# Patient Record
Sex: Male | Born: 1955 | Race: Black or African American | Hispanic: No | Marital: Single | Smoking: Former smoker
Health system: Southern US, Community
[De-identification: ages and names within clinical notes are randomized; demographics above are authoritative.]

## PROBLEM LIST (undated history)

## (undated) DIAGNOSIS — L89309 Pressure ulcer of unspecified buttock, unspecified stage: Secondary | ICD-10-CM

## (undated) DIAGNOSIS — I619 Nontraumatic intracerebral hemorrhage, unspecified: Secondary | ICD-10-CM

## (undated) DIAGNOSIS — U071 COVID-19: Secondary | ICD-10-CM

## (undated) DIAGNOSIS — E611 Iron deficiency: Secondary | ICD-10-CM

## (undated) DIAGNOSIS — Z931 Gastrostomy status: Secondary | ICD-10-CM

## (undated) DIAGNOSIS — K2100 Gastro-esophageal reflux disease with esophagitis, without bleeding: Secondary | ICD-10-CM

## (undated) DIAGNOSIS — IMO0002 Reserved for concepts with insufficient information to code with codable children: Secondary | ICD-10-CM

## (undated) DIAGNOSIS — I69122 Dysarthria following nontraumatic intracerebral hemorrhage: Secondary | ICD-10-CM

## (undated) DIAGNOSIS — F1090 Alcohol use, unspecified, uncomplicated: Secondary | ICD-10-CM

## (undated) DIAGNOSIS — I10 Essential (primary) hypertension: Secondary | ICD-10-CM

## (undated) DIAGNOSIS — R1312 Dysphagia, oropharyngeal phase: Secondary | ICD-10-CM

## (undated) DIAGNOSIS — Z720 Tobacco use: Secondary | ICD-10-CM

## (undated) DIAGNOSIS — Z789 Other specified health status: Secondary | ICD-10-CM

## (undated) DIAGNOSIS — B182 Chronic viral hepatitis C: Secondary | ICD-10-CM

## (undated) DIAGNOSIS — Z7289 Other problems related to lifestyle: Secondary | ICD-10-CM

## (undated) DIAGNOSIS — E1165 Type 2 diabetes mellitus with hyperglycemia: Secondary | ICD-10-CM

## (undated) DIAGNOSIS — G40823 Epileptic spasms, intractable, with status epilepticus: Secondary | ICD-10-CM

## (undated) DIAGNOSIS — M62838 Other muscle spasm: Secondary | ICD-10-CM

## (undated) DIAGNOSIS — R4701 Aphasia: Secondary | ICD-10-CM

## (undated) HISTORY — PX: FOOT FRACTURE SURGERY: SHX645

## (undated) NOTE — *Deleted (*Deleted)
Nutrition Follow-up  DOCUMENTATION CODES:   Severe malnutrition in context of social or environmental circumstances  INTERVENTION:    NUTRITION DIAGNOSIS:   Severe Malnutrition related to social / environmental circumstances as evidenced by severe fat depletion, severe muscle depletion.  ongoing  GOAL:   Patient will meet greater than or equal to 90% of their needs  Met with TF  MONITOR:   TF tolerance, Diet advancement, Weight trends, Skin  REASON FOR ASSESSMENT:   Consult, New TF Enteral/tube feeding initiation and management  ASSESSMENT:   34 yo male presents with acute onset of right sided weakness and mixed recept and expressive aphasia and admitted with acute left thalamocapsular ICH, severe HTN. PMH includes EtOH and tobacco abuse  10/06 Admitted 10/08 Cortrak placed(gastric) 10/13 repeat CT head showed stable L thalamic hemorrhageand intraventricular extension since 05/08/2020. Regional edema, mild regional mass-effect and mild lateral ventriculomegaly and trace SAH. 10/19 failed MBS 10/22 s/p PEG    Pt's hospital course has been complicated by recurrent blood loss anemia. GI following.   Pt continued to receive TF via PEG. Current orders are for Osmolite 1.2 @65ml /hr with free water Q4H. This provides 87 g of protein, 1872 kcals and 1264 mL of free water(2470ml total free water with flushes). Meets 100% estimated calorie and protein needs. Will continue with current nutrition plan of care and make adjustments as appropriate   UOP: x24 hours  Admit wt: 65.7 kg Current wt: 65.2 kg  Labs: CBGs 109-139 Medications: ss novolog, lantus 15 units daily, protonix, miralax, senokot-s  Diet Order:   Diet Order            DIET - DYS 1 Room service appropriate? No; Fluid consistency: Nectar Thick  Diet effective now                 EDUCATION NEEDS:   Not appropriate for education at this time  Skin:  Skin Assessment: Skin Integrity Issues:  Skin Integrity Issues:: Other (Comment) Other: puncture abdomen  Last BM:  11/1 type 7  Height:   Ht Readings from Last 1 Encounters:  04/09/20 5\' 6"  (1.676 m)    Weight:   Wt Readings from Last 1 Encounters:  06/02/20 65.2 kg    BMI:  Body mass index is 23.2 kg/m.  Estimated Nutritional Needs:   Kcal:  1780-1980 kcals  Protein:  85-95 g  Fluid:  >/= 1.8 L    ***

## (undated) NOTE — *Deleted (*Deleted)
STROKE TEAM PROGRESS NOTE   INTERVAL HISTORY RN at bedside.  Patient still has aphasia, left gaze and right hemiplegia.  CT repeat stable hematoma, however slightly increased midline shift and ventricular size.  We will keep patient in ICU today and repeat CT in a.m.  Vitals:   05/08/20 0300 05/08/20 0400 05/08/20 0500 05/08/20 0600  BP: (!) 157/60 (!) 163/60 (!) 160/60 (!) 148/64  Pulse: 69 71 93 70  Resp: 20 16 (!) 21 18  Temp:  (!) 100.6 F (38.1 C)    TempSrc:  Oral    SpO2: 98% 98% 97% 96%  Weight:   65.5 kg    CBC:  Recent Labs  Lab 05/05/20 2003 05/05/20 2003 05/06/20 0738 05/07/20 0337  WBC 8.8   < > 15.9* 16.2*  NEUTROABS 6.6  --  13.7*  --   HGB 12.3*   < > 11.6* 12.0*  HCT 40.8   < > 36.2* 38.3*  MCV 92.7   < > 91.6 92.1  PLT 149*   < > 154 136*   < > = values in this interval not displayed.   Basic Metabolic Panel:  Recent Labs  Lab 05/06/20 0738 05/07/20 0337 05/07/20 1012 05/07/20 1931  NA 137 136  --   --   K 3.9 3.4*  --   --   CL 106 104  --   --   CO2 20* 21*  --   --   GLUCOSE 151* 166*  --   --   BUN 10 9  --   --   CREATININE 0.97 0.81  --   --   CALCIUM 9.2 9.5  --   --   MG  --   --  1.6* 1.6*  PHOS  --   --  2.6 2.6   Lipid Panel:  Recent Labs  Lab 05/05/20 2003  CHOL 146  TRIG 87  HDL 49  CHOLHDL 3.0  VLDL 17  LDLCALC 80   HgbA1c:  Recent Labs  Lab 05/05/20 2004  HGBA1C 4.6*   Urine Drug Screen:  Recent Labs  Lab 05/06/20 0752  LABOPIA NONE DETECTED  COCAINSCRNUR POSITIVE*  LABBENZ NONE DETECTED  AMPHETMU NONE DETECTED  THCU NONE DETECTED  LABBARB NONE DETECTED    Alcohol Level  Recent Labs  Lab 05/05/20 2003  ETH <10    IMAGING past 24 hours CT HEAD WO CONTRAST  Result Date: 05/08/2020 CLINICAL DATA:  Follow-up examination for intracranial hemorrhage. EXAM: CT HEAD WITHOUT CONTRAST TECHNIQUE: Contiguous axial images were obtained from the base of the skull through the vertex without intravenous contrast.  COMPARISON:  Prior CT from 05/05/2020. FINDINGS: Brain: Examination moderately to severely degraded by motion artifact. Intraparenchymal hemorrhage centered at the left thalamus again seen, little interval changed in size and morphology as compared to previous exam. Surrounding low-density vasogenic edema with localized mass effect, similar. Associated left-to-right shift measures up to approximately 8 mm, little interval changed. Intraventricular extension with blood within the left greater than right lateral ventricles is overall similar. Associated ventricular size morphology is not significantly changed on this motion degraded exam. Scattered small volume subarachnoid hemorrhage now seen within the posterior right frontotemporal region, likely related to redistribution. No new acute intracranial hemorrhage. No acute large vessel territory infarct. No visible extra-axial fluid collection. No other new acute intracranial abnormality. Vascular: No appreciable hyperdense vessel or other abnormality on this motion degraded exam. Skull: Grossly stable allowing for motion. Sinuses/Orbits: Globes and orbital soft tissues  demonstrate no acute finding. Paranasal sinuses are clear. Nasogastric tube in place. Mastoid air cells are clear. Other: None. IMPRESSION: 1. Moderately to severely degraded exam due to motion artifact. 2. No significant interval changed in size and morphology of left thalamic hemorrhage with similar localized mass effect and 8 mm of left-to-right midline shift. 3. Similar intraventricular extension with blood within the left greater than right lateral ventricles. Associated ventricular size morphology not significantly changed on this motion degraded exam. 4. Scattered small volume subarachnoid hemorrhage within the posterior right frontotemporal region, likely related to redistribution. 5. No other new acute intracranial abnormality. Electronically Signed   By: Rise Mu M.D.   On:  05/08/2020 01:22   DG CHEST PORT 1 VIEW  Result Date: 05/07/2020 CLINICAL DATA:  Leukocytosis EXAM: PORTABLE CHEST 1 VIEW COMPARISON:  Portable exam 1322 hours compared to 05/05/2020 FINDINGS: Feeding tube extends into stomach. Normal heart size, mediastinal contours, and pulmonary vascularity. Atelectasis versus infiltrate at RIGHT base. Remaining lungs clear. No pleural effusion or pneumothorax. Bones demineralized. IMPRESSION: Atelectasis versus infiltrate at RIGHT lung base. Electronically Signed   By: Ulyses Southward M.D.   On: 05/07/2020 14:57    PHYSICAL EXAM   Temp:  [98.7 F (37.1 C)-101.6 F (38.7 C)] 100.6 F (38.1 C) (10/09 0400) Pulse Rate:  [25-101] 70 (10/09 0600) Resp:  [16-26] 18 (10/09 0600) BP: (121-173)/(50-111) 148/64 (10/09 0600) SpO2:  [83 %-99 %] 96 % (10/09 0600) Weight:  [65.5 kg] 65.5 kg (10/09 0500)  General - Well nourished, well developed, in no apparent distress.  Ophthalmologic - fundi not visualized due to noncooperation.  Cardiovascular - Regular rhythm and rate.  Neuro - drowsy sleepy but open eyes on voice. Global aphasia, able to make a few sounds but not words. Only followed commands of "eye opening" and "eye closing", no other commands. Not naming or repeating. Left gaze preference, abut able to cross midline. Right HH, not blinking to visual threat. PERRL, right upper and lower facial weakness. Tongue protrusion not cooperative. LUE at least 4/5 and LLE at least 3/5. However, RUE 0/5 mild withdraw to pain. RLE no spontaneous movement but withdraw to pain 3-/5. DTR 1+ and right positive babinski. Sensation, coordination not cooperative and gait not tested.   ASSESSMENT/PLAN [name] is a 25 y.o. male with history of alcohol and tobacco abuse presenting with RUE and RLE weakness, R sensory loss and mixed aphasia. SBP > 200. CT w/ L thalamocapsular IPH w/ IVH. Neuro worsening in ED w/ IVH and hydrocephalus. NS consulted for EVD consideration,  which was deferred.   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 Small vessel disease. Atrophy.   CT head 10/6 2042 enlarging L thalamocapsular IPH w/ increasing IVH from L ventricle into 3rd and 4th ventricles.  CT head 10/7 0510 stable L thalamic IPH w/ IVH w/ clearing of blood in 4th ventricle.   CTA head and neck 10/8 unremarkable angio, slight increase in midline shift and ventriculomegaly  MRI not able to perform due to no family contact to clear for metal  Repeat CT head 10/9 pending   2D Echo EF 60-65%  LDL 80  HgbA1c 4.6  UDS positive for cocaine  VTE prophylaxis - SCDs   No antithrombotic prior to admission, now on No antithrombotic given IPH  Therapy recommendations:  pending -  ok OOB  Disposition:  pending   Keep in ICU today, consider xfer tomorrow  Cerebral Edema  CT head 10/6 2042 enlarging L thalamocapsular IPH w/ increasing IVH from L ventricle into 3rd and 4th ventricles.  Neurosurgery consulted Yetta Barre) no indication for EVD. Following.  CT head 10/7 0510 stable L thalamic IPH w/ IVH w/ clearing of blood in 4th ventricle.    CTA head and neck 10/8 unremarkable angio, slight increase in midline shift and ventriculomegaly  CT head repeat 10/9 pending   Hypertensive Emergency  Per EMS, SBP > 200   Home meds:  none  Put on Cleviprex for BP control -> now off  Hydralazine IV PRN  On norvasc 10 and lisinopril 20 bid  SBP goal < 160 . Long-term BP goal normotensive  Leukocytosis and fever  WBC 8.8->15.9->16.2  Tmax afebrile -> 101.3  UA neg  CXR mild cardiomegaly w/ mild central congestion, suspicion mild interstitial edema   Blood culture pending  Hyperlipidemia  Home meds:  None  LDL 80, goal < 70  No statin in setting of IPH  Mild transaminitis, likely related to alcohol, AST/ALP 68/64->52/47   Consider statin low dose on discharge with normalized liver enzymes  Dysphagia . Secondary to stroke . NPO . On IVF @ 30 . cortrak placed . On TF @ 65 . Speech on board   Tobacco abuse  Current smoker  Smoking cessation counseling will be provided  Cocaine abuse  UDS positive for cocaine  Cocaine cessation will be provided  Avoid beta-blocker  Other Stroke Risk Factors  ETOH use, alcohol level <10, advised to drink no more than 2 drink(s) a day  Other Active Problems    Hospital day # 3  This patient is critically ill due to fever and leukocytosis, dysphagia, hypertensive emergency, left BG ICH and IVH and at significant risk of neurological worsening, death form cerebral edema, brain herniation, obstructive hydrocephalus, sepsis, seizure, hypertensive encephalopathy. This patient's care requires constant monitoring of vital signs, hemodynamics, respiratory and cardiac monitoring, review of multiple databases, neurological assessment, discussion with family, other specialists and medical decision making of high complexity. I spent 35 minutes of neurocritical care time in the care of this patient.      To contact Stroke Continuity provider, please refer to [email or web address]. After hours, contact General Neurology

---

## 2005-12-31 ENCOUNTER — Emergency Department: Payer: Self-pay | Admitting: Emergency Medicine

## 2006-09-12 ENCOUNTER — Emergency Department: Payer: Self-pay | Admitting: Emergency Medicine

## 2015-05-08 ENCOUNTER — Encounter: Payer: Self-pay | Admitting: *Deleted

## 2015-05-08 ENCOUNTER — Emergency Department
Admission: EM | Admit: 2015-05-08 | Discharge: 2015-05-08 | Disposition: A | Discharge status: Home / Self Care | Payer: Self-pay | Attending: Emergency Medicine | Admitting: Emergency Medicine

## 2015-05-08 DIAGNOSIS — H6123 Impacted cerumen, bilateral: Secondary | ICD-10-CM | POA: Insufficient documentation

## 2015-05-08 DIAGNOSIS — H6093 Unspecified otitis externa, bilateral: Secondary | ICD-10-CM | POA: Insufficient documentation

## 2015-05-08 DIAGNOSIS — Z720 Tobacco use: Secondary | ICD-10-CM | POA: Insufficient documentation

## 2015-05-08 MED ORDER — OFLOXACIN 0.3 % OT SOLN: 10.0000 [drp] | Freq: Every day | OTIC | Status: AC

## 2015-05-08 NOTE — ED Provider Notes (Addendum)
[facility] Emergency Department Provider Note  ____________________________________________  Time seen: Approximately 4:20 AM  I have reviewed the triage vital signs and the nursing notes.   HISTORY  Chief Complaint Hearing Problem    HPI Alan Everett is a 59 y.o. male who is presenting today with 1 week of worsening hearing loss bilaterally. He denies any other symptoms such as ringing in his ears. Denies any cough or rhinorrhea. Has never had a cerumen impaction in the past.   Patient denies any past medical history.  There are no active problems to display for this patient.   No past surgical history on file.  No current outpatient prescriptions on file.  Allergies Review of patient's allergies indicates no known allergies.  No family history on file.  Social History Social History  Substance Use Topics  . Smoking status: Current Every Day Smoker  . Smokeless tobacco: Not on file  . Alcohol Use: Yes    Review of Systems Constitutional: No fever/chills Eyes: No visual changes. ENT: No sore throat. Cardiovascular: Denies chest pain. Respiratory: Denies shortness of breath. Gastrointestinal: No abdominal pain.  No nausea, no vomiting.  No diarrhea.  No constipation. Genitourinary: Negative for dysuria. Musculoskeletal: Negative for back pain. Skin: Negative for rash. Neurological: Negative for headaches, focal weakness or numbness.  10-point ROS otherwise negative.  ____________________________________________   PHYSICAL EXAM:  VITAL SIGNS: ED Triage Vitals  Enc Vitals Group     BP 05/08/15 0116 183/77 mmHg     Pulse Rate 05/08/15 0116 82     Resp 05/08/15 0116 20     Temp 05/08/15 0116 98.1 F (36.7 C)     Temp Source 05/08/15 0116 Oral     SpO2 05/08/15 0116 98 %     Weight 05/08/15 0116 146 lb (66.225 kg)     Height 05/08/15 0116  (1.676 m)     Head Cir --      Peak Flow --      Pain Score --      Pain Loc --       Pain Edu? --      Excl. in GC? --     Constitutional: Alert and oriented. Well appearing and in no acute distress. Eyes: Conjunctivae are normal. PERRL. EOMI. Head: Atraumatic. Completely occluded external canals bilaterally with cerumen. Nose: No congestion/rhinnorhea. Mouth/Throat: Mucous membranes are moist.  Oropharynx non-erythematous. Neck: No stridor.   Cardiovascular: Normal rate, regular rhythm. Grossly normal heart sounds.  Good peripheral circulation. Respiratory: Normal respiratory effort.  No retractions. Lungs CTAB. Gastrointestinal: Soft and nontender. No distention. No abdominal bruits. No CVA tenderness. Musculoskeletal: No lower extremity tenderness nor edema.  No joint effusions. Neurologic:  Normal speech and language. No gross focal neurologic deficits are appreciated. No gait instability. Skin:  Skin is warm, dry and intact. No rash noted. Psychiatric: Mood and affect are normal. Speech and behavior are normal.  ____________________________________________   LABS (all labs ordered are listed, but only abnormal results are displayed)  Labs Reviewed - No data to display ____________________________________________  EKG   ____________________________________________  RADIOLOGY   ____________________________________________   PROCEDURES  Ceruminosis is noted.  Wax is removed by syringing and manual debridement.  Mild bleeding to the left canal after sermon removed. Both TMs are intact and normal-appearing. Mild irritation to the skin bilaterally in the canals. Instructions for home care to prevent wax buildup are given.   ____________________________________________   INITIAL IMPRESSION / ASSESSMENT AND PLAN / ED  COURSE  Pertinent labs & imaging results that were available during my care of the patient were reviewed by me and considered in my medical decision making (see chart for details).  Patient says hearing is completely restored after  bilateral cerumen disimpaction. We'll give eardrops secondary to mild otitis externa likely secondary to cerumen buildup. We'll discharge to home. Discussed use of Debrox at home to prevent further buildup. ____________________________________________   FINAL CLINICAL IMPRESSION(S) / ED DIAGNOSES  A lateral cerumen impaction with otitis externa.    Myrna Blazer, MD 05/08/15 1191  Myrna Blazer, MD 05/08/15 [phone]

## 2015-05-08 NOTE — ED Notes (Signed)
Pt reports difficulty hearing for 1 week.  No ringing of ears  No headache.  No earache.  Pt alert.  Speech clear.

## 2015-05-08 NOTE — Discharge Instructions (Signed)
You may purchase Debrox over the counter wax removal kit at your local pharmacy.   Cerumen Impaction The structures of the external ear canal secrete a waxy substance known as cerumen. Excess cerumen can build up in the ear canal, causing a condition known as cerumen impaction. Cerumen impaction can cause ear pain and disrupt the function of the ear. The rate of cerumen production differs for each individual. In certain individuals, the configuration of the ear canal may decrease his or her ability to naturally remove cerumen. CAUSES Cerumen impaction is caused by excessive cerumen production or buildup. RISK FACTORS  Frequent use of swabs to clean ears.  Having narrow ear canals.  Having eczema.  Being dehydrated. SIGNS AND SYMPTOMS  Diminished hearing.  Ear drainage.  Ear pain.  Ear itch. TREATMENT Treatment may involve:  Over-the-counter or prescription ear drops to soften the cerumen.  Removal of cerumen by a health care provider. This may be done with:  Irrigation with warm water. This is the most common method of removal.  Ear curettes and other instruments.  Surgery. This may be done in severe cases. HOME CARE INSTRUCTIONS  Take medicines only as directed by your health care provider.  Do not insert objects into the ear with the intent of cleaning the ear. PREVENTION  Do not insert objects into the ear, even with the intent of cleaning the ear. Removing cerumen as a part of normal hygiene is not necessary, and the use of swabs in the ear canal is not recommended.  Drink enough water to keep your urine clear or pale yellow.  Control your eczema if you have it. SEEK MEDICAL CARE IF:  You develop ear pain.  You develop bleeding from the ear.  The cerumen does not clear after you use ear drops as directed.   This information is not intended to replace advice given to you by your health care provider. Make sure you discuss any questions you have with your  health care provider.   Document Released: 08/24/2004 Document Revised: 08/07/2014 Document Reviewed: 03/03/2015 Elsevier Interactive Patient Education 2016 [employer]  Otitis Externa Otitis externa is a bacterial or fungal infection of the outer ear canal. This is the area from the eardrum to the outside of the ear. Otitis externa is sometimes called "swimmer's ear." CAUSES  Possible causes of infection include:  Swimming in dirty water.  Moisture remaining in the ear after swimming or bathing.  Mild injury (trauma) to the ear.  Objects stuck in the ear (foreign body).  Cuts or scrapes (abrasions) on the outside of the ear. SIGNS AND SYMPTOMS  The first symptom of infection is often itching in the ear canal. Later signs and symptoms may include swelling and redness of the ear canal, ear pain, and yellowish-white fluid (pus) coming from the ear. The ear pain may be worse when pulling on the earlobe. DIAGNOSIS  Your health care provider will perform a physical exam. A sample of fluid may be taken from the ear and examined for bacteria or fungi. TREATMENT  Antibiotic ear drops are often given for 10 to 14 days. Treatment may also include pain medicine or corticosteroids to reduce itching and swelling. HOME CARE INSTRUCTIONS   Apply antibiotic ear drops to the ear canal as prescribed by your health care provider.  Take medicines only as directed by your health care provider.  If you have diabetes, follow any additional treatment instructions from your health care provider.  Keep all follow-up visits as directed  by your health care provider. PREVENTION   Keep your ear dry. Use the corner of a towel to absorb water out of the ear canal after swimming or bathing.  Avoid scratching or putting objects inside your ear. This can damage the ear canal or remove the protective wax that lines the canal. This makes it easier for bacteria and fungi to grow.  Avoid swimming in lakes,  polluted water, or poorly chlorinated pools.  You may use ear drops made of rubbing alcohol and vinegar after swimming. Combine equal parts of white vinegar and alcohol in a bottle. Put 3 or 4 drops into each ear after swimming. SEEK MEDICAL CARE IF:   You have a fever.  Your ear is still red, swollen, painful, or draining pus after 3 days.  Your redness, swelling, or pain gets worse.  You have a severe headache.  You have redness, swelling, pain, or tenderness in the area behind your ear. MAKE SURE YOU:   Understand these instructions.  Will watch your condition.  Will get help right away if you are not doing well or get worse.   This information is not intended to replace advice given to you by your health care provider. Make sure you discuss any questions you have with your health care provider.   Document Released: 07/17/2005 Document Revised: 08/07/2014 Document Reviewed: 08/03/2011 Elsevier Interactive Patient Education [employer].

## 2015-05-08 NOTE — ED Notes (Signed)
Pt reports decreased hearing in his left ear. Pt reports it has been decreasing x 1 week.

## 2019-07-10 ENCOUNTER — Other Ambulatory Visit: Payer: Self-pay

## 2019-07-10 DIAGNOSIS — Z20822 Contact with and (suspected) exposure to covid-19: Secondary | ICD-10-CM

## 2019-07-12 LAB — NOVEL CORONAVIRUS, NAA: SARS-CoV-2, NAA: NOT DETECTED

## 2020-02-28 ENCOUNTER — Other Ambulatory Visit: Payer: Self-pay

## 2020-02-28 ENCOUNTER — Emergency Department: Payer: Self-pay

## 2020-02-28 ENCOUNTER — Encounter: Payer: Self-pay | Admitting: Emergency Medicine

## 2020-02-28 ENCOUNTER — Inpatient Hospital Stay
Admission: EM | Admit: 2020-02-28 | Discharge: 2020-03-01 | DRG: 811 | Disposition: A | Discharge status: Home / Self Care | Payer: Self-pay | Attending: Internal Medicine | Admitting: Internal Medicine

## 2020-02-28 DIAGNOSIS — Z20822 Contact with and (suspected) exposure to covid-19: Secondary | ICD-10-CM | POA: Diagnosis present

## 2020-02-28 DIAGNOSIS — Z809 Family history of malignant neoplasm, unspecified: Secondary | ICD-10-CM

## 2020-02-28 DIAGNOSIS — Z7289 Other problems related to lifestyle: Secondary | ICD-10-CM

## 2020-02-28 DIAGNOSIS — Z833 Family history of diabetes mellitus: Secondary | ICD-10-CM

## 2020-02-28 DIAGNOSIS — Z791 Long term (current) use of non-steroidal anti-inflammatories (NSAID): Secondary | ICD-10-CM

## 2020-02-28 DIAGNOSIS — D509 Iron deficiency anemia, unspecified: Secondary | ICD-10-CM | POA: Diagnosis present

## 2020-02-28 DIAGNOSIS — Z789 Other specified health status: Secondary | ICD-10-CM

## 2020-02-28 DIAGNOSIS — R519 Headache, unspecified: Secondary | ICD-10-CM | POA: Diagnosis present

## 2020-02-28 DIAGNOSIS — K2971 Gastritis, unspecified, with bleeding: Secondary | ICD-10-CM | POA: Diagnosis present

## 2020-02-28 DIAGNOSIS — E611 Iron deficiency: Secondary | ICD-10-CM

## 2020-02-28 DIAGNOSIS — D500 Iron deficiency anemia secondary to blood loss (chronic): Principal | ICD-10-CM | POA: Diagnosis present

## 2020-02-28 DIAGNOSIS — Z79899 Other long term (current) drug therapy: Secondary | ICD-10-CM

## 2020-02-28 DIAGNOSIS — F1010 Alcohol abuse, uncomplicated: Secondary | ICD-10-CM | POA: Diagnosis present

## 2020-02-28 DIAGNOSIS — D649 Anemia, unspecified: Secondary | ICD-10-CM | POA: Diagnosis present

## 2020-02-28 DIAGNOSIS — K5731 Diverticulosis of large intestine without perforation or abscess with bleeding: Secondary | ICD-10-CM | POA: Diagnosis present

## 2020-02-28 DIAGNOSIS — R195 Other fecal abnormalities: Secondary | ICD-10-CM

## 2020-02-28 DIAGNOSIS — R718 Other abnormality of red blood cells: Secondary | ICD-10-CM | POA: Diagnosis present

## 2020-02-28 DIAGNOSIS — Z8371 Family history of colonic polyps: Secondary | ICD-10-CM

## 2020-02-28 DIAGNOSIS — F1090 Alcohol use, unspecified, uncomplicated: Secondary | ICD-10-CM

## 2020-02-28 DIAGNOSIS — Z720 Tobacco use: Secondary | ICD-10-CM | POA: Diagnosis present

## 2020-02-28 DIAGNOSIS — F17210 Nicotine dependence, cigarettes, uncomplicated: Secondary | ICD-10-CM | POA: Diagnosis present

## 2020-02-28 HISTORY — DX: Other specified health status: Z78.9

## 2020-02-28 HISTORY — DX: Alcohol use, unspecified, uncomplicated: F10.90

## 2020-02-28 HISTORY — DX: Other problems related to lifestyle: Z72.89

## 2020-02-28 HISTORY — DX: Tobacco use: Z72.0

## 2020-02-28 LAB — CBC
HCT: 13.4 % — CL (ref 39.0–52.0)
HCT: 13.1 % — CL (ref 39.0–52.0)
HCT: 13 % — CL (ref 39.0–52.0)
HCT: 17.4 % — ABNORMAL LOW (ref 39.0–52.0)
Hemoglobin: 3.5 g/dL — CL (ref 13.0–17.0)
Hemoglobin: 3.4 g/dL — CL (ref 13.0–17.0)
Hemoglobin: 3.4 g/dL — CL (ref 13.0–17.0)
Hemoglobin: 4.9 g/dL — CL (ref 13.0–17.0)
MCH: 17.3 pg — ABNORMAL LOW (ref 26.0–34.0)
MCH: 17.1 pg — ABNORMAL LOW (ref 26.0–34.0)
MCH: 16.9 pg — ABNORMAL LOW (ref 26.0–34.0)
MCH: 19.8 pg — ABNORMAL LOW (ref 26.0–34.0)
MCHC: 26.1 g/dL — ABNORMAL LOW (ref 30.0–36.0)
MCHC: 26 g/dL — ABNORMAL LOW (ref 30.0–36.0)
MCHC: 26.2 g/dL — ABNORMAL LOW (ref 30.0–36.0)
MCHC: 28.2 g/dL — ABNORMAL LOW (ref 30.0–36.0)
MCV: 66.3 fL — ABNORMAL LOW (ref 80.0–100.0)
MCV: 65.8 fL — ABNORMAL LOW (ref 80.0–100.0)
MCV: 64.7 fL — ABNORMAL LOW (ref 80.0–100.0)
MCV: 70.2 fL — ABNORMAL LOW (ref 80.0–100.0)
Platelets: 279 10*3/uL (ref 150–400)
Platelets: 254 10*3/uL (ref 150–400)
Platelets: 219 10*3/uL (ref 150–400)
Platelets: 256 10*3/uL (ref 150–400)
RBC: 2.02 MIL/uL — ABNORMAL LOW (ref 4.22–5.81)
RBC: 1.99 MIL/uL — ABNORMAL LOW (ref 4.22–5.81)
RBC: 2.01 MIL/uL — ABNORMAL LOW (ref 4.22–5.81)
RBC: 2.48 MIL/uL — ABNORMAL LOW (ref 4.22–5.81)
RDW: 20.6 % — ABNORMAL HIGH (ref 11.5–15.5)
RDW: 21.2 % — ABNORMAL HIGH (ref 11.5–15.5)
RDW: 20.8 % — ABNORMAL HIGH (ref 11.5–15.5)
RDW: 22.8 % — ABNORMAL HIGH (ref 11.5–15.5)
WBC: 5.5 10*3/uL (ref 4.0–10.5)
WBC: 5 10*3/uL (ref 4.0–10.5)
WBC: 4.1 10*3/uL (ref 4.0–10.5)
WBC: 5.2 10*3/uL (ref 4.0–10.5)
nRBC: 0 % (ref 0.0–0.2)
nRBC: 0 % (ref 0.0–0.2)
nRBC: 0 % (ref 0.0–0.2)
nRBC: 0 % (ref 0.0–0.2)

## 2020-02-28 LAB — BASIC METABOLIC PANEL
Anion gap: 11 (ref 5–15)
BUN: 12 mg/dL (ref 8–23)
CO2: 21 mmol/L — ABNORMAL LOW (ref 22–32)
Calcium: 9.1 mg/dL (ref 8.9–10.3)
Chloride: 107 mmol/L (ref 98–111)
Creatinine, Ser: 1.03 mg/dL (ref 0.61–1.24)
GFR calc Af Amer: >60 mL/min (ref >60)
GFR calc non Af Amer: >60 mL/min (ref >60)
Glucose, Bld: 103 mg/dL — ABNORMAL HIGH (ref 70–99)
Potassium: 3.8 mmol/L (ref 3.5–5.1)
Sodium: 139 mmol/L (ref 135–145)

## 2020-02-28 LAB — LACTATE DEHYDROGENASE: LDH: 113 U/L (ref 98–192)

## 2020-02-28 LAB — SARS CORONAVIRUS 2 BY RT PCR (HOSPITAL ORDER, PERFORMED IN ~~LOC~~ HOSPITAL LAB): SARS Coronavirus 2: NEGATIVE

## 2020-02-28 LAB — ABO/RH: ABO/RH(D): O POS

## 2020-02-28 LAB — APTT: aPTT: 31 seconds (ref 24–36)

## 2020-02-28 LAB — MAGNESIUM: Magnesium: 1.9 mg/dL (ref 1.7–2.4)

## 2020-02-28 LAB — PROTIME-INR
INR: 1.1 (ref 0.8–1.2)
Prothrombin Time: 14.2 seconds (ref 11.4–15.2)

## 2020-02-28 LAB — PREPARE RBC (CROSSMATCH)

## 2020-02-28 LAB — SAVE SMEAR(SSMR), FOR PROVIDER SLIDE REVIEW

## 2020-02-28 LAB — TROPONIN I (HIGH SENSITIVITY)
Troponin I (High Sensitivity): 7 ng/L (ref <18)
Troponin I (High Sensitivity): 6 ng/L (ref <18)

## 2020-02-28 MED ORDER — ACETAMINOPHEN 650 MG RE SUPP: 650.0000 mg | Freq: Four times a day (QID) | RECTAL | Status: DC | PRN

## 2020-02-28 MED ORDER — THIAMINE HCL 100 MG PO TABS
100.0000 mg | ORAL_TABLET | Freq: Every day | ORAL | Status: DC
  Administered 2020-02-29: 100 mg via ORAL
  Filled 2020-02-28 (×2): qty 1

## 2020-02-28 MED ORDER — LORAZEPAM 2 MG/ML IJ SOLN: 0.0000 mg | Freq: Four times a day (QID) | INTRAMUSCULAR | Status: AC

## 2020-02-28 MED ORDER — PANTOPRAZOLE SODIUM 40 MG IV SOLR
40.0000 mg | Freq: Two times a day (BID) | INTRAVENOUS | Status: DC
  Administered 2020-02-28 – 2020-03-01 (×4): 40 mg via INTRAVENOUS
  Filled 2020-02-28 (×4): qty 40

## 2020-02-28 MED ORDER — SODIUM CHLORIDE 0.9 % IV BOLUS
1000.0000 mL | Freq: Once | INTRAVENOUS | Status: AC
  Administered 2020-02-28: 1000 mL via INTRAVENOUS

## 2020-02-28 MED ORDER — SODIUM CHLORIDE 0.9% FLUSH: 3.0000 mL | Freq: Once | INTRAVENOUS | Status: DC

## 2020-02-28 MED ORDER — PANTOPRAZOLE SODIUM 40 MG IV SOLR
40.0000 mg | Freq: Once | INTRAVENOUS | Status: AC
  Administered 2020-02-28: 40 mg via INTRAVENOUS
  Filled 2020-02-28: qty 40

## 2020-02-28 MED ORDER — LORAZEPAM 2 MG/ML IJ SOLN: 1.0000 mg | INTRAMUSCULAR | Status: DC | PRN

## 2020-02-28 MED ORDER — LORAZEPAM 1 MG PO TABS: 1.0000 mg | ORAL_TABLET | ORAL | Status: DC | PRN

## 2020-02-28 MED ORDER — ONDANSETRON HCL 4 MG/2ML IJ SOLN: 4.0000 mg | Freq: Four times a day (QID) | INTRAMUSCULAR | Status: DC | PRN

## 2020-02-28 MED ORDER — ONDANSETRON HCL 4 MG PO TABS: 4.0000 mg | ORAL_TABLET | Freq: Four times a day (QID) | ORAL | Status: DC | PRN

## 2020-02-28 MED ORDER — FOLIC ACID 1 MG PO TABS
1.0000 mg | ORAL_TABLET | Freq: Every day | ORAL | Status: DC
  Administered 2020-02-28 – 2020-02-29 (×2): 1 mg via ORAL
  Filled 2020-02-28 (×2): qty 1

## 2020-02-28 MED ORDER — THIAMINE HCL 100 MG/ML IJ SOLN
100.0000 mg | Freq: Every day | INTRAMUSCULAR | Status: DC
  Administered 2020-02-28 – 2020-03-01 (×2): 100 mg via INTRAVENOUS
  Filled 2020-02-28 (×2): qty 2

## 2020-02-28 MED ORDER — SODIUM CHLORIDE 0.9 % IV SOLN: 10.0000 mL/h | Freq: Once | INTRAVENOUS | Status: DC

## 2020-02-28 MED ORDER — ADULT MULTIVITAMIN W/MINERALS CH
1.0000 | ORAL_TABLET | Freq: Every day | ORAL | Status: DC
  Administered 2020-02-28 – 2020-02-29 (×2): 1 via ORAL
  Filled 2020-02-28 (×2): qty 1

## 2020-02-28 MED ORDER — NICOTINE 21 MG/24HR TD PT24
21.0000 mg | MEDICATED_PATCH | Freq: Every day | TRANSDERMAL | Status: DC
  Filled 2020-02-28: qty 1

## 2020-02-28 MED ORDER — LORAZEPAM 2 MG/ML IJ SOLN: 0.0000 mg | Freq: Two times a day (BID) | INTRAMUSCULAR | Status: DC

## 2020-02-28 MED ORDER — ACETAMINOPHEN 325 MG PO TABS
650.0000 mg | ORAL_TABLET | Freq: Four times a day (QID) | ORAL | Status: DC | PRN
  Administered 2020-02-29: 650 mg via ORAL
  Filled 2020-02-28: qty 2

## 2020-02-28 NOTE — ED Notes (Signed)
Attempted to call report, per receiving RN she is finishing up with a patient currently and will call back in 5 min

## 2020-02-28 NOTE — ED Triage Notes (Signed)
Pt presents to ED via POV with c/o dizziness, nausea, and SOB x 1 week. Pt able to speak in full and complete sentences at this time. NAD noted at this time. Pt denies pain upon arrival to ED.

## 2020-02-28 NOTE — ED Notes (Signed)
Pt reports no infusion reaction symptoms, reports feeling "about the same" as pre-transfusion

## 2020-02-28 NOTE — ED Provider Notes (Signed)
[facility] Emergency Department Provider Note   ____________________________________________   First MD Initiated Contact with Patient 02/28/20 1049     (approximate)  I have reviewed the triage vital signs and the nursing notes.   HISTORY  Chief Complaint Dizziness and Shortness of Breath    HPI Alan Everett is a 64 y.o. male reports for a little over a week now has been experiencing a feeling of lightheadedness when he is working walking and doing things.  He is also started noticed that he started to feel little short of breath.  Throughout the day yesterday at work he just felt lightheaded this entire day.  He came today for evaluation as he just continues to feel very lightheaded with exerting himself  Takes no medications and has no medical history never been hospitalized, except he will occasionally take Tylenol when he is having aches and pains  Denies any fevers or chills.  No recent illness except he had coronavirus at the beginning he reports of roughly April  No chest pain.  Feeling some short of breath or feeling of shortness of breath over the last week specially exert himself     Past Medical History:  Diagnosis Date  . Tobacco abuse     Patient Active Problem List   Diagnosis Date Noted  . Symptomatic anemia 02/28/2020  . Microcytic anemia 02/28/2020  . Tobacco abuse     History reviewed. No pertinent surgical history.  Prior to Admission medications   Not on File    Allergies Patient has no known allergies.  History reviewed. No pertinent family history.  Social History Social History   Tobacco Use  . Smoking status: Current Every Day Smoker  . Smokeless tobacco: Never Used  Substance Use Topics  . Alcohol use: Yes  . Drug use: Not on file    Review of Systems Constitutional: No fever/chills Eyes: No visual changes. ENT: No sore throat. Cardiovascular: Denies chest pain. Respiratory: see  HPI Gastrointestinal: No abdominal pain.  Denies any vomiting or black or bloody stool, no diarrhea Genitourinary: Negative for dysuria. Musculoskeletal: Negative for back pain. Skin: Negative for rash. Neurological: Negative for headaches or focal weakness.  Denies history of taking any blood thinners.  No history of easy bleeding.  No history of sickle cell  ____________________________________________   PHYSICAL EXAM:  VITAL SIGNS: ED Triage Vitals  Enc Vitals Group     BP 02/28/20 1011 (!) 134/56     Pulse Rate 02/28/20 1011 93     Resp 02/28/20 1011 17     Temp 02/28/20 1011 98.1 F (36.7 C)     Temp Source 02/28/20 1011 Oral     SpO2 02/28/20 1011 100 %     Weight --      Height --      Head Circumference --      Peak Flow --      Pain Score 02/28/20 0952 0     Pain Loc --      Pain Edu? --      Excl. in GC? --     Constitutional: Alert and oriented. Well appearing and in no acute distress. Eyes: Conjunctivae are normal. Head: Atraumatic. Nose: No congestion/rhinnorhea. Mouth/Throat: Mucous membranes are moist. Neck: No stridor.  Cardiovascular: Normal rate, regular rhythm. Grossly normal heart sounds.  Good peripheral circulation. Respiratory: Normal respiratory effort.  No retractions. Lungs CTAB. Gastrointestinal: Soft and nontender. No distention. Rectal exam, escorted by nurse noah (who affirms test  result).  Patient has brown stool but it is trace heme positive with positive control Musculoskeletal: No lower extremity tenderness nor edema. Neurologic:  Normal speech and language. No gross focal neurologic deficits are appreciated.  Skin:  Skin is warm, dry and intact. No rash noted. Psychiatric: Mood and affect are normal. Speech and behavior are normal.  ____________________________________________   LABS (all labs ordered are listed, but only abnormal results are displayed)  Labs Reviewed  BASIC METABOLIC PANEL - Abnormal; Notable for the following  components:      Result Value   CO2 21 (*)    Glucose, Bld 103 (*)    All other components within normal limits  CBC - Abnormal; Notable for the following components:   RBC 2.02 (*)    Hemoglobin 3.5 (*)    HCT 13.4 (*)    MCV 66.3 (*)    MCH 17.3 (*)    MCHC 26.1 (*)    RDW 20.6 (*)    All other components within normal limits  CBC - Abnormal; Notable for the following components:   RBC 1.99 (*)    Hemoglobin 3.4 (*)    HCT 13.1 (*)    MCV 65.8 (*)    MCH 17.1 (*)    MCHC 26.0 (*)    RDW 21.2 (*)    All other components within normal limits  SARS CORONAVIRUS 2 BY RT PCR (HOSPITAL ORDER, PERFORMED IN  HOSPITAL LAB)  SAVE SMEAR (SSMR)  PATHOLOGIST SMEAR REVIEW  LACTATE DEHYDROGENASE  CBC  CBC  CBC  PROTIME-INR  APTT  VITAMIN B12  FOLATE  IRON AND TIBC  FERRITIN  RETICULOCYTES  PREPARE RBC (CROSSMATCH)  TYPE AND SCREEN  ABO/RH  TROPONIN I (HIGH SENSITIVITY)  TROPONIN I (HIGH SENSITIVITY)   ____________________________________________  EKG  Reviewed notes by me at 1015 Heart rate 95 QRS 80 QTc 450 Normal sinus rhythm, no evidence of acute ischemia or ectopy. ____________________________________________  RADIOLOGY   ____________________________________________   PROCEDURES  Procedure(s) performed: None  Procedures  Critical Care performed: Yes, see critical care note(s)  CRITICAL CARE Performed by: Sharyn Creamer   Total critical care time: 30 minutes  Critical care time was exclusive of separately billable procedures and treating other patients.  Critical care was necessary to treat or prevent imminent or life-threatening deterioration.  Critical care was time spent personally by me on the following activities: development of treatment plan with patient and/or surrogate as well as nursing, discussions with consultants, evaluation of patient's response to treatment, examination of patient, obtaining history from patient or surrogate,  ordering and performing treatments and interventions, ordering and review of laboratory studies, ordering and review of radiographic studies, pulse oximetry and re-evaluation of patient's condition.  ____________________________________________   INITIAL IMPRESSION / ASSESSMENT AND PLAN / ED COURSE  Pertinent labs & imaging results that were available during my care of the patient were reviewed by me and considered in my medical decision making (see chart for details).   Patient presents with lightheadedness and exertional dyspnea.  Found to have severe anemia on laboratory testing.  His presentation clinically he is alert and hemodynamics are very stable, but he does have faint heme positive stool.  Denies heavy use of NSAIDs but does occasionally take medications like Tylenol or over-the-counter pain medicines for aches and pains.  He has no history of bleeding disorder and denies any black or bloody stool.  I do suspect however he is likely had a somewhat occult GI bleeding.  No associated abdominal pain  Patient verbally consented to blood transfusion, 2 units of blood ordered stat.  Patient will require admission to the hospital for further work-up and management with anticipation of likely need for GI consultation moving forward.  Patient understanding agreeable with plan  He does report that he had Covid roughly 3 months ago not quite sure on the timeline, we will test here but he denies and exhibits no Covid-like symptoms at this time  Kwaku Mostafa was evaluated in Emergency Department on 02/28/2020 for the symptoms described in the history of present illness. He was evaluated in the context of the global COVID-19 pandemic, which necessitated consideration that the patient might be at risk for infection with the SARS-CoV-2 virus that causes COVID-19. Institutional protocols and algorithms that pertain to the evaluation of patients at risk for COVID-19 are in a state of rapid change based on  information released by regulatory bodies including the CDC and federal and state organizations. These policies and algorithms were followed during the patient's care in the ED.     ----------------------------------------- 12:36 PM on 02/28/2020 -----------------------------------------  Patient reevaluated, resting comfortably.  He is understanding of plan for blood transfusion when she has been verbally consented to a nursing obtaining written consent as well.  Blood transfusion has been ordered, case discussed with hospitalist who will be admitting doctor Clyde Lundborg  ____________________________________________   FINAL CLINICAL IMPRESSION(S) / ED DIAGNOSES  Final diagnoses:  Symptomatic anemia  Heme + stool        Note:  This document was prepared using Dragon voice recognition software and may include unintentional dictation errors       Sharyn Creamer, MD 02/28/20 1237

## 2020-02-28 NOTE — ED Provider Notes (Addendum)
Lab result just came across with "Schistocytes present". Dr. Clyde Lundborg advises he is aware and will be consulting heme-onc.   I do not see clinical or strong lab evidence of HUS, TTP. Patient will clearly need additional work-up. Per Dr. Racheal Patches (heme-onc) 'labs- not suggestive of hemolysis; not suggestive of MAHA- TTP. Appears chronic iron deficiency.'    Sharyn Creamer, MD 02/28/20 1415    Sharyn Creamer, MD 02/28/20 1557

## 2020-02-28 NOTE — H&P (Signed)
History and Physical    Alan Everett PPJ:[id number] DOB: 1956/04/17 DOA: 02/28/2020  Referring MD/NP/PA:   PCP: Patient, No Pcp Per   Patient coming from:  The patient is coming from home.  At baseline, pt is independent for most of ADL.        Chief Complaint: Shortness of breath, lightheadedness  HPI: Alan Everett is a 64 y.o. male without significant past medical history except for tobacco abuse and alcohol use, who presents with shortness of breath and lightheadedness.  Patient states that he has been having shortness breath, dizziness, lightheadedness for about 1 week, which has been progressively worsening.  Patient does not have cough, chest pain, fever or chills.  He has nausea, but no vomiting, diarrhea or abdominal pain.  No symptoms of UTI or unilateral weakness.  Denies dark stool or bloody stool. Patient states that he is generally healthy, not taking prescribed medications.  He occasionally take Tylenol and over-the-counter pain medications. He never had an EGD or colonoscopy before. His partner reported that the patient drinks beers almost every day, some times he drinks 6 beers at one time.   ED Course: pt was found to have hemoglobin 3.5 --> 3.4, troponin 7 positive FOBT, pending COVID-19 PCR, electrolytes renal function okay, temperature normal, blood pressure 140/60, heart rate 86, RR 18, oxygen saturation 100% on room air.  Chest x-ray negative.  Patient is placed on progressive bed for observation.  Gastroenterology, Dr. Norma Fredrickson is consulted.  Review of Systems:   General: no fevers, chills, no body weight gain, has fatigue HEENT: no blurry vision, hearing changes or sore throat Respiratory: has dyspnea, no coughing, wheezing CV: no chest pain, no palpitations GI: has nausea, no vomiting, abdominal pain, diarrhea, constipation GU: no dysuria, burning on urination, increased urinary frequency, hematuria  Ext: no leg edema Neuro: no unilateral weakness, numbness, or  tingling, no vision change or hearing loss. Has dizziness Skin: no rash, no skin tear. MSK: No muscle spasm, no deformity, no limitation of range of movement in spin Heme: No easy bruising.  Travel history: No recent long distant travel.  Allergy: No Known Allergies  Past Medical History:  Diagnosis Date  . Alcohol use   . Tobacco abuse     History reviewed. No pertinent surgical history.  Social History:  reports that he has been smoking. He has never used smokeless tobacco. He reports current alcohol use. He reports that he does not use drugs.  Family History:  Family History  Problem Relation Age of Onset  . Cancer Mother        his mother died of cancer, but patient is not sure which type of cancner.   . Diabetes Mellitus II Niece      Prior to Admission medications   Not on File    Physical Exam: Vitals:   02/28/20 1634 02/28/20 1635 02/28/20 1715 02/28/20 1750  BP:   (!) 130/66 (!) 130/66  Pulse:   82 82  Resp:   14   Temp: 98.6 F (37 C)     TempSrc: Oral     SpO2:   100%   Weight:  67.1 kg    Height:  5\' 6"  (1.676 m)     General: Not in acute distress. Pale looking. HEENT:       Eyes: PERRL, EOMI, no scleral icterus.       ENT: No discharge from the ears and nose, no pharynx injection, no tonsillar enlargement.  Neck: No JVD, no bruit, no mass felt. Heme: No neck lymph node enlargement. Cardiac: S1/S2, RRR, No murmurs, No gallops or rubs. Respiratory:  No rales, wheezing, rhonchi or rubs. GI: Soft, nondistended, nontender, no rebound pain, no organomegaly, BS present. GU: No hematuria Ext: No pitting leg edema bilaterally. 2+DP/PT pulse bilaterally. Musculoskeletal: No joint deformities, No joint redness or warmth, no limitation of ROM in spin. Skin: No rashes.  Neuro: Alert, oriented X3, cranial nerves II-XII grossly intact, moves all extremities normally.  Psych: Patient is not psychotic, no suicidal or hemocidal ideation.  Labs on Admission:  I have personally reviewed following labs and imaging studies  CBC: Recent Labs  Lab 02/28/20 1008 02/28/20 1114 02/28/20 1155  WBC 5.5 5.0 4.1  HGB 3.5* 3.4* 3.4*  HCT 13.4* 13.1* 13.0*  MCV 66.3* 65.8* 64.7*  PLT 279 254 219   Basic Metabolic Panel: Recent Labs  Lab 02/28/20 1008 02/28/20 1155  NA 139  --   K 3.8  --   CL 107  --   CO2 21*  --   GLUCOSE 103*  --   BUN 12  --   CREATININE 1.03  --   CALCIUM 9.1  --   MG  --  1.9   GFR: Estimated Creatinine Clearance: 66.2 mL/min (by C-G formula based on SCr of 1.03 mg/dL). Liver Function Tests: No results for input(s): AST, ALT, ALKPHOS, BILITOT, PROT, ALBUMIN in the last 168 hours. No results for input(s): LIPASE, AMYLASE in the last 168 hours. No results for input(s): AMMONIA in the last 168 hours. Coagulation Profile: Recent Labs  Lab 02/28/20 1155  INR 1.1   Cardiac Enzymes: No results for input(s): CKTOTAL, CKMB, CKMBINDEX, TROPONINI in the last 168 hours. BNP (last 3 results) No results for input(s): PROBNP in the last 8760 hours. HbA1C: No results for input(s): HGBA1C in the last 72 hours. CBG: No results for input(s): GLUCAP in the last 168 hours. Lipid Profile: No results for input(s): CHOL, HDL, LDLCALC, TRIG, CHOLHDL, LDLDIRECT in the last 72 hours. Thyroid Function Tests: No results for input(s): TSH, T4TOTAL, FREET4, T3FREE, THYROIDAB in the last 72 hours. Anemia Panel: No results for input(s): VITAMINB12, FOLATE, FERRITIN, TIBC, IRON, RETICCTPCT in the last 72 hours. Urine analysis: No results found for: COLORURINE, APPEARANCEUR, LABSPEC, PHURINE, GLUCOSEU, HGBUR, BILIRUBINUR, KETONESUR, PROTEINUR, UROBILINOGEN, NITRITE, LEUKOCYTESUR Sepsis Labs: @LABRCNTIP (procalcitonin:4,lacticidven:4) ) Recent Results (from the past 240 hour(s))  SARS Coronavirus 2 by RT PCR (hospital order, performed in [facility] hospital lab) Nasopharyngeal Nasopharyngeal Swab     Status: None   Collection Time:  02/28/20 11:41 AM   Specimen: Nasopharyngeal Swab  Result Value Ref Range Status   SARS Coronavirus 2 NEGATIVE NEGATIVE Final    Comment: (NOTE) SARS-CoV-2 target nucleic acids are NOT DETECTED.  The SARS-CoV-2 RNA is generally detectable in upper and lower respiratory specimens during the acute phase of infection. The lowest concentration of SARS-CoV-2 viral copies this assay can detect is 250 copies / mL. A negative result does not preclude SARS-CoV-2 infection and should not be used as the sole basis for treatment or other patient management decisions.  A negative result may occur with improper specimen collection / handling, submission of specimen other than nasopharyngeal swab, presence of viral mutation(s) within the areas targeted by this assay, and inadequate number of viral copies (<250 copies / mL). A negative result must be combined with clinical observations, patient history, and epidemiological information.  Fact Sheet for Patients:   03/01/20  Fact Sheet for Healthcare Providers: https://pope.com/  This test is not yet approved or  cleared by the [place] FDA and has been authorized for detection and/or diagnosis of SARS-CoV-2 by FDA under an Emergency Use Authorization (EUA).  This EUA will remain in effect (meaning this test can be used) for the duration of the COVID-19 declaration under Section 564(b)(1) of the Act, 21 U.S.C. section 360bbb-3(b)(1), unless the authorization is terminated or revoked sooner.  Performed at [facility], [address] [place], [place] [address]      Radiological Exams on Admission: DG Chest 2 View  Result Date: 02/28/2020 CLINICAL DATA:  Dizziness, nausea, and SOB x a few weeks. Current smoker- 3-4 cigarettes per day. EXAM: CHEST - 2 VIEW COMPARISON:  None. FINDINGS: The heart size and mediastinal contours are within normal limits. Both lungs are clear. No  pleural effusion or pneumothorax. The visualized skeletal structures are unremarkable. IMPRESSION: No active cardiopulmonary disease. Electronically Signed   By: Amie Portland M.D.   On: 02/28/2020 10:41     EKG: Independently reviewed.  Sinus rhythm, QTC 450, low voltage, LAE, nonspecific T wave change   Assessment/Plan Principal Problem:   Symptomatic anemia Active Problems:   Tobacco abuse   Microcytic anemia   Alcohol use   Symptomatic anemia and normocytic anemia: Hemoglobin 3.4, no baseline hemoglobin available.  FOBT negative.  Possible GI bleeding.  Patient never had EGD or colonoscopy. Dr. Norma Fredrickson of GI is consulted  - Placed on MedSurg bed for observation - LDH, peripheral smear - Anemia panel -Transfuse 4 unit of blood - GI consulted by Ed, will follow up recommendations - IVF: 1L NS bolus - Start IV pantoprazole 40 mg bid - Zofran IV for nausea - Avoid NSAIDs and SQ heparin - Maintain IV access (2 large bore IVs if possible). - Monitor closely and follow q6h cbc, transfuse as necessary, if Hgb<7.0 - LaB: INR, PTT and type screen   Addendum: Peripheral smear showed schistocytes.  Oncologist, Dr. Donneta Romberg is consulted.  He reviewed the lab -->does not have hemolysis, not suggestive of MAHA-TTP.  Appears to be chronic iron deficiency.   Tobacco abuse and Alcohol use: -Did counseling about importance of quitting smoking -Nicotine patch -Did counseling about the importance of quitting drinking -CIWA protocol    DVT ppx: SCD Code Status: Full code Family Communication:  Yes, patient's partner at bed side Disposition Plan:  Anticipate discharge back to previous environment Consults called:  Dr. Norma Fredrickson of GI and Dr. Donneta Romberg of oncology Admission status:  progressive unit for obs    Status is: Observation  The patient remains OBS appropriate and will d/c before 2 midnights.  Dispo: The patient is from: Home              Anticipated d/c is to: Home               Anticipated d/c date is: 1 day              Patient currently is not medically stable to d/c.          Date of Service 02/28/2020    Lorretta Harp [facility]   If 7PM-7AM, please contact night-coverage [email or web address] 02/28/2020, 5:51 PM

## 2020-02-29 ENCOUNTER — Encounter: Payer: Self-pay | Admitting: Internal Medicine

## 2020-02-29 DIAGNOSIS — D649 Anemia, unspecified: Secondary | ICD-10-CM

## 2020-02-29 DIAGNOSIS — E611 Iron deficiency: Secondary | ICD-10-CM

## 2020-02-29 LAB — CBC
HCT: 19.8 % — ABNORMAL LOW (ref 39.0–52.0)
HCT: 23.9 % — ABNORMAL LOW (ref 39.0–52.0)
Hemoglobin: 5.9 g/dL — ABNORMAL LOW (ref 13.0–17.0)
Hemoglobin: 7.7 g/dL — ABNORMAL LOW (ref 13.0–17.0)
MCH: 21.7 pg — ABNORMAL LOW (ref 26.0–34.0)
MCH: 23.1 pg — ABNORMAL LOW (ref 26.0–34.0)
MCHC: 29.8 g/dL — ABNORMAL LOW (ref 30.0–36.0)
MCHC: 32.2 g/dL (ref 30.0–36.0)
MCV: 72.8 fL — ABNORMAL LOW (ref 80.0–100.0)
MCV: 71.8 fL — ABNORMAL LOW (ref 80.0–100.0)
Platelets: 211 10*3/uL (ref 150–400)
Platelets: 197 10*3/uL (ref 150–400)
RBC: 2.72 MIL/uL — ABNORMAL LOW (ref 4.22–5.81)
RBC: 3.33 MIL/uL — ABNORMAL LOW (ref 4.22–5.81)
RDW: 22.9 % — ABNORMAL HIGH (ref 11.5–15.5)
RDW: 22.5 % — ABNORMAL HIGH (ref 11.5–15.5)
WBC: 5.3 10*3/uL (ref 4.0–10.5)
WBC: 6.7 10*3/uL (ref 4.0–10.5)
nRBC: 0 % (ref 0.0–0.2)
nRBC: 0 % (ref 0.0–0.2)

## 2020-02-29 LAB — FOLATE: Folate: 12 ng/mL (ref >5.9)

## 2020-02-29 LAB — RETICULOCYTES
Immature Retic Fract: 21.3 % — ABNORMAL HIGH (ref 2.3–15.9)
RBC.: 2.78 MIL/uL — ABNORMAL LOW (ref 4.22–5.81)
Retic Count, Absolute: 53.1 10*3/uL (ref 19.0–186.0)
Retic Ct Pct: 1.9 % (ref 0.4–3.1)

## 2020-02-29 LAB — BASIC METABOLIC PANEL
Anion gap: 8 (ref 5–15)
BUN: 8 mg/dL (ref 8–23)
CO2: 21 mmol/L — ABNORMAL LOW (ref 22–32)
Calcium: 8.8 mg/dL — ABNORMAL LOW (ref 8.9–10.3)
Chloride: 110 mmol/L (ref 98–111)
Creatinine, Ser: 0.86 mg/dL (ref 0.61–1.24)
GFR calc Af Amer: >60 mL/min (ref >60)
GFR calc non Af Amer: >60 mL/min (ref >60)
Glucose, Bld: 98 mg/dL (ref 70–99)
Potassium: 3.9 mmol/L (ref 3.5–5.1)
Sodium: 139 mmol/L (ref 135–145)

## 2020-02-29 LAB — HIV ANTIBODY (ROUTINE TESTING W REFLEX): HIV Screen 4th Generation wRfx: NONREACTIVE

## 2020-02-29 LAB — FERRITIN: Ferritin: 3 ng/mL — ABNORMAL LOW (ref 24–336)

## 2020-02-29 LAB — HEMOGLOBIN
Hemoglobin: 9.7 g/dL — ABNORMAL LOW (ref 13.0–17.0)
Hemoglobin: 9.5 g/dL — ABNORMAL LOW (ref 13.0–17.0)
Hemoglobin: 10.1 g/dL — ABNORMAL LOW (ref 13.0–17.0)

## 2020-02-29 LAB — IRON AND TIBC
Iron: 43 ug/dL — ABNORMAL LOW (ref 45–182)
Saturation Ratios: 9 % — ABNORMAL LOW (ref 17.9–39.5)
TIBC: 476 ug/dL — ABNORMAL HIGH (ref 250–450)
UIBC: 433 ug/dL

## 2020-02-29 LAB — VITAMIN B12: Vitamin B-12: 435 pg/mL (ref 180–914)

## 2020-02-29 LAB — GLUCOSE, CAPILLARY: Glucose-Capillary: 99 mg/dL (ref 70–99)

## 2020-02-29 MED ORDER — BISACODYL 5 MG PO TBEC
10.0000 mg | DELAYED_RELEASE_TABLET | Freq: Once | ORAL | Status: AC
  Administered 2020-02-29: 10 mg via ORAL
  Filled 2020-02-29: qty 2

## 2020-02-29 MED ORDER — POLYETHYLENE GLYCOL 3350 17 GM/SCOOP PO POWD
1.0000 | Freq: Once | ORAL | Status: AC
  Administered 2020-02-29: 255 g via ORAL
  Filled 2020-02-29: qty 255

## 2020-02-29 NOTE — Plan of Care (Signed)
Hgb improving with administration of blood products.

## 2020-02-29 NOTE — Consult Note (Signed)
GI Inpatient Consult Note  Reason for Consult: Symptomatic anemia    Attending Requesting Consult: Dr. Lorretta Harp, MD  History of Present Illness: Alan Everett is a 64 y.o. male seen for evaluation of symptomatic anemia at the request of admitting physician Dr. Lorretta Harp. Pt has a PMH of tobacco and alcohol abuse. He presented from home for chief complaint of progressive shortness of breath, dizziness, and lightheadedness for one week. Upon presentation to the ED, he was found to have profound anemia with hemoglobin 3.5, hematocrit 13.0, MCV 64.7, heme positive stool, and otherwise unremarkable labs and vital signs. No previous hemoglobin to establish a baseline. Per ED physician Dr. Fanny Bien, he has brown stool but is trace heme positive with positive control. He has never had EGD or colonoscopy. Patient denies any over gastrointestinal blood loss in the form of hematochezia, melena, or hematemesis. He reports no recent changes in his bowel habits. He normally has a formed BM every 1-2 days without any significant straining or sensations of incomplete evacuation. He does not take any medications daily. He denies any UGI symptoms such as nausea, vomiting, dysphagia, odynophagia, early satiety, or epigastric abdominal pain. No history of GERD symptoms. He does drink appx 6 beers daily. He reports appetite and diet have been stable with no significant unintentional weight loss. He has not had a BM overnight or this morning. He does take a BC Powder appx 1 every other day for headaches and minor aches and pains. No history of PUD or UGI bleeding. He received a total of 4 units of pRBCs and hemoglobin 7.7 this morning. He is feeling much better since transfusion. No specific complaints at this time. He reports a family history of colon polyps in his mother, but no known family history of colorectal or other GI malignancies. Patient works part-time at [employer] in [place], [place].     Past Medical History:   Past Medical History:  Diagnosis Date  . Alcohol use   . Tobacco abuse     Problem List: Patient Active Problem List   Diagnosis Date Noted  . Symptomatic anemia 02/28/2020  . Microcytic anemia 02/28/2020  . Alcohol use 02/28/2020  . Tobacco abuse     Past Surgical History: History reviewed. No pertinent surgical history.  Allergies: No Known Allergies  Home Medications: Medications Prior to Admission  Medication Sig Dispense Refill Last Dose  . acetaminophen (TYLENOL) 500 MG tablet Take 500-1,000 mg by mouth every 6 (six) hours as needed for mild pain, moderate pain or fever.   Unknown at PRN  . ibuprofen (ADVIL) 200 MG tablet Take 200-400 mg by mouth every 6 (six) hours as needed for fever or mild pain.   Unknown at PRN   Home medication reconciliation was completed with the patient.   Scheduled Inpatient Medications:   . folic acid  1 mg Oral Daily  . LORazepam  0-4 mg Intravenous Q6H   Followed by  . [START ON 03/01/2020] LORazepam  0-4 mg Intravenous Q12H  . multivitamin with minerals  1 tablet Oral Daily  . nicotine  21 mg Transdermal Daily  . pantoprazole (PROTONIX) IV  40 mg Intravenous Q12H  . sodium chloride flush  3 mL Intravenous Once  . thiamine  100 mg Oral Daily   Or  . thiamine  100 mg Intravenous Daily    Continuous Inpatient Infusions:   . sodium chloride Stopped (02/28/20 1526)    PRN Inpatient Medications:  acetaminophen **OR** acetaminophen, LORazepam **OR** LORazepam,  ondansetron **OR** ondansetron (ZOFRAN) IV  Family History: family history includes Cancer in his mother; Diabetes Mellitus II in his niece.  The patient's family history is negative for inflammatory bowel disorders, GI malignancy, or solid organ transplantation.  Social History:   reports that he has been smoking. He has never used smokeless tobacco. He reports current alcohol use. He reports that he does not use drugs. The patient denies ETOH, tobacco, or drug use.   Review  of Systems: Constitutional: Weight is stable.  Eyes: No changes in vision. ENT: No oral lesions, sore throat.  GI: see HPI.  Heme/Lymph: No easy bruising.  CV: No chest pain.  GU: No hematuria.  Integumentary: No rashes.  Neuro: No headaches.  Psych: No depression/anxiety.  Endocrine: No heat/cold intolerance.  Allergic/Immunologic: No urticaria.  Resp: No cough, SOB.  Musculoskeletal: No joint swelling.    Physical Examination: BP (!) 146/59 (BP Location: Left Arm)   Pulse 77   Temp (!) 97.5 F (36.4 C) (Oral)   Resp 16   Ht 5\' 6"  (1.676 m)   Wt 67.1 kg   SpO2 100%   BMI 23.89 kg/m  Gen: NAD, alert and oriented x 4 HEENT: PEERLA, EOMI, Neck: supple, no JVD or thyromegaly Chest: CTA bilaterally, no wheezes, crackles, or other adventitious sounds CV: RRR, no m/g/c/r Abd: soft, NT, ND, +BS in all four quadrants; no HSM, guarding, ridigity, or rebound tenderness Ext: no edema, well perfused with 2+ pulses, Skin: no rash or lesions noted Lymph: no LAD  Data: Lab Results  Component Value Date   WBC 6.7 02/29/2020   HGB 7.7 (L) 02/29/2020   HCT 23.9 (L) 02/29/2020   MCV 71.8 (L) 02/29/2020   PLT 197 02/29/2020   Recent Labs  Lab 02/28/20 1831 02/29/20 0105 02/29/20 0455  HGB 4.9* 5.9* 7.7*   Lab Results  Component Value Date   NA 139 02/29/2020   K 3.9 02/29/2020   CL 110 02/29/2020   CO2 21 (L) 02/29/2020   BUN 8 02/29/2020   CREATININE 0.86 02/29/2020   No results found for: ALT, AST, GGT, ALKPHOS, BILITOT Recent Labs  Lab 02/28/20 1155  APTT 31  INR 1.1   Assessment/Plan:  64 y/o AA male with a PMH of tobacco and alcohol abuse admitted for symptomatic anemia  1. Symptomatic anemia 2. Iron-deficiency anemia - ferritin 3, iron sat 9%, TIBC 476, iron 43  - Profound anemia with hemoglobin 3.5 on admission with microcytosis 64.7 consistent with iron-deficiency anemia. No baseline hemoglobin. - DDX includes occult GI bleeding versus occult  malignancy, including PUD, gastritis, esophagitis, duodenitis, AVM, malignancy, polyp, etc - Agree with blood transfusions as necessary with goal Hgb >7.0 - Continue to monitor H&H q6h and transfuse as necessary - Continue IV acid suppression  - Avoid NSAIDs - He is naive to endoscopic evaluation. Given profound anemia and evidence of severe iron-deficiency with heme positive stool, I advise EGD and colonoscopy for further evaluation. We discussed procedure details and indications in hospital room today. He consents to proceed. - Plan for EGD and colonoscopy tomorrow with Dr. 64 - Further recommendations after procedures - Clear liquid diet today. NPO after midnight. - Patient will complete Miralax/Gatorade bowel prep starting 1600 today  I reviewed the risks (including bleeding, perforation, infection, anesthesia complications, cardiac/respiratory complications), benefits and alternatives of EGD and colonoscopy. Patient consents to proceed.    Thank you for the consult. Please call with questions or concerns.  Norma Fredrickson, PA-C [facility]  Gastroenterology [phone] (Cell)

## 2020-02-29 NOTE — H&P (View-Only) (Signed)
GI Inpatient Consult Note  Reason for Consult: Symptomatic anemia    Attending Requesting Consult: Dr. Lorretta Harp, MD  History of Present Illness: Alan Everett is a 64 y.o. male seen for evaluation of symptomatic anemia at the request of admitting physician Dr. Lorretta Harp. Pt has a PMH of tobacco and alcohol abuse. He presented from home for chief complaint of progressive shortness of breath, dizziness, and lightheadedness for one week. Upon presentation to the ED, he was found to have profound anemia with hemoglobin 3.5, hematocrit 13.0, MCV 64.7, heme positive stool, and otherwise unremarkable labs and vital signs. No previous hemoglobin to establish a baseline. Per ED physician Dr. Fanny Bien, he has brown stool but is trace heme positive with positive control. He has never had EGD or colonoscopy. Patient denies any over gastrointestinal blood loss in the form of hematochezia, melena, or hematemesis. He reports no recent changes in his bowel habits. He normally has a formed BM every 1-2 days without any significant straining or sensations of incomplete evacuation. He does not take any medications daily. He denies any UGI symptoms such as nausea, vomiting, dysphagia, odynophagia, early satiety, or epigastric abdominal pain. No history of GERD symptoms. He does drink appx 6 beers daily. He reports appetite and diet have been stable with no significant unintentional weight loss. He has not had a BM overnight or this morning. He does take a BC Powder appx 1 every other day for headaches and minor aches and pains. No history of PUD or UGI bleeding. He received a total of 4 units of pRBCs and hemoglobin 7.7 this morning. He is feeling much better since transfusion. No specific complaints at this time. He reports a family history of colon polyps in his mother, but no known family history of colorectal or other GI malignancies. Patient works part-time at [employer] in [place], [place].     Past Medical History:   Past Medical History:  Diagnosis Date  . Alcohol use   . Tobacco abuse     Problem List: Patient Active Problem List   Diagnosis Date Noted  . Symptomatic anemia 02/28/2020  . Microcytic anemia 02/28/2020  . Alcohol use 02/28/2020  . Tobacco abuse     Past Surgical History: History reviewed. No pertinent surgical history.  Allergies: No Known Allergies  Home Medications: Medications Prior to Admission  Medication Sig Dispense Refill Last Dose  . acetaminophen (TYLENOL) 500 MG tablet Take 500-1,000 mg by mouth every 6 (six) hours as needed for mild pain, moderate pain or fever.   Unknown at PRN  . ibuprofen (ADVIL) 200 MG tablet Take 200-400 mg by mouth every 6 (six) hours as needed for fever or mild pain.   Unknown at PRN   Home medication reconciliation was completed with the patient.   Scheduled Inpatient Medications:   . folic acid  1 mg Oral Daily  . LORazepam  0-4 mg Intravenous Q6H   Followed by  . [START ON 03/01/2020] LORazepam  0-4 mg Intravenous Q12H  . multivitamin with minerals  1 tablet Oral Daily  . nicotine  21 mg Transdermal Daily  . pantoprazole (PROTONIX) IV  40 mg Intravenous Q12H  . sodium chloride flush  3 mL Intravenous Once  . thiamine  100 mg Oral Daily   Or  . thiamine  100 mg Intravenous Daily    Continuous Inpatient Infusions:   . sodium chloride Stopped (02/28/20 1526)    PRN Inpatient Medications:  acetaminophen **OR** acetaminophen, LORazepam **OR** LORazepam,  ondansetron **OR** ondansetron (ZOFRAN) IV  Family History: family history includes Cancer in his mother; Diabetes Mellitus II in his niece.  The patient's family history is negative for inflammatory bowel disorders, GI malignancy, or solid organ transplantation.  Social History:   reports that he has been smoking. He has never used smokeless tobacco. He reports current alcohol use. He reports that he does not use drugs. The patient denies ETOH, tobacco, or drug use.   Review  of Systems: Constitutional: Weight is stable.  Eyes: No changes in vision. ENT: No oral lesions, sore throat.  GI: see HPI.  Heme/Lymph: No easy bruising.  CV: No chest pain.  GU: No hematuria.  Integumentary: No rashes.  Neuro: No headaches.  Psych: No depression/anxiety.  Endocrine: No heat/cold intolerance.  Allergic/Immunologic: No urticaria.  Resp: No cough, SOB.  Musculoskeletal: No joint swelling.    Physical Examination: BP (!) 146/59 (BP Location: Left Arm)   Pulse 77   Temp (!) 97.5 F (36.4 C) (Oral)   Resp 16   Ht 5' 6" (1.676 m)   Wt 67.1 kg   SpO2 100%   BMI 23.89 kg/m  Gen: NAD, alert and oriented x 4 HEENT: PEERLA, EOMI, Neck: supple, no JVD or thyromegaly Chest: CTA bilaterally, no wheezes, crackles, or other adventitious sounds CV: RRR, no m/g/c/r Abd: soft, NT, ND, +BS in all four quadrants; no HSM, guarding, ridigity, or rebound tenderness Ext: no edema, well perfused with 2+ pulses, Skin: no rash or lesions noted Lymph: no LAD  Data: Lab Results  Component Value Date   WBC 6.7 02/29/2020   HGB 7.7 (L) 02/29/2020   HCT 23.9 (L) 02/29/2020   MCV 71.8 (L) 02/29/2020   PLT 197 02/29/2020   Recent Labs  Lab 02/28/20 1831 02/29/20 0105 02/29/20 0455  HGB 4.9* 5.9* 7.7*   Lab Results  Component Value Date   NA 139 02/29/2020   K 3.9 02/29/2020   CL 110 02/29/2020   CO2 21 (L) 02/29/2020   BUN 8 02/29/2020   CREATININE 0.86 02/29/2020   No results found for: ALT, AST, GGT, ALKPHOS, BILITOT Recent Labs  Lab 02/28/20 1155  APTT 31  INR 1.1   Assessment/Plan:  63 y/o AA male with a PMH of tobacco and alcohol abuse admitted for symptomatic anemia  1. Symptomatic anemia 2. Iron-deficiency anemia - ferritin 3, iron sat 9%, TIBC 476, iron 43  - Profound anemia with hemoglobin 3.5 on admission with microcytosis 64.7 consistent with iron-deficiency anemia. No baseline hemoglobin. - DDX includes occult GI bleeding versus occult  malignancy, including PUD, gastritis, esophagitis, duodenitis, AVM, malignancy, polyp, etc - Agree with blood transfusions as necessary with goal Hgb >7.0 - Continue to monitor H&H q6h and transfuse as necessary - Continue IV acid suppression  - Avoid NSAIDs - He is naive to endoscopic evaluation. Given profound anemia and evidence of severe iron-deficiency with heme positive stool, I advise EGD and colonoscopy for further evaluation. We discussed procedure details and indications in hospital room today. He consents to proceed. - Plan for EGD and colonoscopy tomorrow with Dr. [place] - Further recommendations after procedures - Clear liquid diet today. NPO after midnight. - Patient will complete Miralax/Gatorade bowel prep starting 1600 today  I reviewed the risks (including bleeding, perforation, infection, anesthesia complications, cardiac/respiratory complications), benefits and alternatives of EGD and colonoscopy. Patient consents to proceed.    Thank you for the consult. Please call with questions or concerns.  Jes Costales M Holston Oyama, PA-C [facility]   Gastroenterology [phone] (Cell)

## 2020-02-29 NOTE — Progress Notes (Signed)
PROGRESS NOTE    Alan Everett  YYT:[id number] DOB: 02/23/1956 DOA: 02/28/2020 PCP: Patient, No Pcp Per   Brief Narrative:  HPI: Alan Everett is a 64 y.o. male without significant past medical history except for tobacco abuse and alcohol use, who presents with shortness of breath and lightheadedness.  Patient states that he has been having shortness breath, dizziness, lightheadedness for about 1 week, which has been progressively worsening.  Patient does not have cough, chest pain, fever or chills.  He has nausea, but no vomiting, diarrhea or abdominal pain.  No symptoms of UTI or unilateral weakness.  Denies dark stool or bloody stool. Patient states that he is generally healthy, not taking prescribed medications.  He occasionally take Tylenol and over-the-counter pain medications. He never had an EGD or colonoscopy before. His partner reported that the patient drinks beers almost every day, some times he drinks 6 beers at one time.   8/1: Patient seen and examined.  Status post 4 units packed red blood cells.  Hemoglobin responded from initial value of 3.4-7.7 today.  Seen by gastroenterology.  Plan for upper and lower endoscopy tomorrow.   Assessment & Plan:   Principal Problem:   Symptomatic anemia Active Problems:   Tobacco abuse   Microcytic anemia   Alcohol use  Symptomatic anemia Normocytic anemia with schistocytes Possible GI bleed Patient presented with weakness and fatigue worsening over the past several weeks.  Found to have hemoglobin of 3.4 on admission.  Given 4 units packed red blood cells with appropriate response of hemoglobin.  Gastroenterology consulted from admission.  Hematology also consulted from admission.  Peripheral smear demonstrating schistocytes.  Given no hemolysis not suggestive of microangiopathic hemolytic anemia or TTP.  Appears to be chronic iron deficiency. Plan: Admit inpatient Trend hemoglobin every 6 hours Transfuse PRBC as needed hemoglobin less  than 7 Clear liquid diet today N.p.o. after midnight Upper and lower scope planned for tomorrow with Dr. Norma Fredrickson All bowel prep orders placed by GI consult  Tobacco abuse Counseled patient Offer nicotine patch patient refused  Alcohol abuse No clinical indicators withdrawal at this time Maintain CIWA protocol   DVT prophylaxis: SCD Code Status: Full Family Communication: None today Disposition Plan: Status is: Inpatient  Remains inpatient appropriate because:Inpatient level of care appropriate due to severity of illness   Dispo: The patient is from: Home              Anticipated d/c is to: Home              Anticipated d/c date is: 2 days              Patient currently is not medically stable to d/c.  Stable at symptomatic anemia of unclear etiology.  Plan for upper and lower endoscopy tomorrow 221       Consultants:   GI  Procedures:  None  Antimicrobials:   None   Subjective: Seen and examined.  Complains of fatigue.  No pain complaints  Objective: Vitals:   02/29/20 0522 02/29/20 0805 02/29/20 0818 02/29/20 1155  BP: (!) 153/72 (!) 154/75 (!) 146/59 (!) 137/64  Pulse: 79 77 77 73  Resp: 18 16 16 16   Temp: 98.7 F (37.1 C) 99.1 F (37.3 C) (!) 97.5 F (36.4 C) 98 F (36.7 C)  TempSrc: Oral Oral Oral Oral  SpO2: 100% 100% 100% 100%  Weight:      Height:        Intake/Output Summary (Last 24 hours) at 02/29/2020  1346 Last data filed at 02/29/2020 0824 Gross per 24 hour  Intake 2245 ml  Output 1275 ml  Net 970 ml   Filed Weights   02/28/20 1635  Weight: 67.1 kg    Examination:  General: No apparent distress, patient appears well HEENT: Normocephalic, atraumatic, dry mucous membranes, pale conjunctiva Neck, supple, trachea midline, no tenderness Heart: Regular rate and rhythm, S1/S2 normal, no murmurs Lungs: Clear to auscultation bilaterally, no adventitious sounds, normal work of breathing Abdomen: Soft, nontender, nondistended,  positive bowel sounds Extremities: Normal, atraumatic, no clubbing or cyanosis, normal muscle tone Skin: No rashes or lesions, normal color Neurologic: Cranial nerves grossly intact, sensation intact, alert and oriented x3 Psychiatric: Normal affect      Data Reviewed: I have personally reviewed following labs and imaging studies  CBC: Recent Labs  Lab 02/28/20 1114 02/28/20 1114 02/28/20 1155 02/28/20 1831 02/29/20 0105 02/29/20 0455 02/29/20 0940  WBC 5.0  --  4.1 5.2 5.3 6.7  --   HGB 3.4*   < > 3.4* 4.9* 5.9* 7.7* 9.7*  HCT 13.1*  --  13.0* 17.4* 19.8* 23.9*  --   MCV 65.8*  --  64.7* 70.2* 72.8* 71.8*  --   PLT 254  --  219 256 211 197  --    < > = values in this interval not displayed.   Basic Metabolic Panel: Recent Labs  Lab 02/28/20 1008 02/28/20 1155 02/29/20 0455  NA 139  --  139  K 3.8  --  3.9  CL 107  --  110  CO2 21*  --  21*  GLUCOSE 103*  --  98  BUN 12  --  8  CREATININE 1.03  --  0.86  CALCIUM 9.1  --  8.8*  MG  --  1.9  --    GFR: Estimated Creatinine Clearance: 79.3 mL/min (by C-G formula based on SCr of 0.86 mg/dL). Liver Function Tests: No results for input(s): AST, ALT, ALKPHOS, BILITOT, PROT, ALBUMIN in the last 168 hours. No results for input(s): LIPASE, AMYLASE in the last 168 hours. No results for input(s): AMMONIA in the last 168 hours. Coagulation Profile: Recent Labs  Lab 02/28/20 1155  INR 1.1   Cardiac Enzymes: No results for input(s): CKTOTAL, CKMB, CKMBINDEX, TROPONINI in the last 168 hours. BNP (last 3 results) No results for input(s): PROBNP in the last 8760 hours. HbA1C: No results for input(s): HGBA1C in the last 72 hours. CBG: Recent Labs  Lab 02/29/20 0820  GLUCAP 99   Lipid Profile: No results for input(s): CHOL, HDL, LDLCALC, TRIG, CHOLHDL, LDLDIRECT in the last 72 hours. Thyroid Function Tests: No results for input(s): TSH, T4TOTAL, FREET4, T3FREE, THYROIDAB in the last 72 hours. Anemia Panel: Recent  Labs    02/29/20 0105 02/29/20 0111  VITAMINB12  --  435  FOLATE 12.0  --   FERRITIN 3*  --   TIBC 476*  --   IRON 43*  --   RETICCTPCT 1.9  --    Sepsis Labs: No results for input(s): PROCALCITON, LATICACIDVEN in the last 168 hours.  Recent Results (from the past 240 hour(s))  SARS Coronavirus 2 by RT PCR (hospital order, performed in [facility] hospital lab) Nasopharyngeal Nasopharyngeal Swab     Status: None   Collection Time: 02/28/20 11:41 AM   Specimen: Nasopharyngeal Swab  Result Value Ref Range Status   SARS Coronavirus 2 NEGATIVE NEGATIVE Final    Comment: (NOTE) SARS-CoV-2 target nucleic acids are NOT DETECTED.  The SARS-CoV-2 RNA is generally detectable in upper and lower respiratory specimens during the acute phase of infection. The lowest concentration of SARS-CoV-2 viral copies this assay can detect is 250 copies / mL. A negative result does not preclude SARS-CoV-2 infection and should not be used as the sole basis for treatment or other patient management decisions.  A negative result may occur with improper specimen collection / handling, submission of specimen other than nasopharyngeal swab, presence of viral mutation(s) within the areas targeted by this assay, and inadequate number of viral copies (<250 copies / mL). A negative result must be combined with clinical observations, patient history, and epidemiological information.  Fact Sheet for Patients:   BoilerBrush.com.cy  Fact Sheet for Healthcare Providers: https://pope.com/  This test is not yet approved or  cleared by the [place] FDA and has been authorized for detection and/or diagnosis of SARS-CoV-2 by FDA under an Emergency Use Authorization (EUA).  This EUA will remain in effect (meaning this test can be used) for the duration of the COVID-19 declaration under Section 564(b)(1) of the Act, 21 U.S.C. section 360bbb-3(b)(1), unless the  authorization is terminated or revoked sooner.  Performed at [facility], [address], [place], [place] [address]          Radiology Studies: DG Chest 2 View  Result Date: 02/28/2020 CLINICAL DATA:  Dizziness, nausea, and SOB x a few weeks. Current smoker- 3-4 cigarettes per day. EXAM: CHEST - 2 VIEW COMPARISON:  None. FINDINGS: The heart size and mediastinal contours are within normal limits. Both lungs are clear. No pleural effusion or pneumothorax. The visualized skeletal structures are unremarkable. IMPRESSION: No active cardiopulmonary disease. Electronically Signed   By: Amie Portland M.D.   On: 02/28/2020 10:41        Scheduled Meds: . folic acid  1 mg Oral Daily  . LORazepam  0-4 mg Intravenous Q6H   Followed by  . [START ON 03/01/2020] LORazepam  0-4 mg Intravenous Q12H  . multivitamin with minerals  1 tablet Oral Daily  . pantoprazole (PROTONIX) IV  40 mg Intravenous Q12H  . polyethylene glycol powder  1 Container Oral Once  . sodium chloride flush  3 mL Intravenous Once  . thiamine  100 mg Oral Daily   Or  . thiamine  100 mg Intravenous Daily   Continuous Infusions:   LOS: 0 days    Time spent: 25 minutes    Tresa Moore, MD [facility] Pager [phone]  If 7PM-7AM, please contact night-coverage 02/29/2020, 1:46 PM

## 2020-02-29 NOTE — Assessment & Plan Note (Addendum)
#  64 year old male patient admitted to the hospital for symptomatic shortness of breath on exertion fatigue noted to have severe anemia hemoglobin 3.8  #Severe anemia hemoglobin 3.8 on presentation-severe microcytosis; post PRBC transfusion hemoglobin is around 9.  Iron sat suggestive of severe iron deficiency-etiology unclear.  EGD colonoscopy-this afternoon.  #Addendum-patient's EGD colonoscopy negative for any acute process to explain patient's severe anemia.  Would recommend follow-up in the cancer center in the next 1 week for IV iron infusions.  Patient also need CT scan of the abdomen pelvis to further work-up iron deficient anemia/we will plan outpatient.

## 2020-02-29 NOTE — Consult Note (Signed)
[facility] CONSULT NOTE  Patient Care Team: Patient, No Pcp Per as PCP - General (General Practice)  CHIEF COMPLAINTS/PURPOSE OF CONSULTATION: Severe anemia  HISTORY OF PRESENTING ILLNESS:  Alan Everett 64 y.o.  male without any prior history of medical problems-is currently admitted hospital for severe shortness of breath on exertion/fatigue.  Patient noted to have a hemoglobin of 3.8 in emergency room.  Patient denies any blood in stools and black or stools.  No blood in urine.  No nausea or vomiting.  No difficulty swallowing.  Intermittent heartburn.  No diarrhea constipation.  Review of Systems  Constitutional: Positive for malaise/fatigue. Negative for chills, diaphoresis, fever and weight loss.  HENT: Negative for nosebleeds and sore throat.   Eyes: Negative for double vision.  Respiratory: Positive for shortness of breath. Negative for cough, hemoptysis, sputum production and wheezing.   Cardiovascular: Negative for chest pain, palpitations, orthopnea and leg swelling.  Gastrointestinal: Positive for heartburn. Negative for abdominal pain, blood in stool, constipation, diarrhea, melena, nausea and vomiting.  Genitourinary: Negative for dysuria, frequency and urgency.  Musculoskeletal: Negative for back pain and joint pain.  Skin: Negative.  Negative for itching and rash.  Neurological: Positive for dizziness. Negative for tingling, focal weakness, weakness and headaches.  Endo/Heme/Allergies: Does not bruise/bleed easily.  Psychiatric/Behavioral: Negative for depression. The patient is not nervous/anxious and does not have insomnia.      MEDICAL HISTORY:  Past Medical History:  Diagnosis Date  . Alcohol use   . Tobacco abuse     SURGICAL HISTORY: History reviewed. No pertinent surgical history.  SOCIAL HISTORY: Social History   Socioeconomic History  . Marital status: Single    Spouse name: Not on file  . Number of children: Not on file  . Years of  education: Not on file  . Highest education level: Not on file  Occupational History  . Not on file  Tobacco Use  . Smoking status: Current Every Day Smoker  . Smokeless tobacco: Never Used  Substance and Sexual Activity  . Alcohol use: Yes  . Drug use: Never  . Sexual activity: Not on file  Other Topics Concern  . Not on file  Social History Narrative   Works part time- Midwife; live sin [place] with girl friend; 3-4 cig/day; beer.    Social Determinants of Health   Financial Resource Strain:   . Difficulty of Paying Living Expenses:   Food Insecurity:   . Worried About Programme researcher, broadcasting/film/video in the Last Year:   . Barista in the Last Year:   Transportation Needs:   . Freight forwarder (Medical):   Marland Kitchen Lack of Transportation (Non-Medical):   Physical Activity:   . Days of Exercise per Week:   . Minutes of Exercise per Session:   Stress:   . Feeling of Stress :   Social Connections:   . Frequency of Communication with Friends and Family:   . Frequency of Social Gatherings with Friends and Family:   . Attends Religious Services:   . Active Member of Clubs or Organizations:   . Attends Banker Meetings:   Marland Kitchen Marital Status:   Intimate Partner Violence:   . Fear of Current or Ex-Partner:   . Emotionally Abused:   Marland Kitchen Physically Abused:   . Sexually Abused:     FAMILY HISTORY: Family History  Problem Relation Age of Onset  . Cancer Mother        his mother  died of cancer, but patient is not sure which type of cancner.   . Diabetes Mellitus II Niece     ALLERGIES:  has No Known Allergies.  MEDICATIONS:  Current Facility-Administered Medications  Medication Dose Route Frequency Provider Last Rate Last Admin  . acetaminophen (TYLENOL) tablet 650 mg  650 mg Oral Q6H PRN Lorretta Harp, MD   650 mg at 02/29/20 1210   Or  . acetaminophen (TYLENOL) suppository 650 mg  650 mg Rectal Q6H PRN Lorretta Harp, MD      . folic acid (FOLVITE) tablet 1 mg  1  mg Oral Daily Lorretta Harp, MD   1 mg at 02/29/20 1018  . LORazepam (ATIVAN) injection 0-4 mg  0-4 mg Intravenous Q6H Lorretta Harp, MD       Followed by  . [START ON 03/01/2020] LORazepam (ATIVAN) injection 0-4 mg  0-4 mg Intravenous Q12H Lorretta Harp, MD      . LORazepam (ATIVAN) tablet 1-4 mg  1-4 mg Oral Q1H PRN Lorretta Harp, MD       Or  . LORazepam (ATIVAN) injection 1-4 mg  1-4 mg Intravenous Q1H PRN Lorretta Harp, MD      . multivitamin with minerals tablet 1 tablet  1 tablet Oral Daily Lorretta Harp, MD   1 tablet at 02/29/20 1018  . ondansetron (ZOFRAN) tablet 4 mg  4 mg Oral Q6H PRN Lorretta Harp, MD       Or  . ondansetron [facility]) injection 4 mg  4 mg Intravenous Q6H PRN Lorretta Harp, MD      . pantoprazole (PROTONIX) injection 40 mg  40 mg Intravenous Q12H Lorretta Harp, MD   40 mg at 02/29/20 1019  . sodium chloride flush (NS) 0.9 % injection 3 mL  3 mL Intravenous Once Lorretta Harp, MD      . thiamine tablet 100 mg  100 mg Oral Daily Lorretta Harp, MD   100 mg at 02/29/20 1018   Or  . thiamine (B-1) injection 100 mg  100 mg Intravenous Daily Lorretta Harp, MD   100 mg at 02/28/20 1439      .  PHYSICAL EXAMINATION:  Vitals:   02/29/20 1155 02/29/20 2031  BP: (!) 137/64 (!) 180/62  Pulse: 73 74  Resp: 16 20  Temp: 98 F (36.7 C) (!) 97.5 F (36.4 C)  SpO2: 100% 100%   Filed Weights   02/28/20 1635  Weight: 148 lb (67.1 kg)    Physical Exam HENT:     Head: Normocephalic and atraumatic.     Mouth/Throat:     Pharynx: No oropharyngeal exudate.  Eyes:     Pupils: Pupils are equal, round, and reactive to light.  Cardiovascular:     Rate and Rhythm: Normal rate and regular rhythm.  Pulmonary:     Effort: Pulmonary effort is normal. No respiratory distress.     Breath sounds: Normal breath sounds. No wheezing.  Abdominal:     General: Bowel sounds are normal. There is no distension.     Palpations: Abdomen is soft. There is no mass.     Tenderness: There is no abdominal tenderness. There  is no guarding or rebound.  Musculoskeletal:        General: No tenderness. Normal range of motion.     Cervical back: Normal range of motion and neck supple.  Skin:    General: Skin is warm.     Coloration: Skin is pale.  Neurological:     Mental Status: He  is alert and oriented to person, place, and time.  Psychiatric:        Mood and Affect: Affect normal.      LABORATORY DATA:  I have reviewed the data as listed Lab Results  Component Value Date   WBC 6.7 02/29/2020   HGB 9.5 (L) 02/29/2020   HCT 23.9 (L) 02/29/2020   MCV 71.8 (L) 02/29/2020   PLT 197 02/29/2020   Recent Labs    02/28/20 1008 02/29/20 0455  NA 139 139  K 3.8 3.9  CL 107 110  CO2 21* 21*  GLUCOSE 103* 98  BUN 12 8  CREATININE 1.03 0.86  CALCIUM 9.1 8.8*  GFRNONAA >60 >60  GFRAA >60 >60    RADIOGRAPHIC STUDIES: I have personally reviewed the radiological images as listed and agreed with the findings in the report. DG Chest 2 View  Result Date: 02/28/2020 CLINICAL DATA:  Dizziness, nausea, and SOB x a few weeks. Current smoker- 3-4 cigarettes per day. EXAM: CHEST - 2 VIEW COMPARISON:  None. FINDINGS: The heart size and mediastinal contours are within normal limits. Both lungs are clear. No pleural effusion or pneumothorax. The visualized skeletal structures are unremarkable. IMPRESSION: No active cardiopulmonary disease. Electronically Signed   By: Amie Portland M.D.   On: 02/28/2020 10:41    Iron deficiency #64 year old male patient admitted to the hospital for symptomatic shortness of breath on exertion fatigue noted to have severe anemia hemoglobin 3.8  #Severe anemia hemoglobin 3.8 on presentation-severe microcytosis; iron sat suggestive of severe iron deficiency. "Schistocytes" noted on peripheral smear-however do not suspect TTP or microangiopathic hemolytic anemia-as LDH is normal renal function is normal.  Etiology is unclear.   Recommendations:   #Agree with GI evaluation; patient  awaiting EGD colonoscopy in the morning.  Proceed with IV Venofer daily x2 while in the hospital.   #Patient will need close follow-up in the cancer center; for IV iron infusion; and also based on GI work-up.  Thank you DrSreenath for allowing me to participate in the care of your pleasant patient. Please do not hesitate to contact me with questions or concerns in the interim.     All questions were answered. The patient knows to call the clinic with any problems, questions or concerns.       Earna Coder, MD 02/29/2020 8:46 PM

## 2020-03-01 ENCOUNTER — Telehealth: Payer: Self-pay | Admitting: Internal Medicine

## 2020-03-01 ENCOUNTER — Encounter
Admission: EM | Disposition: A | Discharge status: Home / Self Care | Payer: Self-pay | Source: Home / Self Care | Attending: Internal Medicine

## 2020-03-01 ENCOUNTER — Other Ambulatory Visit: Payer: Self-pay | Admitting: Internal Medicine

## 2020-03-01 ENCOUNTER — Inpatient Hospital Stay: Payer: Self-pay | Admitting: Anesthesiology

## 2020-03-01 ENCOUNTER — Encounter: Payer: Self-pay | Admitting: Internal Medicine

## 2020-03-01 HISTORY — PX: COLONOSCOPY: SHX5424

## 2020-03-01 HISTORY — PX: ESOPHAGOGASTRODUODENOSCOPY: SHX5428

## 2020-03-01 LAB — TYPE AND SCREEN
ABO/RH(D): O POS
Antibody Screen: NEGATIVE
Unit division: 0
Unit division: 0
Unit division: 0
Unit division: 0

## 2020-03-01 LAB — BPAM RBC
Blood Product Expiration Date: 202108272359
Blood Product Expiration Date: 202108292359
Blood Product Expiration Date: 202108292359
Blood Product Expiration Date: 202108292359
ISSUE DATE / TIME: 202107311432
ISSUE DATE / TIME: 202107312024
ISSUE DATE / TIME: 202108010119
ISSUE DATE / TIME: 202108010456
Unit Type and Rh: 5100
Unit Type and Rh: 5100
Unit Type and Rh: 5100
Unit Type and Rh: 5100

## 2020-03-01 LAB — PATHOLOGIST SMEAR REVIEW

## 2020-03-01 LAB — HEMOGLOBIN
Hemoglobin: 9.5 g/dL — ABNORMAL LOW (ref 13.0–17.0)
Hemoglobin: 10.1 g/dL — ABNORMAL LOW (ref 13.0–17.0)

## 2020-03-01 LAB — GLUCOSE, CAPILLARY: Glucose-Capillary: 93 mg/dL (ref 70–99)

## 2020-03-01 SURGERY — EGD (ESOPHAGOGASTRODUODENOSCOPY)
Anesthesia: General

## 2020-03-01 MED ORDER — SODIUM CHLORIDE 0.9 % IV SOLN
INTRAVENOUS | Status: DC
  Administered 2020-03-01: 1000 mL via INTRAVENOUS

## 2020-03-01 MED ORDER — PROPOFOL 500 MG/50ML IV EMUL
INTRAVENOUS | Status: DC | PRN
  Administered 2020-03-01: 120 ug/kg/min via INTRAVENOUS

## 2020-03-01 MED ORDER — GLYCOPYRROLATE 0.2 MG/ML IJ SOLN
INTRAMUSCULAR | Status: AC
  Filled 2020-03-01: qty 1

## 2020-03-01 MED ORDER — GLYCOPYRROLATE 0.2 MG/ML IJ SOLN
INTRAMUSCULAR | Status: DC | PRN
  Administered 2020-03-01: .2 mg via INTRAVENOUS

## 2020-03-01 MED ORDER — MIDAZOLAM HCL 2 MG/2ML IJ SOLN
INTRAMUSCULAR | Status: AC
  Filled 2020-03-01: qty 2

## 2020-03-01 MED ORDER — MIDAZOLAM HCL 5 MG/5ML IJ SOLN
INTRAMUSCULAR | Status: DC | PRN
  Administered 2020-03-01: 2 mg via INTRAVENOUS

## 2020-03-01 MED ORDER — PROPOFOL 10 MG/ML IV BOLUS
INTRAVENOUS | Status: DC | PRN
  Administered 2020-03-01: 70 mg via INTRAVENOUS
  Administered 2020-03-01: 30 mg via INTRAVENOUS

## 2020-03-01 MED ORDER — IRON (FERROUS SULFATE) 325 (65 FE) MG PO TABS: 325.0000 mg | ORAL_TABLET | Freq: Every day | ORAL | 2 refills | Status: DC

## 2020-03-01 MED ORDER — LIDOCAINE HCL (PF) 2 % IJ SOLN
INTRAMUSCULAR | Status: AC
  Filled 2020-03-01: qty 5

## 2020-03-01 NOTE — Discharge Instructions (Signed)
Anemia  Anemia is a condition in which you do not have enough red blood cells or hemoglobin. Hemoglobin is a substance in red blood cells that carries oxygen. When you do not have enough red blood cells or hemoglobin (are anemic), your body cannot get enough oxygen and your organs may not work properly. As a result, you may feel very tired or have other problems. What are the causes? Common causes of anemia include:  Excessive bleeding. Anemia can be caused by excessive bleeding inside or outside the body, including bleeding from the intestine or from periods in women.  Poor nutrition.  Long-lasting (chronic) kidney, thyroid, and liver disease.  Bone marrow disorders.  Cancer and treatments for cancer.  HIV (human immunodeficiency virus) and AIDS (acquired immunodeficiency syndrome).  Treatments for HIV and AIDS.  Spleen problems.  Blood disorders.  Infections, medicines, and autoimmune disorders that destroy red blood cells. What are the signs or symptoms? Symptoms of this condition include:  Minor weakness.  Dizziness.  Headache.  Feeling heartbeats that are irregular or faster than normal (palpitations).  Shortness of breath, especially with exercise.  Paleness.  Cold sensitivity.  Indigestion.  Nausea.  Difficulty sleeping.  Difficulty concentrating. Symptoms may occur suddenly or develop slowly. If your anemia is mild, you may not have symptoms. How is this diagnosed? This condition is diagnosed based on:  Blood tests.  Your medical history.  A physical exam.  Bone marrow biopsy. Your health care provider may also check your stool (feces) for blood and may do additional testing to look for the cause of your bleeding. You may also have other tests, including:  Imaging tests, such as a CT scan or MRI.  Endoscopy.  Colonoscopy. How is this treated? Treatment for this condition depends on the cause. If you continue to lose a lot of blood, you may  need to be treated at a hospital. Treatment may include:  Taking supplements of iron, vitamin S31, or folic acid.  Taking a hormone medicine (erythropoietin) that can help to stimulate red blood cell growth.  Having a blood transfusion. This may be needed if you lose a lot of blood.  Making changes to your diet.  Having surgery to remove your spleen. Follow these instructions at home:  Take over-the-counter and prescription medicines only as told by your health care provider.  Take supplements only as told by your health care provider.  Follow any diet instructions that you were given.  Keep all follow-up visits as told by your health care provider. This is important. Contact a health care provider if:  You develop new bleeding anywhere in the body. Get help right away if:  You are very weak.  You are short of breath.  You have pain in your abdomen or chest.  You are dizzy or feel faint.  You have trouble concentrating.  You have bloody or black, tarry stools.  You vomit repeatedly or you vomit up blood. Summary  Anemia is a condition in which you do not have enough red blood cells or enough of a substance in your red blood cells that carries oxygen (hemoglobin).  Symptoms may occur suddenly or develop slowly.  If your anemia is mild, you may not have symptoms.  This condition is diagnosed with blood tests as well as a medical history and physical exam. Other tests may be needed.  Treatment for this condition depends on the cause of the anemia. This information is not intended to replace advice given to you by  your health care provider. Make sure you discuss any questions you have with your health care provider. Document Revised: 06/29/2017 Document Reviewed: 08/18/2016 Elsevier Patient Education  [place].

## 2020-03-01 NOTE — Discharge Summary (Signed)
Physician Discharge Summary  Alan Everett SJG:[id number] DOB: 05/13/1956 DOA: 02/28/2020  PCP: Patient, No Pcp Per  Admit date: 02/28/2020 Discharge date: 03/01/2020  Admitted From: Home Disposition:  Home  Recommendations for Outpatient Follow-up:  1. Follow up with PCP in 1-2 weeks 2. Follow up with GI on 04/05/20.  Call Dr. Horace Everett office to confirm appt date and time  Home Health:No Equipment/Devices:None Discharge Condition:Stable CODE STATUS:Full Diet recommendation: Heart Healthy  Brief/Interim Summary: MLY:YTKPT Everett a 63 y.o.malewithout significant past medical history except for tobacco abuseand alcohol use, who presents with shortness of breath and lightheadedness.  Patient states that he has been having shortness breath, dizziness, lightheadedness for about 1 week, which has been progressively worsening.Patient does not have cough, chest pain, fever or chills. He has nausea, but no vomiting, diarrhea or abdominal pain. No symptoms of UTI or unilateral weakness. Denies dark stool or bloody stool. Patient states that he is generally healthy, not taking prescribed medications. He occasionally take Tylenol and over-the-counter pain medications. Henever had an EGD or colonoscopy before. His partner reported that the patient drinks beers almost every day,some times hedrinks 6 beers at onetime.   8/1: Patient seen and examined.  Status post 4 units packed red blood cells.  Hemoglobin responded from initial value of 3.4-7.7 today.  Seen by gastroenterology.  Plan for upper and lower endoscopy tomorrow.  8/2: Patient was seen and examined on the day of discharge.  He underwent upper endoscopy and colonoscopy with Dr. Norma Everett.  No bleeding or masses noted.  Cleared for discharge from GI standpoint.  As patient's hemoglobin has recovered and patient has not shown no signs of acute bleed he is stable for discharge at this time.  Dr. Horace Everett office will arrange follow-up.   Follow-up instructions have been included in discharge packet.  Given iron deficiency noted on laboratory investigation will initiate iron sulfate 325 mg daily.  Prescription written and sent to outpatient pharmacy.  After confirmation of tolerance of p.o. intake patient is discharged home in stable condition.   Discharge Diagnoses:  Principal Problem:   Symptomatic anemia Active Problems:   Tobacco abuse   Microcytic anemia   Alcohol use   Iron deficiency  Symptomatic anemia Normocytic anemia with schistocytes Possible GI bleed Iron deficiency anemia Patient presented with weakness and fatigue worsening over the past several weeks.  Found to have hemoglobin of 3.4 on admission.  Given 4 units packed red blood cells with appropriate response of hemoglobin.  Gastroenterology consulted from admission.  Hematology also consulted from admission.  Peripheral smear demonstrating schistocytes.  Given no hemolysis not suggestive of microangiopathic hemolytic anemia or TTP.  Appears to be chronic iron deficiency. -Patient underwent successful colonoscopy and EGD with Dr. Norma Everett -No bleeding or masses noted -Cleared for discharge from GI standpoint -As patient is not actively bleeding and hemoglobin is stabilized will discharge home- -we will initiate ferrous sulfate 3 and 25 mg daily for iron deficiency -Follow-up with GI arranged through Dr. Horace Everett office   Tobacco abuse Counseled patient Offer nicotine patch patient refused  Alcohol abuse No clinical indicators withdrawal at this time Maintain CIWA protocol No signs of withdrawal during hospitalization   Discharge Instructions  Discharge Instructions    Diet - low sodium heart healthy   Complete by: As directed    Increase activity slowly   Complete by: As directed      Allergies as of 03/01/2020   No Known Allergies     Medication List  STOP taking these medications   ibuprofen 200 MG tablet Commonly known as:  ADVIL     TAKE these medications   acetaminophen 500 MG tablet Commonly known as: TYLENOL Take 500-1,000 mg by mouth every 6 (six) hours as needed for mild pain, moderate pain or fever.   Iron (Ferrous Sulfate) 325 (65 Fe) MG Tabs Take 325 mg by mouth daily.       Follow-up Information    Alan Kidney, MD. Go on 04/05/2020.   Specialty: Gastroenterology Why: Call Dr. Horace Everett office to confirm followup appt date and time Contact information: [address] [facility] [address] [facility] [address] [place] [address] [phone]              No Known Allergies  Consultations:  GI   Procedures/Studies: DG Chest 2 View  Result Date: 02/28/2020 CLINICAL DATA:  Dizziness, nausea, and SOB x a few weeks. Current smoker- 3-4 cigarettes per day. EXAM: CHEST - 2 VIEW COMPARISON:  None. FINDINGS: The heart size and mediastinal contours are within normal limits. Both lungs are clear. No pleural effusion or pneumothorax. The visualized skeletal structures are unremarkable. IMPRESSION: No active cardiopulmonary disease. Electronically Signed   By: Alan Everett M.D.   On: 02/28/2020 10:41    (Echo, Carotid, EGD, Colonoscopy, ERCP)    Subjective: Seen and examined on day of discharge.  No complaints.  After endoscopy patient is cleared for discharge home.  Follow-up with GI as directed.  Discharge Exam: Vitals:   03/01/20 1427 03/01/20 1501  BP: 126/73 132/66  Pulse: 85 76  Resp: 17 16  Temp:  97.8 F (36.6 C)  SpO2: 100% 100%   Vitals:   03/01/20 1407 03/01/20 1417 03/01/20 1427 03/01/20 1501  BP: (!) 113/58 125/71 126/73 132/66  Pulse: 77 94 85 76  Resp: 16 17 17 16   Temp:    97.8 F (36.6 C)  TempSrc:    Oral  SpO2: 99% 100% 100% 100%  Weight:      Height:        General: Pt is alert, awake, not in acute distress Cardiovascular: RRR, S1/S2 +, no rubs, no gallops Respiratory: CTA bilaterally, no wheezing, no rhonchi Abdominal: Soft, NT, ND, bowel sounds + Extremities: no edema, no  cyanosis    The results of significant diagnostics from this hospitalization (including imaging, microbiology, ancillary and laboratory) are listed below for reference.     Microbiology: Recent Results (from the past 240 hour(s))  SARS Coronavirus 2 by RT PCR (hospital order, performed in [facility] hospital lab) Nasopharyngeal Nasopharyngeal Swab     Status: None   Collection Time: 02/28/20 11:41 AM   Specimen: Nasopharyngeal Swab  Result Value Ref Range Status   SARS Coronavirus 2 NEGATIVE NEGATIVE Final    Comment: (NOTE) SARS-CoV-2 target nucleic acids are NOT DETECTED.  The SARS-CoV-2 RNA is generally detectable in upper and lower respiratory specimens during the acute phase of infection. The lowest concentration of SARS-CoV-2 viral copies this assay can detect is 250 copies / mL. A negative result does not preclude SARS-CoV-2 infection and should not be used as the sole basis for treatment or other patient management decisions.  A negative result may occur with improper specimen collection / handling, submission of specimen other than nasopharyngeal swab, presence of viral mutation(s) within the areas targeted by this assay, and inadequate number of viral copies (<250 copies / mL). A negative result must be combined with clinical observations, patient history, and epidemiological information.  Fact Sheet for Patients:  BoilerBrush.com.cy  Fact Sheet for Healthcare Providers: https://pope.com/  This test is not yet approved or  cleared by the [place] FDA and has been authorized for detection and/or diagnosis of SARS-CoV-2 by FDA under an Emergency Use Authorization (EUA).  This EUA will remain in effect (meaning this test can be used) for the duration of the COVID-19 declaration under Section 564(b)(1) of the Act, 21 U.S.C. section 360bbb-3(b)(1), unless the authorization is terminated or revoked sooner.  Performed  at [facility], [address] [place], [place] [address]      Labs: BNP (last 3 results) No results for input(s): BNP in the last 8760 hours. Basic Metabolic Panel: Recent Labs  Lab 02/28/20 1008 02/28/20 1155 02/29/20 0455  NA 139  --  139  K 3.8  --  3.9  CL 107  --  110  CO2 21*  --  21*  GLUCOSE 103*  --  98  BUN 12  --  8  CREATININE 1.03  --  0.86  CALCIUM 9.1  --  8.8*  MG  --  1.9  --    Liver Function Tests: No results for input(s): AST, ALT, ALKPHOS, BILITOT, PROT, ALBUMIN in the last 168 hours. No results for input(s): LIPASE, AMYLASE in the last 168 hours. No results for input(s): AMMONIA in the last 168 hours. CBC: Recent Labs  Lab 02/28/20 1114 02/28/20 1114 02/28/20 1155 02/28/20 1155 02/28/20 1831 02/28/20 1831 02/29/20 0105 02/29/20 0105 02/29/20 0455 02/29/20 0455 02/29/20 0940 02/29/20 1438 02/29/20 2053 03/01/20 0403 03/01/20 0846  WBC 5.0  --  4.1  --  5.2  --  5.3  --  6.7  --   --   --   --   --   --   HGB 3.4*   < > 3.4*   < > 4.9*   < > 5.9*   < > 7.7*   < > 9.7* 9.5* 10.1* 9.5* 10.1*  HCT 13.1*  --  13.0*  --  17.4*  --  19.8*  --  23.9*  --   --   --   --   --   --   MCV 65.8*  --  64.7*  --  70.2*  --  72.8*  --  71.8*  --   --   --   --   --   --   PLT 254  --  219  --  256  --  211  --  197  --   --   --   --   --   --    < > = values in this interval not displayed.   Cardiac Enzymes: No results for input(s): CKTOTAL, CKMB, CKMBINDEX, TROPONINI in the last 168 hours. BNP: Invalid input(s): POCBNP CBG: Recent Labs  Lab 02/29/20 0820 03/01/20 0801  GLUCAP 99 93   D-Dimer No results for input(s): DDIMER in the last 72 hours. Hgb A1c No results for input(s): HGBA1C in the last 72 hours. Lipid Profile No results for input(s): CHOL, HDL, LDLCALC, TRIG, CHOLHDL, LDLDIRECT in the last 72 hours. Thyroid function studies No results for input(s): TSH, T4TOTAL, T3FREE, THYROIDAB in the last 72 hours.  Invalid  input(s): FREET3 Anemia work up Recent Labs    02/29/20 0105 02/29/20 0111  VITAMINB12  --  435  FOLATE 12.0  --   FERRITIN 3*  --   TIBC 476*  --   IRON 43*  --   RETICCTPCT 1.9  --  Urinalysis No results found for: COLORURINE, APPEARANCEUR, LABSPEC, PHURINE, GLUCOSEU, HGBUR, BILIRUBINUR, KETONESUR, PROTEINUR, UROBILINOGEN, NITRITE, LEUKOCYTESUR Sepsis Labs Invalid input(s): PROCALCITONIN,  WBC,  LACTICIDVEN Microbiology Recent Results (from the past 240 hour(s))  SARS Coronavirus 2 by RT PCR (hospital order, [facility]) Nasopharyngeal Nasopharyngeal Swab     Status: None   Collection Time: 02/28/20 11:41 AM   Specimen: Nasopharyngeal Swab  Result Value Ref Range Status   SARS Coronavirus 2 NEGATIVE NEGATIVE Final    Comment: (NOTE) SARS-CoV-2 target nucleic acids are NOT DETECTED.  The SARS-CoV-2 RNA is generally detectable in upper and lower respiratory specimens during the acute phase of infection. The lowest concentration of SARS-CoV-2 viral copies this assay can detect is 250 copies / mL. A negative result does not preclude SARS-CoV-2 infection and should not be used as the sole basis for treatment or other patient management decisions.  A negative result may occur with improper specimen collection / handling, submission of specimen other than nasopharyngeal swab, presence of viral mutation(s) within the areas targeted by this assay, and inadequate number of viral copies (<250 copies / mL). A negative result must be combined with clinical observations, patient history, and epidemiological information.  Fact Sheet for Patients:   BoilerBrush.com.cyhttps://www.fda.gov/media/136312/download  Fact Sheet for Healthcare Providers: https://pope.com/https://www.fda.gov/media/136313/download  This test is not yet approved or  cleared by the [place] FDA and has been authorized for detection and/or diagnosis of SARS-CoV-2 by FDA under an Emergency Use Authorization (EUA).   This EUA will remain in effect (meaning this test can be used) for the duration of the COVID-19 declaration under Section 564(b)(1) of the Act, 21 U.S.C. section 360bbb-3(b)(1), unless the authorization is terminated or revoked sooner.  Performed at [facility], [address] [place], [place] [address]      Time coordinating discharge: Over 30 minutes  SIGNED:   Tresa MooreSudheer B Allisyn Kunz, MD  [facility] 03/01/2020, 5:12 PM Pager   If 7PM-7AM, please contact night-coverage

## 2020-03-01 NOTE — Op Note (Signed)
[facility] Patient Name: Alan Everett Procedure Date: 03/01/2020 1:22 PM MRN: [id number] Account #: [id number] Date of Birth: 01-02-1956 Admit Type: Outpatient Age: 64 Room: [facility] ENDO ROOM 3 Gender: Male Note Status: Finalized Procedure:             Upper GI endoscopy Indications:           Iron deficiency anemia secondary to chronic blood loss Providers:             Boykin Nearing. Delayla Hoffmaster MD, MD Medicines:             Propofol per Anesthesia Complications:         No immediate complications. Procedure:             Pre-Anesthesia Assessment:                        - The risks and benefits of the procedure and the                         sedation options and risks were discussed with the                         patient. All questions were answered and informed                         consent was obtained.                        - Patient identification and proposed procedure were                         verified prior to the procedure by the nurse. The                         procedure was verified in the procedure room.                        - ASA Grade Assessment: III - A patient with severe                         systemic disease.                        - After reviewing the risks and benefits, the patient                         was deemed in satisfactory condition to undergo the                         procedure.                        After obtaining informed consent, the endoscope was                         passed under direct vision. Throughout the procedure,                         the patient's blood pressure, pulse, and oxygen  saturations were monitored continuously. The Endoscope                         was introduced through the mouth, and advanced to the                         third part of duodenum. The upper GI endoscopy was                         accomplished without difficulty. The patient tolerated                          the procedure well. Findings:      The examined esophagus was normal.      Patchy moderate inflammation characterized by congestion (edema) and       erythema was found in the gastric antrum.      The cardia and gastric fundus were normal on retroflexion.      The examined duodenum was normal.      The exam was otherwise without abnormality. Impression:            - Normal esophagus.                        - Gastritis.                        - Normal examined duodenum.                        - The examination was otherwise normal.                        - No specimens collected. Recommendation:        - Proceed with colonoscopy Procedure Code(s):     --- Professional ---                        [phone], Esophagogastroduodenoscopy, flexible,                         transoral; diagnostic, including collection of                         specimen(s) by brushing or washing, when performed                         (separate procedure) Diagnosis Code(s):     --- Professional ---                        D50.0, Iron deficiency anemia secondary to blood loss                         (chronic)                        K29.70, Gastritis, unspecified, without bleeding CPT copyright 2019 American Medical Association. All rights reserved. The codes documented in this report are preliminary and upon coder review may  be revised to meet current compliance requirements. Stanton Kidney MD, MD 03/01/2020 1:41:28 PM This report has been signed electronically. Number of Addenda: 0 Note  Initiated On: 03/01/2020 1:22 PM Estimated Blood Loss:  Estimated blood loss: none.      [facility]

## 2020-03-01 NOTE — Anesthesia Postprocedure Evaluation (Signed)
Anesthesia Post Note  Patient: Alan Everett  Procedure(s) Performed: ESOPHAGOGASTRODUODENOSCOPY (EGD) (N/A ) COLONOSCOPY (N/A )  Patient location during evaluation: Endoscopy Anesthesia Type: General Level of consciousness: awake and alert and oriented Pain management: pain level controlled Vital Signs Assessment: post-procedure vital signs reviewed and stable Respiratory status: spontaneous breathing, nonlabored ventilation and respiratory function stable Cardiovascular status: blood pressure returned to baseline and stable Postop Assessment: no signs of nausea or vomiting Anesthetic complications: no   No complications documented.   Last Vitals:  Vitals:   03/01/20 1417 03/01/20 1427  BP: 125/71 126/73  Pulse: 94 85  Resp: 17 17  Temp:    SpO2: 100% 100%    Last Pain:  Vitals:   03/01/20 1427  TempSrc:   PainSc: 0-No pain                 Shiv Shuey

## 2020-03-01 NOTE — Anesthesia Preprocedure Evaluation (Addendum)
Anesthesia Evaluation  Patient identified by MRN, date of birth, ID band Patient awake    Reviewed: Allergy & Precautions, NPO status , Patient's Chart, lab work & pertinent test results  History of Anesthesia Complications Negative for: history of anesthetic complications  Airway Mallampati: II  TM Distance: >3 FB Neck ROM: Full    Dental  (+) Poor Dentition, Missing   Pulmonary neg sleep apnea, neg COPD, Current Smoker and Patient abstained from smoking.,    breath sounds clear to auscultation- rhonchi (-) wheezing      Cardiovascular (-) hypertension(-) CAD, (-) Past MI, (-) Cardiac Stents and (-) CABG negative cardio ROS   Rhythm:Regular Rate:Normal - Systolic murmurs and - Diastolic murmurs    Neuro/Psych neg Seizures negative neurological ROS  negative psych ROS   GI/Hepatic negative GI ROS, Neg liver ROS,   Endo/Other  negative endocrine ROSneg diabetes  Renal/GU negative Renal ROS     Musculoskeletal negative musculoskeletal ROS (+)   Abdominal (+) - obese,   Peds  Hematology  (+) anemia ,   Anesthesia Other Findings Past Medical History: No date: Alcohol use No date: Tobacco abuse   Reproductive/Obstetrics                            Anesthesia Physical Anesthesia Plan  ASA: II  Anesthesia Plan: General   Post-op Pain Management:    Induction: Intravenous  PONV Risk Score and Plan: 0 and Propofol infusion  Airway Management Planned: Natural Airway  Additional Equipment:   Intra-op Plan:   Post-operative Plan:   Informed Consent: I have reviewed the patients History and Physical, chart, labs and discussed the procedure including the risks, benefits and alternatives for the proposed anesthesia with the patient or authorized representative who has indicated his/her understanding and acceptance.     Dental advisory given  Plan Discussed with: CRNA and  Anesthesiologist  Anesthesia Plan Comments:         Anesthesia Quick Evaluation

## 2020-03-01 NOTE — Progress Notes (Signed)
Pt completed bowel prep at 2200, 02/29/2020. Good results, stool yellow in color clear, tol prep

## 2020-03-01 NOTE — Telephone Encounter (Signed)
M- Please schedule follow-up-next 1 week or so-; MD; labs CBC; IV Venofer.   Thanks, GB

## 2020-03-01 NOTE — Progress Notes (Signed)
Alan Everett  A and O x 4. VSS. Pt tolerating diet well. No complaints of pain or nausea. IV removed intact, prescriptions given. Pt voiced understanding of discharge instructions with no further questions. Pt discharged home.    Allergies as of 03/01/2020   No Known Allergies     Medication List    STOP taking these medications   ibuprofen 200 MG tablet Commonly known as: ADVIL     TAKE these medications   acetaminophen 500 MG tablet Commonly known as: TYLENOL Take 500-1,000 mg by mouth every 6 (six) hours as needed for mild pain, moderate pain or fever.   Iron (Ferrous Sulfate) 325 (65 Fe) MG Tabs Take 325 mg by mouth daily.       Vitals:   03/01/20 1427 03/01/20 1501  BP: 126/73 132/66  Pulse: 85 76  Resp: 17 16  Temp:  97.8 F (36.6 C)  SpO2: 100% 100%    Suzzanne Cloud

## 2020-03-01 NOTE — Progress Notes (Signed)
Alan Everett   DOB:10/17/1955   NU#:[id number]    Subjective: Patient resting comfortably in the bed.  Multiple loose stools secondary to colonoscopy preparations.  awaiting colonoscopy/EGD later in the afternoon.  Objective:  Vitals:   03/01/20 1427 03/01/20 1501  BP: 126/73 132/66  Pulse: 85 76  Resp: 17 16  Temp:  97.8 F (36.6 C)  SpO2: 100% 100%     Intake/Output Summary (Last 24 hours) at 03/01/2020 2011 Last data filed at 03/01/2020 1540 Gross per 24 hour  Intake 400 ml  Output 400 ml  Net 0 ml    Physical Exam HENT:     Head: Normocephalic and atraumatic.     Mouth/Throat:     Pharynx: No oropharyngeal exudate.  Eyes:     Pupils: Pupils are equal, round, and reactive to light.  Cardiovascular:     Rate and Rhythm: Normal rate and regular rhythm.  Pulmonary:     Effort: No respiratory distress.     Breath sounds: No wheezing.  Abdominal:     General: Bowel sounds are normal. There is no distension.     Palpations: Abdomen is soft. There is no mass.     Tenderness: There is no abdominal tenderness. There is no guarding or rebound.  Musculoskeletal:        General: No tenderness. Normal range of motion.     Cervical back: Normal range of motion and neck supple.  Skin:    General: Skin is warm.  Neurological:     Mental Status: He is alert and oriented to person, place, and time.  Psychiatric:        Mood and Affect: Affect normal.      Labs:  Lab Results  Component Value Date   WBC 6.7 02/29/2020   HGB 10.1 (L) 03/01/2020   HCT 23.9 (L) 02/29/2020   MCV 71.8 (L) 02/29/2020   PLT 197 02/29/2020    Lab Results  Component Value Date   NA 139 02/29/2020   K 3.9 02/29/2020   CL 110 02/29/2020   CO2 21 (L) 02/29/2020    Studies:  No results found.  Iron deficiency #64 year old male patient admitted to the hospital for symptomatic shortness of breath on exertion fatigue noted to have severe anemia hemoglobin 3.8  #Severe anemia hemoglobin 3.8 on  presentation-severe microcytosis; post PRBC transfusion hemoglobin is around 9.  Iron sat suggestive of severe iron deficiency-etiology unclear.  EGD colonoscopy-this afternoon.  #Addendum-patient's EGD colonoscopy negative for any acute process to explain patient's severe anemia.  Would recommend follow-up in the cancer center in the next 1 week for IV iron infusions.  Patient also need CT scan of the abdomen pelvis to further work-up iron deficient anemia/we will plan outpatient.    Earna Coder, MD 03/01/2020  8:11 PM

## 2020-03-01 NOTE — Op Note (Signed)
[facility] Gastroenterology Patient Name: Alan Everett Procedure Date: 03/01/2020 1:21 PM MRN: [id number] Account #: [id number] Date of Birth: 1956/02/01 Admit Type: Outpatient Age: 64 Room: [facility] ENDO ROOM 3 Gender: Male Note Status: Finalized Procedure:             Colonoscopy Indications:           Unexplained iron deficiency anemia Providers:             Boykin Nearing. Brynnlee Cumpian MD, MD Medicines:             Propofol per Anesthesia Complications:         No immediate complications. Procedure:             Pre-Anesthesia Assessment:                        - The risks and benefits of the procedure and the                         sedation options and risks were discussed with the                         patient. All questions were answered and informed                         consent was obtained.                        - Patient identification and proposed procedure were                         verified prior to the procedure by the nurse. The                         procedure was verified in the procedure room.                        - ASA Grade Assessment: III - A patient with severe                         systemic disease.                        - After reviewing the risks and benefits, the patient                         was deemed in satisfactory condition to undergo the                         procedure.                        After obtaining informed consent, the colonoscope was                         passed under direct vision. Throughout the procedure,                         the patient's blood pressure, pulse, and oxygen  saturations were monitored continuously. The                         Colonoscope was introduced through the anus and                         advanced to the the cecum, identified by appendiceal                         orifice and ileocecal valve. The colonoscopy was                         performed without  difficulty. The patient tolerated                         the procedure well. The quality of the bowel                         preparation was excellent. The ileocecal valve,                         appendiceal orifice, and rectum were photographed. Findings:      The perianal and digital rectal examinations were normal. Pertinent       negatives include normal sphincter tone and no palpable rectal lesions.      A few small-mouthed diverticula were found in the sigmoid colon.      Normal mucosa was found in the entire colon.      There is no endoscopic evidence of bleeding, erythema, mass, polyps or       ulcerations in the entire colon. Impression:            - Diverticulosis in the sigmoid colon.                        - Normal mucosa in the entire examined colon.                        - No specimens collected. Recommendation:        - Patient has a contact number available for                         emergencies. The signs and symptoms of potential                         delayed complications were discussed with the patient.                         Return to normal activities tomorrow. Written                         discharge instructions were provided to the patient.                        - Resume previous diet.                        - Continue present medications.                        -  To visualize the small bowel, perform video capsule                         endoscopy at appointment to be scheduled.                        - Advance diet.                        Okay to discharge home from a GI standpoint after                         transfusions that are necessary.                        - Return to my office in 5 weeks.                        - Follow up with Jacob Moores, PA-C in [ ]  months. Procedure Code(s):     --- Professional ---                        [phone], Colonoscopy, flexible; diagnostic, including                         collection of specimen(s) by brushing  or washing, when                         performed (separate procedure) Diagnosis Code(s):     --- Professional ---                        D50.9, Iron deficiency anemia, unspecified                        K57.30, Diverticulosis of large intestine without                         perforation or abscess without bleeding CPT copyright 2019 American Medical Association. All rights reserved. The codes documented in this report are preliminary and upon coder review may  be revised to meet current compliance requirements. 2020 MD, MD 03/01/2020 1:58:10 PM This report has been signed electronically. Number of Addenda: 0 Note Initiated On: 03/01/2020 1:21 PM Scope Withdrawal Time: 0 hours 6 minutes 41 seconds  Total Procedure Duration: 0 hours 9 minutes 46 seconds  Estimated Blood Loss:  Estimated blood loss: none.      [facility]

## 2020-03-01 NOTE — Interval H&P Note (Signed)
History and Physical Interval Note:  03/01/2020 1:27 PM  Alan Everett  has presented today for surgery, with the diagnosis of Symptomatic anemia, heme positive stool, chronic NSAID use.  The various methods of treatment have been discussed with the patient and family. After consideration of risks, benefits and other options for treatment, the patient has consented to  Procedure(s): ESOPHAGOGASTRODUODENOSCOPY (EGD) (N/A) COLONOSCOPY (N/A) as a surgical intervention.  The patient's history has been reviewed, patient examined, no change in status, stable for surgery.  I have reviewed the patient's chart and labs.  Questions were answered to the patient's satisfaction.     [place], [place]

## 2020-03-01 NOTE — Transfer of Care (Signed)
Immediate Anesthesia Transfer of Care Note  Patient: Alan Everett  Procedure(s) Performed: ESOPHAGOGASTRODUODENOSCOPY (EGD) (N/A ) COLONOSCOPY (N/A )  Patient Location: Endoscopy Unit  Anesthesia Type:General  Level of Consciousness: sedated  Airway & Oxygen Therapy: Patient Spontanous Breathing  Post-op Assessment: Report given to RN and Post -op Vital signs reviewed and stable  Post vital signs: Reviewed  Last Vitals:  Vitals Value Taken Time  BP 102/60 03/01/20 1357  Temp 36 C 03/01/20 1357  Pulse 81 03/01/20 1359  Resp 17 03/01/20 1359  SpO2 98 % 03/01/20 1359  Vitals shown include unvalidated device data.  Last Pain:  Vitals:   03/01/20 1357  TempSrc:   PainSc: Asleep      Patients Stated Pain Goal: 0 (03/01/20 1316)  Complications: No complications documented.

## 2020-03-02 NOTE — Telephone Encounter (Signed)
Colette, Please arrange for the IV venofer

## 2020-03-10 ENCOUNTER — Inpatient Hospital Stay: Payer: Self-pay

## 2020-03-10 ENCOUNTER — Inpatient Hospital Stay: Payer: Self-pay | Attending: Internal Medicine

## 2020-03-10 ENCOUNTER — Encounter: Payer: Self-pay | Admitting: Internal Medicine

## 2020-03-10 ENCOUNTER — Other Ambulatory Visit: Payer: Self-pay

## 2020-03-10 ENCOUNTER — Inpatient Hospital Stay (HOSPITAL_BASED_OUTPATIENT_CLINIC_OR_DEPARTMENT_OTHER): Payer: Self-pay | Admitting: Internal Medicine

## 2020-03-10 VITALS — BP 177/65 | HR 83 | Temp 98.8°F | Resp 16 | Ht 66.0 in | Wt 142.0 lb

## 2020-03-10 DIAGNOSIS — E611 Iron deficiency: Secondary | ICD-10-CM

## 2020-03-10 DIAGNOSIS — R5383 Other fatigue: Secondary | ICD-10-CM | POA: Insufficient documentation

## 2020-03-10 DIAGNOSIS — D509 Iron deficiency anemia, unspecified: Secondary | ICD-10-CM

## 2020-03-10 DIAGNOSIS — R0602 Shortness of breath: Secondary | ICD-10-CM | POA: Insufficient documentation

## 2020-03-10 DIAGNOSIS — Z809 Family history of malignant neoplasm, unspecified: Secondary | ICD-10-CM | POA: Insufficient documentation

## 2020-03-10 DIAGNOSIS — K921 Melena: Secondary | ICD-10-CM | POA: Insufficient documentation

## 2020-03-10 DIAGNOSIS — R1013 Epigastric pain: Secondary | ICD-10-CM | POA: Insufficient documentation

## 2020-03-10 DIAGNOSIS — Z833 Family history of diabetes mellitus: Secondary | ICD-10-CM | POA: Insufficient documentation

## 2020-03-10 DIAGNOSIS — K5900 Constipation, unspecified: Secondary | ICD-10-CM | POA: Insufficient documentation

## 2020-03-10 DIAGNOSIS — F17210 Nicotine dependence, cigarettes, uncomplicated: Secondary | ICD-10-CM | POA: Insufficient documentation

## 2020-03-10 DIAGNOSIS — Z79899 Other long term (current) drug therapy: Secondary | ICD-10-CM | POA: Insufficient documentation

## 2020-03-10 DIAGNOSIS — Z7289 Other problems related to lifestyle: Secondary | ICD-10-CM | POA: Insufficient documentation

## 2020-03-10 LAB — CBC WITH DIFFERENTIAL/PLATELET
Abs Immature Granulocytes: 0.02 10*3/uL (ref 0.00–0.07)
Basophils Absolute: 0.1 10*3/uL (ref 0.0–0.1)
Basophils Relative: 2 %
Eosinophils Absolute: 0.3 10*3/uL (ref 0.0–0.5)
Eosinophils Relative: 4 %
HCT: 32 % — ABNORMAL LOW (ref 39.0–52.0)
Hemoglobin: 9.7 g/dL — ABNORMAL LOW (ref 13.0–17.0)
Immature Granulocytes: 0 %
Lymphocytes Relative: 29 %
Lymphs Abs: 1.7 10*3/uL (ref 0.7–4.0)
MCH: 24.3 pg — ABNORMAL LOW (ref 26.0–34.0)
MCHC: 30.3 g/dL (ref 30.0–36.0)
MCV: 80.2 fL (ref 80.0–100.0)
Monocytes Absolute: 1.2 10*3/uL — ABNORMAL HIGH (ref 0.1–1.0)
Monocytes Relative: 21 %
Neutro Abs: 2.6 10*3/uL (ref 1.7–7.7)
Neutrophils Relative %: 44 %
Platelets: 220 10*3/uL (ref 150–400)
RBC: 3.99 MIL/uL — ABNORMAL LOW (ref 4.22–5.81)
RDW: 26.7 % — ABNORMAL HIGH (ref 11.5–15.5)
WBC: 5.9 10*3/uL (ref 4.0–10.5)
nRBC: 0 % (ref 0.0–0.2)

## 2020-03-10 NOTE — Patient Instructions (Signed)
#   TAKE VITRON C [65 mg tablet] once a day; if having problems with constipation- recommend taking one every other day.

## 2020-03-10 NOTE — Assessment & Plan Note (Addendum)
#   Severe anemia hemoglobin 3.8 [july2021]-iron deficiency-s/p PRBC transfusion; recommend IV iron infusions.  Today hemoglobin is 9.7.  Discussed the potential acute infusion reactions with IV iron; which are quite rare.  Patient understands the risk; DECLINES infusions given the concerns.   # Constipation/black stool/dyspepsia- sec to PO iron; HOLD off PO iron.   #Etiology of iron deficiency-unclear EGD colonoscopy negative; awaiting capsule study.  Discussed with GI.  Given the absence of any obvious explanation for his anemia and endoscopies I would recommend a CT scan abdomen pelvis with contrast. Pt reluctant [bills/lack of insurance].  Currently awaiting appointment with [facility]  # DISPOSITION: # HOLD VENOFER # follow up in 4 weeks MD- labs- cbc;possible/venofer-dr.B

## 2020-03-10 NOTE — Progress Notes (Signed)
[facility] CONSULT NOTE  Patient Care Team: Patient, No Pcp Per as PCP - General (General Practice) Earna Coder, MD as Consulting Physician (Hematology and Oncology)  CHIEF COMPLAINTS/PURPOSE OF CONSULTATION: Iron deficient anemia  #Iron deficient anemia- [July 2021-hemoglobin 3.8]; 2 units of PRBC transfusion; EGD colonoscopy [Dr. Toledo]-negative; capsule pending  Oncology History   No history exists.     HISTORY OF PRESENTING ILLNESS:  Alan Everett 64 y.o.  male without any prior history of medical problems-was recently admitted hospital for severe shortness of breath on exertion/fatigue.  Patient noted to have a hemoglobin of 3.8 in emergency room.  Patient was noted to be severely iron deficient.. Patient also underwent to receive PRBC transfusion.   Patient probably underwent EGD/colonoscopy-negative for an acute process/explanation for his anemia.  Patient continues to deny any blood in stools.  No blood in urine.  No nausea no vomiting.  No difficulty swallowing.  On p.o. iron.  Review of Systems  Constitutional: Positive for malaise/fatigue. Negative for chills, diaphoresis, fever and weight loss.  HENT: Negative for nosebleeds and sore throat.   Eyes: Negative for double vision.  Respiratory: Positive for shortness of breath. Negative for cough, hemoptysis, sputum production and wheezing.   Cardiovascular: Negative for chest pain, palpitations, orthopnea and leg swelling.  Gastrointestinal: Negative for abdominal pain, blood in stool, constipation, diarrhea, heartburn, melena, nausea and vomiting.  Genitourinary: Negative for dysuria, frequency and urgency.  Musculoskeletal: Negative for back pain and joint pain.  Skin: Negative.  Negative for itching and rash.  Neurological: Negative for dizziness, tingling, focal weakness, weakness and headaches.  Endo/Heme/Allergies: Does not bruise/bleed easily.  Psychiatric/Behavioral: Negative for  depression. The patient is not nervous/anxious and does not have insomnia.      MEDICAL HISTORY:  Past Medical History:  Diagnosis Date  . Alcohol use   . Tobacco abuse     SURGICAL HISTORY: Past Surgical History:  Procedure Laterality Date  . COLONOSCOPY N/A 03/01/2020   Procedure: COLONOSCOPY;  Surgeon: Toledo, Boykin Nearing, MD;  Location: [facility] ENDOSCOPY;  Service: Gastroenterology;  Laterality: N/A;  . ESOPHAGOGASTRODUODENOSCOPY N/A 03/01/2020   Procedure: ESOPHAGOGASTRODUODENOSCOPY (EGD);  Surgeon: Toledo, Boykin Nearing, MD;  Location: [facility] ENDOSCOPY;  Service: Gastroenterology;  Laterality: N/A;  . FOOT FRACTURE SURGERY Left     SOCIAL HISTORY: Social History   Socioeconomic History  . Marital status: Single    Spouse name: Not on file  . Number of children: Not on file  . Years of education: Not on file  . Highest education level: Not on file  Occupational History  . Not on file  Tobacco Use  . Smoking status: Current Every Day Smoker  . Smokeless tobacco: Never Used  Substance and Sexual Activity  . Alcohol use: Yes    Alcohol/week: 6.0 standard drinks    Types: 6 Cans of beer per week  . Drug use: Never  . Sexual activity: Not on file  Other Topics Concern  . 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    Social Determinants of Health   Financial Resource Strain:   . Difficulty of Paying Living Expenses:   Food Insecurity:   . Worried About Programme researcher, broadcasting/film/video in the Last Year:   . Barista in the Last Year:   Transportation Needs:   . Freight forwarder (Medical):   Marland Kitchen Lack of Transportation (Non-Medical):  Physical Activity:   . Days of Exercise per Week:   . Minutes of Exercise per Session:   Stress:   . Feeling of Stress :   Social Connections:   . Frequency of Communication with Friends and Family:   . Frequency of Social Gatherings with Friends and Family:    . Attends Religious Services:   . Active Member of Clubs or Organizations:   . Attends Banker Meetings:   Marland Kitchen Marital Status:   Intimate Partner Violence:   . Fear of Current or Ex-Partner:   . Emotionally Abused:   Marland Kitchen Physically Abused:   . Sexually Abused:     FAMILY HISTORY: Family History  Problem Relation Age of Onset  . Cancer Mother        his mother died of cancer, but patient is not sure which type of cancner.   . Diabetes Mellitus II Niece     ALLERGIES:  has No Known Allergies.  MEDICATIONS:  Current Outpatient Medications  Medication Sig Dispense Refill  . docusate sodium (STOOL SOFTENER) 100 MG capsule Take 100 mg by mouth 2 (two) times daily.    . Iron, Ferrous Sulfate, 325 (65 Fe) MG TABS Take 325 mg by mouth daily. 30 tablet 2  . acetaminophen (TYLENOL) 500 MG tablet Take 500-1,000 mg by mouth every 6 (six) hours as needed for mild pain, moderate pain or fever. (Patient not taking: Reported on 03/10/2020)     No current facility-administered medications for this visit.      Marland Kitchen  PHYSICAL EXAMINATION: ECOG PERFORMANCE STATUS: 1 - Symptomatic but completely ambulatory  Vitals:   03/10/20 1440  BP: (!) 177/65  Pulse: 83  Resp: 16  Temp: 98.8 F (37.1 C)  SpO2: 100%   Filed Weights   03/10/20 1440  Weight: 142 lb (64.4 kg)    Physical Exam Constitutional:      Comments: Alone; ambulating self.   HENT:     Head: Normocephalic and atraumatic.     Mouth/Throat:     Pharynx: No oropharyngeal exudate.  Eyes:     Pupils: Pupils are equal, round, and reactive to light.  Cardiovascular:     Rate and Rhythm: Normal rate and regular rhythm.  Pulmonary:     Effort: Pulmonary effort is normal. No respiratory distress.     Breath sounds: Normal breath sounds. No wheezing.  Abdominal:     General: Bowel sounds are normal. There is no distension.     Palpations: Abdomen is soft. There is no mass.     Tenderness: There is no abdominal  tenderness. There is no guarding or rebound.  Musculoskeletal:        General: No tenderness. Normal range of motion.     Cervical back: Normal range of motion and neck supple.  Skin:    General: Skin is warm.  Neurological:     Mental Status: He is alert and oriented to person, place, and time.  Psychiatric:        Mood and Affect: Affect normal.      LABORATORY DATA:  I have reviewed the data as listed Lab Results  Component Value Date   WBC 5.9 03/10/2020   HGB 9.7 (L) 03/10/2020   HCT 32.0 (L) 03/10/2020   MCV 80.2 03/10/2020   PLT 220 03/10/2020   Recent Labs    02/28/20 1008 02/29/20 0455  NA 139 139  K 3.8 3.9  CL 107 110  CO2 21* 21*  GLUCOSE 103*  98  BUN 12 8  CREATININE 1.03 0.86  CALCIUM 9.1 8.8*  GFRNONAA >60 >60  GFRAA >60 >60    RADIOGRAPHIC STUDIES: I have personally reviewed the radiological images as listed and agreed with the findings in the report. DG Chest 2 View  Result Date: 02/28/2020 CLINICAL DATA:  Dizziness, nausea, and SOB x a few weeks. Current smoker- 3-4 cigarettes per day. EXAM: CHEST - 2 VIEW COMPARISON:  None. FINDINGS: The heart size and mediastinal contours are within normal limits. Both lungs are clear. No pleural effusion or pneumothorax. The visualized skeletal structures are unremarkable. IMPRESSION: No active cardiopulmonary disease. Electronically Signed   By: Amie Portland M.D.   On: 02/28/2020 10:41    ASSESSMENT & PLAN:   Iron deficiency # Severe anemia hemoglobin 3.8 [july2021]-iron deficiency-s/p PRBC transfusion; recommend IV iron infusions.  Today hemoglobin is 9.7.  Discussed the potential acute infusion reactions with IV iron; which are quite rare.  Patient understands the risk; DECLINES infusions given the concerns.   # Constipation/black stool/dyspepsia- sec to PO iron; HOLD off PO iron.   #Etiology of iron deficiency-unclear EGD colonoscopy negative; awaiting capsule study.  Discussed with GI.  Given the  absence of any obvious explanation for his anemia and endoscopies I would recommend a CT scan abdomen pelvis with contrast. Pt reluctant [bills/lack of insurance].  Currently awaiting appointment with [facility]  # DISPOSITION: # HOLD VENOFER # follow up in 4 weeks MD- labs- cbc;possible/venofer-dr.B    All questions were answered. The patient knows to call the clinic with any problems, questions or concerns.       Earna Coder, MD 03/14/2020 4:41 PM

## 2020-03-17 ENCOUNTER — Ambulatory Visit: Payer: Self-pay

## 2020-03-22 ENCOUNTER — Encounter: Payer: Self-pay | Admitting: Pharmacy Technician

## 2020-03-22 NOTE — Progress Notes (Addendum)
Patient has been approved for drug assistance by [employer] for Venofer. The enrollment period is from 03/17/20-06/14/20 based on self pay. First DOS covered is TBA.

## 2020-03-24 ENCOUNTER — Ambulatory Visit: Payer: Self-pay

## 2020-03-31 ENCOUNTER — Ambulatory Visit: Payer: Self-pay

## 2020-04-07 ENCOUNTER — Ambulatory Visit: Payer: Self-pay

## 2020-04-09 ENCOUNTER — Other Ambulatory Visit: Payer: Self-pay

## 2020-04-09 ENCOUNTER — Inpatient Hospital Stay: Payer: Medicaid Other | Attending: Internal Medicine

## 2020-04-09 ENCOUNTER — Encounter: Payer: Self-pay | Admitting: Internal Medicine

## 2020-04-09 ENCOUNTER — Inpatient Hospital Stay: Payer: Medicaid Other

## 2020-04-09 ENCOUNTER — Inpatient Hospital Stay (HOSPITAL_BASED_OUTPATIENT_CLINIC_OR_DEPARTMENT_OTHER): Payer: Self-pay | Admitting: Internal Medicine

## 2020-04-09 DIAGNOSIS — R0602 Shortness of breath: Secondary | ICD-10-CM | POA: Insufficient documentation

## 2020-04-09 DIAGNOSIS — Z833 Family history of diabetes mellitus: Secondary | ICD-10-CM | POA: Insufficient documentation

## 2020-04-09 DIAGNOSIS — Z7289 Other problems related to lifestyle: Secondary | ICD-10-CM | POA: Insufficient documentation

## 2020-04-09 DIAGNOSIS — Z809 Family history of malignant neoplasm, unspecified: Secondary | ICD-10-CM | POA: Insufficient documentation

## 2020-04-09 DIAGNOSIS — Z79899 Other long term (current) drug therapy: Secondary | ICD-10-CM | POA: Insufficient documentation

## 2020-04-09 DIAGNOSIS — F17200 Nicotine dependence, unspecified, uncomplicated: Secondary | ICD-10-CM | POA: Insufficient documentation

## 2020-04-09 DIAGNOSIS — E611 Iron deficiency: Secondary | ICD-10-CM

## 2020-04-09 DIAGNOSIS — D509 Iron deficiency anemia, unspecified: Secondary | ICD-10-CM | POA: Insufficient documentation

## 2020-04-09 LAB — CBC WITH DIFFERENTIAL/PLATELET
Abs Immature Granulocytes: 0.03 10*3/uL (ref 0.00–0.07)
Basophils Absolute: 0.1 10*3/uL (ref 0.0–0.1)
Basophils Relative: 1 %
Eosinophils Absolute: 0.2 10*3/uL (ref 0.0–0.5)
Eosinophils Relative: 3 %
HCT: 33.1 % — ABNORMAL LOW (ref 39.0–52.0)
Hemoglobin: 10.4 g/dL — ABNORMAL LOW (ref 13.0–17.0)
Immature Granulocytes: 1 %
Lymphocytes Relative: 26 %
Lymphs Abs: 1.6 10*3/uL (ref 0.7–4.0)
MCH: 26.7 pg (ref 26.0–34.0)
MCHC: 31.4 g/dL (ref 30.0–36.0)
MCV: 85.1 fL (ref 80.0–100.0)
Monocytes Absolute: 1.1 10*3/uL — ABNORMAL HIGH (ref 0.1–1.0)
Monocytes Relative: 17 %
Neutro Abs: 3.3 10*3/uL (ref 1.7–7.7)
Neutrophils Relative %: 52 %
Platelets: 199 10*3/uL (ref 150–400)
RBC: 3.89 MIL/uL — ABNORMAL LOW (ref 4.22–5.81)
RDW: 25.6 % — ABNORMAL HIGH (ref 11.5–15.5)
Smear Review: ADEQUATE
WBC: 6.3 10*3/uL (ref 4.0–10.5)
nRBC: 0 % (ref 0.0–0.2)

## 2020-04-09 NOTE — Progress Notes (Signed)
[facility] CONSULT NOTE  Patient Care Team: Patient, No Pcp Per as PCP - General (General Practice) Earna Coder, MD as Consulting Physician (Hematology and Oncology)  CHIEF COMPLAINTS/PURPOSE OF CONSULTATION: Iron deficient anemia  #Iron deficient anemia- [July 2021-hemoglobin 3.8]; 2 units of PRBC transfusion; EGD colonoscopy [Dr. Toledo]-negative; capsule-lack of insurance/declines imaging  Oncology History   No history exists.     HISTORY OF PRESENTING ILLNESS:  Alan Everett 64 y.o.  male history of severe iron deficient anemia of unclear etiology is here for follow-up.  Patient declines IV iron given the concerns of cost.  Denies any blood in stools or black or stools.  No nausea no vomiting.  No difficulty swallowing.  Energy level/shortness of breath is improving not back to baseline.  Review of Systems  Constitutional: Positive for malaise/fatigue. Negative for chills, diaphoresis, fever and weight loss.  HENT: Negative for nosebleeds and sore throat.   Eyes: Negative for double vision.  Respiratory: Positive for shortness of breath. Negative for cough, hemoptysis, sputum production and wheezing.   Cardiovascular: Negative for chest pain, palpitations, orthopnea and leg swelling.  Gastrointestinal: Negative for abdominal pain, blood in stool, constipation, diarrhea, heartburn, melena, nausea and vomiting.  Genitourinary: Negative for dysuria, frequency and urgency.  Musculoskeletal: Negative for back pain and joint pain.  Skin: Negative.  Negative for itching and rash.  Neurological: Negative for dizziness, tingling, focal weakness, weakness and headaches.  Endo/Heme/Allergies: Does not bruise/bleed easily.  Psychiatric/Behavioral: Negative for depression. The patient is not nervous/anxious and does not have insomnia.      MEDICAL HISTORY:  Past Medical History:  Diagnosis Date  . Alcohol use   . Tobacco abuse     SURGICAL HISTORY: Past  Surgical History:  Procedure Laterality Date  . COLONOSCOPY N/A 03/01/2020   Procedure: COLONOSCOPY;  Surgeon: Toledo, Boykin Nearing, MD;  Location: [facility] ENDOSCOPY;  Service: Gastroenterology;  Laterality: N/A;  . ESOPHAGOGASTRODUODENOSCOPY N/A 03/01/2020   Procedure: ESOPHAGOGASTRODUODENOSCOPY (EGD);  Surgeon: Toledo, Boykin Nearing, MD;  Location: [facility] ENDOSCOPY;  Service: Gastroenterology;  Laterality: N/A;  . FOOT FRACTURE SURGERY Left     SOCIAL HISTORY: Social History   Socioeconomic History  . Marital status: Single    Spouse name: Not on file  . Number of children: Not on file  . Years of education: Not on file  . Highest education level: Not on file  Occupational History  . Not on file  Tobacco Use  . Smoking status: Current Every Day Smoker  . Smokeless tobacco: Never Used  Substance and Sexual Activity  . Alcohol use: Yes    Alcohol/week: 6.0 standard drinks    Types: 6 Cans of beer per week  . Drug use: Never  . Sexual activity: Not on file  Other Topics Concern  . 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    Social Determinants of Health   Financial Resource Strain:   . Difficulty of Paying Living Expenses: Not on file  Food Insecurity:   . Worried About Programme researcher, broadcasting/film/video in the Last Year: Not on file  . Ran Out of Food in the Last Year: Not on file  Transportation Needs:   . Lack of Transportation (Medical): Not on file  . Lack of Transportation (Non-Medical): Not on file  Physical Activity:   . Days of Exercise per Week: Not on file  . Minutes of Exercise  per Session: Not on file  Stress:   . Feeling of Stress : Not on file  Social Connections:   . Frequency of Communication with Friends and Family: Not on file  . Frequency of Social Gatherings with Friends and Family: Not on file  . Attends Religious Services: Not on file  . Active Member of Clubs or Organizations: Not on  file  . Attends Banker Meetings: Not on file  . Marital Status: Not on file  Intimate Partner Violence:   . Fear of Current or Ex-Partner: Not on file  . Emotionally Abused: Not on file  . Physically Abused: Not on file  . Sexually Abused: Not on file    FAMILY HISTORY: Family History  Problem Relation Age of Onset  . Cancer Mother        his mother died of cancer, but patient is not sure which type of cancner.   . Diabetes Mellitus II Niece     ALLERGIES:  has No Known Allergies.  MEDICATIONS:  Current Outpatient Medications  Medication Sig Dispense Refill  . docusate sodium (STOOL SOFTENER) 100 MG capsule Take 100 mg by mouth 2 (two) times daily.    . Iron, Ferrous Sulfate, 325 (65 Fe) MG TABS Take 325 mg by mouth daily. 30 tablet 2  . acetaminophen (TYLENOL) 500 MG tablet Take 500-1,000 mg by mouth every 6 (six) hours as needed for mild pain, moderate pain or fever. (Patient not taking: Reported on 03/10/2020)     No current facility-administered medications for this visit.      Marland Kitchen  PHYSICAL EXAMINATION: ECOG PERFORMANCE STATUS: 1 - Symptomatic but completely ambulatory  Vitals:   04/09/20 1403  BP: (!) 148/66  Pulse: 72  Resp: 16  Temp: 98.2 F (36.8 C)  SpO2: 100%   Filed Weights   04/09/20 1403  Weight: 142 lb 12.8 oz (64.8 kg)    Physical Exam Constitutional:      Comments: Alone; ambulating self.   HENT:     Head: Normocephalic and atraumatic.     Mouth/Throat:     Pharynx: No oropharyngeal exudate.  Eyes:     Pupils: Pupils are equal, round, and reactive to light.  Cardiovascular:     Rate and Rhythm: Normal rate and regular rhythm.  Pulmonary:     Effort: Pulmonary effort is normal. No respiratory distress.     Breath sounds: Normal breath sounds. No wheezing.  Abdominal:     General: Bowel sounds are normal. There is no distension.     Palpations: Abdomen is soft. There is no mass.     Tenderness: There is no abdominal  tenderness. There is no guarding or rebound.  Musculoskeletal:        General: No tenderness. Normal range of motion.     Cervical back: Normal range of motion and neck supple.  Skin:    General: Skin is warm.  Neurological:     Mental Status: He is alert and oriented to person, place, and time.  Psychiatric:        Mood and Affect: Affect normal.      LABORATORY DATA:  I have reviewed the data as listed Lab Results  Component Value Date   WBC 6.3 04/09/2020   HGB 10.4 (L) 04/09/2020   HCT 33.1 (L) 04/09/2020   MCV 85.1 04/09/2020   PLT 199 04/09/2020   Recent Labs    02/28/20 1008 02/29/20 0455  NA 139 139  K 3.8 3.9  CL 107 110  CO2 21* 21*  GLUCOSE 103* 98  BUN 12 8  CREATININE 1.03 0.86  CALCIUM 9.1 8.8*  GFRNONAA >60 >60  GFRAA >60 >60    RADIOGRAPHIC STUDIES: I have personally reviewed the radiological images as listed and agreed with the findings in the report. No results found.  ASSESSMENT & PLAN:   Iron deficiency # Severe anemia hemoglobin 3.8 [july2021]-iron deficiency-s/p PRBC transfusion; declines IV iron infusions [Venofer is approved]. On PO iron- Today hemoglobin is 10.4.  Continue p.o. iron.  #Etiology of iron deficiency-unclear EGD colonoscopy negative; on capsule study on HOLD sec to insuarnce.  Declined CT scan abdomen pelvis.  # DISPOSITION: # HOLD VENOFER # follow up in 2 month MD- labs- cbc;iron studies/ferritin-possible/venofer-dr.B    All questions were answered. The patient knows to call the clinic with any problems, questions or concerns.       Earna Coder, MD 04/13/2020 12:41 PM

## 2020-04-09 NOTE — Assessment & Plan Note (Addendum)
#   Severe anemia hemoglobin 3.8 [july2021]-iron deficiency-s/p PRBC transfusion; declines IV iron infusions [Venofer is approved]. On PO iron- Today hemoglobin is 10.4.  Continue p.o. iron.  #Etiology of iron deficiency-unclear EGD colonoscopy negative; on capsule study on HOLD sec to insuarnce.  Declined CT scan abdomen pelvis.  # DISPOSITION: # HOLD VENOFER # follow up in 2 month MD- labs- cbc;iron studies/ferritin-possible/venofer-dr.B

## 2020-04-21 ENCOUNTER — Ambulatory Visit: Payer: Self-pay

## 2020-04-21 ENCOUNTER — Other Ambulatory Visit: Payer: Self-pay

## 2020-04-21 ENCOUNTER — Ambulatory Visit: Payer: Self-pay | Admitting: Internal Medicine

## 2020-05-05 ENCOUNTER — Inpatient Hospital Stay (HOSPITAL_COMMUNITY): Payer: Medicaid Other

## 2020-05-05 ENCOUNTER — Inpatient Hospital Stay (HOSPITAL_COMMUNITY)
Admission: EM | Admit: 2020-05-05 | Discharge: 2020-07-16 | DRG: 064 | Disposition: A | Discharge status: Skilled Nursing Facility | Payer: Medicaid Other | Attending: Internal Medicine | Admitting: Internal Medicine

## 2020-05-05 ENCOUNTER — Emergency Department (HOSPITAL_COMMUNITY): Payer: Medicaid Other

## 2020-05-05 DIAGNOSIS — R195 Other fecal abnormalities: Secondary | ICD-10-CM

## 2020-05-05 DIAGNOSIS — Z681 Body mass index (BMI) 19 or less, adult: Secondary | ICD-10-CM

## 2020-05-05 DIAGNOSIS — F1910 Other psychoactive substance abuse, uncomplicated: Secondary | ICD-10-CM

## 2020-05-05 DIAGNOSIS — Z931 Gastrostomy status: Secondary | ICD-10-CM

## 2020-05-05 DIAGNOSIS — A419 Sepsis, unspecified organism: Secondary | ICD-10-CM | POA: Diagnosis not present

## 2020-05-05 DIAGNOSIS — Z23 Encounter for immunization: Secondary | ICD-10-CM | POA: Diagnosis not present

## 2020-05-05 DIAGNOSIS — R4702 Dysphasia: Secondary | ICD-10-CM | POA: Diagnosis present

## 2020-05-05 DIAGNOSIS — R270 Ataxia, unspecified: Secondary | ICD-10-CM | POA: Diagnosis present

## 2020-05-05 DIAGNOSIS — Z7289 Other problems related to lifestyle: Secondary | ICD-10-CM

## 2020-05-05 DIAGNOSIS — D539 Nutritional anemia, unspecified: Secondary | ICD-10-CM | POA: Diagnosis present

## 2020-05-05 DIAGNOSIS — D638 Anemia in other chronic diseases classified elsewhere: Secondary | ICD-10-CM | POA: Diagnosis present

## 2020-05-05 DIAGNOSIS — G936 Cerebral edema: Secondary | ICD-10-CM | POA: Diagnosis present

## 2020-05-05 DIAGNOSIS — E878 Other disorders of electrolyte and fluid balance, not elsewhere classified: Secondary | ICD-10-CM | POA: Diagnosis present

## 2020-05-05 DIAGNOSIS — R29719 NIHSS score 19: Secondary | ICD-10-CM | POA: Diagnosis not present

## 2020-05-05 DIAGNOSIS — E43 Unspecified severe protein-calorie malnutrition: Secondary | ICD-10-CM | POA: Diagnosis present

## 2020-05-05 DIAGNOSIS — R29705 NIHSS score 5: Secondary | ICD-10-CM | POA: Diagnosis present

## 2020-05-05 DIAGNOSIS — I509 Heart failure, unspecified: Secondary | ICD-10-CM | POA: Diagnosis present

## 2020-05-05 DIAGNOSIS — I618 Other nontraumatic intracerebral hemorrhage: Principal | ICD-10-CM | POA: Diagnosis present

## 2020-05-05 DIAGNOSIS — E1165 Type 2 diabetes mellitus with hyperglycemia: Secondary | ICD-10-CM | POA: Diagnosis not present

## 2020-05-05 DIAGNOSIS — G8191 Hemiplegia, unspecified affecting right dominant side: Secondary | ICD-10-CM | POA: Diagnosis present

## 2020-05-05 DIAGNOSIS — R414 Neurologic neglect syndrome: Secondary | ICD-10-CM | POA: Diagnosis present

## 2020-05-05 DIAGNOSIS — R54 Age-related physical debility: Secondary | ICD-10-CM | POA: Diagnosis present

## 2020-05-05 DIAGNOSIS — Z20822 Contact with and (suspected) exposure to covid-19: Secondary | ICD-10-CM | POA: Diagnosis present

## 2020-05-05 DIAGNOSIS — R6510 Systemic inflammatory response syndrome (SIRS) of non-infectious origin without acute organ dysfunction: Secondary | ICD-10-CM

## 2020-05-05 DIAGNOSIS — D696 Thrombocytopenia, unspecified: Secondary | ICD-10-CM | POA: Diagnosis present

## 2020-05-05 DIAGNOSIS — G919 Hydrocephalus, unspecified: Secondary | ICD-10-CM | POA: Diagnosis present

## 2020-05-05 DIAGNOSIS — R739 Hyperglycemia, unspecified: Secondary | ICD-10-CM

## 2020-05-05 DIAGNOSIS — K769 Liver disease, unspecified: Secondary | ICD-10-CM | POA: Diagnosis present

## 2020-05-05 DIAGNOSIS — D509 Iron deficiency anemia, unspecified: Secondary | ICD-10-CM | POA: Diagnosis present

## 2020-05-05 DIAGNOSIS — R4701 Aphasia: Secondary | ICD-10-CM | POA: Diagnosis present

## 2020-05-05 DIAGNOSIS — F1410 Cocaine abuse, uncomplicated: Secondary | ICD-10-CM | POA: Diagnosis present

## 2020-05-05 DIAGNOSIS — G9341 Metabolic encephalopathy: Secondary | ICD-10-CM | POA: Diagnosis present

## 2020-05-05 DIAGNOSIS — R0602 Shortness of breath: Secondary | ICD-10-CM

## 2020-05-05 DIAGNOSIS — I110 Hypertensive heart disease with heart failure: Secondary | ICD-10-CM | POA: Diagnosis present

## 2020-05-05 DIAGNOSIS — F17210 Nicotine dependence, cigarettes, uncomplicated: Secondary | ICD-10-CM | POA: Diagnosis present

## 2020-05-05 DIAGNOSIS — Z79899 Other long term (current) drug therapy: Secondary | ICD-10-CM

## 2020-05-05 DIAGNOSIS — G8250 Quadriplegia, unspecified: Secondary | ICD-10-CM | POA: Diagnosis not present

## 2020-05-05 DIAGNOSIS — D72829 Elevated white blood cell count, unspecified: Secondary | ICD-10-CM

## 2020-05-05 DIAGNOSIS — L8990 Pressure ulcer of unspecified site, unspecified stage: Secondary | ICD-10-CM | POA: Insufficient documentation

## 2020-05-05 DIAGNOSIS — N390 Urinary tract infection, site not specified: Secondary | ICD-10-CM

## 2020-05-05 DIAGNOSIS — K921 Melena: Secondary | ICD-10-CM | POA: Diagnosis not present

## 2020-05-05 DIAGNOSIS — I161 Hypertensive emergency: Secondary | ICD-10-CM | POA: Diagnosis present

## 2020-05-05 DIAGNOSIS — E870 Hyperosmolality and hypernatremia: Secondary | ICD-10-CM | POA: Diagnosis present

## 2020-05-05 DIAGNOSIS — R1319 Other dysphagia: Secondary | ICD-10-CM | POA: Diagnosis present

## 2020-05-05 DIAGNOSIS — R1310 Dysphagia, unspecified: Secondary | ICD-10-CM

## 2020-05-05 DIAGNOSIS — K760 Fatty (change of) liver, not elsewhere classified: Secondary | ICD-10-CM | POA: Diagnosis present

## 2020-05-05 DIAGNOSIS — B1920 Unspecified viral hepatitis C without hepatic coma: Secondary | ICD-10-CM | POA: Diagnosis present

## 2020-05-05 DIAGNOSIS — R7401 Elevation of levels of liver transaminase levels: Secondary | ICD-10-CM

## 2020-05-05 DIAGNOSIS — D72823 Leukemoid reaction: Secondary | ICD-10-CM | POA: Diagnosis not present

## 2020-05-05 DIAGNOSIS — K219 Gastro-esophageal reflux disease without esophagitis: Secondary | ICD-10-CM | POA: Diagnosis present

## 2020-05-05 DIAGNOSIS — J690 Pneumonitis due to inhalation of food and vomit: Secondary | ICD-10-CM

## 2020-05-05 DIAGNOSIS — R140 Abdominal distension (gaseous): Secondary | ICD-10-CM

## 2020-05-05 DIAGNOSIS — D62 Acute posthemorrhagic anemia: Secondary | ICD-10-CM | POA: Diagnosis not present

## 2020-05-05 DIAGNOSIS — R0600 Dyspnea, unspecified: Secondary | ICD-10-CM

## 2020-05-05 DIAGNOSIS — E785 Hyperlipidemia, unspecified: Secondary | ICD-10-CM | POA: Diagnosis present

## 2020-05-05 DIAGNOSIS — B961 Klebsiella pneumoniae [K. pneumoniae] as the cause of diseases classified elsewhere: Secondary | ICD-10-CM | POA: Diagnosis present

## 2020-05-05 DIAGNOSIS — I69320 Aphasia following cerebral infarction: Secondary | ICD-10-CM

## 2020-05-05 DIAGNOSIS — Z515 Encounter for palliative care: Secondary | ICD-10-CM

## 2020-05-05 DIAGNOSIS — I615 Nontraumatic intracerebral hemorrhage, intraventricular: Secondary | ICD-10-CM | POA: Diagnosis present

## 2020-05-05 DIAGNOSIS — E871 Hypo-osmolality and hyponatremia: Secondary | ICD-10-CM | POA: Diagnosis not present

## 2020-05-05 DIAGNOSIS — I951 Orthostatic hypotension: Secondary | ICD-10-CM | POA: Diagnosis not present

## 2020-05-05 DIAGNOSIS — L89312 Pressure ulcer of right buttock, stage 2: Secondary | ICD-10-CM | POA: Diagnosis not present

## 2020-05-05 DIAGNOSIS — Z7151 Drug abuse counseling and surveillance of drug abuser: Secondary | ICD-10-CM

## 2020-05-05 DIAGNOSIS — R509 Fever, unspecified: Secondary | ICD-10-CM

## 2020-05-05 DIAGNOSIS — Z7189 Other specified counseling: Secondary | ICD-10-CM

## 2020-05-05 DIAGNOSIS — B182 Chronic viral hepatitis C: Secondary | ICD-10-CM

## 2020-05-05 DIAGNOSIS — R471 Dysarthria and anarthria: Secondary | ICD-10-CM | POA: Diagnosis present

## 2020-05-05 DIAGNOSIS — E876 Hypokalemia: Secondary | ICD-10-CM

## 2020-05-05 DIAGNOSIS — W07XXXA Fall from chair, initial encounter: Secondary | ICD-10-CM | POA: Diagnosis present

## 2020-05-05 DIAGNOSIS — I69391 Dysphagia following cerebral infarction: Secondary | ICD-10-CM

## 2020-05-05 DIAGNOSIS — I619 Nontraumatic intracerebral hemorrhage, unspecified: Secondary | ICD-10-CM | POA: Diagnosis present

## 2020-05-05 DIAGNOSIS — M6289 Other specified disorders of muscle: Secondary | ICD-10-CM

## 2020-05-05 DIAGNOSIS — R0902 Hypoxemia: Secondary | ICD-10-CM

## 2020-05-05 DIAGNOSIS — R29810 Facial weakness: Secondary | ICD-10-CM | POA: Diagnosis present

## 2020-05-05 DIAGNOSIS — Z716 Tobacco abuse counseling: Secondary | ICD-10-CM

## 2020-05-05 LAB — LIPID PANEL
Cholesterol: 146 mg/dL (ref 0–200)
HDL: 49 mg/dL (ref >40)
LDL Cholesterol: 80 mg/dL (ref 0–99)
Total CHOL/HDL Ratio: 3 RATIO
Triglycerides: 87 mg/dL (ref <150)
VLDL: 17 mg/dL (ref 0–40)

## 2020-05-05 LAB — I-STAT CHEM 8, ED
BUN: 11 mg/dL (ref 8–23)
Calcium, Ion: 1.06 mmol/L — ABNORMAL LOW (ref 1.15–1.40)
Chloride: 108 mmol/L (ref 98–111)
Creatinine, Ser: 0.8 mg/dL (ref 0.61–1.24)
Glucose, Bld: 99 mg/dL (ref 70–99)
HCT: 43 % (ref 39.0–52.0)
Hemoglobin: 14.6 g/dL (ref 13.0–17.0)
Potassium: 5.2 mmol/L — ABNORMAL HIGH (ref 3.5–5.1)
Sodium: 139 mmol/L (ref 135–145)
TCO2: 24 mmol/L (ref 22–32)

## 2020-05-05 LAB — CBC WITH DIFFERENTIAL/PLATELET
Abs Immature Granulocytes: 0.07 10*3/uL (ref 0.00–0.07)
Basophils Absolute: 0.1 10*3/uL (ref 0.0–0.1)
Basophils Relative: 1 %
Eosinophils Absolute: 0.1 10*3/uL (ref 0.0–0.5)
Eosinophils Relative: 1 %
HCT: 40.8 % (ref 39.0–52.0)
Hemoglobin: 12.3 g/dL — ABNORMAL LOW (ref 13.0–17.0)
Immature Granulocytes: 1 %
Lymphocytes Relative: 11 %
Lymphs Abs: 0.9 10*3/uL (ref 0.7–4.0)
MCH: 28 pg (ref 26.0–34.0)
MCHC: 30.1 g/dL (ref 30.0–36.0)
MCV: 92.7 fL (ref 80.0–100.0)
Monocytes Absolute: 1 10*3/uL (ref 0.1–1.0)
Monocytes Relative: 11 %
Neutro Abs: 6.6 10*3/uL (ref 1.7–7.7)
Neutrophils Relative %: 75 %
Platelets: 149 10*3/uL — ABNORMAL LOW (ref 150–400)
RBC: 4.4 MIL/uL (ref 4.22–5.81)
RDW: 20.8 % — ABNORMAL HIGH (ref 11.5–15.5)
WBC: 8.8 10*3/uL (ref 4.0–10.5)
nRBC: 0 % (ref 0.0–0.2)

## 2020-05-05 LAB — BPAM PLATELET PHERESIS
Blood Product Expiration Date: 202110082359
ISSUE DATE / TIME: 202110062032
Unit Type and Rh: 6200

## 2020-05-05 LAB — RESPIRATORY PANEL BY RT PCR (FLU A&B, COVID)
Influenza A by PCR: NEGATIVE
Influenza B by PCR: NEGATIVE
SARS Coronavirus 2 by RT PCR: NEGATIVE

## 2020-05-05 LAB — COMPREHENSIVE METABOLIC PANEL
ALT: 64 U/L — ABNORMAL HIGH (ref 0–44)
AST: 68 U/L — ABNORMAL HIGH (ref 15–41)
Albumin: 3.9 g/dL (ref 3.5–5.0)
Alkaline Phosphatase: 90 U/L (ref 38–126)
Anion gap: 11 (ref 5–15)
BUN: 9 mg/dL (ref 8–23)
CO2: 20 mmol/L — ABNORMAL LOW (ref 22–32)
Calcium: 9.5 mg/dL (ref 8.9–10.3)
Chloride: 106 mmol/L (ref 98–111)
Creatinine, Ser: 0.97 mg/dL (ref 0.61–1.24)
GFR calc non Af Amer: >60 mL/min (ref >60)
Glucose, Bld: 101 mg/dL — ABNORMAL HIGH (ref 70–99)
Potassium: 3.9 mmol/L (ref 3.5–5.1)
Sodium: 137 mmol/L (ref 135–145)
Total Bilirubin: 0.8 mg/dL (ref 0.3–1.2)
Total Protein: 8.6 g/dL — ABNORMAL HIGH (ref 6.5–8.1)

## 2020-05-05 LAB — DIFFERENTIAL
Abs Immature Granulocytes: 0.03 10*3/uL (ref 0.00–0.07)
Basophils Absolute: 0 10*3/uL (ref 0.0–0.1)
Basophils Relative: 1 %
Eosinophils Absolute: 0 10*3/uL (ref 0.0–0.5)
Eosinophils Relative: 1 %
Immature Granulocytes: 1 %
Lymphocytes Relative: 28 %
Lymphs Abs: 0.8 10*3/uL (ref 0.7–4.0)
Monocytes Absolute: 0.4 10*3/uL (ref 0.1–1.0)
Monocytes Relative: 14 %
Neutro Abs: 1.6 10*3/uL — ABNORMAL LOW (ref 1.7–7.7)
Neutrophils Relative %: 55 %

## 2020-05-05 LAB — PREPARE PLATELET PHERESIS: Unit division: 0

## 2020-05-05 LAB — PROTIME-INR
INR: 1.2 (ref 0.8–1.2)
Prothrombin Time: 14.4 seconds (ref 11.4–15.2)

## 2020-05-05 LAB — HEMOGLOBIN A1C
Hgb A1c MFr Bld: 4.6 % — ABNORMAL LOW (ref 4.8–5.6)
Mean Plasma Glucose: 85.32 mg/dL

## 2020-05-05 LAB — TYPE AND SCREEN
ABO/RH(D): O POS
Antibody Screen: NEGATIVE

## 2020-05-05 LAB — APTT: aPTT: 32 seconds (ref 24–36)

## 2020-05-05 LAB — ETHANOL: Alcohol, Ethyl (B): <10 mg/dL (ref <10)

## 2020-05-05 LAB — CBG MONITORING, ED: Glucose-Capillary: 102 mg/dL — ABNORMAL HIGH (ref 70–99)

## 2020-05-05 MED ORDER — CHLORHEXIDINE GLUCONATE CLOTH 2 % EX PADS
6.0000 | MEDICATED_PAD | Freq: Every day | CUTANEOUS | Status: DC
  Administered 2020-05-06 – 2020-05-14 (×8): 6 via TOPICAL

## 2020-05-05 MED ORDER — CLEVIDIPINE BUTYRATE 0.5 MG/ML IV EMUL: 0.0000 mg/h | INTRAVENOUS | Status: DC

## 2020-05-05 MED ORDER — SODIUM CHLORIDE 0.9 % IV SOLN: 10.0000 mL/h | Freq: Once | INTRAVENOUS | Status: DC

## 2020-05-05 MED ORDER — ACETAMINOPHEN 160 MG/5ML PO SOLN
650.0000 mg | ORAL | Status: DC | PRN
  Administered 2020-05-07 – 2020-05-28 (×19): 650 mg
  Filled 2020-05-05 (×19): qty 20.3

## 2020-05-05 MED ORDER — SENNOSIDES-DOCUSATE SODIUM 8.6-50 MG PO TABS: 1.0000 | ORAL_TABLET | Freq: Two times a day (BID) | ORAL | Status: DC

## 2020-05-05 MED ORDER — FERROUS SULFATE 325 (65 FE) MG PO TABS
325.0000 mg | ORAL_TABLET | Freq: Every day | ORAL | Status: DC
  Filled 2020-05-05 (×2): qty 1

## 2020-05-05 MED ORDER — CLEVIDIPINE BUTYRATE 0.5 MG/ML IV EMUL
INTRAVENOUS | Status: AC
  Administered 2020-05-05: 1 mg/h via INTRAVENOUS
  Filled 2020-05-05: qty 50

## 2020-05-05 MED ORDER — ACETAMINOPHEN 325 MG PO TABS
650.0000 mg | ORAL_TABLET | ORAL | Status: DC | PRN
  Administered 2020-05-12: 650 mg via ORAL
  Filled 2020-05-05: qty 2

## 2020-05-05 MED ORDER — PANTOPRAZOLE SODIUM 40 MG IV SOLR
40.0000 mg | Freq: Every day | INTRAVENOUS | Status: DC
  Administered 2020-05-06 (×2): 40 mg via INTRAVENOUS
  Filled 2020-05-05 (×3): qty 40

## 2020-05-05 MED ORDER — STROKE: EARLY STAGES OF RECOVERY BOOK
Freq: Once | Status: AC
  Filled 2020-05-05 (×2): qty 1

## 2020-05-05 MED ORDER — ONDANSETRON HCL 4 MG/2ML IJ SOLN
4.0000 mg | Freq: Once | INTRAMUSCULAR | Status: AC
  Administered 2020-05-05: 4 mg via INTRAVENOUS
  Filled 2020-05-05: qty 2

## 2020-05-05 MED ORDER — ACETAMINOPHEN 650 MG RE SUPP: 650.0000 mg | RECTAL | Status: DC | PRN

## 2020-05-05 MED ORDER — CLEVIDIPINE BUTYRATE 0.5 MG/ML IV EMUL
0.0000 mg/h | INTRAVENOUS | Status: DC
  Administered 2020-05-05 – 2020-05-06 (×4): 21 mg/h via INTRAVENOUS
  Administered 2020-05-06 (×2): 19 mg/h via INTRAVENOUS
  Filled 2020-05-05 (×6): qty 50

## 2020-05-05 NOTE — ED Provider Notes (Signed)
[facility] EMERGENCY DEPARTMENT Provider Note   CSN: [id number] Arrival date & time: 05/05/20  1817     History Chief Complaint  Patient presents with  . Code Stroke    Alan Everett is a 64 y.o. male.  Earlier today.  Sitting in a chair and tried to get up he noticed he was having problems using his right arm and right leg.  Denies any current headache.  No double vision or blurry vision.  No difficulty with speech he was an emergent stroke activation by EMS and went to a CT scan which showed an intraparenchymal bleed.  Currently denies any headache.  No prior history of stroke.  Denies history of hypertension but is hypertensive here.LKW is 1600.  The history is provided by the patient and the EMS personnel.  Cerebrovascular Accident This is a new problem. Episode onset: 2.5 hours. The problem occurs constantly. The problem has not changed since onset.Associated symptoms include headaches. Pertinent negatives include no chest pain, no abdominal pain and no shortness of breath. Nothing aggravates the symptoms. Nothing relieves the symptoms. He has tried nothing for the symptoms. The treatment provided no relief.       Past Medical History:  Diagnosis Date  . Alcohol use   . Tobacco abuse     Patient Active Problem List   Diagnosis Date Noted  . Iron deficiency 02/29/2020  . Symptomatic anemia 02/28/2020  . Microcytic anemia 02/28/2020  . Alcohol use 02/28/2020  . Tobacco abuse     Past Surgical History:  Procedure Laterality Date  . COLONOSCOPY N/A 03/01/2020   Procedure: COLONOSCOPY;  Surgeon: Toledo, Boykin Nearing, MD;  Location: [facility] ENDOSCOPY;  Service: Gastroenterology;  Laterality: N/A;  . ESOPHAGOGASTRODUODENOSCOPY N/A 03/01/2020   Procedure: ESOPHAGOGASTRODUODENOSCOPY (EGD);  Surgeon: Toledo, Boykin Nearing, MD;  Location: [facility] ENDOSCOPY;  Service: Gastroenterology;  Laterality: N/A;  . FOOT FRACTURE SURGERY Left        Family History  Problem Relation  Age of Onset  . Cancer Mother        his mother died of cancer, but patient is not sure which type of cancner.   . Diabetes Mellitus II Niece     Social History   Tobacco Use  . Smoking status: Current Every Day Smoker  . Smokeless tobacco: Never Used  Substance Use Topics  . Alcohol use: Yes    Alcohol/week: 6.0 standard drinks    Types: 6 Cans of beer per week  . Drug use: Never    Home Medications Prior to Admission medications   Medication Sig Start Date End Date Taking? Authorizing Provider  acetaminophen (TYLENOL) 500 MG tablet Take 500-1,000 mg by mouth every 6 (six) hours as needed for mild pain, moderate pain or fever. Patient not taking: Reported on 03/10/2020    [provider]  docusate sodium (STOOL SOFTENER) 100 MG capsule Take 100 mg by mouth 2 (two) times daily.    [provider]  Iron, Ferrous Sulfate, 325 (65 Fe) MG TABS Take 325 mg by mouth daily. 03/01/20   Tresa Moore, MD    Allergies    Patient has no known allergies.  Review of Systems   Review of Systems  Constitutional: Negative for fever.  HENT: Negative for sore throat.   Eyes: Negative for visual disturbance.  Respiratory: Negative for shortness of breath.   Cardiovascular: Negative for chest pain.  Gastrointestinal: Negative for abdominal pain.  Genitourinary: Negative for dysuria.  Musculoskeletal: Negative for neck pain.  Skin: Negative for rash.  Neurological: Positive for weakness, numbness and headaches. Negative for speech difficulty.    Physical Exam Updated Vital Signs BP 128/70   Pulse 76   Temp 99 F (37.2 C) (Axillary)   Resp 16   Wt 65.5 kg   SpO2 100%   BMI 23.31 kg/m   Physical Exam Vitals and nursing note reviewed.  Constitutional:      Appearance: Normal appearance. He is well-developed.  HENT:     Head: Normocephalic and atraumatic.  Eyes:     Conjunctiva/sclera: Conjunctivae normal.  Cardiovascular:     Rate and Rhythm: Normal rate  and regular rhythm.     Heart sounds: No murmur heard.   Pulmonary:     Effort: Pulmonary effort is normal. No respiratory distress.     Breath sounds: Normal breath sounds.  Abdominal:     Palpations: Abdomen is soft.     Tenderness: There is no abdominal tenderness.  Musculoskeletal:        General: No deformity or signs of injury. Normal range of motion.     Cervical back: Neck supple.  Skin:    General: Skin is warm and dry.     Capillary Refill: Capillary refill takes less than 2 seconds.  Neurological:     Mental Status: He is alert.     Cranial Nerves: No cranial nerve deficit.     Sensory: Sensory deficit present.     Motor: Weakness present.     Comments: Patient is awake and alert and able to answer questions.  He has some subtle right facial droop.  Extraocular movements intact.  He has right arm and right leg weakness 4 out of 5.  Decreased sensation right arm right leg.     ED Results / Procedures / Treatments   Labs (all labs ordered are listed, but only abnormal results are displayed) Labs Reviewed  CBC - Abnormal; Notable for the following components:      Result Value   WBC 3.0 (*)    Hemoglobin 12.4 (*)    RDW 20.4 (*)    Platelets 6 (*)    All other components within normal limits  DIFFERENTIAL - Abnormal; Notable for the following components:   Neutro Abs 1.6 (*)    All other components within normal limits  COMPREHENSIVE METABOLIC PANEL - Abnormal; Notable for the following components:   CO2 20 (*)    Glucose, Bld 101 (*)    Total Protein 8.6 (*)    AST 68 (*)    ALT 64 (*)    All other components within normal limits  HEMOGLOBIN A1C - Abnormal; Notable for the following components:   Hgb A1c MFr Bld 4.6 (*)    All other components within normal limits  CBC WITH DIFFERENTIAL/PLATELET - Abnormal; Notable for the following components:   Hemoglobin 12.3 (*)    RDW 20.8 (*)    Platelets 149 (*)    All other components within normal limits   URINALYSIS, ROUTINE W REFLEX MICROSCOPIC - Abnormal; Notable for the following components:   APPearance HAZY (*)    Hgb urine dipstick SMALL (*)    Ketones, ur 5 (*)    Bacteria, UA RARE (*)    All other components within normal limits  RAPID URINE DRUG SCREEN, HOSP PERFORMED - Abnormal; Notable for the following components:   Cocaine POSITIVE (*)    All other components within normal limits  CBC WITH DIFFERENTIAL/PLATELET - Abnormal; Notable for the  following components:   WBC 15.9 (*)    RBC 3.95 (*)    Hemoglobin 11.6 (*)    HCT 36.2 (*)    RDW 21.2 (*)    Neutro Abs 13.7 (*)    Abs Immature Granulocytes 0.20 (*)    All other components within normal limits  COMPREHENSIVE METABOLIC PANEL - Abnormal; Notable for the following components:   CO2 20 (*)    Glucose, Bld 151 (*)    AST 52 (*)    ALT 47 (*)    All other components within normal limits  CBG MONITORING, ED - Abnormal; Notable for the following components:   Glucose-Capillary 102 (*)    All other components within normal limits  I-STAT CHEM 8, ED - Abnormal; Notable for the following components:   Potassium 5.2 (*)    Calcium, Ion 1.06 (*)    All other components within normal limits  RESPIRATORY PANEL BY RT PCR (FLU A&B, COVID)  MRSA PCR SCREENING  ETHANOL  PROTIME-INR  APTT  LIPID PANEL  TYPE AND SCREEN  PREPARE PLATELET PHERESIS    EKG EKG Interpretation  Date/Time:  Wednesday May 05 2020 18:55:41 EDT Ventricular Rate:  82 PR Interval:    QRS Duration: 106 QT Interval:  378 QTC Calculation: 442 R Axis:   62 Text Interpretation: Sinus rhythm No significant change since prior 7/21 Confirmed by Meridee Score [phone]) on 05/05/2020 6:57:28 PM   Radiology CT HEAD WO CONTRAST  Result Date: 05/06/2020 CLINICAL DATA:  Follow-up parenchymal hemorrhage EXAM: CT HEAD WITHOUT CONTRAST TECHNIQUE: Contiguous axial images were obtained from the base of the skull through the vertex without intravenous  contrast. COMPARISON:  Yesterday FINDINGS: Brain: Left thalamic hematoma with extensive intraventricular extension into the left more than right lateral ventricles and third ventricle. There has been some clearing of blood from the fourth ventricle. No significant change in ventricular volume. The thalamic component (which also extends into adjacent white matter tracks) measures up to 3 x 3.4 x 3.7 cm. Edema has developed around the hematoma without change in local mass effect. No visible cortex infarct. Vascular: No hyperdense vessel or unexpected calcification. Skull: Normal. Negative for fracture or focal lesion. Sinuses/Orbits: No acute finding. IMPRESSION: Size stable left thalamic hematoma with intraventricular extension. There has been some clearing of blood from the fourth ventricle; the ventricular volume is essentially stable. Electronically Signed   By: Marnee Spring M.D.   On: 05/06/2020 05:10   CT Head Wo Contrast  Result Date: 05/05/2020 CLINICAL DATA:  Follow-up intracranial hemorrhage EXAM: CT HEAD WITHOUT CONTRAST TECHNIQUE: Contiguous axial images were obtained from the base of the skull through the vertex without intravenous contrast. COMPARISON:  Earlier same day FINDINGS: Brain: Intraparenchymal hemorrhage in the region of the left thalamus previously measuring about 2.5 cm in diameter now measures almost 3.5 cm in diameter. There has been worsening of intraventricular penetration with blood now filling the left lateral ventricle and extending into the third and fourth ventricles. There is now mass effect with left-to-right midline shift of 7-8 mm. Vascular: No new vascular finding. Skull: Normal Sinuses/Orbits: Clear/normal Other: None IMPRESSION: 1. Intraparenchymal hemorrhage in the region of the left thalamus previously measuring about 2.5 cm in diameter now measures almost 4 cm in diameter. There has been worsening of intraventricular penetration with blood now filling the left  lateral ventricle and extending into the third and fourth ventricles. 2. These results were called by telephone at the time of interpretation on 05/05/2020 at  8:41 pm to provider Meridee ScoreMICHAEL Clarence Cogswell , who verbally acknowledged these results. Electronically Signed   By: Paulina FusiMark  Shogry M.D.   On: 05/05/2020 20:42   DG Chest Port 1 View  Result Date: 05/05/2020 CLINICAL DATA:  Intracerebral hemorrhage EXAM: PORTABLE CHEST 1 VIEW COMPARISON:  02/28/2020 FINDINGS: No focal opacity or pleural effusion. Borderline cardiomegaly with mild central congestion and suspicion of mild interstitial edema. No pneumothorax. IMPRESSION: Borderline cardiomegaly with mild central congestion and suspicion of mild interstitial edema. Electronically Signed   By: Jasmine PangKim  Fujinaga M.D.   On: 05/05/2020 20:09   CT HEAD CODE STROKE WO CONTRAST  Result Date: 05/05/2020 CLINICAL DATA:  Code stroke. Initial evaluation for acute neuro deficit, stroke suspected. EXAM: CT HEAD WITHOUT CONTRAST TECHNIQUE: Contiguous axial images were obtained from the base of the skull through the vertex without intravenous contrast. COMPARISON:  None. FINDINGS: Brain: 2.3 x 2.1 x 2.9 cm acute intraparenchymal hemorrhage centered at the left thalamic capsular region (series 3, image 16, estimated volume 7 cc). Minimal surrounding vasogenic edema without significant regional mass effect or midline shift. Associated intraventricular extension with trace intraventricular blood seen within the adjacent left lateral ventricle. No hydrocephalus or ventricular trapping. No other acute intracranial hemorrhage. No other acute large vessel territory infarct. Underlying age-related cerebral atrophy with mild chronic small vessel ischemic disease. No extra-axial fluid collection. Vascular: No hyperdense vessel. Skull: Scalp soft tissues and calvarium within normal limits. Sinuses/Orbits: Globes and orbital soft tissues demonstrate no acute finding. Mild scattered mucosal thickening  noted within the ethmoidal air cells. Paranasal sinuses mastoid air cells are otherwise clear. Other: None. ASPECTS [facility]([place] Stroke Program Early CT Score) Does not apply due to ICH. IMPRESSION: 1. 2.3 x 2.1 x 2.9 cm acute intraparenchymal hemorrhage centered at the left thalamic capsular region, estimated volume 7 cc. Minimal surrounding vasogenic edema without significant regional mass effect or midline shift. Associated intraventricular extension with small volume intraventricular blood within the adjacent left lateral ventricle. No hydrocephalus or ventricular trapping. 2. No other acute intracranial abnormality. 3. Underlying age-related cerebral atrophy with mild chronic small vessel ischemic disease. These results were communicated to Dr. Otelia LimesLindzen at 6:43 pmon 10/6/2021by text page via the [facility] messaging system. Electronically Signed   By: Rise MuBenjamin  McClintock M.D.   On: 05/05/2020 18:44    Procedures .Critical Care Performed by: Terrilee FilesButler, Marcie Shearon C, MD Authorized by: Terrilee FilesButler, Doneen Ollinger C, MD   Critical care provider statement:    Critical care time (minutes):  90   Critical care time was exclusive of:  Separately billable procedures and treating other patients   Critical care was necessary to treat or prevent imminent or life-threatening deterioration of the following conditions:  CNS failure or compromise   Critical care was time spent personally by me on the following activities:  Discussions with consultants, evaluation of patient's response to treatment, examination of patient, ordering and performing treatments and interventions, ordering and review of laboratory studies, ordering and review of radiographic studies, pulse oximetry, re-evaluation of patient's condition, obtaining history from patient or surrogate, review of old charts and development of treatment plan with patient or surrogate   I assumed direction of critical care for this patient from another provider in my specialty: no      (including critical care time)  Medications Ordered in ED Medications  clevidipine (CLEVIPREX) infusion 0.5 mg/mL (21 mg/hr Intravenous New Bag/Given 05/05/20 2237)   stroke: mapping our early stages of recovery book (has no administration in time range)  acetaminophen (TYLENOL)  tablet 650 mg (has no administration in time range)    Or  acetaminophen (TYLENOL) 160 MG/5ML solution 650 mg (has no administration in time range)    Or  acetaminophen (TYLENOL) suppository 650 mg (has no administration in time range)  senna-docusate (Senokot-S) tablet 1 tablet (has no administration in time range)  pantoprazole (PROTONIX) injection 40 mg (has no administration in time range)  ferrous sulfate tablet 325 mg (325 mg Oral Not Given 05/05/20 2125)  0.9 %  sodium chloride infusion (has no administration in time range)  ondansetron (ZOFRAN) injection 4 mg (4 mg Intravenous Given 05/05/20 2048)    ED Course  I have reviewed the triage vital signs and the nursing notes.  Pertinent labs & imaging results that were available during my care of the patient were reviewed by me and considered in my medical decision making (see chart for details).  Clinical Course as of May 06 1022  Wed May 05, 2020  1850 CT head with no anemic intraparenchymal bleed.   [MB]  1934 Laboratory work with platelets 6000.  They were in the normal range 3 weeks ago.  We will repeat and send a type and screen.  Neurology updated.  Blood pressure parameters to titrate Cleviprex to blood pressure less than 140.   [MB]  1934 Repeat CBC showing platelets of 149,000.  Canceled platelet transfusion.   [MB]  2010 Reassessed patient he has more right facial droop and is drooling a little bit.  He seems a little less interactive although is answering questions.  Updated neurology.   [MB]  2014 Neurology at bedside.  Going back for repeat head CT.  Platelets are ordered.  Repeat CBC still pending.   [MB]  2022 Repeat head CT shows  significant extension of the ventricles with some element of hydrocephalus.  Trying to confirm code status   [MB]  2028 Patient has vomited all times here. Zofran has been ordered. Currently he is tolerating his secretions. Neurology Dr. Wilford Corner is considering the patient to be a full code.    [MB]  2032 Patient's niece is here now and I updated her on the current status.  Dr. Wilford Corner contacted neurosurgery and they do not feel he needs an EVD currently.   [MB]  2323 Reassessment: Patient still somnolent.  Tolerating his own secretions.  Confirm with Dr. Wilford Corner, patient is pending.  He currently does not feel patient needs to be admitted nor do I.  Will continue to observe.  Currently patient has a bed in the ICU is hopefully he will be going upstairs soon.   [MB]    Clinical Course User Index [MB] Terrilee Files, MD   MDM Rules/Calculators/A&P                         This patient complains of headache, right arm and leg weakness; this involves an extensive number of treatment Options and is a complaint that carries with it a high risk of complications and Morbidity. The differential includes stroke, bleed, tumor, seizure, dissection  I ordered, reviewed and interpreted labs, which included CBC with normal white count, slightly low hemoglobin, critically low platelets.  This was repeated and platelets were in the normal range.  Chemistries normal other than a slightly elevated potassium. I ordered medication IV Cleviprex for management of hypertension in setting of intracranial hemorrhage I ordered imaging studies which included CT head and I independently    visualized and interpreted imaging which  showed intraparenchymal hemorrhage, CT was repeated again with mental status change in his head ventricular extension Additional history obtained from patient's significant other and patient's niece Previous records obtained and reviewed in epic, no recent admissions I consulted neurology Dr.Aroor  And discussed lab and imaging findings  Critical Interventions: Identification of and management of intraparenchymal hemorrhage.  IV medications for tight blood pressure control.  He also needed frequent neuro checks to assess respiratory status.  After the interventions stated above, I reevaluated the patient and found patient to be somnolent but arousable.  Currently handling his secretions.  He is pending a repeat CT in 6 hours to see if there is further progression necessitating EVD placement by neurosurgery.  Awaiting ICU bed.  Final Clinical Impression(s) / ED Diagnoses Final diagnoses:  Nontraumatic intracerebral hemorrhage, unspecified cerebral location, unspecified laterality [facility])    Rx / DC Orders ED Discharge Orders    None       Terrilee Files, MD 05/06/20 1026

## 2020-05-05 NOTE — Code Documentation (Signed)
Stroke Response Nurse Documentation Code Documentation  Alan Everett is a 64 y.o. male arriving to [facility] ED via [facility] on 05/05/20 with past medical hx of alcohol and tobacco use. Code stroke was activated by EMS, ED. Patient from home where he was LKW at 1600. Patient fell getting out of chair and noted right weakness in leg. SBP elevated in low 200s with EMS.  Stroke team at the bedside on patient arrival. Patient cleared for CT by Dr. Charm Barges. Patient to CT with team. NIHSS 5, see documentation for details and code stroke times. Patient with disoriented, right leg weakness, right limb ataxia, right decreased sensation and Global aphasia  on exam. The following imaging was completed: CT.   Patient is not a candidate for tPA due to hemorrhage on scan.Dificulty getting IV access. IV team notified. Cleviprex gtt ready at bedside. Care/Plan- once IV placed, SBP<140 with Q1 neuro and VS. Bedside handoff with ED RN.  Sherlyn Lees  Stroke Response RN

## 2020-05-05 NOTE — Progress Notes (Signed)
Neurology Progress Note   S:// Notified by Dr. Charm Barges regarding patient being more somnolent and worsening right-sided deficits. Examined the patient emergently in the emergency room. Patient earlier seen and admitted by Dr. Tamela Gammon left thalamic capsular bleeding.  Also spoke with the significant other to clarify CODE STATUS.  She gave number for niece, who have left a message for. The patient was not very clear when he was answering yes or no questions to his CODE STATUS.  He has not had any conversation about CODE STATUS with his significant other. For now, I will change his CODE STATUS to full code given that there is no clear answer on the CODE STATUS and will err on the side of keeping him full code-he was a full code during last hospitalization at [facility].  O:// Current vital signs: BP (!) 164/70   Pulse 68   Temp (!) 97.3 F (36.3 C) (Axillary)   Resp 16   Wt 65.7 kg   SpO2 95%   BMI 23.38 kg/m  Vital signs in last 24 hours: Temp:  [97.3 F (36.3 C)] 97.3 F (36.3 C) (10/06 1930) Pulse Rate:  [68] 68 (10/06 1927) Resp:  [15-26] 16 (10/06 1931) BP: (164-208)/(70-74) 164/70 (10/06 1931) SpO2:  [95 %-100 %] 95 % (10/06 1931) Weight:  [65.7 kg] 65.7 kg (10/06 1800) General: Patient is drowsy but opens eyes to voice. HEENT: Normocephalic atraumatic Lungs: Clear Abdomen nondistended nontender Neurological exam He is drowsy but opens eyes to voice. He does not follow commands His speech is extremely garbled and dysarthric Cranial nerves: Pupils are equal round reactive light, he has a leftward gaze preference and cannot go all the way to the right, does not blink to threat from the right, right face has significant weakness at rest. Motor exam: Right arm is flexed and immobile.  Trace movement to noxious stimulation.  Right leg moves with noxious stimulation some-this is all worse from prior exam from Dr. Otelia Limes, who had also come into the ER when notified  of patient's status change. Sensory exam: As above NIH stroke scale 1a Level of Conscious.: 1 1b LOC Questions: 2 1c LOC Commands: 2 2 Best Gaze: 1 3 Visual: 2 4 Facial Palsy: 2 5a Motor Arm - left: 0 5b Motor Arm - Right: 3 6a Motor Leg - Left: 0 6b Motor Leg - Right: 2 7 Limb Ataxia: 0 8 Sensory: 0 9 Best Language: 2 10 Dysarthria: 2 11 Extinct. and Inatten.: 0 TOTAL: 19   Medications  Current Facility-Administered Medications:  .   stroke: mapping our early stages of recovery book, , Does not apply, Once, Caryl Pina, MD .  0.9 %  sodium chloride infusion, 10 mL/hr, Intravenous, Once, Terrilee Files, MD .  acetaminophen (TYLENOL) tablet 650 mg, 650 mg, Oral, Q4H PRN **OR** acetaminophen (TYLENOL) 160 MG/5ML solution 650 mg, 650 mg, Per Tube, Q4H PRN **OR** acetaminophen (TYLENOL) suppository 650 mg, 650 mg, Rectal, Q4H PRN, Caryl Pina, MD .  clevidipine (CLEVIPREX) infusion 0.5 mg/mL, 0-21 mg/hr, Intravenous, Continuous, Caryl Pina, MD, Last Rate: 2 mL/hr at 05/05/20 1922, 1 mg/hr at 05/05/20 1922 .  ferrous sulfate tablet 325 mg, 325 mg, Oral, Daily, Caryl Pina, MD .  pantoprazole (PROTONIX) injection 40 mg, 40 mg, Intravenous, QHS, Caryl Pina, MD .  senna-docusate (Senokot-S) tablet 1 tablet, 1 tablet, Oral, BID, Caryl Pina, MD  Current Outpatient Medications:  .  acetaminophen (TYLENOL) 500 MG tablet, Take 500-1,000 mg by mouth every 6 (six) hours  as needed for mild pain, moderate pain or fever. (Patient not taking: Reported on 03/10/2020), Disp: , Rfl:  .  docusate sodium (STOOL SOFTENER) 100 MG capsule, Take 100 mg by mouth 2 (two) times daily., Disp: , Rfl:  .  Iron, Ferrous Sulfate, 325 (65 Fe) MG TABS, Take 325 mg by mouth daily., Disp: 30 tablet, Rfl: 2 Labs CBC    Component Value Date/Time   WBC 3.0 (L) 05/05/2020 1835   RBC 4.39 05/05/2020 1835   HGB 14.6 05/05/2020 1928   HCT 43.0 05/05/2020 1928   PLT 6 (LL) 05/05/2020 1835   MCV 92.9  05/05/2020 1835   MCH 28.2 05/05/2020 1835   MCHC 30.4 05/05/2020 1835   RDW 20.4 (H) 05/05/2020 1835   LYMPHSABS 0.8 05/05/2020 1835   MONOABS 0.4 05/05/2020 1835   EOSABS 0.0 05/05/2020 1835   BASOSABS 0.0 05/05/2020 1835    CMP     Component Value Date/Time   NA 139 05/05/2020 1928   K 5.2 (H) 05/05/2020 1928   CL 108 05/05/2020 1928   CO2 20 (L) 05/05/2020 1835   GLUCOSE 99 05/05/2020 1928   BUN 11 05/05/2020 1928   CREATININE 0.80 05/05/2020 1928   CALCIUM 9.5 05/05/2020 1835   PROT 8.6 (H) 05/05/2020 1835   ALBUMIN 3.9 05/05/2020 1835   AST 68 (H) 05/05/2020 1835   ALT 64 (H) 05/05/2020 1835   ALKPHOS 90 05/05/2020 1835   BILITOT 0.8 05/05/2020 1835   GFRNONAA >60 05/05/2020 1835   GFRAA >60 02/29/2020 0455    Imaging I have reviewed images in epic and the results pertinent to this consultation are: CT scan  Worsened ICH with IVH now, also visible as evidence of hydrocephalus  Assessment:  ICH-likely hypertensive Worsening of ICH with IVH and hydrocephalus. Worsening of clinical exam correlating with the above bleed.  Recommendations: Platelet count was 6000.  Rechecked and came out to 149,000. No need for platelet transfusion Emergent neurosurgical consultation-spoke with Leo Grosser from the neurosurgical service who will be evaluating the patient for a potential EVD. Patient also has been throwing up-IV Zofran as needed. Might need intubation for airway protection.  Will reassess with ED providers.   -- Milon Dikes, MD [facility] Pager: [phone] If 7pm to 7am, please call on call as listed on AMION.    Addendum Spoke with Dr. Yetta Barre in neurosurgery who recommends waiting prior to decision on EVT given that the hydrocephalus is not expansive and might make procedure difficult unless there is frank hydrocephalus. We will repeat imaging. We will plan to recall neurosurgery then if gross hydrocephalus is seen or before if exam worsens  and imaging needs to be done earlier.   CRITICAL CARE ATTESTATION Performed by: Milon Dikes, MD Total additional critical care time: Critical care time was exclusive of separately billable procedures and treating other patients and/or supervising APPs/Residents/Students Critical care was necessary to treat or prevent imminent or life-threatening deterioration due to ICH, IVH This patient is critically ill and at significant risk for neurological worsening and/or death and care requires constant monitoring. Critical care was time spent personally by me on the following activities: development of treatment plan with patient and/or surrogate as well as nursing, discussions with consultants, evaluation of patient's response to treatment, examination of patient, obtaining history from patient or surrogate, ordering and performing treatments and interventions, ordering and review of laboratory studies, ordering and review of radiographic studies, pulse oximetry, re-evaluation of patient's condition, participation in multidisciplinary rounds and  medical decision making of high complexity in the care of this patient.

## 2020-05-05 NOTE — H&P (Signed)
Admission H&P    Chief Complaint: Acute onset of right sided weakness  HPI: Alan Everett is an 64 y.o. male with a PMHx of alcohol use and tobacco abuse who presents acutely to the ED via EMS as a Code Stroke for acute onset of right sided weakness. He denies headache but acknowledges that he has sensory loss and weakness involving his RUE and RLE. He states that he is weak on the right, His speech pattern is most consistent with a mixed receptive and expressive dysphasia. 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   CT head: 1. 2.3 x 2.1 x 2.9 cm acute intraparenchymal hemorrhage centered at the left thalamic capsular region, estimated volume 7 cc. Minimal surrounding vasogenic edema without significant regional mass effect or midline shift. Associated intraventricular extension with small volume intraventricular blood within the adjacent left lateral ventricle. No hydrocephalus or ventricular trapping. 2. No other acute intracranial abnormality. 3. Underlying age-related cerebral atrophy with mild chronic small vessel ischemic disease.  ICH score: 1  Past Medical History:  Diagnosis Date  . Alcohol use   . Tobacco abuse     Past Surgical History:  Procedure Laterality Date  . COLONOSCOPY N/A 03/01/2020   Procedure: COLONOSCOPY;  Surgeon: Toledo, Boykin Nearing, MD;  Location: [facility] ENDOSCOPY;  Service: Gastroenterology;  Laterality: N/A;  . ESOPHAGOGASTRODUODENOSCOPY N/A 03/01/2020   Procedure: ESOPHAGOGASTRODUODENOSCOPY (EGD);  Surgeon: Toledo, Boykin Nearing, MD;  Location: [facility] ENDOSCOPY;  Service: Gastroenterology;  Laterality: N/A;  . FOOT FRACTURE SURGERY Left     Family History  Problem Relation Age of Onset  . Cancer Mother        his mother died of cancer, but patient is not sure which type of cancner.   . Diabetes Mellitus II Niece    Social History:  reports that he has been smoking. He has never used smokeless tobacco. He reports current alcohol use of about 6.0  standard drinks of alcohol per week. He reports that he does not use drugs.  Allergies: No Known Allergies  (Not in a hospital admission)   ROS: As per HPI. Detailed ROS is not obtainable due to the patient's dysphasia.   Physical Examination: Weight 65.7 kg.  HEENT-  [place]/AT  Lungs - Respirations unlabored Extremities - No edema  Neurologic Examination: Mental Status:  Alert. Poor orientation. Speech is hesitant and fragmentary. Mild to moderate comprehension deficit. Naming with mild deficit when asked to name pinky, thumb and forefinger. No dysarthria. Perseverates frequently.  Cranial Nerves: II:  Visual fields grossly intact - the patient has difficulty following commands for testing. On 2/3 trials, there was no extinction to DSS. PERRL.  III,IV, VI: No ptosis. EOMI. No nystagmus.  V,VII: Subtle right facial droop. Hyperesthesia to temp right face.  VIII: Hearing intact to voice IX,X: No hypophonia XI: Head is midline XII: midline tongue extension  Motor: RUE 4/5 with mildly increased latency of motor responses RLE 4/5 with mildly increased latency of motor responses. Drift also noted.  LUE and LLE 5/5 Sensory: Decreased temp and FT on the right. No extinction to DSS.  Deep Tendon Reflexes:  Mildly diminished on the right relative to the left.  Plantars: Equivocal bilaterally  Cerebellar: No ataxia with FNF on the left. Mild ataxia with FNF on the right. Difficulty with right H-S, but not disproportionate to his weakness.  Gait: Deferred in the context of increased falls risk  Results for orders placed or performed during the hospital encounter of 05/05/20 (from the past  48 hour(s))  CBG monitoring, ED     Status: Abnormal   Collection Time: 05/05/20  6:19 PM  Result Value Ref Range   Glucose-Capillary 102 (H) 70 - 99 mg/dL    Comment: Glucose reference range applies only to samples taken after fasting for at least 8 hours.   No results found.  Assessment: Acute left  thalamocapsular ICH 1.Exam findings are consistent with the location of the hemorrhage 2. ICH score: 1  3. Etiology for the hemorrhage is most likely hypertensive.  4. CT head: Acute intraparenchymal hemorrhage centered at the left thalamic capsular region, estimated volume 7 cc. Minimal surrounding vasogenic edema without significant regional mass effect or midline shift. Associated intraventricular extension with small volume intraventricular blood within the adjacent left lateral ventricle. No hydrocephalus or ventricular trapping.  Plan: 1. Admit to ICU under Neurology service 2. MRI/MRA of head 3. Carotid ultrasound 4. TTE 5. PT consult, OT consult, Speech consult 6. Cardiac telemetry 7. Frequent neuro checks 8. BP management with clevidipine drip. Goal SBP < 140 9. No antiplatelet medications or anticoagulants 10. The patient has stated that he wishes to be DNR. The question was posed to him several times in different ways to ensure that he comprehended what DNR is, as well as the potential implications of electing to not be resuscitated in the event of a cardiac or respiratory arrest. The patient demonstrated sufficient comprehension with this and other portions of the interview and therefore can be classified as being able to provide informed consent.   50 minutes spent in the emergent neurological evaluation and management of this critically ill patient  Electronically signed: [name] 05/05/2020, 6:32 PM

## 2020-05-06 ENCOUNTER — Encounter (HOSPITAL_COMMUNITY): Payer: Self-pay | Admitting: Neurology

## 2020-05-06 ENCOUNTER — Inpatient Hospital Stay (HOSPITAL_COMMUNITY): Payer: Medicaid Other

## 2020-05-06 DIAGNOSIS — F1410 Cocaine abuse, uncomplicated: Secondary | ICD-10-CM

## 2020-05-06 DIAGNOSIS — F17200 Nicotine dependence, unspecified, uncomplicated: Secondary | ICD-10-CM

## 2020-05-06 DIAGNOSIS — I6389 Other cerebral infarction: Secondary | ICD-10-CM

## 2020-05-06 DIAGNOSIS — I161 Hypertensive emergency: Secondary | ICD-10-CM

## 2020-05-06 DIAGNOSIS — R7401 Elevation of levels of liver transaminase levels: Secondary | ICD-10-CM

## 2020-05-06 LAB — URINALYSIS, ROUTINE W REFLEX MICROSCOPIC
Bilirubin Urine: NEGATIVE
Glucose, UA: NEGATIVE mg/dL
Ketones, ur: 5 mg/dL — AB
Leukocytes,Ua: NEGATIVE
Nitrite: NEGATIVE
Protein, ur: NEGATIVE mg/dL
Specific Gravity, Urine: 1.015 (ref 1.005–1.030)
pH: 5 (ref 5.0–8.0)

## 2020-05-06 LAB — ECHOCARDIOGRAM COMPLETE
Area-P 1/2: 2.8 cm2
S' Lateral: 2.7 cm
Weight: 2310.42 oz

## 2020-05-06 LAB — CBC WITH DIFFERENTIAL/PLATELET
Abs Immature Granulocytes: 0.2 10*3/uL — ABNORMAL HIGH (ref 0.00–0.07)
Basophils Absolute: 0 10*3/uL (ref 0.0–0.1)
Basophils Relative: 0 %
Eosinophils Absolute: 0.2 10*3/uL (ref 0.0–0.5)
Eosinophils Relative: 1 %
HCT: 36.2 % — ABNORMAL LOW (ref 39.0–52.0)
Hemoglobin: 11.6 g/dL — ABNORMAL LOW (ref 13.0–17.0)
Lymphocytes Relative: 6 %
Lymphs Abs: 1 10*3/uL (ref 0.7–4.0)
MCH: 29.4 pg (ref 26.0–34.0)
MCHC: 32 g/dL (ref 30.0–36.0)
MCV: 91.6 fL (ref 80.0–100.0)
Monocytes Absolute: 1 10*3/uL (ref 0.1–1.0)
Monocytes Relative: 6 %
Myelocytes: 1 %
Neutro Abs: 13.7 10*3/uL — ABNORMAL HIGH (ref 1.7–7.7)
Neutrophils Relative %: 86 %
Platelets: 154 10*3/uL (ref 150–400)
RBC: 3.95 MIL/uL — ABNORMAL LOW (ref 4.22–5.81)
RDW: 21.2 % — ABNORMAL HIGH (ref 11.5–15.5)
WBC: 15.9 10*3/uL — ABNORMAL HIGH (ref 4.0–10.5)
nRBC: 0 % (ref 0.0–0.2)
nRBC: 0 /100 WBC

## 2020-05-06 LAB — CBC
HCT: 40.8 % (ref 39.0–52.0)
Hemoglobin: 12.4 g/dL — ABNORMAL LOW (ref 13.0–17.0)
MCH: 28.2 pg (ref 26.0–34.0)
MCHC: 30.4 g/dL (ref 30.0–36.0)
MCV: 92.9 fL (ref 80.0–100.0)
Platelets: 6 10*3/uL — CL (ref 150–400)
RBC: 4.39 MIL/uL (ref 4.22–5.81)
RDW: 20.4 % — ABNORMAL HIGH (ref 11.5–15.5)
WBC: 3 10*3/uL — ABNORMAL LOW (ref 4.0–10.5)
nRBC: 0 % (ref 0.0–0.2)

## 2020-05-06 LAB — COMPREHENSIVE METABOLIC PANEL
ALT: 47 U/L — ABNORMAL HIGH (ref 0–44)
AST: 52 U/L — ABNORMAL HIGH (ref 15–41)
Albumin: 3.5 g/dL (ref 3.5–5.0)
Alkaline Phosphatase: 73 U/L (ref 38–126)
Anion gap: 11 (ref 5–15)
BUN: 10 mg/dL (ref 8–23)
CO2: 20 mmol/L — ABNORMAL LOW (ref 22–32)
Calcium: 9.2 mg/dL (ref 8.9–10.3)
Chloride: 106 mmol/L (ref 98–111)
Creatinine, Ser: 0.97 mg/dL (ref 0.61–1.24)
GFR calc non Af Amer: >60 mL/min (ref >60)
Glucose, Bld: 151 mg/dL — ABNORMAL HIGH (ref 70–99)
Potassium: 3.9 mmol/L (ref 3.5–5.1)
Sodium: 137 mmol/L (ref 135–145)
Total Bilirubin: 0.6 mg/dL (ref 0.3–1.2)
Total Protein: 7.6 g/dL (ref 6.5–8.1)

## 2020-05-06 LAB — RAPID URINE DRUG SCREEN, HOSP PERFORMED
Amphetamines: NOT DETECTED
Barbiturates: NOT DETECTED
Benzodiazepines: NOT DETECTED
Cocaine: POSITIVE — AB
Opiates: NOT DETECTED
Tetrahydrocannabinol: NOT DETECTED

## 2020-05-06 LAB — MRSA PCR SCREENING: MRSA by PCR: NEGATIVE

## 2020-05-06 MED ORDER — LABETALOL HCL 5 MG/ML IV SOLN
5.0000 mg | INTRAVENOUS | Status: DC | PRN
  Administered 2020-05-06: 10 mg via INTRAVENOUS
  Filled 2020-05-06 (×2): qty 4

## 2020-05-06 MED ORDER — SODIUM CHLORIDE 0.9 % IV SOLN: INTRAVENOUS | Status: DC

## 2020-05-06 MED ORDER — HYDRALAZINE HCL 20 MG/ML IJ SOLN
5.0000 mg | INTRAMUSCULAR | Status: DC | PRN
  Administered 2020-05-06 – 2020-05-07 (×7): 10 mg via INTRAVENOUS
  Administered 2020-05-08: 5 mg via INTRAVENOUS
  Filled 2020-05-06 (×7): qty 1

## 2020-05-06 NOTE — Evaluation (Signed)
Clinical/Bedside Swallow Evaluation Patient Details  Name: Alan Everett MRN: [id number] Date of Birth: 1955/11/01  Today's Date: 05/06/2020 Time: SLP Start Time (ACUTE ONLY): 0810 SLP Stop Time (ACUTE ONLY): 0826 SLP Time Calculation (min) (ACUTE ONLY): 16 min  Past Medical History:  Past Medical History:  Diagnosis Date  . Alcohol use   . Tobacco abuse    Past Surgical History:  Past Surgical History:  Procedure Laterality Date  . COLONOSCOPY N/A 03/01/2020   Procedure: COLONOSCOPY;  Surgeon: Toledo, Boykin Nearing, MD;  Location: [facility] ENDOSCOPY;  Service: Gastroenterology;  Laterality: N/A;  . ESOPHAGOGASTRODUODENOSCOPY N/A 03/01/2020   Procedure: ESOPHAGOGASTRODUODENOSCOPY (EGD);  Surgeon: Toledo, Boykin Nearing, MD;  Location: [facility] ENDOSCOPY;  Service: Gastroenterology;  Laterality: N/A;  . FOOT FRACTURE SURGERY Left    HPI:  Alan Everett is a 64 y.o. male with a PMHx of alcohol use and tobacco abuse who presents acutely to the ED via EMS as a Code Stroke for acute onset of right sided weakness. Patient with disoriented, right leg weakness, right limb ataxia, right decreased sensation and Global aphasia on exam. 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 Worsening of intraventricular   Assessment / Plan / Recommendation Clinical Impression  Pt seen for BSE and was severely lethargic. He was unable to follow simple commands, therefore oral mechanism exam could not be completed. Max verbal, visual, and tactile cues were given throughout the session to increase responsiveness, but were unsuccessful. Pt demonstrated poor awareness of POs, oral holding, and he was unable to initiate a swallow with ice chips and puree. He demonstrated a delayed cough following 2 POs of ice chips. Right buccal pocketing of puree was also observed and bolus had to be suctioned from oral cavity as he would not trigger a swallow. Given pt's lethargy and decreased responsiveness, recommend pt remain NPO. SLP  will f/u acutely.  SLP Visit Diagnosis: Dysphagia, unspecified (R13.10)    Aspiration Risk  Moderate aspiration risk;Risk for inadequate nutrition/hydration    Diet Recommendation NPO   Medication Administration: Via alternative means    Other  Recommendations Oral Care Recommendations: Oral care QID   Follow up Recommendations        Frequency and Duration min 2x/week  2 weeks       Prognosis Prognosis for Safe Diet Advancement: Fair Barriers to Reach Goals: Cognitive deficits;Behavior      Swallow Study   General HPI: Alan Everett is a 64 y.o. male with a PMHx of alcohol use and tobacco abuse who presents acutely to the ED via EMS as a Code Stroke for acute onset of right sided weakness. Patient with disoriented, right leg weakness, right limb ataxia, right decreased sensation and Global aphasia on exam. 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 Worsening of intraventricular Type of Study: Bedside Swallow Evaluation Diet Prior to this Study: NPO Temperature Spikes Noted: No Respiratory Status: Room air History of Recent Intubation: No Behavior/Cognition: Lethargic/Drowsy;Doesn't follow directions Oral Cavity Assessment: Dried secretions Oral Care Completed by SLP: No Self-Feeding Abilities: Total assist Patient Positioning: Upright in bed Baseline Vocal Quality: Not observed    Oral/Motor/Sensory Function Overall Oral Motor/Sensory Function: Other (comment) (Unable to assess)   Ice Chips Ice chips: Impaired Presentation: Spoon Oral Phase Impairments: Poor awareness of bolus Oral Phase Functional Implications: Right lateral sulci pocketing;Oral holding Pharyngeal Phase Impairments: Unable to trigger swallow;Cough - Delayed   Thin Liquid Thin Liquid: Not tested    Nectar Thick Nectar Thick Liquid: Not tested   Honey  Thick Honey Thick Liquid: Not tested   Puree Puree: Impaired Presentation: Spoon Oral Phase Impairments: Poor awareness of bolus Oral  Phase Functional Implications: Right lateral sulci pocketing   Solid     Solid: Not tested      [name] 05/06/2020,9:14 AM

## 2020-05-06 NOTE — Progress Notes (Signed)
OT Cancellation Note  Patient Details Name: Alan Everett MRN: [id number] DOB: 13-Jun-1956   Cancelled Treatment:    Reason Eval/Treat Not Completed: Active bedrest order  [facility] Jaysa Kise, OT/L   Acute OT Clinical Specialist Acute Rehabilitation Services Pager [phone] Office [phone]  05/06/2020, 6:46 AM

## 2020-05-06 NOTE — ED Notes (Signed)
Neuro change noted in pt, md notified, new orders received.

## 2020-05-06 NOTE — Progress Notes (Signed)
  Echocardiogram 2D Echocardiogram has been performed.  Tye Savoy 05/06/2020, 9:38 AM

## 2020-05-06 NOTE — Progress Notes (Signed)
STROKE TEAM PROGRESS NOTE   INTERVAL HISTORY RN is at the bedside. Pt drowsy but able to open eyes on voice. He still has left gaze preference, right HH, right hemiparesis and right facial droop. No family contact can be reached, MRI cancelled. With do CTA head and neck in am. This am CT repeat stable ICH and IVH. BP under control, off cleviprex now.   Vitals:   05/06/20 0915 05/06/20 0930 05/06/20 0945 05/06/20 1000  BP: (!) 128/57 124/60 (!) 137/56 128/70  Pulse: 80 80 77 76  Resp: 17 18 19 16   Temp:      TempSrc:      SpO2: 100% 99% 100% 100%  Weight:       CBC:  Recent Labs  Lab 05/05/20 2003 05/06/20 0738  WBC 8.8 15.9*  NEUTROABS 6.6 13.7*  HGB 12.3* 11.6*  HCT 40.8 36.2*  MCV 92.7 91.6  PLT 149* 154   Basic Metabolic Panel:  Recent Labs  Lab 05/05/20 1835 05/05/20 1835 05/05/20 1928 05/06/20 0738  NA 137   < > 139 137  K 3.9   < > 5.2* 3.9  CL 106   < > 108 106  CO2 20*  --   --  20*  GLUCOSE 101*   < > 99 151*  BUN 9   < > 11 10  CREATININE 0.97   < > 0.80 0.97  CALCIUM 9.5  --   --  9.2   < > = values in this interval not displayed.   Lipid Panel:  Recent Labs  Lab 05/05/20 2003  CHOL 146  TRIG 87  HDL 49  CHOLHDL 3.0  VLDL 17  LDLCALC 80   HgbA1c:  Recent Labs  Lab 05/05/20 2004  HGBA1C 4.6*   Urine Drug Screen:  Recent Labs  Lab 05/06/20 0752  LABOPIA NONE DETECTED  COCAINSCRNUR POSITIVE*  LABBENZ NONE DETECTED  AMPHETMU NONE DETECTED  THCU NONE DETECTED  LABBARB NONE DETECTED    Alcohol Level  Recent Labs  Lab 05/05/20 2003  ETH <10    IMAGING past 24 hours CT HEAD WO CONTRAST  Result Date: 05/06/2020 CLINICAL DATA:  Follow-up parenchymal hemorrhage EXAM: CT HEAD WITHOUT CONTRAST TECHNIQUE: Contiguous axial images were obtained from the base of the skull through the vertex without intravenous contrast. COMPARISON:  Yesterday FINDINGS: Brain: Left thalamic hematoma with extensive intraventricular extension into the left more  than right lateral ventricles and third ventricle. There has been some clearing of blood from the fourth ventricle. No significant change in ventricular volume. The thalamic component (which also extends into adjacent white matter tracks) measures up to 3 x 3.4 x 3.7 cm. Edema has developed around the hematoma without change in local mass effect. No visible cortex infarct. Vascular: No hyperdense vessel or unexpected calcification. Skull: Normal. Negative for fracture or focal lesion. Sinuses/Orbits: No acute finding. IMPRESSION: Size stable left thalamic hematoma with intraventricular extension. There has been some clearing of blood from the fourth ventricle; the ventricular volume is essentially stable. Electronically Signed   By: 07/06/2020 M.D.   On: 05/06/2020 05:10   CT Head Wo Contrast  Result Date: 05/05/2020 CLINICAL DATA:  Follow-up intracranial hemorrhage EXAM: CT HEAD WITHOUT CONTRAST TECHNIQUE: Contiguous axial images were obtained from the base of the skull through the vertex without intravenous contrast. COMPARISON:  Earlier same day FINDINGS: Brain: Intraparenchymal hemorrhage in the region of the left thalamus previously measuring about 2.5 cm in diameter now measures almost 3.5  cm in diameter. There has been worsening of intraventricular penetration with blood now filling the left lateral ventricle and extending into the third and fourth ventricles. There is now mass effect with left-to-right midline shift of 7-8 mm. Vascular: No new vascular finding. Skull: Normal Sinuses/Orbits: Clear/normal Other: None IMPRESSION: 1. Intraparenchymal hemorrhage in the region of the left thalamus previously measuring about 2.5 cm in diameter now measures almost 4 cm in diameter. There has been worsening of intraventricular penetration with blood now filling the left lateral ventricle and extending into the third and fourth ventricles. 2. These results were called by telephone at the time of  interpretation on 05/05/2020 at 8:41 pm to provider [facility] , who verbally acknowledged these results. Electronically Signed   By: Paulina Fusi M.D.   On: 05/05/2020 20:42   DG Chest Port 1 View  Result Date: 05/05/2020 CLINICAL DATA:  Intracerebral hemorrhage EXAM: PORTABLE CHEST 1 VIEW COMPARISON:  02/28/2020 FINDINGS: No focal opacity or pleural effusion. Borderline cardiomegaly with mild central congestion and suspicion of mild interstitial edema. No pneumothorax. IMPRESSION: Borderline cardiomegaly with mild central congestion and suspicion of mild interstitial edema. Electronically Signed   By: Jasmine Pang M.D.   On: 05/05/2020 20:09   CT HEAD CODE STROKE WO CONTRAST  Result Date: 05/05/2020 CLINICAL DATA:  Code stroke. Initial evaluation for acute neuro deficit, stroke suspected. EXAM: CT HEAD WITHOUT CONTRAST TECHNIQUE: Contiguous axial images were obtained from the base of the skull through the vertex without intravenous contrast. COMPARISON:  None. FINDINGS: Brain: 2.3 x 2.1 x 2.9 cm acute intraparenchymal hemorrhage centered at the left thalamic capsular region (series 3, image 16, estimated volume 7 cc). Minimal surrounding vasogenic edema without significant regional mass effect or midline shift. Associated intraventricular extension with trace intraventricular blood seen within the adjacent left lateral ventricle. No hydrocephalus or ventricular trapping. No other acute intracranial hemorrhage. No other acute large vessel territory infarct. Underlying age-related cerebral atrophy with mild chronic small vessel ischemic disease. No extra-axial fluid collection. Vascular: No hyperdense vessel. Skull: Scalp soft tissues and calvarium within normal limits. Sinuses/Orbits: Globes and orbital soft tissues demonstrate no acute finding. Mild scattered mucosal thickening noted within the ethmoidal air cells. Paranasal sinuses mastoid air cells are otherwise clear. Other: None. ASPECTS [facility]  Stroke Program Early CT Score) Does not apply due to ICH. IMPRESSION: 1. 2.3 x 2.1 x 2.9 cm acute intraparenchymal hemorrhage centered at the left thalamic capsular region, estimated volume 7 cc. Minimal surrounding vasogenic edema without significant regional mass effect or midline shift. Associated intraventricular extension with small volume intraventricular blood within the adjacent left lateral ventricle. No hydrocephalus or ventricular trapping. 2. No other acute intracranial abnormality. 3. Underlying age-related cerebral atrophy with mild chronic small vessel ischemic disease. These results were communicated to Dr. Otelia Limes at 6:43 pmon 10/6/2021by text page via the [facility] messaging system. Electronically Signed   By: Rise Mu M.D.   On: 05/05/2020 18:44    PHYSICAL EXAM  Temp:  [97.3 F (36.3 C)-99 F (37.2 C)] 99 F (37.2 C) (10/07 0801) Pulse Rate:  [68-103] 70 (10/07 1130) Resp:  [13-36] 18 (10/07 1130) BP: (116-208)/(42-98) 138/62 (10/07 1130) SpO2:  [94 %-100 %] 100 % (10/07 1130) Weight:  [65.5 kg-65.7 kg] 65.5 kg (10/07 0535)  General - Well nourished, well developed, in no apparent distress.  Ophthalmologic - fundi not visualized due to noncooperation.  Cardiovascular - Regular rhythm and rate.  Neuro - drowsy sleepy but open eyes on voice.  Global aphasia, able to make a few sounds but not words. Only followed commands of "eye opening" and "eye closing", no other commands. Not naming or repeating. Left gaze preference, abut able to cross midline. Right HH, not blinking to visual threat. PERRL, right upper and lower facial weakness. Tongue protrusion not cooperative. LUE at least 4/5 and LLE at least 3/5. However, RUE 0/5 mild withdraw to pain. RLE no spontaneous movement but withdraw to pain 3-/5. DTR 1+ and right positive babinski. Sensation, coordination not cooperative and gait not tested.   ASSESSMENT/PLAN [name] is a 64 y.o. male with history of  alcohol and tobacco abuse presenting with RUE and RLE weakness, R sensory loss and mixed aphasia. SBP > 200. CT w/ L thalamocapsular IPH w/ IVH. Neuro worsening in ED w/ IVH and hydrocephalus. NS consulted for EVD consideration, which was deferred.   Stroke:   L BG ICH w/ IVH d/t severe hypertension in setting of alcohol, cocaine use  Code Stroke CT head 10/6 1844 L thalamocapsular IPH 7cc w/ minimal edema, mild IVH. Small vessel disease. Atrophy.   CT head 10/6 2042 enlarging L thalamocapsular IPH w/ increasing IVH from L ventricle into 3rd and 4th ventricles.  CT head 10/7 0510 stable L thalamic IPH w/ IVH w/ clearing of blood in 4th ventricle.   CTA head and neck pending in am  MRI not able to perform due to no family contact to clear for mental  2D Echo EF 60-65%  LDL 80  HgbA1c 4.6  UDS positive for cocaine  VTE prophylaxis - SCDs   No antithrombotic prior to admission, now on No antithrombotic given IPH  Therapy recommendations:  pending - keep in bed today  Disposition:  pending   Cerebral Edema  CT head 10/6 2042 enlarging L thalamocapsular IPH w/ increasing IVH from L ventricle into 3rd and 4th ventricles.  Neurosurgery consulted Yetta Barre) no indication for EVD. Following.  CT head 10/7 0510 stable L thalamic IPH w/ IVH w/ clearing of blood in 4th ventricle.    CT head repeat 10/8 pending  Hypertensive Emergency  Per EMS, SBP > 200   Home meds:  none  Put on Cleviprex for BP control -> now off  SBP goal < 160 . Long-term BP goal normotensive  Leukocytosis  WBC 8.8->15.9  UA neg  CXR mild cardiomegaly w/ mild central congestion, suspicion mild interstitial edema   Hyperlipidemia  Home meds:  None  LDL 80, goal < 70  No statin in setting of IPH  Consider statin low dose on discharge  Dysphagia . Secondary to stroke . NPO . On IVF . Speech on board . Consider cortrak in am if not passing swallow   Tobacco abuse  Current  smoker  Smoking cessation counseling will be provided  Pt is willing to quit  Cocaine abuse  UDS positive for cocaine  Cocaine cessation will be provided  Avoid beta-blocker  Other Stroke Risk Factors  ETOH use, alcohol level <10, advised to drink no more than 2 drink(s) a day  Other Active Problems  Mild transaminitis, likely related to alcohol, AST/ALP 68/64->52/47  Hospital day # 1  This patient is critically ill due to left BG large ICH with IVH, hypertensive emergency, cocaine abuse and at significant risk of neurological worsening, death form hematoma expansion, hydrocephalus, cerebral edema, brain herniation, hypertensive encephalopathy, seizure. This patient's care requires constant monitoring of vital signs, hemodynamics, respiratory and cardiac monitoring, review of multiple databases, neurological assessment, discussion  with family, other specialists and medical decision making of high complexity. I spent 35 minutes of neurocritical care time in the care of this patient. I tried to contact the only contract in file, but the person did not pick up the phone.  Marvel PlanJindong Carmelo Reidel, MD PhD Stroke Neurology 05/06/2020 6:23 PM    To contact Stroke Continuity provider, please refer to [email or web address]. After hours, contact General Neurology

## 2020-05-06 NOTE — Progress Notes (Signed)
Repeat CT head done.  Scans reviewed.  Report reviewed.  Stable left thalamic hematoma with IVH.  There has been some clearing of the blood from the fourth ventricle and the ventricular volume is essentially unchanged.  Stroke team will continue to follow.  -- Milon Dikes, MD [facility] Pager: [phone] If 7pm to 7am, please call on call as listed on AMION.

## 2020-05-06 NOTE — TOC CAGE-AID Note (Signed)
Transition of Care [facility]) - CAGE-AID Screening   Patient Details  Name: Alan Everett MRN: [id number] Date of Birth: [birth year]  [facility] CM/SW Contact:    Jimmy Picket, [place] Phone Number: 05/06/2020, 2:17 PM   Clinical Narrative:  Pt was unable to participate in assessment due to being disoriented x4. PT was positive for cocaine on admission. Please re consult when medically stable.  CAGE-AID Screening: Substance Abuse Screening unable to be completed due to: : Patient unable to participate               Isabella Stalling Clinical Social Worker [phone]

## 2020-05-06 NOTE — Progress Notes (Signed)
PT Cancellation Note  Patient Details Name: Alan Everett MRN: [id number] DOB: 03/22/1956   Cancelled Treatment:    Reason Eval/Treat Not Completed: Active bedrest order. Pt remains on bedrest at this time. PT will attempt to follow up when off bedrest and appropriate for PT evaluation.    Arlyss Gandy 05/06/2020, 2:14 PM

## 2020-05-07 ENCOUNTER — Inpatient Hospital Stay (HOSPITAL_COMMUNITY): Payer: Medicaid Other

## 2020-05-07 DIAGNOSIS — F1910 Other psychoactive substance abuse, uncomplicated: Secondary | ICD-10-CM

## 2020-05-07 DIAGNOSIS — R509 Fever, unspecified: Secondary | ICD-10-CM

## 2020-05-07 DIAGNOSIS — E876 Hypokalemia: Secondary | ICD-10-CM

## 2020-05-07 DIAGNOSIS — R739 Hyperglycemia, unspecified: Secondary | ICD-10-CM

## 2020-05-07 DIAGNOSIS — I69391 Dysphagia following cerebral infarction: Secondary | ICD-10-CM

## 2020-05-07 DIAGNOSIS — R6510 Systemic inflammatory response syndrome (SIRS) of non-infectious origin without acute organ dysfunction: Secondary | ICD-10-CM

## 2020-05-07 DIAGNOSIS — I610 Nontraumatic intracerebral hemorrhage in hemisphere, subcortical: Secondary | ICD-10-CM

## 2020-05-07 LAB — PHOSPHORUS
Phosphorus: 2.6 mg/dL (ref 2.5–4.6)
Phosphorus: 2.6 mg/dL (ref 2.5–4.6)

## 2020-05-07 LAB — BASIC METABOLIC PANEL
Anion gap: 11 (ref 5–15)
BUN: 9 mg/dL (ref 8–23)
CO2: 21 mmol/L — ABNORMAL LOW (ref 22–32)
Calcium: 9.5 mg/dL (ref 8.9–10.3)
Chloride: 104 mmol/L (ref 98–111)
Creatinine, Ser: 0.81 mg/dL (ref 0.61–1.24)
GFR calc non Af Amer: >60 mL/min (ref >60)
Glucose, Bld: 166 mg/dL — ABNORMAL HIGH (ref 70–99)
Potassium: 3.4 mmol/L — ABNORMAL LOW (ref 3.5–5.1)
Sodium: 136 mmol/L (ref 135–145)

## 2020-05-07 LAB — CBC
HCT: 38.3 % — ABNORMAL LOW (ref 39.0–52.0)
Hemoglobin: 12 g/dL — ABNORMAL LOW (ref 13.0–17.0)
MCH: 28.8 pg (ref 26.0–34.0)
MCHC: 31.3 g/dL (ref 30.0–36.0)
MCV: 92.1 fL (ref 80.0–100.0)
Platelets: 136 10*3/uL — ABNORMAL LOW (ref 150–400)
RBC: 4.16 MIL/uL — ABNORMAL LOW (ref 4.22–5.81)
RDW: 20.8 % — ABNORMAL HIGH (ref 11.5–15.5)
WBC: 16.2 10*3/uL — ABNORMAL HIGH (ref 4.0–10.5)
nRBC: 0 % (ref 0.0–0.2)

## 2020-05-07 LAB — MAGNESIUM
Magnesium: 1.6 mg/dL — ABNORMAL LOW (ref 1.7–2.4)
Magnesium: 1.6 mg/dL — ABNORMAL LOW (ref 1.7–2.4)

## 2020-05-07 LAB — GLUCOSE, CAPILLARY
Glucose-Capillary: 176 mg/dL — ABNORMAL HIGH (ref 70–99)
Glucose-Capillary: 136 mg/dL — ABNORMAL HIGH (ref 70–99)
Glucose-Capillary: 153 mg/dL — ABNORMAL HIGH (ref 70–99)
Glucose-Capillary: 129 mg/dL — ABNORMAL HIGH (ref 70–99)

## 2020-05-07 MED ORDER — FERROUS SULFATE 300 (60 FE) MG/5ML PO SYRP
300.0000 mg | ORAL_SOLUTION | Freq: Every day | ORAL | Status: DC
  Administered 2020-05-08 – 2020-05-10 (×3): 300 mg
  Filled 2020-05-07 (×3): qty 5

## 2020-05-07 MED ORDER — VITAL HIGH PROTEIN PO LIQD: 1000.0000 mL | ORAL | Status: DC

## 2020-05-07 MED ORDER — SODIUM CHLORIDE 0.9% FLUSH
10.0000 mL | INTRAVENOUS | Status: DC | PRN
  Administered 2020-05-10 – 2020-06-10 (×3): 10 mL

## 2020-05-07 MED ORDER — SENNOSIDES-DOCUSATE SODIUM 8.6-50 MG PO TABS
1.0000 | ORAL_TABLET | Freq: Two times a day (BID) | ORAL | Status: DC
  Administered 2020-05-07 – 2020-06-01 (×35): 1
  Filled 2020-05-07 (×40): qty 1

## 2020-05-07 MED ORDER — IOHEXOL 350 MG/ML SOLN
80.0000 mL | Freq: Once | INTRAVENOUS | Status: AC | PRN
  Administered 2020-05-07: 80 mL via INTRAVENOUS

## 2020-05-07 MED ORDER — ORAL CARE MOUTH RINSE
15.0000 mL | Freq: Two times a day (BID) | OROMUCOSAL | Status: DC
  Administered 2020-05-07 – 2020-07-16 (×126): 15 mL via OROMUCOSAL

## 2020-05-07 MED ORDER — AMLODIPINE BESYLATE 10 MG PO TABS
10.0000 mg | ORAL_TABLET | Freq: Every day | ORAL | Status: DC
  Filled 2020-05-07: qty 1

## 2020-05-07 MED ORDER — SODIUM CHLORIDE 0.9% FLUSH
10.0000 mL | Freq: Two times a day (BID) | INTRAVENOUS | Status: DC
  Administered 2020-05-07 – 2020-05-21 (×26): 10 mL
  Administered 2020-05-22: 3 mL
  Administered 2020-05-22 – 2020-06-01 (×15): 10 mL
  Administered 2020-06-01: 3 mL
  Administered 2020-06-02 – 2020-06-29 (×52): 10 mL
  Administered 2020-06-29: 3 mL
  Administered 2020-06-30 – 2020-07-09 (×13): 10 mL

## 2020-05-07 MED ORDER — CHLORHEXIDINE GLUCONATE 0.12 % MT SOLN
15.0000 mL | Freq: Two times a day (BID) | OROMUCOSAL | Status: DC
  Administered 2020-05-07 – 2020-07-16 (×139): 15 mL via OROMUCOSAL
  Filled 2020-05-07 (×132): qty 15

## 2020-05-07 MED ORDER — PANTOPRAZOLE SODIUM 40 MG PO PACK
40.0000 mg | PACK | Freq: Every day | ORAL | Status: DC
  Administered 2020-05-07 – 2020-05-09 (×3): 40 mg
  Filled 2020-05-07 (×4): qty 20

## 2020-05-07 MED ORDER — LISINOPRIL 20 MG PO TABS
20.0000 mg | ORAL_TABLET | Freq: Every day | ORAL | Status: DC
  Filled 2020-05-07: qty 1

## 2020-05-07 MED ORDER — OSMOLITE 1.2 CAL PO LIQD
1000.0000 mL | ORAL | Status: AC
  Administered 2020-05-07 – 2020-05-30 (×24): 1000 mL
  Filled 2020-05-07 (×19): qty 1000

## 2020-05-07 MED ORDER — LISINOPRIL 20 MG PO TABS
20.0000 mg | ORAL_TABLET | Freq: Two times a day (BID) | ORAL | Status: DC
  Administered 2020-05-07 – 2020-05-23 (×30): 20 mg
  Filled 2020-05-07 (×33): qty 1

## 2020-05-07 MED ORDER — LISINOPRIL 20 MG PO TABS
20.0000 mg | ORAL_TABLET | Freq: Every day | ORAL | Status: DC
  Administered 2020-05-07: 20 mg

## 2020-05-07 MED ORDER — LABETALOL HCL 5 MG/ML IV SOLN
10.0000 mg | Freq: Once | INTRAVENOUS | Status: DC
  Filled 2020-05-07: qty 4

## 2020-05-07 MED ORDER — LABETALOL HCL 5 MG/ML IV SOLN
20.0000 mg | Freq: Once | INTRAVENOUS | Status: AC
  Administered 2020-05-07: 20 mg via INTRAVENOUS
  Filled 2020-05-07: qty 4

## 2020-05-07 MED ORDER — PROSOURCE TF PO LIQD: 45.0000 mL | Freq: Two times a day (BID) | ORAL | Status: DC

## 2020-05-07 MED ORDER — WHITE PETROLATUM EX OINT
TOPICAL_OINTMENT | CUTANEOUS | Status: AC
  Filled 2020-05-07: qty 28.35

## 2020-05-07 MED ORDER — AMLODIPINE BESYLATE 10 MG PO TABS
10.0000 mg | ORAL_TABLET | Freq: Every day | ORAL | Status: DC
  Administered 2020-05-07 – 2020-05-25 (×16): 10 mg
  Filled 2020-05-07 (×17): qty 1

## 2020-05-07 NOTE — Evaluation (Signed)
Occupational Therapy Evaluation Patient Details Name: Alan Everett MRN: [id number] DOB: 12/31/1955 Today's Date: 05/07/2020    History of Present Illness Patient is a 64 y/o male with PMH ETOH use, tobacco abuse, admitted with R side weakness,sensory loss and speech deficits.  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   Clinical Impression   This 64 yo male admitted with above presents to acute OT with unknown PLOF and pt unable to tell us due to expressive difficulties. He currently is max A +2-Mod A for all bed mobility and maxA-total A for all basic ADLs. He will benefit from acute OT with current recommendation CIR (pending progression and A available at home). We will continue to follow.    Follow Up Recommendations  CIR;Supervision/Assistance - 24 hour    Equipment Recommendations  Other (comment) (TBD next venue)       Precautions / Restrictions Precautions Precautions: Fall Precaution Comments: pushes to R, coretrack, BP <180 Restrictions Weight Bearing Restrictions: No      Mobility Bed Mobility Overal bed mobility: Needs Assistance Bed Mobility: Rolling;Sidelying to Sit Rolling: Mod assist Sidelying to sit: HOB elevated;Max assist;+2 for physical assistance       General bed mobility comments: rolling to R pulling with L UE and assist for legs off bed, and to lift trunk upright  Transfers Overall transfer level: Needs assistance   Transfers: Lateral/Scoot Transfers          Lateral/Scoot Transfers: +2 physical assistance;Max assist General transfer comment: scooting up toward [facility] several scoots cues and assist for anterior R weight shift and used pad under hips to scoot toward HOB    Balance Overall balance assessment: Needs assistance Sitting-balance support: Feet supported Sitting balance-Leahy Scale: Zero Sitting balance - Comments: can sit with L hand on bedrail, without support for about  a minute, without holding rail needs mod A and cues unless pushing to R then needs max A                                   ADL either performed or assessed with clinical judgement   ADL Overall ADL's : Needs assistance/impaired Eating/Feeding: NPO   Grooming: Wash/dry face Grooming Details (indicate cue type and reason): max-total A for sitting EOB balance, washed face minimally then stopped--asked him to wash face more and he did a more through job this time Upper Body Bathing: Maximal assistance;Sitting Upper Body Bathing Details (indicate cue type and reason): EOB, max-total A for balance Lower Body Bathing: Total assistance;Bed level   Upper Body Dressing : Total assistance;Sitting   Lower Body Dressing: Total assistance;Bed level                       Vision   Vision Assessment?: Vision impaired- to be further tested in functional context Additional Comments: Able to occassionally look to midline with increased cues,            Pertinent Vitals/Pain Pain Assessment: Faces Faces Pain Scale: No hurt     Hand Dominance Right   Extremity/Trunk Assessment Upper Extremity Assessment Upper Extremity Assessment: RUE deficits/detail RUE Deficits / Details: Extreme tone at elbow for flexion (able to "break" with increased time and pressure on bicep tenden)   Lower Extremity Assessment Lower Extremity Assessment: RLE deficits/detail;LLE deficits/detail RLE Deficits / Details: increased tone with flexion difficult, but initially not keeping  it all the way straight, mod ashworth scale 3/4 at ankle, knee and hip LLE Deficits / Details: moves antigravity, but did not move to command       Communication Communication Communication: Expressive difficulties;Receptive difficulties (global aphasia)   Cognition Arousal/Alertness: Lethargic Behavior During Therapy: Flat affect Overall Cognitive Status: Difficult to assess                                      General Comments  BP after supine from sitting 157 systolic               Prior Functioning/Environment          Comments: no family present, patient unable to state prior history, but was indpendent per chart        OT Problem List: Decreased strength;Decreased range of motion;Impaired balance (sitting and/or standing);Impaired vision/perception;Impaired UE functional use;Decreased knowledge of precautions;Decreased safety awareness;Decreased cognition      OT Treatment/Interventions: Self-care/ADL training;DME and/or AE instruction;Balance training;Therapeutic exercise;Splinting;Visual/perceptual remediation/compensation;Patient/family education    OT Goals(Current goals can be found in the care plan section) Acute Rehab OT Goals Patient Stated Goal: unable to state OT Goal Formulation: Patient unable to participate in goal setting Time For Goal Achievement: 05/21/20 Potential to Achieve Goals: Good  OT Frequency: Min 2X/week           Co-evaluation PT/OT/SLP Co-Evaluation/Treatment: Yes Reason for Co-Treatment: For patient/therapist safety;To address functional/ADL transfers PT goals addressed during session: Mobility/safety with mobility;Balance;Strengthening/ROM OT goals addressed during session: Strengthening/ROM;ADL's and self-care      AM-PAC OT "6 Clicks" Daily Activity     Outcome Measure Help from another person eating meals?: Total Help from another person taking care of personal grooming?: Total Help from another person toileting, which includes using toliet, bedpan, or urinal?: Total Help from another person bathing (including washing, rinsing, drying)?: Total Help from another person to put on and taking off regular upper body clothing?: Total Help from another person to put on and taking off regular lower body clothing?: Total 6 Click Score: 6   End of Session Nurse Communication: Mobility status  Activity Tolerance: Patient  tolerated treatment well Patient left: in bed;with call bell/phone within reach;with bed alarm set (cortrak getting ready to be put in)  OT Visit Diagnosis: Other abnormalities of gait and mobility (R26.89);Muscle weakness (generalized) (M62.81);Low vision, both eyes (H54.2);Other symptoms and signs involving cognitive function;Hemiplegia and hemiparesis Hemiplegia - Right/Left: Right                Time: 1540-0867 OT Time Calculation (min): 22 min Charges:  OT General Charges $OT Visit: 1 Visit OT Evaluation $OT Eval Moderate Complexity: 1 Mod  Ignacia Palma, OTR/L Acute [employer] Pager [phone] Office [phone]     Evette Georges 05/07/2020, 12:29 PM

## 2020-05-07 NOTE — Evaluation (Signed)
Physical Therapy Evaluation Patient Details Name: Alan Everett MRN: [id number] DOB: [birth year] Today's Date: 05/07/2020   History of Present Illness  Patient is a 64 y/o male with PMH ETOH use, tobacco abuse, admitted with R side weakness,sensory loss and speech deficits.  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  Clinical Impression  Patient presents with decreased mobility due to R UE/LE hemiparesis, decreased sensation, increased tone, decreased midline orientation with R lateral lean and pushing with L UE.  Patient previously was independent per chart, but home situation unknown as pt with aphasia.  Feel he would progress well with CIR level rehab if has support at home.  Recommend screening for rehab.  PT to continue to follow acutely    Follow Up Recommendations CIR;Supervision/Assistance - 24 hour    Equipment Recommendations  Wheelchair cushion (measurements PT);Wheelchair (measurements PT);3in1 (PT)    Recommendations for Other Services Rehab consult     Precautions / Restrictions Precautions Precautions: Fall Precaution Comments: pushes to R, coretrack, BP <180 Restrictions Weight Bearing Restrictions: No      Mobility  Bed Mobility Overal bed mobility: Needs Assistance Bed Mobility: Rolling;Sidelying to Sit Rolling: Mod assist Sidelying to sit: HOB elevated;Max assist;+2 for physical assistance       General bed mobility comments: rolling to R pulling with L UE and assist for legs off bed, and to lift trunk upright  Transfers Overall transfer level: Needs assistance   Transfers: Lateral/Scoot Transfers          Lateral/Scoot Transfers: +2 physical assistance;Max assist General transfer comment: scooting up toward [facility] several scoots cues and assist for anterior R weight shift and used pad under hips to scoot toward [facility]  Ambulation/Gait                Stairs            Wheelchair  Mobility    Modified Rankin (Stroke Patients Only) Modified Rankin (Stroke Patients Only) Pre-Morbid Rankin Score: No symptoms Modified Rankin: Severe disability     Balance Overall balance assessment: Needs assistance Sitting-balance support: Feet supported Sitting balance-Leahy Scale: Zero Sitting balance - Comments: can sit with L hand on bedrail, without support for about a minute, without holding rail needs mod A and cues unless pushing to R then needs max A                                     Pertinent Vitals/Pain Pain Assessment: Faces Faces Pain Scale: No hurt    Home Living                        Prior Function           Comments: no family present, patient unable to state prior history, but was indpendent per chart     Hand Dominance        Extremity/Trunk Assessment   Upper Extremity Assessment Upper Extremity Assessment: Defer to OT evaluation    Lower Extremity Assessment Lower Extremity Assessment: RLE deficits/detail;LLE deficits/detail RLE Deficits / Details: increased tone with flexion difficult, but initially not keeping it all the way straight, mod ashworth scale 3/4 at ankle, knee and hip LLE Deficits / Details: moves antigravity, but did not move to command       Communication   Communication: Expressive difficulties;Receptive difficulties (global aphasia)  Cognition Arousal/Alertness:  Lethargic Behavior During Therapy: Flat affect Overall Cognitive Status: Difficult to assess                                        General Comments General comments (skin integrity, edema, etc.): BP after supine from sitting 157 systolic    Exercises     Assessment/Plan    PT Assessment Patient needs continued PT services  PT Problem List Decreased strength;Decreased mobility;Decreased safety awareness;Decreased balance;Impaired sensation;Decreased cognition;Decreased activity tolerance;Decreased range of  motion;Impaired tone;Decreased knowledge of precautions       PT Treatment Interventions DME instruction;Therapeutic activities;Therapeutic exercise;Patient/family education;Balance training;Functional mobility training;Neuromuscular re-education    PT Goals (Current goals can be found in the Care Plan section)  Acute Rehab PT Goals Patient Stated Goal: unable to state PT Goal Formulation: Patient unable to participate in goal setting Time For Goal Achievement: 05/21/20 Potential to Achieve Goals: Good    Frequency Min 4X/week   Barriers to discharge        Co-evaluation PT/OT/SLP Co-Evaluation/Treatment: Yes Reason for Co-Treatment: For patient/therapist safety;To address functional/ADL transfers           AM-PAC PT "6 Clicks" Mobility  Outcome Measure Help needed turning from your back to your side while in a flat bed without using bedrails?: A Lot Help needed moving from lying on your back to sitting on the side of a flat bed without using bedrails?: Total Help needed moving to and from a bed to a chair (including a wheelchair)?: Total Help needed standing up from a chair using your arms (e.g., wheelchair or bedside chair)?: Total Help needed to walk in hospital room?: Total Help needed climbing 3-5 steps with a railing? : Total 6 Click Score: 7    End of Session   Activity Tolerance: Patient limited by fatigue Patient left: in bed;with call bell/phone within reach;with bed alarm set (dietician in the room to place coretrack) Nurse Communication: Mobility status;Need for lift equipment PT Visit Diagnosis: Other abnormalities of gait and mobility (R26.89);Hemiplegia and hemiparesis;Other symptoms and signs involving the nervous system (R29.898) Hemiplegia - Right/Left: Right Hemiplegia - dominant/non-dominant: Dominant Hemiplegia - caused by: Nontraumatic intracerebral hemorrhage    Time: 1030-1052 PT Time Calculation (min) (ACUTE ONLY): 22 min   Charges:   PT  Evaluation $PT Eval Moderate Complexity: 1 Mod          Alan Everett, PT Acute Rehabilitation Services [phone] Office:[phone] 05/07/2020   Alan Everett 05/07/2020, 11:11 AM

## 2020-05-07 NOTE — Plan of Care (Signed)
  Problem: Clinical Measurements: Goal: Will remain free from infection Outcome: Progressing Goal: Respiratory complications will improve Outcome: Progressing Goal: Cardiovascular complication will be avoided Outcome: Progressing   Problem: Nutrition: Goal: Adequate nutrition will be maintained Outcome: Progressing   Problem: Pain Managment: Goal: General experience of comfort will improve Outcome: Progressing   Problem: Safety: Goal: Ability to remain free from injury will improve Outcome: Progressing   Problem: Skin Integrity: Goal: Risk for impaired skin integrity will decrease Outcome: Progressing   

## 2020-05-07 NOTE — Progress Notes (Addendum)
STROKE TEAM PROGRESS NOTE   INTERVAL HISTORY RN at bedside.  Patient still has aphasia, left gaze and right hemiplegia.  CT repeat stable hematoma, however slightly increased midline shift and ventricular size.  We will keep patient in ICU today and repeat CT in a.m.  Vitals:   05/07/20 0730 05/07/20 0800 05/07/20 0814 05/07/20 0830  BP: (!) 152/77 (!) 163/70 (!) 154/55 (!) 160/65  Pulse: 80 87 71 76  Resp: 20 (!) 21 19 20   Temp:      TempSrc:      SpO2: 97% 98% 98% 98%  Weight:       CBC:  Recent Labs  Lab 05/05/20 2003 05/05/20 2003 05/06/20 0738 05/07/20 0337  WBC 8.8   < > 15.9* 16.2*  NEUTROABS 6.6  --  13.7*  --   HGB 12.3*   < > 11.6* 12.0*  HCT 40.8   < > 36.2* 38.3*  MCV 92.7   < > 91.6 92.1  PLT 149*   < > 154 136*   < > = values in this interval not displayed.   Basic Metabolic Panel:  Recent Labs  Lab 05/06/20 0738 05/07/20 0337  NA 137 136  K 3.9 3.4*  CL 106 104  CO2 20* 21*  GLUCOSE 151* 166*  BUN 10 9  CREATININE 0.97 0.81  CALCIUM 9.2 9.5   Lipid Panel:  Recent Labs  Lab 05/05/20 2003  CHOL 146  TRIG 87  HDL 49  CHOLHDL 3.0  VLDL 17  LDLCALC 80   HgbA1c:  Recent Labs  Lab 05/05/20 2004  HGBA1C 4.6*   Urine Drug Screen:  Recent Labs  Lab 05/06/20 0752  LABOPIA NONE DETECTED  COCAINSCRNUR POSITIVE*  LABBENZ NONE DETECTED  AMPHETMU NONE DETECTED  THCU NONE DETECTED  LABBARB NONE DETECTED    Alcohol Level  Recent Labs  Lab 05/05/20 2003  ETH <10    IMAGING past 24 hours CT ANGIO HEAD W OR WO CONTRAST  Result Date: 05/07/2020 CLINICAL DATA:  Parenchymal hemorrhage EXAM: CT ANGIOGRAPHY HEAD AND NECK TECHNIQUE: Multidetector CT imaging of the head and neck was performed using the standard protocol during bolus administration of intravenous contrast. Multiplanar CT image reconstructions and MIPs were obtained to evaluate the vascular anatomy. Carotid stenosis measurements (when applicable) are obtained utilizing NASCET  criteria, using the distal internal carotid diameter as the denominator. CONTRAST:  80mL OMNIPAQUE IOHEXOL 350 MG/ML SOLN COMPARISON:  None. FINDINGS: CTA NECK Aortic arch: Great vessel origins are patent. Right carotid system: Patent. Mild calcified plaque along the proximal ICA causing minor stenosis. Left carotid system: Patent. Mild calcified plaque along the proximal ICA causing minor stenosis. Vertebral arteries: Patent and codominant. There is mild plaque at the origins. Skeleton: Degenerative changes of the cervical spine. Other neck: No mass or adenopathy. Upper chest: No apical lung mass. Review of the MIP images confirms the above findings CTA HEAD There is no abnormal vascularity in the region of parenchymal hemorrhage. Anterior circulation: Intracranial internal carotid arteries are patent with calcified plaque causing mild to moderate stenosis. Anterior cerebral arteries are patent with an anterior communicating artery present. Middle cerebral arteries are patent. Posterior circulation: Intracranial vertebral arteries, basilar artery, and posterior cerebral arteries are patent. Venous sinuses: Patent as allowed by contrast bolus timing. Review of the MIP images confirms the above findings IMPRESSION: No abnormal vascularity in the region of intracranial hemorrhage. No hemodynamically significant stenosis. Electronically Signed   By: Guadlupe SpanishPraneil  Patel M.D.   On:  05/07/2020 08:11   CT ANGIO NECK W OR WO CONTRAST  Result Date: 05/07/2020 CLINICAL DATA:  Parenchymal hemorrhage EXAM: CT ANGIOGRAPHY HEAD AND NECK TECHNIQUE: Multidetector CT imaging of the head and neck was performed using the standard protocol during bolus administration of intravenous contrast. Multiplanar CT image reconstructions and MIPs were obtained to evaluate the vascular anatomy. Carotid stenosis measurements (when applicable) are obtained utilizing NASCET criteria, using the distal internal carotid diameter as the denominator.  CONTRAST:  69mL OMNIPAQUE IOHEXOL 350 MG/ML SOLN COMPARISON:  None. FINDINGS: CTA NECK Aortic arch: Great vessel origins are patent. Right carotid system: Patent. Mild calcified plaque along the proximal ICA causing minor stenosis. Left carotid system: Patent. Mild calcified plaque along the proximal ICA causing minor stenosis. Vertebral arteries: Patent and codominant. There is mild plaque at the origins. Skeleton: Degenerative changes of the cervical spine. Other neck: No mass or adenopathy. Upper chest: No apical lung mass. Review of the MIP images confirms the above findings CTA HEAD There is no abnormal vascularity in the region of parenchymal hemorrhage. Anterior circulation: Intracranial internal carotid arteries are patent with calcified plaque causing mild to moderate stenosis. Anterior cerebral arteries are patent with an anterior communicating artery present. Middle cerebral arteries are patent. Posterior circulation: Intracranial vertebral arteries, basilar artery, and posterior cerebral arteries are patent. Venous sinuses: Patent as allowed by contrast bolus timing. Review of the MIP images confirms the above findings IMPRESSION: No abnormal vascularity in the region of intracranial hemorrhage. No hemodynamically significant stenosis. Electronically Signed   By: Guadlupe Spanish M.D.   On: 05/07/2020 08:11   ECHOCARDIOGRAM COMPLETE  Result Date: 05/06/2020    ECHOCARDIOGRAM REPORT   Patient Name:   PEIRCE DEVENEY Date of Exam: 05/06/2020 Medical Rec #:  [id number]    Height:       66.0 in Accession #:    [id number]   Weight:       144.4 lb Date of Birth:  1955-09-08    BSA:          1.741 m Patient Age:    64 years     BP:           119/60 mmHg Patient Gender: M            HR:           88 bpm. Exam Location:  Inpatient Procedure: 2D Echo Indications:    Stroke I163.9  History:        Patient has no prior history of Echocardiogram examinations.                 Risk Factors:Current Smoker and Alcohol  Abuse.  Sonographer:    Thurman Coyer RDCS (AE) Referring Phys: [phone] ERIC LINDZEN IMPRESSIONS  1. Left ventricular ejection fraction, by estimation, is 60 to 65%. The left ventricle has normal function. The left ventricle has no regional wall motion abnormalities. Left ventricular diastolic parameters are consistent with Grade I diastolic dysfunction (impaired relaxation).  2. Right ventricular systolic function is normal. The right ventricular size is normal. Tricuspid regurgitation signal is inadequate for assessing PA pressure.  3. The mitral valve is normal in structure. No evidence of mitral valve regurgitation. No evidence of mitral stenosis.  4. The aortic valve is tricuspid. Aortic valve regurgitation is not visualized. No aortic stenosis is present.  5. The inferior vena cava is normal in size with greater than 50% respiratory variability, suggesting right atrial pressure of 3 mmHg. FINDINGS  Left Ventricle: Left  ventricular ejection fraction, by estimation, is 60 to 65%. The left ventricle has normal function. The left ventricle has no regional wall motion abnormalities. The left ventricular internal cavity size was normal in size. There is  no left ventricular hypertrophy. Left ventricular diastolic parameters are consistent with Grade I diastolic dysfunction (impaired relaxation). Right Ventricle: The right ventricular size is normal. No increase in right ventricular wall thickness. Right ventricular systolic function is normal. Tricuspid regurgitation signal is inadequate for assessing PA pressure. Left Atrium: Left atrial size was normal in size. Right Atrium: Right atrial size was normal in size. Pericardium: There is no evidence of pericardial effusion. Mitral Valve: The mitral valve is normal in structure. No evidence of mitral valve regurgitation. No evidence of mitral valve stenosis. Tricuspid Valve: The tricuspid valve is normal in structure. Tricuspid valve regurgitation is not demonstrated.  Aortic Valve: The aortic valve is tricuspid. Aortic valve regurgitation is not visualized. No aortic stenosis is present. Pulmonic Valve: The pulmonic valve was normal in structure. Pulmonic valve regurgitation is not visualized. Aorta: The aortic root is normal in size and structure. Venous: The inferior vena cava is normal in size with greater than 50% respiratory variability, suggesting right atrial pressure of 3 mmHg. IAS/Shunts: No atrial level shunt detected by color flow Doppler.  LEFT VENTRICLE PLAX 2D LVIDd:         4.30 cm  Diastology LVIDs:         2.70 cm  LV e' medial:    8.49 cm/s LV PW:         1.00 cm  LV E/e' medial:  9.2 LV IVS:        0.90 cm  LV e' lateral:   7.46 cm/s LVOT diam:     2.20 cm  LV E/e' lateral: 10.4 LV SV:         98 LV SV Index:   57 LVOT Area:     3.80 cm  RIGHT VENTRICLE RV S prime:     13.90 cm/s TAPSE (M-mode): 2.0 cm LEFT ATRIUM             Index       RIGHT ATRIUM           Index LA diam:        3.10 cm 1.78 cm/m  RA Area:     11.60 cm LA Vol (A2C):   40.9 ml 23.49 ml/m RA Volume:   24.60 ml  14.13 ml/m LA Vol (A4C):   37.9 ml 21.77 ml/m LA Biplane Vol: 43.0 ml 24.69 ml/m  AORTIC VALVE LVOT Vmax:   120.00 cm/s LVOT Vmean:  75.400 cm/s LVOT VTI:    0.259 m  AORTA Ao Root diam: 3.20 cm MITRAL VALVE MV Area (PHT): 2.80 cm    SHUNTS MV Decel Time: 271 msec    Systemic VTI:  0.26 m MV E velocity: 77.70 cm/s  Systemic Diam: 2.20 cm MV A velocity: 84.50 cm/s MV E/A ratio:  0.92 Marca Ancona MD Electronically signed by Marca Ancona MD Signature Date/Time: 05/06/2020/3:17:42 PM    Final     PHYSICAL EXAM   Temp:  [98.2 F (36.8 C)-99.1 F (37.3 C)] 98.2 F (36.8 C) (10/08 0400) Pulse Rate:  [58-97] 76 (10/08 0830) Resp:  [13-26] 20 (10/08 0830) BP: (115-175)/(51-108) 160/65 (10/08 0830) SpO2:  [94 %-100 %] 98 % (10/08 0830)  General - Well nourished, well developed, in no apparent distress.  Ophthalmologic - fundi not visualized due to  noncooperation.  Cardiovascular - Regular rhythm and rate.  Neuro - drowsy sleepy but open eyes on voice. Global aphasia, able to make a few sounds but not words. Only followed commands of "eye opening" and "eye closing", no other commands. Not naming or repeating. Left gaze preference, abut able to cross midline. Right HH, not blinking to visual threat. PERRL, right upper and lower facial weakness. Tongue protrusion not cooperative. LUE at least 4/5 and LLE at least 3/5. However, RUE 0/5 mild withdraw to pain. RLE no spontaneous movement but withdraw to pain 3-/5. DTR 1+ and right positive babinski. Sensation, coordination not cooperative and gait not tested.   ASSESSMENT/PLAN Mr. Dominico Rod is a 64 y.o. male with history of alcohol and tobacco abuse presenting with RUE and RLE weakness, R sensory loss and mixed aphasia. SBP > 200. CT w/ L thalamocapsular IPH w/ IVH. Neuro worsening in ED w/ IVH and hydrocephalus. NS consulted for EVD consideration, which was deferred.   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 Small vessel disease. Atrophy.   CT head 10/6 2042 enlarging L thalamocapsular IPH w/ increasing IVH from L ventricle into 3rd and 4th ventricles.  CT head 10/7 0510 stable L thalamic IPH w/ IVH w/ clearing of blood in 4th ventricle.   CTA head and neck 10/8 unremarkable angio, slight increase in midline shift and ventriculomegaly  MRI not able to perform due to no family contact to clear for metal  Repeat CT head 10/9 pending   2D Echo EF 60-65%  LDL 80  HgbA1c 4.6  UDS positive for cocaine  VTE prophylaxis - SCDs   No antithrombotic prior to admission, now on No antithrombotic given IPH  Therapy recommendations:  pending - ok OOB  Disposition:  pending   Keep in ICU today, consider xfer tomorrow  Cerebral Edema  CT head 10/6 2042  enlarging L thalamocapsular IPH w/ increasing IVH from L ventricle into 3rd and 4th ventricles.  Neurosurgery consulted Yetta Barre) no indication for EVD. Following.  CT head 10/7 0510 stable L thalamic IPH w/ IVH w/ clearing of blood in 4th ventricle.    CTA head and neck 10/8 unremarkable angio, slight increase in midline shift and ventriculomegaly  CT head repeat 10/9 pending   Hypertensive Emergency  Per EMS, SBP > 200   Home meds:  none  Put on Cleviprex for BP control -> now off  Hydralazine IV PRN  On norvasc 10 and lisinopril 20 bid  SBP goal < 160 . Long-term BP goal normotensive  Leukocytosis and fever  WBC 8.8->15.9->16.2  Tmax afebrile -> 101.3  UA neg  CXR mild cardiomegaly w/ mild central congestion, suspicion mild interstitial edema   Blood culture pending  Hyperlipidemia  Home meds:  None  LDL 80, goal < 70  No statin in setting of IPH  Mild transaminitis, likely related to alcohol, AST/ALP 68/64->52/47  Consider statin low dose on discharge with normalized liver enzymes  Dysphagia . Secondary to stroke . NPO . On IVF @ 30 . cortrak placed . On TF @ 65 . Speech on board   Tobacco abuse  Current smoker  Smoking cessation counseling will be provided  Cocaine abuse  UDS positive for cocaine  Cocaine cessation will be provided  Avoid beta-blocker  Other Stroke Risk Factors  ETOH use, alcohol level <10, advised  to drink no more than 2 drink(s) a day  Other Active Problems    Hospital day # 2  This patient is critically ill due to fever and leukocytosis, dysphagia, hypertensive emergency, left BG ICH and IVH and at significant risk of neurological worsening, death form cerebral edema, brain herniation, obstructive hydrocephalus, sepsis, seizure, hypertensive encephalopathy. This patient's care requires constant monitoring of vital signs, hemodynamics, respiratory and cardiac monitoring, review of multiple databases, neurological  assessment, discussion with family, other specialists and medical decision making of high complexity. I spent 35 minutes of neurocritical care time in the care of this patient.   Marvel Plan, MD PhD Stroke Neurology 05/07/2020 8:53 AM    To contact Stroke Continuity provider, please refer to [email or web address]. After hours, contact General Neurology

## 2020-05-07 NOTE — Consult Note (Signed)
Physical Medicine and Rehabilitation Consult Reason for Consult: Right side weakness with speech deficits Referring Physician: Dr.Xu  HPI: Alan Everett is a 64 y.o. right-handed male with history of tobacco, alcohol use on no prescription medications. History taken from chart review due to lethargy/cognition. He presented on 05/05/20 with right hemiparesis and aphasia. CT of the head showed a 2.3 x 2.1 x 2.9 cm acute intraparenchymal hemorrhage, centered at the left thalamic capsular region.  Minimal surrounding vasogenic edema without significant regional mass-effect or midline shift. Associated intraventricular extension with small volume intraventricular blood within the adjacent left lateral ventricle.  No hydrocephalus or ventricular trapping.  CT angiogram of head and neck no signifcant stenosis. Echocardiogram ejection fraction of 60-65%, no wall motion abnormality. Grade 1 diastolic dysfunction.  Admission chemistries glucose 101, AST 68, ALT 64, hemoglobin 12.4, platelets of 6, alcohol negative, urine drug screen positive cocaine.  Placed on Cleviprex for blood pressure control.  Patient is currently NPO awaiting swallow study.  Follow-up cranial CT scan pending 05/08/2020.  Therapy evaluations completed with recommendations of physical medicine rehab consult.  Review of Systems  Unable to perform ROS: Acuity of condition   Past Medical History:  Diagnosis Date  . Alcohol use   . Tobacco abuse    Past Surgical History:  Procedure Laterality Date  . COLONOSCOPY N/A 03/01/2020   Procedure: COLONOSCOPY;  Surgeon: Toledo, Boykin Nearing, MD;  Location: [facility] ENDOSCOPY;  Service: Gastroenterology;  Laterality: N/A;  . ESOPHAGOGASTRODUODENOSCOPY N/A 03/01/2020   Procedure: ESOPHAGOGASTRODUODENOSCOPY (EGD);  Surgeon: Toledo, Boykin Nearing, MD;  Location: [facility] ENDOSCOPY;  Service: Gastroenterology;  Laterality: N/A;  . FOOT FRACTURE SURGERY Left    Family History  Problem Relation Age of Onset  .  Cancer Mother        his mother died of cancer, but patient is not sure which type of cancner.   . Diabetes Mellitus II Niece    Social History:  reports that he has been smoking. He has never used smokeless tobacco. He reports current alcohol use of about 6.0 standard drinks of alcohol per week. He reports that he does not use drugs. Allergies: No Known Allergies Medications Prior to Admission  Medication Sig Dispense Refill  . acetaminophen (TYLENOL) 500 MG tablet Take 500-1,000 mg by mouth every 6 (six) hours as needed for mild pain, moderate pain or fever. (Patient not taking: Reported on 03/10/2020)    . docusate sodium (STOOL SOFTENER) 100 MG capsule Take 100 mg by mouth 2 (two) times daily.    . Iron, Ferrous Sulfate, 325 (65 Fe) MG TABS Take 325 mg by mouth daily. 30 tablet 2    Home:    Functional History: Prior Function Comments: no family present, patient unable to state prior history, but was indpendent per chart Functional Status:  Mobility: Bed Mobility Overal bed mobility: Needs Assistance Bed Mobility: Rolling, Sidelying to Sit Rolling: Mod assist Sidelying to sit: HOB elevated, Max assist, +2 for physical assistance General bed mobility comments: rolling to R pulling with L UE and assist for legs off bed, and to lift trunk upright Transfers Overall transfer level: Needs assistance Transfers: Lateral/Scoot Transfers  Lateral/Scoot Transfers: +2 physical assistance, Max assist General transfer comment: scooting up toward [facility] several scoots cues and assist for anterior R weight shift and used pad under hips to scoot toward HOB      ADL:    Cognition: Cognition Overall Cognitive Status: Difficult to assess Arousal/Alertness: Lethargic Orientation Level: Disoriented X4  Attention: Focused Focused Attention: Impaired Focused Attention Impairment: Verbal basic, Functional basic Cognition Arousal/Alertness: Lethargic Behavior During Therapy: Flat affect Overall  Cognitive Status: Difficult to assess Difficult to assess due to: Impaired communication  Blood pressure (!) 151/56, pulse 77, temperature 99.8 F (37.7 C), temperature source Axillary, resp. rate (!) 21, weight 65.5 kg, SpO2 98 %. Physical Exam Vitals reviewed.  Constitutional:      General: He is not in acute distress.    Appearance: He is normal weight.     Comments: Extremely lethargic  HENT:     Head: Normocephalic and atraumatic.     Comments: + NG    Right Ear: External ear normal.     Left Ear: External ear normal.     Nose: Nose normal.  Eyes:     General:        Right eye: No discharge.        Left eye: No discharge.  Cardiovascular:     Rate and Rhythm: Normal rate and regular rhythm.  Pulmonary:     Effort: Pulmonary effort is normal. No respiratory distress.     Breath sounds: No stridor.  Abdominal:     General: Abdomen is flat. There is no distension.  Musculoskeletal:     Cervical back: Normal range of motion and neck supple.     Comments: No edema or tenderness in extremities  Skin:    General: Skin is warm and dry.  Neurological:     Comments: Extremely lethargic  Globally aphasic per report.   Exam limited due to lethargy, but withdraws to pain on left, no movement noted on right ? Increased tone in left upper extremity  Psychiatric:     Comments: Unable to assess due to lethargy     Results for orders placed or performed during the hospital encounter of 05/05/20 (from the past 24 hour(s))  CBC     Status: Abnormal   Collection Time: 05/07/20  3:37 AM  Result Value Ref Range   WBC 16.2 (H) 4.0 - 10.5 K/uL   RBC 4.16 (L) 4.22 - 5.81 MIL/uL   Hemoglobin 12.0 (L) 13.0 - 17.0 g/dL   HCT 16.1 (L) 39 - 52 %   MCV 92.1 80.0 - 100.0 fL   MCH 28.8 26.0 - 34.0 pg   MCHC 31.3 30.0 - 36.0 g/dL   RDW 09.6 (H) 04.5 - 40.9 %   Platelets 136 (L) 150 - 400 K/uL   nRBC 0.0 0.0 - 0.2 %  Basic metabolic panel     Status: Abnormal   Collection Time: 05/07/20   3:37 AM  Result Value Ref Range   Sodium 136 135 - 145 mmol/L   Potassium 3.4 (L) 3.5 - 5.1 mmol/L   Chloride 104 98 - 111 mmol/L   CO2 21 (L) 22 - 32 mmol/L   Glucose, Bld 166 (H) 70 - 99 mg/dL   BUN 9 8 - 23 mg/dL   Creatinine, Ser 8.11 0.61 - 1.24 mg/dL   Calcium 9.5 8.9 - 91.4 mg/dL   GFR calc non Af Amer >60 >60 mL/min   Anion gap 11 5 - 15  Magnesium     Status: Abnormal   Collection Time: 05/07/20 10:12 AM  Result Value Ref Range   Magnesium 1.6 (L) 1.7 - 2.4 mg/dL  Phosphorus     Status: None   Collection Time: 05/07/20 10:12 AM  Result Value Ref Range   Phosphorus 2.6 2.5 - 4.6 mg/dL   CT  ANGIO HEAD W OR WO CONTRAST  Result Date: 05/07/2020 CLINICAL DATA:  Parenchymal hemorrhage EXAM: CT ANGIOGRAPHY HEAD AND NECK TECHNIQUE: Multidetector CT imaging of the head and neck was performed using the standard protocol during bolus administration of intravenous contrast. Multiplanar CT image reconstructions and MIPs were obtained to evaluate the vascular anatomy. Carotid stenosis measurements (when applicable) are obtained utilizing NASCET criteria, using the distal internal carotid diameter as the denominator. CONTRAST:  30mL OMNIPAQUE IOHEXOL 350 MG/ML SOLN COMPARISON:  None. FINDINGS: CTA NECK Aortic arch: Great vessel origins are patent. Right carotid system: Patent. Mild calcified plaque along the proximal ICA causing minor stenosis. Left carotid system: Patent. Mild calcified plaque along the proximal ICA causing minor stenosis. Vertebral arteries: Patent and codominant. There is mild plaque at the origins. Skeleton: Degenerative changes of the cervical spine. Other neck: No mass or adenopathy. Upper chest: No apical lung mass. Review of the MIP images confirms the above findings CTA HEAD There is no abnormal vascularity in the region of parenchymal hemorrhage. Anterior circulation: Intracranial internal carotid arteries are patent with calcified plaque causing mild to moderate stenosis.  Anterior cerebral arteries are patent with an anterior communicating artery present. Middle cerebral arteries are patent. Posterior circulation: Intracranial vertebral arteries, basilar artery, and posterior cerebral arteries are patent. Venous sinuses: Patent as allowed by contrast bolus timing. Review of the MIP images confirms the above findings IMPRESSION: No abnormal vascularity in the region of intracranial hemorrhage. No hemodynamically significant stenosis. Electronically Signed   By: Guadlupe Spanish M.D.   On: 05/07/2020 08:11   CT HEAD WO CONTRAST  Result Date: 05/06/2020 CLINICAL DATA:  Follow-up parenchymal hemorrhage EXAM: CT HEAD WITHOUT CONTRAST TECHNIQUE: Contiguous axial images were obtained from the base of the skull through the vertex without intravenous contrast. COMPARISON:  Yesterday FINDINGS: Brain: Left thalamic hematoma with extensive intraventricular extension into the left more than right lateral ventricles and third ventricle. There has been some clearing of blood from the fourth ventricle. No significant change in ventricular volume. The thalamic component (which also extends into adjacent white matter tracks) measures up to 3 x 3.4 x 3.7 cm. Edema has developed around the hematoma without change in local mass effect. No visible cortex infarct. Vascular: No hyperdense vessel or unexpected calcification. Skull: Normal. Negative for fracture or focal lesion. Sinuses/Orbits: No acute finding. IMPRESSION: Size stable left thalamic hematoma with intraventricular extension. There has been some clearing of blood from the fourth ventricle; the ventricular volume is essentially stable. Electronically Signed   By: Marnee Spring M.D.   On: 05/06/2020 05:10   CT Head Wo Contrast  Result Date: 05/05/2020 CLINICAL DATA:  Follow-up intracranial hemorrhage EXAM: CT HEAD WITHOUT CONTRAST TECHNIQUE: Contiguous axial images were obtained from the base of the skull through the vertex without  intravenous contrast. COMPARISON:  Earlier same day FINDINGS: Brain: Intraparenchymal hemorrhage in the region of the left thalamus previously measuring about 2.5 cm in diameter now measures almost 3.5 cm in diameter. There has been worsening of intraventricular penetration with blood now filling the left lateral ventricle and extending into the third and fourth ventricles. There is now mass effect with left-to-right midline shift of 7-8 mm. Vascular: No new vascular finding. Skull: Normal Sinuses/Orbits: Clear/normal Other: None IMPRESSION: 1. Intraparenchymal hemorrhage in the region of the left thalamus previously measuring about 2.5 cm in diameter now measures almost 4 cm in diameter. There has been worsening of intraventricular penetration with blood now filling the left lateral ventricle and extending  into the third and fourth ventricles. 2. These results were called by telephone at the time of interpretation on 05/05/2020 at 8:41 pm to provider [facility] , who verbally acknowledged these results. Electronically Signed   By: Paulina Fusi M.D.   On: 05/05/2020 20:42   CT ANGIO NECK W OR WO CONTRAST  Result Date: 05/07/2020 CLINICAL DATA:  Parenchymal hemorrhage EXAM: CT ANGIOGRAPHY HEAD AND NECK TECHNIQUE: Multidetector CT imaging of the head and neck was performed using the standard protocol during bolus administration of intravenous contrast. Multiplanar CT image reconstructions and MIPs were obtained to evaluate the vascular anatomy. Carotid stenosis measurements (when applicable) are obtained utilizing NASCET criteria, using the distal internal carotid diameter as the denominator. CONTRAST:  80mL OMNIPAQUE IOHEXOL 350 MG/ML SOLN COMPARISON:  None. FINDINGS: CTA NECK Aortic arch: Great vessel origins are patent. Right carotid system: Patent. Mild calcified plaque along the proximal ICA causing minor stenosis. Left carotid system: Patent. Mild calcified plaque along the proximal ICA causing minor  stenosis. Vertebral arteries: Patent and codominant. There is mild plaque at the origins. Skeleton: Degenerative changes of the cervical spine. Other neck: No mass or adenopathy. Upper chest: No apical lung mass. Review of the MIP images confirms the above findings CTA HEAD There is no abnormal vascularity in the region of parenchymal hemorrhage. Anterior circulation: Intracranial internal carotid arteries are patent with calcified plaque causing mild to moderate stenosis. Anterior cerebral arteries are patent with an anterior communicating artery present. Middle cerebral arteries are patent. Posterior circulation: Intracranial vertebral arteries, basilar artery, and posterior cerebral arteries are patent. Venous sinuses: Patent as allowed by contrast bolus timing. Review of the MIP images confirms the above findings IMPRESSION: No abnormal vascularity in the region of intracranial hemorrhage. No hemodynamically significant stenosis. Electronically Signed   By: Guadlupe Spanish M.D.   On: 05/07/2020 08:11   DG Chest Port 1 View  Result Date: 05/05/2020 CLINICAL DATA:  Intracerebral hemorrhage EXAM: PORTABLE CHEST 1 VIEW COMPARISON:  02/28/2020 FINDINGS: No focal opacity or pleural effusion. Borderline cardiomegaly with mild central congestion and suspicion of mild interstitial edema. No pneumothorax. IMPRESSION: Borderline cardiomegaly with mild central congestion and suspicion of mild interstitial edema. Electronically Signed   By: Jasmine Pang M.D.   On: 05/05/2020 20:09   ECHOCARDIOGRAM COMPLETE  Result Date: 05/06/2020    ECHOCARDIOGRAM REPORT   Patient Name:   FINAS DELONE Date of Exam: 05/06/2020 Medical Rec #:  [id number]    Height:       66.0 in Accession #:    [id number]   Weight:       144.4 lb Date of Birth:  October 30, 1955    BSA:          1.741 m Patient Age:    64 years     BP:           119/60 mmHg Patient Gender: M            HR:           88 bpm. Exam Location:  Inpatient Procedure: 2D Echo  Indications:    Stroke I163.9  History:        Patient has no prior history of Echocardiogram examinations.                 Risk Factors:Current Smoker and Alcohol Abuse.  Sonographer:    Thurman Coyer RDCS (AE) Referring Phys: [phone] ERIC LINDZEN IMPRESSIONS  1. Left ventricular ejection fraction, by estimation, is 60 to 65%.  The left ventricle has normal function. The left ventricle has no regional wall motion abnormalities. Left ventricular diastolic parameters are consistent with Grade I diastolic dysfunction (impaired relaxation).  2. Right ventricular systolic function is normal. The right ventricular size is normal. Tricuspid regurgitation signal is inadequate for assessing PA pressure.  3. The mitral valve is normal in structure. No evidence of mitral valve regurgitation. No evidence of mitral stenosis.  4. The aortic valve is tricuspid. Aortic valve regurgitation is not visualized. No aortic stenosis is present.  5. The inferior vena cava is normal in size with greater than 50% respiratory variability, suggesting right atrial pressure of 3 mmHg. FINDINGS  Left Ventricle: Left ventricular ejection fraction, by estimation, is 60 to 65%. The left ventricle has normal function. The left ventricle has no regional wall motion abnormalities. The left ventricular internal cavity size was normal in size. There is  no left ventricular hypertrophy. Left ventricular diastolic parameters are consistent with Grade I diastolic dysfunction (impaired relaxation). Right Ventricle: The right ventricular size is normal. No increase in right ventricular wall thickness. Right ventricular systolic function is normal. Tricuspid regurgitation signal is inadequate for assessing PA pressure. Left Atrium: Left atrial size was normal in size. Right Atrium: Right atrial size was normal in size. Pericardium: There is no evidence of pericardial effusion. Mitral Valve: The mitral valve is normal in structure. No evidence of mitral valve  regurgitation. No evidence of mitral valve stenosis. Tricuspid Valve: The tricuspid valve is normal in structure. Tricuspid valve regurgitation is not demonstrated. Aortic Valve: The aortic valve is tricuspid. Aortic valve regurgitation is not visualized. No aortic stenosis is present. Pulmonic Valve: The pulmonic valve was normal in structure. Pulmonic valve regurgitation is not visualized. Aorta: The aortic root is normal in size and structure. Venous: The inferior vena cava is normal in size with greater than 50% respiratory variability, suggesting right atrial pressure of 3 mmHg. IAS/Shunts: No atrial level shunt detected by color flow Doppler.  LEFT VENTRICLE PLAX 2D LVIDd:         4.30 cm  Diastology LVIDs:         2.70 cm  LV e' medial:    8.49 cm/s LV PW:         1.00 cm  LV E/e' medial:  9.2 LV IVS:        0.90 cm  LV e' lateral:   7.46 cm/s LVOT diam:     2.20 cm  LV E/e' lateral: 10.4 LV SV:         98 LV SV Index:   57 LVOT Area:     3.80 cm  RIGHT VENTRICLE RV S prime:     13.90 cm/s TAPSE (M-mode): 2.0 cm LEFT ATRIUM             Index       RIGHT ATRIUM           Index LA diam:        3.10 cm 1.78 cm/m  RA Area:     11.60 cm LA Vol (A2C):   40.9 ml 23.49 ml/m RA Volume:   24.60 ml  14.13 ml/m LA Vol (A4C):   37.9 ml 21.77 ml/m LA Biplane Vol: 43.0 ml 24.69 ml/m  AORTIC VALVE LVOT Vmax:   120.00 cm/s LVOT Vmean:  75.400 cm/s LVOT VTI:    0.259 m  AORTA Ao Root diam: 3.20 cm MITRAL VALVE MV Area (PHT): 2.80 cm    SHUNTS MV Decel Time:  271 msec    Systemic VTI:  0.26 m MV E velocity: 77.70 cm/s  Systemic Diam: 2.20 cm MV A velocity: 84.50 cm/s MV E/A ratio:  0.92 Marca Anconaalton Mclean MD Electronically signed by Marca Anconaalton Mclean MD Signature Date/Time: 05/06/2020/3:17:42 PM    Final    CT HEAD CODE STROKE WO CONTRAST  Result Date: 05/05/2020 CLINICAL DATA:  Code stroke. Initial evaluation for acute neuro deficit, stroke suspected. EXAM: CT HEAD WITHOUT CONTRAST TECHNIQUE: Contiguous axial images were  obtained from the base of the skull through the vertex without intravenous contrast. COMPARISON:  None. FINDINGS: Brain: 2.3 x 2.1 x 2.9 cm acute intraparenchymal hemorrhage centered at the left thalamic capsular region (series 3, image 16, estimated volume 7 cc). Minimal surrounding vasogenic edema without significant regional mass effect or midline shift. Associated intraventricular extension with trace intraventricular blood seen within the adjacent left lateral ventricle. No hydrocephalus or ventricular trapping. No other acute intracranial hemorrhage. No other acute large vessel territory infarct. Underlying age-related cerebral atrophy with mild chronic small vessel ischemic disease. No extra-axial fluid collection. Vascular: No hyperdense vessel. Skull: Scalp soft tissues and calvarium within normal limits. Sinuses/Orbits: Globes and orbital soft tissues demonstrate no acute finding. Mild scattered mucosal thickening noted within the ethmoidal air cells. Paranasal sinuses mastoid air cells are otherwise clear. Other: None. ASPECTS [facility]([place] Stroke Program Early CT Score) Does not apply due to ICH. IMPRESSION: 1. 2.3 x 2.1 x 2.9 cm acute intraparenchymal hemorrhage centered at the left thalamic capsular region, estimated volume 7 cc. Minimal surrounding vasogenic edema without significant regional mass effect or midline shift. Associated intraventricular extension with small volume intraventricular blood within the adjacent left lateral ventricle. No hydrocephalus or ventricular trapping. 2. No other acute intracranial abnormality. 3. Underlying age-related cerebral atrophy with mild chronic small vessel ischemic disease. These results were communicated to Dr. Otelia LimesLindzen at 6:43 pmon 10/6/2021by text page via the [facility] system. Electronically Signed   By: Rise MuBenjamin  McClintock M.D.   On: 05/05/2020 18:44    Assessment/Plan: Diagnosis: Acute intraparenchymal hemorrhage, centered at the left thalamic  capsular region.   Stroke: Continue secondary stroke prophylaxis and Risk Factor Modification listed below:   Blood Pressure Management:  Continue current medication with prn's with permisive HTN per primary team Tobacco abuse: Counseled on appropriate Right sided hemiparesis: fit for orthosis to prevent contractures (resting hand splint for day, wrist cock up splint at night, PRAFO, etc) Motor recovery: Fluoxetine Labs independently reviewed.  Records reviewed and summated above.  1. Does the need for close, 24 hr/day medical supervision in concert with the patient's rehab needs make it unreasonable for this patient to be served in a less intensive setting? Yes  2. Co-Morbidities requiring supervision/potential complications: Polysubstance abuse (counsel when appropriate), post stroke dysphagia (advance as tolerated), SIRS (with fever, leukocytosis, repeat labs, cont to monitor for signs and symptoms of infection, further workup if indicated), hyperglycemia (likely stress-induced), hypokalemia (continue to monitor and replete as necessary), hypomagnesemia (repeat labs, supplement) 3. Due to bladder management, bowel management, safety, skin/wound care, disease management, medication administration, pain management and patient education, does the patient require 24 hr/day rehab nursing? Yes 4. Does the patient require coordinated care of a physician, rehab nurse, therapy disciplines of PT/OT/SLP to address physical and functional deficits in the context of the above medical diagnosis(es)? Yes Addressing deficits in the following areas: balance, endurance, locomotion, strength, transferring, bowel/bladder control, bathing, dressing, feeding, grooming, toileting, cognition, speech, language, swallowing and psychosocial support 5. Can the patient  actively participate in an intensive therapy program of at least 3 hrs of therapy per day at least 5 days per week? Potentially 6. The potential for patient to  make measurable gains while on inpatient rehab is excellent 7. Anticipated functional outcomes upon discharge from inpatient rehab are min assist and mod assist  with PT, min assist and mod assist with OT, min assist with SLP. 8. Estimated rehab length of stay to reach the above functional goals is: 24-28 days. 9. Anticipated discharge destination: TBD. 10. Overall Rehab/Functional Prognosis: good  RECOMMENDATIONS: This patient's condition is appropriate for continued rehabilitative care in the following setting: Likely CIR.  Will need to await pt's ability to participate and ensure caregiver support at discharge.  Patient has agreed to participate in recommended program. Potentially Note that insurance prior authorization may be required for reimbursement for recommended care.  Comment: Rehab Admissions Coordinator to follow up.  I have personally performed a face to face diagnostic evaluation, including, but not limited to relevant history and physical exam findings, of this patient and developed relevant assessment and plan.  Additionally, I have reviewed and concur with the physician assistant's documentation above.   Maryla Morrow, MD, ABPMR Mcarthur Rossetti Angiulli, PA-C 05/07/2020

## 2020-05-07 NOTE — Evaluation (Signed)
Speech Language Pathology Evaluation Patient Details Name: Alan Everett MRN: [id number] DOB: 12-23-1955 Today's Date: 05/07/2020 Time: 9924-2683 SLP Time Calculation (min) (ACUTE ONLY): 10 min  Problem List:  Patient Active Problem List   Diagnosis Date Noted  . ICH (intracerebral hemorrhage) (HCC) 05/05/2020  . Iron deficiency 02/29/2020  . Symptomatic anemia 02/28/2020  . Microcytic anemia 02/28/2020  . Alcohol use 02/28/2020  . Tobacco abuse    Past Medical History:  Past Medical History:  Diagnosis Date  . Alcohol use   . Tobacco abuse    Past Surgical History:  Past Surgical History:  Procedure Laterality Date  . COLONOSCOPY N/A 03/01/2020   Procedure: COLONOSCOPY;  Surgeon: Toledo, Boykin Nearing, MD;  Location: [facility] ENDOSCOPY;  Service: Gastroenterology;  Laterality: N/A;  . ESOPHAGOGASTRODUODENOSCOPY N/A 03/01/2020   Procedure: ESOPHAGOGASTRODUODENOSCOPY (EGD);  Surgeon: Toledo, Boykin Nearing, MD;  Location: [facility] ENDOSCOPY;  Service: Gastroenterology;  Laterality: N/A;  . FOOT FRACTURE SURGERY Left    HPI:  Alan Everett is a 64 y.o. male with a PMHx of alcohol use and tobacco abuse who presents acutely to the ED via EMS as a Code Stroke for acute onset of right sided weakness. Patient with disoriented, right leg weakness, right limb ataxia, right decreased sensation and Global aphasia on exam. 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 Worsening of intraventricular   Assessment / Plan / Recommendation Clinical Impression  Pt presents with a significant receptive and expressive aphasia. He wiped his mouth x1 when prompted with contextual and visual cues, otherwise not following commands. Expressively he said "yeah" and "mhmm" x1 each, also vocalizing on less than 25% of trials in response to yes/no questions (but not actually responding to the question). He hold his gaze to his L but turned his head to the R when given a stimulus on that side. He is still drowsy, and this  also limits his focused attention. He will benefit from intensive SLP follow up.     SLP Assessment  SLP Recommendation/Assessment: Patient needs continued Speech Lanaguage Pathology Services SLP Visit Diagnosis: Aphasia (R47.01)    Follow Up Recommendations   (tba)    Frequency and Duration min 2x/week  2 weeks      SLP Evaluation Cognition  Overall Cognitive Status: Difficult to assess (aphasia) Arousal/Alertness: Lethargic Orientation Level: Disoriented X4 Attention: Focused Focused Attention: Impaired Focused Attention Impairment: Verbal basic;Functional basic       Comprehension  Auditory Comprehension Overall Auditory Comprehension: Impaired Yes/No Questions: Impaired Basic Biographical Questions: 0-25% accurate Commands: Impaired One Step Basic Commands: 0-24% accurate    Expression Expression Primary Mode of Expression: Verbal Verbal Expression Overall Verbal Expression: Impaired Initiation: Impaired Automatic Speech:  ("yeah" and "mhmm" x1 each) Level of Generative/Spontaneous Verbalization: Word Repetition: Impaired Level of Impairment: Word level (and phoneme level) Naming: Impairment Confrontation: Impaired Non-Verbal Means of Communication: Not applicable   Oral / Motor  Motor Speech Overall Motor Speech:  (UTA)   GO                    Mahala Menghini., M.A. CCC-SLP Acute Rehabilitation Services Pager [phone] Office [phone]  05/07/2020, 10:02 AM

## 2020-05-07 NOTE — Progress Notes (Signed)
Initial Nutrition Assessment  DOCUMENTATION CODES:   Not applicable  INTERVENTION:   Tube Feeding via Cortrak: Osmolite 1.2 at 65 ml/hr Begin at 20 ml/hr; titrate by 10 mL q 4 hours until goal rate of 65 ml/hr Provides 87 g of protein, 1872 kcals and 1264 mL of free water Meets 100% estimated calorie and protein needs  NUTRITION DIAGNOSIS:   Inadequate oral intake related to dysphagia, acute illness as evidenced by NPO status.  GOAL:   Patient will meet greater than or equal to 90% of their needs  MONITOR:   TF tolerance, Diet advancement, Weight trends, Skin  REASON FOR ASSESSMENT:   Consult, New TF Enteral/tube feeding initiation and management  ASSESSMENT:   64 yo male presents with acute onset of right sided weakness and mixed recept and expressive aphasia and admitted with acute left thalamocapsular ICH, severe HTN. PMH includes EtOH and tobacco abuse  10/06 Admitted 10/08 Cortrak placed  NPO, Cortrak placed today  Current weight 65.5 kg; admit weight 65.7 kg. Per weight encounters, weight has been relatively stable. Unable to obtain diet and weight history from patient at this time  Labs: potassium 3.4 (L), magnesium 1.6 (L) Meds: ferrous sulfate, senokot  Diet Order:   Diet Order            Diet NPO time specified  Diet effective now                 EDUCATION NEEDS:   Not appropriate for education at this time  Skin:  Skin Assessment: Reviewed RN Assessment  Last BM:  10/6  Height:   Ht Readings from Last 1 Encounters:  04/09/20 5\' 6"  (1.676 m)    Weight:   Wt Readings from Last 1 Encounters:  05/06/20 65.5 kg    BMI:  Body mass index is 23.31 kg/m.  Estimated Nutritional Needs:   Kcal:  1780-1980 kcals  Protein:  85-95 g  Fluid:  >/= 1.8 L    07/06/20 MS, RDN, LDN, CNSC Registered Dietitian III Clinical Nutrition RD Pager and On-Call Pager Number Located in [place]

## 2020-05-07 NOTE — Progress Notes (Signed)
Rehab Admissions Coordinator Note:  Patient was screened by Clois Dupes for appropriateness for an Inpatient Acute Rehab Consult.  At this time, we are recommending Inpatient Rehab consult. I will place order per protocol.  Clois Dupes RN MSN 05/07/2020, 11:15 AM  I can be reached at [phone].

## 2020-05-07 NOTE — Progress Notes (Signed)
  Speech Language Pathology Treatment: Dysphagia  Patient Details Name: Alan Everett MRN: [id number] DOB: 1956/06/08 Today's Date: 05/07/2020 Time: 1017-5102 SLP Time Calculation (min) (ACUTE ONLY): 9 min  Assessment / Plan / Recommendation Clinical Impression  Pt is more alert today but still has trouble maintaining alertness and following simple commands. With constant stimulation he will accept POs though, improving further when pt is engaged in self-feeding. A lot of the thin liquids spill out of the R side of his mouth without demonstrating any awareness, but he does initiate a swallow with whatever may be left in his oral cavity. With purees he has good acceptance but no initiation of swallowing, requiring oral suction again. Would consider temporary, alternative means of nutrition for now.   HPI HPI: Alan Everett is a 64 y.o. male with a PMHx of alcohol use and tobacco abuse who presents acutely to the ED via EMS as a Code Stroke for acute onset of right sided weakness. Patient with disoriented, right leg weakness, right limb ataxia, right decreased sensation and Global aphasia on exam. 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 Worsening of intraventricular      SLP Plan  Continue with current plan of care       Recommendations  Diet recommendations: NPO Medication Administration: Via alternative means                Oral Care Recommendations: Oral care QID Follow up Recommendations:  (tba) SLP Visit Diagnosis: Dysphagia, unspecified (R13.10) Plan: Continue with current plan of care       GO                Mahala Menghini., M.A. CCC-SLP Acute Rehabilitation Services Pager [phone] Office [phone]  05/07/2020, 9:49 AM

## 2020-05-07 NOTE — Progress Notes (Signed)
OT Cancellation Note  Patient Details Name: Alan Everett MRN: [id number] DOB: 1956-06-17   Cancelled Treatment:    Reason Eval/Treat Not Completed: Active bedrest order. Will monitor for pt to come off bedrest and evaluate as appropriate and time allows.  Ignacia Palma, OTR/L Acute Rehab Services Pager [phone] Office [phone]     Evette Georges 05/07/2020, 8:03 AM

## 2020-05-07 NOTE — Procedures (Signed)
Cortrak  Tube Location:  Left nare Initial Placement:  Stomach Secured by: Bridle Technique Used to Measure Tube Placement:  Documented cm marking at nare/ corner of mouth Cortrak Secured At:  70 cm    Cortrak Tube Team Note:  Consult received to place a Cortrak feeding tube.   No x-ray is required. RN may begin using tube.   If the tube becomes dislodged please keep the tube and contact the Cortrak team at [email or web address] ([email or web address]) for replacement.  If after hours and replacement cannot be delayed, place a NG tube and confirm placement with an abdominal x-ray.   Betsey Holiday MS, RD, LDN Please refer to [facility] for RD and/or RD on-call/weekend/after hours pager

## 2020-05-08 ENCOUNTER — Inpatient Hospital Stay (HOSPITAL_COMMUNITY): Payer: Medicaid Other

## 2020-05-08 LAB — MAGNESIUM
Magnesium: 1.9 mg/dL (ref 1.7–2.4)
Magnesium: 1.9 mg/dL (ref 1.7–2.4)

## 2020-05-08 LAB — CBC
HCT: 37.9 % — ABNORMAL LOW (ref 39.0–52.0)
Hemoglobin: 11.9 g/dL — ABNORMAL LOW (ref 13.0–17.0)
MCH: 28.9 pg (ref 26.0–34.0)
MCHC: 31.4 g/dL (ref 30.0–36.0)
MCV: 92 fL (ref 80.0–100.0)
Platelets: 150 10*3/uL (ref 150–400)
RBC: 4.12 MIL/uL — ABNORMAL LOW (ref 4.22–5.81)
RDW: 20.8 % — ABNORMAL HIGH (ref 11.5–15.5)
WBC: 24.1 10*3/uL — ABNORMAL HIGH (ref 4.0–10.5)
nRBC: 0 % (ref 0.0–0.2)

## 2020-05-08 LAB — PHOSPHORUS
Phosphorus: 2 mg/dL — ABNORMAL LOW (ref 2.5–4.6)
Phosphorus: 1.5 mg/dL — ABNORMAL LOW (ref 2.5–4.6)

## 2020-05-08 LAB — BASIC METABOLIC PANEL
Anion gap: 11 (ref 5–15)
BUN: 16 mg/dL (ref 8–23)
CO2: 21 mmol/L — ABNORMAL LOW (ref 22–32)
Calcium: 9.3 mg/dL (ref 8.9–10.3)
Chloride: 106 mmol/L (ref 98–111)
Creatinine, Ser: 0.79 mg/dL (ref 0.61–1.24)
GFR, Estimated: >60 mL/min (ref >60)
Glucose, Bld: 139 mg/dL — ABNORMAL HIGH (ref 70–99)
Potassium: 3.1 mmol/L — ABNORMAL LOW (ref 3.5–5.1)
Sodium: 138 mmol/L (ref 135–145)

## 2020-05-08 LAB — GLUCOSE, CAPILLARY
Glucose-Capillary: 136 mg/dL — ABNORMAL HIGH (ref 70–99)
Glucose-Capillary: 143 mg/dL — ABNORMAL HIGH (ref 70–99)
Glucose-Capillary: 151 mg/dL — ABNORMAL HIGH (ref 70–99)
Glucose-Capillary: 157 mg/dL — ABNORMAL HIGH (ref 70–99)
Glucose-Capillary: 165 mg/dL — ABNORMAL HIGH (ref 70–99)

## 2020-05-08 MED ORDER — POTASSIUM CHLORIDE 20 MEQ/15ML (10%) PO SOLN
20.0000 meq | Freq: Two times a day (BID) | ORAL | Status: AC
  Administered 2020-05-08 (×2): 20 meq
  Filled 2020-05-08 (×2): qty 15

## 2020-05-08 NOTE — Progress Notes (Addendum)
Physical Therapy Progress Note  Clinical Impression: Pt was seen for mobility progression. He remained very sleepy and lethargic throughout, requiring multimodal stimuli to maintain eyes open for brief periods. He continues to required heavy physical assistance of two for any mobility and has great difficulty following any commands/directions. In sitting, he pushes heavily towards his R side. His RN requested that he remain in bed at end of session as he has been restless and upset this morning. Pt would continue to benefit from skilled physical therapy services at this time while admitted and after d/c to address the below listed limitations in order to improve overall safety and independence with functional mobility.  BP was stable throughout (151/56 mmHg) with all other VSS throughout as well.    05/08/20 1054  PT Visit Information  Last PT Received On 05/08/20  Assistance Needed +2  PT/OT/SLP Co-Evaluation/Treatment Yes  Reason for Co-Treatment Necessary to address cognition/behavior during functional activity;For patient/therapist safety;To address functional/ADL transfers  PT goals addressed during session Mobility/safety with mobility;Balance;Strengthening/ROM  History of Present Illness Patient is a 64 y/o male with PMH ETOH use, tobacco abuse, admitted with R side weakness,sensory loss and speech deficits.  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  Precautions  Precautions Fall  Precaution Comments pushes to R, coretrack, BP <180  Restrictions  Weight Bearing Restrictions No  Pain Assessment  Pain Assessment Faces  Faces Pain Scale 0  Pain Intervention(s) Monitored during session  Cognition  Arousal/Alertness Lethargic  Behavior During Therapy Flat affect  Overall Cognitive Status Difficult to assess  General Comments pt with almost no command follow. pt did state "okay" when therapist explaining to him that he was  in the hospital and that therapy was there to help him move; occasional eye contact with therapist and intermittently smiling once making eye contact   Difficult to assess due to Impaired communication  Bed Mobility  Overal bed mobility Needs Assistance  Bed Mobility Rolling;Supine to Sit;Sit to Supine  Rolling +2 for physical assistance;Total assist  Supine to sit Max assist;+2 for physical assistance  Sit to supine Max assist;+2 for physical assistance  General bed mobility comments use of bed egress to chair position and two person assist to bring shoulders away from [facility]. pt almost immediately pushing with LUE towards the R and requiring assist to facilitate reducing push with LUE and maintain midline; rolling towards L/R for placement of bed pad   Transfers  General transfer comment not attempted given lethargy  Modified Rankin (Stroke Patients Only)  Pre-Morbid Rankin Score 0  Modified Rankin 5  Balance  Overall balance assessment Needs assistance  Sitting balance-Leahy Scale Zero  Sitting balance - Comments required maximal assistance  PT - End of Session  Activity Tolerance Patient limited by fatigue;Patient limited by lethargy  Patient left in bed;with call bell/phone within reach;with bed alarm set;with restraints reapplied  Nurse Communication Mobility status;Need for lift equipment   PT - Assessment/Plan  PT Plan Current plan remains appropriate  PT Visit Diagnosis Other abnormalities of gait and mobility (R26.89);Hemiplegia and hemiparesis;Other symptoms and signs involving the nervous system (R29.898)  Hemiplegia - Right/Left Right  Hemiplegia - dominant/non-dominant Dominant  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 2  Help needed moving from lying on your back to sitting on the side of a flat bed without using bedrails? 1  Help needed moving to and from a bed to a chair (including a wheelchair)? 1  Help needed standing up from a chair using your arms (e.g., wheelchair or bedside chair)? 1  Help needed to walk in hospital room? 1  Help needed climbing 3-5 steps with a railing?  1  6 Click Score 7  Consider Recommendation of Discharge To: CIR/SNF/LTACH  PT Goal Progression  Progress towards PT goals Progressing toward goals  Acute Rehab PT Goals  PT Goal Formulation Patient unable to participate in goal setting  Time For Goal Achievement 05/21/20  Potential to Achieve Goals Fair  PT Time Calculation  PT Start Time (ACUTE ONLY) 0827  PT Stop Time (ACUTE ONLY) 1610  PT Time Calculation (min) (ACUTE ONLY) 25 min  PT General Charges  $$ ACUTE PT VISIT 1 Visit  PT Treatments  $Therapeutic Activity 8-22 mins  Arletta Bale, DPT  Acute Rehabilitation Services Pager [phone] Office [phone]

## 2020-05-08 NOTE — Progress Notes (Signed)
Inpatient Rehab Admissions Coordinator:   I spoke with pt.'s partner of 40 years, [name], to discuss potential CIR admission. She  Stated interest and confirmed that she and other family members will provide 24/7 support at discharge. Will pursue for potential admit next week, pending bed availability.   Megan Salon, MS, CCC-SLP Rehab Admissions Coordinator  [phone] (celll) [phone] (office)

## 2020-05-08 NOTE — Progress Notes (Signed)
STROKE TEAM PROGRESS NOTE   INTERVAL HISTORY RN at bedside.  Patient still has aphasia, left gaze and right hemiplegia.  CT repeat suboptimal due to motion artefacts but  stable left thalamic hemorrhage with similar localized mass effect and 8 mm of left-to-right midline shift.. We will transfer patient out of ICU today   Vitals:   05/08/20 0900 05/08/20 1000 05/08/20 1100 05/08/20 1200  BP: (!) 139/54 (!) 127/58 (!) 146/57 (!) 156/50  Pulse: 66 65 75 69  Resp: (!) 21 18 (!) 21 16  Temp:      TempSrc:      SpO2: 97% 99% 98% 98%  Weight:       CBC:  Recent Labs  Lab 05/05/20 2003 05/05/20 2003 05/06/20 0738 05/07/20 0337  WBC 8.8   < > 15.9* 16.2*  NEUTROABS 6.6  --  13.7*  --   HGB 12.3*   < > 11.6* 12.0*  HCT 40.8   < > 36.2* 38.3*  MCV 92.7   < > 91.6 92.1  PLT 149*   < > 154 136*   < > = values in this interval not displayed.   Basic Metabolic Panel:  Recent Labs  Lab 05/07/20 0337 05/07/20 1012 05/07/20 1931 05/08/20 1032  NA 136  --   --  138  K 3.4*  --   --  3.1*  CL 104  --   --  106  CO2 21*  --   --  21*  GLUCOSE 166*  --   --  139*  BUN 9  --   --  16  CREATININE 0.81  --   --  0.79  CALCIUM 9.5  --   --  9.3  MG  --    < > 1.6* 1.9  PHOS  --    < > 2.6 2.0*   < > = values in this interval not displayed.   Lipid Panel:  Recent Labs  Lab 05/05/20 2003  CHOL 146  TRIG 87  HDL 49  CHOLHDL 3.0  VLDL 17  LDLCALC 80   HgbA1c:  Recent Labs  Lab 05/05/20 2004  HGBA1C 4.6*   Urine Drug Screen:  Recent Labs  Lab 05/06/20 0752  LABOPIA NONE DETECTED  COCAINSCRNUR POSITIVE*  LABBENZ NONE DETECTED  AMPHETMU NONE DETECTED  THCU NONE DETECTED  LABBARB NONE DETECTED    Alcohol Level  Recent Labs  Lab 05/05/20 2003  ETH <10    IMAGING past 24 hours CT HEAD WO CONTRAST  Result Date: 05/08/2020 CLINICAL DATA:  Follow-up examination for intracranial hemorrhage. EXAM: CT HEAD WITHOUT CONTRAST TECHNIQUE: Contiguous axial images were obtained  from the base of the skull through the vertex without intravenous contrast. COMPARISON:  Prior CT from 05/05/2020. FINDINGS: Brain: Examination moderately to severely degraded by motion artifact. Intraparenchymal hemorrhage centered at the left thalamus again seen, little interval changed in size and morphology as compared to previous exam. Surrounding low-density vasogenic edema with localized mass effect, similar. Associated left-to-right shift measures up to approximately 8 mm, little interval changed. Intraventricular extension with blood within the left greater than right lateral ventricles is overall similar. Associated ventricular size morphology is not significantly changed on this motion degraded exam. Scattered small volume subarachnoid hemorrhage now seen within the posterior right frontotemporal region, likely related to redistribution. No new acute intracranial hemorrhage. No acute large vessel territory infarct. No visible extra-axial fluid collection. No other new acute intracranial abnormality. Vascular: No appreciable hyperdense vessel or other  abnormality on this motion degraded exam. Skull: Grossly stable allowing for motion. Sinuses/Orbits: Globes and orbital soft tissues demonstrate no acute finding. Paranasal sinuses are clear. Nasogastric tube in place. Mastoid air cells are clear. Other: None. IMPRESSION: 1. Moderately to severely degraded exam due to motion artifact. 2. No significant interval changed in size and morphology of left thalamic hemorrhage with similar localized mass effect and 8 mm of left-to-right midline shift. 3. Similar intraventricular extension with blood within the left greater than right lateral ventricles. Associated ventricular size morphology not significantly changed on this motion degraded exam. 4. Scattered small volume subarachnoid hemorrhage within the posterior right frontotemporal region, likely related to redistribution. 5. No other new acute intracranial  abnormality. Electronically Signed   By: Rise Mu M.D.   On: 05/08/2020 01:22   DG CHEST PORT 1 VIEW  Result Date: 05/07/2020 CLINICAL DATA:  Leukocytosis EXAM: PORTABLE CHEST 1 VIEW COMPARISON:  Portable exam 1322 hours compared to 05/05/2020 FINDINGS: Feeding tube extends into stomach. Normal heart size, mediastinal contours, and pulmonary vascularity. Atelectasis versus infiltrate at RIGHT base. Remaining lungs clear. No pleural effusion or pneumothorax. Bones demineralized. IMPRESSION: Atelectasis versus infiltrate at RIGHT lung base. Electronically Signed   By: Ulyses Southward M.D.   On: 05/07/2020 14:57    PHYSICAL EXAM   Temp:  [98.7 F (37.1 C)-101.6 F (38.7 C)] 101 F (38.3 C) (10/09 0800) Pulse Rate:  [65-101] 69 (10/09 1200) Resp:  [16-23] 16 (10/09 1200) BP: (121-173)/(50-69) 156/50 (10/09 1200) SpO2:  [92 %-99 %] 98 % (10/09 1200) Weight:  [65.5 kg] 65.5 kg (10/09 0500)  General - Well nourished, well developed, in no apparent distress.  Ophthalmologic - fundi not visualized due to noncooperation.  Cardiovascular - Regular rhythm and rate.  Neuro - drowsy sleepy but open eyes on voice. Global aphasia, able to make a few sounds but not words. Only followed commands of "eye opening" and "eye closing", no other commands. Not naming or repeating. Left gaze preference, abut able to cross midline. Right HH, not blinking to visual threat. PERRL, right upper and lower facial weakness. Tongue protrusion not cooperative. LUE at least 4/5 and LLE at least 3/5. However, RUE 0/5 mild withdraw to pain. RLE no spontaneous movement but withdraw to pain 3-/5. DTR 1+ and right positive babinski. Sensation, coordination not cooperative and gait not tested.   ASSESSMENT/PLAN [name] is a 64 y.o. male with history of alcohol and tobacco abuse presenting with RUE and RLE weakness, R sensory loss and mixed aphasia. SBP > 200. CT w/ L thalamocapsular IPH w/ IVH. Neuro worsening in  ED w/ IVH and hydrocephalus. NS consulted for EVD consideration, which was deferred.   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 Small vessel disease. Atrophy.   CT head 10/6 2042 enlarging L thalamocapsular IPH w/ increasing IVH from L ventricle into 3rd and 4th ventricles.  CT head 10/7 0510 stable L thalamic IPH w/ IVH w/ clearing of blood in 4th ventricle.   CTA head and neck 10/8 unremarkable angio, slight increase in midline shift and ventriculomegaly  MRI not able to perform due to no family contact to clear for metal  Repeat CT head 10/9 suboptimal but stable  2D Echo EF 60-65%  LDL 80  HgbA1c 4.6  UDS positive for cocaine  VTE prophylaxis - SCDs  No antithrombotic prior to admission, now on No antithrombotic given IPH  Therapy recommendations:  pending - ok OOB  Disposition:  pending   Keep in ICU today, consider xfer tomorrow  Cerebral Edema  CT head 10/6 2042 enlarging L thalamocapsular IPH w/ increasing IVH from L ventricle into 3rd and 4th ventricles.  Neurosurgery consulted Yetta Barre) no indication for EVD. Following.  CT head 10/7 0510 stable L thalamic IPH w/ IVH w/ clearing of blood in 4th ventricle.    CTA head and neck 10/8 unremarkable angio, slight increase in midline shift and ventriculomegaly CT head repeat 10/9 stable Hypertensive Emergency  Per EMS, SBP > 200   Home meds:  none  Put on Cleviprex for BP control -> now off  Hydralazine IV PRN  On norvasc 10 and lisinopril 20 bid  SBP goal < 160 . Long-term BP goal normotensive  Leukocytosis and fever  WBC 8.8->15.9->16.2  Tmax afebrile -> 101.3  UA neg  CXR mild cardiomegaly w/ mild central congestion, suspicion mild interstitial edema   Blood culture pending  Hyperlipidemia  Home meds:  None  LDL 80, goal < 70  No statin in setting of  IPH  Mild transaminitis, likely related to alcohol, AST/ALP 68/64->52/47  Consider statin low dose on discharge with normalized liver enzymes  Dysphagia . Secondary to stroke . NPO . On IVF @ 30 . cortrak placed . On TF @ 65 . Speech on board   Tobacco abuse  Current smoker  Smoking cessation counseling will be provided  Cocaine abuse  UDS positive for cocaine  Cocaine cessation will be provided  Avoid beta-blocker  Other Stroke Risk Factors  ETOH use, alcohol level <10, advised to drink no more than 2 drink(s) a day  Other Active Problems    Hospital day # 3 Transfer out of ICU today.Continue strict BP control.Ongoing therapies. This patient is critically ill due to fever and leukocytosis, dysphagia, hypertensive emergency, left BG ICH and IVH and at significant risk of neurological worsening, death form cerebral edema, brain herniation, obstructive hydrocephalus, sepsis, seizure, hypertensive encephalopathy. This patient's care requires constant monitoring of vital signs, hemodynamics, respiratory and cardiac monitoring, review of multiple databases, neurological assessment, discussion with family, other specialists and medical decision making of high complexity. I spent 30 minutes of neurocritical care time in the care of this patient.  Delia Heady, MD Stroke Neurology 05/08/2020 12:17 PM    To contact Stroke Continuity provider, please refer to [email or web address]. After hours, contact General Neurology

## 2020-05-08 NOTE — Progress Notes (Signed)
Occupational Therapy Treatment Patient Details Name: Alan Everett MRN: [id number] DOB: 1956/05/26 Today's Date: 05/08/2020    History of present illness Patient is a 64 y/o male with PMH ETOH use, tobacco abuse, admitted with R side weakness,sensory loss and speech deficits.  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   OT comments  Pt seen for splint check of RUE soft elbow splint. Splint check completed x2 (after 3hrs of wear and again after 6hrs). Pt tolerating full 6 hours well and with no signs/symptoms of adverse reactions. Pt tolerating gentle passive stretching of RUE into elbow extension after removal of splint and improvements noted in end range and with less tone/resistance. Splint schedule provided and placed above pt's bed. Pt to wear RUE soft elbow splint x6 hrs each night (to adjust PRN by OT). Will continue per POC at this time.    Follow Up Recommendations  CIR;Supervision/Assistance - 24 hour    Equipment Recommendations  Other (comment) (TBD)          Precautions / Restrictions Precautions Precautions: Fall Precaution Comments: pushes to R, coretrack, BP <180 Restrictions Weight Bearing Restrictions: No              ADL either performed or assessed with clinical judgement   ADL Overall ADL's : Needs assistance/impaired                                       General ADL Comments: pt seen for splint check and to establish splint wear schedule      Vision   Additional Comments: noted pt able to track towards midline at time of splint check and even vaguely start to look towards therapist when standing at his R side    Perception     Praxis      Cognition Arousal/Alertness: Lethargic Behavior During Therapy: Flat affect Overall Cognitive Status: Difficult to assess                                          Exercises Other Exercises Other Exercises:  gentle passive stretching to RUE after removal of soft elbow splint Other Exercises: splint check x2 (at q3hrs and 6hrs), pt tolerating wear well with no signs of redness, swelling or other adverse reaction. splint schedule posted    Shoulder Instructions       General Comments      Pertinent Vitals/ Pain       Pain Assessment: Faces Faces Pain Scale: No hurt  Home Living                                          Prior Functioning/Environment              Frequency  Min 2X/week        Progress Toward Goals  OT Goals(current goals can now be found in the care plan section)  Progress towards OT goals: Progressing toward goals  Acute Rehab OT Goals Patient Stated Goal: unable to state OT Goal Formulation: Patient unable to participate in goal setting Time For Goal Achievement: 05/21/20 Potential to Achieve Goals: Good ADL Goals Pt Will Perform Grooming: with min assist;sitting  Pt Will Transfer to Toilet: with min assist;stand pivot transfer;squat pivot transfer;bedside commode Additional ADL Goal #1: Pt will maintain eyes open for 50% of session Additional ADL Goal #2: Pt will be able to track right and left 5 times in succesion with increased time Additional ADL Goal #3: Pt will follow 3 out of 5 one step commands Additional ADL Goal #4: Pt will be able to sit EOB with minguard A in prep for transfers  Plan Discharge plan remains appropriate    Co-evaluation                 AM-PAC OT "6 Clicks" Daily Activity     Outcome Measure   Help from another person eating meals?: Total Help from another person taking care of personal grooming?: Total Help from another person toileting, which includes using toliet, bedpan, or urinal?: Total Help from another person bathing (including washing, rinsing, drying)?: Total Help from another person to put on and taking off regular upper body clothing?: Total Help from another person to put on and taking  off regular lower body clothing?: Total 6 Click Score: 6    End of Session    OT Visit Diagnosis: Other abnormalities of gait and mobility (R26.89);Muscle weakness (generalized) (M62.81);Low vision, both eyes (H54.2);Other symptoms and signs involving cognitive function;Hemiplegia and hemiparesis Hemiplegia - Right/Left: Right   Activity Tolerance Patient tolerated treatment well   Patient Left in bed;with call bell/phone within reach;with bed alarm set;with restraints reapplied   Nurse Communication Mobility status;Other (comment) (splint check/splint schedule provided )        Time: 1541-1600 OT Time Calculation (min): 19 min  Charges: OT General Charges $OT Visit: 1 Visit OT Treatments $Orthotics/Prosthetics Check: 8-22 mins   Marcy Siren, OT Acute Rehabilitation Services Pager [phone] Office [phone]   Orlando Penner 05/08/2020, 5:01 PM

## 2020-05-08 NOTE — Progress Notes (Signed)
Transferred patient to [phone] with belongings. Contacted [facility] to inform of room change.

## 2020-05-08 NOTE — Progress Notes (Addendum)
Occupational Therapy Treatment Patient Details Name: Alan Everett MRN: [id number] DOB: 09-28-1955 Today's Date: 05/08/2020    History of present illness Patient is a 64 y/o male with PMH ETOH use, tobacco abuse, admitted with R side weakness,sensory loss and speech deficits.  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   OT comments  Pt lethargic today and minimally responsive to therapists. He intermittently will make eye contact and smile but only able to maintain focus for <15 seconds. Pt did verbalize "Okay" x1 to therapist when explaining current location and situation. Pt remains with strong push to the R with LUE when sitting up away from [facility]. He also continues to present with flexor tone in RUE. Passive stretching provided during session and applied soft elbow splint to RUE to promote/maintain elbow extension. RN aware of splint donned and OT to check back for splint check in 2-3 hrs. Recommend continue per POC at this time.   Follow Up Recommendations  CIR;Supervision/Assistance - 24 hour    Equipment Recommendations  Other (comment) (TBD in next venue)          Precautions / Restrictions Precautions Precautions: Fall Precaution Comments: pushes to R, coretrack, BP <180 Restrictions Weight Bearing Restrictions: No       Mobility Bed Mobility Overal bed mobility: Needs Assistance Bed Mobility: Rolling;Supine to Sit Rolling: +2 for physical assistance;+2 for safety/equipment;Max assist;Total assist   Supine to sit: Max assist;+2 for physical assistance;+2 for safety/equipment     General bed mobility comments: use of bed egress to chair position and two person assist to bring shoulders away from [facility]. pt almost immediately pushing with LUE towards the R and requiring assist to facilitate reducing push with LUE and maintain midline; rolling towards L/R for placement of bed pad   Transfers                  General transfer comment: not attempted given lethargy    Balance Overall balance assessment: Needs assistance   Sitting balance-Leahy Scale: Zero Sitting balance - Comments: at least maxA for unsupported balance                                    ADL either performed or assessed with clinical judgement   ADL Overall ADL's : Needs assistance/impaired                                       General ADL Comments: pt currently totalA      Vision   Vision Assessment?: Vision impaired- to be further tested in functional context Additional Comments: pt occasionally making eye contact with therapist and with faciliation of turning head able to track towards midline, but only able to maintain focus for very brief period of time    Perception     Praxis      Cognition Arousal/Alertness: Lethargic Behavior During Therapy: Flat affect Overall Cognitive Status: Difficult to assess                                 General Comments: pt with almost no command follow. pt did state "okay" when therapist explaining to him that he was in the hospital and that therapy was there to help him  move; occasional eye contact with therapist and intermittently smiling once making eye contact         Exercises Exercises: Other exercises Other Exercises Other Exercises: PROM to all extremities Other Exercises: passive stretching to RUE to promote elbow extension, soft elbow splint applied    Shoulder Instructions       General Comments      Pertinent Vitals/ Pain       Pain Assessment: Faces Faces Pain Scale: No hurt Pain Intervention(s): Monitored during session  Home Living                                          Prior Functioning/Environment              Frequency  Min 2X/week        Progress Toward Goals  OT Goals(current goals can now be found in the care plan section)  Progress towards OT goals: Progressing  toward goals  Acute Rehab OT Goals Patient Stated Goal: unable to state OT Goal Formulation: Patient unable to participate in goal setting Time For Goal Achievement: 05/21/20 Potential to Achieve Goals: Good ADL Goals Pt Will Perform Grooming: with min assist;sitting Pt Will Transfer to Toilet: with min assist;stand pivot transfer;squat pivot transfer;bedside commode Additional ADL Goal #1: Pt will maintain eyes open for 50% of session Additional ADL Goal #2: Pt will be able to track right and left 5 times in succesion with increased time Additional ADL Goal #3: Pt will follow 3 out of 5 one step commands Additional ADL Goal #4: Pt will be able to sit EOB with minguard A in prep for transfers  Plan Discharge plan remains appropriate    Co-evaluation    PT/OT/SLP Co-Evaluation/Treatment: Yes Reason for Co-Treatment: Necessary to address cognition/behavior during functional activity;For patient/therapist safety;To address functional/ADL transfers   OT goals addressed during session: Strengthening/ROM      AM-PAC OT "6 Clicks" Daily Activity     Outcome Measure   Help from another person eating meals?: Total Help from another person taking care of personal grooming?: Total Help from another person toileting, which includes using toliet, bedpan, or urinal?: Total Help from another person bathing (including washing, rinsing, drying)?: Total Help from another person to put on and taking off regular upper body clothing?: Total Help from another person to put on and taking off regular lower body clothing?: Total 6 Click Score: 6    End of Session    OT Visit Diagnosis: Other abnormalities of gait and mobility (R26.89);Muscle weakness (generalized) (M62.81);Low vision, both eyes (H54.2);Other symptoms and signs involving cognitive function;Hemiplegia and hemiparesis Hemiplegia - Right/Left: Right   Activity Tolerance Patient limited by lethargy   Patient Left in bed;with call  bell/phone within reach;with bed alarm set;with restraints reapplied   Nurse Communication Mobility status;Other (comment) (elbow splint donned )        Time: 6734-1937 OT Time Calculation (min): 24 min  Charges: OT General Charges $OT Visit: 1 Visit OT Treatments $Therapeutic Activity: 8-22 mins  Marcy Siren, OT Acute Rehabilitation Services Pager [phone] Office [phone]    Orlando Penner 05/08/2020, 10:35 AM

## 2020-05-09 ENCOUNTER — Inpatient Hospital Stay (HOSPITAL_COMMUNITY): Payer: Medicaid Other

## 2020-05-09 LAB — CBC
HCT: 34.1 % — ABNORMAL LOW (ref 39.0–52.0)
Hemoglobin: 10.7 g/dL — ABNORMAL LOW (ref 13.0–17.0)
MCH: 28.5 pg (ref 26.0–34.0)
MCHC: 31.4 g/dL (ref 30.0–36.0)
MCV: 90.9 fL (ref 80.0–100.0)
Platelets: 144 10*3/uL — ABNORMAL LOW (ref 150–400)
RBC: 3.75 MIL/uL — ABNORMAL LOW (ref 4.22–5.81)
RDW: 21.1 % — ABNORMAL HIGH (ref 11.5–15.5)
WBC: 26.7 10*3/uL — ABNORMAL HIGH (ref 4.0–10.5)
nRBC: 0.1 % (ref 0.0–0.2)

## 2020-05-09 LAB — BASIC METABOLIC PANEL
Anion gap: 11 (ref 5–15)
BUN: 24 mg/dL — ABNORMAL HIGH (ref 8–23)
CO2: 21 mmol/L — ABNORMAL LOW (ref 22–32)
Calcium: 8.7 mg/dL — ABNORMAL LOW (ref 8.9–10.3)
Chloride: 109 mmol/L (ref 98–111)
Creatinine, Ser: 0.96 mg/dL (ref 0.61–1.24)
GFR, Estimated: >60 mL/min (ref >60)
Glucose, Bld: 150 mg/dL — ABNORMAL HIGH (ref 70–99)
Potassium: 3.3 mmol/L — ABNORMAL LOW (ref 3.5–5.1)
Sodium: 141 mmol/L (ref 135–145)

## 2020-05-09 LAB — URINALYSIS, ROUTINE W REFLEX MICROSCOPIC
Bilirubin Urine: NEGATIVE
Glucose, UA: NEGATIVE mg/dL
Ketones, ur: NEGATIVE mg/dL
Leukocytes,Ua: NEGATIVE
Nitrite: NEGATIVE
Protein, ur: NEGATIVE mg/dL
Specific Gravity, Urine: 1.018 (ref 1.005–1.030)
pH: 6 (ref 5.0–8.0)

## 2020-05-09 LAB — HEPATIC FUNCTION PANEL
ALT: 45 U/L — ABNORMAL HIGH (ref 0–44)
AST: 35 U/L (ref 15–41)
Albumin: 2.8 g/dL — ABNORMAL LOW (ref 3.5–5.0)
Alkaline Phosphatase: 70 U/L (ref 38–126)
Bilirubin, Direct: 0.2 mg/dL (ref 0.0–0.2)
Indirect Bilirubin: 0.3 mg/dL (ref 0.3–0.9)
Total Bilirubin: 0.5 mg/dL (ref 0.3–1.2)
Total Protein: 7.5 g/dL (ref 6.5–8.1)

## 2020-05-09 LAB — GLUCOSE, CAPILLARY
Glucose-Capillary: 156 mg/dL — ABNORMAL HIGH (ref 70–99)
Glucose-Capillary: 135 mg/dL — ABNORMAL HIGH (ref 70–99)
Glucose-Capillary: 130 mg/dL — ABNORMAL HIGH (ref 70–99)
Glucose-Capillary: 121 mg/dL — ABNORMAL HIGH (ref 70–99)
Glucose-Capillary: 155 mg/dL — ABNORMAL HIGH (ref 70–99)

## 2020-05-09 LAB — MAGNESIUM: Magnesium: 2.2 mg/dL (ref 1.7–2.4)

## 2020-05-09 LAB — PHOSPHORUS: Phosphorus: 2 mg/dL — ABNORMAL LOW (ref 2.5–4.6)

## 2020-05-09 MED ORDER — POTASSIUM CHLORIDE 20 MEQ/15ML (10%) PO SOLN
20.0000 meq | Freq: Two times a day (BID) | ORAL | Status: DC
  Administered 2020-05-09: 20 meq
  Filled 2020-05-09 (×2): qty 15

## 2020-05-09 MED ORDER — SODIUM CHLORIDE 0.9 % IV SOLN
3.0000 g | Freq: Four times a day (QID) | INTRAVENOUS | Status: DC
  Administered 2020-05-09 – 2020-05-12 (×12): 3 g via INTRAVENOUS
  Filled 2020-05-09: qty 8
  Filled 2020-05-09: qty 3
  Filled 2020-05-09: qty 8
  Filled 2020-05-09 (×2): qty 3
  Filled 2020-05-09: qty 8
  Filled 2020-05-09: qty 3
  Filled 2020-05-09 (×2): qty 8
  Filled 2020-05-09: qty 3
  Filled 2020-05-09: qty 8
  Filled 2020-05-09: qty 3
  Filled 2020-05-09: qty 8
  Filled 2020-05-09: qty 3
  Filled 2020-05-09 (×2): qty 8

## 2020-05-09 MED ORDER — POTASSIUM PHOSPHATES 15 MMOLE/5ML IV SOLN
20.0000 mmol | Freq: Once | INTRAVENOUS | Status: AC
  Administered 2020-05-09: 20 mmol via INTRAVENOUS
  Filled 2020-05-09: qty 6.67

## 2020-05-09 MED ORDER — SODIUM CHLORIDE 0.9 % IV SOLN
3.0000 g | Freq: Three times a day (TID) | INTRAVENOUS | Status: DC
  Filled 2020-05-09 (×2): qty 8

## 2020-05-09 MED ORDER — LEVALBUTEROL HCL 0.63 MG/3ML IN NEBU
0.6300 mg | INHALATION_SOLUTION | Freq: Two times a day (BID) | RESPIRATORY_TRACT | Status: DC
  Administered 2020-05-10 – 2020-05-16 (×14): 0.63 mg via RESPIRATORY_TRACT
  Filled 2020-05-09 (×14): qty 3

## 2020-05-09 MED ORDER — IPRATROPIUM BROMIDE 0.02 % IN SOLN
0.5000 mg | Freq: Four times a day (QID) | RESPIRATORY_TRACT | Status: DC
  Administered 2020-05-09: 0.5 mg via RESPIRATORY_TRACT
  Filled 2020-05-09: qty 2.5

## 2020-05-09 MED ORDER — LEVALBUTEROL HCL 0.63 MG/3ML IN NEBU
0.6300 mg | INHALATION_SOLUTION | Freq: Four times a day (QID) | RESPIRATORY_TRACT | Status: DC
  Administered 2020-05-09: 0.63 mg via RESPIRATORY_TRACT
  Filled 2020-05-09: qty 3

## 2020-05-09 MED ORDER — IPRATROPIUM BROMIDE 0.02 % IN SOLN
0.5000 mg | Freq: Two times a day (BID) | RESPIRATORY_TRACT | Status: DC
  Administered 2020-05-10 – 2020-05-16 (×14): 0.5 mg via RESPIRATORY_TRACT
  Filled 2020-05-09 (×14): qty 2.5

## 2020-05-09 NOTE — Progress Notes (Signed)
STROKE TEAM PROGRESS NOTE   INTERVAL HISTORY RN  And Dr Kandis Mannan are at bedside.  Patient still has aphasia, left gaze and right hemiplegia.  He has fever 101 and chest x-ray shows likely infiltrate.  He has been started on Unasyn..  White count is significantly elevated.  Vitals:   05/08/20 1918 05/08/20 2135 05/08/20 2347 05/09/20 0330  BP: (!) 160/76 140/71 138/77 (!) 141/73  Pulse: 82 91 88 89  Resp: 18  18 18   Temp: 98.7 F (37.1 C)  98.5 F (36.9 C) 98.3 F (36.8 C)  TempSrc: Axillary  Oral Oral  SpO2: 99%  98% 99%  Weight:    65.3 kg   CBC:  Recent Labs  Lab 05/05/20 2003 05/05/20 2003 05/06/20 0738 05/07/20 0337 05/08/20 1736 05/09/20 0500  WBC 8.8   < > 15.9*   < > 24.1* 26.7*  NEUTROABS 6.6  --  13.7*  --   --   --   HGB 12.3*   < > 11.6*   < > 11.9* 10.7*  HCT 40.8   < > 36.2*   < > 37.9* 34.1*  MCV 92.7   < > 91.6   < > 92.0 90.9  PLT 149*   < > 154   < > 150 144*   < > = values in this interval not displayed.   Basic Metabolic Panel:  Recent Labs  Lab 05/08/20 1032 05/08/20 1736 05/09/20 0500  NA 138  --  141  K 3.1*  --  3.3*  CL 106  --  109  CO2 21*  --  21*  GLUCOSE 139*  --  150*  BUN 16  --  24*  CREATININE 0.79  --  0.96  CALCIUM 9.3  --  8.7*  MG 1.9 1.9  --   PHOS 2.0* 1.5*  --    Lipid Panel:  Recent Labs  Lab 05/05/20 2003  CHOL 146  TRIG 87  HDL 49  CHOLHDL 3.0  VLDL 17  LDLCALC 80   HgbA1c:  Recent Labs  Lab 05/05/20 2004  HGBA1C 4.6*   Urine Drug Screen:  Recent Labs  Lab 05/06/20 0752  LABOPIA NONE DETECTED  COCAINSCRNUR POSITIVE*  LABBENZ NONE DETECTED  AMPHETMU NONE DETECTED  THCU NONE DETECTED  LABBARB NONE DETECTED    Alcohol Level  Recent Labs  Lab 05/05/20 2003  ETH <10    IMAGING past 24 hours No results found.  PHYSICAL EXAM   Temp:  [98.3 F (36.8 C)-101 F (38.3 C)] 98.3 F (36.8 C) (10/10 0330) Pulse Rate:  [64-111] 89 (10/10 0330) Resp:  [16-24] 18 (10/10 0330) BP: (127-161)/(50-77)  141/73 (10/10 0330) SpO2:  [94 %-99 %] 99 % (10/10 0330) Weight:  [65.3 kg] 65.3 kg (10/10 0330)  General -frail middle-aged African-American male, in no apparent distress.  Ophthalmologic - fundi not visualized due to noncooperation.  Cardiovascular - Regular rhythm and rate.  Neuro - drowsy sleepy but open eyes on voice. Global aphasia, able to make a few sounds but not words. Only followed commands of "eye opening" and "eye closing", no other commands. Not naming or repeating. Left gaze preference, abut able to cross midline. Right HH, not blinking to visual threat. PERRL, right upper and lower facial weakness. Tongue protrusion not cooperative. LUE at least 4/5 and LLE at least 3/5. However, RUE 0/5 mild withdraw to pain. RLE no spontaneous movement but withdraw to pain 3-/5. DTR 1+ and right positive babinski. Sensation, coordination not  cooperative and gait not tested.   ASSESSMENT/PLAN [name] is a 64 y.o. male with history of alcohol and tobacco abuse presenting with RUE and RLE weakness, R sensory loss and mixed aphasia. SBP > 200. CT w/ L thalamocapsular IPH w/ IVH. Neuro worsening in ED w/ IVH and hydrocephalus. NS consulted for EVD consideration, which was deferred.   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 Small vessel disease. Atrophy.   CT head 10/6 2042 enlarging L thalamocapsular IPH w/ increasing IVH from L ventricle into 3rd and 4th ventricles.  CT head 10/7 0510 stable L thalamic IPH w/ IVH w/ clearing of blood in 4th ventricle.   CTA head and neck 10/8 unremarkable angio, slight increase in midline shift and ventriculomegaly  MRI not able to perform due to no family contact to clear for metal  Repeat CT head 10/9 suboptimal but stable  2D Echo EF 60-65%  LDL 80  HgbA1c 4.6  UDS positive for cocaine  VTE prophylaxis -  SCDs   No antithrombotic prior to admission, now on No antithrombotic given IPH  Therapy recommendations:  pending - ok OOB  Disposition:  pending   Keep in ICU today, consider xfer tomorrow  Cerebral Edema  CT head 10/6 2042 enlarging L thalamocapsular IPH w/ increasing IVH from L ventricle into 3rd and 4th ventricles.  Neurosurgery consulted Yetta Barre) no indication for EVD. Following.  CT head 10/7 0510 stable L thalamic IPH w/ IVH w/ clearing of blood in 4th ventricle.    CTA head and neck 10/8 unremarkable angio, slight increase in midline shift and ventriculomegaly CT head repeat 10/9 stable Hypertensive Emergency  Per EMS, SBP > 200   Home meds:  none  Put on Cleviprex for BP control -> now off  Hydralazine IV PRN  On norvasc 10 and lisinopril 20 bid  SBP goal < 160 . Long-term BP goal normotensive  Leukocytosis and fever  WBC 8.8->15.9->16.2-26.7  Tmax afebrile -> 101.3  UA neg  CXR mild cardiomegaly w/ mild central congestion, suspicion mild interstitial edema   Blood culture pending  Hyperlipidemia  Home meds:  None  LDL 80, goal < 70  No statin in setting of IPH  Mild transaminitis, likely related to alcohol, AST/ALP 68/64->52/47  Consider statin low dose on discharge with normalized liver enzymes  Dysphagia . Secondary to stroke . NPO . On IVF @ 30 . cortrak placed . On TF @ 65 . Speech on board   Tobacco abuse  Current smoker  Smoking cessation counseling will be provided  Cocaine abuse  UDS positive for cocaine  Cocaine cessation will be provided  Avoid beta-blocker  Other Stroke Risk Factors  ETOH use, alcohol level <10, advised to drink no more than 2 drink(s) a day  Other Active Problems Fever and leukocytes likely from aspiration pneumonia Unasyn started 05/09/2020   Hospital day # 4 Appreciate help from medical hospitalist team Dr. Kandis Mannan.  Agree with Unasyn.  Continue supportive care.  Repeat labs in the morning.   Long discussion with patient, nurse and Dr. Kandis Mannan.  Greater than 50% time during the 35-minute visit was spent in counseling and coordination of care team. Delia Heady, MD   To contact Stroke Continuity provider, please refer to [email or web address]. After hours, contact General Neurology

## 2020-05-09 NOTE — Progress Notes (Signed)
PROGRESS NOTE    Alan Everett  QIH:[id number] DOB: [birth year] DOA: 05/05/2020 PCP: Patient, No Pcp Per   Brief Narrative:  HPI per [name] on 05/05/20 Alan Everett is an 64 y.o. male with a PMHx of alcohol use and tobacco abuse who presents acutely to the ED via EMS as a Code Stroke for acute onset of right sided weakness. He denies headache but acknowledges that he has sensory loss and weakness involving his RUE and RLE. He states that he is weak on the right, His speech pattern is most consistent with a mixed receptive and expressive dysphasia. 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   CT head: 1. 2.3 x 2.1 x 2.9 cm acute intraparenchymal hemorrhage centered at the left thalamic capsular region, estimated volume 7 cc. Minimal surrounding vasogenic edema without significant regional mass effect or midline shift. Associated intraventricular extension with small volume intraventricular blood within the adjacent left lateral ventricle. No hydrocephalus or ventricular trapping. 2. No other acute intracranial abnormality. 3. Underlying age-related cerebral atrophy with mild chronic small vessel ischemic disease.  ICH score: 1  **Interim History Patient's blood pressure was significantly elevated.  CT showed a left forearm mass R IPH with IVH.  His neurosurgeon started working the ED with IVH and hydrocephalus.  Neurosurgery was consulted for EVD consideration which was then referred.  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  He is currently underwent stroke work-up and has been transitioned to [facility] service given that he is now out of the ICU.  Today he spiked a worsening temperature and his WBC is worsening so we started the patient on IV Unasyn.  PT OT recommending CIR.  Patient is not very responsive and continues to have global aphasia and does not really follow commands.  Assessment & Plan:   Active Problems:   ICH (intracerebral  hemorrhage) (HCC)   SIRS (systemic inflammatory response syndrome) (HCC)   Polysubstance abuse (HCC)   Dysphagia, post-stroke   Hyperglycemia   Hypokalemia   Hypomagnesemia  Acute Hemorrhagic Stroke - L BG ICH w/ IVH d/t severe hypertension in setting of alcohol and cocaine use -Code Stroke CT head 10/6 1844 L thalamocapsular IPH 7cc w/ minimal edema, mild IVH. Small vessel disease. Atrophy.  -CT head 10/6 2042 enlarging L thalamocapsular IPH w/ increasing IVH from L ventricle into 3rd and 4th ventricles. -CT head 10/7 0510 stable L thalamic IPH w/ IVH w/ clearing of blood in 4th ventricle.  -CTA head and neck 10/8 unremarkable angio, slight increase in midline shift and ventriculomegaly -MRI not able to perform due to no family contact to clear for metal -Repeat CT head 10/9 suboptimal but stable -2D Echo EF 60-65% -LDL 80 -HgbA1c 4.6 -UDS positive for cocaine -VTE prophylaxis - SCDs  -No antithrombotic prior to admission, now on No antithrombotic given IPH -Therapy recommendations:  CIR vs SNF - ok OOB -Management per Neurology   Cerebral Edema -CT head 10/6 2042 enlarging L thalamocapsular IPH w/ increasing IVH from L ventricle into 3rd and 4th ventricles. -Neurosurgery Dr. Yetta Barre consulted no indication for EVD. Following. -CT head 10/7 0510 stable L thalamic IPH w/ IVH w/ clearing of blood in 4th ventricle.   -CTA head and neck 10/8 unremarkable angio, slight increase in midline shift and ventriculomegaly -CT head repeat 10/9 stable -Further Care per Neurology   Hypertensive Emergency -Per EMS, SBP > 200  -Home meds:  none -Weaned off ofPut on Cleviprex for BP control ->  now off -Hydralazine IV PRN -Now On Amlodipine 10 and lisinopril 20 bid -SBP goal < 160 -Long-term BP goal normotensive  Hyperlipidemia -Home meds:  None -LDL 80, goal < 70 -No statin in setting of IPH -Mild transaminitis, likely related to alcohol, AST/ALT 68/64->52/47 -> 35/45 Respectively -Will  Consider statin low dose on discharge with normalized liver enzymes and defer to Neurology to initiate   Dysphagia -Secondary to stroke -C/w NPO; Will need SLP to Follow -On IVF with NS at 30 mL/hr -Cortrak place -On TF @ 65 -Continue to Monitor Progress   Tobacco Abuse -Patient is a current smoker and will need smoking cessation counseling given once he is more awake  Cocaine abuse -UDS positive for cocaine -Cocaine cessation will be provided when he is more awake -Avoid beta-blocker  Hypophosphatemia -Patient's Phos Level was 2.0 -Replete with IV K Phos 20 mmol -Continue to Monitor and Replete as Necessary -Repeat Phos Level in the AM  Hypokalemia -Patient's K+ this AM was 3.3 -Replete with IV K Phos 20 mmol and with po KCl 20 mEQ BID x2 per PEG -Continue to Monitor and Replete as Necessary -Repeat CMP in the AM   Normocytic Anemia -Patient's Hgb/Hct trending down from 12.0/38.3 -> 11.9/37.9 -> 10.7/34.1 -In the setting of Dilution -C/w Ferrous Sulfate 300 mg per Tube Daily -Check Anemia Panel in the AM -Continue to Monitor for S/Sx of Bleeding; Currently no overt bleeding noted -Repeat CBC in the AM   Leukocytosis and Fever in the setting of Aspiration Pneumonia  -Likely in the setting of aspiration pneumonia -We will panculture and obtain blood cultures x2, urinalysis and urine culture, and repeat chest x-ray -urinalysis is relatively unremarkable and showed a hazy appearance with small hemoglobin 11 dipstick, present amorphous cells, rare bacteria, 0-5 RBCs per high-power field, and 6-10 WBCs -Urine culture blood cultures x2 pending -Chest x-ray today showed "Persistent right lower lung heterogeneous opacities which may represent atelectasis or infection. New opacities left mid lung may represent atelectasis or infection." -Elevate head of the bed greater than 30 degrees  -Add breathing Treatments with Scheduled Xopenex/Atrovent -WBC has been worsening in the last  few days and has gone from 16.2 -> 24.1 -> 26.7 -Empirically started IV Unasyn given his concern for aspiration from his stroke -Continue with acetaminophen for fever -Continue monitor temperature curve and WBCs and repeat both in a.m.  GERD -C/w Pantorpazole 40 mg per Tube Daily qHS  Thrombocytopenia -Mild as patient's platelet count went from 150,000 is 144,000 -Continueto monitor for signs and symptoms bleeding; currently no overt bleeding  -Repeat CBC in the a.m.  Hyperglycemia  -Likely reactive on tube feedings and his hemoglobin A1c is 4.6 -We will continue monitor CBGs carefully and ranging from 99-166 on BMP/CMP -If necessary will place on sensitive NovoLog sliding scale insulin every 4h   DVT prophylaxis: SCDs Code Status: FULL CODE Family Communication: No family present at bedside Disposition Plan: Need further clinical improvement back to baseline and anticipate discharge to CIR versus SNF once medically stable  Status is: Inpatient  Remains inpatient appropriate because:Altered mental status, Unsafe d/c plan, IV treatments appropriate due to intensity of illness or inability to take PO and Inpatient level of care appropriate due to severity of illness   Dispo: The patient is from: Home              Anticipated d/c is to: CIR vs. SNF              Anticipated  d/c date is: 3 days              Patient currently is not medically stable to d/c.  Consultants:   Neurology  Procedures:  ECHOCARDIOGRAM IMPRESSIONS    1. Left ventricular ejection fraction, by estimation, is 60 to 65%. The  left ventricle has normal function. The left ventricle has no regional  wall motion abnormalities. Left ventricular diastolic parameters are  consistent with Grade I diastolic  dysfunction (impaired relaxation).  2. Right ventricular systolic function is normal. The right ventricular  size is normal. Tricuspid regurgitation signal is inadequate for assessing  PA pressure.  3.  The mitral valve is normal in structure. No evidence of mitral valve  regurgitation. No evidence of mitral stenosis.  4. The aortic valve is tricuspid. Aortic valve regurgitation is not  visualized. No aortic stenosis is present.  5. The inferior vena cava is normal in size with greater than 50%  respiratory variability, suggesting right atrial pressure of 3 mmHg.   FINDINGS  Left Ventricle: Left ventricular ejection fraction, by estimation, is 60  to 65%. The left ventricle has normal function. The left ventricle has no  regional wall motion abnormalities. The left ventricular internal cavity  size was normal in size. There is  no left ventricular hypertrophy. Left ventricular diastolic parameters  are consistent with Grade I diastolic dysfunction (impaired relaxation).   Right Ventricle: The right ventricular size is normal. No increase in  right ventricular wall thickness. Right ventricular systolic function is  normal. Tricuspid regurgitation signal is inadequate for assessing PA  pressure.   Left Atrium: Left atrial size was normal in size.   Right Atrium: Right atrial size was normal in size.   Pericardium: There is no evidence of pericardial effusion.   Mitral Valve: The mitral valve is normal in structure. No evidence of  mitral valve regurgitation. No evidence of mitral valve stenosis.   Tricuspid Valve: The tricuspid valve is normal in structure. Tricuspid  valve regurgitation is not demonstrated.   Aortic Valve: The aortic valve is tricuspid. Aortic valve regurgitation is  not visualized. No aortic stenosis is present.   Pulmonic Valve: The pulmonic valve was normal in structure. Pulmonic valve  regurgitation is not visualized.   Aorta: The aortic root is normal in size and structure.   Venous: The inferior vena cava is normal in size with greater than 50%  respiratory variability, suggesting right atrial pressure of 3 mmHg.   IAS/Shunts: No atrial level  shunt detected by color flow Doppler.     LEFT VENTRICLE  PLAX 2D  LVIDd:     4.30 cm Diastology  LVIDs:     2.70 cm LV e' medial:  8.49 cm/s  LV PW:     1.00 cm LV E/e' medial: 9.2  LV IVS:    0.90 cm LV e' lateral:  7.46 cm/s  LVOT diam:   2.20 cm LV E/e' lateral: 10.4  LV SV:     98  LV SV Index:  57  LVOT Area:   3.80 cm     RIGHT VENTRICLE  RV S prime:   13.90 cm/s  TAPSE (M-mode): 2.0 cm   LEFT ATRIUM       Index    RIGHT ATRIUM      Index  LA diam:    3.10 cm 1.78 cm/m RA Area:   11.60 cm  LA Vol (A2C):  40.9 ml 23.49 ml/m RA Volume:  24.60 ml 14.13 ml/m  LA Vol (A4C):  37.9 ml 21.77 ml/m  LA Biplane Vol: 43.0 ml 24.69 ml/m  AORTIC VALVE  LVOT Vmax:  120.00 cm/s  LVOT Vmean: 75.400 cm/s  LVOT VTI:  0.259 m    AORTA  Ao Root diam: 3.20 cm   MITRAL VALVE  MV Area (PHT): 2.80 cm  SHUNTS  MV Decel Time: 271 msec  Systemic VTI: 0.26 m  MV E velocity: 77.70 cm/s Systemic Diam: 2.20 cm  MV A velocity: 84.50 cm/s  MV E/A ratio: 0.92   Antimicrobials:  Anti-infectives (From admission, onward)   Start     Dose/Rate Route Frequency Ordered Stop   05/09/20 0930  Ampicillin-Sulbactam (UNASYN) 3 g in sodium chloride 0.9 % 100 mL IVPB  Status:  Discontinued        3 g 200 mL/hr over 30 Minutes Intravenous Every 8 hours 05/09/20 0837 05/09/20 0839   05/09/20 0930  Ampicillin-Sulbactam (UNASYN) 3 g in sodium chloride 0.9 % 100 mL IVPB        3 g 200 mL/hr over 30 Minutes Intravenous Every 6 hours 05/09/20 0839          Subjective: Seen and examined remains encephalopathic and does not really arouse to verbal or physical stimuli.  Remains aphasic and continues to have right hemiplegia.  Currently ill-appearing.  Nursing states that he has had a fever.  Unable to verbalize and provide a subjective history today.  Objective: Vitals:   05/09/20 0745 05/09/20 0940 05/09/20 1140 05/09/20 1153    BP: (!) 147/63 (!) 156/68 (!) 114/59 118/64  Pulse: 100  86 85  Resp: 18  20 18   Temp: (!) 100.8 F (38.2 C) (!) 101.6 F (38.7 C) 99.8 F (37.7 C) 98.6 F (37 C)  TempSrc: Oral Oral Oral Oral  SpO2: 98%   97%  Weight:        Intake/Output Summary (Last 24 hours) at 05/09/2020 1526 Last data filed at 05/09/2020 1000 Gross per 24 hour  Intake 10 ml  Output 1300 ml  Net -1290 ml   Filed Weights   05/06/20 0535 05/08/20 0500 05/09/20 0330  Weight: 65.5 kg 65.5 kg 65.3 kg   Examination: Physical Exam:  Constitutional: Thin chronically ill-appearing African-American male currently in mild distress appears calm but does appear little uncomfortable.   Eyes: Denies to have a left-sided gaze ENMT: External Ears, Nose appear normal.  Cortrak in place Neck: Appears normal, supple, no cervical masses, normal ROM, no appreciable thyromegaly; no JVD Respiratory: Diminished to auscultation bilaterally with coarse breath sounds, no wheezing, rales, rhonchi or crackles. Normal respiratory effort and patient is not tachypenic. No accessory muscle use.  Unlabored breathing Cardiovascular: RRR, no murmurs / rubs / gallops. S1 and S2 auscultated.  Trace extremity edema Abdomen: Soft, non-tender, non-distended. Bowel sounds positive.  GU: Deferred. Musculoskeletal: No clubbing / cyanosis of digits/nails.  Has some right sided hemiplegia Skin: No rashes, lesions, ulcers on limited skin evaluation. No induration; Warm and dry.  Neurologic: Does not really follow commands or interact today and continues to be aphasic Psychiatric: Impaired judgment and insight.  He is not alert and oriented x 3. Normal mood and appropriate affect.   Data Reviewed: I have personally reviewed following labs and imaging studies  CBC: Recent Labs  Lab 05/05/20 1835 05/05/20 1928 05/05/20 2003 05/06/20 0738 05/07/20 0337 05/08/20 1736 05/09/20 0500  WBC 3.0*   < > 8.8 15.9* 16.2* 24.1* 26.7*  NEUTROABS 1.6*   --  6.6 13.7*  --   --   --  HGB 12.4*   < > 12.3* 11.6* 12.0* 11.9* 10.7*  HCT 40.8   < > 40.8 36.2* 38.3* 37.9* 34.1*  MCV 92.9   < > 92.7 91.6 92.1 92.0 90.9  PLT 6*   < > 149* 154 136* 150 144*   < > = values in this interval not displayed.   Basic Metabolic Panel: Recent Labs  Lab 05/05/20 1835 05/05/20 1835 05/05/20 1928 05/06/20 0738 05/07/20 0337 05/07/20 1012 05/07/20 1931 05/08/20 1032 05/08/20 1736 05/09/20 0500 05/09/20 0827  NA 137   < > 139 137 136  --   --  138  --  141  --   K 3.9   < > 5.2* 3.9 3.4*  --   --  3.1*  --  3.3*  --   CL 106   < > 108 106 104  --   --  106  --  109  --   CO2 20*  --   --  20* 21*  --   --  21*  --  21*  --   GLUCOSE 101*   < > 99 151* 166*  --   --  139*  --  150*  --   BUN 9   < > --   --  16  --  24*  --   CREATININE 0.97   < > 0.80 0.97 0.81  --   --  0.79  --  0.96  --   CALCIUM 9.5  --   --  9.2 9.5  --   --  9.3  --  8.7*  --   MG  --   --   --   --   --  1.6* 1.6* 1.9 1.9  --  2.2  PHOS  --   --   --   --   --  2.6 2.6 2.0* 1.5*  --  2.0*   < > = values in this interval not displayed.   GFR: Estimated Creatinine Clearance: 70.2 mL/min (by C-G formula based on SCr of 0.96 mg/dL). Liver Function Tests: Recent Labs  Lab 05/05/20 1835 05/06/20 0738 05/09/20 0827  AST 68* 52* 35  ALT 64* 47* 45*  ALKPHOS 90 73 70  BILITOT 0.8 0.6 0.5  PROT 8.6* 7.6 7.5  ALBUMIN 3.9 3.5 2.8*   No results for input(s): LIPASE, AMYLASE in the last 168 hours. No results for input(s): AMMONIA in the last 168 hours. Coagulation Profile: Recent Labs  Lab 05/05/20 1930  INR 1.2   Cardiac Enzymes: No results for input(s): CKTOTAL, CKMB, CKMBINDEX, TROPONINI in the last 168 hours. BNP (last 3 results) No results for input(s): PROBNP in the last 8760 hours. HbA1C: No results for input(s): HGBA1C in the last 72 hours. CBG: Recent Labs  Lab 05/08/20 1917 05/08/20 2343 05/09/20 0330 05/09/20 0748 05/09/20 1153  GLUCAP  151* 165* 156* 135* 130*   Lipid Profile: No results for input(s): CHOL, HDL, LDLCALC, TRIG, CHOLHDL, LDLDIRECT in the last 72 hours. Thyroid Function Tests: No results for input(s): TSH, T4TOTAL, FREET4, T3FREE, THYROIDAB in the last 72 hours. Anemia Panel: No results for input(s): VITAMINB12, FOLATE, FERRITIN, TIBC, IRON, RETICCTPCT in the last 72 hours. Sepsis Labs: No results for input(s): PROCALCITON, LATICACIDVEN in the last 168 hours.  Recent Results (from the past 240 hour(s))  Respiratory Panel by RT PCR (Flu A&B, Covid) - Nasopharyngeal Swab     Status: None   Collection Time: 05/05/20  6:46 PM   Specimen: Nasopharyngeal Swab  Result Value Ref Range Status   SARS Coronavirus 2 by RT PCR NEGATIVE NEGATIVE Final    Comment: (NOTE) SARS-CoV-2 target nucleic acids are NOT DETECTED.  The SARS-CoV-2 RNA is generally detectable in upper respiratoy specimens during the acute phase of infection. The lowest concentration of SARS-CoV-2 viral copies this assay can detect is 131 copies/mL. A negative result does not preclude SARS-Cov-2 infection and should not be used as the sole basis for treatment or other patient management decisions. A negative result may occur with  improper specimen collection/handling, submission of specimen other than nasopharyngeal swab, presence of viral mutation(s) within the areas targeted by this assay, and inadequate number of viral copies (<131 copies/mL). A negative result must be combined with clinical observations, patient history, and epidemiological information. The expected result is Negative.  Fact Sheet for Patients:  https://www.moore.com/  Fact Sheet for Healthcare Providers:  https://www.young.biz/  This test is no t yet approved or cleared by the [place] FDA and  has been authorized for detection and/or diagnosis of SARS-CoV-2 by FDA under an Emergency Use Authorization (EUA). This EUA will  remain  in effect (meaning this test can be used) for the duration of the COVID-19 declaration under Section 564(b)(1) of the Act, 21 U.S.C. section 360bbb-3(b)(1), unless the authorization is terminated or revoked sooner.     Influenza A by PCR NEGATIVE NEGATIVE Final   Influenza B by PCR NEGATIVE NEGATIVE Final    Comment: (NOTE) The Xpert Xpress SARS-CoV-2/FLU/RSV assay is intended as an aid in  the diagnosis of influenza from Nasopharyngeal swab specimens and  should not be used as a sole basis for treatment. Nasal washings and  aspirates are unacceptable for Xpert Xpress SARS-CoV-2/FLU/RSV  testing.  Fact Sheet for Patients: https://www.moore.com/  Fact Sheet for Healthcare Providers: https://www.young.biz/  This test is not yet approved or cleared by the [place] FDA and  has been authorized for detection and/or diagnosis of SARS-CoV-2 by  FDA under an Emergency Use Authorization (EUA). This EUA will remain  in effect (meaning this test can be used) for the duration of the  Covid-19 declaration under Section 564(b)(1) of the Act, 21  U.S.C. section 360bbb-3(b)(1), unless the authorization is  terminated or revoked. Performed at [facility], [address], [place], [place] [address]   MRSA PCR Screening     Status: None   Collection Time: 05/05/20 11:56 PM   Specimen: Nasal Mucosa; Nasopharyngeal  Result Value Ref Range Status   MRSA by PCR NEGATIVE NEGATIVE Final    Comment:        The GeneXpert MRSA Assay (FDA approved for NASAL specimens only), is one component of a comprehensive MRSA colonization surveillance program. It is not intended to diagnose MRSA infection nor to guide or monitor treatment for MRSA infections. Performed at [facility], [address], [place], [place] [address]   Culture, blood (Routine X 2) w Reflex to ID Panel     Status: None (Preliminary result)   Collection Time: 05/07/20   7:59 PM   Specimen: BLOOD LEFT HAND  Result Value Ref Range Status   Specimen Description BLOOD LEFT HAND  Final   Special Requests   Final    BOTTLES DRAWN AEROBIC AND ANAEROBIC Blood Culture adequate volume   Culture   Final    NO GROWTH 2 DAYS Performed at [facility], [address], [place], [place] [address]  Report Status PENDING  Incomplete  Culture, blood (Routine X 2) w Reflex to ID Panel     Status: None (Preliminary result)   Collection Time: 05/07/20  8:06 PM   Specimen: BLOOD RIGHT HAND  Result Value Ref Range Status   Specimen Description BLOOD RIGHT HAND  Final   Special Requests   Final    BOTTLES DRAWN AEROBIC AND ANAEROBIC Blood Culture adequate volume   Culture   Final    NO GROWTH 2 DAYS Performed at [facility], [address], [place], [place] [address]    Report Status PENDING  Incomplete  Culture, blood (routine x 2)     Status: None (Preliminary result)   Collection Time: 05/09/20  8:27 AM   Specimen: BLOOD  Result Value Ref Range Status   Specimen Description BLOOD RIGHT ANTECUBITAL  Final   Special Requests   Final    AEROBIC BOTTLE ONLY Blood Culture results may not be optimal due to an inadequate volume of blood received in culture bottles   Culture   Final    NO GROWTH < 12 HOURS Performed at [facility], [address] [place], [place] [address]    Report Status PENDING  Incomplete  Culture, blood (routine x 2)     Status: None (Preliminary result)   Collection Time: 05/09/20  8:27 AM   Specimen: BLOOD  Result Value Ref Range Status   Specimen Description BLOOD RIGHT ANTECUBITAL  Final   Special Requests   Final    AEROBIC BOTTLE ONLY Blood Culture results may not be optimal due to an inadequate volume of blood received in culture bottles   Culture   Final    NO GROWTH < 12 HOURS Performed at [facility], [address], [place], [place] [address]    Report Status PENDING  Incomplete     RN Pressure Injury  Documentation:     Estimated body mass index is 23.24 kg/m as calculated from the following:   Height as of 04/09/20: 5\' 6"  (1.676 m).   Weight as of this encounter: 65.3 kg.  Malnutrition Type:  Nutrition Problem: Inadequate oral intake Etiology: dysphagia, acute illness   Malnutrition Characteristics:  Signs/Symptoms: NPO status   Nutrition Interventions:  Interventions: Tube feeding     Radiology Studies: CT HEAD WO CONTRAST  Result Date: 05/08/2020 CLINICAL DATA:  Follow-up examination for intracranial hemorrhage. EXAM: CT HEAD WITHOUT CONTRAST TECHNIQUE: Contiguous axial images were obtained from the base of the skull through the vertex without intravenous contrast. COMPARISON:  Prior CT from 05/05/2020. FINDINGS: Brain: Examination moderately to severely degraded by motion artifact. Intraparenchymal hemorrhage centered at the left thalamus again seen, little interval changed in size and morphology as compared to previous exam. Surrounding low-density vasogenic edema with localized mass effect, similar. Associated left-to-right shift measures up to approximately 8 mm, little interval changed. Intraventricular extension with blood within the left greater than right lateral ventricles is overall similar. Associated ventricular size morphology is not significantly changed on this motion degraded exam. Scattered small volume subarachnoid hemorrhage now seen within the posterior right frontotemporal region, likely related to redistribution. No new acute intracranial hemorrhage. No acute large vessel territory infarct. No visible extra-axial fluid collection. No other new acute intracranial abnormality. Vascular: No appreciable hyperdense vessel or other abnormality on this motion degraded exam. Skull: Grossly stable allowing for motion. Sinuses/Orbits: Globes and orbital soft tissues demonstrate no acute finding. Paranasal sinuses are clear. Nasogastric tube in place.  Mastoid air cells are  clear. Other: None. IMPRESSION: 1. Moderately to severely degraded exam due to motion artifact. 2. No significant interval changed in size and morphology of left thalamic hemorrhage with similar localized mass effect and 8 mm of left-to-right midline shift. 3. Similar intraventricular extension with blood within the left greater than right lateral ventricles. Associated ventricular size morphology not significantly changed on this motion degraded exam. 4. Scattered small volume subarachnoid hemorrhage within the posterior right frontotemporal region, likely related to redistribution. 5. No other new acute intracranial abnormality. Electronically Signed   By: Rise Mu M.D.   On: 05/08/2020 01:22        Scheduled Meds: . amLODipine  10 mg Per Tube Daily  . chlorhexidine  15 mL Mouth Rinse BID  . Chlorhexidine Gluconate Cloth  6 each Topical Daily  . ferrous sulfate  300 mg Per Tube Q breakfast  . lisinopril  20 mg Per Tube BID  . mouth rinse  15 mL Mouth Rinse q12n4p  . pantoprazole sodium  40 mg Per Tube QHS  . senna-docusate  1 tablet Per Tube BID  . sodium chloride flush  10-40 mL Intracatheter Q12H   Continuous Infusions: . sodium chloride 30 mL/hr at 05/08/20 1400  . ampicillin-sulbactam (UNASYN) IV 3 g (05/09/20 1006)  . clevidipine Stopped (05/06/20 0921)  . feeding supplement (OSMOLITE 1.2 CAL) 1,000 mL (05/08/20 1954)    LOS: 4 days   Merlene Laughter, DO [facility] PAGER is on AMION  If 7PM-7AM, please contact night-coverage [email or web address]

## 2020-05-09 NOTE — Progress Notes (Signed)
RT giving neb tx at this time.  Pt fighting to remove mask.  Pt has no wheezing, no WOB and no SOB, and requiring no O2 at this time.  Neb tx likely not to help with rhonchi.  Pt needs to deep cough or be NTS to remove secretions.  Med changed to BID at this time d/t these indications based on protocol assessment.

## 2020-05-09 NOTE — Progress Notes (Signed)
Pharmacy Antibiotic Note  Alan Everett is a 64 y.o. male admitted on 05/05/2020 with aspiration PNA.  Pharmacy has been consulted for Unasyn dosing.   ID: Aspiration PNA. Tmax 101, Tc 100.8. WBC 26.7 up.Scr <1 Unasyn 10/10>>  10/8: BC x 2>> 10/6: MRSA PCR: neg   Plan: Unasyn 3g IV q6hr. Pharmacy will sign off. Please reconsult for further dosing assitance.    Weight: 65.3 kg (143 lb 15.4 oz)  Temp (24hrs), Avg:99 F (37.2 C), Min:98.3 F (36.8 C), Max:100.8 F (38.2 C)  Recent Labs  Lab 05/05/20 1835 05/05/20 1928 05/05/20 2003 05/06/20 0738 05/07/20 0337 05/08/20 1032 05/08/20 1736 05/09/20 0500  WBC   < >  --  8.8 15.9* 16.2*  --  24.1* 26.7*  CREATININE  --  0.80  --  0.97 0.81 0.79  --  0.96   < > = values in this interval not displayed.    Estimated Creatinine Clearance: 70.2 mL/min (by C-G formula based on SCr of 0.96 mg/dL).    No Known Allergies  Alan Everett S. Merilynn Finland, PharmD, BCPS Clinical Staff Pharmacist [email or web address]  Pasty Spillers 05/09/2020 8:39 AM

## 2020-05-10 ENCOUNTER — Inpatient Hospital Stay (HOSPITAL_COMMUNITY): Payer: Medicaid Other

## 2020-05-10 DIAGNOSIS — R195 Other fecal abnormalities: Secondary | ICD-10-CM

## 2020-05-10 DIAGNOSIS — D500 Iron deficiency anemia secondary to blood loss (chronic): Secondary | ICD-10-CM

## 2020-05-10 LAB — GLUCOSE, CAPILLARY
Glucose-Capillary: 136 mg/dL — ABNORMAL HIGH (ref 70–99)
Glucose-Capillary: 144 mg/dL — ABNORMAL HIGH (ref 70–99)
Glucose-Capillary: 150 mg/dL — ABNORMAL HIGH (ref 70–99)
Glucose-Capillary: 157 mg/dL — ABNORMAL HIGH (ref 70–99)
Glucose-Capillary: 164 mg/dL — ABNORMAL HIGH (ref 70–99)
Glucose-Capillary: 170 mg/dL — ABNORMAL HIGH (ref 70–99)
Glucose-Capillary: 172 mg/dL — ABNORMAL HIGH (ref 70–99)

## 2020-05-10 LAB — COMPREHENSIVE METABOLIC PANEL
ALT: 40 U/L (ref 0–44)
AST: 39 U/L (ref 15–41)
Albumin: 2.5 g/dL — ABNORMAL LOW (ref 3.5–5.0)
Alkaline Phosphatase: 63 U/L (ref 38–126)
Anion gap: 9 (ref 5–15)
BUN: 24 mg/dL — ABNORMAL HIGH (ref 8–23)
CO2: 23 mmol/L (ref 22–32)
Calcium: 8.5 mg/dL — ABNORMAL LOW (ref 8.9–10.3)
Chloride: 110 mmol/L (ref 98–111)
Creatinine, Ser: 0.9 mg/dL (ref 0.61–1.24)
GFR, Estimated: >60 mL/min (ref >60)
Glucose, Bld: 146 mg/dL — ABNORMAL HIGH (ref 70–99)
Potassium: 3.4 mmol/L — ABNORMAL LOW (ref 3.5–5.1)
Sodium: 142 mmol/L (ref 135–145)
Total Bilirubin: 0.2 mg/dL — ABNORMAL LOW (ref 0.3–1.2)
Total Protein: 7.1 g/dL (ref 6.5–8.1)

## 2020-05-10 LAB — CBC WITH DIFFERENTIAL/PLATELET
Abs Immature Granulocytes: 0.62 10*3/uL — ABNORMAL HIGH (ref 0.00–0.07)
Basophils Absolute: 0.1 10*3/uL (ref 0.0–0.1)
Basophils Relative: 0 %
Eosinophils Absolute: 0.3 10*3/uL (ref 0.0–0.5)
Eosinophils Relative: 1 %
HCT: 30.6 % — ABNORMAL LOW (ref 39.0–52.0)
Hemoglobin: 9.5 g/dL — ABNORMAL LOW (ref 13.0–17.0)
Immature Granulocytes: 2 %
Lymphocytes Relative: 6 %
Lymphs Abs: 1.6 10*3/uL (ref 0.7–4.0)
MCH: 28.7 pg (ref 26.0–34.0)
MCHC: 31 g/dL (ref 30.0–36.0)
MCV: 92.4 fL (ref 80.0–100.0)
Monocytes Absolute: 3.9 10*3/uL — ABNORMAL HIGH (ref 0.1–1.0)
Monocytes Relative: 15 %
Neutro Abs: 20 10*3/uL — ABNORMAL HIGH (ref 1.7–7.7)
Neutrophils Relative %: 76 %
Platelets: 143 10*3/uL — ABNORMAL LOW (ref 150–400)
RBC: 3.31 MIL/uL — ABNORMAL LOW (ref 4.22–5.81)
RDW: 20.7 % — ABNORMAL HIGH (ref 11.5–15.5)
WBC: 26.5 10*3/uL — ABNORMAL HIGH (ref 4.0–10.5)
nRBC: 0 % (ref 0.0–0.2)

## 2020-05-10 LAB — MAGNESIUM: Magnesium: 2.3 mg/dL (ref 1.7–2.4)

## 2020-05-10 LAB — PHOSPHORUS: Phosphorus: 2.6 mg/dL (ref 2.5–4.6)

## 2020-05-10 LAB — OCCULT BLOOD X 1 CARD TO LAB, STOOL: Fecal Occult Bld: POSITIVE — AB

## 2020-05-10 MED ORDER — PANTOPRAZOLE SODIUM 40 MG PO PACK
40.0000 mg | PACK | Freq: Every day | ORAL | Status: DC
  Administered 2020-05-11: 40 mg
  Filled 2020-05-10: qty 20

## 2020-05-10 MED ORDER — POTASSIUM CHLORIDE 20 MEQ PO PACK
40.0000 meq | PACK | Freq: Two times a day (BID) | ORAL | Status: AC
  Administered 2020-05-10 (×2): 40 meq
  Filled 2020-05-10 (×3): qty 2

## 2020-05-10 MED ORDER — PANTOPRAZOLE SODIUM 40 MG PO PACK
40.0000 mg | PACK | Freq: Two times a day (BID) | ORAL | Status: DC
  Administered 2020-05-10: 40 mg
  Filled 2020-05-10: qty 20

## 2020-05-10 NOTE — Progress Notes (Addendum)
Physical Therapy Treatment Patient Details Name: Alan Everett MRN: [id number] DOB: 1955/10/05 Today's Date: 05/10/2020    History of Present Illness Patient is a 64 y/o male with PMH ETOH use, tobacco abuse, admitted with R side weakness,sensory loss and speech deficits.  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    PT Comments    Pt sitting in liquid stool upon PT arrival. Session focused on bed mobility, rolling and attending to right side. Requires total A to roll in both directions multiple times with minimal initiation once therapist initiated movement with LUE/LE. Pt with left gaze preference. Worked on stretching cervical neck musculature to get better alignment of cervical spine towards midline. Difficult to assess cognition due to language deficits. Not following any verbal commands today.  Will continue to follow and progress as tolerated.   Follow Up Recommendations  CIR;Supervision/Assistance - 24 hour     Equipment Recommendations  Wheelchair cushion (measurements PT);Wheelchair (measurements PT);3in1 (PT)    Recommendations for Other Services       Precautions / Restrictions Precautions Precautions: Fall Precaution Comments: pushes to Rt, coretrack, BP <180 Restrictions Weight Bearing Restrictions: No    Mobility  Bed Mobility Overal bed mobility: Needs Assistance Bed Mobility: Rolling Rolling: +2 for physical assistance;Total assist         General bed mobility comments: Rolling multiple times x6 to change bed, pad and gown due to liquid stool in bed and catheter off. Able to initiate once therapist started movement with LUE/LE.  Transfers                 General transfer comment: Deferred  Ambulation/Gait                 Stairs             Wheelchair Mobility    Modified Rankin (Stroke Patients Only) Modified Rankin (Stroke Patients Only) Pre-Morbid Rankin Score:  No symptoms Modified Rankin: Severe disability     Balance                                            Cognition Arousal/Alertness: Lethargic Behavior During Therapy: Flat affect Overall Cognitive Status: Difficult to assess                                 General Comments: pt with almost no command follow. pt did state "okay" when therapist explaining to him we were going to work with him; left gaze preference with neck tightness.      Exercises      General Comments        Pertinent Vitals/Pain Pain Assessment: Faces Faces Pain Scale: No hurt    Home Living                      Prior Function            PT Goals (current goals can now be found in the care plan section) Progress towards PT goals: Not progressing toward goals - comment (due to lethargy/cognition)    Frequency    Min 3X/week      PT Plan Current plan remains appropriate    Co-evaluation              AM-PAC  PT "6 Clicks" Mobility   Outcome Measure  Help needed turning from your back to your side while in a flat bed without using bedrails?: Total Help needed moving from lying on your back to sitting on the side of a flat bed without using bedrails?: Total Help needed moving to and from a bed to a chair (including a wheelchair)?: Total Help needed standing up from a chair using your arms (e.g., wheelchair or bedside chair)?: Total Help needed to walk in hospital room?: Total   6 Click Score: 5    End of Session   Activity Tolerance: Other (comment);Patient limited by lethargy (limited by cognition/language deficits) Patient left: in bed;with call bell/phone within reach;with bed alarm set;with restraints reapplied Nurse Communication: Mobility status;Need for lift equipment;Other (comment) (re: tech needing new SCDs) PT Visit Diagnosis: Other abnormalities of gait and mobility (R26.89);Hemiplegia and hemiparesis;Other symptoms and signs  involving the nervous system (R29.898) Hemiplegia - Right/Left: Right Hemiplegia - dominant/non-dominant: Dominant Hemiplegia - caused by: Nontraumatic intracerebral hemorrhage     Time: 0952-1018 PT Time Calculation (min) (ACUTE ONLY): 26 min  Charges:  $Therapeutic Activity: 23-37 mins                     [name], PT, DPT Acute Rehabilitation Services Pager [phone] Office [phone]       Blake Divine A Lanier Ensign 05/10/2020, 12:37 PM

## 2020-05-10 NOTE — Consult Note (Signed)
[facility] Consult: 3:24 PM 05/10/2020  LOS: 5 days    Referring Provider: Dr Alfredia Ferguson Primary Care Physician:  Patient, No Pcp Per Primary Gastroenterologist:  Dr Alice Reichert in [place]  Heme: Dr Angelita Ingles.      Reason for Consultation:  Anemia, FOBT +   HPI: Alan Everett is a 64 y.o. male.  *     IDA of unclear etiology.   Followed by Dr Rogue Bussing heme/onc.  Her recommendation was for [facility] and CT abdomen pelvis but patient did not want to pay for these as he is self-pay for medical expenses. 03/01/20 EGD for IDA, chronic blood loss.  Hgb 3.5, MCV 64 >> 4 PRBCs >> 10: Mild gastritis, O/w normal study. Using frequent NSAIDs and BC powders.    03/01/20 Colonoscopy    Sigmoid diverticulosis.  O/w normal study to ICV. Started po iron 325 mg/daily but no PPI etc after admission in early 02/2020.      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 Hgb dropping.  On po iron, FOBT +.   Hgb 12.4 at arrival.  Dropped to 9.5 over last few days.   FOBT +.  Stool is loose on tube feeds.. Platelets as low as 136.   BUN rising: 8 >> 24 Ast/alt elevated to 68/64, no priors to compare.  t bili and alk phos ok.   No GI imaging.     64.8 kg on 9/10, 61.2 kg now.    Social.  Drinks up to 6 pack beer daily according to previous documentation..   Family hx: Cancer in his mother.  No IBD, no GI cancers.    Past Medical History:  Diagnosis Date  . Alcohol use   . Tobacco abuse     Past Surgical History:  Procedure Laterality Date  . COLONOSCOPY N/A 03/01/2020   Procedure: COLONOSCOPY;  Surgeon: Toledo, Benay Pike, MD;  Location: [facility] ENDOSCOPY;  Service: Gastroenterology;  Laterality: N/A;  . ESOPHAGOGASTRODUODENOSCOPY N/A 03/01/2020   Procedure: ESOPHAGOGASTRODUODENOSCOPY (EGD);  Surgeon: Toledo,  Benay Pike, MD;  Location: [facility] ENDOSCOPY;  Service: Gastroenterology;  Laterality: N/A;  . FOOT FRACTURE SURGERY Left     Prior to Admission medications   Medication Sig Start Date End Date Taking? Authorizing Provider  acetaminophen (TYLENOL) 500 MG tablet Take 500-1,000 mg by mouth every 6 (six) hours as needed for mild pain, moderate pain or fever. Patient not taking: Reported on 03/10/2020    [provider]  docusate sodium (STOOL SOFTENER) 100 MG capsule Take 100 mg by mouth 2 (two) times daily.    [provider]  Iron, Ferrous Sulfate, 325 (65 Fe) MG TABS Take 325 mg by mouth daily. 03/01/20   Sidney Ace, MD    Scheduled Meds: . amLODipine  10 mg Per Tube Daily  . chlorhexidine  15 mL Mouth Rinse BID  . Chlorhexidine Gluconate Cloth  6 each Topical Daily  . ipratropium  0.5 mg Nebulization BID  . levalbuterol  0.63 mg Nebulization BID  . lisinopril  20 mg Per Tube BID  .  mouth rinse  15 mL Mouth Rinse q12n4p  . pantoprazole sodium  40 mg Per Tube BID  . potassium chloride  40 mEq Per Tube BID  . senna-docusate  1 tablet Per Tube BID  . sodium chloride flush  10-40 mL Intracatheter Q12H   Infusions: . ampicillin-sulbactam (UNASYN) IV 3 g (05/10/20 1452)  . feeding supplement (OSMOLITE 1.2 CAL) 1,000 mL (05/10/20 1252)   PRN Meds: acetaminophen **OR** acetaminophen (TYLENOL) oral liquid 160 mg/5 mL **OR** acetaminophen, hydrALAZINE, sodium chloride flush   Allergies as of 05/05/2020  . (No Known Allergies)    Family History  Problem Relation Age of Onset  . Cancer Mother        his mother died of cancer, but patient is not sure which type of cancner.   . Diabetes Mellitus II Niece     Social History   Socioeconomic History  . Marital status: Single    Spouse name: Not on file  . Number of children: Not on file  . Years of education: Not on file  . Highest education level: Not on file  Occupational History  . Not on file  Tobacco Use   . Smoking status: Current Every Day Smoker  . Smokeless tobacco: Never Used  Substance and Sexual Activity  . Alcohol use: Yes    Alcohol/week: 6.0 standard drinks    Types: 6 Cans of beer per week  . Drug use: Never  . Sexual activity: Not on file  Other Topics Concern  . Not on file  Social History Narrative   Works part time- Systems analyst; live sin [place] with girl friend; 3-4 cig/day; beer.    Social Determinants of Health   Financial Resource Strain:   . Difficulty of Paying Living Expenses: Not on file  Food Insecurity:   . Worried About Charity fundraiser in the Last Year: Not on file  . Ran Out of Food in the Last Year: Not on file  Transportation Needs:   . Lack of Transportation (Medical): Not on file  . Lack of Transportation (Non-Medical): Not on file  Physical Activity:   . Days of Exercise per Week: Not on file  . Minutes of Exercise per Session: Not on file  Stress:   . Feeling of Stress : Not on file  Social Connections:   . Frequency of Communication with Friends and Family: Not on file  . Frequency of Social Gatherings with Friends and Family: Not on file  . Attends Religious Services: Not on file  . Active Member of Clubs or Organizations: Not on file  . Attends Archivist Meetings: Not on file  . Marital Status: Not on file  Intimate Partner Violence:   . Fear of Current or Ex-Partner: Not on file  . Emotionally Abused: Not on file  . Physically Abused: Not on file  . Sexually Abused: Not on file    REVIEW OF SYSTEMS: Constitutional: Bedbound since the stroke. ENT:  No nose bleeds Pulm: Occasional cough.  No dyspnea. CV:  No palpitations, no LE edema.  GU:  No hematuria, no frequency GI: Do not see much in the way of GI symptomatology discussed in previous notes. Heme: No reports of excessive or unusual bleeding or bruising. Transfusions: See the HPI. Neuro:  No headaches, no peripheral tingling or numbness.  No seizures, no  syncope Derm:  No itching, no rash or sores.  Endocrine:  No sweats or chills.  No polyuria or dysuria Immunization: Unknown  Travel: Unknown.   PHYSICAL EXAM: Vital signs in last 24 hours: Vitals:   05/10/20 1141 05/10/20 1511  BP: 138/81 (!) 160/76  Pulse: 98 99  Resp: 18 18  Temp: (!) 100.5 F (38.1 C) 99 F (37.2 C)  SpO2: 98% 99%   Wt Readings from Last 3 Encounters:  05/10/20 61.2 kg  04/09/20 64.8 kg  03/10/20 64.4 kg    General: Minimally responsive.  Looks ill Head: No obvious facial asymmetry. Eyes: No scleral icterus.  Conjunctiva pale Ears: Not able to assess his hearing but it does not appear to be impaired he seems to be able to hear me Nose: No congestion or discharge Mouth: Oropharynx is moist, pink, clear.  Poor dentition. Neck: No JVD, no masses, no thyromegaly Lungs: No cough.  Diminished breath sounds with occasional rhonchi.  No obvious labored breathing. Heart: RRR.  No MRG.  S1, S2 present. Abdomen: Thin, soft.  There is a firmness in the left abdomen, question column of stool vs enlarged spleen..   Rectal: Deferred Musc/Skeltl: No joint redness, swelling or gross deformity. Extremities: No CCE Neurologic: Flaccid paralysis on the right.  Expressive and receptive aphasia.  At arrival to the room patient slumped over to his left side with his head between the arm rails.  Left arm tied with soft restraint Skin: No obvious sores, rashes or suspicious lesions Nodes: No cervical adenopathy Psych: Flat affect.  Intake/Output from previous day: 10/10 0701 - 10/11 0700 In: 335 [I.V.:10; NG/GT:325] Out: 1550 [Urine:1550] Intake/Output this shift: No intake/output data recorded.  LAB RESULTS: Recent Labs    05/08/20 1736 05/09/20 0500 05/10/20 0200  WBC 24.1* 26.7* 26.5*  HGB 11.9* 10.7* 9.5*  HCT 37.9* 34.1* 30.6*  PLT 150 144* 143*   BMET Lab Results  Component Value Date   NA 142 05/10/2020   NA 141 05/09/2020   NA 138 05/08/2020   K  3.4 (L) 05/10/2020   K 3.3 (L) 05/09/2020   K 3.1 (L) 05/08/2020   CL 110 05/10/2020   CL 109 05/09/2020   CL 106 05/08/2020   CO2 23 05/10/2020   CO2 21 (L) 05/09/2020   CO2 21 (L) 05/08/2020   GLUCOSE 146 (H) 05/10/2020   GLUCOSE 150 (H) 05/09/2020   GLUCOSE 139 (H) 05/08/2020   BUN 24 (H) 05/10/2020   BUN 24 (H) 05/09/2020   BUN 16 05/08/2020   CREATININE 0.90 05/10/2020   CREATININE 0.96 05/09/2020   CREATININE 0.79 05/08/2020   CALCIUM 8.5 (L) 05/10/2020   CALCIUM 8.7 (L) 05/09/2020   CALCIUM 9.3 05/08/2020   LFT Recent Labs    05/09/20 0827 05/10/20 0200  PROT 7.5 7.1  ALBUMIN 2.8* 2.5*  AST 35 39  ALT 45* 40  ALKPHOS 70 63  BILITOT 0.5 0.2*  BILIDIR 0.2  --   IBILI 0.3  --    PT/INR Lab Results  Component Value Date   INR 1.2 05/05/2020   INR 1.1 02/28/2020   Hepatitis Panel No results for input(s): HEPBSAG, HCVAB, HEPAIGM, HEPBIGM in the last 72 hours. C-Diff No components found for: CDIFF Lipase  No results found for: LIPASE  Drugs of Abuse     Component Value Date/Time   LABOPIA NONE DETECTED 05/06/2020 0752   COCAINSCRNUR POSITIVE (A) 05/06/2020 0752   LABBENZ NONE DETECTED 05/06/2020 0752   AMPHETMU NONE DETECTED 05/06/2020 0752   THCU NONE DETECTED 05/06/2020 0752   LABBARB NONE DETECTED 05/06/2020 0752     RADIOLOGY STUDIES: DG  CHEST PORT 1 VIEW  Result Date: 05/10/2020 CLINICAL DATA:  Shortness of breath. EXAM: PORTABLE CHEST 1 VIEW COMPARISON:  May 09, 2020. FINDINGS: Stable cardiomediastinal silhouette. Feeding tube is seen entering stomach. No pneumothorax or pleural effusion is noted. Mild bibasilar opacities are noted concerning for multifocal pneumonia. Bony thorax is unremarkable. IMPRESSION: Mild bibasilar opacities are noted concerning for multifocal pneumonia. Electronically Signed   By: Marijo Conception M.D.   On: 05/10/2020 08:14   DG CHEST PORT 1 VIEW  Result Date: 05/09/2020 CLINICAL DATA:  Fever. EXAM: PORTABLE  CHEST 1 VIEW COMPARISON:  Chest radiograph 05/07/2020 FINDINGS: Enteric tube courses inferior to the diaphragm. Stable cardiac and mediastinal contours. Persistent heterogeneous opacities right lung base. New opacities left mid lung. No pleural effusion or pneumothorax. Thoracic spine degenerative changes. IMPRESSION: 1. Persistent right lower lung heterogeneous opacities which may represent atelectasis or infection. 2. New opacities left mid lung may represent atelectasis or infection. Electronically Signed   By: Lovey Newcomer M.D.   On: 05/09/2020 16:18     IMPRESSION:   *   Acute on chronic anemia.  On po iron for dx of IDA made in 02/2020.   *    Cocaine induced hemorrhagic CVA  *   Aspiration PNA.  Unasyn in place  *    Thrombocytopenia.    *   Neurogenic dysphagia.  BSS swallow eval 10/7: "Given pt's lethargy and decreased responsiveness, recommend pt remain NPO Coretrak in place, on tube feeds.      PLAN:     *  Given recent EGD and colonoscopy, no need to repeat these studies.  Consider performing capsule endoscopy but would need to place the capsule endoscopically as patient is unable to swallow the camera pill. Consider CTAP but will d/w Dr Hilarie Fredrickson.  Would need contrast VT.   Agree w decision to start PPI but only needs this 1 x daily.  Needs to fup soon w his hematologist.     Azucena Freed  05/10/2020, 3:24 PM Phone [phone]

## 2020-05-10 NOTE — Progress Notes (Signed)
Inpatient Rehab Admissions Coordinator:   Pt. Is confused today, unable to do transfers or gait with PT earlier today. I will follow for a few days and pursue CIR admit if mentation improves. If mentation does not improve,SNF may be more appropriate.   Megan Salon, MS, CCC-SLP Rehab Admissions Coordinator  [phone] (celll) [phone] (office)

## 2020-05-10 NOTE — Progress Notes (Signed)
STROKE TEAM PROGRESS NOTE   INTERVAL HISTORY Patient's fever has come down.  He is awake and alert but remains aphasic and barely follows commands.  Neurological exam is unchanged.  Vital signs are stable. Chest x-ray shows bibasilar opacities consistent with multifocal pneumonia.  He has been started on Unasyn. Vitals:   05/10/20 0500 05/10/20 0521 05/10/20 0813 05/10/20 0816  BP:   (!) 155/59   Pulse:   98   Resp:   18   Temp:  99.9 F (37.7 C) 99.2 F (37.3 C)   TempSrc:  Oral Oral   SpO2:   99% 100%  Weight: 61.2 kg      CBC:  Recent Labs  Lab 05/06/20 0738 05/07/20 0337 05/09/20 0500 05/10/20 0200  WBC 15.9*   < > 26.7* 26.5*  NEUTROABS 13.7*  --   --  20.0*  HGB 11.6*   < > 10.7* 9.5*  HCT 36.2*   < > 34.1* 30.6*  MCV 91.6   < > 90.9 92.4  PLT 154   < > 144* 143*   < > = values in this interval not displayed.   Basic Metabolic Panel:  Recent Labs  Lab 05/08/20 1736 05/09/20 0500 05/09/20 0827 05/10/20 0200  NA  --  141  --  142  K  --  3.3*  --  3.4*  CL  --  109  --  110  CO2  --  21*  --  23  GLUCOSE  --  150*  --  146*  BUN  --  24*  --  24*  CREATININE  --  0.96  --  0.90  CALCIUM  --  8.7*  --  8.5*  MG   < >  --  2.2 2.3  PHOS   < >  --  2.0* 2.6   < > = values in this interval not displayed.   Lipid Panel:  Recent Labs  Lab 05/05/20 2003  CHOL 146  TRIG 87  HDL 49  CHOLHDL 3.0  VLDL 17  LDLCALC 80   HgbA1c:  Recent Labs  Lab 05/05/20 2004  HGBA1C 4.6*   Urine Drug Screen:  Recent Labs  Lab 05/06/20 0752  LABOPIA NONE DETECTED  COCAINSCRNUR POSITIVE*  LABBENZ NONE DETECTED  AMPHETMU NONE DETECTED  THCU NONE DETECTED  LABBARB NONE DETECTED    Alcohol Level  Recent Labs  Lab 05/05/20 2003  ETH <10    IMAGING past 24 hours DG CHEST PORT 1 VIEW  Result Date: 05/10/2020 CLINICAL DATA:  Shortness of breath. EXAM: PORTABLE CHEST 1 VIEW COMPARISON:  May 09, 2020. FINDINGS: Stable cardiomediastinal silhouette. Feeding  tube is seen entering stomach. No pneumothorax or pleural effusion is noted. Mild bibasilar opacities are noted concerning for multifocal pneumonia. Bony thorax is unremarkable. IMPRESSION: Mild bibasilar opacities are noted concerning for multifocal pneumonia. Electronically Signed   By: Lupita Raider M.D.   On: 05/10/2020 08:14    PHYSICAL EXAM     Temp:  [98.6 F (37 C)-102.3 F (39.1 C)] 99.2 F (37.3 C) (10/11 0813) Pulse Rate:  [85-99] 98 (10/11 0813) Resp:  [16-20] 18 (10/11 0813) BP: (103-155)/(53-71) 155/59 (10/11 0813) SpO2:  [97 %-100 %] 100 % (10/11 0816) Weight:  [61.2 kg] 61.2 kg (10/11 0500)  General -frail middle-aged African-American male, in no apparent distress.  Ophthalmologic - fundi not visualized due to noncooperation.  Cardiovascular - Regular rhythm and rate.  Neuro - drowsy sleepy but open eyes on  voice. Global aphasia, able to make a few sounds but not words. Only followed commands of "eye opening" and "eye closing", no other commands. Not naming or repeating. Left gaze preference, abut able to cross midline. Right HH, not blinking to visual threat. PERRL, right upper and lower facial weakness. Tongue protrusion not cooperative. LUE at least 4/5 and LLE at least 3/5. However, RUE 0/5 mild withdraw to pain. RLE no spontaneous movement but withdraw to pain 3-/5. DTR 1+ and right positive babinski. Sensation, coordination not cooperative and gait not tested.   ASSESSMENT/PLAN [name] is a 64 y.o. male with history of alcohol and tobacco abuse presenting with RUE and RLE weakness, R sensory loss and mixed aphasia. SBP > 200. CT w/ L thalamocapsular IPH w/ IVH. Neuro worsening in ED w/ IVH and hydrocephalus. NS consulted for EVD consideration, which was deferred.   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 Small vessel  disease. Atrophy.   CT head 10/6 2042 enlarging L thalamocapsular IPH w/ increasing IVH from L ventricle into 3rd and 4th ventricles.  CT head 10/7 0510 stable L thalamic IPH w/ IVH w/ clearing of blood in 4th ventricle.   CTA head and neck 10/8 unremarkable angio, slight increase in midline shift and ventriculomegaly  MRI not able to perform due to no family contact to clear for metal  Repeat CT head 10/9 suboptimal but stable  2D Echo EF 60-65%  LDL 80  HgbA1c 4.6  UDS positive for cocaine  VTE prophylaxis - SCDs   No antithrombotic prior to admission, now on No antithrombotic given IPH  Therapy recommendations:  pending - ok OOB  Disposition:  pending   Keep in ICU today, consider xfer tomorrow  Cerebral Edema  CT head 10/6 2042 enlarging L thalamocapsular IPH w/ increasing IVH from L ventricle into 3rd and 4th ventricles.  Neurosurgery consulted Yetta Barre) no indication for EVD. Following.  CT head 10/7 0510 stable L thalamic IPH w/ IVH w/ clearing of blood in 4th ventricle.    CTA head and neck 10/8 unremarkable angio, slight increase in midline shift and ventriculomegaly  CT head repeat 10/9 stable  Hypertensive Emergency  Per EMS, SBP > 200   Home meds:  none  Put on Cleviprex for BP control -> now off  Hydralazine IV PRN  On norvasc 10 and lisinopril 20 bid  SBP goal < 160 . Long-term BP goal normotensive  Leukocytosis and fever  WBC 8.8->15.9->16.2-26.7  Tmax afebrile -> 101.3  UA neg  CXR mild cardiomegaly w/ mild central congestion, suspicion mild interstitial edema   Blood culture pending  Hyperlipidemia  Home meds:  None  LDL 80, goal < 70  No statin in setting of IPH  Mild transaminitis, likely related to alcohol, AST/ALP 68/64->52/47  Consider statin low dose on discharge with normalized liver enzymes  Dysphagia . Secondary to stroke . NPO . On IVF @ 30 . cortrak placed . On TF @ 65 . Speech on board   Tobacco  abuse  Current smoker  Smoking cessation counseling will be provided  Cocaine abuse  UDS positive for cocaine  Cocaine cessation will be provided  Avoid beta-blocker  Other Stroke Risk Factors  ETOH use, alcohol level <10, advised to drink no more than 2 drink(s) a day  Other Active Problems Fever and leukocytes  likely from Aspiration Pneumonia Unasyn started 05/09/2020   Hospital day # 5 Continue Unasyn for aspiration pneumonia.  Continue ongoing therapies.  Transfer to rehabilitation over the next few days when medically stable.  Discussed with Dr. Kateri Mc.  Greater than 50% time during this 25-minute visit was spent on counseling and coordination of care about his intracerebral hemorrhage and discussion with care team.   Delia Heady, MD   To contact Stroke Continuity provider, please refer to [email or web address]. After hours, contact General Neurology

## 2020-05-10 NOTE — Progress Notes (Signed)
RT at bedside attempting delivery of nebulizer.  Pt agitated and pulling mask off and dumped out medication.  BBS Clear pt SPO2 100% on RA.

## 2020-05-10 NOTE — Progress Notes (Signed)
PROGRESS NOTE    Alan Everett  JJO:[id number] DOB: May 09, 1956 DOA: 05/05/2020 PCP: Patient, No Pcp Per   Brief Narrative:  HPI per [name] on 05/05/20 Alan Everett is an 64 y.o. male with a PMHx of alcohol use and tobacco abuse who presents acutely to the ED via EMS as a Code Stroke for acute onset of right sided weakness. He denies headache but acknowledges that he has sensory loss and weakness involving his RUE and RLE. He states that he is weak on the right, His speech pattern is most consistent with a mixed receptive and expressive dysphasia. 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   CT head: 1. 2.3 x 2.1 x 2.9 cm acute intraparenchymal hemorrhage centered at the left thalamic capsular region, estimated volume 7 cc. Minimal surrounding vasogenic edema without significant regional mass effect or midline shift. Associated intraventricular extension with small volume intraventricular blood within the adjacent left lateral ventricle. No hydrocephalus or ventricular trapping. 2. No other acute intracranial abnormality. 3. Underlying age-related cerebral atrophy with mild chronic small vessel ischemic disease.  ICH score: 1  **Interim History Patient's blood pressure was significantly elevated.  CT showed a left forearm mass R IPH with IVH.  Neurosurgery was consulted for EVD consideration which was then deferred.  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  He currently underwent stroke work-up and has been transitioned to [facility] service given that he is now out of the ICU.  Yesterday he spiked a worsening temperature and his WBC is worsening so we started the patient on IV Unasyn.  PT OT recommending CIR.  Patient is not very responsive and continues to have global aphasia and does not really follow commands.  05/10/2020: He was little bit more awake today but remains aphasic and does not follow commands.  Fever curve is trending down some but  still remains febrile WBC remains significantly elevated but minimally improved.  We will continue IV antibiotics and likely he has been aspirating so we will continue after treatment for at least 7 days total.  Attempts were made to give him his nebulizer treatment but he was agitated and started pulling off his mask and dumped out the nebulized medication.  SLP recommending continuing n.p.o. and PT OT recommending CIR  Assessment & Plan:   Active Problems:   ICH (intracerebral hemorrhage) (HCC)   SIRS (systemic inflammatory response syndrome) (HCC)   Polysubstance abuse (HCC)   Dysphagia, post-stroke   Hyperglycemia   Hypokalemia   Hypomagnesemia  Acute Hemorrhagic Stroke - L BG ICH w/ IVH d/t severe hypertension in setting of alcohol and cocaine use -Code Stroke CT head 10/6 1844 L thalamocapsular IPH 7cc w/ minimal edema, mild IVH. Small vessel disease. Atrophy.  -CT head 10/6 2042 enlarging L thalamocapsular IPH w/ increasing IVH from L ventricle into 3rd and 4th ventricles. -CT head 10/7 0510 stable L thalamic IPH w/ IVH w/ clearing of blood in 4th ventricle.  -CTA head and neck 10/8 unremarkable angio, slight increase in midline shift and ventriculomegaly -MRI not able to perform due to no family contact to clear for metal -Repeat CT head 10/9 suboptimal but stable -2D Echo EF 60-65% -LDL 80 -HgbA1c 4.6 -UDS positive for cocaine -VTE prophylaxis - SCDs  -No antithrombotic prior to admission, now on No antithrombotic given IPH -Therapy recommendations:  CIR vs SNF - ok OOB -Management per Neurology and they feel that his exam is essentially unchanged and recommending CIR when medically  stable for discharge  Cerebral Edema -CT head 10/6 2042 enlarging L thalamocapsular IPH w/ increasing IVH from L ventricle into 3rd and 4th ventricles. -Neurosurgery Dr. Yetta Barre consulted no indication for EVD. Following. -CT head 10/7 0510 stable L thalamic IPH w/ IVH w/ clearing of blood in 4th  ventricle.   -CTA head and neck 10/8 unremarkable angio, slight increase in midline shift and ventriculomegaly -CT head repeat 10/9 stable -Further Care per Neurology and appreciate further recommendations  Hypertensive Emergency -Per EMS, SBP > 200  -Home meds:  none -Weaned off ofPut on Cleviprex for BP control -> now off -Hydralazine IV PRN -Now On Amlodipine 10 and lisinopril 20 bid -SBP goal < 160 -Long-term BP goal normotensive; blood pressure was 138/81 -Continue to monitor blood pressures per protocol  Hyperlipidemia -Home meds:  None -LDL 80, goal < 70 -No statin in setting of IPH -Mild transaminitis, likely related to alcohol, AST/ALT 68/64->52/47 -> 35/45 Respectively -Will Consider statin low dose on discharge with normalized liver enzymes and defer to Neurology to initiate   Dysphagia -Secondary to stroke -C/w NPO; Will need SLP to Follow -On IVF with NS at 30 mL/hr which will now be discontinued -Cortrak place -On TF @ 65 -Continue to Monitor Progress and SLP recommends n.p.o. still   Tobacco Abuse -Patient is a current smoker and will need smoking cessation counseling given once he is more awake  Cocaine abuse -UDS positive for cocaine -Cocaine cessation will be provided when he is more awake -Avoid beta-blocker  Hypophosphatemia -Patient's Phos Level was 2.0 and repeat this a.m. was 2.6 -Replete with IV K Phos 20 mmol yesterday -Continue to Monitor and Replete as Necessary -Repeat Phos Level in the AM  Hypokalemia -Patient's K+ this AM was 3.4 -Replete with po KCl 40 mEQ BID x2 per PEG -Continue to Monitor and Replete as Necessary -Repeat CMP in the AM   Normocytic Anemia -Patient's Hgb/Hct trending down from 12.0/38.3 -> 11.9/37.9 -> 10.7/34.1 and is trending down to 9.5/30.6 today -He is having black stools so will stop Iron Supplementation; Check FOBT and was positive -In the setting of Dilution initially but now concerned for ?GIB -Was on  Ferrous Sulfate 300 mg per Tube Daily But will stop -Check Anemia Panel in the AM -Continue to Monitor for S/Sx of Bleeding; Currently having black stools -Will discuss with Gastroenterology  -Repeat CBC in the AM   SIRS with Leukocytosis and Fever in the setting of Aspiration Pneumonia  -Likely in the setting of aspiration pneumonia -We will panculture and obtain blood cultures x2, urinalysis and urine culture, and repeat chest x-ray -urinalysis is relatively unremarkable and showed a hazy appearance with small hemoglobin 11 dipstick, present amorphous cells, rare bacteria, 0-5 RBCs per high-power field, and 6-10 WBCs -Urine culture blood cultures x2 pending -Chest x-ray yesterday showed "Persistent right lower lung heterogeneous opacities which may represent atelectasis or infection. New opacities left mid lung may represent atelectasis or infection." -CXR today showed "Mild bibasilar opacities are noted concerning for multifocal pneumonia." -Elevate head of the bed greater than 30 degrees  -Add breathing Treatments with Scheduled Xopenex/Atrovent -WBC has been worsening in the last few days and has gone from 16.2 -> 24.1 -> 26.7 -> 26.5 -Empirically started IV Unasyn given his concern for aspiration from his stroke -Continue with acetaminophen for fever -Continue monitor temperature curve and WBCs and repeat both in a.m.  GERD -C/w Pantorpazole 40 mg per Tube Daily qHS  Thrombocytopenia -Mild as patient's platelet count  went from 150,000 and is now 143,000 -Continueto monitor for signs and symptoms bleeding; currently no overt bleeding  -Repeat CBC in the AM.  Hyperglycemia  -Likely reactive on tube feedings and his hemoglobin A1c is 4.6 -We will continue monitor CBGs carefully and ranging from 121-172 on BMP/CMP -If necessary will place on sensitive NovoLog sliding scale insulin every 4h  DVT prophylaxis: SCDs Code Status: FULL CODE Family Communication: No family present at  bedside Disposition Plan: Need further clinical improvement back to baseline and anticipate discharge to CIR versus SNF once medically stable  Status is: Inpatient  Remains inpatient appropriate because:Altered mental status, Unsafe d/c plan, IV treatments appropriate due to intensity of illness or inability to take PO and Inpatient level of care appropriate due to severity of illness   Dispo: The patient is from: Home              Anticipated d/c is to: CIR vs. SNF              Anticipated d/c date is: 3 days              Patient currently is not medically stable to d/c.  Consultants:   Neurology  Gastroenterology  Procedures:  ECHOCARDIOGRAM IMPRESSIONS    1. Left ventricular ejection fraction, by estimation, is 60 to 65%. The  left ventricle has normal function. The left ventricle has no regional  wall motion abnormalities. Left ventricular diastolic parameters are  consistent with Grade I diastolic  dysfunction (impaired relaxation).  2. Right ventricular systolic function is normal. The right ventricular  size is normal. Tricuspid regurgitation signal is inadequate for assessing  PA pressure.  3. The mitral valve is normal in structure. No evidence of mitral valve  regurgitation. No evidence of mitral stenosis.  4. The aortic valve is tricuspid. Aortic valve regurgitation is not  visualized. No aortic stenosis is present.  5. The inferior vena cava is normal in size with greater than 50%  respiratory variability, suggesting right atrial pressure of 3 mmHg.   FINDINGS  Left Ventricle: Left ventricular ejection fraction, by estimation, is 60  to 65%. The left ventricle has normal function. The left ventricle has no  regional wall motion abnormalities. The left ventricular internal cavity  size was normal in size. There is  no left ventricular hypertrophy. Left ventricular diastolic parameters  are consistent with Grade I diastolic dysfunction (impaired  relaxation).   Right Ventricle: The right ventricular size is normal. No increase in  right ventricular wall thickness. Right ventricular systolic function is  normal. Tricuspid regurgitation signal is inadequate for assessing PA  pressure.   Left Atrium: Left atrial size was normal in size.   Right Atrium: Right atrial size was normal in size.   Pericardium: There is no evidence of pericardial effusion.   Mitral Valve: The mitral valve is normal in structure. No evidence of  mitral valve regurgitation. No evidence of mitral valve stenosis.   Tricuspid Valve: The tricuspid valve is normal in structure. Tricuspid  valve regurgitation is not demonstrated.   Aortic Valve: The aortic valve is tricuspid. Aortic valve regurgitation is  not visualized. No aortic stenosis is present.   Pulmonic Valve: The pulmonic valve was normal in structure. Pulmonic valve  regurgitation is not visualized.   Aorta: The aortic root is normal in size and structure.   Venous: The inferior vena cava is normal in size with greater than 50%  respiratory variability, suggesting right atrial pressure of 3  mmHg.   IAS/Shunts: No atrial level shunt detected by color flow Doppler.     LEFT VENTRICLE  PLAX 2D  LVIDd:     4.30 cm Diastology  LVIDs:     2.70 cm LV e' medial:  8.49 cm/s  LV PW:     1.00 cm LV E/e' medial: 9.2  LV IVS:    0.90 cm LV e' lateral:  7.46 cm/s  LVOT diam:   2.20 cm LV E/e' lateral: 10.4  LV SV:     98  LV SV Index:  57  LVOT Area:   3.80 cm     RIGHT VENTRICLE  RV S prime:   13.90 cm/s  TAPSE (M-mode): 2.0 cm   LEFT ATRIUM       Index    RIGHT ATRIUM      Index  LA diam:    3.10 cm 1.78 cm/m RA Area:   11.60 cm  LA Vol (A2C):  40.9 ml 23.49 ml/m RA Volume:  24.60 ml 14.13 ml/m  LA Vol (A4C):  37.9 ml 21.77 ml/m  LA Biplane Vol: 43.0 ml 24.69 ml/m  AORTIC VALVE  LVOT Vmax:  120.00 cm/s  LVOT Vmean:  75.400 cm/s  LVOT VTI:  0.259 m    AORTA  Ao Root diam: 3.20 cm   MITRAL VALVE  MV Area (PHT): 2.80 cm  SHUNTS  MV Decel Time: 271 msec  Systemic VTI: 0.26 m  MV E velocity: 77.70 cm/s Systemic Diam: 2.20 cm  MV A velocity: 84.50 cm/s  MV E/A ratio: 0.92   Antimicrobials:  Anti-infectives (From admission, onward)   Start     Dose/Rate Route Frequency Ordered Stop   05/09/20 0930  Ampicillin-Sulbactam (UNASYN) 3 g in sodium chloride 0.9 % 100 mL IVPB  Status:  Discontinued        3 g 200 mL/hr over 30 Minutes Intravenous Every 8 hours 05/09/20 0837 05/09/20 0839   05/09/20 0930  Ampicillin-Sulbactam (UNASYN) 3 g in sodium chloride 0.9 % 100 mL IVPB        3 g 200 mL/hr over 30 Minutes Intravenous Every 6 hours 05/09/20 0839          Subjective: Seen and examined remains encephalopathic in was more awake but still aphasic and does not follow commands.  Fever curve is improved and he was minimally febrile today.  Respiratory status appears relatively stable.  Nursing reported that he started having some black stools and this is FOBT positive.  We will stop his iron supplementation and have GI weigh in given that his hemoglobin has dropped some.  Objective: Vitals:   05/10/20 0521 05/10/20 0813 05/10/20 0816 05/10/20 1141  BP:  (!) 155/59  138/81  Pulse:  98  98  Resp:  18  18  Temp: 99.9 F (37.7 C) 99.2 F (37.3 C)  (!) 100.5 F (38.1 C)  TempSrc: Oral Oral  Axillary  SpO2:  99% 100% 98%  Weight:        Intake/Output Summary (Last 24 hours) at 05/10/2020 1445 Last data filed at 05/10/2020 1610 Gross per 24 hour  Intake 325 ml  Output 1550 ml  Net -1225 ml   Filed Weights   05/08/20 0500 05/09/20 0330 05/10/20 0500  Weight: 65.5 kg 65.3 kg 61.2 kg   Examination: Physical Exam:  Constitutional: Patient is a thin chronically ill-appearing African-American male who is still encephalopathic but more awake today and does not really follow commands and  remains aphasic.  Eyes: Continues to have a left gaze preference ENMT: External Ears, Nose appear normal. Neck: Appears normal, supple, no cervical masses, normal ROM, no appreciable thyromegaly; no JVD Respiratory: Diminished to auscultation bilaterally with coarse breath sounds and some rhonchi bilaterally; no appreciable wheezing, rales or crackles noted.. Normal respiratory effort and patient is not tachypenic. No accessory muscle use.  Unlabored breathing Cardiovascular: RRR, no murmurs / rubs / gallops. S1 and S2 auscultated.  Trace extremity edema Abdomen: Soft, non-tender, non-distended. bowel sounds positive.  GU: Deferred. Musculoskeletal: No clubbing / cyanosis of digits/nails. No joint deformity upper and lower extremities. Skin: No rashes, lesions, ulcers on limited skin evaluation. No induration; Warm and dry.  Neurologic: Does not really follow commands and it remains aphasic Psychiatric: Impaired judgment and insight.  He is not alert and oriented x 3.  Slightly agitated  Data Reviewed: I have personally reviewed following labs and imaging studies  CBC: Recent Labs  Lab 05/05/20 1835 05/05/20 1928 05/05/20 2003 05/05/20 2003 05/06/20 0738 05/07/20 0337 05/08/20 1736 05/09/20 0500 05/10/20 0200  WBC 3.0*   < > 8.8   < > 15.9* 16.2* 24.1* 26.7* 26.5*  NEUTROABS 1.6*  --  6.6  --  13.7*  --   --   --  20.0*  HGB 12.4*   < > 12.3*   < > 11.6* 12.0* 11.9* 10.7* 9.5*  HCT 40.8   < > 40.8   < > 36.2* 38.3* 37.9* 34.1* 30.6*  MCV 92.9   < > 92.7   < > 91.6 92.1 92.0 90.9 92.4  PLT 6*   < > 149*   < > 154 136* 150 144* 143*   < > = values in this interval not displayed.   Basic Metabolic Panel: Recent Labs  Lab 05/06/20 0738 05/07/20 0337 05/07/20 1012 05/07/20 1931 05/08/20 1032 05/08/20 1736 05/09/20 0500 05/09/20 0827 05/10/20 0200  NA 137 136  --   --  138  --  141  --  142  K 3.9 3.4*  --   --  3.1*  --  3.3*  --  3.4*  CL 106 104  --   --  106  --  109   --  110  CO2 20* 21*  --   --  21*  --  21*  --  23  GLUCOSE 151* 166*  --   --  139*  --  150*  --  146*  BUN 10 9  --   --  16  --  24*  --  24*  CREATININE 0.97 0.81  --   --  0.79  --  0.96  --  0.90  CALCIUM 9.2 9.5  --   --  9.3  --  8.7*  --  8.5*  MG  --   --    < > 1.6* 1.9 1.9  --  2.2 2.3  PHOS  --   --    < > 2.6 2.0* 1.5*  --  2.0* 2.6   < > = values in this interval not displayed.   GFR: Estimated Creatinine Clearance: 71.8 mL/min (by C-G formula based on SCr of 0.9 mg/dL). Liver Function Tests: Recent Labs  Lab 05/05/20 1835 05/06/20 0738 05/09/20 0827 05/10/20 0200  AST 68* 52* 35 39  ALT 64* 47* 45* 40  ALKPHOS 90 73 70 63  BILITOT 0.8 0.6 0.5 0.2*  PROT 8.6* 7.6 7.5 7.1  ALBUMIN 3.9 3.5 2.8* 2.5*   No  results for input(s): LIPASE, AMYLASE in the last 168 hours. No results for input(s): AMMONIA in the last 168 hours. Coagulation Profile: Recent Labs  Lab 05/05/20 1930  INR 1.2   Cardiac Enzymes: No results for input(s): CKTOTAL, CKMB, CKMBINDEX, TROPONINI in the last 168 hours. BNP (last 3 results) No results for input(s): PROBNP in the last 8760 hours. HbA1C: No results for input(s): HGBA1C in the last 72 hours. CBG: Recent Labs  Lab 05/09/20 1954 05/10/20 0022 05/10/20 0330 05/10/20 0839 05/10/20 1210  GLUCAP 155* 136* 144* 172* 150*   Lipid Profile: No results for input(s): CHOL, HDL, LDLCALC, TRIG, CHOLHDL, LDLDIRECT in the last 72 hours. Thyroid Function Tests: No results for input(s): TSH, T4TOTAL, FREET4, T3FREE, THYROIDAB in the last 72 hours. Anemia Panel: No results for input(s): VITAMINB12, FOLATE, FERRITIN, TIBC, IRON, RETICCTPCT in the last 72 hours. Sepsis Labs: No results for input(s): PROCALCITON, LATICACIDVEN in the last 168 hours.  Recent Results (from the past 240 hour(s))  Respiratory Panel by RT PCR (Flu A&B, Covid) - Nasopharyngeal Swab     Status: None   Collection Time: 05/05/20  6:46 PM   Specimen: Nasopharyngeal  Swab  Result Value Ref Range Status   SARS Coronavirus 2 by RT PCR NEGATIVE NEGATIVE Final    Comment: (NOTE) SARS-CoV-2 target nucleic acids are NOT DETECTED.  The SARS-CoV-2 RNA is generally detectable in upper respiratoy specimens during the acute phase of infection. The lowest concentration of SARS-CoV-2 viral copies this assay can detect is 131 copies/mL. A negative result does not preclude SARS-Cov-2 infection and should not be used as the sole basis for treatment or other patient management decisions. A negative result may occur with  improper specimen collection/handling, submission of specimen other than nasopharyngeal swab, presence of viral mutation(s) within the areas targeted by this assay, and inadequate number of viral copies (<131 copies/mL). A negative result must be combined with clinical observations, patient history, and epidemiological information. The expected result is Negative.  Fact Sheet for Patients:  https://www.moore.com/https://www.fda.gov/media/142436/download  Fact Sheet for Healthcare Providers:  https://www.young.biz/https://www.fda.gov/media/142435/download  This test is no t yet approved or cleared by the [place] FDA and  has been authorized for detection and/or diagnosis of SARS-CoV-2 by FDA under an Emergency Use Authorization (EUA). This EUA will remain  in effect (meaning this test can be used) for the duration of the COVID-19 declaration under Section 564(b)(1) of the Act, 21 U.S.C. section 360bbb-3(b)(1), unless the authorization is terminated or revoked sooner.     Influenza A by PCR NEGATIVE NEGATIVE Final   Influenza B by PCR NEGATIVE NEGATIVE Final    Comment: (NOTE) The Xpert Xpress SARS-CoV-2/FLU/RSV assay is intended as an aid in  the diagnosis of influenza from Nasopharyngeal swab specimens and  should not be used as a sole basis for treatment. Nasal washings and  aspirates are unacceptable for Xpert Xpress SARS-CoV-2/FLU/RSV  testing.  Fact Sheet for  Patients: https://www.moore.com/https://www.fda.gov/media/142436/download  Fact Sheet for Healthcare Providers: https://www.young.biz/https://www.fda.gov/media/142435/download  This test is not yet approved or cleared by the [place] FDA and  has been authorized for detection and/or diagnosis of SARS-CoV-2 by  FDA under an Emergency Use Authorization (EUA). This EUA will remain  in effect (meaning this test can be used) for the duration of the  Covid-19 declaration under Section 564(b)(1) of the Act, 21  U.S.C. section 360bbb-3(b)(1), unless the authorization is  terminated or revoked. Performed at [facility], [address] [place], [place] [address]  MRSA PCR Screening     Status: None   Collection Time: 05/05/20 11:56 PM   Specimen: Nasal Mucosa; Nasopharyngeal  Result Value Ref Range Status   MRSA by PCR NEGATIVE NEGATIVE Final    Comment:        The GeneXpert MRSA Assay (FDA approved for NASAL specimens only), is one component of a comprehensive MRSA colonization surveillance program. It is not intended to diagnose MRSA infection nor to guide or monitor treatment for MRSA infections. Performed at [facility], [address] [place], [place] [address]   Culture, blood (Routine X 2) w Reflex to ID Panel     Status: None (Preliminary result)   Collection Time: 05/07/20  7:59 PM   Specimen: BLOOD LEFT HAND  Result Value Ref Range Status   Specimen Description BLOOD LEFT HAND  Final   Special Requests   Final    BOTTLES DRAWN AEROBIC AND ANAEROBIC Blood Culture adequate volume   Culture   Final    NO GROWTH 3 DAYS Performed at [facility], [address] [place], [place] [address]    Report Status PENDING  Incomplete  Culture, blood (Routine X 2) w Reflex to ID Panel     Status: None (Preliminary result)   Collection Time: 05/07/20  8:06 PM   Specimen: BLOOD RIGHT HAND  Result Value Ref Range Status   Specimen Description BLOOD RIGHT HAND  Final   Special Requests   Final     BOTTLES DRAWN AEROBIC AND ANAEROBIC Blood Culture adequate volume   Culture   Final    NO GROWTH 3 DAYS Performed at [facility], [address] [place], [place] [address]    Report Status PENDING  Incomplete  Culture, blood (routine x 2)     Status: None (Preliminary result)   Collection Time: 05/09/20  8:27 AM   Specimen: BLOOD  Result Value Ref Range Status   Specimen Description BLOOD RIGHT ANTECUBITAL  Final   Special Requests   Final    AEROBIC BOTTLE ONLY Blood Culture results may not be optimal due to an inadequate volume of blood received in culture bottles   Culture   Final    NO GROWTH < 24 HOURS Performed at [facility], [address] [place], [place] [address]    Report Status PENDING  Incomplete  Culture, blood (routine x 2)     Status: None (Preliminary result)   Collection Time: 05/09/20  8:27 AM   Specimen: BLOOD  Result Value Ref Range Status   Specimen Description BLOOD RIGHT ANTECUBITAL  Final   Special Requests   Final    AEROBIC BOTTLE ONLY Blood Culture results may not be optimal due to an inadequate volume of blood received in culture bottles   Culture   Final    NO GROWTH < 24 HOURS Performed at [facility], [address] [place], [place] [address]    Report Status PENDING  Incomplete  Culture, Urine     Status: Abnormal (Preliminary result)   Collection Time: 05/09/20  8:40 AM   Specimen: Urine, Random  Result Value Ref Range Status   Specimen Description URINE, RANDOM  Final   Special Requests NONE  Final   Culture (A)  Final    >=100,000 COLONIES/mL KLEBSIELLA PNEUMONIAE CULTURE REINCUBATED FOR BETTER GROWTH SUSCEPTIBILITIES TO FOLLOW Performed at [facility], [address], [place], [place] [address]    Report Status PENDING  Incomplete     RN Pressure Injury Documentation:     Estimated body mass index is 21.78 kg/m as calculated from the following:   Height as of 04/09/20: 5\' 6"  (1.676 m).   Weight as  of this encounter: 61.2 kg.  Malnutrition Type:  Nutrition Problem: Inadequate oral intake Etiology: dysphagia, acute illness   Malnutrition Characteristics:  Signs/Symptoms: NPO status   Nutrition Interventions:  Interventions: Tube feeding     Radiology Studies: DG CHEST PORT 1 VIEW  Result Date: 05/10/2020 CLINICAL DATA:  Shortness of breath. EXAM: PORTABLE CHEST 1 VIEW COMPARISON:  May 09, 2020. FINDINGS: Stable cardiomediastinal silhouette. Feeding tube is seen entering stomach. No pneumothorax or pleural effusion is noted. Mild bibasilar opacities are noted concerning for multifocal pneumonia. Bony thorax is unremarkable. IMPRESSION: Mild bibasilar opacities are noted concerning for multifocal pneumonia. Electronically Signed   By: Lupita Raider M.D.   On: 05/10/2020 08:14   DG CHEST PORT 1 VIEW  Result Date: 05/09/2020 CLINICAL DATA:  Fever. EXAM: PORTABLE CHEST 1 VIEW COMPARISON:  Chest radiograph 05/07/2020 FINDINGS: Enteric tube courses inferior to the diaphragm. Stable cardiac and mediastinal contours. Persistent heterogeneous opacities right lung base. New opacities left mid lung. No pleural effusion or pneumothorax. Thoracic spine degenerative changes. IMPRESSION: 1. Persistent right lower lung heterogeneous opacities which may represent atelectasis or infection. 2. New opacities left mid lung may represent atelectasis or infection. Electronically Signed   By: Annia Belt M.D.   On: 05/09/2020 16:18   Scheduled Meds:  amLODipine  10 mg Per Tube Daily   chlorhexidine  15 mL Mouth Rinse BID   Chlorhexidine Gluconate Cloth  6 each Topical Daily   ipratropium  0.5 mg Nebulization BID   levalbuterol  0.63 mg Nebulization BID   lisinopril  20 mg Per Tube BID   mouth rinse  15 mL Mouth Rinse q12n4p   pantoprazole sodium  40 mg Per Tube BID   potassium chloride  40 mEq Per Tube BID   senna-docusate  1 tablet Per Tube BID   sodium chloride flush  10-40 mL  Intracatheter Q12H   Continuous Infusions:  sodium chloride 30 mL/hr at 05/08/20 1400   ampicillin-sulbactam (UNASYN) IV 3 g (05/10/20 0834)   feeding supplement (OSMOLITE 1.2 CAL) 1,000 mL (05/10/20 1252)    LOS: 5 days   Merlene Laughter, DO [facility] PAGER is on AMION  If 7PM-7AM, please contact night-coverage [email or web address]

## 2020-05-10 NOTE — Progress Notes (Signed)
  Speech Language Pathology Treatment: Dysphagia  Patient Details Name: Kemoni Ortega MRN: [id number] DOB: Apr 29, 1956 Today's Date: 05/10/2020 Time: 6222-9798 SLP Time Calculation (min) (ACUTE ONLY): 14 min  Assessment / Plan / Recommendation Clinical Impression  Pt was drowsy today, and now with concern for PNA. He needed continuous stimulation to maintain alertness. Oral care was provided with saliva also suctioned from his mouth, as it pooled in his L buccal cavity (the side to which he was leaning). Attempted ice chips with Max cues for alertness and oral preparation, but pt could not sustain attention long enough, nor did he initiate swallow. All boluses were suctioned from his mouth. Given mentation and concern for acute infection, no further trials were attempted. Would maintain NPO status with frequent and thorough oral care.    HPI HPI: Jantzen Pilger is a 64 y.o. male with a PMHx of alcohol use and tobacco abuse who presents acutely to the ED via EMS as a Code Stroke for acute onset of right sided weakness. Patient with disoriented, right leg weakness, right limb ataxia, right decreased sensation and Global aphasia on exam. 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 Worsening of intraventricular      SLP Plan          Recommendations  Diet recommendations: NPO Medication Administration: Via alternative means                Oral Care Recommendations: Oral care QID Follow up Recommendations: Inpatient Rehab SLP Visit Diagnosis: Dysphagia, unspecified (R13.10)       GO                Mahala Menghini., M.A. CCC-SLP Acute Rehabilitation Services Pager [phone] Office [phone]  05/10/2020, 12:16 PM

## 2020-05-11 ENCOUNTER — Inpatient Hospital Stay (HOSPITAL_COMMUNITY): Payer: Medicaid Other

## 2020-05-11 LAB — URINE CULTURE: Culture: >=100000 — AB

## 2020-05-11 LAB — PHOSPHORUS: Phosphorus: 3 mg/dL (ref 2.5–4.6)

## 2020-05-11 LAB — COMPREHENSIVE METABOLIC PANEL
ALT: 78 U/L — ABNORMAL HIGH (ref 0–44)
AST: 90 U/L — ABNORMAL HIGH (ref 15–41)
Albumin: 2.6 g/dL — ABNORMAL LOW (ref 3.5–5.0)
Alkaline Phosphatase: 71 U/L (ref 38–126)
Anion gap: 11 (ref 5–15)
BUN: 21 mg/dL (ref 8–23)
CO2: 19 mmol/L — ABNORMAL LOW (ref 22–32)
Calcium: 8.9 mg/dL (ref 8.9–10.3)
Chloride: 116 mmol/L — ABNORMAL HIGH (ref 98–111)
Creatinine, Ser: 0.91 mg/dL (ref 0.61–1.24)
GFR, Estimated: >60 mL/min (ref >60)
Glucose, Bld: 157 mg/dL — ABNORMAL HIGH (ref 70–99)
Potassium: 4.1 mmol/L (ref 3.5–5.1)
Sodium: 146 mmol/L — ABNORMAL HIGH (ref 135–145)
Total Bilirubin: 0.7 mg/dL (ref 0.3–1.2)
Total Protein: 7.8 g/dL (ref 6.5–8.1)

## 2020-05-11 LAB — CBC WITH DIFFERENTIAL/PLATELET
Abs Immature Granulocytes: 1.2 10*3/uL — ABNORMAL HIGH (ref 0.00–0.07)
Basophils Absolute: 0.1 10*3/uL (ref 0.0–0.1)
Basophils Relative: 1 %
Eosinophils Absolute: 0.2 10*3/uL (ref 0.0–0.5)
Eosinophils Relative: 1 %
HCT: 28.4 % — ABNORMAL LOW (ref 39.0–52.0)
Hemoglobin: 8.7 g/dL — ABNORMAL LOW (ref 13.0–17.0)
Immature Granulocytes: 4 %
Lymphocytes Relative: 6 %
Lymphs Abs: 1.7 10*3/uL (ref 0.7–4.0)
MCH: 28.7 pg (ref 26.0–34.0)
MCHC: 30.6 g/dL (ref 30.0–36.0)
MCV: 93.7 fL (ref 80.0–100.0)
Monocytes Absolute: 4.2 10*3/uL — ABNORMAL HIGH (ref 0.1–1.0)
Monocytes Relative: 14 %
Neutro Abs: 21.9 10*3/uL — ABNORMAL HIGH (ref 1.7–7.7)
Neutrophils Relative %: 74 %
Platelets: 177 10*3/uL (ref 150–400)
RBC: 3.03 MIL/uL — ABNORMAL LOW (ref 4.22–5.81)
RDW: 20.4 % — ABNORMAL HIGH (ref 11.5–15.5)
WBC: 29.3 10*3/uL — ABNORMAL HIGH (ref 4.0–10.5)
nRBC: 0.1 % (ref 0.0–0.2)

## 2020-05-11 LAB — GLUCOSE, CAPILLARY
Glucose-Capillary: 169 mg/dL — ABNORMAL HIGH (ref 70–99)
Glucose-Capillary: 157 mg/dL — ABNORMAL HIGH (ref 70–99)
Glucose-Capillary: 213 mg/dL — ABNORMAL HIGH (ref 70–99)
Glucose-Capillary: 187 mg/dL — ABNORMAL HIGH (ref 70–99)
Glucose-Capillary: 100 mg/dL — ABNORMAL HIGH (ref 70–99)
Glucose-Capillary: 204 mg/dL — ABNORMAL HIGH (ref 70–99)
Glucose-Capillary: 197 mg/dL — ABNORMAL HIGH (ref 70–99)

## 2020-05-11 LAB — VITAMIN B12: Vitamin B-12: 377 pg/mL (ref 180–914)

## 2020-05-11 LAB — IRON AND TIBC
Iron: 39 ug/dL — ABNORMAL LOW (ref 45–182)
Saturation Ratios: 14 % — ABNORMAL LOW (ref 17.9–39.5)
TIBC: 276 ug/dL (ref 250–450)
UIBC: 237 ug/dL

## 2020-05-11 LAB — FERRITIN: Ferritin: 254 ng/mL (ref 24–336)

## 2020-05-11 LAB — RETICULOCYTES
Immature Retic Fract: 24.4 % — ABNORMAL HIGH (ref 2.3–15.9)
RBC.: 2.98 MIL/uL — ABNORMAL LOW (ref 4.22–5.81)
Retic Count, Absolute: 59.9 10*3/uL (ref 19.0–186.0)
Retic Ct Pct: 2 % (ref 0.4–3.1)

## 2020-05-11 LAB — MAGNESIUM: Magnesium: 2.1 mg/dL (ref 1.7–2.4)

## 2020-05-11 LAB — FOLATE: Folate: 8 ng/mL (ref >5.9)

## 2020-05-11 LAB — PATHOLOGIST SMEAR REVIEW: Path Review: 10112021

## 2020-05-11 MED ORDER — IOHEXOL 300 MG/ML  SOLN
100.0000 mL | Freq: Once | INTRAMUSCULAR | Status: AC | PRN
  Administered 2020-05-11: 100 mL via INTRAVENOUS

## 2020-05-11 MED ORDER — DEXTROSE 5 % IV SOLN: INTRAVENOUS | Status: DC

## 2020-05-11 MED ORDER — DEXTROSE 5 % IV SOLN: INTRAVENOUS | Status: AC

## 2020-05-11 MED ORDER — PANTOPRAZOLE SODIUM 40 MG PO PACK
40.0000 mg | PACK | Freq: Two times a day (BID) | ORAL | Status: DC
  Administered 2020-05-11 – 2020-06-19 (×78): 40 mg
  Filled 2020-05-11 (×80): qty 20

## 2020-05-11 MED ORDER — INSULIN ASPART 100 UNIT/ML ~~LOC~~ SOLN
0.0000 [IU] | SUBCUTANEOUS | Status: DC
  Administered 2020-05-11: 2 [IU] via SUBCUTANEOUS
  Administered 2020-05-11: 3 [IU] via SUBCUTANEOUS
  Administered 2020-05-11 (×2): 2 [IU] via SUBCUTANEOUS
  Administered 2020-05-11: 3 [IU] via SUBCUTANEOUS
  Administered 2020-05-12: 2 [IU] via SUBCUTANEOUS
  Administered 2020-05-12: 1 [IU] via SUBCUTANEOUS
  Administered 2020-05-12 (×2): 2 [IU] via SUBCUTANEOUS
  Administered 2020-05-12: 3 [IU] via SUBCUTANEOUS
  Administered 2020-05-13: 2 [IU] via SUBCUTANEOUS
  Administered 2020-05-13 (×2): 1 [IU] via SUBCUTANEOUS
  Administered 2020-05-13: 2 [IU] via SUBCUTANEOUS
  Administered 2020-05-13 (×2): 1 [IU] via SUBCUTANEOUS
  Administered 2020-05-14 (×5): 2 [IU] via SUBCUTANEOUS
  Administered 2020-05-14 (×2): 1 [IU] via SUBCUTANEOUS
  Administered 2020-05-15 (×3): 2 [IU] via SUBCUTANEOUS
  Administered 2020-05-16 – 2020-05-22 (×32): 1 [IU] via SUBCUTANEOUS
  Administered 2020-05-23: 3 [IU] via SUBCUTANEOUS
  Administered 2020-05-23 – 2020-06-01 (×32): 1 [IU] via SUBCUTANEOUS
  Administered 2020-06-01: 2 [IU] via SUBCUTANEOUS
  Administered 2020-06-01 – 2020-06-02 (×6): 1 [IU] via SUBCUTANEOUS
  Administered 2020-06-02: 2 [IU] via SUBCUTANEOUS
  Administered 2020-06-03 – 2020-06-05 (×15): 1 [IU] via SUBCUTANEOUS
  Administered 2020-06-05: 2 [IU] via SUBCUTANEOUS
  Administered 2020-06-06 – 2020-06-17 (×38): 1 [IU] via SUBCUTANEOUS
  Administered 2020-06-18: 2 [IU] via SUBCUTANEOUS
  Administered 2020-06-18 – 2020-06-20 (×8): 1 [IU] via SUBCUTANEOUS
  Administered 2020-06-21: 2 [IU] via SUBCUTANEOUS
  Administered 2020-06-21: 3 [IU] via SUBCUTANEOUS
  Administered 2020-06-22 (×3): 2 [IU] via SUBCUTANEOUS
  Administered 2020-06-22: 1 [IU] via SUBCUTANEOUS
  Administered 2020-06-23: 2 [IU] via SUBCUTANEOUS
  Administered 2020-06-23 – 2020-06-25 (×11): 1 [IU] via SUBCUTANEOUS
  Administered 2020-06-26: 2 [IU] via SUBCUTANEOUS
  Administered 2020-06-26 – 2020-06-27 (×5): 1 [IU] via SUBCUTANEOUS
  Administered 2020-06-27: 2 [IU] via SUBCUTANEOUS
  Administered 2020-06-28 (×3): 1 [IU] via SUBCUTANEOUS
  Administered 2020-06-28: 2 [IU] via SUBCUTANEOUS
  Administered 2020-06-29 (×2): 1 [IU] via SUBCUTANEOUS
  Administered 2020-06-29 (×2): 2 [IU] via SUBCUTANEOUS
  Administered 2020-06-30 – 2020-07-05 (×15): 1 [IU] via SUBCUTANEOUS
  Administered 2020-07-05 (×2): 2 [IU] via SUBCUTANEOUS
  Administered 2020-07-06 – 2020-07-10 (×21): 1 [IU] via SUBCUTANEOUS
  Administered 2020-07-10: 09:00:00 2 [IU] via SUBCUTANEOUS
  Administered 2020-07-11: 13:00:00 1 [IU] via SUBCUTANEOUS
  Administered 2020-07-11: 04:00:00 2 [IU] via SUBCUTANEOUS
  Administered 2020-07-11 – 2020-07-13 (×4): 1 [IU] via SUBCUTANEOUS
  Administered 2020-07-13: 05:00:00 2 [IU] via SUBCUTANEOUS
  Administered 2020-07-14 – 2020-07-16 (×8): 1 [IU] via SUBCUTANEOUS

## 2020-05-11 MED ORDER — SODIUM CHLORIDE 0.9 % IV SOLN: INTRAVENOUS | Status: DC

## 2020-05-11 MED ORDER — IOHEXOL 9 MG/ML PO SOLN: 500.0000 mL | ORAL | Status: AC

## 2020-05-11 NOTE — Progress Notes (Signed)
Inpatient Rehab Admissions Coordinator:   Pt. Remains agitated, requiring restraints. Pt. Is not medically ready for transfer to CIR. If mentation does not improve, SNF may be a more appropriate disposition.   Megan Salon, MS, CCC-SLP Rehab Admissions Coordinator  [phone] (celll) [phone] (office)

## 2020-05-11 NOTE — Progress Notes (Signed)
PROGRESS NOTE    Alan Everett  ZOX:[id number] DOB: 01-15-1956 DOA: 05/05/2020 PCP: Patient, No Pcp Per   Brief Narrative:  HPI per [name] on 05/05/20 Alan Everett is an 64 y.o. male with a PMHx of alcohol use and tobacco abuse who presents acutely to the ED via EMS as a Code Stroke for acute onset of right sided weakness. He denies headache but acknowledges that he has sensory loss and weakness involving his RUE and RLE. He states that he is weak on the right, His speech pattern is most consistent with a mixed receptive and expressive dysphasia. 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   CT head: 1. 2.3 x 2.1 x 2.9 cm acute intraparenchymal hemorrhage centered at the left thalamic capsular region, estimated volume 7 cc. Minimal surrounding vasogenic edema without significant regional mass effect or midline shift. Associated intraventricular extension with small volume intraventricular blood within the adjacent left lateral ventricle. No hydrocephalus or ventricular trapping. 2. No other acute intracranial abnormality. 3. Underlying age-related cerebral atrophy with mild chronic small vessel ischemic disease.  ICH score: 1  **Interim History Patient's blood pressure was significantly elevated.  CT showed a left forearm mass R IPH with IVH.  Neurosurgery was consulted for EVD consideration which was then deferred.  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  He currently underwent stroke work-up and has been transitioned to [facility] service given that he is now out of the ICU.  Yesterday he spiked a worsening temperature and his WBC is worsening so we started the patient on IV Unasyn.  PT OT recommending CIR.  Patient is not very responsive and continues to have global aphasia and does not really follow commands.  05/10/2020: He was little bit more awake today but remains aphasic and does not follow commands.  Fever curve is trending down some but  still remains febrile WBC remains significantly elevated but minimally improved.  We will continue IV antibiotics and likely he has been aspirating so we will continue after treatment for at least 7 days total.  Attempts were made to give him his nebulizer treatment but he was agitated and started pulling off his mask and dumped out the nebulized medication.  SLP recommending continuing n.p.o. and PT OT recommending CIR  05/11/2020: Mains encephalopathic and fever started worsening along with his WBC.  Will obtain a CT chest/abdomen/pelvis to further evaluate.  Discussed the case with infectious diseases and they recommend continuing IV Zosyn for now and holding escalation of antibiotics until CT scans have been obtained.  If he continues to have fevers and worsening white count ID recommended repeating a head scan given his recent bleed.  His oral iron was stopped yesterday and GI was consulted given concern of positive FOBT and drop in hemoglobin.  He is at high risk for getting an EGD in the setting of his acute CVA and lack of overt bleeding.  They are recommending cross-sectional imaging to exclude large GI lesions within the small bowel.  They recommend if patient has progressive anemia, transfusion dependent or requirements they will consider an EGD with VCE placement given that he is unable to swallow.  [name] believes that he could have small bowel pathology and could be having some angiectasia's that have been bleeding given that he had recently had an EGD and colon a few months ago.  Assessment & Plan:   Active Problems:   ICH (intracerebral hemorrhage) (HCC)   SIRS (systemic inflammatory  response syndrome) (HCC)   Polysubstance abuse (HCC)   Dysphagia, post-stroke   Hyperglycemia   Hypokalemia   Hypomagnesemia  Acute Hemorrhagic Stroke - L BG ICH w/ IVH d/t severe hypertension in setting of alcohol and cocaine use -Code Stroke CT head 10/6 1844 L thalamocapsular IPH 7cc w/ minimal  edema, mild IVH. Small vessel disease. Atrophy.  -CT head 10/6 2042 enlarging L thalamocapsular IPH w/ increasing IVH from L ventricle into 3rd and 4th ventricles. -CT head 10/7 0510 stable L thalamic IPH w/ IVH w/ clearing of blood in 4th ventricle.  -CTA head and neck 10/8 unremarkable angio, slight increase in midline shift and ventriculomegaly -MRI not able to perform due to no family contact to clear for metal -Repeat CT head 10/9 suboptimal but stable -2D Echo EF 60-65% -LDL 80 -HgbA1c 4.6 -UDS positive for cocaine -VTE prophylaxis - SCDs  -No antithrombotic prior to admission, now on No antithrombotic given IPH -Therapy recommendations:  CIR vs SNF and will go to CIR once more medically stable -Management per Neurology and they feel that his exam is essentially unchanged and recommending CIR when medically stable for discharge  Cerebral Edema -CT head 10/6 2042 enlarging L thalamocapsular IPH w/ increasing IVH from L ventricle into 3rd and 4th ventricles. -Neurosurgery Dr. Yetta Barre consulted no indication for EVD. Following. -CT head 10/7 0510 stable L thalamic IPH w/ IVH w/ clearing of blood in 4th ventricle.   -CTA head and neck 10/8 unremarkable angio, slight increase in midline shift and ventriculomegaly -CT head repeat 10/9 stable -Further Care per Neurology and appreciate further recommendations -May consider obtaining a repeat head CT if his WBC continues to worsen and CT of the chest abdomen pelvis is unremarkable  Hypertensive Emergency -Per EMS, SBP > 200  -Home meds:  none -Weaned off ofPut on Cleviprex for BP control -> now off -Hydralazine IV PRN -Now On Amlodipine 10 and lisinopril 20 bid -SBP goal < 160 -Long-term BP goal normotensive; blood pressure was 100/58 -Continue to monitor blood pressures per protocol  Hyperlipidemia -Home meds:  None -LDL 80, goal < 70 -No statin in setting of IPH -Mild transaminitis, likely related to alcohol, AST/ALT 68/64  ->52/47 -> 35/45 -> and is Now worsening and is 90/78 respectively -Will Consider statin low dose on discharge with normalized liver enzymes and defer to Neurology to initiate   Dysphagia -Secondary to stroke -C/w NPO; Will need SLP to Follow -On IVF with NS at 30 mL/hr which will now be discontinued -Cortrak place -On TF @ 65 -Continue to Monitor Progress and SLP recommends n.p.o. still   Tobacco Abuse -Patient is a current smoker and will need smoking cessation counseling given once he is more awake  Cocaine abuse -UDS positive for cocaine -Cocaine cessation will be provided when he is more awake -Avoid beta-blocker  Hypophosphatemia -Patient's Phos Level is now 3.0 -Continue to Monitor and Replete as Necessary -Repeat Phos Level in the AM  Hypokalemia -Patient's K+ this AM was 4.1 -Replete with po KCl 40 mEQ BID x2 per PEG yesterday -Continue to Monitor and Replete as Necessary -Repeat CMP in the AM   Normocytic Anemia -Patient's Hgb/Hct trending down from 12.0/38.3 -> 11.9/37.9 -> 10.7/34.1 and is trending down to 9.5/30.6 -> 8.7/28.4 -He is having black stools so will stop Iron Supplementation; Check FOBT and was positive and his hemoglobin continues to drop he will IV fluids -In the setting of Dilution initially but now concerned for ?GIB; will resume IV fluids with  D5W given his hypernatremia/hyperchloremia -Was on Ferrous Sulfate 300 mg per Tube Daily But will stop -Checked anemia panel and showed an iron level of 39, U IBC of 237, TIBC of 276, saturation ratios of 14%, ferritin level 254, folate level 8.0, vitamin B12 level of 377 -Continue to Monitor for S/Sx of Bleeding; Currently having black stools -Will discuss with Gastroenterology and they were consulted and he is recently had an EGD and colon over the last 2 months.  Dr. Clabe Seal recommends next up in evaluating would be a video capsule endoscopy but I cannot be formed due to his recent CVA and dysphagia and  since his inability swallowed it would have to be done endoscopically and placed an EGD.  Currently endoscopic procedures are high risk with his recent acute CVA and anything small bowel pathology would require a deep enteroscopy or may not be reachable via endoscopically and likely the pathology would be intestinal angiectasia's.  [name] recommends continue to monitor carefully and agrees with the cross-sectional imaging that I have ordered today.  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 -Repeat CBC in the AM   SIRS with Leukocytosis and Fever in the setting of Aspiration Pneumonia, worsening -Likely in the setting of aspiration pneumonia but will need to rule out other etiologies -We will panculture and obtain blood cultures x2, urinalysis and urine culture, and repeat chest x-ray -urinalysis is relatively unremarkable and showed a hazy appearance with small hemoglobin 11 dipstick, present amorphous cells, rare bacteria, 0-5 RBCs per high-power field, and 6-10 WBCs -Urine and culture blood cultures x2 pending; urine culture is now growing 100,000 colonies of Klebsiella pneumoniae sensitive to Ampicillin/Sulbactam -Chest x-ray yesterday showed "Persistent right lower lung heterogeneous opacities which may represent atelectasis or infection. New opacities left mid lung may represent atelectasis or infection." -CXR today showed "Mild bibasilar opacities are noted concerning for multifocal pneumonia." -Elevate head of the bed greater than 30 degrees  -Add breathing Treatments with Scheduled Xopenex/Atrovent -WBC has been worsening in the last few days and has gone from 16.2 -> 24.1 -> 26.7 -> 26.5 further worsened 29.3 -We will obtain a CT of the chest/abdomen/pelvis to further evaluate and this is pending to be done -Empirically started IV Unasyn given his concern for  aspiration from his stroke will continue for now but may need to escalate -Discussed the case with ID who recommends evaluating the CT of the chest and abdomen prior to escalating antibiotics and if he continues to spike temperatures and has worsening blood count will likely repeat his head CT and consult ID for further evaluation recommendations from -Continue with acetaminophen for fever -Continue monitor temperature curve and WBCs and repeat both in a.m if continues to worsen may consider escalating antibiotics IV Vanco and IV cefepime.  GERD -C/w Pantorpazole 40 mg per Tube Daily qHS and will increase to twice daily   Hypernatremia/hyperchloremia -Sodium is slightly elevated at 146 and chloride level is 116 -We will start D5W at 75 MLS per hour for 1 day -Continue monitor and trend and repeat in a.m.  Thrombocytopenia -Mild as patient's platelet count went from 150,000 and is now 143,000 yesterday and today is 177,000 -Continueto monitor for signs and symptoms bleeding; currently no overt bleeding  -Repeat CBC in the AM.  Hyperglycemia  -Likely reactive on tube feedings and his hemoglobin A1c is 4.6 -We will  continue monitor CBGs carefully and ranging from 121-172 on BMP/CMP; CBG's ranging from 100-213 -Continue to monitor given that we will start D5W +5 MLS per hour -If necessary will place on sensitive NovoLog sliding scale insulin every 4h  DVT prophylaxis: SCDs Code Status: FULL CODE Family Communication: No family present at bedside Disposition Plan: Need further clinical improvement back to baseline and anticipate discharge to CIR versus SNF once medically stable  Status is: Inpatient  Remains inpatient appropriate because:Altered mental status, Unsafe d/c plan, IV treatments appropriate due to intensity of illness or inability to take PO and Inpatient level of care appropriate due to severity of illness   Dispo: The patient is from: Home              Anticipated d/c is  to: CIR vs. SNF              Anticipated d/c date is: 3 days              Patient currently is not medically stable to d/c.  Consultants:   Neurology  Gastroenterology  Case was discussed with infectious diseases Dr. Daiva Eves  Procedures:  ECHOCARDIOGRAM IMPRESSIONS    1. Left ventricular ejection fraction, by estimation, is 60 to 65%. The  left ventricle has normal function. The left ventricle has no regional  wall motion abnormalities. Left ventricular diastolic parameters are  consistent with Grade I diastolic  dysfunction (impaired relaxation).  2. Right ventricular systolic function is normal. The right ventricular  size is normal. Tricuspid regurgitation signal is inadequate for assessing  PA pressure.  3. The mitral valve is normal in structure. No evidence of mitral valve  regurgitation. No evidence of mitral stenosis.  4. The aortic valve is tricuspid. Aortic valve regurgitation is not  visualized. No aortic stenosis is present.  5. The inferior vena cava is normal in size with greater than 50%  respiratory variability, suggesting right atrial pressure of 3 mmHg.   FINDINGS  Left Ventricle: Left ventricular ejection fraction, by estimation, is 60  to 65%. The left ventricle has normal function. The left ventricle has no  regional wall motion abnormalities. The left ventricular internal cavity  size was normal in size. There is  no left ventricular hypertrophy. Left ventricular diastolic parameters  are consistent with Grade I diastolic dysfunction (impaired relaxation).   Right Ventricle: The right ventricular size is normal. No increase in  right ventricular wall thickness. Right ventricular systolic function is  normal. Tricuspid regurgitation signal is inadequate for assessing PA  pressure.   Left Atrium: Left atrial size was normal in size.   Right Atrium: Right atrial size was normal in size.   Pericardium: There is no evidence of pericardial  effusion.   Mitral Valve: The mitral valve is normal in structure. No evidence of  mitral valve regurgitation. No evidence of mitral valve stenosis.   Tricuspid Valve: The tricuspid valve is normal in structure. Tricuspid  valve regurgitation is not demonstrated.   Aortic Valve: The aortic valve is tricuspid. Aortic valve regurgitation is  not visualized. No aortic stenosis is present.   Pulmonic Valve: The pulmonic valve was normal in structure. Pulmonic valve  regurgitation is not visualized.   Aorta: The aortic root is normal in size and structure.   Venous: The inferior vena cava is normal in size with greater than 50%  respiratory variability, suggesting right atrial pressure of 3 mmHg.   IAS/Shunts: No atrial level shunt detected by color flow Doppler.  LEFT VENTRICLE  PLAX 2D  LVIDd:     4.30 cm Diastology  LVIDs:     2.70 cm LV e' medial:  8.49 cm/s  LV PW:     1.00 cm LV E/e' medial: 9.2  LV IVS:    0.90 cm LV e' lateral:  7.46 cm/s  LVOT diam:   2.20 cm LV E/e' lateral: 10.4  LV SV:     98  LV SV Index:  57  LVOT Area:   3.80 cm     RIGHT VENTRICLE  RV S prime:   13.90 cm/s  TAPSE (M-mode): 2.0 cm   LEFT ATRIUM       Index    RIGHT ATRIUM      Index  LA diam:    3.10 cm 1.78 cm/m RA Area:   11.60 cm  LA Vol (A2C):  40.9 ml 23.49 ml/m RA Volume:  24.60 ml 14.13 ml/m  LA Vol (A4C):  37.9 ml 21.77 ml/m  LA Biplane Vol: 43.0 ml 24.69 ml/m  AORTIC VALVE  LVOT Vmax:  120.00 cm/s  LVOT Vmean: 75.400 cm/s  LVOT VTI:  0.259 m    AORTA  Ao Root diam: 3.20 cm   MITRAL VALVE  MV Area (PHT): 2.80 cm  SHUNTS  MV Decel Time: 271 msec  Systemic VTI: 0.26 m  MV E velocity: 77.70 cm/s Systemic Diam: 2.20 cm  MV A velocity: 84.50 cm/s  MV E/A ratio: 0.92   Antimicrobials:  Anti-infectives (From admission, onward)   Start     Dose/Rate Route Frequency Ordered Stop   05/09/20 0930   Ampicillin-Sulbactam (UNASYN) 3 g in sodium chloride 0.9 % 100 mL IVPB  Status:  Discontinued        3 g 200 mL/hr over 30 Minutes Intravenous Every 8 hours 05/09/20 0837 05/09/20 0839   05/09/20 0930  Ampicillin-Sulbactam (UNASYN) 3 g in sodium chloride 0.9 % 100 mL IVPB        3 g 200 mL/hr over 30 Minutes Intravenous Every 6 hours 05/09/20 0839          Subjective: Seen and examined remains he still remains encephalopathic and now having fevers and worsening tachycardia.  He continues to spike temperatures and his hemoglobin continues to drop.  Did not have any more bloody bowel movements or dark bowel movements overnight.  No other concerns or complaints at this time.  Objective: Vitals:   05/11/20 1201 05/11/20 1300 05/11/20 1400 05/11/20 1648  BP: (!) 115/55 (!) 98/55 104/75 (!) 100/58  Pulse: (!) 123 (!) 118 (!) 110 (!) 102  Resp: (!) 21 16 16 20   Temp: (!) 103.2 F (39.6 C) 98.7 F (37.1 C) 99.4 F (37.4 C) 98.1 F (36.7 C)  TempSrc: Oral Oral  Axillary  SpO2: 97% 99% 99% 98%  Weight:        Intake/Output Summary (Last 24 hours) at 05/11/2020 1718 Last data filed at 05/11/2020 1032 Gross per 24 hour  Intake 550 ml  Output 600 ml  Net -50 ml   Filed Weights   05/09/20 0330 05/10/20 0500 05/11/20 0331  Weight: 65.3 kg 61.2 kg 61.4 kg   Examination: Physical Exam:  Constitutional: Patient is a thin chronically ill-appearing African-American male who remains encephalopathic and has had a fever today.  He remains aphasic and leaning towards the left today. Eyes: Continues to have a left gaze preference ENMT: External Ears, Nose appear normal. Grossly normal hearing. Neck: Appears normal, supple, no cervical masses, normal  ROM, no appreciable thyromegaly; no JVD Respiratory: Diminished to auscultation bilaterally with coarse breath sounds and some rhonchi bilaterally.  No appreciable wheezing, rales, or crackles.  Normal respiratory effort and unlabored  breathing Cardiovascular: Tachycardic rate but regular rhythm, no murmurs / rubs / gallops. S1 and S2 auscultated.  Very slight extremity edema..  Abdomen: Soft, non-tender, non-distended.  2 nodules/lesions are palpable on the right side.  Bowel sounds positive.  GU: Deferred. Musculoskeletal: No clubbing / cyanosis of digits/nails. No joint deformity upper and lower extremities.  Skin: No rashes, lesions, ulcers on limited skin evaluation. No induration; Warm and dry.  Neurologic: Does not follow commands and remains aphasic Psychiatric: Continues to have impaired judgment and insight.  He is not alert and oriented x 3. Normal mood and appropriate affect.   Data Reviewed: I have personally reviewed following labs and imaging studies  CBC: Recent Labs  Lab 05/05/20 1835 05/05/20 1928 05/05/20 2003 05/05/20 2003 05/06/20 0738 05/06/20 0738 05/07/20 0337 05/08/20 1736 05/09/20 0500 05/10/20 0200 05/11/20 0637  WBC 3.0*   < > 8.8   < > 15.9*   < > 16.2* 24.1* 26.7* 26.5* 29.3*  NEUTROABS 1.6*  --  6.6  --  13.7*  --   --   --   --  20.0* 21.9*  HGB 12.4*   < > 12.3*   < > 11.6*   < > 12.0* 11.9* 10.7* 9.5* 8.7*  HCT 40.8   < > 40.8   < > 36.2*   < > 38.3* 37.9* 34.1* 30.6* 28.4*  MCV 92.9   < > 92.7   < > 91.6   < > 92.1 92.0 90.9 92.4 93.7  PLT 6*   < > 149*   < > 154   < > 136* 150 144* 143* 177   < > = values in this interval not displayed.   Basic Metabolic Panel: Recent Labs  Lab 05/07/20 0337 05/07/20 1012 05/08/20 1032 05/08/20 1736 05/09/20 0500 05/09/20 0827 05/10/20 0200 05/11/20 0637  NA 136  --  138  --  141  --  142 146*  K 3.4*  --  3.1*  --  3.3*  --  3.4* 4.1  CL 104  --  106  --  109  --  110 116*  CO2 21*  --  21*  --  21*  --  23 19*  GLUCOSE 166*  --  139*  --  150*  --  146* 157*  BUN 9  --  16  --  24*  --  24* 21  CREATININE 0.81  --  0.79  --  0.96  --  0.90 0.91  CALCIUM 9.5  --  9.3  --  8.7*  --  8.5* 8.9  MG  --    < > 1.9 1.9  --  2.2 2.3  2.1  PHOS  --    < > 2.0* 1.5*  --  2.0* 2.6 3.0   < > = values in this interval not displayed.   GFR: Estimated Creatinine Clearance: 71.2 mL/min (by C-G formula based on SCr of 0.91 mg/dL). Liver Function Tests: Recent Labs  Lab 05/05/20 1835 05/06/20 0738 05/09/20 0827 05/10/20 0200 05/11/20 0637  AST 68* 52* 35 39 90*  ALT 64* 47* 45* 40 78*  ALKPHOS 90 73 70 63 71  BILITOT 0.8 0.6 0.5 0.2* 0.7  PROT 8.6* 7.6 7.5 7.1 7.8  ALBUMIN 3.9 3.5 2.8* 2.5* 2.6*  No results for input(s): LIPASE, AMYLASE in the last 168 hours. No results for input(s): AMMONIA in the last 168 hours. Coagulation Profile: Recent Labs  Lab 05/05/20 1930  INR 1.2   Cardiac Enzymes: No results for input(s): CKTOTAL, CKMB, CKMBINDEX, TROPONINI in the last 168 hours. BNP (last 3 results) No results for input(s): PROBNP in the last 8760 hours. HbA1C: No results for input(s): HGBA1C in the last 72 hours. CBG: Recent Labs  Lab 05/11/20 0321 05/11/20 0544 05/11/20 0843 05/11/20 1204 05/11/20 1651  GLUCAP 169* 157* 213* 187* 100*   Lipid Profile: No results for input(s): CHOL, HDL, LDLCALC, TRIG, CHOLHDL, LDLDIRECT in the last 72 hours. Thyroid Function Tests: No results for input(s): TSH, T4TOTAL, FREET4, T3FREE, THYROIDAB in the last 72 hours. Anemia Panel: Recent Labs    05/11/20 0637  VITAMINB12 377  FOLATE 8.0  FERRITIN 254  TIBC 276  IRON 39*  RETICCTPCT 2.0   Sepsis Labs: No results for input(s): PROCALCITON, LATICACIDVEN in the last 168 hours.  Recent Results (from the past 240 hour(s))  Respiratory Panel by RT PCR (Flu A&B, Covid) - Nasopharyngeal Swab     Status: None   Collection Time: 05/05/20  6:46 PM   Specimen: Nasopharyngeal Swab  Result Value Ref Range Status   SARS Coronavirus 2 by RT PCR NEGATIVE NEGATIVE Final    Comment: (NOTE) SARS-CoV-2 target nucleic acids are NOT DETECTED.  The SARS-CoV-2 RNA is generally detectable in upper respiratoy specimens during  the acute phase of infection. The lowest concentration of SARS-CoV-2 viral copies this assay can detect is 131 copies/mL. A negative result does not preclude SARS-Cov-2 infection and should not be used as the sole basis for treatment or other patient management decisions. A negative result may occur with  improper specimen collection/handling, submission of specimen other than nasopharyngeal swab, presence of viral mutation(s) within the areas targeted by this assay, and inadequate number of viral copies (<131 copies/mL). A negative result must be combined with clinical observations, patient history, and epidemiological information. The expected result is Negative.  Fact Sheet for Patients:  https://www.moore.com/  Fact Sheet for Healthcare Providers:  https://www.young.biz/  This test is no t yet approved or cleared by the [place] FDA and  has been authorized for detection and/or diagnosis of SARS-CoV-2 by FDA under an Emergency Use Authorization (EUA). This EUA will remain  in effect (meaning this test can be used) for the duration of the COVID-19 declaration under Section 564(b)(1) of the Act, 21 U.S.C. section 360bbb-3(b)(1), unless the authorization is terminated or revoked sooner.     Influenza A by PCR NEGATIVE NEGATIVE Final   Influenza B by PCR NEGATIVE NEGATIVE Final    Comment: (NOTE) The Xpert Xpress SARS-CoV-2/FLU/RSV assay is intended as an aid in  the diagnosis of influenza from Nasopharyngeal swab specimens and  should not be used as a sole basis for treatment. Nasal washings and  aspirates are unacceptable for Xpert Xpress SARS-CoV-2/FLU/RSV  testing.  Fact Sheet for Patients: https://www.moore.com/  Fact Sheet for Healthcare Providers: https://www.young.biz/  This test is not yet approved or cleared by the [place] FDA and  has been authorized for detection and/or  diagnosis of SARS-CoV-2 by  FDA under an Emergency Use Authorization (EUA). This EUA will remain  in effect (meaning this test can be used) for the duration of the  Covid-19 declaration under Section 564(b)(1) of the Act, 21  U.S.C. section 360bbb-3(b)(1), unless the authorization is  terminated or revoked. Performed at  [facility], [address], [place], [place] [address]   MRSA PCR Screening     Status: None   Collection Time: 05/05/20 11:56 PM   Specimen: Nasal Mucosa; Nasopharyngeal  Result Value Ref Range Status   MRSA by PCR NEGATIVE NEGATIVE Final    Comment:        The GeneXpert MRSA Assay (FDA approved for NASAL specimens only), is one component of a comprehensive MRSA colonization surveillance program. It is not intended to diagnose MRSA infection nor to guide or monitor treatment for MRSA infections. Performed at [facility], [address] [place], [place] [address]   Culture, blood (Routine X 2) w Reflex to ID Panel     Status: None (Preliminary result)   Collection Time: 05/07/20  7:59 PM   Specimen: BLOOD LEFT HAND  Result Value Ref Range Status   Specimen Description BLOOD LEFT HAND  Final   Special Requests   Final    BOTTLES DRAWN AEROBIC AND ANAEROBIC Blood Culture adequate volume   Culture   Final    NO GROWTH 4 DAYS Performed at [facility], [address] [place], [place] [address]    Report Status PENDING  Incomplete  Culture, blood (Routine X 2) w Reflex to ID Panel     Status: None (Preliminary result)   Collection Time: 05/07/20  8:06 PM   Specimen: BLOOD RIGHT HAND  Result Value Ref Range Status   Specimen Description BLOOD RIGHT HAND  Final   Special Requests   Final    BOTTLES DRAWN AEROBIC AND ANAEROBIC Blood Culture adequate volume   Culture   Final    NO GROWTH 4 DAYS Performed at [facility], [address] [place], [place] [address]    Report Status PENDING  Incomplete  Culture, blood (routine x 2)      Status: None (Preliminary result)   Collection Time: 05/09/20  8:27 AM   Specimen: BLOOD  Result Value Ref Range Status   Specimen Description BLOOD RIGHT ANTECUBITAL  Final   Special Requests   Final    AEROBIC BOTTLE ONLY Blood Culture results may not be optimal due to an inadequate volume of blood received in culture bottles   Culture   Final    NO GROWTH 2 DAYS Performed at [facility], [address] [place], [place] [address]    Report Status PENDING  Incomplete  Culture, blood (routine x 2)     Status: None (Preliminary result)   Collection Time: 05/09/20  8:27 AM   Specimen: BLOOD  Result Value Ref Range Status   Specimen Description BLOOD RIGHT ANTECUBITAL  Final   Special Requests   Final    AEROBIC BOTTLE ONLY Blood Culture results may not be optimal due to an inadequate volume of blood received in culture bottles   Culture   Final    NO GROWTH 2 DAYS Performed at [facility], [address], [place], [place] [address]    Report Status PENDING  Incomplete  Culture, Urine     Status: Abnormal   Collection Time: 05/09/20  8:40 AM   Specimen: Urine, Random  Result Value Ref Range Status   Specimen Description URINE, RANDOM  Final   Special Requests   Final    NONE Performed at [facility], [address], [place], [place] [address]    Culture >=100,000 COLONIES/mL KLEBSIELLA PNEUMONIAE (A)  Final   Report  Status 05/11/2020 FINAL  Final   Organism ID, Bacteria KLEBSIELLA PNEUMONIAE (A)  Final      Susceptibility   Klebsiella pneumoniae - MIC*    AMPICILLIN RESISTANT Resistant     CEFAZOLIN <=4 SENSITIVE Sensitive     CEFTRIAXONE <=0.25 SENSITIVE Sensitive     CIPROFLOXACIN <=0.25 SENSITIVE Sensitive     GENTAMICIN <=1 SENSITIVE Sensitive     IMIPENEM <=0.25 SENSITIVE Sensitive     NITROFURANTOIN 32 SENSITIVE Sensitive     TRIMETH/SULFA <=20 SENSITIVE Sensitive     AMPICILLIN/SULBACTAM 4 SENSITIVE Sensitive     PIP/TAZO <=4 SENSITIVE  Sensitive     * >=100,000 COLONIES/mL KLEBSIELLA PNEUMONIAE     RN Pressure Injury Documentation:     Estimated body mass index is 21.85 kg/m as calculated from the following:   Height as of 04/09/20: 5\' 6"  (1.676 m).   Weight as of this encounter: 61.4 kg.  Malnutrition Type:  Nutrition Problem: Inadequate oral intake Etiology: dysphagia, acute illness   Malnutrition Characteristics:  Signs/Symptoms: NPO status   Nutrition Interventions:  Interventions: Tube feeding     Radiology Studies: DG CHEST PORT 1 VIEW  Result Date: 05/11/2020 CLINICAL DATA:  Shortness of breath EXAM: PORTABLE CHEST 1 VIEW COMPARISON:  05/10/2020 FINDINGS: Enteric tube passes into the distal stomach with tip out of field of view. Patchy increased density, left greater than right. No pleural effusion. No pneumothorax. Stable cardiomediastinal contours. IMPRESSION: Persistent patchy increased density, left greater than right, which may reflect pneumonia. Electronically Signed   By: 07/10/2020 M.D.   On: 05/11/2020 08:11   DG CHEST PORT 1 VIEW  Result Date: 05/10/2020 CLINICAL DATA:  Shortness of breath. EXAM: PORTABLE CHEST 1 VIEW COMPARISON:  May 09, 2020. FINDINGS: Stable cardiomediastinal silhouette. Feeding tube is seen entering stomach. No pneumothorax or pleural effusion is noted. Mild bibasilar opacities are noted concerning for multifocal pneumonia. Bony thorax is unremarkable. IMPRESSION: Mild bibasilar opacities are noted concerning for multifocal pneumonia. Electronically Signed   By: May 11, 2020 M.D.   On: 05/10/2020 08:14   Scheduled Meds: . amLODipine  10 mg Per Tube Daily  . chlorhexidine  15 mL Mouth Rinse BID  . Chlorhexidine Gluconate Cloth  6 each Topical Daily  . insulin aspart  0-9 Units Subcutaneous Q4H  . ipratropium  0.5 mg Nebulization BID  . levalbuterol  0.63 mg Nebulization BID  . lisinopril  20 mg Per Tube BID  . mouth rinse  15 mL Mouth Rinse q12n4p  .  pantoprazole sodium  40 mg Per Tube Daily  . senna-docusate  1 tablet Per Tube BID  . sodium chloride flush  10-40 mL Intracatheter Q12H   Continuous Infusions: . ampicillin-sulbactam (UNASYN) IV 3 g (05/11/20 1631)  . feeding supplement (OSMOLITE 1.2 CAL) 1,000 mL (05/11/20 0602)    LOS: 6 days   07/11/20, DO [facility] PAGER is on AMION  If 7PM-7AM, please contact night-coverage [email or web address]

## 2020-05-11 NOTE — Progress Notes (Signed)
Progress Note   Subjective  Pt sleepy, will open eyes, no purposeful communication Not following commands TFs ongoing Per RN no report of bloody stools overnight or today Previous oral iron stopped by hospital medicine   Objective  Vital signs in last 24 hours: Temp:  [98.1 F (36.7 C)-101 F (38.3 C)] 98.1 F (36.7 C) (10/12 0729) Pulse Rate:  [98-121] 121 (10/12 0729) Resp:  [18-21] 21 (10/12 0729) BP: (124-160)/(55-81) 153/55 (10/12 0729) SpO2:  [95 %-100 %] 97 % (10/12 0822) Weight:  [61.4 kg] 61.4 kg (10/12 0331) Last BM Date: 05/10/20  Gen: eyes closed, chronically ill appearing, NAD HEENT: anicteric, nasoenteric tube in place CV: tachycardic Pulm: course b/l anteriorly Abd: soft, NT/ND, +BS throughout Ext: no c/c/e Neuro: not following commands, nonverbal   Intake/Output from previous day: 10/11 0701 - 10/12 0700 In: 550 [NG/GT:450; IV Piggyback:100] Out: 600 [Urine:600] Intake/Output this shift: No intake/output data recorded.  Lab Results: Recent Labs    05/09/20 0500 05/10/20 0200 05/11/20 0637  WBC 26.7* 26.5* 29.3*  HGB 10.7* 9.5* 8.7*  HCT 34.1* 30.6* 28.4*  PLT 144* 143* 177   BMET Recent Labs    05/09/20 0500 05/10/20 0200 05/11/20 0637  NA 141 142 146*  K 3.3* 3.4* 4.1  CL 109 110 116*  CO2 21* 23 19*  GLUCOSE 150* 146* 157*  BUN 24* 24* 21  CREATININE 0.96 0.90 0.91  CALCIUM 8.7* 8.5* 8.9   LFT Recent Labs    05/09/20 0827 05/10/20 0200 05/11/20 0637  PROT 7.5   < > 7.8  ALBUMIN 2.8*   < > 2.6*  AST 35   < > 90*  ALT 45*   < > 78*  ALKPHOS 70   < > 71  BILITOT 0.5   < > 0.7  BILIDIR 0.2  --   --   IBILI 0.3  --   --    < > = values in this interval not displayed.   PT/INR No results for input(s): LABPROT, INR in the last 72 hours. Hepatitis Panel No results for input(s): HEPBSAG, HCVAB, HEPAIGM, HEPBIGM in the last 72 hours.  Studies/Results: DG CHEST PORT 1 VIEW  Result Date: 05/11/2020 CLINICAL DATA:   Shortness of breath EXAM: PORTABLE CHEST 1 VIEW COMPARISON:  05/10/2020 FINDINGS: Enteric tube passes into the distal stomach with tip out of field of view. Patchy increased density, left greater than right. No pleural effusion. No pneumothorax. Stable cardiomediastinal contours. IMPRESSION: Persistent patchy increased density, left greater than right, which may reflect pneumonia. Electronically Signed   By: Guadlupe Spanish M.D.   On: 05/11/2020 08:11   DG CHEST PORT 1 VIEW  Result Date: 05/10/2020 CLINICAL DATA:  Shortness of breath. EXAM: PORTABLE CHEST 1 VIEW COMPARISON:  May 09, 2020. FINDINGS: Stable cardiomediastinal silhouette. Feeding tube is seen entering stomach. No pneumothorax or pleural effusion is noted. Mild bibasilar opacities are noted concerning for multifocal pneumonia. Bony thorax is unremarkable. IMPRESSION: Mild bibasilar opacities are noted concerning for multifocal pneumonia. Electronically Signed   By: Lupita Raider M.D.   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 IDA/heme + stools/progressive anemia -- EGD/colon done recently.  Stools dark, but recently on oral iron.  Definitively heme + and progressive anemia.  Next step in eval would be video capsule endoscopy (VCE).  VCE cannot be performed due to recent CVA and dysphagia and thus would have to be endoscopically placed at EGD.  Endoscopic procedures are high risk with recent CVA.  Also, any small bowel pathology would likely require deep enteroscopy or may not be reachable endoscopically.  Most likely pathology would be intestinal angioectasias.    Discussed with hospitalist. Ferritin in normal, % sat a bit low, TIBC is not high. Iron is low.   Given high risk nature of EGD in setting of  acute CVA, lack of overt bleeding, lack of transfusion requirements, and unexplained leukocytosis I would recommend observation for now.  Without oral iron we should be able to assess for melenic stools Agree with cross-sectional imaging as ordered by hospital team (CT chest/abd/pelvis).  This will exclude large GI lesions within the small bowel.  GI team will remain available, and if progressive anemia, transfusion requirement/dependency and once more medically stable, we can consider EGD with VCE placement.  2. Leukocytosis -- asp PNA being treated.  Further eval per hospital team.  CT c/a/p ordered.  Call with questions. Thanks       LOS: 6 days   Beverley Fiedler  05/11/2020, 10:01 AM

## 2020-05-11 NOTE — Progress Notes (Signed)
STROKE TEAM PROGRESS NOTE   INTERVAL HISTORY Patient's fever continues and is 103.2 today.. White count has gone up to 29.3 and hemoglobin is 8.7. He is awake and alert but remains aphasic and barely follows commands.  Neurological exam is unchanged.  Vital signs are stable except for fever and tachycardia. Chest abdomen and pelvis CT scan is planned. ID has been contacted by Dr. Kateri Mc. He has been started on Unasyn. Vitals:   05/11/20 0822 05/11/20 1201 05/11/20 1300 05/11/20 1400  BP:  (!) 115/55 (!) 98/55 104/75  Pulse: 98 (!) 123 (!) 118 (!) 110  Resp: 20 (!) 21 16 16   Temp:  (!) 103.2 F (39.6 C) 98.7 F (37.1 C) 99.4 F (37.4 C)  TempSrc:  Oral Oral   SpO2: 97% 97% 99% 99%  Weight:       CBC:  Recent Labs  Lab 05/10/20 0200 05/11/20 0637  WBC 26.5* 29.3*  NEUTROABS 20.0* 21.9*  HGB 9.5* 8.7*  HCT 30.6* 28.4*  MCV 92.4 93.7  PLT 143* 177   Basic Metabolic Panel:  Recent Labs  Lab 05/10/20 0200 05/11/20 0637  NA 142 146*  K 3.4* 4.1  CL 110 116*  CO2 23 19*  GLUCOSE 146* 157*  BUN 24* 21  CREATININE 0.90 0.91  CALCIUM 8.5* 8.9  MG 2.3 2.1  PHOS 2.6 3.0   Lipid Panel:  Recent Labs  Lab 05/05/20 2003  CHOL 146  TRIG 87  HDL 49  CHOLHDL 3.0  VLDL 17  LDLCALC 80   HgbA1c:  Recent Labs  Lab 05/05/20 2004  HGBA1C 4.6*   Urine Drug Screen:  Recent Labs  Lab 05/06/20 0752  LABOPIA NONE DETECTED  COCAINSCRNUR POSITIVE*  LABBENZ NONE DETECTED  AMPHETMU NONE DETECTED  THCU NONE DETECTED  LABBARB NONE DETECTED    Alcohol Level  Recent Labs  Lab 05/05/20 2003  ETH <10    IMAGING past 24 hours DG CHEST PORT 1 VIEW  Result Date: 05/11/2020 CLINICAL DATA:  Shortness of breath EXAM: PORTABLE CHEST 1 VIEW COMPARISON:  05/10/2020 FINDINGS: Enteric tube passes into the distal stomach with tip out of field of view. Patchy increased density, left greater than right. No pleural effusion. No pneumothorax. Stable cardiomediastinal contours. IMPRESSION:  Persistent patchy increased density, left greater than right, which may reflect pneumonia. Electronically Signed   By: 07/10/2020 M.D.   On: 05/11/2020 08:11    PHYSICAL EXAM     Temp:  [98.1 F (36.7 C)-103.2 F (39.6 C)] 99.4 F (37.4 C) (10/12 1400) Pulse Rate:  [98-123] 110 (10/12 1400) Resp:  [16-21] 16 (10/12 1400) BP: (98-160)/(55-76) 104/75 (10/12 1400) SpO2:  [95 %-100 %] 99 % (10/12 1400) Weight:  [61.4 kg] 61.4 kg (10/12 0331)  General -frail middle-aged African-American male, in no apparent distress.  Ophthalmologic - fundi not visualized due to noncooperation.  Cardiovascular - Regular rhythm and rate.  Neuro - drowsy sleepy but open eyes on voice. Global aphasia, able to make a few sounds but not words. Only followed commands of "eye opening" and "eye closing", no other commands. Not naming or repeating. Left gaze preference, abut able to cross midline. Right HH, not blinking to visual threat. PERRL, right upper and lower facial weakness. Tongue protrusion not cooperative. LUE at least 4/5 and LLE at least 3/5. However, RUE 0/5 mild withdraw to pain. RLE no spontaneous movement but withdraw to pain 3-/5. DTR 1+ and right positive babinski. Sensation, coordination not cooperative and gait not tested.  ASSESSMENT/PLAN Alan Everett is a 64 y.o. male with history of alcohol and tobacco abuse presenting with RUE and RLE weakness, R sensory loss and mixed aphasia. SBP > 200. CT w/ L thalamocapsular IPH w/ IVH. Neuro worsening in ED w/ IVH and hydrocephalus. NS consulted for EVD consideration, which was deferred.   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 Small vessel disease. Atrophy.   CT head 10/6 2042 enlarging L thalamocapsular IPH w/ increasing IVH from L ventricle into 3rd and 4th ventricles.  CT head 10/7 0510 stable L thalamic  IPH w/ IVH w/ clearing of blood in 4th ventricle.   CTA head and neck 10/8 unremarkable angio, slight increase in midline shift and ventriculomegaly  MRI not able to perform due to no family contact to clear for metal  Repeat CT head 10/9 suboptimal but stable  2D Echo EF 60-65%  LDL 80  HgbA1c 4.6  UDS positive for cocaine  VTE prophylaxis - SCDs   No antithrombotic prior to admission, now on No antithrombotic given IPH  Therapy recommendations:  pending - ok OOB  Disposition:  pending   Keep in ICU today, consider xfer tomorrow  Cerebral Edema  CT head 10/6 2042 enlarging L thalamocapsular IPH w/ increasing IVH from L ventricle into 3rd and 4th ventricles.  Neurosurgery consulted Yetta Barre) no indication for EVD. Following.  CT head 10/7 0510 stable L thalamic IPH w/ IVH w/ clearing of blood in 4th ventricle.    CTA head and neck 10/8 unremarkable angio, slight increase in midline shift and ventriculomegaly  CT head repeat 10/9 stable  Hypertensive Emergency  Per EMS, SBP > 200   Home meds:  none  Put on Cleviprex for BP control -> now off  Hydralazine IV PRN  On norvasc 10 and lisinopril 20 bid  SBP goal < 160 . Long-term BP goal normotensive  Leukocytosis and fever  WBC 8.8->15.9->16.2-26.7  Tmax afebrile -> 101.3  UA neg  CXR mild cardiomegaly w/ mild central congestion, suspicion mild interstitial edema   Blood culture pending  Hyperlipidemia  Home meds:  None  LDL 80, goal < 70  No statin in setting of IPH  Mild transaminitis, likely related to alcohol, AST/ALP 68/64->52/47  Consider statin low dose on discharge with normalized liver enzymes  Dysphagia . Secondary to stroke . NPO . On IVF @ 30 . cortrak placed . On TF @ 65 . Speech on board   Tobacco abuse  Current smoker  Smoking cessation counseling will be provided  Cocaine abuse  UDS positive for cocaine  Cocaine cessation will be provided  Avoid  beta-blocker  Other Stroke Risk Factors  ETOH use, alcohol level <10, advised to drink no more than 2 drink(s) a day  Other Active Problems Fever and leukocytes likely from Aspiration Pneumonia Unasyn started 05/09/2020   Hospital day # 6 Continue Unasyn for aspiration pneumonia. Appreciate ID input. Chest abdomen pelvis CT scan planned.. May consider repeat CT head tomorrow if no definite source of infection found on testing today. Continue ongoing therapies.  Transfer to rehabilitation over the next few days when medically stable.  Discussed with Dr. Kateri Mc.  Greater than 50% time during this 25-minute visit was spent on counseling and coordination of care about his intracerebral hemorrhage and discussion with care team.   Delia Heady, MD  To contact Stroke Continuity provider, please refer to [email or web address] After hours, contact General Neurology

## 2020-05-12 ENCOUNTER — Inpatient Hospital Stay (HOSPITAL_COMMUNITY): Payer: Medicaid Other

## 2020-05-12 ENCOUNTER — Encounter (HOSPITAL_COMMUNITY): Payer: Self-pay | Admitting: Neurology

## 2020-05-12 DIAGNOSIS — R509 Fever, unspecified: Secondary | ICD-10-CM

## 2020-05-12 DIAGNOSIS — R5081 Fever presenting with conditions classified elsewhere: Secondary | ICD-10-CM

## 2020-05-12 DIAGNOSIS — R0602 Shortness of breath: Secondary | ICD-10-CM

## 2020-05-12 DIAGNOSIS — I619 Nontraumatic intracerebral hemorrhage, unspecified: Secondary | ICD-10-CM

## 2020-05-12 LAB — GLUCOSE, CAPILLARY
Glucose-Capillary: 142 mg/dL — ABNORMAL HIGH (ref 70–99)
Glucose-Capillary: 148 mg/dL — ABNORMAL HIGH (ref 70–99)
Glucose-Capillary: 169 mg/dL — ABNORMAL HIGH (ref 70–99)
Glucose-Capillary: 192 mg/dL — ABNORMAL HIGH (ref 70–99)
Glucose-Capillary: 193 mg/dL — ABNORMAL HIGH (ref 70–99)
Glucose-Capillary: 203 mg/dL — ABNORMAL HIGH (ref 70–99)

## 2020-05-12 LAB — CBC WITH DIFFERENTIAL/PLATELET
Abs Immature Granulocytes: 1.22 10*3/uL — ABNORMAL HIGH (ref 0.00–0.07)
Basophils Absolute: 0.1 10*3/uL (ref 0.0–0.1)
Basophils Relative: 0 %
Eosinophils Absolute: 0.1 10*3/uL (ref 0.0–0.5)
Eosinophils Relative: 0 %
HCT: 22.1 % — ABNORMAL LOW (ref 39.0–52.0)
Hemoglobin: 6.8 g/dL — CL (ref 13.0–17.0)
Immature Granulocytes: 4 %
Lymphocytes Relative: 5 %
Lymphs Abs: 1.6 10*3/uL (ref 0.7–4.0)
MCH: 28.8 pg (ref 26.0–34.0)
MCHC: 30.8 g/dL (ref 30.0–36.0)
MCV: 93.6 fL (ref 80.0–100.0)
Monocytes Absolute: 3.7 10*3/uL — ABNORMAL HIGH (ref 0.1–1.0)
Monocytes Relative: 12 %
Neutro Abs: 25.3 10*3/uL — ABNORMAL HIGH (ref 1.7–7.7)
Neutrophils Relative %: 79 %
Platelets: 189 10*3/uL (ref 150–400)
RBC: 2.36 MIL/uL — ABNORMAL LOW (ref 4.22–5.81)
RDW: 20.7 % — ABNORMAL HIGH (ref 11.5–15.5)
WBC: 31.9 10*3/uL — ABNORMAL HIGH (ref 4.0–10.5)
nRBC: 0.1 % (ref 0.0–0.2)

## 2020-05-12 LAB — COMPREHENSIVE METABOLIC PANEL
ALT: 86 U/L — ABNORMAL HIGH (ref 0–44)
AST: 118 U/L — ABNORMAL HIGH (ref 15–41)
Albumin: 2.2 g/dL — ABNORMAL LOW (ref 3.5–5.0)
Alkaline Phosphatase: 58 U/L (ref 38–126)
Anion gap: 8 (ref 5–15)
BUN: 25 mg/dL — ABNORMAL HIGH (ref 8–23)
CO2: 22 mmol/L (ref 22–32)
Calcium: 8.5 mg/dL — ABNORMAL LOW (ref 8.9–10.3)
Chloride: 115 mmol/L — ABNORMAL HIGH (ref 98–111)
Creatinine, Ser: 1.08 mg/dL (ref 0.61–1.24)
GFR, Estimated: >60 mL/min (ref >60)
Glucose, Bld: 205 mg/dL — ABNORMAL HIGH (ref 70–99)
Potassium: 3.8 mmol/L (ref 3.5–5.1)
Sodium: 145 mmol/L (ref 135–145)
Total Bilirubin: 0.7 mg/dL (ref 0.3–1.2)
Total Protein: 6.7 g/dL (ref 6.5–8.1)

## 2020-05-12 LAB — CULTURE, BLOOD (ROUTINE X 2)
Culture: NO GROWTH
Culture: NO GROWTH
Special Requests: ADEQUATE
Special Requests: ADEQUATE

## 2020-05-12 LAB — MAGNESIUM: Magnesium: 2.3 mg/dL (ref 1.7–2.4)

## 2020-05-12 LAB — PROCALCITONIN: Procalcitonin: 0.21 ng/mL

## 2020-05-12 LAB — HEMOGLOBIN AND HEMATOCRIT, BLOOD
HCT: 32.5 % — ABNORMAL LOW (ref 39.0–52.0)
Hemoglobin: 10.4 g/dL — ABNORMAL LOW (ref 13.0–17.0)

## 2020-05-12 LAB — PHOSPHORUS: Phosphorus: 3.1 mg/dL (ref 2.5–4.6)

## 2020-05-12 LAB — PREPARE RBC (CROSSMATCH)

## 2020-05-12 MED ORDER — SODIUM CHLORIDE 0.9% IV SOLUTION: Freq: Once | INTRAVENOUS | Status: AC

## 2020-05-12 MED ORDER — SODIUM CHLORIDE 0.9 % IV SOLN
500.0000 mg | Freq: Every day | INTRAVENOUS | Status: DC
  Administered 2020-05-12: 500 mg via INTRAVENOUS
  Filled 2020-05-12 (×2): qty 500

## 2020-05-12 MED ORDER — INSULIN GLARGINE 100 UNIT/ML ~~LOC~~ SOLN
10.0000 [IU] | Freq: Every day | SUBCUTANEOUS | Status: DC
  Administered 2020-05-12 – 2020-05-16 (×5): 10 [IU] via SUBCUTANEOUS
  Filled 2020-05-12 (×5): qty 0.1

## 2020-05-12 MED ORDER — SODIUM CHLORIDE 0.9 % IV SOLN
1.0000 g | Freq: Every day | INTRAVENOUS | Status: DC
  Administered 2020-05-12 – 2020-05-13 (×2): 1 g via INTRAVENOUS
  Filled 2020-05-12 (×2): qty 10

## 2020-05-12 MED ORDER — IOHEXOL 300 MG/ML  SOLN
80.0000 mL | Freq: Once | INTRAMUSCULAR | Status: AC | PRN
  Administered 2020-05-12: 80 mL via INTRAVENOUS

## 2020-05-12 NOTE — Progress Notes (Signed)
Physical Therapy Treatment Patient Details Name: Alan Everett MRN: [id number] DOB: [birth year] Today's Date: 05/12/2020    History of Present Illness Patient is a 64 y/o male with PMH ETOH use, tobacco abuse, admitted with R side weakness,sensory loss and speech deficits.  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    PT Comments    Pt following minimal cues and non-verbal this date. Focused session today on decreasing pushing towards the R through placing pt in sitting with feet off floor and leaning laterally onto L elbow and then progressing to distal lateral L hand placement on bed to more medial hand placement on bed. As a result, his pushing did decrease significantly and he required min-modA to maintain his balance rather than maxA. When he fatigues he does require maxA though. Attempted to cue pt to look toward R, without success. Applied passive stretches to his neck into R lateral flexion and rotation and to B LEs to improve mobility and prevent contractures. Placed pt with rolled up towel under L lateral head at end of session in supine to encourage more midline head orientation. Updated pt d/c recs to SNF due to pt's poor ability to participate at this time. Will continue to follow acutely to maximize his independence and safety with functional mobility.    Follow Up Recommendations  SNF;Supervision/Assistance - 24 hour     Equipment Recommendations  Wheelchair cushion (measurements PT);Wheelchair (measurements PT);Hospital bed;Other (comment) (mechanical lift)    Recommendations for Other Services       Precautions / Restrictions Precautions Precautions: Fall Precaution Comments: soft cuff restraint L wrist, pushes to Rt, coretrack, BP <180    Mobility  Bed Mobility Overal bed mobility: Needs Assistance Bed Mobility: Supine to Sit;Sit to Supine     Supine to sit: Max assist;+2 for physical assistance Sit to  supine: Total assist;+2 for physical assistance   General bed mobility comments:  Attempted to cue pt to move LEs towards EOB, but no command following max A to total A for all aspects of bed mobility  Transfers Overall transfer level: Needs assistance Equipment used: 2 person hand held assist Transfers: Squat Pivot Transfers     Squat pivot transfers: Total assist;+2 physical assistance;+2 safety/equipment     General transfer comment: Squat pivot on EOB toward L HOB with B knee block and cues for pt to push up to clear buttocks, no activation noted.  Ambulation/Gait                 Stairs             Wheelchair Mobility    Modified Rankin (Stroke Patients Only) Modified Rankin (Stroke Patients Only) Pre-Morbid Rankin Score: No symptoms Modified Rankin: Severe disability     Balance Overall balance assessment: Needs assistance Sitting-balance support: Feet unsupported Sitting balance-Leahy Scale: Poor Sitting balance - Comments: initially light mod A to sity EOB, as he fatigues posterior leans becomes stronger and he requires more support (up to max) we did spend significant time in leaning to left with arm extended, and down on L elbow afterwhich the right pushing improved significantly Postural control: Posterior lean;Right lateral lean Standing balance support: Bilateral upper extremity supported Standing balance-Leahy Scale: Zero Standing balance comment: attempted and pt max to total A +2 for attempt, no-min activation on pt's part noted  Cognition Arousal/Alertness: Lethargic Behavior During Therapy: Flat affect Overall Cognitive Status: Difficult to assess                                 General Comments: no command following today, gaze preference to Left      Exercises Other Exercises Other Exercises: Gentle ranging of B LEs, flexing hip, knees, and ankles simultaneously followed by extending  hips and knees to neutral. Other Exercises: Attempted to have pt push through LEs to extend knees, but pt did not follow commands. Other Exercises: Low load long duration passive stretch applied to neck into R lateral flexion and R rotation 3x ~45 sec and to B ankles into dorsiflexion 1x ~60 sec.    General Comments        Pertinent Vitals/Pain Pain Assessment: Faces Faces Pain Scale: No hurt Pain Intervention(s): Monitored during session    Home Living                      Prior Function            PT Goals (current goals can now be found in the care plan section) Acute Rehab PT Goals Patient Stated Goal: unable to state PT Goal Formulation: Patient unable to participate in goal setting Time For Goal Achievement: 05/21/20 Potential to Achieve Goals: Fair Progress towards PT goals: Not progressing toward goals - comment (due to lethargy/cognition)    Frequency    Min 3X/week      PT Plan Discharge plan needs to be updated    Co-evaluation PT/OT/SLP Co-Evaluation/Treatment: Yes Reason for Co-Treatment: Complexity of the patient's impairments (multi-system involvement);Necessary to address cognition/behavior during functional activity;For patient/therapist safety;To address functional/ADL transfers PT goals addressed during session: Mobility/safety with mobility;Balance OT goals addressed during session: ADL's and self-care;Strengthening/ROM      AM-PAC PT "6 Clicks" Mobility   Outcome Measure  Help needed turning from your back to your side while in a flat bed without using bedrails?: Total Help needed moving from lying on your back to sitting on the side of a flat bed without using bedrails?: Total Help needed moving to and from a bed to a chair (including a wheelchair)?: Total Help needed standing up from a chair using your arms (e.g., wheelchair or bedside chair)?: Total Help needed to walk in hospital room?: Total Help needed climbing 3-5 steps with a  railing? : Total 6 Click Score: 6    End of Session Equipment Utilized During Treatment: Gait belt Activity Tolerance: Other (comment);Patient limited by lethargy (limited by cognition/communication) Patient left: in bed;with call bell/phone within reach;with bed alarm set Nurse Communication: Mobility status;Other (comment) (coughing and gurgly breathing when sitting up) PT Visit Diagnosis: Unsteadiness on feet (R26.81);Muscle weakness (generalized) (M62.81);Difficulty in walking, not elsewhere classified (R26.2);Other symptoms and signs involving the nervous system (R29.898);Hemiplegia and hemiparesis Hemiplegia - Right/Left: Right Hemiplegia - dominant/non-dominant: Dominant Hemiplegia - caused by: Nontraumatic intracerebral hemorrhage     Time: 0820-0903 PT Time Calculation (min) (ACUTE ONLY): 43 min  Charges:  $Therapeutic Exercise: 8-22 mins $Neuromuscular Re-education: 8-22 mins                     Raymond Gurney, PT, DPT Acute Rehabilitation Services  Pager: [phone] Office: [phone]    Jewel Baize 05/12/2020, 12:55 PM

## 2020-05-12 NOTE — Progress Notes (Signed)
Occupational Therapy Treatment Patient Details Name: Alan Everett MRN: [id number] DOB: Jun 04, 1956 Today's Date: 05/12/2020    History of present illness Patient is a 64 y/o male with PMH ETOH use, tobacco abuse, admitted with R side weakness,sensory loss and speech deficits.  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   OT comments  Pt making very little progress towards OT goals, DC updated to SNF which is more appropriate post-acute venue. Pt not tracking visually today or following commands. Pt remains total A for ADL. RUE skin integrity looks good - splint on R bed rail. PROM to RUE at all joints with improved fluidity of movement (decreased tone). Pt is max to total A for all aspects of bed mobility today, able to come EOB and practice sitting with min to max A. Initially strong R pushing which improved with hand position and lateral leaning on L hand and elbow. Cervical stretches done in sitting. Pt very fatigued at end of session, total A +2 to return supine after total A +2 for squat pivot up the bed for repositioning. OT will continue to follow.  Strongly recommend Palliative consult with Health Care POA to discuss goals of care.  Follow Up Recommendations  SNF    Equipment Recommendations  Other (comment) (TBD)    Recommendations for Other Services Other (comment) (Palliative)    Precautions / Restrictions Precautions Precautions: Fall Precaution Comments: pushes to Rt, coretrack, BP <180       Mobility Bed Mobility Overal bed mobility: Needs Assistance Bed Mobility: Supine to Sit;Sit to Supine     Supine to sit: Max assist;+2 for physical assistance Sit to supine: Total assist;+2 for physical assistance   General bed mobility comments: no command following max A to total A for all aspects of bed mobility  Transfers Overall transfer level: Needs assistance Equipment used: 2 person hand held  assist Transfers: Squat Pivot Transfers     Squat pivot transfers: Total assist;+2 physical assistance;+2 safety/equipment          Balance Overall balance assessment: Needs assistance Sitting-balance support: Feet unsupported Sitting balance-Leahy Scale: Poor Sitting balance - Comments: initially light mod A to sity EOB, as he fatigues posterior leans becomes stronger and he requires more support (up to max) we did spend significant time in leaning to left with arm extended, and down on L elbow afterwhich the right pushing improved significantly Postural control: Posterior lean;Right lateral lean Standing balance support: Bilateral upper extremity supported Standing balance-Leahy Scale: Zero Standing balance comment: attempted and pt max to total A +2 for attempt                           ADL either performed or assessed with clinical judgement   ADL                                         General ADL Comments: total A at this time for ADL due to cognition and hemiplegia     Vision   Vision Assessment?: Vision impaired- to be further tested in functional context Additional Comments: no tracking today or ability to bring to midline   Perception     Praxis      Cognition Arousal/Alertness: Lethargic Behavior During Therapy: Flat affect Overall Cognitive Status: Difficult to assess  General Comments: no command following today, gaze preference to Left        Exercises Exercises: Other exercises Other Exercises Other Exercises: gentle passive stretching to RUE at digits, wrist, elbow, and shoulder   Shoulder Instructions       General Comments      Pertinent Vitals/ Pain       Pain Assessment: Faces Faces Pain Scale: No hurt Pain Intervention(s): Monitored during session  Home Living                                          Prior Functioning/Environment               Frequency  Min 2X/week        Progress Toward Goals  OT Goals(current goals can now be found in the care plan section)  Progress towards OT goals: Progressing toward goals  Acute Rehab OT Goals Patient Stated Goal: unable to state OT Goal Formulation: Patient unable to participate in goal setting Time For Goal Achievement: 05/21/20 Potential to Achieve Goals: Fair  Plan Discharge plan needs to be updated;Frequency remains appropriate    Co-evaluation    PT/OT/SLP Co-Evaluation/Treatment: Yes Reason for Co-Treatment: Complexity of the patient's impairments (multi-system involvement);Necessary to address cognition/behavior during functional activity;For patient/therapist safety;To address functional/ADL transfers PT goals addressed during session: Mobility/safety with mobility;Balance;Strengthening/ROM OT goals addressed during session: ADL's and self-care;Strengthening/ROM      AM-PAC OT "6 Clicks" Daily Activity     Outcome Measure   Help from another person eating meals?: Total Help from another person taking care of personal grooming?: Total Help from another person toileting, which includes using toliet, bedpan, or urinal?: Total Help from another person bathing (including washing, rinsing, drying)?: Total Help from another person to put on and taking off regular upper body clothing?: Total Help from another person to put on and taking off regular lower body clothing?: Total 6 Click Score: 6    End of Session Equipment Utilized During Treatment: Gait belt  OT Visit Diagnosis: Other abnormalities of gait and mobility (R26.89);Muscle weakness (generalized) (M62.81);Low vision, both eyes (H54.2);Other symptoms and signs involving cognitive function;Hemiplegia and hemiparesis Hemiplegia - Right/Left: Right   Activity Tolerance Patient tolerated treatment well   Patient Left in bed;with call bell/phone within reach;with bed alarm set;with restraints reapplied    Nurse Communication Mobility status;Other (comment) (breathing sounds wet)        Time: 9983-3825 OT Time Calculation (min): 33 min  Charges: OT General Charges $OT Visit: 1 Visit OT Treatments $Therapeutic Activity: 8-22 mins  Nyoka Cowden OTR/L Acute Rehabilitation Services Pager: [phone] Office: [phone]   Evern Bio Adrien Dietzman 05/12/2020, 9:33 AM

## 2020-05-12 NOTE — TOC Initial Note (Signed)
Transition of Care [facility]) - Initial/Assessment Note    Patient Details  Name: Alan Everett MRN: [id number] Date of Birth: September 27, 1955  [facility] of [facility] CM/SW Contact:    Lawerance Sabal, RN Phone Number: 05/12/2020, 10:57 AM  Clinical Narrative:        Patient admitted w ICH. Positive cocaine on admission. Remains confused, receiving blood today, has cortrak, has needed restraints this admission. CIR currently signing off, may reconsider if mentation improves. SNF placement will be difficult as patient was positive for cocaine and he is uninsured. Financial counselors notified. TOC will continue to follow.            Expected Discharge Plan:  (TBD) Barriers to Discharge: Continued Medical Work up   Patient Goals and CMS Choice        Expected Discharge Plan and Services Expected Discharge Plan:  (TBD)                                              Prior Living Arrangements/Services                       Activities of Daily Living   ADL Screening (condition at time of admission) Patient's cognitive ability adequate to safely complete daily activities?: No Is the patient deaf or have difficulty hearing?: No Does the patient have difficulty seeing, even when wearing glasses/contacts?: No Does the patient have difficulty concentrating, remembering, or making decisions?: No Patient able to express need for assistance with ADLs?: No Does the patient have difficulty dressing or bathing?: Yes Independently performs ADLs?: No Communication: Dependent Is this a change from baseline?: Change from baseline, expected to last >3 days Dressing (OT): Dependent Is this a change from baseline?: Change from baseline, expected to last >3 days Grooming: Dependent Is this a change from baseline?: Change from baseline, expected to last >3 days Feeding: Dependent Is this a change from baseline?: Change from baseline, expected to last >3 days Bathing: Dependent Is  this a change from baseline?: Change from baseline, expected to last >3 days Toileting: Dependent Is this a change from baseline?: Change from baseline, expected to last >3days In/Out Bed: Dependent Is this a change from baseline?: Change from baseline, expected to last >3 days Walks in Home: Dependent Is this a change from baseline?: Change from baseline, expected to last >3 days Does the patient have difficulty walking or climbing stairs?: Yes Weakness of Legs: Right Weakness of Arms/Hands: Right  Permission Sought/Granted                  Emotional Assessment              Admission diagnosis:  ICH (intracerebral hemorrhage) (HCC) [I61.9] Nontraumatic intracerebral hemorrhage, unspecified cerebral location, unspecified laterality [facility]) [I61.9] Patient Active Problem List   Diagnosis Date Noted  . SIRS (systemic inflammatory response syndrome) (HCC)   . Polysubstance abuse (HCC)   . Dysphagia, post-stroke   . Hyperglycemia   . Hypokalemia   . Hypomagnesemia   . ICH (intracerebral hemorrhage) (HCC) 05/05/2020  . Iron deficiency 02/29/2020  . Symptomatic anemia 02/28/2020  . Microcytic anemia 02/28/2020  . Alcohol use 02/28/2020  . Tobacco abuse    PCP:  Patient, No Pcp Per Pharmacy:   [id number] - [place] (N), [place] - [address] [place] (  N)  [address] Phone: [phone] Fax: [phone]     Social Determinants of Health (SDOH) Interventions    Readmission Risk Interventions No flowsheet data found.

## 2020-05-12 NOTE — Progress Notes (Signed)
HOSPITAL MEDICINE OVERNIGHT EVENT NOTE    Notified by nursing patient's hemoglobin today is 6.8, a decrease from 8.7 yesterday.  Nursing reports an episode of melena this evening.  Chart reviewed, note GI following case, patient to undergo EGD in the future once medically stable according to daytime provider note.  Type and screen ordered.  2 unit packed red blood cell transfusion ordered.  Posttransfusion H&H ordered.  Point of contact will need to be consented for blood.    Marinda Elk  MD [facility]

## 2020-05-12 NOTE — Progress Notes (Signed)
  Speech Language Pathology Treatment: Dysphagia;Cognitive-Linquistic  Patient Details Name: Alan Everett MRN: [id number] DOB: 1956/07/03 Today's Date: 05/12/2020 Time: 6269-4854 SLP Time Calculation (min) (ACUTE ONLY): 13 min  Assessment / Plan / Recommendation Clinical Impression  SWALLOWING SLP provide oral care prior to administration of PO trials. Pt not actively resistant to oral care, but did not assist in visualization of oral cavity.  No mandibular excursion to verbal and tactile cuing.  Some thick secretions removed from oral cavity.  Pt would not accept ice chip from spoon.  With small amounts of water by teaspoon there was near total loss of bolus from L side of oral cavity.  There was no appreciable oral transit of any trials and no pharyngeal swallow response was detected.  Recommend pt remain NPO with alternate means of nutrition, hydration, and medications.  COMMUNICATION Pt was alert during today's session and attempted to respond verbally to questions.  Pt vocalized in response to Y/N questions, however, the response was unintelligible and it is not clear if any answers were accurate. Pt did not follow commands for body movements despite maximal verbal, visual, and tactile cuing.  On SLP departure, pt vocalized (again unintelligible) to social conversation.  There was no eye contact or eye tracking noted during today's session with pt gazing to R side throughout.    HPI HPI: Alan Everett is a 64 y.o. male with a PMHx of alcohol use and tobacco abuse who presents acutely to the ED via EMS as a Code Stroke for acute onset of right sided weakness. Patient with disoriented, right leg weakness, right limb ataxia, right decreased sensation and Global aphasia on exam. 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 Worsening of intraventricular      SLP Plan  Continue with current plan of care       Recommendations  Diet recommendations: NPO Medication Administration:  Via alternative means                Oral Care Recommendations: Oral care QID Follow up Recommendations: Inpatient Rehab SLP Visit Diagnosis: Dysphagia, unspecified (R13.10) Plan: Continue with current plan of care       GO                Kerrie Pleasure, MA, CCC-SLP Acute Rehabilitation Services Office: [phone] 05/12/2020, 12:01 PM

## 2020-05-12 NOTE — Progress Notes (Signed)
PROGRESS NOTE    Alan Everett  TGG:[id number] DOB: April 08, 1956 DOA: 05/05/2020 PCP: Patient, No Pcp Per   Brief Narrative:  HPI per [name] on 05/05/20 Alan Everett is an 64 y.o. male with a PMHx of alcohol use and tobacco abuse who presented to the ED via EMS as a Code Stroke for acute onset of right sided weakness. he also had sensory loss and weakness involving his RUE and RLE. 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 Neurosurgery was consulted for EVD consideration which was then deferred.  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  Transferred under [facility] on 05/09/2020.  On 05/08/2020, patient spiked fever and he was started on broad-spectrum antibiotics/IV Zosyn empirically.  Despite of antibiotics, patient's white blood cells as well as fever continue to get worse with a T-max of 103.2 at around noon on 05/11/2020.  Previous hospitalist [name] had discussed case with ID who recommended scanning his chest and abdomen and continuing antibiotics.  CT abdomen chest and pelvis shows bibasilar infiltrates suspecting possible pneumonia but no other pathology. 2D Echo EF 60-65%. UDS positive for cocaine  CT head: 1. 2.3 x 2.1 x 2.9 cm acute intraparenchymal hemorrhage centered at the left thalamic capsular region, estimated volume 7 cc. Minimal surrounding vasogenic edema without significant regional mass effect or midline shift. Associated intraventricular extension with small volume intraventricular blood within the adjacent left lateral ventricle. No hydrocephalus or ventricular trapping. 2. No other acute intracranial abnormality. 3. Underlying age-related cerebral atrophy with mild chronic small vessel ischemic disease.    His oral iron was stopped yesterday and GI was consulted given concern of positive FOBT and drop in hemoglobin.  He is at high risk for  getting an EGD in the setting of his acute CVA and lack of overt bleeding.  They are recommending cross-sectional imaging to exclude large GI lesions within the small bowel.  They recommend if patient has progressive anemia, transfusion dependent or requirements they will consider an EGD with VCE placement given that he is unable to swallow.  [name] believes that he could have small bowel pathology and could be having some angiectasia's that have been bleeding given that he had recently had an EGD and colon a few months ago.  Assessment & Plan:   Active Problems:   ICH (intracerebral hemorrhage) (HCC)   SIRS (systemic inflammatory response syndrome) (HCC)   Polysubstance abuse (HCC)   Dysphagia, post-stroke   Hyperglycemia   Hypokalemia   Hypomagnesemia  Acute Hemorrhagic Stroke/cerebral edema - L BG ICH w/ IVH d/t severe hypertension in setting of alcohol and cocaine use -Code Stroke CT head 10/6 1844 L thalamocapsular IPH 7cc w/ minimal edema, mild IVH. Small vessel disease. Atrophy.  -CT head 10/6 2042 enlarging L thalamocapsular IPH w/ increasing IVH from L ventricle into 3rd and 4th ventricles. -CT head 10/7 0510 stable L thalamic IPH w/ IVH w/ clearing of blood in 4th ventricle.  -CTA head and neck 10/8 unremarkable angio, slight increase in midline shift and ventriculomegaly -MRI not able to perform due to no family contact to clear for metal -Repeat CT head 10/9 suboptimal but stable -2D Echo EF 60-65% -LDL 80 -HgbA1c 4.6 -UDS positive for cocaine -VTE prophylaxis - SCDs  -No antithrombotic prior to admission, now on No antithrombotic given IPH -Therapy recommendations:  CIR vs SNF and will go to CIR once more medically  stable Neurology felt that his exam is essentially unchanged and recommending CIR when medically stable for discharge.  Now that he is having recurrent fever, I am going to repeat his CT head without contrast today.  I have also switched him from broad-spectrum  Zosyn to Rocephin and azithromycin for possible aspiration pneumonia.  Checked his procalcitonin which is only 0.21.  Hypertensive Emergency -Per EMS, SBP > 200  -Home meds:  none -Weaned off off Cleviprex. -Hydralazine IV PRN -Now On Amlodipine 10 and lisinopril 20 bid -SBP goal < 160 -Long-term BP goal normotensive; Blood pressure very well controlled.  Hyperlipidemia -Home meds:  None -LDL 80, goal < 70 -No statin in setting of IPH -Mild transaminitis, likely related to alcohol, AST/ALT 68/64 ->52/47 -> 35/45 -> and is Now worsening and is 90/78 respectively -Will Consider statin low dose on discharge with normalized liver enzymes and defer to Neurology to initiate   Dysphagia -Secondary to stroke -C/w NPO; Will need SLP to Follow -On IVF with NS at 30 mL/hr which will now be discontinued -Cortrak place -On TF @ 65 -Continue to Monitor Progress and SLP recommends n.p.o. still   Tobacco Abuse -Patient is a current smoker and will need smoking cessation counseling given once he is more awake  Cocaine abuse -UDS positive for cocaine -Cocaine cessation will be provided when he is more awake -Avoid beta-blocker  Hypophosphatemia Resolved.  Hypokalemia Resolved.  Normocytic Anemia -Patient's Hgb/Hct trending down from 12.0/38.3 -> 11.9/37.9 -> 10.7/34.1 and is trending down to 9.5/30.6 -> 8.7/28.4> 6.8/22.1 -He is having black stools so iron supplementation was stopped.  FOBT positive.  -Gastroenterology were consulted and recently had an EGD and colonoscopy over the last 2 months.  [name] recommends next up in evaluating would be a video capsule endoscopy but I cannot be formed due to his recent CVA and dysphagia and since his inability swallowed it would have to be done endoscopically and placed an EGD.  Currently endoscopic procedures are high risk with his recent acute CVA and anything small bowel pathology would require a deep enteroscopy or may not be reachable  via endoscopically and likely the pathology would be intestinal angiectasia's.  [name] recommends continue to monitor carefully. 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  Hemoglobin 6.8 today.  1 unit of PRBC transfusion ordered.  Will monitor over next 20 to 48 hours.  If recurrent transfusion needed, will reach out to GI again.  SIRS with Leukocytosis and Fever in the setting of Aspiration Pneumonia, worsening -Likely in the setting of aspiration pneumonia but will need to rule out other etiologies -CT chest shows bibasilar pneumonia.  CT abdomen pelvis negative.  Culture negative so far.  Continues to have fever and worsening leukocytosis.  I switched his antibiotics from Zosyn to Rocephin and azithromycin this morning.  I have also consulted ID.    GERD -C/w Pantorpazole 40 mg per Tube Daily qHS and will increase to twice daily   Hypernatremia/hyperchloremia -Mild hyperchloremia but hyponatremia resolved.  Thrombocytopenia Resolved.  Hyperglycemia  -Likely reactive on tube feedings and his hemoglobin A1c is 4.6 Will start on Lantus 10 units.  DVT prophylaxis: SCDs Code Status: FULL CODE Family Communication: No family present at bedside.  Discussed with his wife over the phone. Disposition Plan: Need further clinical improvement back to baseline and anticipate discharge to CIR versus SNF  once medically stable  Status is: Inpatient  Remains inpatient appropriate because:Altered mental status, Unsafe d/c plan, IV treatments appropriate due to intensity of illness or inability to take PO and Inpatient level of care appropriate due to severity of illness   Dispo: The patient is from: Home              Anticipated d/c is to: CIR vs. SNF              Anticipated d/c date is: 3 days              Patient currently is not medically stable to  d/c.  Consultants:   Neurology  Gastroenterology  Case was discussed with infectious diseases Dr. Daiva Eves  Procedures:  ECHOCARDIOGRAM IMPRESSIONS    1. Left ventricular ejection fraction, by estimation, is 60 to 65%. The  left ventricle has normal function. The left ventricle has no regional  wall motion abnormalities. Left ventricular diastolic parameters are  consistent with Grade I diastolic  dysfunction (impaired relaxation).  2. Right ventricular systolic function is normal. The right ventricular  size is normal. Tricuspid regurgitation signal is inadequate for assessing  PA pressure.  3. The mitral valve is normal in structure. No evidence of mitral valve  regurgitation. No evidence of mitral stenosis.  4. The aortic valve is tricuspid. Aortic valve regurgitation is not  visualized. No aortic stenosis is present.  5. The inferior vena cava is normal in size with greater than 50%  respiratory variability, suggesting right atrial pressure of 3 mmHg.   FINDINGS  Left Ventricle: Left ventricular ejection fraction, by estimation, is 60  to 65%. The left ventricle has normal function. The left ventricle has no  regional wall motion abnormalities. The left ventricular internal cavity  size was normal in size. There is  no left ventricular hypertrophy. Left ventricular diastolic parameters  are consistent with Grade I diastolic dysfunction (impaired relaxation).   Right Ventricle: The right ventricular size is normal. No increase in  right ventricular wall thickness. Right ventricular systolic function is  normal. Tricuspid regurgitation signal is inadequate for assessing PA  pressure.   Left Atrium: Left atrial size was normal in size.   Right Atrium: Right atrial size was normal in size.   Pericardium: There is no evidence of pericardial effusion.   Mitral Valve: The mitral valve is normal in structure. No evidence of  mitral valve regurgitation. No evidence  of mitral valve stenosis.   Tricuspid Valve: The tricuspid valve is normal in structure. Tricuspid  valve regurgitation is not demonstrated.   Aortic Valve: The aortic valve is tricuspid. Aortic valve regurgitation is  not visualized. No aortic stenosis is present.   Pulmonic Valve: The pulmonic valve was normal in structure. Pulmonic valve  regurgitation is not visualized.   Aorta: The aortic root is normal in size and structure.   Venous: The inferior vena cava is normal in size with greater than 50%  respiratory variability, suggesting right atrial pressure of 3 mmHg.   IAS/Shunts: No atrial level shunt detected by color flow Doppler.     LEFT VENTRICLE  PLAX 2D  LVIDd:     4.30 cm Diastology  LVIDs:     2.70 cm LV e' medial:  8.49 cm/s  LV PW:     1.00 cm LV E/e' medial: 9.2  LV IVS:    0.90 cm LV e' lateral:  7.46 cm/s  LVOT diam:   2.20 cm LV E/e' lateral: 10.4  LV SV:     98  LV SV Index:  57  LVOT Area:   3.80 cm     RIGHT VENTRICLE  RV S prime:   13.90 cm/s  TAPSE (M-mode): 2.0 cm   LEFT ATRIUM       Index    RIGHT ATRIUM      Index  LA diam:    3.10 cm 1.78 cm/m RA Area:   11.60 cm  LA Vol (A2C):  40.9 ml 23.49 ml/m RA Volume:  24.60 ml 14.13 ml/m  LA Vol (A4C):  37.9 ml 21.77 ml/m  LA Biplane Vol: 43.0 ml 24.69 ml/m  AORTIC VALVE  LVOT Vmax:  120.00 cm/s  LVOT Vmean: 75.400 cm/s  LVOT VTI:  0.259 m    AORTA  Ao Root diam: 3.20 cm   MITRAL VALVE  MV Area (PHT): 2.80 cm  SHUNTS  MV Decel Time: 271 msec  Systemic VTI: 0.26 m  MV E velocity: 77.70 cm/s Systemic Diam: 2.20 cm  MV A velocity: 84.50 cm/s  MV E/A ratio: 0.92   Antimicrobials:  Anti-infectives (From admission, onward)   Start     Dose/Rate Route Frequency Ordered Stop   05/12/20 0900  cefTRIAXone (ROCEPHIN) 1 g in sodium chloride 0.9 % 100 mL IVPB        1 g 200 mL/hr over 30 Minutes Intravenous Daily  05/12/20 0817     05/12/20 0900  azithromycin (ZITHROMAX) 500 mg in sodium chloride 0.9 % 250 mL IVPB        500 mg 250 mL/hr over 60 Minutes Intravenous Daily 05/12/20 0817     05/09/20 0930  Ampicillin-Sulbactam (UNASYN) 3 g in sodium chloride 0.9 % 100 mL IVPB  Status:  Discontinued        3 g 200 mL/hr over 30 Minutes Intravenous Every 8 hours 05/09/20 0837 05/09/20 0839   05/09/20 0930  Ampicillin-Sulbactam (UNASYN) 3 g in sodium chloride 0.9 % 100 mL IVPB  Status:  Discontinued        3 g 200 mL/hr over 30 Minutes Intravenous Every 6 hours 05/09/20 0839 05/12/20 0817        Subjective: Patient seen and examined.  Encephalopathy.  Almost aphasic or severely dysarthric.  Unable to comprehend his mumbling speech.  Moving left lower extremity a little bit but no movement on the right lower extremity and right upper extremity.  Objective: Vitals:   05/12/20 0821 05/12/20 0918 05/12/20 1017 05/12/20 1045  BP: (!) 142/65  (!) 147/58 130/65  Pulse: (!) 108 (!) 112 (!) 111 (!) 113  Resp: Temp: 98.2 F (36.8 C)  99.9 F (37.7 C) (!) 100.9 F (38.3 C)  TempSrc: Axillary  Oral Oral  SpO2: 100% 97% 99% 99%  Weight:        Intake/Output Summary (Last 24 hours) at 05/12/2020 1052 Last data filed at 05/12/2020 0730 Gross per 24 hour  Intake --  Output 1550 ml  Net -1550 ml   Filed Weights   05/10/20 0500 05/11/20 0331 05/12/20 0434  Weight: 61.2 kg 61.4 kg 62.5 kg   Examination: Physical Exam:  General exam: Appears calm and comfortable but encephalopathy. Respiratory system: Diminished with rhonchi at the bases bilaterally. Respiratory effort normal. Cardiovascular system: S1 & S2 heard, RRR. No JVD, murmurs, rubs, gallops or clicks. No pedal edema. Gastrointestinal system: Abdomen is nondistended, soft and nontender. No organomegaly or masses felt. Normal bowel sounds heard. Central nervous system: Alert but  not oriented.  Only follows commands of opening eyes.   Will intermittently follow command to move his left lower extremity.  Has global aphasia, mumbles some words but incomprehensible.  4 out of 5 left upper extremity and 3/5 left lower extremity.  0/5 in right upper and lower extremity. Skin: No rashes, lesions or ulcers.    Data Reviewed: I have personally reviewed following labs and imaging studies  CBC: Recent Labs  Lab 05/05/20 2003 05/05/20 2003 05/06/20 0738 05/07/20 0337 05/08/20 1736 05/09/20 0500 05/10/20 0200 05/11/20 0637 05/12/20 0510  WBC 8.8   < > 15.9*   < > 24.1* 26.7* 26.5* 29.3* 31.9*  NEUTROABS 6.6  --  13.7*  --   --   --  20.0* 21.9* 25.3*  HGB 12.3*   < > 11.6*   < > 11.9* 10.7* 9.5* 8.7* 6.8*  HCT 40.8   < > 36.2*   < > 37.9* 34.1* 30.6* 28.4* 22.1*  MCV 92.7   < > 91.6   < > 92.0 90.9 92.4 93.7 93.6  PLT 149*   < > 154   < > 150 144* 143* 177 189   < > = values in this interval not displayed.   Basic Metabolic Panel: Recent Labs  Lab 05/08/20 1032 05/08/20 1032 05/08/20 1736 05/09/20 0500 05/09/20 0827 05/10/20 0200 05/11/20 0637 05/12/20 0510  NA 138  --   --  141  --  142 146* 145  K 3.1*  --   --  3.3*  --  3.4* 4.1 3.8  CL 106  --   --  109  --  110 116* 115*  CO2 21*  --   --  21*  --  23 19* 22  GLUCOSE 139*  --   --  150*  --  146* 157* 205*  BUN 16  --   --  24*  --  24* 21 25*  CREATININE 0.79  --   --  0.96  --  0.90 0.91 1.08  CALCIUM 9.3  --   --  8.7*  --  8.5* 8.9 8.5*  MG 1.9   < > 1.9  --  2.2 2.3 2.1 2.3  PHOS 2.0*   < > 1.5*  --  2.0* 2.6 3.0 3.1   < > = values in this interval not displayed.   GFR: Estimated Creatinine Clearance: 61.1 mL/min (by C-G formula based on SCr of 1.08 mg/dL). Liver Function Tests: Recent Labs  Lab 05/06/20 0738 05/09/20 0827 05/10/20 0200 05/11/20 0637 05/12/20 0510  AST 52* 35 39 90* 118*  ALT 47* 45* 40 78* 86*  ALKPHOS 73 70 63 71 58  BILITOT 0.6 0.5 0.2* 0.7 0.7  PROT 7.6 7.5 7.1 7.8 6.7  ALBUMIN 3.5 2.8* 2.5* 2.6* 2.2*   No  results for input(s): LIPASE, AMYLASE in the last 168 hours. No results for input(s): AMMONIA in the last 168 hours. Coagulation Profile: Recent Labs  Lab 05/05/20 1930  INR 1.2   Cardiac Enzymes: No results for input(s): CKTOTAL, CKMB, CKMBINDEX, TROPONINI in the last 168 hours. BNP (last 3 results) No results for input(s): PROBNP in the last 8760 hours. HbA1C: No results for input(s): HGBA1C in the last 72 hours. CBG: Recent Labs  Lab 05/11/20 1651 05/11/20 2021 05/11/20 2324 05/12/20 0349 05/12/20 0816  GLUCAP 100* 204* 197* 193* 203*   Lipid Profile: No results for input(s): CHOL, HDL, LDLCALC, TRIG, CHOLHDL, LDLDIRECT in the last 72 hours. Thyroid Function  Tests: No results for input(s): TSH, T4TOTAL, FREET4, T3FREE, THYROIDAB in the last 72 hours. Anemia Panel: Recent Labs    05/11/20 0637  VITAMINB12 377  FOLATE 8.0  FERRITIN 254  TIBC 276  IRON 39*  RETICCTPCT 2.0   Sepsis Labs: Recent Labs  Lab 05/12/20 0510  PROCALCITON 0.21    Recent Results (from the past 240 hour(s))  Respiratory Panel by RT PCR (Flu A&B, Covid) - Nasopharyngeal Swab     Status: None   Collection Time: 05/05/20  6:46 PM   Specimen: Nasopharyngeal Swab  Result Value Ref Range Status   SARS Coronavirus 2 by RT PCR NEGATIVE NEGATIVE Final    Comment: (NOTE) SARS-CoV-2 target nucleic acids are NOT DETECTED.  The SARS-CoV-2 RNA is generally detectable in upper respiratoy specimens during the acute phase of infection. The lowest concentration of SARS-CoV-2 viral copies this assay can detect is 131 copies/mL. A negative result does not preclude SARS-Cov-2 infection and should not be used as the sole basis for treatment or other patient management decisions. A negative result may occur with  improper specimen collection/handling, submission of specimen other than nasopharyngeal swab, presence of viral mutation(s) within the areas targeted by this assay, and inadequate number of  viral copies (<131 copies/mL). A negative result must be combined with clinical observations, patient history, and epidemiological information. The expected result is Negative.  Fact Sheet for Patients:  https://www.moore.com/  Fact Sheet for Healthcare Providers:  https://www.young.biz/  This test is no t yet approved or cleared by the [place] FDA and  has been authorized for detection and/or diagnosis of SARS-CoV-2 by FDA under an Emergency Use Authorization (EUA). This EUA will remain  in effect (meaning this test can be used) for the duration of the COVID-19 declaration under Section 564(b)(1) of the Act, 21 U.S.C. section 360bbb-3(b)(1), unless the authorization is terminated or revoked sooner.     Influenza A by PCR NEGATIVE NEGATIVE Final   Influenza B by PCR NEGATIVE NEGATIVE Final    Comment: (NOTE) The Xpert Xpress SARS-CoV-2/FLU/RSV assay is intended as an aid in  the diagnosis of influenza from Nasopharyngeal swab specimens and  should not be used as a sole basis for treatment. Nasal washings and  aspirates are unacceptable for Xpert Xpress SARS-CoV-2/FLU/RSV  testing.  Fact Sheet for Patients: https://www.moore.com/  Fact Sheet for Healthcare Providers: https://www.young.biz/  This test is not yet approved or cleared by the [place] FDA and  has been authorized for detection and/or diagnosis of SARS-CoV-2 by  FDA under an Emergency Use Authorization (EUA). This EUA will remain  in effect (meaning this test can be used) for the duration of the  Covid-19 declaration under Section 564(b)(1) of the Act, 21  U.S.C. section 360bbb-3(b)(1), unless the authorization is  terminated or revoked. Performed at [facility], [address], [place], [place] [address]   MRSA PCR Screening     Status: None   Collection Time: 05/05/20 11:56 PM   Specimen: Nasal Mucosa;  Nasopharyngeal  Result Value Ref Range Status   MRSA by PCR NEGATIVE NEGATIVE Final    Comment:        The GeneXpert MRSA Assay (FDA approved for NASAL specimens only), is one component of a comprehensive MRSA colonization surveillance program. It is not intended to diagnose MRSA infection nor to guide or monitor treatment for MRSA infections. Performed at [facility], [address], [place], [place] [address]   Culture, blood (Routine X 2)  w Reflex to ID Panel     Status: None   Collection Time: 05/07/20  7:59 PM   Specimen: BLOOD LEFT HAND  Result Value Ref Range Status   Specimen Description BLOOD LEFT HAND  Final   Special Requests   Final    BOTTLES DRAWN AEROBIC AND ANAEROBIC Blood Culture adequate volume   Culture   Final    NO GROWTH 5 DAYS Performed at [facility], [address] [place], [place] [address]    Report Status 05/12/2020 FINAL  Final  Culture, blood (Routine X 2) w Reflex to ID Panel     Status: None   Collection Time: 05/07/20  8:06 PM   Specimen: BLOOD RIGHT HAND  Result Value Ref Range Status   Specimen Description BLOOD RIGHT HAND  Final   Special Requests   Final    BOTTLES DRAWN AEROBIC AND ANAEROBIC Blood Culture adequate volume   Culture   Final    NO GROWTH 5 DAYS Performed at [facility], [address] [place], [place] [address]    Report Status 05/12/2020 FINAL  Final  Culture, blood (routine x 2)     Status: None (Preliminary result)   Collection Time: 05/09/20  8:27 AM   Specimen: BLOOD  Result Value Ref Range Status   Specimen Description BLOOD RIGHT ANTECUBITAL  Final   Special Requests   Final    AEROBIC BOTTLE ONLY Blood Culture results may not be optimal due to an inadequate volume of blood received in culture bottles   Culture   Final    NO GROWTH 3 DAYS Performed at [facility], [address] [place] [address] [place], [place] [address]    Report Status PENDING  Incomplete  Culture, blood (routine x 2)      Status: None (Preliminary result)   Collection Time: 05/09/20  8:27 AM   Specimen: BLOOD  Result Value Ref Range Status   Specimen Description BLOOD RIGHT ANTECUBITAL  Final   Special Requests   Final    AEROBIC BOTTLE ONLY Blood Culture results may not be optimal due to an inadequate volume of blood received in culture bottles   Culture   Final    NO GROWTH 3 DAYS Performed at [facility], [address] [place], [place] [address]    Report Status PENDING  Incomplete  Culture, Urine     Status: Abnormal   Collection Time: 05/09/20  8:40 AM   Specimen: Urine, Random  Result Value Ref Range Status   Specimen Description URINE, RANDOM  Final   Special Requests   Final    NONE Performed at [facility], [address] [place], [place] [address]    Culture >=100,000 COLONIES/mL KLEBSIELLA PNEUMONIAE (A)  Final   Report Status 05/11/2020 FINAL  Final   Organism ID, Bacteria KLEBSIELLA PNEUMONIAE (A)  Final      Susceptibility   Klebsiella pneumoniae - MIC*    AMPICILLIN RESISTANT Resistant     CEFAZOLIN <=4 SENSITIVE Sensitive     CEFTRIAXONE <=0.25 SENSITIVE Sensitive     CIPROFLOXACIN <=0.25 SENSITIVE Sensitive     GENTAMICIN <=1 SENSITIVE Sensitive     IMIPENEM <=0.25 SENSITIVE Sensitive     NITROFURANTOIN 32 SENSITIVE Sensitive     TRIMETH/SULFA <=20 SENSITIVE Sensitive     AMPICILLIN/SULBACTAM 4 SENSITIVE Sensitive     PIP/TAZO <=4 SENSITIVE Sensitive     * >=100,000 COLONIES/mL KLEBSIELLA PNEUMONIAE     RN  Pressure Injury Documentation:     Estimated body mass index is 22.24 kg/m as calculated from the following:   Height as of 04/09/20:  (1.676 m).   Weight as of this encounter: 62.5 kg.  Malnutrition Type:  Nutrition Problem: Inadequate oral intake Etiology: dysphagia, acute illness   Malnutrition Characteristics:  Signs/Symptoms: NPO status   Nutrition Interventions:  Interventions: Tube feeding     Radiology Studies: CT CHEST W  CONTRAST  Result Date: 05/11/2020 CLINICAL DATA:  64 year old male with abdominal pain. Concern for pneumonia effusion, or abscess. EXAM: CT CHEST, ABDOMEN, AND PELVIS WITH CONTRAST TECHNIQUE: Multidetector CT imaging of the chest, abdomen and pelvis was performed following the standard protocol during bolus administration of intravenous contrast. CONTRAST:  OMNIPAQUE IOHEXOL 300 MG/ML  SOLN COMPARISON:  Chest radiograph dated 05/11/2020. FINDINGS: CT CHEST FINDINGS Cardiovascular: There is no cardiomegaly or pericardial effusion. Coronary vascular calcification with predominant involvement of the LAD and right coronary artery. There is mild atherosclerotic calcification of the thoracic aorta. No aneurysmal dilatation or dissection. The origins of the great vessels of the aortic arch appear patent. Evaluation of the pulmonary arteries is limited due to respiratory motion artifact and suboptimal opacification. No large central pulmonary artery embolus identified. Mediastinum/Nodes: No hilar or mediastinal adenopathy. An enteric tube is noted extending to the distal stomach. The thyroid gland is grossly unremarkable. No mediastinal fluid collection. Lungs/Pleura: Bilateral lower lobe predominant pulmonary opacities most consistent with multifocal pneumonia, possibly atypical in etiology. Clinical correlation is recommended. There is no pleural effusion pneumothorax. The central airways are patent. Musculoskeletal: No chest wall mass or suspicious bone lesions identified. CT ABDOMEN PELVIS FINDINGS No intra-abdominal free air or free fluid. Hepatobiliary: Probable background of mild fatty liver. Ill-defined hypodense lesion in the anterior liver measuring 13 x 20 mm as well as an 18 x 19 mm lesion along the falciform ligament are not characterized on this CT. Further evaluation with MRI without and with contrast on a nonemergent/outpatient basis recommended. There is no intrahepatic biliary ductal dilatation.  The gallbladder is unremarkable. Pancreas: Unremarkable. No pancreatic ductal dilatation or surrounding inflammatory changes. Spleen: Normal in size without focal abnormality. Adrenals/Urinary Tract: The adrenal glands unremarkable. There is no hydronephrosis on either side. There is symmetric enhancement and excretion of contrast by both kidneys. The visualized ureters and urinary bladder appear unremarkable. Stomach/Bowel: Feeding tube with tip in the distal stomach. There is no bowel obstruction or active inflammation. The appendix is normal. Vascular/Lymphatic: Advanced atherosclerotic calcification of the distal aorta and iliac arteries. The IVC is unremarkable. No portal venous gas. There is no adenopathy. Reproductive: The prostate and seminal vesicles are grossly unremarkable. No pelvic mass. Other: Small fat containing right inguinal hernia. Musculoskeletal: No acute or significant osseous findings. IMPRESSION: 1. Bilateral lower lobe predominant pulmonary opacities most consistent with multifocal pneumonia, possibly atypical in etiology. Clinical correlation is recommended. 2. No acute intra-abdominal or pelvic pathology. No bowel obstruction. Normal appendix. 3. Indeterminate hepatic lesions. Further characterization with MRI without and with contrast recommended. 4. Aortic Atherosclerosis (ICD10-I70.0). Electronically Signed   By: Elgie Collard M.D.   On: 05/11/2020 19:05   CT ABDOMEN PELVIS W CONTRAST  Result Date: 05/11/2020 CLINICAL DATA:  64 year old male with abdominal pain. Concern for pneumonia effusion, or abscess. EXAM: CT CHEST, ABDOMEN, AND PELVIS WITH CONTRAST TECHNIQUE: Multidetector CT imaging of the chest, abdomen and pelvis was performed following the standard protocol during bolus administration of intravenous contrast. CONTRAST:  OMNIPAQUE IOHEXOL 300 MG/ML  SOLN COMPARISON:  Chest radiograph dated 05/11/2020. FINDINGS: CT CHEST FINDINGS Cardiovascular: There is no  cardiomegaly or pericardial effusion. Coronary vascular calcification with predominant involvement of the LAD and right coronary artery. There is mild atherosclerotic calcification of the thoracic aorta. No aneurysmal dilatation or dissection. The origins of the great vessels of the aortic arch appear patent. Evaluation of the pulmonary arteries is limited due to respiratory motion artifact and suboptimal opacification. No large central pulmonary artery embolus identified. Mediastinum/Nodes: No hilar or mediastinal adenopathy. An enteric tube is noted extending to the distal stomach. The thyroid gland is grossly unremarkable. No mediastinal fluid collection. Lungs/Pleura: Bilateral lower lobe predominant pulmonary opacities most consistent with multifocal pneumonia, possibly atypical in etiology. Clinical correlation is recommended. There is no pleural effusion pneumothorax. The central airways are patent. Musculoskeletal: No chest wall mass or suspicious bone lesions identified. CT ABDOMEN PELVIS FINDINGS No intra-abdominal free air or free fluid. Hepatobiliary: Probable background of mild fatty liver. Ill-defined hypodense lesion in the anterior liver measuring 13 x 20 mm as well as an 18 x 19 mm lesion along the falciform ligament are not characterized on this CT. Further evaluation with MRI without and with contrast on a nonemergent/outpatient basis recommended. There is no intrahepatic biliary ductal dilatation. The gallbladder is unremarkable. Pancreas: Unremarkable. No pancreatic ductal dilatation or surrounding inflammatory changes. Spleen: Normal in size without focal abnormality. Adrenals/Urinary Tract: The adrenal glands unremarkable. There is no hydronephrosis on either side. There is symmetric enhancement and excretion of contrast by both kidneys. The visualized ureters and urinary bladder appear unremarkable. Stomach/Bowel: Feeding tube with tip in the distal stomach. There is no bowel obstruction or  active inflammation. The appendix is normal. Vascular/Lymphatic: Advanced atherosclerotic calcification of the distal aorta and iliac arteries. The IVC is unremarkable. No portal venous gas. There is no adenopathy. Reproductive: The prostate and seminal vesicles are grossly unremarkable. No pelvic mass. Other: Small fat containing right inguinal hernia. Musculoskeletal: No acute or significant osseous findings. IMPRESSION: 1. Bilateral lower lobe predominant pulmonary opacities most consistent with multifocal pneumonia, possibly atypical in etiology. Clinical correlation is recommended. 2. No acute intra-abdominal or pelvic pathology. No bowel obstruction. Normal appendix. 3. Indeterminate hepatic lesions. Further characterization with MRI without and with contrast recommended. 4. Aortic Atherosclerosis (ICD10-I70.0). Electronically Signed   By: Elgie Collard M.D.   On: 05/11/2020 19:05   DG CHEST PORT 1 VIEW  Result Date: 05/12/2020 CLINICAL DATA:  Shortness of breath. EXAM: PORTABLE CHEST 1 VIEW COMPARISON:  May 11, 2020. FINDINGS: The heart size and mediastinal contours are within normal limits. Feeding tube is seen entering stomach. No pneumothorax is noted. Mild bibasilar subsegmental atelectasis or infiltrates are noted. The visualized skeletal structures are unremarkable. IMPRESSION: Mild bibasilar subsegmental atelectasis or infiltrates are noted. Electronically Signed   By: Lupita Raider M.D.   On: 05/12/2020 08:37   DG CHEST PORT 1 VIEW  Result Date: 05/11/2020 CLINICAL DATA:  Shortness of breath EXAM: PORTABLE CHEST 1 VIEW COMPARISON:  05/10/2020 FINDINGS: Enteric tube passes into the distal stomach with tip out of field of view. Patchy increased density, left greater than right. No pleural effusion. No pneumothorax. Stable cardiomediastinal contours. IMPRESSION: Persistent patchy increased density, left greater than right, which may reflect pneumonia. Electronically Signed   By:  Guadlupe Spanish M.D.   On: 05/11/2020 08:11   Scheduled Meds:  amLODipine  10 mg Per Tube Daily   chlorhexidine  15 mL Mouth Rinse BID   Chlorhexidine Gluconate Cloth  6 each Topical Daily   insulin aspart  0-9 Units Subcutaneous Q4H   insulin glargine  10 Units Subcutaneous Daily   ipratropium  0.5 mg Nebulization BID   levalbuterol  0.63 mg Nebulization BID   lisinopril  20 mg Per Tube BID   mouth rinse  15 mL Mouth Rinse q12n4p   pantoprazole sodium  40 mg Per Tube BID   senna-docusate  1 tablet Per Tube BID   sodium chloride flush  10-40 mL Intracatheter Q12H   Continuous Infusions:  azithromycin 500 mg (05/12/20 1003)   cefTRIAXone (ROCEPHIN)  IV     dextrose 75 mL/hr at 05/12/20 0959   feeding supplement (OSMOLITE 1.2 CAL) 1,000 mL (05/12/20 0106)    LOS: 7 days   Hughie Closs, MD [facility] PAGER is on AMION  If 7PM-7AM, please contact night-coverage [email or web address]

## 2020-05-12 NOTE — Progress Notes (Signed)
STROKE TEAM PROGRESS NOTE   INTERVAL HISTORY Patient's fever continues and is 100.9 today.. White count has gone up to 31 and hemoglobin is 6.8. He is awake and alert but remains aphasic and barely follows commands.  Neurological exam is unchanged.  Vital signs are stable except for fever and tachycardia. Chest abdomen and pelvis CT scan shows only multifocal lung opacities but no intraabdominal or pelvic abnormality. Vitals:   05/12/20 0821 05/12/20 0918 05/12/20 1017 05/12/20 1045  BP: (!) 142/65  (!) 147/58 130/65  Pulse: (!) 108 (!) 112 (!) 111 (!) 113  Resp: 18 18 18 20   Temp: 98.2 F (36.8 C)  99.9 F (37.7 C) (!) 100.9 F (38.3 C)  TempSrc: Axillary  Oral Oral  SpO2: 100% 97% 99% 99%  Weight:       CBC:  Recent Labs  Lab 05/11/20 0637 05/12/20 0510  WBC 29.3* 31.9*  NEUTROABS 21.9* 25.3*  HGB 8.7* 6.8*  HCT 28.4* 22.1*  MCV 93.7 93.6  PLT 177 189   Basic Metabolic Panel:  Recent Labs  Lab 05/11/20 0637 05/12/20 0510  NA 146* 145  K 4.1 3.8  CL 116* 115*  CO2 19* 22  GLUCOSE 157* 205*  BUN 21 25*  CREATININE 0.91 1.08  CALCIUM 8.9 8.5*  MG 2.1 2.3  PHOS 3.0 3.1   Lipid Panel:  Recent Labs  Lab 05/05/20 2003  CHOL 146  TRIG 87  HDL 49  CHOLHDL 3.0  VLDL 17  LDLCALC 80   HgbA1c:  Recent Labs  Lab 05/05/20 2004  HGBA1C 4.6*   Urine Drug Screen:  Recent Labs  Lab 05/06/20 0752  LABOPIA NONE DETECTED  COCAINSCRNUR POSITIVE*  LABBENZ NONE DETECTED  AMPHETMU NONE DETECTED  THCU NONE DETECTED  LABBARB NONE DETECTED    Alcohol Level  Recent Labs  Lab 05/05/20 2003  ETH <10    IMAGING past 24 hours CT CHEST W CONTRAST  Result Date: 05/11/2020 CLINICAL DATA:  64 year old male with abdominal pain. Concern for pneumonia effusion, or abscess. EXAM: CT CHEST, ABDOMEN, AND PELVIS WITH CONTRAST TECHNIQUE: Multidetector CT imaging of the chest, abdomen and pelvis was performed following the standard protocol during bolus administration of  intravenous contrast. CONTRAST:  77 OMNIPAQUE IOHEXOL 300 MG/ML  SOLN COMPARISON:  Chest radiograph dated 05/11/2020. FINDINGS: CT CHEST FINDINGS Cardiovascular: There is no cardiomegaly or pericardial effusion. Coronary vascular calcification with predominant involvement of the LAD and right coronary artery. There is mild atherosclerotic calcification of the thoracic aorta. No aneurysmal dilatation or dissection. The origins of the great vessels of the aortic arch appear patent. Evaluation of the pulmonary arteries is limited due to respiratory motion artifact and suboptimal opacification. No large central pulmonary artery embolus identified. Mediastinum/Nodes: No hilar or mediastinal adenopathy. An enteric tube is noted extending to the distal stomach. The thyroid gland is grossly unremarkable. No mediastinal fluid collection. Lungs/Pleura: Bilateral lower lobe predominant pulmonary opacities most consistent with multifocal pneumonia, possibly atypical in etiology. Clinical correlation is recommended. There is no pleural effusion pneumothorax. The central airways are patent. Musculoskeletal: No chest wall mass or suspicious bone lesions identified. CT ABDOMEN PELVIS FINDINGS No intra-abdominal free air or free fluid. Hepatobiliary: Probable background of mild fatty liver. Ill-defined hypodense lesion in the anterior liver measuring 13 x 20 mm as well as an 18 x 19 mm lesion along the falciform ligament are not characterized on this CT. Further evaluation with MRI without and with contrast on a nonemergent/outpatient basis recommended. There is  no intrahepatic biliary ductal dilatation. The gallbladder is unremarkable. Pancreas: Unremarkable. No pancreatic ductal dilatation or surrounding inflammatory changes. Spleen: Normal in size without focal abnormality. Adrenals/Urinary Tract: The adrenal glands unremarkable. There is no hydronephrosis on either side. There is symmetric enhancement and excretion of  contrast by both kidneys. The visualized ureters and urinary bladder appear unremarkable. Stomach/Bowel: Feeding tube with tip in the distal stomach. There is no bowel obstruction or active inflammation. The appendix is normal. Vascular/Lymphatic: Advanced atherosclerotic calcification of the distal aorta and iliac arteries. The IVC is unremarkable. No portal venous gas. There is no adenopathy. Reproductive: The prostate and seminal vesicles are grossly unremarkable. No pelvic mass. Other: Small fat containing right inguinal hernia. Musculoskeletal: No acute or significant osseous findings. IMPRESSION: 1. Bilateral lower lobe predominant pulmonary opacities most consistent with multifocal pneumonia, possibly atypical in etiology. Clinical correlation is recommended. 2. No acute intra-abdominal or pelvic pathology. No bowel obstruction. Normal appendix. 3. Indeterminate hepatic lesions. Further characterization with MRI without and with contrast recommended. 4. Aortic Atherosclerosis (ICD10-I70.0). Electronically Signed   By: Elgie Collard M.D.   On: 05/11/2020 19:05   CT ABDOMEN PELVIS W CONTRAST  Result Date: 05/11/2020 CLINICAL DATA:  64 year old male with abdominal pain. Concern for pneumonia effusion, or abscess. EXAM: CT CHEST, ABDOMEN, AND PELVIS WITH CONTRAST TECHNIQUE: Multidetector CT imaging of the chest, abdomen and pelvis was performed following the standard protocol during bolus administration of intravenous contrast. CONTRAST:  OMNIPAQUE IOHEXOL 300 MG/ML  SOLN COMPARISON:  Chest radiograph dated 05/11/2020. FINDINGS: CT CHEST FINDINGS Cardiovascular: There is no cardiomegaly or pericardial effusion. Coronary vascular calcification with predominant involvement of the LAD and right coronary artery. There is mild atherosclerotic calcification of the thoracic aorta. No aneurysmal dilatation or dissection. The origins of the great vessels of the aortic arch appear patent. Evaluation of the  pulmonary arteries is limited due to respiratory motion artifact and suboptimal opacification. No large central pulmonary artery embolus identified. Mediastinum/Nodes: No hilar or mediastinal adenopathy. An enteric tube is noted extending to the distal stomach. The thyroid gland is grossly unremarkable. No mediastinal fluid collection. Lungs/Pleura: Bilateral lower lobe predominant pulmonary opacities most consistent with multifocal pneumonia, possibly atypical in etiology. Clinical correlation is recommended. There is no pleural effusion pneumothorax. The central airways are patent. Musculoskeletal: No chest wall mass or suspicious bone lesions identified. CT ABDOMEN PELVIS FINDINGS No intra-abdominal free air or free fluid. Hepatobiliary: Probable background of mild fatty liver. Ill-defined hypodense lesion in the anterior liver measuring 13 x 20 mm as well as an 18 x 19 mm lesion along the falciform ligament are not characterized on this CT. Further evaluation with MRI without and with contrast on a nonemergent/outpatient basis recommended. There is no intrahepatic biliary ductal dilatation. The gallbladder is unremarkable. Pancreas: Unremarkable. No pancreatic ductal dilatation or surrounding inflammatory changes. Spleen: Normal in size without focal abnormality. Adrenals/Urinary Tract: The adrenal glands unremarkable. There is no hydronephrosis on either side. There is symmetric enhancement and excretion of contrast by both kidneys. The visualized ureters and urinary bladder appear unremarkable. Stomach/Bowel: Feeding tube with tip in the distal stomach. There is no bowel obstruction or active inflammation. The appendix is normal. Vascular/Lymphatic: Advanced atherosclerotic calcification of the distal aorta and iliac arteries. The IVC is unremarkable. No portal venous gas. There is no adenopathy. Reproductive: The prostate and seminal vesicles are grossly unremarkable. No pelvic mass. Other: Small fat  containing right inguinal hernia. Musculoskeletal: No acute or significant osseous findings. IMPRESSION: 1. Bilateral  lower lobe predominant pulmonary opacities most consistent with multifocal pneumonia, possibly atypical in etiology. Clinical correlation is recommended. 2. No acute intra-abdominal or pelvic pathology. No bowel obstruction. Normal appendix. 3. Indeterminate hepatic lesions. Further characterization with MRI without and with contrast recommended. 4. Aortic Atherosclerosis (ICD10-I70.0). Electronically Signed   By: Elgie Collard M.D.   On: 05/11/2020 19:05   DG CHEST PORT 1 VIEW  Result Date: 05/12/2020 CLINICAL DATA:  Shortness of breath. EXAM: PORTABLE CHEST 1 VIEW COMPARISON:  May 11, 2020. FINDINGS: The heart size and mediastinal contours are within normal limits. Feeding tube is seen entering stomach. No pneumothorax is noted. Mild bibasilar subsegmental atelectasis or infiltrates are noted. The visualized skeletal structures are unremarkable. IMPRESSION: Mild bibasilar subsegmental atelectasis or infiltrates are noted. Electronically Signed   By: Lupita Raider M.D.   On: 05/12/2020 08:37    PHYSICAL EXAM     Temp:  [98.1 F (36.7 C)-103.2 F (39.6 C)] 100.9 F (38.3 C) (10/13 1045) Pulse Rate:  [102-134] 113 (10/13 1045) Resp:  [16-21] 20 (10/13 1045) BP: (98-158)/(54-75) 130/65 (10/13 1045) SpO2:  [97 %-100 %] 99 % (10/13 1045) Weight:  [62.5 kg] 62.5 kg (10/13 0434)  General -frail middle-aged African-American male, in no apparent distress.  Ophthalmologic - fundi not visualized due to noncooperation.  Cardiovascular - Regular rhythm and rate.  Neuro - drowsy sleepy but open eyes on voice. Global aphasia, able to make a few sounds but not words. Only followed commands of "eye opening" and "eye closing", no other commands. Not naming or repeating. Left gaze preference, abut able to cross midline. Right HH, not blinking to visual threat. PERRL, right upper and  lower facial weakness. Tongue protrusion not cooperative. LUE at least 4/5 and LLE at least 3/5. However, RUE 0/5 mild withdraw to pain. RLE no spontaneous movement but withdraw to pain 3-/5. DTR 1+ and right positive babinski. Sensation, coordination not cooperative and gait not tested.   ASSESSMENT/PLAN [name] is a 64 y.o. male with history of alcohol and tobacco abuse presenting with RUE and RLE weakness, R sensory loss and mixed aphasia. SBP > 200. CT w/ L thalamocapsular IPH w/ IVH. Neuro worsening in ED w/ IVH and hydrocephalus. NS consulted for EVD consideration, which was deferred.   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 Small vessel disease. Atrophy.   CT head 10/6 2042 enlarging L thalamocapsular IPH w/ increasing IVH from L ventricle into 3rd and 4th ventricles.  CT head 10/7 0510 stable L thalamic IPH w/ IVH w/ clearing of blood in 4th ventricle.   CTA head and neck 10/8 unremarkable angio, slight increase in midline shift and ventriculomegaly  MRI not able to perform due to no family contact to clear for metal  Repeat CT head 10/9 suboptimal but stable  2D Echo EF 60-65%  LDL 80  HgbA1c 4.6  UDS positive for cocaine  VTE prophylaxis - SCDs   No antithrombotic prior to admission, now on No antithrombotic given IPH  Therapy recommendations:  pending - ok OOB  Disposition:  pending   Keep in ICU today, consider xfer tomorrow  Cerebral Edema  CT head 10/6 2042 enlarging L thalamocapsular IPH w/ increasing IVH from L ventricle into 3rd and 4th ventricles.  Neurosurgery consulted Yetta Barre) no indication for EVD. Following.  CT head 10/7  0510 stable L thalamic IPH w/ IVH w/ clearing of blood in 4th ventricle.    CTA head and neck 10/8 unremarkable angio, slight increase in midline shift and ventriculomegaly  CT head repeat  10/9 stable  Hypertensive Emergency  Per EMS, SBP > 200   Home meds:  none  Put on Cleviprex for BP control -> now off  Hydralazine IV PRN  On norvasc 10 and lisinopril 20 bid  SBP goal < 160 . Long-term BP goal normotensive  Leukocytosis and fever  WBC 8.8->15.9->16.2-26.7  Tmax afebrile -> 101.3  UA neg  CXR mild cardiomegaly w/ mild central congestion, suspicion mild interstitial edema   Blood culture pending  Hyperlipidemia  Home meds:  None  LDL 80, goal < 70  No statin in setting of IPH  Mild transaminitis, likely related to alcohol, AST/ALP 68/64->52/47  Consider statin low dose on discharge with normalized liver enzymes  Dysphagia . Secondary to stroke . NPO . On IVF @ 30 . cortrak placed . On TF @ 65 . Speech on board   Tobacco abuse  Current smoker  Smoking cessation counseling will be provided  Cocaine abuse  UDS positive for cocaine  Cocaine cessation will be provided  Avoid beta-blocker  Other Stroke Risk Factors  ETOH use, alcohol level <10, advised to drink no more than 2 drink(s) a day  Other Active Problems Fever and leukocytes likely from Aspiration Pneumonia Unasyn started 05/09/2020   Hospital day # 7 Continue Rocephin and azithromycinor aspiration pneumonia. Continue ongoing therapies. Transfuse 2 units red blood cells today. Transfer to rehabilitation over the next few days when medically stable.  Discussed with Dr. Jacqulyn BathPahwani  Greater than 50% time during this 25-minute visit was spent on counseling and coordination of care about his intracerebral hemorrhage and discussion with care team.   Delia HeadyPramod Airanna Partin, MD   To contact Stroke Continuity provider, please refer to [email or web address]. After hours, contact General Neurology

## 2020-05-12 NOTE — Consult Note (Signed)
[facility]    Date of Admission:  05/05/2020      Total days of antibiotics 4  Ceftriaxone / azithromycin day 1 (unasyn previously)               Reason for Consult: Fever & leukocytosis     Referring Provider: [facility]  Primary Care Provider: Patient, No Pcp Per   Assessment: Alan Everett is a 64 y.o. male admitted with acute CVA, hypertensive crisis in the setting of cocaine use. This has since evolved to include ICH/IVH. He has continued to have increasing leukocytosis and fevers despite treatment for possible UTI or possible aspiration pneumonia. CT scan reveals no bacterial process aside from opacities on chest imaging. While the cause of his fevers are unclear, I suspect that his leukocytosis and fevers may be related to ICH/IVH. Will continue current antimicrobials and plan to check contrasted CT scan to try to rule out any concern for infected hematoma in the brain.  If this looks concerning will need to change to CNS penetrating therapy (vancomycin + BID Ceftriaxone).  Follow blood cultures to maturity Check hep c Ab (unidentified liver lesions and substance use history).      Plan: 1. Continue ceftriaxone 2. Check contrasted Head CT  3. Follow fever curve     Active Problems:   ICH (intracerebral hemorrhage) (HCC)   SIRS (systemic inflammatory response syndrome) (HCC)   Polysubstance abuse (HCC)   Dysphagia, post-stroke   Hyperglycemia   Hypokalemia   Hypomagnesemia   . amLODipine  10 mg Per Tube Daily  . chlorhexidine  15 mL Mouth Rinse BID  . Chlorhexidine Gluconate Cloth  6 each Topical Daily  . insulin aspart  0-9 Units Subcutaneous Q4H  . insulin glargine  10 Units Subcutaneous Daily  . ipratropium  0.5 mg Nebulization BID  . levalbuterol  0.63 mg Nebulization BID  . lisinopril  20 mg Per Tube BID  . mouth rinse  15 mL Mouth Rinse q12n4p  . pantoprazole sodium  40 mg Per Tube BID  . senna-docusate  1 tablet Per Tube  BID  . sodium chloride flush  10-40 mL Intracatheter Q12H    HPI: Alan Everett is a 64 y.o. male admitted to the hospital via EMS with acute onset right sided weakness.   He is unable to provide a history for me due to aphasia and lethargy.   Chart review reveals he presented with mixed receptive/expressive dysphasia. CT of the head revealed intraparenchymal hemorrhage centered at the left thalamic capsular region with minimal surrounding vasogenic edema. Progressed to evolved ICH and IVH.  Over the course of the hospitalization he has developed leukocytosis and fevers. He is on treatment with unasyn IV with transition to IV ceftriaxone/azithromycin for possible aspiration PNA +/- UTI (grew 100K klebsiella from urine).  CT C/A/P - bilateral pulmonary opacities c/w multifocal pneumonia. Indeterminate liver lesions that will need MRI to further characterize.      Review of Systems: Review of Systems  Unable to perform ROS: Patient nonverbal    Past Medical History:  Diagnosis Date  . Alcohol use   . Tobacco abuse     Social History   Tobacco Use  . Smoking status: Current Every Day Smoker  . Smokeless tobacco: Never Used  Substance Use Topics  . Alcohol use: Yes    Alcohol/week: 6.0 standard drinks    Types: 6 Cans of beer per week  . Drug use:  Never    Family History  Problem Relation Age of Onset  . Cancer Mother        his mother died of cancer, but patient is not sure which type of cancner.   . Diabetes Mellitus II Niece    No Known Allergies  OBJECTIVE: Blood pressure (!) 147/61, pulse (!) 106, temperature 100.1 F (37.8 C), temperature source Oral, resp. rate 20, weight 62.5 kg, SpO2 99 %.  Physical Exam Vitals reviewed.  Constitutional:      Comments: Lethargic, difficult to arouse. Opens eyes intermittently.   Eyes:     General: No scleral icterus.    Conjunctiva/sclera: Conjunctivae normal.     Comments: Left sided gaze  Cardiovascular:     Rate and  Rhythm: Normal rate and regular rhythm.     Heart sounds: No murmur heard.   Pulmonary:     Effort: Pulmonary effort is normal.     Breath sounds: Rhonchi (upper airway ) present.  Abdominal:     General: Bowel sounds are normal. There is no distension.  Musculoskeletal:     Comments: Does not move right side on exam for me.   Skin:    General: Skin is warm and dry.     Lab Results Lab Results  Component Value Date   WBC 31.9 (H) 05/12/2020   HGB 6.8 (LL) 05/12/2020   HCT 22.1 (L) 05/12/2020   MCV 93.6 05/12/2020   PLT 189 05/12/2020    Lab Results  Component Value Date   CREATININE 1.08 05/12/2020   BUN 25 (H) 05/12/2020   NA 145 05/12/2020   K 3.8 05/12/2020   CL 115 (H) 05/12/2020   CO2 22 05/12/2020    Lab Results  Component Value Date   ALT 86 (H) 05/12/2020   AST 118 (H) 05/12/2020   ALKPHOS 58 05/12/2020   BILITOT 0.7 05/12/2020     Microbiology: Recent Results (from the past 240 hour(s))  Respiratory Panel by RT PCR (Flu A&B, Covid) - Nasopharyngeal Swab     Status: None   Collection Time: 05/05/20  6:46 PM   Specimen: Nasopharyngeal Swab  Result Value Ref Range Status   SARS Coronavirus 2 by RT PCR NEGATIVE NEGATIVE Final    Comment: (NOTE) SARS-CoV-2 target nucleic acids are NOT DETECTED.  The SARS-CoV-2 RNA is generally detectable in upper respiratoy specimens during the acute phase of infection. The lowest concentration of SARS-CoV-2 viral copies this assay can detect is 131 copies/mL. A negative result does not preclude SARS-Cov-2 infection and should not be used as the sole basis for treatment or other patient management decisions. A negative result may occur with  improper specimen collection/handling, submission of specimen other than nasopharyngeal swab, presence of viral mutation(s) within the areas targeted by this assay, and inadequate number of viral copies (<131 copies/mL). A negative result must be combined with  clinical observations, patient history, and epidemiological information. The expected result is Negative.  Fact Sheet for Patients:  https://www.moore.com/  Fact Sheet for Healthcare Providers:  https://www.young.biz/  This test is no t yet approved or cleared by the [place] FDA and  has been authorized for detection and/or diagnosis of SARS-CoV-2 by FDA under an Emergency Use Authorization (EUA). This EUA will remain  in effect (meaning this test can be used) for the duration of the COVID-19 declaration under Section 564(b)(1) of the Act, 21 U.S.C. section 360bbb-3(b)(1), unless the authorization is terminated or revoked sooner.     Influenza A by  PCR NEGATIVE NEGATIVE Final   Influenza B by PCR NEGATIVE NEGATIVE Final    Comment: (NOTE) The Xpert Xpress SARS-CoV-2/FLU/RSV assay is intended as an aid in  the diagnosis of influenza from Nasopharyngeal swab specimens and  should not be used as a sole basis for treatment. Nasal washings and  aspirates are unacceptable for Xpert Xpress SARS-CoV-2/FLU/RSV  testing.  Fact Sheet for Patients: https://www.moore.com/https://www.fda.gov/media/142436/download  Fact Sheet for Healthcare Providers: https://www.young.biz/https://www.fda.gov/media/142435/download  This test is not yet approved or cleared by the [place] FDA and  has been authorized for detection and/or diagnosis of SARS-CoV-2 by  FDA under an Emergency Use Authorization (EUA). This EUA will remain  in effect (meaning this test can be used) for the duration of the  Covid-19 declaration under Section 564(b)(1) of the Act, 21  U.S.C. section 360bbb-3(b)(1), unless the authorization is  terminated or revoked. Performed at [facility], [address] [place], [place] [address]   MRSA PCR Screening     Status: None   Collection Time: 05/05/20 11:56 PM   Specimen: Nasal Mucosa; Nasopharyngeal  Result Value Ref Range Status   MRSA by PCR NEGATIVE NEGATIVE Final     Comment:        The GeneXpert MRSA Assay (FDA approved for NASAL specimens only), is one component of a comprehensive MRSA colonization surveillance program. It is not intended to diagnose MRSA infection nor to guide or monitor treatment for MRSA infections. Performed at [facility], [address] [place], [place] [address]   Culture, blood (Routine X 2) w Reflex to ID Panel     Status: None   Collection Time: 05/07/20  7:59 PM   Specimen: BLOOD LEFT HAND  Result Value Ref Range Status   Specimen Description BLOOD LEFT HAND  Final   Special Requests   Final    BOTTLES DRAWN AEROBIC AND ANAEROBIC Blood Culture adequate volume   Culture   Final    NO GROWTH 5 DAYS Performed at [facility], [address] [place], [place] [address]    Report Status 05/12/2020 FINAL  Final  Culture, blood (Routine X 2) w Reflex to ID Panel     Status: None   Collection Time: 05/07/20  8:06 PM   Specimen: BLOOD RIGHT HAND  Result Value Ref Range Status   Specimen Description BLOOD RIGHT HAND  Final   Special Requests   Final    BOTTLES DRAWN AEROBIC AND ANAEROBIC Blood Culture adequate volume   Culture   Final    NO GROWTH 5 DAYS Performed at [facility], [address] [place], [place] [address]    Report Status 05/12/2020 FINAL  Final  Culture, blood (routine x 2)     Status: None (Preliminary result)   Collection Time: 05/09/20  8:27 AM   Specimen: BLOOD  Result Value Ref Range Status   Specimen Description BLOOD RIGHT ANTECUBITAL  Final   Special Requests   Final    AEROBIC BOTTLE ONLY Blood Culture results may not be optimal due to an inadequate volume of blood received in culture bottles   Culture   Final    NO GROWTH 3 DAYS Performed at [facility], [address] [place], [place] [address]    Report Status PENDING  Incomplete  Culture, blood (routine x 2)     Status: None (Preliminary result)   Collection Time: 05/09/20  8:27 AM   Specimen:  BLOOD  Result Value  Ref Range Status   Specimen Description BLOOD RIGHT ANTECUBITAL  Final   Special Requests   Final    AEROBIC BOTTLE ONLY Blood Culture results may not be optimal due to an inadequate volume of blood received in culture bottles   Culture   Final    NO GROWTH 3 DAYS Performed at [facility], [address], [place], [place] [address]    Report Status PENDING  Incomplete  Culture, Urine     Status: Abnormal   Collection Time: 05/09/20  8:40 AM   Specimen: Urine, Random  Result Value Ref Range Status   Specimen Description URINE, RANDOM  Final   Special Requests   Final    NONE Performed at [facility], [address] [place], [place] [address]    Culture >=100,000 COLONIES/mL KLEBSIELLA PNEUMONIAE (A)  Final   Report Status 05/11/2020 FINAL  Final   Organism ID, Bacteria KLEBSIELLA PNEUMONIAE (A)  Final      Susceptibility   Klebsiella pneumoniae - MIC*    AMPICILLIN RESISTANT Resistant     CEFAZOLIN <=4 SENSITIVE Sensitive     CEFTRIAXONE <=0.25 SENSITIVE Sensitive     CIPROFLOXACIN <=0.25 SENSITIVE Sensitive     GENTAMICIN <=1 SENSITIVE Sensitive     IMIPENEM <=0.25 SENSITIVE Sensitive     NITROFURANTOIN 32 SENSITIVE Sensitive     TRIMETH/SULFA <=20 SENSITIVE Sensitive     AMPICILLIN/SULBACTAM 4 SENSITIVE Sensitive     PIP/TAZO <=4 SENSITIVE Sensitive     * >=100,000 COLONIES/mL KLEBSIELLA PNEUMONIAE     Rexene Alberts, MSN, NP-C [facility]  [email or web address] Pager: [phone] Office: [phone] [facility] Main Line: [phone]

## 2020-05-12 NOTE — Progress Notes (Signed)
Inpatient Rehab Admissions Coordinator:   Pt. Remains confused. During most recent therapy session, Pt. Required total assist for bed mobility and transfers and OT updated d/c recs for SNF. I agree that SNF is a more appropriate disposition vs. CIR at this time. CIR will sign off at this time, but if Pt.'s mentation and participation iimproves, MD may re-enter consult order.   Megan Salon, MS, CCC-SLP Rehab Admissions Coordinator  [phone] (celll) [phone] (office)

## 2020-05-13 DIAGNOSIS — D72829 Elevated white blood cell count, unspecified: Secondary | ICD-10-CM

## 2020-05-13 DIAGNOSIS — J690 Pneumonitis due to inhalation of food and vomit: Secondary | ICD-10-CM

## 2020-05-13 LAB — CBC WITH DIFFERENTIAL/PLATELET
Abs Immature Granulocytes: 2.7 10*3/uL — ABNORMAL HIGH (ref 0.00–0.07)
Basophils Absolute: 0.2 10*3/uL — ABNORMAL HIGH (ref 0.0–0.1)
Basophils Relative: 0 %
Eosinophils Absolute: 0.3 10*3/uL (ref 0.0–0.5)
Eosinophils Relative: 1 %
HCT: 27.5 % — ABNORMAL LOW (ref 39.0–52.0)
Hemoglobin: 8.8 g/dL — ABNORMAL LOW (ref 13.0–17.0)
Immature Granulocytes: 8 %
Lymphocytes Relative: 6 %
Lymphs Abs: 2.1 10*3/uL (ref 0.7–4.0)
MCH: 28.7 pg (ref 26.0–34.0)
MCHC: 32 g/dL (ref 30.0–36.0)
MCV: 89.6 fL (ref 80.0–100.0)
Monocytes Absolute: 3.5 10*3/uL — ABNORMAL HIGH (ref 0.1–1.0)
Monocytes Relative: 10 %
Neutro Abs: 24.8 10*3/uL — ABNORMAL HIGH (ref 1.7–7.7)
Neutrophils Relative %: 75 %
Platelets: 232 10*3/uL (ref 150–400)
RBC: 3.07 MIL/uL — ABNORMAL LOW (ref 4.22–5.81)
RDW: 20.1 % — ABNORMAL HIGH (ref 11.5–15.5)
WBC: 33.5 10*3/uL — ABNORMAL HIGH (ref 4.0–10.5)
nRBC: 0.4 % — ABNORMAL HIGH (ref 0.0–0.2)

## 2020-05-13 LAB — TYPE AND SCREEN
ABO/RH(D): O POS
Antibody Screen: NEGATIVE
Unit division: 0
Unit division: 0

## 2020-05-13 LAB — GLUCOSE, CAPILLARY
Glucose-Capillary: 165 mg/dL — ABNORMAL HIGH (ref 70–99)
Glucose-Capillary: 136 mg/dL — ABNORMAL HIGH (ref 70–99)
Glucose-Capillary: 146 mg/dL — ABNORMAL HIGH (ref 70–99)
Glucose-Capillary: 127 mg/dL — ABNORMAL HIGH (ref 70–99)
Glucose-Capillary: 152 mg/dL — ABNORMAL HIGH (ref 70–99)
Glucose-Capillary: 156 mg/dL — ABNORMAL HIGH (ref 70–99)

## 2020-05-13 LAB — MAGNESIUM: Magnesium: 2.4 mg/dL (ref 1.7–2.4)

## 2020-05-13 LAB — BASIC METABOLIC PANEL
Anion gap: 12 (ref 5–15)
BUN: 26 mg/dL — ABNORMAL HIGH (ref 8–23)
CO2: 19 mmol/L — ABNORMAL LOW (ref 22–32)
Calcium: 8.7 mg/dL — ABNORMAL LOW (ref 8.9–10.3)
Chloride: 118 mmol/L — ABNORMAL HIGH (ref 98–111)
Creatinine, Ser: 1.08 mg/dL (ref 0.61–1.24)
GFR, Estimated: >60 mL/min (ref >60)
Glucose, Bld: 154 mg/dL — ABNORMAL HIGH (ref 70–99)
Potassium: 4 mmol/L (ref 3.5–5.1)
Sodium: 149 mmol/L — ABNORMAL HIGH (ref 135–145)

## 2020-05-13 LAB — BPAM RBC
Blood Product Expiration Date: 202111052359
Blood Product Expiration Date: 202111052359
ISSUE DATE / TIME: 202110131024
ISSUE DATE / TIME: 202110131333
Unit Type and Rh: 5100
Unit Type and Rh: 5100

## 2020-05-13 LAB — PATHOLOGIST SMEAR REVIEW

## 2020-05-13 MED ORDER — METRONIDAZOLE 500 MG PO TABS
500.0000 mg | ORAL_TABLET | Freq: Three times a day (TID) | ORAL | Status: AC
  Administered 2020-05-13 – 2020-05-15 (×7): 500 mg
  Filled 2020-05-13 (×8): qty 1

## 2020-05-13 MED ORDER — METRONIDAZOLE 500 MG PO TABS
500.0000 mg | ORAL_TABLET | Freq: Three times a day (TID) | ORAL | Status: DC
  Administered 2020-05-13: 500 mg via ORAL
  Filled 2020-05-13: qty 1

## 2020-05-13 MED ORDER — METOPROLOL TARTRATE 25 MG PO TABS
25.0000 mg | ORAL_TABLET | Freq: Two times a day (BID) | ORAL | Status: DC
  Administered 2020-05-13: 25 mg via ORAL
  Filled 2020-05-13: qty 1

## 2020-05-13 MED ORDER — METOPROLOL TARTRATE 25 MG PO TABS
25.0000 mg | ORAL_TABLET | Freq: Two times a day (BID) | ORAL | Status: DC
  Administered 2020-05-13 – 2020-05-25 (×22): 25 mg
  Filled 2020-05-13 (×24): qty 1

## 2020-05-13 MED ORDER — SODIUM CHLORIDE 0.9 % IV SOLN
2.0000 g | Freq: Every day | INTRAVENOUS | Status: AC
  Administered 2020-05-14 – 2020-05-15 (×2): 2 g via INTRAVENOUS
  Filled 2020-05-13: qty 20
  Filled 2020-05-13: qty 2

## 2020-05-13 NOTE — Progress Notes (Signed)
Patient tachycardic 115 on routine vitals. Yellow mews. MD made aware. Patient started on metoprolol and cardiac monitoring. I will continue to monitor HR.

## 2020-05-13 NOTE — Progress Notes (Signed)
STROKE TEAM PROGRESS NOTE   INTERVAL HISTORY Patient looks a little better today.  His fever is now down to 99.7.  He is more alert and awake but remains aphasic with dense right hemiplegia.  Vital signs stable.  Vitals:   05/12/20 2300 05/13/20 0352 05/13/20 0816 05/13/20 0822  BP: (!) 159/62 (!) 132/58 (!) 139/58 (!) 139/58  Pulse: (!) 101 (!) 108 (!) 114 (!) 114  Resp: 18 19 18  (!) 24  Temp: 99.6 F (37.6 C) 99.7 F (37.6 C)  99.7 F (37.6 C)  TempSrc: Oral Oral  Axillary  SpO2: 100% 98% 97% 95%  Weight:       CBC:  Recent Labs  Lab 05/11/20 0637 05/11/20 0637 05/12/20 0510 05/12/20 1911  WBC 29.3*  --  31.9*  --   NEUTROABS 21.9*  --  25.3*  --   HGB 8.7*   < > 6.8* 10.4*  HCT 28.4*   < > 22.1* 32.5*  MCV 93.7  --  93.6  --   PLT 177  --  189  --    < > = values in this interval not displayed.   Basic Metabolic Panel:  Recent Labs  Lab 05/11/20 0637 05/12/20 0510  NA 146* 145  K 4.1 3.8  CL 116* 115*  CO2 19* 22  GLUCOSE 157* 205*  BUN 21 25*  CREATININE 0.91 1.08  CALCIUM 8.9 8.5*  MG 2.1 2.3  PHOS 3.0 3.1   Lipid Panel:  No results for input(s): CHOL, TRIG, HDL, CHOLHDL, VLDL, LDLCALC in the last 168 hours. HgbA1c:  No results for input(s): HGBA1C in the last 168 hours. Urine Drug Screen:  No results for input(s): LABOPIA, COCAINSCRNUR, LABBENZ, AMPHETMU, THCU, LABBARB in the last 168 hours.  Alcohol Level  No results for input(s): ETH in the last 168 hours.  IMAGING past 24 hours CT HEAD W & WO CONTRAST  Result Date: 05/12/2020 CLINICAL DATA:  64 year old male with intracranial hemorrhage. Subsequent encounter. EXAM: CT HEAD WITHOUT AND WITH CONTRAST TECHNIQUE: Contiguous axial images were obtained from the base of the skull through the vertex without and with intravenous contrast CONTRAST:  50mL OMNIPAQUE IOHEXOL 300 MG/ML  SOLN COMPARISON:  Head CTs 05/08/2020 and earlier. FINDINGS: Brain: Irregular hyperdense hemorrhage centered at the posterior  left thalamus and lentiform with intraventricular extension again noted. Intra-axial hemorrhage component now estimated at 32 mL - 44 x 33 x 44 mm (AP by transverse by CC). This is stable when measured in the same way as on 05/08/2020. The volume of intraventricular blood is stable, moderate but primarily limited to the left lateral ventricle as before. Small volume layering in the right occipital horn. Trace subarachnoid hemorrhage over the posterior convexities is stable. Regional edema and mild regional mass effect is stable. There is stable mild rightward midline shift. Basilar cisterns remain normal. Mild lateral ventriculomegaly is stable. Stable gray-white matter differentiation elsewhere. No new intracranial abnormality. Vascular: Calcified atherosclerosis at the skull base. No suspicious intracranial vascular hyperdensity. Skull: No acute osseous abnormality identified. Sinuses/Orbits: Left nasoenteric tube in place. Visualized paranasal sinuses and mastoids are stable and well pneumatized. Other: Retained secretions in the nasopharynx. No acute orbit or scalp soft tissue finding. IMPRESSION: 1. 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 Regional edema, mild regional mass effect, mild lateral ventriculomegaly, and trace subarachnoid hemorrhage. 2. No new intracranial abnormality. Electronically Signed   By: 07/08/2020 M.D.   On: 05/12/2020 16:33    PHYSICAL EXAM  Temp:  [98 F (36.7 C)-100.9 F (38.3 C)] 99.7 F (37.6 C) (10/14 0822) Pulse Rate:  [101-117] 114 (10/14 0822) Resp:  [18-24] 24 (10/14 0822) BP: (130-159)/(58-74) 139/58 (10/14 0822) SpO2:  [93 %-100 %] 95 % (10/14 0822)  General -frail middle-aged African-American male, in no apparent distress.  Ophthalmologic - fundi not visualized due to noncooperation.  Cardiovascular - Regular rhythm and rate.  Neuro -awake and alert global aphasia, able to make a few sounds but not words. Only  followed commands of "eye opening" and "eye closing", no other commands. Not naming or repeating. Left gaze preference, abut able to cross midline. Right HH, not blinking to visual threat. PERRL, right upper and lower facial weakness. Tongue protrusion not cooperative. LUE at least 4/5 and LLE at least 3/5. However, RUE 0/5 mild withdraw to pain. RLE no spontaneous movement but withdraw to pain 3-/5. DTR 1+ and right positive babinski. Sensation, coordination not cooperative and gait not tested.   ASSESSMENT/PLAN Mr. Schon Zeiders is a 64 y.o. male with history of alcohol and tobacco abuse presenting with RUE and RLE weakness, R sensory loss and mixed aphasia. SBP > 200. CT w/ L thalamocapsular IPH w/ IVH. Neuro worsening in ED w/ IVH and hydrocephalus. NS consulted for EVD consideration, which was deferred.   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 Small vessel disease. Atrophy.   CT head 10/6 2042 enlarging L thalamocapsular IPH w/ increasing IVH from L ventricle into 3rd and 4th ventricles.  CT head 10/7 0510 stable L thalamic IPH w/ IVH w/ clearing of blood in 4th ventricle.   CTA head and neck 10/8 unremarkable angio, slight increase in midline shift and ventriculomegaly  MRI not able to perform due to no family contact to clear for metal  Repeat CT head 10/9 suboptimal but stable  2D Echo EF 60-65%  LDL 80  HgbA1c 4.6  UDS positive for cocaine  VTE prophylaxis - SCDs   No antithrombotic prior to admission, now on No antithrombotic given IPH  Therapy recommendations:  pending - ok OOB  Disposition:  pending   Keep in ICU today, consider xfer tomorrow  Cerebral Edema  CT head 10/6 2042 enlarging L thalamocapsular IPH w/ increasing IVH from L ventricle into 3rd and 4th ventricles.  Neurosurgery consulted Yetta Barre) no indication for EVD.  Following.  CT head 10/7 0510 stable L thalamic IPH w/ IVH w/ clearing of blood in 4th ventricle.    CTA head and neck 10/8 unremarkable angio, slight increase in midline shift and ventriculomegaly  CT head repeat 10/9 stable  Hypertensive Emergency  Per EMS, SBP > 200   Home meds:  none  Put on Cleviprex for BP control -> now off  Hydralazine IV PRN  On norvasc 10 and lisinopril 20 bid  SBP goal < 160 . Long-term BP goal normotensive  Leukocytosis and fever  WBC 8.8->15.9->16.2-26.7  Tmax afebrile -> 101.3  UA neg  CXR mild cardiomegaly w/ mild central congestion, suspicion mild interstitial edema   Blood culture pending  Hyperlipidemia  Home meds:  None  LDL 80, goal < 70  No statin in setting of IPH  Mild transaminitis, likely related to alcohol, AST/ALP 68/64->52/47  Consider statin low dose on discharge with normalized liver enzymes  Dysphagia . Secondary to stroke . NPO . On IVF @  30 . cortrak placed . On TF @ 65 . Speech on board   Tobacco abuse  Current smoker  Smoking cessation counseling will be provided  Cocaine abuse  UDS positive for cocaine  Cocaine cessation will be provided  Avoid beta-blocker  Other Stroke Risk Factors  ETOH use, alcohol level <10, advised to drink no more than 2 drink(s) a day  Other Active Problems Fever and leukocytes likely from Aspiration Pneumonia Unasyn started 05/09/2020   Hospital day # 8  Patient seems more alert and fever curve seems to have come down but white count remains high at 33.5.  Continue antibiotics.  Await transfer to rehab over the next few days when medically stable.  Discussed with Dr. Jacqulyn Bath  and Dr. Daiva Eves   from infectious disease.  Greater than 50% time during this 25-minute visit was spent on counseling and coordination of care and discussion with care team. Delia Heady, MD   To contact Stroke Continuity provider, please refer to [email or web address]. After hours, contact  General Neurology

## 2020-05-13 NOTE — Progress Notes (Signed)
[facility]  Date of Admission:  05/05/2020      Total days of antibiotics 5  Day 2 ceftriaxone   Day 1 metronidazole            ASSESSMENT: Alan Everett is a 64 y.o. 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 His fevers have seemed to settle out today so far. CBC ordered and pending. Unclear as to why he has a leukemoid reaction but suspect inflammation r/t bleed. CT of his head w/o concern for changes or new bacterial process. Highest suspicion if a bacterial process it may be aspiration pneumonia/pneumonitis. Will continue Ceftriaxone and add metronidazole. Would continue through total of 7 day then monitor off antibiotics.    MRSA nasal PCR negative on 10/6 - no need for empiric vancomycin for consideration of PNA tx.    PLAN: 1. Continue ceftriaxone  2. Add metronidazole  3. Treat 2 more days for aspiration pna then follow off antibiotics  4. Trend fever/wbc curve.  5. Follow Hep C Ab    Active Problems:   ICH (intracerebral hemorrhage) (HCC)   SIRS (systemic inflammatory response syndrome) (HCC)   Polysubstance abuse (HCC)   Dysphagia, post-stroke   Hyperglycemia   Hypokalemia   Hypomagnesemia   Fever   SOB (shortness of breath)    amLODipine  10 mg Per Tube Daily   chlorhexidine  15 mL Mouth Rinse BID   Chlorhexidine Gluconate Cloth  6 each Topical Daily   insulin aspart  0-9 Units Subcutaneous Q4H   insulin glargine  10 Units Subcutaneous Daily   ipratropium  0.5 mg Nebulization BID   levalbuterol  0.63 mg Nebulization BID   lisinopril  20 mg Per Tube BID   mouth rinse  15 mL Mouth Rinse q12n4p   metoprolol tartrate  25 mg Oral BID   metroNIDAZOLE  500 mg Oral Q8H   pantoprazole sodium  40 mg Per Tube BID   senna-docusate  1 tablet Per Tube BID   sodium chloride flush  10-40 mL Intracatheter Q12H    SUBJECTIVE: Non verbal.    Review of  Systems: Review of Systems  Unable to perform ROS: Patient nonverbal    No Known Allergies  OBJECTIVE: Vitals:   05/12/20 2300 05/13/20 0352 05/13/20 0816 05/13/20 0822  BP: (!) 159/62 (!) 132/58 (!) 139/58 (!) 139/58  Pulse: (!) 101 (!) 108 (!) 114 (!) 114  Resp: 18 19 18  (!) 24  Temp: 99.6 F (37.6 C) 99.7 F (37.6 C)  99.7 F (37.6 C)  TempSrc: Oral Oral  Axillary  SpO2: 100% 98% 97% 95%  Weight:       Body mass index is 22.24 kg/m.  Physical Exam Constitutional:      Appearance: He is ill-appearing.     Comments: Resting in bed on left side. Moving only left side.   HENT:     Mouth/Throat:     Mouth: Mucous membranes are moist.     Pharynx: Oropharynx is clear.  Eyes:     General: No scleral icterus. Cardiovascular:     Rate and Rhythm: Regular rhythm. Tachycardia present.     Heart sounds: No murmur heard.   Pulmonary:     Effort: Pulmonary effort is normal.     Breath sounds: Rhonchi present.  Abdominal:     General: Bowel sounds are normal. There is no distension.  Palpations: Abdomen is soft.     Tenderness: There is no abdominal tenderness.  Skin:    General: Skin is warm and dry.     Capillary Refill: Capillary refill takes less than 2 seconds.  Neurological:     Mental Status: He is alert.     Lab Results Lab Results  Component Value Date   WBC 33.5 (H) 05/13/2020   HGB 8.8 (L) 05/13/2020   HCT 27.5 (L) 05/13/2020   MCV 89.6 05/13/2020   PLT 232 05/13/2020    Lab Results  Component Value Date   CREATININE 1.08 05/12/2020   BUN 25 (H) 05/12/2020   NA 145 05/12/2020   K 3.8 05/12/2020   CL 115 (H) 05/12/2020   CO2 22 05/12/2020    Lab Results  Component Value Date   ALT 86 (H) 05/12/2020   AST 118 (H) 05/12/2020   ALKPHOS 58 05/12/2020   BILITOT 0.7 05/12/2020     Microbiology: Recent Results (from the past 240 hour(s))  Respiratory Panel by RT PCR (Flu A&B, Covid) - Nasopharyngeal Swab     Status: None   Collection Time:  05/05/20  6:46 PM   Specimen: Nasopharyngeal Swab  Result Value Ref Range Status   SARS Coronavirus 2 by RT PCR NEGATIVE NEGATIVE Final    Comment: (NOTE) SARS-CoV-2 target nucleic acids are NOT DETECTED.  The SARS-CoV-2 RNA is generally detectable in upper respiratoy specimens during the acute phase of infection. The lowest concentration of SARS-CoV-2 viral copies this assay can detect is 131 copies/mL. A negative result does not preclude SARS-Cov-2 infection and should not be used as the sole basis for treatment or other patient management decisions. A negative result may occur with  improper specimen collection/handling, submission of specimen other than nasopharyngeal swab, presence of viral mutation(s) within the areas targeted by this assay, and inadequate number of viral copies (<131 copies/mL). A negative result must be combined with clinical observations, patient history, and epidemiological information. The expected result is Negative.  Fact Sheet for Patients:  https://www.moore.com/https://www.fda.gov/media/142436/download  Fact Sheet for Healthcare Providers:  https://www.young.biz/https://www.fda.gov/media/142435/download  This test is no t yet approved or cleared by the [place] FDA and  has been authorized for detection and/or diagnosis of SARS-CoV-2 by FDA under an Emergency Use Authorization (EUA). This EUA will remain  in effect (meaning this test can be used) for the duration of the COVID-19 declaration under Section 564(b)(1) of the Act, 21 U.S.C. section 360bbb-3(b)(1), unless the authorization is terminated or revoked sooner.     Influenza A by PCR NEGATIVE NEGATIVE Final   Influenza B by PCR NEGATIVE NEGATIVE Final    Comment: (NOTE) The Xpert Xpress SARS-CoV-2/FLU/RSV assay is intended as an aid in  the diagnosis of influenza from Nasopharyngeal swab specimens and  should not be used as a sole basis for treatment. Nasal washings and  aspirates are unacceptable for Xpert Xpress  SARS-CoV-2/FLU/RSV  testing.  Fact Sheet for Patients: https://www.moore.com/https://www.fda.gov/media/142436/download  Fact Sheet for Healthcare Providers: https://www.young.biz/https://www.fda.gov/media/142435/download  This test is not yet approved or cleared by the [place] FDA and  has been authorized for detection and/or diagnosis of SARS-CoV-2 by  FDA under an Emergency Use Authorization (EUA). This EUA will remain  in effect (meaning this test can be used) for the duration of the  Covid-19 declaration under Section 564(b)(1) of the Act, 21  U.S.C. section 360bbb-3(b)(1), unless the authorization is  terminated or revoked. Performed at [facility], [address] [place],  [place] [address]   MRSA PCR Screening     Status: None   Collection Time: 05/05/20 11:56 PM   Specimen: Nasal Mucosa; Nasopharyngeal  Result Value Ref Range Status   MRSA by PCR NEGATIVE NEGATIVE Final    Comment:        The GeneXpert MRSA Assay (FDA approved for NASAL specimens only), is one component of a comprehensive MRSA colonization surveillance program. It is not intended to diagnose MRSA infection nor to guide or monitor treatment for MRSA infections. Performed at [facility], [address] [place], [place] [address]   Culture, blood (Routine X 2) w Reflex to ID Panel     Status: None   Collection Time: 05/07/20  7:59 PM   Specimen: BLOOD LEFT HAND  Result Value Ref Range Status   Specimen Description BLOOD LEFT HAND  Final   Special Requests   Final    BOTTLES DRAWN AEROBIC AND ANAEROBIC Blood Culture adequate volume   Culture   Final    NO GROWTH 5 DAYS Performed at [facility], [address], [place], [place] [address]    Report Status 05/12/2020 FINAL  Final  Culture, blood (Routine X 2) w Reflex to ID Panel     Status: None   Collection Time: 05/07/20  8:06 PM   Specimen: BLOOD RIGHT HAND  Result Value Ref Range Status   Specimen Description BLOOD RIGHT HAND  Final   Special Requests    Final    BOTTLES DRAWN AEROBIC AND ANAEROBIC Blood Culture adequate volume   Culture   Final    NO GROWTH 5 DAYS Performed at [facility], [address] [place], [place] [address]    Report Status 05/12/2020 FINAL  Final  Culture, blood (routine x 2)     Status: None (Preliminary result)   Collection Time: 05/09/20  8:27 AM   Specimen: BLOOD  Result Value Ref Range Status   Specimen Description BLOOD RIGHT ANTECUBITAL  Final   Special Requests   Final    AEROBIC BOTTLE ONLY Blood Culture results may not be optimal due to an inadequate volume of blood received in culture bottles   Culture   Final    NO GROWTH 3 DAYS Performed at [facility], [address], [place], [place] [address]    Report Status PENDING  Incomplete  Culture, blood (routine x 2)     Status: None (Preliminary result)   Collection Time: 05/09/20  8:27 AM   Specimen: BLOOD  Result Value Ref Range Status   Specimen Description BLOOD RIGHT ANTECUBITAL  Final   Special Requests   Final    AEROBIC BOTTLE ONLY Blood Culture results may not be optimal due to an inadequate volume of blood received in culture bottles   Culture   Final    NO GROWTH 3 DAYS Performed at [facility], [address] [place], [place] [address]    Report Status PENDING  Incomplete  Culture, Urine     Status: Abnormal   Collection Time: 05/09/20  8:40 AM   Specimen: Urine, Random  Result Value Ref Range Status   Specimen Description URINE, RANDOM  Final   Special Requests   Final    NONE Performed at [facility], [address] [place], [place] [address]    Culture >=100,000 COLONIES/mL KLEBSIELLA PNEUMONIAE (A)  Final   Report Status 05/11/2020 FINAL  Final   Organism ID, Bacteria KLEBSIELLA  PNEUMONIAE (A)  Final      Susceptibility   Klebsiella pneumoniae - MIC*    AMPICILLIN RESISTANT Resistant     CEFAZOLIN <=4 SENSITIVE Sensitive     CEFTRIAXONE <=0.25 SENSITIVE Sensitive     CIPROFLOXACIN <=0.25  SENSITIVE Sensitive     GENTAMICIN <=1 SENSITIVE Sensitive     IMIPENEM <=0.25 SENSITIVE Sensitive     NITROFURANTOIN 32 SENSITIVE Sensitive     TRIMETH/SULFA <=20 SENSITIVE Sensitive     AMPICILLIN/SULBACTAM 4 SENSITIVE Sensitive     PIP/TAZO <=4 SENSITIVE Sensitive     * >=100,000 COLONIES/mL KLEBSIELLA PNEUMONIAE     Rexene Alberts, MSN, NP-C [facility]  [email or web address] Pager: [phone] Office: [phone] [facility] Main Line: [phone]

## 2020-05-13 NOTE — Progress Notes (Signed)
PROGRESS NOTE    Alan Everett  ZDG:[id number] DOB: [birth year] DOA: 05/05/2020 PCP: Patient, No Pcp Per   Brief Narrative:  HPI per [name] on 05/05/20 Alan Everett is an 64 y.o. male with a PMHx of alcohol use and tobacco abuse who presented to the ED via EMS as a Code Stroke for acute onset of right sided weakness. he also had sensory loss and weakness involving his RUE and RLE. 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 Neurosurgery was consulted for EVD consideration which was then deferred.  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  Transferred under [facility] on 05/09/2020.  On 05/08/2020, patient spiked fever and he was started on broad-spectrum antibiotics/IV Zosyn empirically.  Despite of antibiotics, patient's white blood cells as well as fever continue to get worse with a T-max of 103.2 at around noon on 05/11/2020.  Previous hospitalist [name] had discussed case with ID who recommended scanning his chest and abdomen and continuing antibiotics.  CT abdomen chest and pelvis shows bibasilar infiltrates suspecting possible pneumonia but no other pathology. 2D Echo EF 60-65%. UDS positive for cocaine  CT head: 1. 2.3 x 2.1 x 2.9 cm acute intraparenchymal hemorrhage centered at the left thalamic capsular region, estimated volume 7 cc. Minimal surrounding vasogenic edema without significant regional mass effect or midline shift. Associated intraventricular extension with small volume intraventricular blood within the adjacent left lateral ventricle. No hydrocephalus or ventricular trapping. 2. No other acute intracranial abnormality. 3. Underlying age-related cerebral atrophy with mild chronic small vessel ischemic disease.    His oral iron was stopped yesterday and GI was consulted given concern of positive FOBT and drop in hemoglobin.  He is at high risk for  getting an EGD in the setting of his acute CVA and lack of overt bleeding.  They are recommending cross-sectional imaging to exclude large GI lesions within the small bowel.  They recommend if patient has progressive anemia, transfusion dependent or requirements they will consider an EGD with VCE placement given that he is unable to swallow.  [name] believes that he could have small bowel pathology and could be having some angiectasia's that have been bleeding given that he had recently had an EGD and colon a few months ago.  Assessment & Plan:   Active Problems:   ICH (intracerebral hemorrhage) (HCC)   SIRS (systemic inflammatory response syndrome) (HCC)   Polysubstance abuse (HCC)   Dysphagia, post-stroke   Hyperglycemia   Hypokalemia   Hypomagnesemia   Fever   SOB (shortness of breath)  Acute Hemorrhagic Stroke/cerebral edema - L BG ICH w/ IVH d/t severe hypertension in setting of alcohol and cocaine use -Code Stroke CT head 10/6 showed L thalamocapsular IPH 7cc w/ minimal edema, mild IVH. Small vessel disease. Atrophy.  -CT head 10/6 2042 enlarging L thalamocapsular IPH w/ increasing IVH from L ventricle into 3rd and 4th ventricles. -CT head 10/7 0510 stable L thalamic IPH w/ IVH w/ clearing of blood in 4th ventricle.  -CTA head and neck 10/8 unremarkable angio, slight increase in midline shift and ventriculomegaly -MRI not able to perform due to no family contact to clear for metal -Repeat CT head 10/9 suboptimal but stable -2D Echo EF 60-65% -UDS positive for cocaine -No antithrombotic prior to admission, now on No antithrombotic given IPH -Therapy recommendations:  CIR vs SNF and will go to CIR once more medically  stable Neurology felt that his exam is essentially unchanged and recommending CIR when medically stable for discharge.  Repeat CT head on 05/12/2020 essentially showed stable left thalamic hemorrhage and intraventricular extension since 05/08/2020.  Regional edema, mild  regional mass-effect and mild lateral ventriculomegaly and trace SAH.  Hypertensive Emergency -Per EMS, SBP > 200  -Home meds:  none -Weaned off off Cleviprex. -Now On Amlodipine 10 and lisinopril 20 bid and as needed IV hydralazine.  Blood pressure mostly controlled with intermittent elevation with intermittent tachycardia and thus I we will add Lopressor -SBP goal < 160 -Long-term BP goal normotensive;  Hyperlipidemia -Home meds:  None -LDL 80, goal < 70 -No statin in setting of IPH -Mild transaminitis, likely related to alcohol, AST/ALT 68/64 ->52/47 -> 35/45 -> and is Now worsening and is 90/78 respectively -Will Consider statin low dose on discharge with normalized liver enzymes and defer to Neurology to initiate   Dysphagia -Secondary to stroke -C/w NPO; SLP on board.  Patient on tube feedings at 65 mL. cortrak in place. SLP managing. On IVF with NS at 30 mL/hr which will now be discontinued   Tobacco Abuse -Patient is a current smoker and will need smoking cessation counseling given once he is more awake  Cocaine abuse -UDS positive for cocaine -Cocaine cessation will be provided when he is more awake -Avoid beta-blocker  Hypophosphatemia Resolved.  Hypokalemia Resolved.  Normocytic Anemia -Patient's Hgb/Hct trending down from 12.0/38.3 -> 11.9/37.9 -> 10.7/34.1 and is trending down to 9.5/30.6 -> 8.7/28.4> 6.8/22.1 received 2 units of PRBC on 05/12/2020> 10.4/32.5. -He is having black stools so iron supplementation was stopped.  FOBT positive.  -Gastroenterology were consulted and recently had an EGD and colonoscopy over the last 2 months.  [name] recommends next up in evaluating would be a video capsule endoscopy but it cannot be formed due to his recent CVA and dysphagia and since his inability swallowed it would have to be done endoscopically and placed an EGD.  Currently endoscopic procedures are high risk with his recent acute CVA and anything small bowel  pathology would require a deep enteroscopy or may not be reachable via endoscopically and likely the pathology would be intestinal angiectasia's.  Dr. Sharla KidneyPirtle recommends continue to monitor carefully. 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  If recurrent transfusion needed, will call GI back.  SIRS with Leukocytosis and Fever in the setting of Aspiration Pneumonia, leukocytosis was worsening up until yesterday.  CBC this morning pending. -Likely in the setting of aspiration pneumonia but will need to rule out other etiologies -CT chest shows bibasilar pneumonia.  CT abdomen pelvis negative.  Culture negative so far.  Last temperature 100.9 at 11 AM on 05/12/2020.  Afebrile since then.  ID thinks that his fever is coming from ICH.  GERD -C/w Pantorpazole 40 mg per Tube Daily qHS and will increase to twice daily   Hypernatremia/hyperchloremia -Mild hyperchloremia but hyponatremia resolved.  Thrombocytopenia Resolved.  Hyperglycemia  -Likely reactive on tube feedings and his hemoglobin A1c is 4.6 Will start on Lantus 10 units.  DVT prophylaxis: SCDs Code Status: FULL CODE Family Communication: No family present at bedside.  Discussed with his wife over the phone.  She wants to continue full code.  I will consult palliative care to have GOC meeting with family. Disposition Plan: Need further clinical improvement back to baseline and  anticipate discharge to CIR versus SNF once medically stable  Status is: Inpatient  Remains inpatient appropriate because:Altered mental status, Unsafe d/c plan, IV treatments appropriate due to intensity of illness or inability to take PO and Inpatient level of care appropriate due to severity of illness   Dispo: The patient is from: Home              Anticipated d/c is to: CIR vs. SNF              Anticipated d/c date is: 3 days               Patient currently is not medically stable to d/c.  Consultants:   Neurology  Gastroenterology  Case was discussed with infectious diseases Dr. Daiva Eves  Procedures:  ECHOCARDIOGRAM IMPRESSIONS    1. Left ventricular ejection fraction, by estimation, is 60 to 65%. The  left ventricle has normal function. The left ventricle has no regional  wall motion abnormalities. Left ventricular diastolic parameters are  consistent with Grade I diastolic  dysfunction (impaired relaxation).  2. Right ventricular systolic function is normal. The right ventricular  size is normal. Tricuspid regurgitation signal is inadequate for assessing  PA pressure.  3. The mitral valve is normal in structure. No evidence of mitral valve  regurgitation. No evidence of mitral stenosis.  4. The aortic valve is tricuspid. Aortic valve regurgitation is not  visualized. No aortic stenosis is present.  5. The inferior vena cava is normal in size with greater than 50%  respiratory variability, suggesting right atrial pressure of 3 mmHg.   FINDINGS  Left Ventricle: Left ventricular ejection fraction, by estimation, is 60  to 65%. The left ventricle has normal function. The left ventricle has no  regional wall motion abnormalities. The left ventricular internal cavity  size was normal in size. There is  no left ventricular hypertrophy. Left ventricular diastolic parameters  are consistent with Grade I diastolic dysfunction (impaired relaxation).   Right Ventricle: The right ventricular size is normal. No increase in  right ventricular wall thickness. Right ventricular systolic function is  normal. Tricuspid regurgitation signal is inadequate for assessing PA  pressure.   Left Atrium: Left atrial size was normal in size.   Right Atrium: Right atrial size was normal in size.   Pericardium: There is no evidence of pericardial effusion.   Mitral Valve: The mitral valve is normal in structure.  No evidence of  mitral valve regurgitation. No evidence of mitral valve stenosis.   Tricuspid Valve: The tricuspid valve is normal in structure. Tricuspid  valve regurgitation is not demonstrated.   Aortic Valve: The aortic valve is tricuspid. Aortic valve regurgitation is  not visualized. No aortic stenosis is present.   Pulmonic Valve: The pulmonic valve was normal in structure. Pulmonic valve  regurgitation is not visualized.   Aorta: The aortic root is normal in size and structure.   Venous: The inferior vena cava is normal in size with greater than 50%  respiratory variability, suggesting right atrial pressure of 3 mmHg.   IAS/Shunts: No atrial level shunt detected by color flow Doppler.     LEFT VENTRICLE  PLAX 2D  LVIDd:     4.30 cm Diastology  LVIDs:     2.70 cm LV e' medial:  8.49 cm/s  LV PW:     1.00 cm LV E/e' medial: 9.2  LV IVS:    0.90 cm LV e' lateral:  7.46 cm/s  LVOT diam:  2.20 cm LV E/e' lateral: 10.4  LV SV:     98  LV SV Index:  57  LVOT Area:   3.80 cm     RIGHT VENTRICLE  RV S prime:   13.90 cm/s  TAPSE (M-mode): 2.0 cm   LEFT ATRIUM       Index    RIGHT ATRIUM      Index  LA diam:    3.10 cm 1.78 cm/m RA Area:   11.60 cm  LA Vol (A2C):  40.9 ml 23.49 ml/m RA Volume:  24.60 ml 14.13 ml/m  LA Vol (A4C):  37.9 ml 21.77 ml/m  LA Biplane Vol: 43.0 ml 24.69 ml/m  AORTIC VALVE  LVOT Vmax:  120.00 cm/s  LVOT Vmean: 75.400 cm/s  LVOT VTI:  0.259 m    AORTA  Ao Root diam: 3.20 cm   MITRAL VALVE  MV Area (PHT): 2.80 cm  SHUNTS  MV Decel Time: 271 msec  Systemic VTI: 0.26 m  MV E velocity: 77.70 cm/s Systemic Diam: 2.20 cm  MV A velocity: 84.50 cm/s  MV E/A ratio: 0.92   Antimicrobials:  Anti-infectives (From admission, onward)   Start     Dose/Rate Route Frequency Ordered Stop   05/13/20 1400  metroNIDAZOLE (FLAGYL) tablet 500 mg        500 mg Oral Every 8  hours 05/13/20 0935     05/12/20 0900  cefTRIAXone (ROCEPHIN) 1 g in sodium chloride 0.9 % 100 mL IVPB        1 g 200 mL/hr over 30 Minutes Intravenous Daily 05/12/20 0817     05/12/20 0900  azithromycin (ZITHROMAX) 500 mg in sodium chloride 0.9 % 250 mL IVPB  Status:  Discontinued        500 mg 250 mL/hr over 60 Minutes Intravenous Daily 05/12/20 0817 05/13/20 0935   05/09/20 0930  Ampicillin-Sulbactam (UNASYN) 3 g in sodium chloride 0.9 % 100 mL IVPB  Status:  Discontinued        3 g 200 mL/hr over 30 Minutes Intravenous Every 8 hours 05/09/20 0837 05/09/20 0839   05/09/20 0930  Ampicillin-Sulbactam (UNASYN) 3 g in sodium chloride 0.9 % 100 mL IVPB  Status:  Discontinued        3 g 200 mL/hr over 30 Minutes Intravenous Every 6 hours 05/09/20 0839 05/12/20 0817        Subjective: Seen and examined.  Has mittens in the left hand.  Once again encephalopathy, mumbles the words which is incomprehensible.  Not following any commands.  Objective: Vitals:   05/12/20 2300 05/13/20 0352 05/13/20 0816 05/13/20 0822  BP: (!) 159/62 (!) 132/58 (!) 139/58 (!) 139/58  Pulse: (!) 101 (!) 108 (!) 114 (!) 114  Resp: (!) 24  Temp: 99.6 F (37.6 C) 99.7 F (37.6 C)  99.7 F (37.6 C)  TempSrc: Oral Oral  Axillary  SpO2: 100% 98% 97% 95%  Weight:        Intake/Output Summary (Last 24 hours) at 05/13/2020 1032 Last data filed at 05/13/2020 0300 Gross per 24 hour  Intake 3778 ml  Output 1425 ml  Net 2353 ml   Filed Weights   05/10/20 0500 05/11/20 0331 05/12/20 0434  Weight: 61.2 kg 61.4 kg 62.5 kg   Examination: Physical Exam:  General exam: Appears encephalopathic and trying to get out of the bed. Respiratory system: Clear to auscultation. Respiratory effort normal. Cardiovascular system: S1 & S2 heard, RRR. No JVD, murmurs, rubs, gallops  or clicks. No pedal edema. Gastrointestinal system: Abdomen is nondistended, soft and nontender. No organomegaly or masses felt. Normal  bowel sounds heard. Central nervous system: Alert but completely disoriented.  unable to assess full neuro exam.  Not following any commands    Data Reviewed: I have personally reviewed following labs and imaging studies  CBC: Recent Labs  Lab 05/08/20 1736 05/08/20 1736 05/09/20 0500 05/10/20 0200 05/11/20 0637 05/12/20 0510 05/12/20 1911  WBC 24.1*  --  26.7* 26.5* 29.3* 31.9*  --   NEUTROABS  --   --   --  20.0* 21.9* 25.3*  --   HGB 11.9*   < > 10.7* 9.5* 8.7* 6.8* 10.4*  HCT 37.9*   < > 34.1* 30.6* 28.4* 22.1* 32.5*  MCV 92.0  --  90.9 92.4 93.7 93.6  --   PLT 150  --  144* 143* 177 189  --    < > = values in this interval not displayed.   Basic Metabolic Panel: Recent Labs  Lab 05/08/20 1032 05/08/20 1032 05/08/20 1736 05/09/20 0500 05/09/20 0827 05/10/20 0200 05/11/20 0637 05/12/20 0510  NA 138  --   --  141  --  142 146* 145  K 3.1*  --   --  3.3*  --  3.4* 4.1 3.8  CL 106  --   --  109  --  110 116* 115*  CO2 21*  --   --  21*  --  23 19* 22  GLUCOSE 139*  --   --  150*  --  146* 157* 205*  BUN 16  --   --  24*  --  24* 21 25*  CREATININE 0.79  --   --  0.96  --  0.90 0.91 1.08  CALCIUM 9.3  --   --  8.7*  --  8.5* 8.9 8.5*  MG 1.9   < > 1.9  --  2.2 2.3 2.1 2.3  PHOS 2.0*   < > 1.5*  --  2.0* 2.6 3.0 3.1   < > = values in this interval not displayed.   GFR: Estimated Creatinine Clearance: 61.1 mL/min (by C-G formula based on SCr of 1.08 mg/dL). Liver Function Tests: Recent Labs  Lab 05/09/20 0827 05/10/20 0200 05/11/20 0637 05/12/20 0510  AST 35 39 90* 118*  ALT 45* 40 78* 86*  ALKPHOS 70 63 71 58  BILITOT 0.5 0.2* 0.7 0.7  PROT 7.5 7.1 7.8 6.7  ALBUMIN 2.8* 2.5* 2.6* 2.2*   No results for input(s): LIPASE, AMYLASE in the last 168 hours. No results for input(s): AMMONIA in the last 168 hours. Coagulation Profile: No results for input(s): INR, PROTIME in the last 168 hours. Cardiac Enzymes: No results for input(s): CKTOTAL, CKMB, CKMBINDEX,  TROPONINI in the last 168 hours. BNP (last 3 results) No results for input(s): PROBNP in the last 8760 hours. HbA1C: No results for input(s): HGBA1C in the last 72 hours. CBG: Recent Labs  Lab 05/12/20 1642 05/12/20 1926 05/12/20 2357 05/13/20 0351 05/13/20 0811  GLUCAP 169* 142* 148* 165* 136*   Lipid Profile: No results for input(s): CHOL, HDL, LDLCALC, TRIG, CHOLHDL, LDLDIRECT in the last 72 hours. Thyroid Function Tests: No results for input(s): TSH, T4TOTAL, FREET4, T3FREE, THYROIDAB in the last 72 hours. Anemia Panel: Recent Labs    05/11/20 0637  VITAMINB12 377  FOLATE 8.0  FERRITIN 254  TIBC 276  IRON 39*  RETICCTPCT 2.0   Sepsis Labs: Recent Labs  Lab 05/12/20  0510  PROCALCITON 0.21    Recent Results (from the past 240 hour(s))  Respiratory Panel by RT PCR (Flu A&B, Covid) - Nasopharyngeal Swab     Status: None   Collection Time: 05/05/20  6:46 PM   Specimen: Nasopharyngeal Swab  Result Value Ref Range Status   SARS Coronavirus 2 by RT PCR NEGATIVE NEGATIVE Final    Comment: (NOTE) SARS-CoV-2 target nucleic acids are NOT DETECTED.  The SARS-CoV-2 RNA is generally detectable in upper respiratoy specimens during the acute phase of infection. The lowest concentration of SARS-CoV-2 viral copies this assay can detect is 131 copies/mL. A negative result does not preclude SARS-Cov-2 infection and should not be used as the sole basis for treatment or other patient management decisions. A negative result may occur with  improper specimen collection/handling, submission of specimen other than nasopharyngeal swab, presence of viral mutation(s) within the areas targeted by this assay, and inadequate number of viral copies (<131 copies/mL). A negative result must be combined with clinical observations, patient history, and epidemiological information. The expected result is Negative.  Fact Sheet for Patients:  https://www.moore.com/  Fact  Sheet for Healthcare Providers:  https://www.young.biz/  This test is no t yet approved or cleared by the [place] FDA and  has been authorized for detection and/or diagnosis of SARS-CoV-2 by FDA under an Emergency Use Authorization (EUA). This EUA will remain  in effect (meaning this test can be used) for the duration of the COVID-19 declaration under Section 564(b)(1) of the Act, 21 U.S.C. section 360bbb-3(b)(1), unless the authorization is terminated or revoked sooner.     Influenza A by PCR NEGATIVE NEGATIVE Final   Influenza B by PCR NEGATIVE NEGATIVE Final    Comment: (NOTE) The Xpert Xpress SARS-CoV-2/FLU/RSV assay is intended as an aid in  the diagnosis of influenza from Nasopharyngeal swab specimens and  should not be used as a sole basis for treatment. Nasal washings and  aspirates are unacceptable for Xpert Xpress SARS-CoV-2/FLU/RSV  testing.  Fact Sheet for Patients: https://www.moore.com/  Fact Sheet for Healthcare Providers: https://www.young.biz/  This test is not yet approved or cleared by the [place] FDA and  has been authorized for detection and/or diagnosis of SARS-CoV-2 by  FDA under an Emergency Use Authorization (EUA). This EUA will remain  in effect (meaning this test can be used) for the duration of the  Covid-19 declaration under Section 564(b)(1) of the Act, 21  U.S.C. section 360bbb-3(b)(1), unless the authorization is  terminated or revoked. Performed at [facility], [address] [place], [place] [address]   MRSA PCR Screening     Status: None   Collection Time: 05/05/20 11:56 PM   Specimen: Nasal Mucosa; Nasopharyngeal  Result Value Ref Range Status   MRSA by PCR NEGATIVE NEGATIVE Final    Comment:        The GeneXpert MRSA Assay (FDA approved for NASAL specimens only), is one component of a comprehensive MRSA colonization surveillance program. It is not intended  to diagnose MRSA infection nor to guide or monitor treatment for MRSA infections. Performed at [facility], [address], [place], [place] [address]   Culture, blood (Routine X 2) w Reflex to ID Panel     Status: None   Collection Time: 05/07/20  7:59 PM   Specimen: BLOOD LEFT HAND  Result Value Ref Range Status   Specimen Description BLOOD LEFT HAND  Final   Special Requests   Final    BOTTLES  DRAWN AEROBIC AND ANAEROBIC Blood Culture adequate volume   Culture   Final    NO GROWTH 5 DAYS Performed at [facility], [address] [place], [place] [address]    Report Status 05/12/2020 FINAL  Final  Culture, blood (Routine X 2) w Reflex to ID Panel     Status: None   Collection Time: 05/07/20  8:06 PM   Specimen: BLOOD RIGHT HAND  Result Value Ref Range Status   Specimen Description BLOOD RIGHT HAND  Final   Special Requests   Final    BOTTLES DRAWN AEROBIC AND ANAEROBIC Blood Culture adequate volume   Culture   Final    NO GROWTH 5 DAYS Performed at [facility], [address], [place], [place] [address]    Report Status 05/12/2020 FINAL  Final  Culture, blood (routine x 2)     Status: None (Preliminary result)   Collection Time: 05/09/20  8:27 AM   Specimen: BLOOD  Result Value Ref Range Status   Specimen Description BLOOD RIGHT ANTECUBITAL  Final   Special Requests   Final    AEROBIC BOTTLE ONLY Blood Culture results may not be optimal due to an inadequate volume of blood received in culture bottles   Culture   Final    NO GROWTH 3 DAYS Performed at [facility], [address] [place], [place] [address]    Report Status PENDING  Incomplete  Culture, blood (routine x 2)     Status: None (Preliminary result)   Collection Time: 05/09/20  8:27 AM   Specimen: BLOOD  Result Value Ref Range Status   Specimen Description BLOOD RIGHT ANTECUBITAL  Final   Special Requests   Final    AEROBIC BOTTLE ONLY Blood Culture results may not be optimal due  to an inadequate volume of blood received in culture bottles   Culture   Final    NO GROWTH 3 DAYS Performed at [facility], [address] [place], [place] [address]    Report Status PENDING  Incomplete  Culture, Urine     Status: Abnormal   Collection Time: 05/09/20  8:40 AM   Specimen: Urine, Random  Result Value Ref Range Status   Specimen Description URINE, RANDOM  Final   Special Requests   Final    NONE Performed at [facility], [address], [place], [place] [address]    Culture >=100,000 COLONIES/mL KLEBSIELLA PNEUMONIAE (A)  Final   Report Status 05/11/2020 FINAL  Final   Organism ID, Bacteria KLEBSIELLA PNEUMONIAE (A)  Final      Susceptibility   Klebsiella pneumoniae - MIC*    AMPICILLIN RESISTANT Resistant     CEFAZOLIN <=4 SENSITIVE Sensitive     CEFTRIAXONE <=0.25 SENSITIVE Sensitive     CIPROFLOXACIN <=0.25 SENSITIVE Sensitive     GENTAMICIN <=1 SENSITIVE Sensitive     IMIPENEM <=0.25 SENSITIVE Sensitive     NITROFURANTOIN 32 SENSITIVE Sensitive     TRIMETH/SULFA <=20 SENSITIVE Sensitive     AMPICILLIN/SULBACTAM 4 SENSITIVE Sensitive     PIP/TAZO <=4 SENSITIVE Sensitive     * >=100,000 COLONIES/mL KLEBSIELLA PNEUMONIAE     RN Pressure Injury Documentation:     Estimated body mass index is 22.24 kg/m as calculated from the following:   Height as of 04/09/20: 5\' 6"  (1.676 m).   Weight as of this encounter: 62.5 kg.  Malnutrition Type:  Nutrition Problem: Inadequate oral intake Etiology: dysphagia, acute illness  Malnutrition Characteristics:  Signs/Symptoms: NPO status   Nutrition Interventions:  Interventions: Tube feeding     Radiology Studies: CT HEAD W & WO CONTRAST  Result Date: 05/12/2020 CLINICAL DATA:  64 year old male with intracranial hemorrhage. Subsequent encounter. EXAM: CT HEAD WITHOUT AND WITH CONTRAST TECHNIQUE: Contiguous axial images were obtained from the base of the skull through the vertex without and  with intravenous contrast CONTRAST:  80mL OMNIPAQUE IOHEXOL 300 MG/ML  SOLN COMPARISON:  Head CTs 05/08/2020 and earlier. FINDINGS: Brain: Irregular hyperdense hemorrhage centered at the posterior left thalamus and lentiform with intraventricular extension again noted. Intra-axial hemorrhage component now estimated at 32 mL - 44 x 33 x 44 mm (AP by transverse by CC). This is stable when measured in the same way as on 05/08/2020. The volume of intraventricular blood is stable, moderate but primarily limited to the left lateral ventricle as before. Small volume layering in the right occipital horn. Trace subarachnoid hemorrhage over the posterior convexities is stable. Regional edema and mild regional mass effect is stable. There is stable mild rightward midline shift. Basilar cisterns remain normal. Mild lateral ventriculomegaly is stable. Stable gray-white matter differentiation elsewhere. No new intracranial abnormality. Vascular: Calcified atherosclerosis at the skull base. No suspicious intracranial vascular hyperdensity. Skull: No acute osseous abnormality identified. Sinuses/Orbits: Left nasoenteric tube in place. Visualized paranasal sinuses and mastoids are stable and well pneumatized. Other: Retained secretions in the nasopharynx. No acute orbit or scalp soft tissue finding. IMPRESSION: 1. 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 Regional edema, mild regional mass effect, mild lateral ventriculomegaly, and trace subarachnoid hemorrhage. 2. No new intracranial abnormality. Electronically Signed   By: Odessa Fleming M.D.   On: 05/12/2020 16:33   CT CHEST W CONTRAST  Result Date: 05/11/2020 CLINICAL DATA:  64 year old male with abdominal pain. Concern for pneumonia effusion, or abscess. EXAM: CT CHEST, ABDOMEN, AND PELVIS WITH CONTRAST TECHNIQUE: Multidetector CT imaging of the chest, abdomen and pelvis was performed following the standard protocol during bolus  administration of intravenous contrast. CONTRAST:  OMNIPAQUE IOHEXOL 300 MG/ML  SOLN COMPARISON:  Chest radiograph dated 05/11/2020. FINDINGS: CT CHEST FINDINGS Cardiovascular: There is no cardiomegaly or pericardial effusion. Coronary vascular calcification with predominant involvement of the LAD and right coronary artery. There is mild atherosclerotic calcification of the thoracic aorta. No aneurysmal dilatation or dissection. The origins of the great vessels of the aortic arch appear patent. Evaluation of the pulmonary arteries is limited due to respiratory motion artifact and suboptimal opacification. No large central pulmonary artery embolus identified. Mediastinum/Nodes: No hilar or mediastinal adenopathy. An enteric tube is noted extending to the distal stomach. The thyroid gland is grossly unremarkable. No mediastinal fluid collection. Lungs/Pleura: Bilateral lower lobe predominant pulmonary opacities most consistent with multifocal pneumonia, possibly atypical in etiology. Clinical correlation is recommended. There is no pleural effusion pneumothorax. The central airways are patent. Musculoskeletal: No chest wall mass or suspicious bone lesions identified. CT ABDOMEN PELVIS FINDINGS No intra-abdominal free air or free fluid. Hepatobiliary: Probable background of mild fatty liver. Ill-defined hypodense lesion in the anterior liver measuring 13 x 20 mm as well as an 18 x 19 mm lesion along the falciform ligament are not characterized on this CT. Further evaluation with MRI without and with contrast on a nonemergent/outpatient basis recommended. There is no intrahepatic biliary ductal dilatation. The gallbladder is unremarkable. Pancreas: Unremarkable. No pancreatic ductal dilatation or surrounding inflammatory changes. Spleen: Normal in size without focal abnormality. Adrenals/Urinary Tract: The adrenal glands unremarkable.  There is no hydronephrosis on either side. There is symmetric enhancement and  excretion of contrast by both kidneys. The visualized ureters and urinary bladder appear unremarkable. Stomach/Bowel: Feeding tube with tip in the distal stomach. There is no bowel obstruction or active inflammation. The appendix is normal. Vascular/Lymphatic: Advanced atherosclerotic calcification of the distal aorta and iliac arteries. The IVC is unremarkable. No portal venous gas. There is no adenopathy. Reproductive: The prostate and seminal vesicles are grossly unremarkable. No pelvic mass. Other: Small fat containing right inguinal hernia. Musculoskeletal: No acute or significant osseous findings. IMPRESSION: 1. Bilateral lower lobe predominant pulmonary opacities most consistent with multifocal pneumonia, possibly atypical in etiology. Clinical correlation is recommended. 2. No acute intra-abdominal or pelvic pathology. No bowel obstruction. Normal appendix. 3. Indeterminate hepatic lesions. Further characterization with MRI without and with contrast recommended. 4. Aortic Atherosclerosis (ICD10-I70.0). Electronically Signed   By: Elgie Collard M.D.   On: 05/11/2020 19:05   CT ABDOMEN PELVIS W CONTRAST  Result Date: 05/11/2020 CLINICAL DATA:  64 year old male with abdominal pain. Concern for pneumonia effusion, or abscess. EXAM: CT CHEST, ABDOMEN, AND PELVIS WITH CONTRAST TECHNIQUE: Multidetector CT imaging of the chest, abdomen and pelvis was performed following the standard protocol during bolus administration of intravenous contrast. CONTRAST:  OMNIPAQUE IOHEXOL 300 MG/ML  SOLN COMPARISON:  Chest radiograph dated 05/11/2020. FINDINGS: CT CHEST FINDINGS Cardiovascular: There is no cardiomegaly or pericardial effusion. Coronary vascular calcification with predominant involvement of the LAD and right coronary artery. There is mild atherosclerotic calcification of the thoracic aorta. No aneurysmal dilatation or dissection. The origins of the great vessels of the aortic arch appear patent.  Evaluation of the pulmonary arteries is limited due to respiratory motion artifact and suboptimal opacification. No large central pulmonary artery embolus identified. Mediastinum/Nodes: No hilar or mediastinal adenopathy. An enteric tube is noted extending to the distal stomach. The thyroid gland is grossly unremarkable. No mediastinal fluid collection. Lungs/Pleura: Bilateral lower lobe predominant pulmonary opacities most consistent with multifocal pneumonia, possibly atypical in etiology. Clinical correlation is recommended. There is no pleural effusion pneumothorax. The central airways are patent. Musculoskeletal: No chest wall mass or suspicious bone lesions identified. CT ABDOMEN PELVIS FINDINGS No intra-abdominal free air or free fluid. Hepatobiliary: Probable background of mild fatty liver. Ill-defined hypodense lesion in the anterior liver measuring 13 x 20 mm as well as an 18 x 19 mm lesion along the falciform ligament are not characterized on this CT. Further evaluation with MRI without and with contrast on a nonemergent/outpatient basis recommended. There is no intrahepatic biliary ductal dilatation. The gallbladder is unremarkable. Pancreas: Unremarkable. No pancreatic ductal dilatation or surrounding inflammatory changes. Spleen: Normal in size without focal abnormality. Adrenals/Urinary Tract: The adrenal glands unremarkable. There is no hydronephrosis on either side. There is symmetric enhancement and excretion of contrast by both kidneys. The visualized ureters and urinary bladder appear unremarkable. Stomach/Bowel: Feeding tube with tip in the distal stomach. There is no bowel obstruction or active inflammation. The appendix is normal. Vascular/Lymphatic: Advanced atherosclerotic calcification of the distal aorta and iliac arteries. The IVC is unremarkable. No portal venous gas. There is no adenopathy. Reproductive: The prostate and seminal vesicles are grossly unremarkable. No pelvic mass. Other:  Small fat containing right inguinal hernia. Musculoskeletal: No acute or significant osseous findings. IMPRESSION: 1. Bilateral lower lobe predominant pulmonary opacities most consistent with multifocal pneumonia, possibly atypical in etiology. Clinical correlation is recommended. 2. No acute intra-abdominal or pelvic pathology. No bowel obstruction. Normal appendix. 3. Indeterminate  hepatic lesions. Further characterization with MRI without and with contrast recommended. 4. Aortic Atherosclerosis (ICD10-I70.0). Electronically Signed   By: Elgie Collard M.D.   On: 05/11/2020 19:05   DG CHEST PORT 1 VIEW  Result Date: 05/12/2020 CLINICAL DATA:  Shortness of breath. EXAM: PORTABLE CHEST 1 VIEW COMPARISON:  May 11, 2020. FINDINGS: The heart size and mediastinal contours are within normal limits. Feeding tube is seen entering stomach. No pneumothorax is noted. Mild bibasilar subsegmental atelectasis or infiltrates are noted. The visualized skeletal structures are unremarkable. IMPRESSION: Mild bibasilar subsegmental atelectasis or infiltrates are noted. Electronically Signed   By: Lupita Raider M.D.   On: 05/12/2020 08:37   Scheduled Meds: . amLODipine  10 mg Per Tube Daily  . chlorhexidine  15 mL Mouth Rinse BID  . Chlorhexidine Gluconate Cloth  6 each Topical Daily  . insulin aspart  0-9 Units Subcutaneous Q4H  . insulin glargine  10 Units Subcutaneous Daily  . ipratropium  0.5 mg Nebulization BID  . levalbuterol  0.63 mg Nebulization BID  . lisinopril  20 mg Per Tube BID  . mouth rinse  15 mL Mouth Rinse q12n4p  . metoprolol tartrate  25 mg Oral BID  . metroNIDAZOLE  500 mg Oral Q8H  . pantoprazole sodium  40 mg Per Tube BID  . senna-docusate  1 tablet Per Tube BID  . sodium chloride flush  10-40 mL Intracatheter Q12H   Continuous Infusions: . cefTRIAXone (ROCEPHIN)  IV 1 g (05/13/20 0910)  . feeding supplement (OSMOLITE 1.2 CAL) 1,000 mL (05/12/20 1743)    LOS: 8 days   Hughie Closs, MD [facility] PAGER is on AMION  If 7PM-7AM, please contact night-coverage [email or web address]

## 2020-05-14 DIAGNOSIS — Z515 Encounter for palliative care: Secondary | ICD-10-CM

## 2020-05-14 DIAGNOSIS — Z7189 Other specified counseling: Secondary | ICD-10-CM

## 2020-05-14 LAB — GLUCOSE, CAPILLARY
Glucose-Capillary: 151 mg/dL — ABNORMAL HIGH (ref 70–99)
Glucose-Capillary: 154 mg/dL — ABNORMAL HIGH (ref 70–99)
Glucose-Capillary: 144 mg/dL — ABNORMAL HIGH (ref 70–99)
Glucose-Capillary: 157 mg/dL — ABNORMAL HIGH (ref 70–99)
Glucose-Capillary: 145 mg/dL — ABNORMAL HIGH (ref 70–99)
Glucose-Capillary: 159 mg/dL — ABNORMAL HIGH (ref 70–99)

## 2020-05-14 LAB — CBC WITH DIFFERENTIAL/PLATELET
Abs Immature Granulocytes: 2.46 10*3/uL — ABNORMAL HIGH (ref 0.00–0.07)
Basophils Absolute: 0.2 10*3/uL — ABNORMAL HIGH (ref 0.0–0.1)
Basophils Relative: 1 %
Eosinophils Absolute: 0.4 10*3/uL (ref 0.0–0.5)
Eosinophils Relative: 2 %
HCT: 27.6 % — ABNORMAL LOW (ref 39.0–52.0)
Hemoglobin: 8.8 g/dL — ABNORMAL LOW (ref 13.0–17.0)
Immature Granulocytes: 8 %
Lymphocytes Relative: 6 %
Lymphs Abs: 1.8 10*3/uL (ref 0.7–4.0)
MCH: 29.1 pg (ref 26.0–34.0)
MCHC: 31.9 g/dL (ref 30.0–36.0)
MCV: 91.4 fL (ref 80.0–100.0)
Monocytes Absolute: 2.7 10*3/uL — ABNORMAL HIGH (ref 0.1–1.0)
Monocytes Relative: 9 %
Neutro Abs: 21.7 10*3/uL — ABNORMAL HIGH (ref 1.7–7.7)
Neutrophils Relative %: 74 %
Platelets: 252 10*3/uL (ref 150–400)
RBC: 3.02 MIL/uL — ABNORMAL LOW (ref 4.22–5.81)
RDW: 20 % — ABNORMAL HIGH (ref 11.5–15.5)
WBC: 29.2 10*3/uL — ABNORMAL HIGH (ref 4.0–10.5)
nRBC: 0.2 % (ref 0.0–0.2)

## 2020-05-14 LAB — BASIC METABOLIC PANEL
Anion gap: 7 (ref 5–15)
BUN: 31 mg/dL — ABNORMAL HIGH (ref 8–23)
CO2: 21 mmol/L — ABNORMAL LOW (ref 22–32)
Calcium: 9 mg/dL (ref 8.9–10.3)
Chloride: 119 mmol/L — ABNORMAL HIGH (ref 98–111)
Creatinine, Ser: 1.08 mg/dL (ref 0.61–1.24)
GFR, Estimated: >60 mL/min (ref >60)
Glucose, Bld: 169 mg/dL — ABNORMAL HIGH (ref 70–99)
Potassium: 4 mmol/L (ref 3.5–5.1)
Sodium: 147 mmol/L — ABNORMAL HIGH (ref 135–145)

## 2020-05-14 LAB — CULTURE, BLOOD (ROUTINE X 2)
Culture: NO GROWTH
Culture: NO GROWTH

## 2020-05-14 LAB — HEPATITIS C ANTIBODY: HCV Ab: REACTIVE — AB

## 2020-05-14 LAB — HEPATITIS B SURFACE ANTIGEN: Hepatitis B Surface Ag: NONREACTIVE

## 2020-05-14 MED ORDER — FREE WATER
150.0000 mL | Freq: Four times a day (QID) | Status: DC
  Administered 2020-05-14 – 2020-05-21 (×24): 150 mL

## 2020-05-14 NOTE — Progress Notes (Signed)
STROKE TEAM PROGRESS NOTE   INTERVAL HISTORY Patient continues to look better  His fever is now down to 99.7.  He is more alert and awake but remains aphasic with dense right hemiplegia.  Vital signs stable.HPOA niece has met with palliative care and wants full support  Vitals:   05/14/20 0500 05/14/20 0720 05/14/20 0740 05/14/20 1109  BP:   135/61 (!) 117/56  Pulse:   98 95  Resp:   18 18  Temp:   99.2 F (37.3 C) 98.6 F (37 C)  TempSrc:   Axillary Axillary  SpO2:  98% 100% 100%  Weight: 64.7 kg      CBC:  Recent Labs  Lab 05/13/20 1009 05/14/20 0117  WBC 33.5* 29.2*  NEUTROABS 24.8* 21.7*  HGB 8.8* 8.8*  HCT 27.5* 27.6*  MCV 89.6 91.4  PLT 232 889   Basic Metabolic Panel:  Recent Labs  Lab 05/11/20 0637 05/11/20 0637 05/12/20 0510 05/12/20 0510 05/13/20 1009 05/14/20 0117  NA 146*   < > 145   < > 149* 147*  K 4.1   < > 3.8   < > 4.0 4.0  CL 116*   < > 115*   < > 118* 119*  CO2 19*   < > 22   < > 19* 21*  GLUCOSE 157*   < > 205*   < > 154* 169*  BUN 21   < > 25*   < > 26* 31*  CREATININE 0.91   < > 1.08   < > 1.08 1.08  CALCIUM 8.9   < > 8.5*   < > 8.7* 9.0  MG 2.1   < > 2.3  --  2.4  --   PHOS 3.0  --  3.1  --   --   --    < > = values in this interval not displayed.   Lipid Panel:  No results for input(s): CHOL, TRIG, HDL, CHOLHDL, VLDL, LDLCALC in the last 168 hours. HgbA1c:  No results for input(s): HGBA1C in the last 168 hours. Urine Drug Screen:  No results for input(s): LABOPIA, COCAINSCRNUR, LABBENZ, AMPHETMU, THCU, LABBARB in the last 168 hours.  Alcohol Level  No results for input(s): ETH in the last 168 hours.  IMAGING past 24 hours No results found.  PHYSICAL EXAM       Temp:  [98.5 F (36.9 C)-99.4 F (37.4 C)] 98.6 F (37 C) (10/15 1109) Pulse Rate:  [95-112] 95 (10/15 1109) Resp:  [18-20] 18 (10/15 1109) BP: (117-155)/(56-66) 117/56 (10/15 1109) SpO2:  [96 %-100 %] 100 % (10/15 1109) Weight:  [64.7 kg] 64.7 kg (10/15  0500)  General -frail middle-aged African-American male, in no apparent distress.  Ophthalmologic - fundi not visualized due to noncooperation.  Cardiovascular - Regular rhythm and rate.  Neuro -awake and alert global aphasia, able to make a few sounds but not words. Only followed occasional midline  Commands  Left gaze preference, abut able to cross midline. Right HH, not blinking to visual threat. PERRL, right upper and lower facial weakness. Tongue protrusion not cooperative. LUE at least 4/5 and LLE at least 3/5. However, RUE 0/5 mild withdraw to pain. RLE no spontaneous movement but withdraw to pain 3-/5. DTR 1+ and right positive babinski. Sensation, coordination not cooperative and gait not tested.   ASSESSMENT/PLAN [name] is a 64 y.o. male with history of alcohol and tobacco abuse presenting with RUE and RLE weakness, R sensory loss and mixed aphasia.  SBP > 200. CT w/ L thalamocapsular IPH w/ IVH. Neuro worsening in ED w/ IVH and hydrocephalus. NS consulted for EVD consideration, which was deferred.   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 Small vessel disease. Atrophy.   CT head 10/6 2042 enlarging L thalamocapsular IPH w/ increasing IVH from L ventricle into 3rd and 4th ventricles.  CT head 10/7 0510 stable L thalamic IPH w/ IVH w/ clearing of blood in 4th ventricle.   CTA head and neck 10/8 unremarkable angio, slight increase in midline shift and ventriculomegaly  MRI not able to perform due to no family contact to clear for metal  Repeat CT head 10/9 suboptimal but stable  2D Echo EF 60-65%  LDL 80  HgbA1c 4.6  UDS positive for cocaine  VTE prophylaxis - SCDs   No antithrombotic prior to admission, now on No antithrombotic given IPH  Therapy recommendations:  pending - ok OOB  Disposition:  pending   Keep in ICU today,  consider xfer tomorrow  Cerebral Edema  CT head 10/6 2042 enlarging L thalamocapsular IPH w/ increasing IVH from L ventricle into 3rd and 4th ventricles.  Neurosurgery consulted Ronnald Ramp) no indication for EVD. Following.  CT head 10/7 0510 stable L thalamic IPH w/ IVH w/ clearing of blood in 4th ventricle.    CTA head and neck 10/8 unremarkable angio, slight increase in midline shift and ventriculomegaly  CT head repeat 10/9 stable  Hypertensive Emergency  Per EMS, SBP > 200   Home meds:  none  Put on Cleviprex for BP control -> now off  Hydralazine IV PRN  On norvasc 10 and lisinopril 20 bid  SBP goal < 160 . Long-term BP goal normotensive  Leukocytosis and fever  WBC 8.8->15.9->16.2-26.7  Tmax afebrile -> 101.3  UA neg  CXR mild cardiomegaly w/ mild central congestion, suspicion mild interstitial edema   Blood culture pending  Hyperlipidemia  Home meds:  None  LDL 80, goal < 70  No statin in setting of IPH  Mild transaminitis, likely related to alcohol, AST/ALP 68/64->52/47  Consider statin low dose on discharge with normalized liver enzymes  Dysphagia . Secondary to stroke . NPO . On IVF @ 30 . cortrak placed . On TF @ 61 . Speech on board   Tobacco abuse  Current smoker  Smoking cessation counseling will be provided  Cocaine abuse  UDS positive for cocaine  Cocaine cessation will be provided  Avoid beta-blocker  Other Stroke Risk Factors  ETOH use, alcohol level <10, advised to drink no more than 2 drink(s) a day  Other Active Problems Fever and leukocytes likely from Aspiration Pneumonia Unasyn started 05/09/2020   Hospital day # 9  Patient seems more alert and fever curve seems to have come down but white count remains 29.2  Continue antibiotics.  Await transfer to rehab over the next few days when medically stable.  Discussed with Dr. Doristine Bosworth  And niece Alan Everett over phone and answered questions. Explained poor neurological  outcome with significant disability. She wants full support for now but will speak with palliative care team again. Greater than 50% time during this 25-minute visit was spent on counseling and coordination of care and discussion with care team.Stroke team will sign off. Call for questions. Alan Contras, MD   To contact Stroke Continuity provider, please refer to  [email or web address] After hours, contact General Neurology

## 2020-05-14 NOTE — Progress Notes (Signed)
PROGRESS NOTE    Alan Everett  BJY:[id number] DOB: 1956/04/28 DOA: 05/05/2020 PCP: Patient, No Pcp Per   Brief Narrative:  HPI per [name] on 05/05/20 Amy Belloso is an 64 y.o. male with a PMHx of alcohol use and tobacco abuse who presented to the ED via EMS as a Code Stroke for acute onset of right sided weakness. he also had sensory loss and weakness involving his RUE and RLE. 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 Neurosurgery was consulted for EVD consideration which was then deferred.  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  Transferred under [facility] on 05/09/2020.  On 05/08/2020, patient spiked fever and he was started on broad-spectrum antibiotics/IV Zosyn empirically.  Despite of antibiotics, patient's white blood cells as well as fever continue to get worse with a T-max of 103.2 at around noon on 05/11/2020.  Previous hospitalist [name] had discussed case with ID who recommended scanning his chest and abdomen and continuing antibiotics.  CT abdomen chest and pelvis shows bibasilar infiltrates suspecting possible pneumonia but no other pathology. 2D Echo EF 60-65%. UDS positive for cocaine  CT head: 1. 2.3 x 2.1 x 2.9 cm acute intraparenchymal hemorrhage centered at the left thalamic capsular region, estimated volume 7 cc. Minimal surrounding vasogenic edema without significant regional mass effect or midline shift. Associated intraventricular extension with small volume intraventricular blood within the adjacent left lateral ventricle. No hydrocephalus or ventricular trapping. 2. No other acute intracranial abnormality. 3. Underlying age-related cerebral atrophy with mild chronic small vessel ischemic disease.    His oral iron was stopped yesterday and GI was consulted given concern of positive FOBT and drop in hemoglobin.  He is at high risk for  getting an EGD in the setting of his acute CVA and lack of overt bleeding.  They are recommending cross-sectional imaging to exclude large GI lesions within the small bowel.  They recommend if patient has progressive anemia, transfusion dependent or requirements they will consider an EGD with VCE placement given that he is unable to swallow.  [name] believes that he could have small bowel pathology and could be having some angiectasia's that have been bleeding given that he had recently had an EGD and colon a few months ago.  Assessment & Plan:   Active Problems:   ICH (intracerebral hemorrhage) (HCC)   SIRS (systemic inflammatory response syndrome) (HCC)   Polysubstance abuse (HCC)   Dysphagia, post-stroke   Hyperglycemia   Hypokalemia   Hypomagnesemia   Fever   SOB (shortness of breath)   Leukocytosis   Aspiration pneumonia of both lower lobes (HCC)  Acute Hemorrhagic Stroke/cerebral edema - L BG ICH w/ IVH d/t severe hypertension in setting of alcohol and cocaine use -Code Stroke CT head 10/6 showed L thalamocapsular IPH 7cc w/ minimal edema, mild IVH. Small vessel disease. Atrophy.  -CT head 10/6 2042 enlarging L thalamocapsular IPH w/ increasing IVH from L ventricle into 3rd and 4th ventricles. -CT head 10/7 0510 stable L thalamic IPH w/ IVH w/ clearing of blood in 4th ventricle.  -CTA head and neck 10/8 unremarkable angio, slight increase in midline shift and ventriculomegaly -MRI not able to perform due to no family contact to clear for metal -Repeat CT head 10/9 suboptimal but stable -2D Echo EF 60-65% -UDS positive for cocaine -No antithrombotic prior to admission, now on No antithrombotic given IPH -Therapy recommendations:  CIR vs SNF and will go to CIR once more medically stable Neurology felt that his exam is essentially unchanged and recommending CIR when medically stable for discharge.  Repeat CT head on 05/12/2020 essentially showed stable left thalamic hemorrhage and  intraventricular extension since 05/08/2020.  Regional edema, mild regional mass-effect and mild lateral ventriculomegaly and trace SAH.  Hypertensive Emergency -Per EMS, SBP > 200  -Home meds:  none -Weaned off off Cleviprex. -Now On Amlodipine 10 and lisinopril 20 bid and as needed IV hydralazine.  Blood pressure mostly controlled with intermittent elevation with intermittent tachycardia and thus Lopressor was added which I will continue. -SBP goal < 160 -Long-term BP goal normotensive;  Hyperlipidemia -Home meds:  None -LDL 80, goal < 70 -No statin in setting of IPH -Mild transaminitis, likely related to alcohol, AST/ALT 68/64 ->52/47 -> 35/45 -> and is Now worsening and is 90/78 respectively -Will Consider statin low dose on discharge with normalized liver enzymes and defer to Neurology to initiate   Dysphagia -Secondary to stroke -C/w NPO; SLP on board.  Patient on tube feedings at 65 mL. cortrak in place. SLP managing. On IVF with NS at 30 mL/hr which will now be discontinued   Tobacco Abuse -Patient is a current smoker and will need smoking cessation counseling given once he is more awake  Cocaine abuse -UDS positive for cocaine -Cocaine cessation will be provided when he is more awake -Avoid beta-blocker  Hypophosphatemia Resolved.  Hypokalemia Resolved.  Normocytic Anemia -Patient's Hgb/Hct trending down from 12.0/38.3 -> 11.9/37.9 -> 10.7/34.1 and is trending down to 9.5/30.6 -> 8.7/28.4> 6.8/22.1 received 2 units of PRBC on 05/12/2020> 10.4/32.5. -He is having black stools so iron supplementation was stopped.  FOBT positive.  -Gastroenterology were consulted and recently had an EGD and colonoscopy over the last 2 months.  [name] recommends next up in evaluating would be a video capsule endoscopy but it cannot be formed due to his recent CVA and dysphagia and since his inability swallowed it would have to be done endoscopically and placed an EGD.  Currently  endoscopic procedures are high risk with his recent acute CVA and anything small bowel pathology would require a deep enteroscopy or may not be reachable via endoscopically and likely the pathology would be intestinal angiectasia's.  [name] recommends continue to monitor carefully. 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  If recurrent transfusion needed, will call GI back.  SIRS with Leukocytosis and Fever in the setting of Aspiration Pneumonia, leukocytosis was worsening up until yesterday.  -Likely in the setting of aspiration pneumonia. -CT chest shows bibasilar pneumonia.  CT abdomen pelvis negative.  Culture negative so far.  Last temperature 100.9 at 11 AM on 05/12/2020.  Afebrile since then.  ID thinks that his fever is coming from ICH.  However they added Flagyl to his current regime of Rocephin and Zithromax on 05/13/2020.  Fortunately, his white cells have started to improve today.  Appreciate ID help.  GERD -C/w Pantorpazole 40 mg per Tube Daily qHS and will increase to twice daily   Hypernatremia/hyperchloremia -Mild hyperchloremia but hyponatremia resolved.  Thrombocytopenia Resolved.  Hyperglycemia  -Likely reactive on tube feedings and his hemoglobin A1c is 4.6 Will start on Lantus 10 units.  DVT prophylaxis: SCDs Code Status: FULL CODE Family Communication: No family present at bedside.  Discussed with his wife over the  phone yesterday.  She wants to continue full code.  Palliative care to see today. Disposition Plan: Need further clinical improvement back to baseline and anticipate discharge to CIR versus SNF once medically stable  Status is: Inpatient  Remains inpatient appropriate because:Altered mental status, Unsafe d/c plan, IV treatments appropriate due to intensity of illness or inability to take PO and Inpatient level of care  appropriate due to severity of illness   Dispo: The patient is from: Home              Anticipated d/c is to: CIR vs. SNF              Anticipated d/c date is: 3 days              Patient currently is not medically stable to d/c.  Consultants:   Neurology  Gastroenterology  Case was discussed with infectious diseases Dr. Daiva Eves  Procedures:  ECHOCARDIOGRAM IMPRESSIONS    1. Left ventricular ejection fraction, by estimation, is 60 to 65%. The  left ventricle has normal function. The left ventricle has no regional  wall motion abnormalities. Left ventricular diastolic parameters are  consistent with Grade I diastolic  dysfunction (impaired relaxation).  2. Right ventricular systolic function is normal. The right ventricular  size is normal. Tricuspid regurgitation signal is inadequate for assessing  PA pressure.  3. The mitral valve is normal in structure. No evidence of mitral valve  regurgitation. No evidence of mitral stenosis.  4. The aortic valve is tricuspid. Aortic valve regurgitation is not  visualized. No aortic stenosis is present.  5. The inferior vena cava is normal in size with greater than 50%  respiratory variability, suggesting right atrial pressure of 3 mmHg.   FINDINGS  Left Ventricle: Left ventricular ejection fraction, by estimation, is 60  to 65%. The left ventricle has normal function. The left ventricle has no  regional wall motion abnormalities. The left ventricular internal cavity  size was normal in size. There is  no left ventricular hypertrophy. Left ventricular diastolic parameters  are consistent with Grade I diastolic dysfunction (impaired relaxation).   Right Ventricle: The right ventricular size is normal. No increase in  right ventricular wall thickness. Right ventricular systolic function is  normal. Tricuspid regurgitation signal is inadequate for assessing PA  pressure.   Left Atrium: Left atrial size was normal in size.    Right Atrium: Right atrial size was normal in size.   Pericardium: There is no evidence of pericardial effusion.   Mitral Valve: The mitral valve is normal in structure. No evidence of  mitral valve regurgitation. No evidence of mitral valve stenosis.   Tricuspid Valve: The tricuspid valve is normal in structure. Tricuspid  valve regurgitation is not demonstrated.   Aortic Valve: The aortic valve is tricuspid. Aortic valve regurgitation is  not visualized. No aortic stenosis is present.   Pulmonic Valve: The pulmonic valve was normal in structure. Pulmonic valve  regurgitation is not visualized.   Aorta: The aortic root is normal in size and structure.   Venous: The inferior vena cava is normal in size with greater than 50%  respiratory variability, suggesting right atrial pressure of 3 mmHg.   IAS/Shunts: No atrial level shunt detected by color flow Doppler.     LEFT VENTRICLE  PLAX 2D  LVIDd:     4.30 cm Diastology  LVIDs:     2.70 cm LV e' medial:  8.49 cm/s  LV PW:  1.00 cm LV E/e' medial: 9.2  LV IVS:    0.90 cm LV e' lateral:  7.46 cm/s  LVOT diam:   2.20 cm LV E/e' lateral: 10.4  LV SV:     98  LV SV Index:  57  LVOT Area:   3.80 cm     RIGHT VENTRICLE  RV S prime:   13.90 cm/s  TAPSE (M-mode): 2.0 cm   LEFT ATRIUM       Index    RIGHT ATRIUM      Index  LA diam:    3.10 cm 1.78 cm/m RA Area:   11.60 cm  LA Vol (A2C):  40.9 ml 23.49 ml/m RA Volume:  24.60 ml 14.13 ml/m  LA Vol (A4C):  37.9 ml 21.77 ml/m  LA Biplane Vol: 43.0 ml 24.69 ml/m  AORTIC VALVE  LVOT Vmax:  120.00 cm/s  LVOT Vmean: 75.400 cm/s  LVOT VTI:  0.259 m    AORTA  Ao Root diam: 3.20 cm   MITRAL VALVE  MV Area (PHT): 2.80 cm  SHUNTS  MV Decel Time: 271 msec  Systemic VTI: 0.26 m  MV E velocity: 77.70 cm/s Systemic Diam: 2.20 cm  MV A velocity: 84.50 cm/s  MV E/A ratio: 0.92   Antimicrobials:   Anti-infectives (From admission, onward)   Start     Dose/Rate Route Frequency Ordered Stop   05/14/20 1000  cefTRIAXone (ROCEPHIN) 2 g in sodium chloride 0.9 % 100 mL IVPB        2 g 200 mL/hr over 30 Minutes Intravenous Daily 05/13/20 1454 05/15/20 2359   05/13/20 2200  metroNIDAZOLE (FLAGYL) tablet 500 mg        500 mg Per Tube Every 8 hours 05/13/20 1847 05/15/20 2359   05/13/20 1400  metroNIDAZOLE (FLAGYL) tablet 500 mg  Status:  Discontinued        500 mg Oral Every 8 hours 05/13/20 0935 05/13/20 1847   05/12/20 0900  cefTRIAXone (ROCEPHIN) 1 g in sodium chloride 0.9 % 100 mL IVPB  Status:  Discontinued        1 g 200 mL/hr over 30 Minutes Intravenous Daily 05/12/20 0817 05/13/20 1454   05/12/20 0900  azithromycin (ZITHROMAX) 500 mg in sodium chloride 0.9 % 250 mL IVPB  Status:  Discontinued        500 mg 250 mL/hr over 60 Minutes Intravenous Daily 05/12/20 0817 05/13/20 0935   05/09/20 0930  Ampicillin-Sulbactam (UNASYN) 3 g in sodium chloride 0.9 % 100 mL IVPB  Status:  Discontinued        3 g 200 mL/hr over 30 Minutes Intravenous Every 8 hours 05/09/20 0837 05/09/20 0839   05/09/20 0930  Ampicillin-Sulbactam (UNASYN) 3 g in sodium chloride 0.9 % 100 mL IVPB  Status:  Discontinued        3 g 200 mL/hr over 30 Minutes Intravenous Every 6 hours 05/09/20 0839 05/12/20 0817        Subjective: Seen and examined.  Status ago with no change.  Laying on the left side of the body with dysarthric speech.  Completely incoherent and not following any commands.  Objective: Vitals:   05/14/20 0500 05/14/20 0720 05/14/20 0740 05/14/20 1109  BP:   135/61 (!) 117/56  Pulse:   98 95  Resp:   18 18  Temp:   99.2 F (37.3 C) 98.6 F (37 C)  TempSrc:   Axillary Axillary  SpO2:  98% 100% 100%  Weight: 64.7 kg  Intake/Output Summary (Last 24 hours) at 05/14/2020 1124 Last data filed at 05/14/2020 0615 Gross per 24 hour  Intake 929.17 ml  Output 1225 ml  Net -295.83 ml   Filed  Weights   05/11/20 0331 05/12/20 0434 05/14/20 0500  Weight: 61.4 kg 62.5 kg 64.7 kg   Examination: Physical Exam:  General exam: Appears encephalopathic Respiratory system: Clear to auscultation. Respiratory effort normal. Cardiovascular system: S1 & S2 heard, RRR. No JVD, murmurs, rubs, gallops or clicks. No pedal edema. Gastrointestinal system: Abdomen is nondistended, soft and nontender. No organomegaly or masses felt. Normal bowel sounds heard. Central nervous system: Alert but disoriented.  Unable to complete thorough neuro exam due to his inability to follow commands.  Data Reviewed: I have personally reviewed following labs and imaging studies  CBC: Recent Labs  Lab 05/10/20 0200 05/10/20 0200 05/11/20 0637 05/12/20 0510 05/12/20 1911 05/13/20 1009 05/14/20 0117  WBC 26.5*  --  29.3* 31.9*  --  33.5* 29.2*  NEUTROABS 20.0*  --  21.9* 25.3*  --  24.8* 21.7*  HGB 9.5*   < > 8.7* 6.8* 10.4* 8.8* 8.8*  HCT 30.6*   < > 28.4* 22.1* 32.5* 27.5* 27.6*  MCV 92.4  --  93.7 93.6  --  89.6 91.4  PLT 143*  --  177 189  --  232 252   < > = values in this interval not displayed.   Basic Metabolic Panel: Recent Labs  Lab 05/08/20 1736 05/09/20 0500 05/09/20 0827 05/10/20 0200 05/11/20 7035 05/12/20 0510 05/13/20 1009 05/14/20 0117  NA  --    < >  --  142 146* 145 149* 147*  K  --    < >  --  3.4* 4.1 3.8 4.0 4.0  CL  --    < >  --  110 116* 115* 118* 119*  CO2  --    < >  --  23 19* 22 19* 21*  GLUCOSE  --    < >  --  146* 157* 205* 154* 169*  BUN  --    < >  --  24* 21 25* 26* 31*  CREATININE  --    < >  --  0.90 0.91 1.08 1.08 1.08  CALCIUM  --    < >  --  8.5* 8.9 8.5* 8.7* 9.0  MG 1.9  --  2.2 2.3 2.1 2.3 2.4  --   PHOS 1.5*  --  2.0* 2.6 3.0 3.1  --   --    < > = values in this interval not displayed.   GFR: Estimated Creatinine Clearance: 62.4 mL/min (by C-G formula based on SCr of 1.08 mg/dL). Liver Function Tests: Recent Labs  Lab 05/09/20 0827  05/10/20 0200 05/11/20 0637 05/12/20 0510  AST 35 39 90* 118*  ALT 45* 40 78* 86*  ALKPHOS 70 63 71 58  BILITOT 0.5 0.2* 0.7 0.7  PROT 7.5 7.1 7.8 6.7  ALBUMIN 2.8* 2.5* 2.6* 2.2*   No results for input(s): LIPASE, AMYLASE in the last 168 hours. No results for input(s): AMMONIA in the last 168 hours. Coagulation Profile: No results for input(s): INR, PROTIME in the last 168 hours. Cardiac Enzymes: No results for input(s): CKTOTAL, CKMB, CKMBINDEX, TROPONINI in the last 168 hours. BNP (last 3 results) No results for input(s): PROBNP in the last 8760 hours. HbA1C: No results for input(s): HGBA1C in the last 72 hours. CBG: Recent Labs  Lab 05/13/20 1933 05/13/20 2325 05/14/20 0325  05/14/20 0816 05/14/20 1114  GLUCAP 152* 156* 151* 154* 144*   Lipid Profile: No results for input(s): CHOL, HDL, LDLCALC, TRIG, CHOLHDL, LDLDIRECT in the last 72 hours. Thyroid Function Tests: No results for input(s): TSH, T4TOTAL, FREET4, T3FREE, THYROIDAB in the last 72 hours. Anemia Panel: No results for input(s): VITAMINB12, FOLATE, FERRITIN, TIBC, IRON, RETICCTPCT in the last 72 hours. Sepsis Labs: Recent Labs  Lab 05/12/20 0510  PROCALCITON 0.21    Recent Results (from the past 240 hour(s))  Respiratory Panel by RT PCR (Flu A&B, Covid) - Nasopharyngeal Swab     Status: None   Collection Time: 05/05/20  6:46 PM   Specimen: Nasopharyngeal Swab  Result Value Ref Range Status   SARS Coronavirus 2 by RT PCR NEGATIVE NEGATIVE Final    Comment: (NOTE) SARS-CoV-2 target nucleic acids are NOT DETECTED.  The SARS-CoV-2 RNA is generally detectable in upper respiratoy specimens during the acute phase of infection. The lowest concentration of SARS-CoV-2 viral copies this assay can detect is 131 copies/mL. A negative result does not preclude SARS-Cov-2 infection and should not be used as the sole basis for treatment or other patient management decisions. A negative result may occur with   improper specimen collection/handling, submission of specimen other than nasopharyngeal swab, presence of viral mutation(s) within the areas targeted by this assay, and inadequate number of viral copies (<131 copies/mL). A negative result must be combined with clinical observations, patient history, and epidemiological information. The expected result is Negative.  Fact Sheet for Patients:  https://www.moore.com/https://www.fda.gov/media/142436/download  Fact Sheet for Healthcare Providers:  https://www.young.biz/https://www.fda.gov/media/142435/download  This test is no t yet approved or cleared by the [place] FDA and  has been authorized for detection and/or diagnosis of SARS-CoV-2 by FDA under an Emergency Use Authorization (EUA). This EUA will remain  in effect (meaning this test can be used) for the duration of the COVID-19 declaration under Section 564(b)(1) of the Act, 21 U.S.C. section 360bbb-3(b)(1), unless the authorization is terminated or revoked sooner.     Influenza A by PCR NEGATIVE NEGATIVE Final   Influenza B by PCR NEGATIVE NEGATIVE Final    Comment: (NOTE) The Xpert Xpress SARS-CoV-2/FLU/RSV assay is intended as an aid in  the diagnosis of influenza from Nasopharyngeal swab specimens and  should not be used as a sole basis for treatment. Nasal washings and  aspirates are unacceptable for Xpert Xpress SARS-CoV-2/FLU/RSV  testing.  Fact Sheet for Patients: https://www.moore.com/https://www.fda.gov/media/142436/download  Fact Sheet for Healthcare Providers: https://www.young.biz/https://www.fda.gov/media/142435/download  This test is not yet approved or cleared by the [place] FDA and  has been authorized for detection and/or diagnosis of SARS-CoV-2 by  FDA under an Emergency Use Authorization (EUA). This EUA will remain  in effect (meaning this test can be used) for the duration of the  Covid-19 declaration under Section 564(b)(1) of the Act, 21  U.S.C. section 360bbb-3(b)(1), unless the authorization is  terminated or  revoked. Performed at [facility], [address] [place], [place] [id number]   MRSA PCR Screening     Status: None   Collection Time: 05/05/20 11:56 PM   Specimen: Nasal Mucosa; Nasopharyngeal  Result Value Ref Range Status   MRSA by PCR NEGATIVE NEGATIVE Final    Comment:        The GeneXpert MRSA Assay (FDA approved for NASAL specimens only), is one component of a comprehensive MRSA colonization surveillance program. It is not intended to diagnose MRSA infection nor to guide or monitor treatment for MRSA infections.  Performed at [facility], [address], [place], [place] [address]   Culture, blood (Routine X 2) w Reflex to ID Panel     Status: None   Collection Time: 05/07/20  7:59 PM   Specimen: BLOOD LEFT HAND  Result Value Ref Range Status   Specimen Description BLOOD LEFT HAND  Final   Special Requests   Final    BOTTLES DRAWN AEROBIC AND ANAEROBIC Blood Culture adequate volume   Culture   Final    NO GROWTH 5 DAYS Performed at [facility], [address] [place], [place] [address]    Report Status 05/12/2020 FINAL  Final  Culture, blood (Routine X 2) w Reflex to ID Panel     Status: None   Collection Time: 05/07/20  8:06 PM   Specimen: BLOOD RIGHT HAND  Result Value Ref Range Status   Specimen Description BLOOD RIGHT HAND  Final   Special Requests   Final    BOTTLES DRAWN AEROBIC AND ANAEROBIC Blood Culture adequate volume   Culture   Final    NO GROWTH 5 DAYS Performed at [facility], [address], [place], [place] [address]    Report Status 05/12/2020 FINAL  Final  Culture, blood (routine x 2)     Status: None   Collection Time: 05/09/20  8:27 AM   Specimen: BLOOD  Result Value Ref Range Status   Specimen Description BLOOD RIGHT ANTECUBITAL  Final   Special Requests   Final    AEROBIC BOTTLE ONLY Blood Culture results may not be optimal due to an inadequate volume of blood received in culture bottles   Culture   Final     NO GROWTH 5 DAYS Performed at [facility], [address] [place], [place] [address]    Report Status 05/14/2020 FINAL  Final  Culture, blood (routine x 2)     Status: None   Collection Time: 05/09/20  8:27 AM   Specimen: BLOOD  Result Value Ref Range Status   Specimen Description BLOOD RIGHT ANTECUBITAL  Final   Special Requests   Final    AEROBIC BOTTLE ONLY Blood Culture results may not be optimal due to an inadequate volume of blood received in culture bottles   Culture   Final    NO GROWTH 5 DAYS Performed at [facility], [address], [place], [place] [address]    Report Status 05/14/2020 FINAL  Final  Culture, Urine     Status: Abnormal   Collection Time: 05/09/20  8:40 AM   Specimen: Urine, Random  Result Value Ref Range Status   Specimen Description URINE, RANDOM  Final   Special Requests   Final    NONE Performed at [facility], [address] [place], [place] [address]    Culture >=100,000 COLONIES/mL KLEBSIELLA PNEUMONIAE (A)  Final   Report Status 05/11/2020 FINAL  Final   Organism ID, Bacteria KLEBSIELLA PNEUMONIAE (A)  Final      Susceptibility   Klebsiella pneumoniae - MIC*    AMPICILLIN RESISTANT Resistant     CEFAZOLIN <=4 SENSITIVE Sensitive     CEFTRIAXONE <=0.25 SENSITIVE Sensitive     CIPROFLOXACIN <=0.25 SENSITIVE Sensitive     GENTAMICIN <=1 SENSITIVE Sensitive     IMIPENEM <=0.25 SENSITIVE Sensitive     NITROFURANTOIN 32 SENSITIVE Sensitive     TRIMETH/SULFA <=20 SENSITIVE Sensitive     AMPICILLIN/SULBACTAM 4 SENSITIVE Sensitive  PIP/TAZO <=4 SENSITIVE Sensitive     * >=100,000 COLONIES/mL KLEBSIELLA PNEUMONIAE     RN Pressure Injury Documentation:     Estimated body mass index is 23.02 kg/m as calculated from the following:   Height as of 04/09/20: 5\' 6"  (1.676 m).   Weight as of this encounter: 64.7 kg.  Malnutrition Type:  Nutrition Problem: Inadequate oral intake Etiology: dysphagia, acute  illness   Malnutrition Characteristics:  Signs/Symptoms: NPO status   Nutrition Interventions:  Interventions: Tube feeding     Radiology Studies: CT HEAD W & WO CONTRAST  Result Date: 05/12/2020 CLINICAL DATA:  64 year old male with intracranial hemorrhage. Subsequent encounter. EXAM: CT HEAD WITHOUT AND WITH CONTRAST TECHNIQUE: Contiguous axial images were obtained from the base of the skull through the vertex without and with intravenous contrast CONTRAST:  59mL OMNIPAQUE IOHEXOL 300 MG/ML  SOLN COMPARISON:  Head CTs 05/08/2020 and earlier. FINDINGS: Brain: Irregular hyperdense hemorrhage centered at the posterior left thalamus and lentiform with intraventricular extension again noted. Intra-axial hemorrhage component now estimated at 32 mL - 44 x 33 x 44 mm (AP by transverse by CC). This is stable when measured in the same way as on 05/08/2020. The volume of intraventricular blood is stable, moderate but primarily limited to the left lateral ventricle as before. Small volume layering in the right occipital horn. Trace subarachnoid hemorrhage over the posterior convexities is stable. Regional edema and mild regional mass effect is stable. There is stable mild rightward midline shift. Basilar cisterns remain normal. Mild lateral ventriculomegaly is stable. Stable gray-white matter differentiation elsewhere. No new intracranial abnormality. Vascular: Calcified atherosclerosis at the skull base. No suspicious intracranial vascular hyperdensity. Skull: No acute osseous abnormality identified. Sinuses/Orbits: Left nasoenteric tube in place. Visualized paranasal sinuses and mastoids are stable and well pneumatized. Other: Retained secretions in the nasopharynx. No acute orbit or scalp soft tissue finding. IMPRESSION: 1. 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 Regional edema, mild regional mass effect, mild lateral ventriculomegaly, and trace subarachnoid  hemorrhage. 2. No new intracranial abnormality. Electronically Signed   By: 07/08/2020 M.D.   On: 05/12/2020 16:33   Scheduled Meds: . amLODipine  10 mg Per Tube Daily  . chlorhexidine  15 mL Mouth Rinse BID  . Chlorhexidine Gluconate Cloth  6 each Topical Daily  . free water  150 mL Per Tube Q6H  . insulin aspart  0-9 Units Subcutaneous Q4H  . insulin glargine  10 Units Subcutaneous Daily  . ipratropium  0.5 mg Nebulization BID  . levalbuterol  0.63 mg Nebulization BID  . lisinopril  20 mg Per Tube BID  . mouth rinse  15 mL Mouth Rinse q12n4p  . metoprolol tartrate  25 mg Per Tube BID  . metroNIDAZOLE  500 mg Per Tube Q8H  . pantoprazole sodium  40 mg Per Tube BID  . senna-docusate  1 tablet Per Tube BID  . sodium chloride flush  10-40 mL Intracatheter Q12H   Continuous Infusions: . cefTRIAXone (ROCEPHIN)  IV 2 g (05/14/20 0848)  . feeding supplement (OSMOLITE 1.2 CAL) 1,000 mL (05/12/20 1743)    LOS: 9 days   05/14/20, MD [facility] PAGER is on AMION  If 7PM-7AM, please contact night-coverage [email or web address]

## 2020-05-14 NOTE — Progress Notes (Signed)
[facility]  Date of Admission:  05/05/2020      Total days of antibiotics 6  Day 3 ceftriaxone   Day 2 metronidazole            ASSESSMENT: Alan Everett is a 64 y.o. 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 We are seeing him for fevers / leukocytosis.   He seems improved overall today compared to the last 2 days. Afebrile now x 24 hours, more alert today on our exam; leukocytosis holding and slightly decreased. Unclear if related to aspiration event vs reactive from IVC/ICH. Procalcitonin was < 0.25 so that argues against bacterial process. Would continue current antimicrobials through 7 days then observe off antibiotics.   His hepatitis C Ab is positive - qualitative RNA is ordered and pending from 10/14. If he has chronic infection will need to refer to [facility] outpatient for consideration of treatment.  CT abdomen revealed liver lesions that will need to be further characterized with MRI prior to treatment. No cirrhotic stigmata but MRI more sensitive to evaluate this as well.     PLAN: 1. Continue antibiotics through 10/16 then stop  2. Will need outpatient appointment with ID if quantitative RNA is positive for consideration of Hepatitis C treatment.   ID will sign off - happy to see him back if any changes in his condition or any questions.    Active Problems:   ICH (intracerebral hemorrhage) (HCC)   SIRS (systemic inflammatory response syndrome) (HCC)   Polysubstance abuse (HCC)   Dysphagia, post-stroke   Hyperglycemia   Hypokalemia   Hypomagnesemia   Fever   SOB (shortness of breath)   Leukocytosis   Aspiration pneumonia of both lower lobes (HCC)   . amLODipine  10 mg Per Tube Daily  . chlorhexidine  15 mL Mouth Rinse BID  . Chlorhexidine Gluconate Cloth  6 each Topical Daily  . free water  150 mL Per Tube Q6H  . insulin aspart  0-9 Units Subcutaneous Q4H  . insulin glargine  10 Units Subcutaneous Daily  .  ipratropium  0.5 mg Nebulization BID  . levalbuterol  0.63 mg Nebulization BID  . lisinopril  20 mg Per Tube BID  . mouth rinse  15 mL Mouth Rinse q12n4p  . metoprolol tartrate  25 mg Per Tube BID  . metroNIDAZOLE  500 mg Per Tube Q8H  . pantoprazole sodium  40 mg Per Tube BID  . senna-docusate  1 tablet Per Tube BID  . sodium chloride flush  10-40 mL Intracatheter Q12H    SUBJECTIVE: Non verbal.    Review of Systems: Review of Systems  Unable to perform ROS: Patient nonverbal    No Known Allergies  OBJECTIVE: Vitals:   05/14/20 0500 05/14/20 0720 05/14/20 0740 05/14/20 1109  BP:   135/61 (!) 117/56  Pulse:   98 95  Resp:   18 18  Temp:   99.2 F (37.3 C) 98.6 F (37 C)  TempSrc:   Axillary Axillary  SpO2:  98% 100% 100%  Weight: 64.7 kg      Body mass index is 23.02 kg/m.  Physical Exam Constitutional:      Appearance: He is ill-appearing.     Comments: Resting in bed on left side.  HENT:     Mouth/Throat:     Mouth: Mucous membranes are moist.     Pharynx: Oropharynx is clear.  Eyes:  General: No scleral icterus. Cardiovascular:     Rate and Rhythm: Normal rate and regular rhythm.     Heart sounds: No murmur heard.   Pulmonary:     Effort: Pulmonary effort is normal.     Breath sounds: Rhonchi present.  Abdominal:     General: Bowel sounds are normal. There is no distension.     Palpations: Abdomen is soft.     Tenderness: There is no abdominal tenderness.  Skin:    General: Skin is warm and dry.     Capillary Refill: Capillary refill takes less than 2 seconds.  Neurological:     Mental Status: He is alert.     Comments:  Moving only left side. More awake today. Aphasic.     Lab Results Lab Results  Component Value Date   WBC 29.2 (H) 05/14/2020   HGB 8.8 (L) 05/14/2020   HCT 27.6 (L) 05/14/2020   MCV 91.4 05/14/2020   PLT 252 05/14/2020    Lab Results  Component Value Date   CREATININE 1.08 05/14/2020   BUN 31 (H) 05/14/2020   NA  147 (H) 05/14/2020   K 4.0 05/14/2020   CL 119 (H) 05/14/2020   CO2 21 (L) 05/14/2020    Lab Results  Component Value Date   ALT 86 (H) 05/12/2020   AST 118 (H) 05/12/2020   ALKPHOS 58 05/12/2020   BILITOT 0.7 05/12/2020     Microbiology: Recent Results (from the past 240 hour(s))  Respiratory Panel by RT PCR (Flu A&B, Covid) - Nasopharyngeal Swab     Status: None   Collection Time: 05/05/20  6:46 PM   Specimen: Nasopharyngeal Swab  Result Value Ref Range Status   SARS Coronavirus 2 by RT PCR NEGATIVE NEGATIVE Final    Comment: (NOTE) SARS-CoV-2 target nucleic acids are NOT DETECTED.  The SARS-CoV-2 RNA is generally detectable in upper respiratoy specimens during the acute phase of infection. The lowest concentration of SARS-CoV-2 viral copies this assay can detect is 131 copies/mL. A negative result does not preclude SARS-Cov-2 infection and should not be used as the sole basis for treatment or other patient management decisions. A negative result may occur with  improper specimen collection/handling, submission of specimen other than nasopharyngeal swab, presence of viral mutation(s) within the areas targeted by this assay, and inadequate number of viral copies (<131 copies/mL). A negative result must be combined with clinical observations, patient history, and epidemiological information. The expected result is Negative.  Fact Sheet for Patients:  https://www.moore.com/https://www.fda.gov/media/142436/download  Fact Sheet for Healthcare Providers:  https://www.young.biz/https://www.fda.gov/media/142435/download  This test is no t yet approved or cleared by the [place] FDA and  has been authorized for detection and/or diagnosis of SARS-CoV-2 by FDA under an Emergency Use Authorization (EUA). This EUA will remain  in effect (meaning this test can be used) for the duration of the COVID-19 declaration under Section 564(b)(1) of the Act, 21 U.S.C. section 360bbb-3(b)(1), unless the authorization is terminated  or revoked sooner.     Influenza A by PCR NEGATIVE NEGATIVE Final   Influenza B by PCR NEGATIVE NEGATIVE Final    Comment: (NOTE) The Xpert Xpress SARS-CoV-2/FLU/RSV assay is intended as an aid in  the diagnosis of influenza from Nasopharyngeal swab specimens and  should not be used as a sole basis for treatment. Nasal washings and  aspirates are unacceptable for Xpert Xpress SARS-CoV-2/FLU/RSV  testing.  Fact Sheet for Patients: https://www.moore.com/https://www.fda.gov/media/142436/download  Fact Sheet for Healthcare Providers: https://www.young.biz/https://www.fda.gov/media/142435/download  This test is  not yet approved or cleared by the [place] and  has been authorized for detection and/or diagnosis of SARS-CoV-2 by  FDA under an Emergency Use Authorization (EUA). This EUA will remain  in effect (meaning this test can be used) for the duration of the  Covid-19 declaration under Section 564(b)(1) of the Act, 21  U.S.C. section 360bbb-3(b)(1), unless the authorization is  terminated or revoked. Performed at [facility], [address], [place], [place] [address]   MRSA PCR Screening     Status: None   Collection Time: 05/05/20 11:56 PM   Specimen: Nasal Mucosa; Nasopharyngeal  Result Value Ref Range Status   MRSA by PCR NEGATIVE NEGATIVE Final    Comment:        The GeneXpert MRSA Assay (FDA approved for NASAL specimens only), is one component of a comprehensive MRSA colonization surveillance program. It is not intended to diagnose MRSA infection nor to guide or monitor treatment for MRSA infections. Performed at [facility], [address] [place], [place] [address]   Culture, blood (Routine X 2) w Reflex to ID Panel     Status: None   Collection Time: 05/07/20  7:59 PM   Specimen: BLOOD LEFT HAND  Result Value Ref Range Status   Specimen Description BLOOD LEFT HAND  Final   Special Requests   Final    BOTTLES DRAWN AEROBIC AND ANAEROBIC Blood Culture adequate volume   Culture    Final    NO GROWTH 5 DAYS Performed at [facility], [address] [place], [place] [address]    Report Status 05/12/2020 FINAL  Final  Culture, blood (Routine X 2) w Reflex to ID Panel     Status: None   Collection Time: 05/07/20  8:06 PM   Specimen: BLOOD RIGHT HAND  Result Value Ref Range Status   Specimen Description BLOOD RIGHT HAND  Final   Special Requests   Final    BOTTLES DRAWN AEROBIC AND ANAEROBIC Blood Culture adequate volume   Culture   Final    NO GROWTH 5 DAYS Performed at [facility], [address] [place], [place] [address]    Report Status 05/12/2020 FINAL  Final  Culture, blood (routine x 2)     Status: None   Collection Time: 05/09/20  8:27 AM   Specimen: BLOOD  Result Value Ref Range Status   Specimen Description BLOOD RIGHT ANTECUBITAL  Final   Special Requests   Final    AEROBIC BOTTLE ONLY Blood Culture results may not be optimal due to an inadequate volume of blood received in culture bottles   Culture   Final    NO GROWTH 5 DAYS Performed at [facility], [address], [place], [place] [address]    Report Status 05/14/2020 FINAL  Final  Culture, blood (routine x 2)     Status: None   Collection Time: 05/09/20  8:27 AM   Specimen: BLOOD  Result Value Ref Range Status   Specimen Description BLOOD RIGHT ANTECUBITAL  Final   Special Requests   Final    AEROBIC BOTTLE ONLY Blood Culture results may not be optimal due to an inadequate volume of blood received in culture bottles   Culture   Final    NO GROWTH 5 DAYS Performed at [facility], [address] [place], [place] [address]    Report Status 05/14/2020 FINAL  Final  Culture, Urine  Status: Abnormal   Collection Time: 05/09/20  8:40 AM   Specimen: Urine, Random  Result Value Ref Range Status   Specimen Description URINE, RANDOM  Final   Special Requests   Final    NONE Performed at [facility], [address] [place], [place] [address]    Culture  >=100,000 COLONIES/mL KLEBSIELLA PNEUMONIAE (A)  Final   Report Status 05/11/2020 FINAL  Final   Organism ID, Bacteria KLEBSIELLA PNEUMONIAE (A)  Final      Susceptibility   Klebsiella pneumoniae - MIC*    AMPICILLIN RESISTANT Resistant     CEFAZOLIN <=4 SENSITIVE Sensitive     CEFTRIAXONE <=0.25 SENSITIVE Sensitive     CIPROFLOXACIN <=0.25 SENSITIVE Sensitive     GENTAMICIN <=1 SENSITIVE Sensitive     IMIPENEM <=0.25 SENSITIVE Sensitive     NITROFURANTOIN 32 SENSITIVE Sensitive     TRIMETH/SULFA <=20 SENSITIVE Sensitive     AMPICILLIN/SULBACTAM 4 SENSITIVE Sensitive     PIP/TAZO <=4 SENSITIVE Sensitive     * >=100,000 COLONIES/mL KLEBSIELLA PNEUMONIAE     Rexene Alberts, MSN, NP-C [facility]  [email or web address] Pager: [phone] Office: [phone] [facility] Main Line: [phone]

## 2020-05-14 NOTE — Consult Note (Addendum)
Palliative Medicine Inpatient Consult Note  Reason for consult:  Goals of Care  HPI:  Per intake H&P --> Alan Warrenis an 64 y.o.malewith a PMHx of alcohol use and tobacco abuse who presented to the ED via EMS as a Code Stroke for acute onset of right sided weakness. he also had sensory loss and weakness involving his RUE and RLE. 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 Neurosurgery was consulted for EVD consideration which was then deferred.  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  Transferred under [facility] on 05/09/2020.  On 05/08/2020, patient spiked fever and he was started on broad-spectrum antibiotics/IV Zosyn empirically.  Despite of antibiotics, patient's white blood cells as well as fever continue to get worse with a T-max of 103.2 at around noon on 05/11/2020.  Previous hospitalist [name] had discussed case with ID who recommended scanning his chest and abdomen and continuing antibiotics.  CT abdomen chest and pelvis shows bibasilar infiltrates suspecting possible pneumonia but no other pathology. 2D EchoEF 60-65%. UDS positive for cocaine.  Palliative care was consulted to address goals of care in the setting of multiple comorbidities and an acute stroke.   Clinical Assessment/Goals of Care: I have reviewed medical records including EPIC notes, labs and imaging, received report from bedside RN, assessed the patient who was noted to just have had a bowel movement.  I assisted nursing to clean him up, he had notable black tarry stools.   I called patient's long-term partner Alan Everett and niece Alan Everett  to further discuss diagnosis prognosis, GOC, EOL wishes, disposition and options.  I introduced Palliative Medicine as specialized medical care for people living with serious illness. It focuses on providing relief from the symptoms and stress of a  serious illness. The goal is to improve quality of life for both the patient and the family.  I asked Alan [place]rett to tell me more about Alan [place].  [place] is from [place], [place].  She shares with me that they have been in a relationship for the past 45 years.  They do not share any children.  Alan Everett has nieces and nephews that he is very close to.  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  He is a man who loves fishing, gardening, and spending time with friends and family.  He is a spiritual man and ascribes to the [facility] faith.  Prior to [facility] hemorrhagic stroke he was fully independent of all ADLs.   I asked Alan Everett in [place] what they understood about his present health condition.  Alan Everett shares that she feels like he is not doing well though she remains hopeful for recovery in the setting of his stroke.  Alan Everett expresses that the medical team has told her that he has brain swelling secondary to the bleeding he is endured and it will be a test of time to see whether or not he makes functional improvements.   A detailed discussion was had today regarding advanced directives -there are none on file though patient's niece Alan Everett makes his healthcare decisions for him per his partner Alan Everett.    Concepts specific to code status, artifical feeding and hydration, continued IV antibiotics and rehospitalization was had.  Regarding cardiopulmonary resuscitation, Alan Everett states that she knows her uncle would want every effort made to maintain living inclusive of CPR, intubation, shocks, vasopressors,  antiarrhythmics, and transfer to the intensive care unit.  We discussed a time trial of treatments in other words if he were not improving after a week on mechanical ventilation and the medical team did not see him improving to then sensate from care.   We discussed my concerns for causing him more harm and overall burden than benefit with resuscitative efforts.  Alan Everett  shares that she has gone through this with other family members and is aware of the process.  She does want to be respectful of her uncle and give him every opportunity as she feels this is the most consistent with his personal wishes for himself.    We focused much of our conversation on the reality that Alan Everett continues to recurrently aspirate.  I shared with Alan Everett that even with a gastric tube placed we cannot alleviate the concern for continued aspiration given the amount of oral secretions produced on a daily basis.  I provided information that his issue is mechanical and though there may be some improvements in time I worry that they will not be significant enough to not cause recurrent pneumonias.  The difference between a aggressive medical intervention path and a palliative comfort care path for this patient at this time was had.  I was very candid with Alan Everett that if her uncle is not making relative improvements it would not be unreasonable at that point in time to further consider hospice care.  We further discussed that Clemon Everett will be followed by outpatient palliative support to continue these important conversations.  Values and goals of care important to patient and family were attempted to be elicited -patient most values his family.   Discussed the importance of continued conversation with family and their  medical providers regarding overall plan of care and treatment options, ensuring decisions are within the context of the patients values and GOCs.  Decision Maker:  SUMMARY OF RECOMMENDATIONS   Full Code / Full Scope of Care inclusive of G-Tube placement if need be  Appreciate neurology team's insights in providing patient's niece Alan Everett comprehensive updates on likelihood for recovery  TOC - OP Palliative Support  Ongoing palliative medicine support for goals of care conversations  Code Status/Advance Care Planning: FULL CODE   Palliative Prophylaxis:   Oral care,  range of motion, aspiration precautions, delirium precautions  Additional Recommendations (Limitations, Scope, Preferences):  Continue current scope of care  Psycho-social/Spiritual:   Desire for further Chaplaincy support:  Yes patient is [facility]  Additional Recommendations:  Education on chronic illness hemorrhagic stroke in hospice care   Prognosis:  Unclear though given patient's functional deficits 72-month mortality risk is high  Discharge Planning:  Discharged to skilled nursing when medically optimized with outpatient palliative support  PPS: 10%   This conversation/these recommendations were discussed with patient primary care team, Dr. Jacqulyn Bath  Time In: 1140 Time Out: 1250 Total Time: 70 Greater than 50%  of this time was spent counseling and coordinating care related to the above assessment and plan.  Lamarr Lulas [facility] Palliative Medicine Team Team Cell Phone: [phone] Please utilize secure chat with additional questions, if there is no response within 30 minutes please call the above phone number  Palliative Medicine Team providers are available by phone from 7am to 7pm daily and can be reached through the team cell phone.  Should this patient require assistance outside of these hours, please call the patient's attending physician.

## 2020-05-14 NOTE — Progress Notes (Signed)
  Speech Language Pathology Treatment: Dysphagia;Cognitive-Linquistic  Patient Details Name: Alan Everett MRN: [id number] DOB: 04-30-1956 Today's Date: 05/14/2020 Time: 1425-1450 SLP Time Calculation (min) (ACUTE ONLY): 25 min  Assessment / Plan / Recommendation Clinical Impression  Pt was lethargic upon therapist's arrival but awakened easily to voice and light touch.  Therapist facilitated the session with trials of thin liquids via straw siphon following oral care.  Pt accepted straw and was sporadically able to close his lips around straw; however, he was unable to maintain labial seal to contain bolus once straw was removed which, in combination with positioning and gravity (head remains in a fixed, flexed downward left position), lead to complete loss of each bolus despite max to total assist multimodal cues.  Pt was unable to vocalize on command despite max assist multimodal cues.  Pt's niece was present and had questions regarding treatment plan and goals.  SLP provided skilled education regarding current treatment focus being on prefunctional communication and swallowing skills such as vocalizing on command, moving articulators on command, acceptance of boluses, oral hygiene, etc.  All questions were answered to her satisfaction at this time.  Pt was left in bed with bed alarm set and call bell within reach.  Continue per current plan of care.    HPI HPI: Alan Everett is a 64 y.o. male with a PMHx of alcohol use and tobacco abuse who presents acutely to the ED via EMS as a Code Stroke for acute onset of right sided weakness. Patient with disoriented, right leg weakness, right limb ataxia, right decreased sensation and Global aphasia on exam. 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 Worsening of intraventricular      SLP Plan  Continue with current plan of care       Recommendations  Diet recommendations: NPO Medication Administration: Via alternative means                 Oral Care Recommendations: Oral care QID Follow up Recommendations: Inpatient Rehab SLP Visit Diagnosis: Dysphagia, unspecified (R13.10) Plan: Continue with current plan of care       GO                PageMelanee Everett 05/14/2020, 2:53 PM

## 2020-05-14 NOTE — Progress Notes (Signed)
Nutrition Follow-up  DOCUMENTATION CODES:   Not applicable  INTERVENTION:  Continue Tube Feeding via Cortrak: Osmolite 1.2 at 65 ml/hr Flush with 134m free water Q6H Provides 87 g of protein, 1872 kcals and 1264 mL of free water (18620mtotal free water with flushes) Meets 100% estimated calorie and protein needs   NUTRITION DIAGNOSIS:   Inadequate oral intake related to dysphagia, acute illness as evidenced by NPO status.  Ongoing  GOAL:   Patient will meet greater than or equal to 90% of their needs  Met with TF  MONITOR:   TF tolerance, Diet advancement, Weight trends, Skin  REASON FOR ASSESSMENT:   Consult, New TF Enteral/tube feeding initiation and management  ASSESSMENT:   89o male presents with acute onset of right sided weakness and mixed recept and expressive aphasia and admitted with acute left thalamocapsular ICH, severe HTN. PMH includes EtOH and tobacco abuse  10/06 Admitted 10/08 Cortrak placed (gastric) 10/13 repeat CT head showed stable L thalamic hemorrhage and intraventricular extension since 05/08/2020.  Regional edema, mild regional mass-effect and mild lateral ventriculomegaly and trace SAH.  GI consulted given concern of positive FOBT and drop in hgb. GI recommending cross-sectional imaging to exclude large GI lesions within the small bowel. If pt has progressive anemia, transfusion dependent or requirements, GI will consider an EGD with VCE placement given that pt is unable to swallow. GI believe pt could have small bowel pathology and could be having some angiectasias that have been bleeding.  Pt's speech remains incomprehensible and pt continues to receive TF via Cortrak. Current orders are for Osmolite 1.2 at 65 ml/hr. Provides 87 g of protein, 1872 kcals and 1264 mL of free water. Meets 100% estimated calorie and protein needs. Will continue with current nutrition plan of care. Noted that PMT has been consulted; will monitor for results of [place].   Will add FWF given hypernatremia.  Admit wt: 65.7 kg Current wt: 64.7 kg  UOP: 167533m24 hours I/O: +28,1ml52mnce admit  Labs: Na 147 (H), CBGs 151-156  Medications: ss novolog, 10 units lantus, flagyl, protonix, senokot-s, IV abx  Diet Order:   Diet Order            Diet NPO time specified  Diet effective now                 EDUCATION NEEDS:   Not appropriate for education at this time  Skin:  Skin Assessment: Reviewed RN Assessment  Last BM:  10/14 type 7  Height:   Ht Readings from Last 1 Encounters:  04/09/20 5' 6"  (1.676 m)    Weight:   Wt Readings from Last 1 Encounters:  05/14/20 64.7 kg    BMI:  Body mass index is 23.02 kg/m.  Estimated Nutritional Needs:   Kcal:  1780-1980 kcals  Protein:  85-95 g  Fluid:  >/= 1.8 L    AmanLarkin Ina, RD, LDN RD pager number and weekend/on-call pager number located in [place]

## 2020-05-14 NOTE — Progress Notes (Signed)
Physical Therapy Treatment Patient Details Name: Alan Everett MRN: [id number] DOB: 1956/07/22 Today's Date: 05/14/2020    History of Present Illness Patient is a 64 y/o male with PMH ETOH use, tobacco abuse, admitted with R side weakness,sensory loss and speech deficits.  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    PT Comments    Patient continues to demonstrate lethargy, left gaze preference and right hemiplegia. Worked on rolling to right/left x5 with little initiation. Pt non verbal today and not following any commands. Worked on stretching of cervical spine musculature and better positioning in bed into neutral. Will continue to follow and progress.    Follow Up Recommendations  SNF;Supervision/Assistance - 24 hour     Equipment Recommendations  Wheelchair cushion (measurements PT);Wheelchair (measurements PT);Hospital bed;Other (comment)    Recommendations for Other Services       Precautions / Restrictions Precautions Precautions: Fall Precaution Comments: soft cuff restraint L wrist, pushes to Rt, coretrack, BP <180 Restrictions Weight Bearing Restrictions: No    Mobility  Bed Mobility Overal bed mobility: Needs Assistance Bed Mobility: Rolling Rolling: +2 for physical assistance;Total assist         General bed mobility comments: Pt sitting on stool upon arrival. Rolling to right/left x5 for pericare and to change sheets. Little initiation however once on side able to help pull with LUE onto side.  Transfers                 General transfer comment: Deferred due to arousal/safety.  Ambulation/Gait                 Stairs             Wheelchair Mobility    Modified Rankin (Stroke Patients Only) Modified Rankin (Stroke Patients Only) Pre-Morbid Rankin Score: No symptoms Modified Rankin: Severe disability     Balance                                             Cognition Arousal/Alertness: Lethargic Behavior During Therapy: Flat affect Overall Cognitive Status: Difficult to assess                                 General Comments: no command following today, gaze preference to Left      Exercises      General Comments General comments (skin integrity, edema, etc.): Repositioned cervical spine into neutral and removed pillow for better positioning.      Pertinent Vitals/Pain Pain Assessment: Faces Faces Pain Scale: Hurts little more Pain Location: cervical AROM Pain Descriptors / Indicators: Grimacing Pain Intervention(s): Monitored during session;Repositioned    Home Living                      Prior Function            PT Goals (current goals can now be found in the care plan section) Progress towards PT goals: Not progressing toward goals - comment (lethargy/cognition)    Frequency    Min 3X/week      PT Plan Current plan remains appropriate    Co-evaluation              AM-PAC PT "6 Clicks" Mobility   Outcome Measure  Help needed turning  from your back to your side while in a flat bed without using bedrails?: Total Help needed moving from lying on your back to sitting on the side of a flat bed without using bedrails?: Total Help needed moving to and from a bed to a chair (including a wheelchair)?: Total Help needed standing up from a chair using your arms (e.g., wheelchair or bedside chair)?: Total Help needed to walk in hospital room?: Total Help needed climbing 3-5 steps with a railing? : Total 6 Click Score: 6    End of Session   Activity Tolerance: Patient limited by lethargy Patient left: in bed;with call bell/phone within reach;with bed alarm set;with SCD's reapplied;with restraints reapplied Nurse Communication: Mobility status;Need for lift equipment PT Visit Diagnosis: Unsteadiness on feet (R26.81);Muscle weakness (generalized) (M62.81);Difficulty in walking, not  elsewhere classified (R26.2);Other symptoms and signs involving the nervous system (R29.898);Hemiplegia and hemiparesis Hemiplegia - Right/Left: Right Hemiplegia - dominant/non-dominant: Dominant Hemiplegia - caused by: Nontraumatic intracerebral hemorrhage     Time: 1200-1216 PT Time Calculation (min) (ACUTE ONLY): 16 min  Charges:  $Therapeutic Activity: 8-22 mins                     [name], PT, DPT Acute Rehabilitation Services Pager [phone] Office [phone]       Blake Divine A Lanier Ensign 05/14/2020, 3:25 PM

## 2020-05-15 LAB — BASIC METABOLIC PANEL
Anion gap: 11 (ref 5–15)
BUN: 35 mg/dL — ABNORMAL HIGH (ref 8–23)
CO2: 20 mmol/L — ABNORMAL LOW (ref 22–32)
Calcium: 9.1 mg/dL (ref 8.9–10.3)
Chloride: 122 mmol/L — ABNORMAL HIGH (ref 98–111)
Creatinine, Ser: 1.25 mg/dL — ABNORMAL HIGH (ref 0.61–1.24)
GFR, Estimated: >60 mL/min (ref >60)
Glucose, Bld: 171 mg/dL — ABNORMAL HIGH (ref 70–99)
Potassium: 4.2 mmol/L (ref 3.5–5.1)
Sodium: 153 mmol/L — ABNORMAL HIGH (ref 135–145)

## 2020-05-15 LAB — CBC WITH DIFFERENTIAL/PLATELET
Abs Immature Granulocytes: 2.07 10*3/uL — ABNORMAL HIGH (ref 0.00–0.07)
Basophils Absolute: 0.1 10*3/uL (ref 0.0–0.1)
Basophils Relative: 0 %
Eosinophils Absolute: 0.7 10*3/uL — ABNORMAL HIGH (ref 0.0–0.5)
Eosinophils Relative: 2 %
HCT: 26.3 % — ABNORMAL LOW (ref 39.0–52.0)
Hemoglobin: 8.3 g/dL — ABNORMAL LOW (ref 13.0–17.0)
Immature Granulocytes: 7 %
Lymphocytes Relative: 7 %
Lymphs Abs: 2 10*3/uL (ref 0.7–4.0)
MCH: 29.4 pg (ref 26.0–34.0)
MCHC: 31.6 g/dL (ref 30.0–36.0)
MCV: 93.3 fL (ref 80.0–100.0)
Monocytes Absolute: 2.6 10*3/uL — ABNORMAL HIGH (ref 0.1–1.0)
Monocytes Relative: 9 %
Neutro Abs: 21.7 10*3/uL — ABNORMAL HIGH (ref 1.7–7.7)
Neutrophils Relative %: 75 %
Platelets: 287 10*3/uL (ref 150–400)
RBC: 2.82 MIL/uL — ABNORMAL LOW (ref 4.22–5.81)
RDW: 19.2 % — ABNORMAL HIGH (ref 11.5–15.5)
WBC: 29.2 10*3/uL — ABNORMAL HIGH (ref 4.0–10.5)
nRBC: 0.2 % (ref 0.0–0.2)

## 2020-05-15 LAB — HCV RT-PCR, QUANT (NON-GRAPH)
HCV log10: 5.899 log10 IU/mL
Hepatitis C Quantitation: 792000 IU/mL

## 2020-05-15 LAB — GLUCOSE, CAPILLARY
Glucose-Capillary: 153 mg/dL — ABNORMAL HIGH (ref 70–99)
Glucose-Capillary: 166 mg/dL — ABNORMAL HIGH (ref 70–99)
Glucose-Capillary: 155 mg/dL — ABNORMAL HIGH (ref 70–99)
Glucose-Capillary: 103 mg/dL — ABNORMAL HIGH (ref 70–99)

## 2020-05-15 LAB — HCV AB W REFLEX TO QUANT PCR: HCV Ab: >11 s/co ratio — ABNORMAL HIGH (ref 0.0–0.9)

## 2020-05-15 MED ORDER — SODIUM CHLORIDE 0.9 % IV SOLN: INTRAVENOUS | Status: AC

## 2020-05-15 NOTE — Progress Notes (Signed)
PROGRESS NOTE    Alan Everett  LGX:[id number] DOB: 07-Aug-1955 DOA: 05/05/2020 PCP: Patient, No Pcp Per   Brief Narrative:  HPI per [name] on 05/05/20 Alan Everett is an 64 y.o. male with a PMHx of alcohol use and tobacco abuse who presented to the ED via EMS as a Code Stroke for acute onset of right sided weakness. he also had sensory loss and weakness involving his RUE and RLE. 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 Neurosurgery was consulted for EVD consideration which was then deferred.  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  Transferred under [facility] on 05/09/2020.  On 05/08/2020, patient spiked fever and he was started on broad-spectrum antibiotics/IV Zosyn empirically.  Despite of antibiotics, patient's white blood cells as well as fever continue to get worse with a T-max of 103.2 at around noon on 05/11/2020.  Previous hospitalist [name] had discussed case with ID who recommended scanning his chest and abdomen and continuing antibiotics.  CT abdomen chest and pelvis shows bibasilar infiltrates suspecting possible pneumonia but no other pathology. 2D Echo EF 60-65%. UDS positive for cocaine  CT head: 1. 2.3 x 2.1 x 2.9 cm acute intraparenchymal hemorrhage centered at the left thalamic capsular region, estimated volume 7 cc. Minimal surrounding vasogenic edema without significant regional mass effect or midline shift. Associated intraventricular extension with small volume intraventricular blood within the adjacent left lateral ventricle. No hydrocephalus or ventricular trapping. 2. No other acute intracranial abnormality. 3. Underlying age-related cerebral atrophy with mild chronic small vessel ischemic disease.    His oral iron was stopped yesterday and GI was consulted given concern of positive FOBT and drop in hemoglobin.  He is at high risk for  getting an EGD in the setting of his acute CVA and lack of overt bleeding.  They are recommending cross-sectional imaging to exclude large GI lesions within the small bowel.  They recommend if patient has progressive anemia, transfusion dependent or requirements they will consider an EGD with VCE placement given that he is unable to swallow.  [name] believes that he could have small bowel pathology and could be having some angiectasia's that have been bleeding given that he had recently had an EGD and colon a few months ago.  Assessment & Plan:   Active Problems:   ICH (intracerebral hemorrhage) (HCC)   SIRS (systemic inflammatory response syndrome) (HCC)   Polysubstance abuse (HCC)   Dysphagia, post-stroke   Hyperglycemia   Hypokalemia   Hypomagnesemia   Fever   SOB (shortness of breath)   Leukocytosis   Aspiration pneumonia of both lower lobes (HCC)   Palliative care by specialist   Goals of care, counseling/discussion   DNR (do not resuscitate) discussion  Acute Hemorrhagic Stroke/cerebral edema - L BG ICH w/ IVH d/t severe hypertension in setting of alcohol and cocaine use -Code Stroke CT head 10/6 showed L thalamocapsular IPH 7cc w/ minimal edema, mild IVH. Small vessel disease. Atrophy.  -CT head 10/6 2042 enlarging L thalamocapsular IPH w/ increasing IVH from L ventricle into 3rd and 4th ventricles. -CT head 10/7 0510 stable L thalamic IPH w/ IVH w/ clearing of blood in 4th ventricle.  -CTA head and neck 10/8 unremarkable angio, slight increase in midline shift and ventriculomegaly -MRI not able to perform due to no family contact to clear for metal -Repeat CT head 10/9 suboptimal but stable -2D Echo  EF 60-65% -UDS positive for cocaine -No antithrombotic prior to admission, now on No antithrombotic given IPH -Therapy recommendations:  CIR vs SNF and will go to CIR once more medically stable Neurology felt that his exam is essentially unchanged and recommending CIR when  medically stable for discharge.  Repeat CT head on 05/12/2020 essentially showed stable left thalamic hemorrhage and intraventricular extension since 05/08/2020.  Regional edema, mild regional mass-effect and mild lateral ventriculomegaly and trace SAH.  Neurology signed off on 05/14/2020.  Hypertensive Emergency -Per EMS, SBP > 200  -Home meds:  none -Weaned off off Cleviprex. -Now On Amlodipine 10 and lisinopril 20 bid and as needed IV hydralazine.  Blood pressure mostly controlled with intermittent tachycardia and thus Lopressor was added which I will continue. -SBP goal < 160 -Long-term BP goal normotensive;  Hyperlipidemia -Home meds:  None -LDL 80, goal < 70 -No statin in setting of IPH -Mild transaminitis, likely related to alcohol, AST/ALT 68/64 ->52/47 -> 35/45 -> and is Now worsening and is 90/78 respectively -Will Consider statin low dose on discharge with normalized liver enzymes and defer to Neurology to initiate   Dysphagia -Secondary to stroke -C/w NPO; SLP on board.  Patient on tube feedings at 65 mL. cortrak in place. SLP managing. On IVF with NS at 30 mL/hr which will now be discontinued   Tobacco Abuse -Patient is a current smoker and will need smoking cessation counseling given once he is more awake  Cocaine abuse -UDS positive for cocaine -Cocaine cessation will be provided when he is more awake -Avoid beta-blocker  Hypophosphatemia Resolved.  Hypokalemia Resolved.  Normocytic Anemia -Patient's Hgb/Hct trending down from 12.0/38.3 -> 11.9/37.9 -> 10.7/34.1 and is trending down to 9.5/30.6 -> 8.7/28.4> 6.8/22.1 received 2 units of PRBC on 05/12/2020> 10.4/32.5. -He is having black stools so iron supplementation was stopped.  FOBT positive.  -Gastroenterology were consulted and recently had an EGD and colonoscopy over the last 2 months.  [name] recommends next up in evaluating would be a video capsule endoscopy but it cannot be formed due to his recent  CVA and dysphagia and since his inability swallowed it would have to be done endoscopically and placed an EGD.  Currently endoscopic procedures are high risk with his recent acute CVA and anything small bowel pathology would require a deep enteroscopy or may not be reachable via endoscopically and likely the pathology would be intestinal angiectasia's.  [name] recommends continue to monitor carefully. 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  If recurrent transfusion needed, will call GI back.  SIRS with Leukocytosis and Fever in the setting of Aspiration Pneumonia, leukocytosis was worsening up until yesterday.  -Likely in the setting of aspiration pneumonia. -CT chest shows bibasilar pneumonia.  CT abdomen pelvis negative.  Culture negative so far.  Last temperature 100.9 at 11 AM on 05/12/2020.  Afebrile since then.  ID thinks that his fever is coming from ICH.  However they added Flagyl to his current regime of Rocephin and Zithromax on 05/13/2020.  His white cells improved on 05/14/2020 but stable today.  Fortunately, he has remained afebrile for last 2 to 3 days now.  GERD -C/w Pantorpazole 40 mg per Tube Daily qHS and will increase to twice daily   Hypernatremia/hyperchloremia -Mild hyperchloremia but hyponatremia resolved.  Thrombocytopenia Resolved.  Hyperglycemia  -Likely reactive on tube feedings and his  hemoglobin A1c is 4.6 Will start on Lantus 10 units.  DVT prophylaxis: SCDs Code Status: FULL CODE Family Communication: No family present at bedside.  Discussed with his wife over the phone day before yesterday.  She wants to continue full code.  Palliative care in communication with family. Disposition Plan: Need further clinical improvement back to baseline and anticipate discharge to CIR versus SNF once medically stable  Status is:  Inpatient  Remains inpatient appropriate because:Altered mental status, Unsafe d/c plan, IV treatments appropriate due to intensity of illness or inability to take PO and Inpatient level of care appropriate due to severity of illness   Dispo: The patient is from: Home              Anticipated d/c is to: CIR vs. SNF              Anticipated d/c date is: 3 days              Patient currently is not medically stable to d/c.  Consultants:   Neurology  Gastroenterology  Case was discussed with infectious diseases Dr. Daiva Eves  Procedures:  ECHOCARDIOGRAM IMPRESSIONS    1. Left ventricular ejection fraction, by estimation, is 60 to 65%. The  left ventricle has normal function. The left ventricle has no regional  wall motion abnormalities. Left ventricular diastolic parameters are  consistent with Grade I diastolic  dysfunction (impaired relaxation).  2. Right ventricular systolic function is normal. The right ventricular  size is normal. Tricuspid regurgitation signal is inadequate for assessing  PA pressure.  3. The mitral valve is normal in structure. No evidence of mitral valve  regurgitation. No evidence of mitral stenosis.  4. The aortic valve is tricuspid. Aortic valve regurgitation is not  visualized. No aortic stenosis is present.  5. The inferior vena cava is normal in size with greater than 50%  respiratory variability, suggesting right atrial pressure of 3 mmHg.   FINDINGS  Left Ventricle: Left ventricular ejection fraction, by estimation, is 60  to 65%. The left ventricle has normal function. The left ventricle has no  regional wall motion abnormalities. The left ventricular internal cavity  size was normal in size. There is  no left ventricular hypertrophy. Left ventricular diastolic parameters  are consistent with Grade I diastolic dysfunction (impaired relaxation).   Right Ventricle: The right ventricular size is normal. No increase in  right ventricular  wall thickness. Right ventricular systolic function is  normal. Tricuspid regurgitation signal is inadequate for assessing PA  pressure.   Left Atrium: Left atrial size was normal in size.   Right Atrium: Right atrial size was normal in size.   Pericardium: There is no evidence of pericardial effusion.   Mitral Valve: The mitral valve is normal in structure. No evidence of  mitral valve regurgitation. No evidence of mitral valve stenosis.   Tricuspid Valve: The tricuspid valve is normal in structure. Tricuspid  valve regurgitation is not demonstrated.   Aortic Valve: The aortic valve is tricuspid. Aortic valve regurgitation is  not visualized. No aortic stenosis is present.   Pulmonic Valve: The pulmonic valve was normal in structure. Pulmonic valve  regurgitation is not visualized.   Aorta: The aortic root is normal in size and structure.   Venous: The inferior vena cava is normal in size with greater than 50%  respiratory variability, suggesting right atrial pressure of 3 mmHg.   IAS/Shunts: No atrial level shunt detected by color flow Doppler.  LEFT VENTRICLE  PLAX 2D  LVIDd:     4.30 cm Diastology  LVIDs:     2.70 cm LV e' medial:  8.49 cm/s  LV PW:     1.00 cm LV E/e' medial: 9.2  LV IVS:    0.90 cm LV e' lateral:  7.46 cm/s  LVOT diam:   2.20 cm LV E/e' lateral: 10.4  LV SV:     98  LV SV Index:  57  LVOT Area:   3.80 cm     RIGHT VENTRICLE  RV S prime:   13.90 cm/s  TAPSE (M-mode): 2.0 cm   LEFT ATRIUM       Index    RIGHT ATRIUM      Index  LA diam:    3.10 cm 1.78 cm/m RA Area:   11.60 cm  LA Vol (A2C):  40.9 ml 23.49 ml/m RA Volume:  24.60 ml 14.13 ml/m  LA Vol (A4C):  37.9 ml 21.77 ml/m  LA Biplane Vol: 43.0 ml 24.69 ml/m  AORTIC VALVE  LVOT Vmax:  120.00 cm/s  LVOT Vmean: 75.400 cm/s  LVOT VTI:  0.259 m    AORTA  Ao Root diam: 3.20 cm   MITRAL VALVE  MV Area (PHT):  2.80 cm  SHUNTS  MV Decel Time: 271 msec  Systemic VTI: 0.26 m  MV E velocity: 77.70 cm/s Systemic Diam: 2.20 cm  MV A velocity: 84.50 cm/s  MV E/A ratio: 0.92   Antimicrobials:  Anti-infectives (From admission, onward)   Start     Dose/Rate Route Frequency Ordered Stop   05/14/20 1000  cefTRIAXone (ROCEPHIN) 2 g in sodium chloride 0.9 % 100 mL IVPB        2 g 200 mL/hr over 30 Minutes Intravenous Daily 05/13/20 1454 05/15/20 1255   05/13/20 2200  metroNIDAZOLE (FLAGYL) tablet 500 mg        500 mg Per Tube Every 8 hours 05/13/20 1847 05/15/20 2359   05/13/20 1400  metroNIDAZOLE (FLAGYL) tablet 500 mg  Status:  Discontinued        500 mg Oral Every 8 hours 05/13/20 0935 05/13/20 1847   05/12/20 0900  cefTRIAXone (ROCEPHIN) 1 g in sodium chloride 0.9 % 100 mL IVPB  Status:  Discontinued        1 g 200 mL/hr over 30 Minutes Intravenous Daily 05/12/20 0817 05/13/20 1454   05/12/20 0900  azithromycin (ZITHROMAX) 500 mg in sodium chloride 0.9 % 250 mL IVPB  Status:  Discontinued        500 mg 250 mL/hr over 60 Minutes Intravenous Daily 05/12/20 0817 05/13/20 0935   05/09/20 0930  Ampicillin-Sulbactam (UNASYN) 3 g in sodium chloride 0.9 % 100 mL IVPB  Status:  Discontinued        3 g 200 mL/hr over 30 Minutes Intravenous Every 8 hours 05/09/20 0837 05/09/20 0839   05/09/20 0930  Ampicillin-Sulbactam (UNASYN) 3 g in sodium chloride 0.9 % 100 mL IVPB  Status:  Discontinued        3 g 200 mL/hr over 30 Minutes Intravenous Every 6 hours 05/09/20 0839 05/12/20 0817        Subjective: Seen and examined.  Status quo.  Does not talk at all.  Does not follow any commands.  Remains encephalopathic.  Objective: Vitals:   05/15/20 0000 05/15/20 0728 05/15/20 0803 05/15/20 1110  BP: 131/62 (!) 114/56  105/67  Pulse: 99 (!) 102  96  Resp: Temp:  99.5 F (37.5 C) 98.2 F (36.8 C)  98.5 F (36.9 C)  TempSrc: Axillary   Oral  SpO2: 98% 100% 100% 100%  Weight:         Intake/Output Summary (Last 24 hours) at 05/15/2020 1406 Last data filed at 05/15/2020 0451 Gross per 24 hour  Intake --  Output 550 ml  Net -550 ml   Filed Weights   05/11/20 0331 05/12/20 0434 05/14/20 0500  Weight: 61.4 kg 62.5 kg 64.7 kg   Examination: Physical Exam:  General exam: Appears calm and comfortable  Respiratory system: Clear to auscultation. Respiratory effort normal. Cardiovascular system: S1 & S2 heard, RRR. No JVD, murmurs, rubs, gallops or clicks. No pedal edema. Gastrointestinal system: Abdomen is nondistended, soft and nontender. No organomegaly or masses felt. Normal bowel sounds heard. Central nervous system: Alert and oriented x0. \Decreased power in left side of the body and right lower extremity.  0 power in right upper extremity.  Data Reviewed: I have personally reviewed following labs and imaging studies  CBC: Recent Labs  Lab 05/11/20 0637 05/11/20 0637 05/12/20 0510 05/12/20 1911 05/13/20 1009 05/14/20 0117 05/15/20 0354  WBC 29.3*  --  31.9*  --  33.5* 29.2* 29.2*  NEUTROABS 21.9*  --  25.3*  --  24.8* 21.7* 21.7*  HGB 8.7*   < > 6.8* 10.4* 8.8* 8.8* 8.3*  HCT 28.4*   < > 22.1* 32.5* 27.5* 27.6* 26.3*  MCV 93.7  --  93.6  --  89.6 91.4 93.3  PLT 177  --  189  --  232 252 287   < > = values in this interval not displayed.   Basic Metabolic Panel: Recent Labs  Lab 05/08/20 1736 05/09/20 0500 05/09/20 0827 05/10/20 0200 05/10/20 0200 05/11/20 2130 05/12/20 0510 05/13/20 1009 05/14/20 0117 05/15/20 0354  NA  --    < >  --  142   < > 146* 145 149* 147* 153*  K  --    < >  --  3.4*   < > 4.1 3.8 4.0 4.0 4.2  CL  --    < >  --  110   < > 116* 115* 118* 119* 122*  CO2  --    < >  --  23   < > 19* 22 19* 21* 20*  GLUCOSE  --    < >  --  146*   < > 157* 205* 154* 169* 171*  BUN  --    < >  --  24*   < > 21 25* 26* 31* 35*  CREATININE  --    < >  --  0.90   < > 0.91 1.08 1.08 1.08 1.25*  CALCIUM  --    < >  --  8.5*   < > 8.9  8.5* 8.7* 9.0 9.1  MG 1.9  --  2.2 2.3  --  2.1 2.3 2.4  --   --   PHOS 1.5*  --  2.0* 2.6  --  3.0 3.1  --   --   --    < > = values in this interval not displayed.   GFR: Estimated Creatinine Clearance: 53.9 mL/min (A) (by C-G formula based on SCr of 1.25 mg/dL (H)). Liver Function Tests: Recent Labs  Lab 05/09/20 0827 05/10/20 0200 05/11/20 0637 05/12/20 0510  AST 35 39 90* 118*  ALT 45* 40 78* 86*  ALKPHOS 70 63 71 58  BILITOT 0.5 0.2* 0.7  0.7  PROT 7.5 7.1 7.8 6.7  ALBUMIN 2.8* 2.5* 2.6* 2.2*   No results for input(s): LIPASE, AMYLASE in the last 168 hours. No results for input(s): AMMONIA in the last 168 hours. Coagulation Profile: No results for input(s): INR, PROTIME in the last 168 hours. Cardiac Enzymes: No results for input(s): CKTOTAL, CKMB, CKMBINDEX, TROPONINI in the last 168 hours. BNP (last 3 results) No results for input(s): PROBNP in the last 8760 hours. HbA1C: No results for input(s): HGBA1C in the last 72 hours. CBG: Recent Labs  Lab 05/14/20 1530 05/14/20 2007 05/14/20 2303 05/15/20 0429 05/15/20 1031  GLUCAP 157* 145* 159* 153* 166*   Lipid Profile: No results for input(s): CHOL, HDL, LDLCALC, TRIG, CHOLHDL, LDLDIRECT in the last 72 hours. Thyroid Function Tests: No results for input(s): TSH, T4TOTAL, FREET4, T3FREE, THYROIDAB in the last 72 hours. Anemia Panel: No results for input(s): VITAMINB12, FOLATE, FERRITIN, TIBC, IRON, RETICCTPCT in the last 72 hours. Sepsis Labs: Recent Labs  Lab 05/12/20 0510  PROCALCITON 0.21    Recent Results (from the past 240 hour(s))  Respiratory Panel by RT PCR (Flu A&B, Covid) - Nasopharyngeal Swab     Status: None   Collection Time: 05/05/20  6:46 PM   Specimen: Nasopharyngeal Swab  Result Value Ref Range Status   SARS Coronavirus 2 by RT PCR NEGATIVE NEGATIVE Final    Comment: (NOTE) SARS-CoV-2 target nucleic acids are NOT DETECTED.  The SARS-CoV-2 RNA is generally detectable in upper  respiratoy specimens during the acute phase of infection. The lowest concentration of SARS-CoV-2 viral copies this assay can detect is 131 copies/mL. A negative result does not preclude SARS-Cov-2 infection and should not be used as the sole basis for treatment or other patient management decisions. A negative result may occur with  improper specimen collection/handling, submission of specimen other than nasopharyngeal swab, presence of viral mutation(s) within the areas targeted by this assay, and inadequate number of viral copies (<131 copies/mL). A negative result must be combined with clinical observations, patient history, and epidemiological information. The expected result is Negative.  Fact Sheet for Patients:  https://www.moore.com/  Fact Sheet for Healthcare Providers:  https://www.young.biz/  This test is no t yet approved or cleared by the [place] FDA and  has been authorized for detection and/or diagnosis of SARS-CoV-2 by FDA under an Emergency Use Authorization (EUA). This EUA will remain  in effect (meaning this test can be used) for the duration of the COVID-19 declaration under Section 564(b)(1) of the Act, 21 U.S.C. section 360bbb-3(b)(1), unless the authorization is terminated or revoked sooner.     Influenza A by PCR NEGATIVE NEGATIVE Final   Influenza B by PCR NEGATIVE NEGATIVE Final    Comment: (NOTE) The Xpert Xpress SARS-CoV-2/FLU/RSV assay is intended as an aid in  the diagnosis of influenza from Nasopharyngeal swab specimens and  should not be used as a sole basis for treatment. Nasal washings and  aspirates are unacceptable for Xpert Xpress SARS-CoV-2/FLU/RSV  testing.  Fact Sheet for Patients: https://www.moore.com/  Fact Sheet for Healthcare Providers: https://www.young.biz/  This test is not yet approved or cleared by the [place] FDA and  has been  authorized for detection and/or diagnosis of SARS-CoV-2 by  FDA under an Emergency Use Authorization (EUA). This EUA will remain  in effect (meaning this test can be used) for the duration of the  Covid-19 declaration under Section 564(b)(1) of the Act, 21  U.S.C. section 360bbb-3(b)(1), unless the authorization is  terminated or  revoked. Performed at [facility], [address] [place], [place] [address]   MRSA PCR Screening     Status: None   Collection Time: 05/05/20 11:56 PM   Specimen: Nasal Mucosa; Nasopharyngeal  Result Value Ref Range Status   MRSA by PCR NEGATIVE NEGATIVE Final    Comment:        The GeneXpert MRSA Assay (FDA approved for NASAL specimens only), is one component of a comprehensive MRSA colonization surveillance program. It is not intended to diagnose MRSA infection nor to guide or monitor treatment for MRSA infections. Performed at [facility], [address] [place], [place] [address]   Culture, blood (Routine X 2) w Reflex to ID Panel     Status: None   Collection Time: 05/07/20  7:59 PM   Specimen: BLOOD LEFT HAND  Result Value Ref Range Status   Specimen Description BLOOD LEFT HAND  Final   Special Requests   Final    BOTTLES DRAWN AEROBIC AND ANAEROBIC Blood Culture adequate volume   Culture   Final    NO GROWTH 5 DAYS Performed at [facility], [address] [place], [place] [address]    Report Status 05/12/2020 FINAL  Final  Culture, blood (Routine X 2) w Reflex to ID Panel     Status: None   Collection Time: 05/07/20  8:06 PM   Specimen: BLOOD RIGHT HAND  Result Value Ref Range Status   Specimen Description BLOOD RIGHT HAND  Final   Special Requests   Final    BOTTLES DRAWN AEROBIC AND ANAEROBIC Blood Culture adequate volume   Culture   Final    NO GROWTH 5 DAYS Performed at [facility], [address] [place], [place] [address]    Report Status 05/12/2020 FINAL  Final  Culture, blood (routine x 2)      Status: None   Collection Time: 05/09/20  8:27 AM   Specimen: BLOOD  Result Value Ref Range Status   Specimen Description BLOOD RIGHT ANTECUBITAL  Final   Special Requests   Final    AEROBIC BOTTLE ONLY Blood Culture results may not be optimal due to an inadequate volume of blood received in culture bottles   Culture   Final    NO GROWTH 5 DAYS Performed at [facility], [address] [place], [place] [address]    Report Status 05/14/2020 FINAL  Final  Culture, blood (routine x 2)     Status: None   Collection Time: 05/09/20  8:27 AM   Specimen: BLOOD  Result Value Ref Range Status   Specimen Description BLOOD RIGHT ANTECUBITAL  Final   Special Requests   Final    AEROBIC BOTTLE ONLY Blood Culture results may not be optimal due to an inadequate volume of blood received in culture bottles   Culture   Final    NO GROWTH 5 DAYS Performed at [facility], [address] [place], [place] [address]    Report Status 05/14/2020 FINAL  Final  Culture, Urine     Status: Abnormal   Collection Time: 05/09/20  8:40 AM   Specimen: Urine, Random  Result Value Ref Range Status   Specimen Description URINE, RANDOM  Final   Special Requests   Final    NONE Performed at [facility], [address] [place], [place] [address]    Culture >=100,000 COLONIES/mL KLEBSIELLA PNEUMONIAE (A)  Final   Report Status  05/11/2020 FINAL  Final   Organism ID, Bacteria KLEBSIELLA PNEUMONIAE (A)  Final      Susceptibility   Klebsiella pneumoniae - MIC*    AMPICILLIN RESISTANT Resistant     CEFAZOLIN <=4 SENSITIVE Sensitive     CEFTRIAXONE <=0.25 SENSITIVE Sensitive     CIPROFLOXACIN <=0.25 SENSITIVE Sensitive     GENTAMICIN <=1 SENSITIVE Sensitive     IMIPENEM <=0.25 SENSITIVE Sensitive     NITROFURANTOIN 32 SENSITIVE Sensitive     TRIMETH/SULFA <=20 SENSITIVE Sensitive     AMPICILLIN/SULBACTAM 4 SENSITIVE Sensitive     PIP/TAZO <=4 SENSITIVE Sensitive     * >=100,000 COLONIES/mL  KLEBSIELLA PNEUMONIAE     RN Pressure Injury Documentation:     Estimated body mass index is 23.02 kg/m as calculated from the following:   Height as of 04/09/20: 5\' 6"  (1.676 m).   Weight as of this encounter: 64.7 kg.  Malnutrition Type:  Nutrition Problem: Inadequate oral intake Etiology: dysphagia, acute illness   Malnutrition Characteristics:  Signs/Symptoms: NPO status   Nutrition Interventions:  Interventions: Tube feeding     Radiology Studies: No results found. Scheduled Meds: . amLODipine  10 mg Per Tube Daily  . chlorhexidine  15 mL Mouth Rinse BID  . free water  150 mL Per Tube Q6H  . insulin aspart  0-9 Units Subcutaneous Q4H  . insulin glargine  10 Units Subcutaneous Daily  . ipratropium  0.5 mg Nebulization BID  . levalbuterol  0.63 mg Nebulization BID  . lisinopril  20 mg Per Tube BID  . mouth rinse  15 mL Mouth Rinse q12n4p  . metoprolol tartrate  25 mg Per Tube BID  . metroNIDAZOLE  500 mg Per Tube Q8H  . pantoprazole sodium  40 mg Per Tube BID  . senna-docusate  1 tablet Per Tube BID  . sodium chloride flush  10-40 mL Intracatheter Q12H   Continuous Infusions: . sodium chloride 75 mL/hr at 05/15/20 1056  . feeding supplement (OSMOLITE 1.2 CAL) 1,000 mL (05/12/20 1743)    LOS: 10 days   05/14/20, MD [facility] PAGER is on AMION  If 7PM-7AM, please contact night-coverage [email or web address]

## 2020-05-15 NOTE — Progress Notes (Signed)
Palliative Medicine Inpatient Follow Up Note  Reason for consult:  Goals of Care  HPI:  Per intake H&P --> Alan Warrenis an 64 y.o.malewith a PMHx of alcohol use and tobacco abuse who presented to the ED via EMS as a Code Stroke for acute onset of right sided weakness. he also had sensory loss and weakness involving his RUE and RLE. 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 Neurosurgery was consulted for EVD consideration which was then deferred. 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 Transferred under [facility] on 05/09/2020. On 05/08/2020, patient spiked fever and he was started on broad-spectrum antibiotics/IV Zosyn empirically. Despite of antibiotics, patient's white blood cells as well as fever continue to get worse with a T-max of 103.2 at around noon on 05/11/2020. Previous hospitalist Dr. Shelton Silvas had discussed case with ID who recommended scanning his chest and abdomen and continuing antibiotics. CT abdomen chest and pelvis shows bibasilar infiltrates suspecting possible pneumonia but no other pathology. 2D EchoEF 60-65%. UDS positive for cocaine.  Palliative care was consulted to address goals of care in the setting of multiple comorbidities and an acute stroke.   Today's Discussion (05/15/2020): Chart reviewed.  I met with nursing staff at bedside this morning, they shared that patient had only 1 dark stool overnight.  I met with patient's nephew at bedside in the late afternoon.  We discussed what the conversation between Alan Everett and I was the day prior.  I shared with his nephew that his prognosis is unclear though I do believe his life will be forever changed in the setting of this acute event.  His nephew states that the patient is able to mumble a few words today and does seem to recognize him which is an improvement from the days prior.  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  Patient's nephew attests and agrees with Alan Everett that they would want to continue all current measures to identify whether or not he could make improvements.  We again discussed that if he does not make improvements ongoing conversations about his goals would be of utility therefore having outpatient palliative care follow along is within the patient's best interest.  His nephew is in agreement with this.  Discussed the importance of continued conversation with family and their  medical providers regarding overall plan of care and treatment options, ensuring decisions are within the context of the patients values and GOCs.  Questions and concerns addressed   Objective Assessment: Vital Signs Vitals:   05/15/20 0803 05/15/20 1110  BP:  105/67  Pulse:  96  Resp:  18  Temp:  98.5 F (36.9 C)  SpO2: 100% 100%    Intake/Output Summary (Last 24 hours) at 05/15/2020 1321 Last data filed at 05/15/2020 0451 Gross per 24 hour  Intake --  Output 550 ml  Net -550 ml   Last Weight  Most recent update: 05/14/2020  5:16 AM   Weight  64.7 kg (142 lb 10.2 oz)           Gen:  Frail AA M in NAD HEENT: dry mucous membranes CV: Regular rate and rhythm, no murmurs rubs or gallops PULM: clear to auscultation bilaterally. No wheezes/rales/rhonchi ABD: soft/nontender/nondistended/normal bowel sounds Neuro: Alert  SUMMARY  OF RECOMMENDATIONS Full Code / Full Scope of Care inclusive of G-Tube placement if need be  TOC - OP Palliative Support  Ongoing palliative medicine support for goals of care conversations    Time Spent: 25 Greater than 50% of the time was spent in counseling and coordination of care ______________________________________________________________________________________ [place] Team Team Cell Phone: [phone] Please utilize secure chat with additional questions, if there is no response within 30 minutes please call the above phone number  Palliative Medicine Team providers are available by phone from 7am to 7pm daily and can be reached through the team cell phone.  Should this patient require assistance outside of these hours, please call the patient's attending physician.

## 2020-05-16 LAB — COMPREHENSIVE METABOLIC PANEL
ALT: 116 U/L — ABNORMAL HIGH (ref 0–44)
AST: 106 U/L — ABNORMAL HIGH (ref 15–41)
Albumin: 2.2 g/dL — ABNORMAL LOW (ref 3.5–5.0)
Alkaline Phosphatase: 113 U/L (ref 38–126)
Anion gap: 10 (ref 5–15)
BUN: 33 mg/dL — ABNORMAL HIGH (ref 8–23)
CO2: 21 mmol/L — ABNORMAL LOW (ref 22–32)
Calcium: 8.9 mg/dL (ref 8.9–10.3)
Chloride: 120 mmol/L — ABNORMAL HIGH (ref 98–111)
Creatinine, Ser: 1.16 mg/dL (ref 0.61–1.24)
GFR, Estimated: >60 mL/min (ref >60)
Glucose, Bld: 151 mg/dL — ABNORMAL HIGH (ref 70–99)
Potassium: 4.1 mmol/L (ref 3.5–5.1)
Sodium: 151 mmol/L — ABNORMAL HIGH (ref 135–145)
Total Bilirubin: 0.7 mg/dL (ref 0.3–1.2)
Total Protein: 7.2 g/dL (ref 6.5–8.1)

## 2020-05-16 LAB — CBC WITH DIFFERENTIAL/PLATELET
Abs Immature Granulocytes: 1.72 10*3/uL — ABNORMAL HIGH (ref 0.00–0.07)
Basophils Absolute: 0.1 10*3/uL (ref 0.0–0.1)
Basophils Relative: 1 %
Eosinophils Absolute: 0.9 10*3/uL — ABNORMAL HIGH (ref 0.0–0.5)
Eosinophils Relative: 4 %
HCT: 25.4 % — ABNORMAL LOW (ref 39.0–52.0)
Hemoglobin: 7.7 g/dL — ABNORMAL LOW (ref 13.0–17.0)
Immature Granulocytes: 7 %
Lymphocytes Relative: 8 %
Lymphs Abs: 2 10*3/uL (ref 0.7–4.0)
MCH: 29.8 pg (ref 26.0–34.0)
MCHC: 30.3 g/dL (ref 30.0–36.0)
MCV: 98.4 fL (ref 80.0–100.0)
Monocytes Absolute: 2.1 10*3/uL — ABNORMAL HIGH (ref 0.1–1.0)
Monocytes Relative: 9 %
Neutro Abs: 18.1 10*3/uL — ABNORMAL HIGH (ref 1.7–7.7)
Neutrophils Relative %: 71 %
Platelets: 268 10*3/uL (ref 150–400)
RBC: 2.58 MIL/uL — ABNORMAL LOW (ref 4.22–5.81)
RDW: 19 % — ABNORMAL HIGH (ref 11.5–15.5)
WBC: 25 10*3/uL — ABNORMAL HIGH (ref 4.0–10.5)
nRBC: 0.2 % (ref 0.0–0.2)

## 2020-05-16 LAB — GLUCOSE, CAPILLARY
Glucose-Capillary: 126 mg/dL — ABNORMAL HIGH (ref 70–99)
Glucose-Capillary: 129 mg/dL — ABNORMAL HIGH (ref 70–99)
Glucose-Capillary: 131 mg/dL — ABNORMAL HIGH (ref 70–99)
Glucose-Capillary: 134 mg/dL — ABNORMAL HIGH (ref 70–99)
Glucose-Capillary: 145 mg/dL — ABNORMAL HIGH (ref 70–99)
Glucose-Capillary: 148 mg/dL — ABNORMAL HIGH (ref 70–99)
Glucose-Capillary: 149 mg/dL — ABNORMAL HIGH (ref 70–99)

## 2020-05-16 MED ORDER — INSULIN GLARGINE 100 UNIT/ML ~~LOC~~ SOLN
15.0000 [IU] | Freq: Every day | SUBCUTANEOUS | Status: DC
  Administered 2020-05-17 – 2020-06-20 (×33): 15 [IU] via SUBCUTANEOUS
  Filled 2020-05-16 (×36): qty 0.15

## 2020-05-16 NOTE — Progress Notes (Signed)
PROGRESS NOTE    Alan Everett  DJS:[id number] DOB: 1955-11-23 DOA: 05/05/2020 PCP: Patient, No Pcp Per   Brief Narrative:  HPI per [name] on 05/05/20 Alan Everett is an 64 y.o. male with a PMHx of alcohol use and tobacco abuse who presented to the ED via EMS as a Code Stroke for acute onset of right sided weakness. he also had sensory loss and weakness involving his RUE and RLE. 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 Neurosurgery was consulted for EVD consideration which was then deferred.  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  Transferred under [facility] on 05/09/2020.  On 05/08/2020, patient spiked fever and he was started on broad-spectrum antibiotics/IV Zosyn empirically.  Despite of antibiotics, patient's white blood cells as well as fever continue to get worse with a T-max of 103.2 at around noon on 05/11/2020.  Previous hospitalist [name] had discussed case with ID who recommended scanning his chest and abdomen and continuing antibiotics.  CT abdomen chest and pelvis shows bibasilar infiltrates suspecting possible pneumonia but no other pathology. 2D Echo EF 60-65%. UDS positive for cocaine  CT head: 1. 2.3 x 2.1 x 2.9 cm acute intraparenchymal hemorrhage centered at the left thalamic capsular region, estimated volume 7 cc. Minimal surrounding vasogenic edema without significant regional mass effect or midline shift. Associated intraventricular extension with small volume intraventricular blood within the adjacent left lateral ventricle. No hydrocephalus or ventricular trapping. 2. No other acute intracranial abnormality. 3. Underlying age-related cerebral atrophy with mild chronic small vessel ischemic disease.    His oral iron was stopped yesterday and GI was consulted given concern of positive FOBT and drop in hemoglobin.  He is at high risk for  getting an EGD in the setting of his acute CVA and lack of overt bleeding.  They are recommending cross-sectional imaging to exclude large GI lesions within the small bowel.  They recommend if patient has progressive anemia, transfusion dependent or requirements they will consider an EGD with VCE placement given that he is unable to swallow.  [name] believes that he could have small bowel pathology and could be having some angiectasia's that have been bleeding given that he had recently had an EGD and colon a few months ago.  Assessment & Plan:   Active Problems:   ICH (intracerebral hemorrhage) (HCC)   SIRS (systemic inflammatory response syndrome) (HCC)   Polysubstance abuse (HCC)   Dysphagia, post-stroke   Hyperglycemia   Hypokalemia   Hypomagnesemia   Fever   SOB (shortness of breath)   Leukocytosis   Aspiration pneumonia of both lower lobes (HCC)   Palliative care by specialist   Goals of care, counseling/discussion   DNR (do not resuscitate) discussion  Acute Hemorrhagic Stroke/cerebral edema - L BG ICH w/ IVH d/t severe hypertension in setting of alcohol and cocaine use -Code Stroke CT head 10/6 showed L thalamocapsular IPH 7cc w/ minimal edema, mild IVH. Small vessel disease. Atrophy.  -CT head 10/6 2042 enlarging L thalamocapsular IPH w/ increasing IVH from L ventricle into 3rd and 4th ventricles. -CT head 10/7 0510 stable L thalamic IPH w/ IVH w/ clearing of blood in 4th ventricle.  -CTA head and neck 10/8 unremarkable angio, slight increase in midline shift and ventriculomegaly -MRI not able to perform due to no family contact to clear for metal -Repeat CT head 10/9 suboptimal but stable -2D Echo  EF 60-65% -UDS positive for cocaine -No antithrombotic prior to admission, now on No antithrombotic given IPH -Therapy recommendations:  CIR vs SNF and will go to CIR once more medically stable Neurology felt that his exam is essentially unchanged and recommending CIR when  medically stable for discharge.  Repeat CT head on 05/12/2020 essentially showed stable left thalamic hemorrhage and intraventricular extension since 05/08/2020.  Regional edema, mild regional mass-effect and mild lateral ventriculomegaly and trace SAH.  Neurology signed off on 05/14/2020.  Hypertensive Emergency -Per EMS, SBP > 200  -Home meds:  none -Weaned off off Cleviprex. -Now On Amlodipine 10 and lisinopril 20 bid and as needed IV hydralazine.  Blood pressure mostly controlled with intermittent tachycardia and thus Lopressor was added which I will continue. -SBP goal < 160 -Long-term BP goal normotensive;  Hyperlipidemia -Home meds:  None -LDL 80, goal < 70 -No statin in setting of IPH -Mild transaminitis, likely related to alcohol, AST/ALT 68/64 ->52/47 -> 35/45 -> and is Now worsening and is 90/78 respectively -Will Consider statin low dose on discharge with normalized liver enzymes and defer to Neurology to initiate   Dysphagia -Secondary to stroke -C/w NPO; SLP on board.  Patient on tube feedings at 65 mL. cortrak in place. SLP managing. On IVF with NS at 30 mL/hr which will now be discontinued   Tobacco Abuse -Patient is a current smoker and will need smoking cessation counseling given once he is more awake  Cocaine abuse -UDS positive for cocaine -Cocaine cessation will be provided when he is more awake -Avoid beta-blocker  Hypophosphatemia Resolved.  Hypokalemia Resolved.  Normocytic Anemia -Patient's Hgb/Hct trending down from 12.0/38.3 -> 11.9/37.9 -> 10.7/34.1 and is trending down to 9.5/30.6 -> 8.7/28.4> 6.8/22.1 received 2 units of PRBC on 05/12/2020> 10.4/32.5. -He is having black stools so iron supplementation was stopped.  FOBT positive.  -Gastroenterology were consulted and recently had an EGD and colonoscopy over the last 2 months.  [name] recommends next up in evaluating would be a video capsule endoscopy but it cannot be formed due to his recent  CVA and dysphagia and since his inability swallowed it would have to be done endoscopically and placed an EGD.  Currently endoscopic procedures are high risk with his recent acute CVA and anything small bowel pathology would require a deep enteroscopy or may not be reachable via endoscopically and likely the pathology would be intestinal angiectasia's.  Dr. Sharla KidneyPirtle recommends continue to monitor carefully. 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  If recurrent transfusion needed, will call GI back.  SIRS with Leukocytosis and Fever in the setting of Aspiration Pneumonia, leukocytosis was worsening up until yesterday.  -Likely in the setting of aspiration pneumonia. -CT chest shows bibasilar pneumonia.  CT abdomen pelvis negative.  Culture negative so far.  Last temperature 100.9 at 11 AM on 05/12/2020.  Afebrile since then.  ID thinks that his fever is coming from ICH.  However they added Flagyl to his current regime of Rocephin and Zithromax on 05/13/2020 and all antibiotics were discontinued on 05/15/2020 per ID recommendations.  Leukocytosis improving slowly. fortunately, he has remained afebrile since 05/12/2020.  GERD -C/w Pantorpazole 40 mg per Tube Daily qHS and will increase to twice daily   Hypernatremia/hyperchloremia -Mild hyperchloremia but hyponatremia resolved.  Thrombocytopenia Resolved.  Hyperglycemia  Slightly hyperglycemic.  Will increase Lantus to 15 units  and continue SSI.  DVT prophylaxis: SCDs Code Status: FULL CODE Family Communication: No family present at bedside.  Discussed with his wife over the phone few days ago.  She wants to continue full code.  Palliative care in communication with family.  They want full aggressive care at this point in time. Disposition Plan: Need further clinical improvement back to baseline and anticipate discharge  to CIR versus SNF once medically stable  Status is: Inpatient  Remains inpatient appropriate because:Altered mental status, Unsafe d/c plan, IV treatments appropriate due to intensity of illness or inability to take PO and Inpatient level of care appropriate due to severity of illness   Dispo: The patient is from: Home              Anticipated d/c is to: CIR vs. SNF              Anticipated d/c date is: 3 days              Patient currently is not medically stable to d/c.  Consultants:   Neurology  Gastroenterology  Case was discussed with infectious diseases Dr. Daiva Eves  Procedures:  ECHOCARDIOGRAM IMPRESSIONS    1. Left ventricular ejection fraction, by estimation, is 60 to 65%. The  left ventricle has normal function. The left ventricle has no regional  wall motion abnormalities. Left ventricular diastolic parameters are  consistent with Grade I diastolic  dysfunction (impaired relaxation).  2. Right ventricular systolic function is normal. The right ventricular  size is normal. Tricuspid regurgitation signal is inadequate for assessing  PA pressure.  3. The mitral valve is normal in structure. No evidence of mitral valve  regurgitation. No evidence of mitral stenosis.  4. The aortic valve is tricuspid. Aortic valve regurgitation is not  visualized. No aortic stenosis is present.  5. The inferior vena cava is normal in size with greater than 50%  respiratory variability, suggesting right atrial pressure of 3 mmHg.   FINDINGS  Left Ventricle: Left ventricular ejection fraction, by estimation, is 60  to 65%. The left ventricle has normal function. The left ventricle has no  regional wall motion abnormalities. The left ventricular internal cavity  size was normal in size. There is  no left ventricular hypertrophy. Left ventricular diastolic parameters  are consistent with Grade I diastolic dysfunction (impaired relaxation).   Right Ventricle: The right  ventricular size is normal. No increase in  right ventricular wall thickness. Right ventricular systolic function is  normal. Tricuspid regurgitation signal is inadequate for assessing PA  pressure.   Left Atrium: Left atrial size was normal in size.   Right Atrium: Right atrial size was normal in size.   Pericardium: There is no evidence of pericardial effusion.   Mitral Valve: The mitral valve is normal in structure. No evidence of  mitral valve regurgitation. No evidence of mitral valve stenosis.   Tricuspid Valve: The tricuspid valve is normal in structure. Tricuspid  valve regurgitation is not demonstrated.   Aortic Valve: The aortic valve is tricuspid. Aortic valve regurgitation is  not visualized. No aortic stenosis is present.   Pulmonic Valve: The pulmonic valve was normal in structure. Pulmonic valve  regurgitation is not visualized.   Aorta: The aortic root is normal in size and structure.   Venous: The inferior vena cava is normal in size with greater than 50%  respiratory variability, suggesting right atrial pressure of 3 mmHg.   IAS/Shunts: No atrial level shunt detected by color flow Doppler.  LEFT VENTRICLE  PLAX 2D  LVIDd:     4.30 cm Diastology  LVIDs:     2.70 cm LV e' medial:  8.49 cm/s  LV PW:     1.00 cm LV E/e' medial: 9.2  LV IVS:    0.90 cm LV e' lateral:  7.46 cm/s  LVOT diam:   2.20 cm LV E/e' lateral: 10.4  LV SV:     98  LV SV Index:  57  LVOT Area:   3.80 cm     RIGHT VENTRICLE  RV S prime:   13.90 cm/s  TAPSE (M-mode): 2.0 cm   LEFT ATRIUM       Index    RIGHT ATRIUM      Index  LA diam:    3.10 cm 1.78 cm/m RA Area:   11.60 cm  LA Vol (A2C):  40.9 ml 23.49 ml/m RA Volume:  24.60 ml 14.13 ml/m  LA Vol (A4C):  37.9 ml 21.77 ml/m  LA Biplane Vol: 43.0 ml 24.69 ml/m  AORTIC VALVE  LVOT Vmax:  120.00 cm/s  LVOT Vmean: 75.400 cm/s  LVOT VTI:  0.259 m      AORTA  Ao Root diam: 3.20 cm   MITRAL VALVE  MV Area (PHT): 2.80 cm  SHUNTS  MV Decel Time: 271 msec  Systemic VTI: 0.26 m  MV E velocity: 77.70 cm/s Systemic Diam: 2.20 cm  MV A velocity: 84.50 cm/s  MV E/A ratio: 0.92   Antimicrobials:  Anti-infectives (From admission, onward)   Start     Dose/Rate Route Frequency Ordered Stop   05/14/20 1000  cefTRIAXone (ROCEPHIN) 2 g in sodium chloride 0.9 % 100 mL IVPB        2 g 200 mL/hr over 30 Minutes Intravenous Daily 05/13/20 1454 05/15/20 1255   05/13/20 2200  metroNIDAZOLE (FLAGYL) tablet 500 mg        500 mg Per Tube Every 8 hours 05/13/20 1847 05/15/20 2219   05/13/20 1400  metroNIDAZOLE (FLAGYL) tablet 500 mg  Status:  Discontinued        500 mg Oral Every 8 hours 05/13/20 0935 05/13/20 1847   05/12/20 0900  cefTRIAXone (ROCEPHIN) 1 g in sodium chloride 0.9 % 100 mL IVPB  Status:  Discontinued        1 g 200 mL/hr over 30 Minutes Intravenous Daily 05/12/20 0817 05/13/20 1454   05/12/20 0900  azithromycin (ZITHROMAX) 500 mg in sodium chloride 0.9 % 250 mL IVPB  Status:  Discontinued        500 mg 250 mL/hr over 60 Minutes Intravenous Daily 05/12/20 0817 05/13/20 0935   05/09/20 0930  Ampicillin-Sulbactam (UNASYN) 3 g in sodium chloride 0.9 % 100 mL IVPB  Status:  Discontinued        3 g 200 mL/hr over 30 Minutes Intravenous Every 8 hours 05/09/20 0837 05/09/20 0839   05/09/20 0930  Ampicillin-Sulbactam (UNASYN) 3 g in sodium chloride 0.9 % 100 mL IVPB  Status:  Discontinued        3 g 200 mL/hr over 30 Minutes Intravenous Every 6 hours 05/09/20 0839 05/12/20 0817        Subjective: Patient seen and examined.  Status quo, once again leaning on his left side, preferring left side.  Does not track at all.  Even when I moved his head to the right, he turned to the left again.  He had some stiff neck.  Objective: Vitals:   05/15/20 2349 05/16/20 0534 05/16/20  1610 05/16/20 0851  BP: 112/61 119/60  128/63  Pulse: 90 95   91  Resp: Temp: 99.1 F (37.3 C) 98.5 F (36.9 C)  98 F (36.7 C)  TempSrc: Oral Oral    SpO2: 98% 98% 98% 100%  Weight:        Intake/Output Summary (Last 24 hours) at 05/16/2020 1202 Last data filed at 05/16/2020 0532 Gross per 24 hour  Intake 4611.53 ml  Output 1601 ml  Net 3010.53 ml   Filed Weights   05/11/20 0331 05/12/20 0434 05/14/20 0500  Weight: 61.4 kg 62.5 kg 64.7 kg   Examination: Physical Exam:  General exam: Appears calm and comfortable but leaning towards the left side.  Cortrak in place. Respiratory system: Clear to auscultation. Respiratory effort normal. Cardiovascular system: S1 & S2 heard, RRR. No JVD, murmurs, rubs, gallops or clicks. No pedal edema. Gastrointestinal system: Abdomen is nondistended, soft and nontender. No organomegaly or masses felt. Normal bowel sounds heard. Central nervous system: Alert but disoriented.    Data Reviewed: I have personally reviewed following labs and imaging studies  CBC: Recent Labs  Lab 05/12/20 0510 05/12/20 0510 05/12/20 1911 05/13/20 1009 05/14/20 0117 05/15/20 0354 05/16/20 0242  WBC 31.9*  --   --  33.5* 29.2* 29.2* 25.0*  NEUTROABS 25.3*  --   --  24.8* 21.7* 21.7* 18.1*  HGB 6.8*   < > 10.4* 8.8* 8.8* 8.3* 7.7*  HCT 22.1*   < > 32.5* 27.5* 27.6* 26.3* 25.4*  MCV 93.6  --   --  89.6 91.4 93.3 98.4  PLT 189  --   --  232 252 287 268   < > = values in this interval not displayed.   Basic Metabolic Panel: Recent Labs  Lab 05/10/20 0200 05/10/20 0200 05/11/20 0637 05/11/20 0637 05/12/20 0510 05/13/20 1009 05/14/20 0117 05/15/20 0354 05/16/20 0242  NA 142   < > 146*   < > 145 149* 147* 153* 151*  K 3.4*   < > 4.1   < > 3.8 4.0 4.0 4.2 4.1  CL 110   < > 116*   < > 115* 118* 119* 122* 120*  CO2 23   < > 19*   < > 22 19* 21* 20* 21*  GLUCOSE 146*   < > 157*   < > 205* 154* 169* 171* 151*  BUN 24*   < > 21   < > 25* 26* 31* 35* 33*  CREATININE 0.90   < > 0.91   < > 1.08 1.08 1.08  1.25* 1.16  CALCIUM 8.5*   < > 8.9   < > 8.5* 8.7* 9.0 9.1 8.9  MG 2.3  --  2.1  --  2.3 2.4  --   --   --   PHOS 2.6  --  3.0  --  3.1  --   --   --   --    < > = values in this interval not displayed.   GFR: Estimated Creatinine Clearance: 58.1 mL/min (by C-G formula based on SCr of 1.16 mg/dL). Liver Function Tests: Recent Labs  Lab 05/10/20 0200 05/11/20 0637 05/12/20 0510 05/16/20 0242  AST 39 90* 118* 106*  ALT 40 78* 86* 116*  ALKPHOS 63 71 58 113  BILITOT 0.2* 0.7 0.7 0.7  PROT 7.1 7.8 6.7 7.2  ALBUMIN 2.5* 2.6* 2.2* 2.2*   No results for input(s): LIPASE, AMYLASE in the last 168 hours. No  results for input(s): AMMONIA in the last 168 hours. Coagulation Profile: No results for input(s): INR, PROTIME in the last 168 hours. Cardiac Enzymes: No results for input(s): CKTOTAL, CKMB, CKMBINDEX, TROPONINI in the last 168 hours. BNP (last 3 results) No results for input(s): PROBNP in the last 8760 hours. HbA1C: No results for input(s): HGBA1C in the last 72 hours. CBG: Recent Labs  Lab 05/15/20 1653 05/15/20 1935 05/16/20 0007 05/16/20 0408 05/16/20 0850  GLUCAP 155* 103* 149* 148* 145*   Lipid Profile: No results for input(s): CHOL, HDL, LDLCALC, TRIG, CHOLHDL, LDLDIRECT in the last 72 hours. Thyroid Function Tests: No results for input(s): TSH, T4TOTAL, FREET4, T3FREE, THYROIDAB in the last 72 hours. Anemia Panel: No results for input(s): VITAMINB12, FOLATE, FERRITIN, TIBC, IRON, RETICCTPCT in the last 72 hours. Sepsis Labs: Recent Labs  Lab 05/12/20 0510  PROCALCITON 0.21    Recent Results (from the past 240 hour(s))  Culture, blood (Routine X 2) w Reflex to ID Panel     Status: None   Collection Time: 05/07/20  7:59 PM   Specimen: BLOOD LEFT HAND  Result Value Ref Range Status   Specimen Description BLOOD LEFT HAND  Final   Special Requests   Final    BOTTLES DRAWN AEROBIC AND ANAEROBIC Blood Culture adequate volume   Culture   Final    NO GROWTH 5  DAYS Performed at [facility], [address] [place], [place] [address]    Report Status 05/12/2020 FINAL  Final  Culture, blood (Routine X 2) w Reflex to ID Panel     Status: None   Collection Time: 05/07/20  8:06 PM   Specimen: BLOOD RIGHT HAND  Result Value Ref Range Status   Specimen Description BLOOD RIGHT HAND  Final   Special Requests   Final    BOTTLES DRAWN AEROBIC AND ANAEROBIC Blood Culture adequate volume   Culture   Final    NO GROWTH 5 DAYS Performed at [facility], [address] [place], [place] [address]    Report Status 05/12/2020 FINAL  Final  Culture, blood (routine x 2)     Status: None   Collection Time: 05/09/20  8:27 AM   Specimen: BLOOD  Result Value Ref Range Status   Specimen Description BLOOD RIGHT ANTECUBITAL  Final   Special Requests   Final    AEROBIC BOTTLE ONLY Blood Culture results may not be optimal due to an inadequate volume of blood received in culture bottles   Culture   Final    NO GROWTH 5 DAYS Performed at [facility], [address] [place], [place] [address]    Report Status 05/14/2020 FINAL  Final  Culture, blood (routine x 2)     Status: None   Collection Time: 05/09/20  8:27 AM   Specimen: BLOOD  Result Value Ref Range Status   Specimen Description BLOOD RIGHT ANTECUBITAL  Final   Special Requests   Final    AEROBIC BOTTLE ONLY Blood Culture results may not be optimal due to an inadequate volume of blood received in culture bottles   Culture   Final    NO GROWTH 5 DAYS Performed at [facility], [address], [place], [place] [address]    Report Status 05/14/2020 FINAL  Final  Culture, Urine     Status: Abnormal   Collection Time: 05/09/20  8:40 AM   Specimen: Urine, Random  Result Value Ref Range Status   Specimen  Description URINE, RANDOM  Final   Special Requests   Final    NONE Performed at [facility], [address] [place], [place] [address]    Culture >=100,000 COLONIES/mL  KLEBSIELLA PNEUMONIAE (A)  Final   Report Status 05/11/2020 FINAL  Final   Organism ID, Bacteria KLEBSIELLA PNEUMONIAE (A)  Final      Susceptibility   Klebsiella pneumoniae - MIC*    AMPICILLIN RESISTANT Resistant     CEFAZOLIN <=4 SENSITIVE Sensitive     CEFTRIAXONE <=0.25 SENSITIVE Sensitive     CIPROFLOXACIN <=0.25 SENSITIVE Sensitive     GENTAMICIN <=1 SENSITIVE Sensitive     IMIPENEM <=0.25 SENSITIVE Sensitive     NITROFURANTOIN 32 SENSITIVE Sensitive     TRIMETH/SULFA <=20 SENSITIVE Sensitive     AMPICILLIN/SULBACTAM 4 SENSITIVE Sensitive     PIP/TAZO <=4 SENSITIVE Sensitive     * >=100,000 COLONIES/mL KLEBSIELLA PNEUMONIAE     RN Pressure Injury Documentation:     Estimated body mass index is 23.02 kg/m as calculated from the following:   Height as of 04/09/20: 5\' 6"  (1.676 m).   Weight as of this encounter: 64.7 kg.  Malnutrition Type:  Nutrition Problem: Inadequate oral intake Etiology: dysphagia, acute illness   Malnutrition Characteristics:  Signs/Symptoms: NPO status   Nutrition Interventions:  Interventions: Tube feeding     Radiology Studies: No results found. Scheduled Meds: . amLODipine  10 mg Per Tube Daily  . chlorhexidine  15 mL Mouth Rinse BID  . free water  150 mL Per Tube Q6H  . insulin aspart  0-9 Units Subcutaneous Q4H  . [START ON 05/17/2020] insulin glargine  15 Units Subcutaneous Daily  . ipratropium  0.5 mg Nebulization BID  . levalbuterol  0.63 mg Nebulization BID  . lisinopril  20 mg Per Tube BID  . mouth rinse  15 mL Mouth Rinse q12n4p  . metoprolol tartrate  25 mg Per Tube BID  . pantoprazole sodium  40 mg Per Tube BID  . senna-docusate  1 tablet Per Tube BID  . sodium chloride flush  10-40 mL Intracatheter Q12H   Continuous Infusions: . feeding supplement (OSMOLITE 1.2 CAL) 1,000 mL (05/15/20 1610)    LOS: 11 days   Hughie Clossavi Emit Kuenzel, MD [facility] PAGER is on AMION  If 7PM-7AM, please contact  night-coverage [email or web address]

## 2020-05-16 NOTE — Progress Notes (Addendum)
Palliative Medicine Inpatient Follow Up Note  Reason for consult:  Goals of Care  HPI:  Per intake H&P --> Alan Warrenis an 64 y.o.malewith a PMHx of alcohol use and tobacco abuse who presented to the ED via EMS as a Code Stroke for acute onset of right sided weakness. he also had sensory loss and weakness involving his RUE and RLE. 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 Neurosurgery was consulted for EVD consideration which was then deferred. 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 Transferred under [facility] on 05/09/2020. On 05/08/2020, patient spiked fever and he was started on broad-spectrum antibiotics/IV Zosyn empirically. Despite of antibiotics, patient's white blood cells as well as fever continue to get worse with a T-max of 103.2 at around noon on 05/11/2020. Previous hospitalist [name] had discussed case with ID who recommended scanning his chest and abdomen and continuing antibiotics. CT abdomen chest and pelvis shows bibasilar infiltrates suspecting possible pneumonia but no other pathology. 2D EchoEF 60-65%. UDS positive for cocaine.  Palliative care was consulted to address goals of care in the setting of multiple comorbidities and an acute stroke.   Today's Discussion (05/16/2020): Chart reviewed.  I communicated with night nursing team who stated there were no significant issues.  I have evaluated Alan Everett this morning.  He was noted to be favoring his left side.  He was in no distress at this time.  He does open his eyes intermittently and is able to track me as I move around the room.  Per physical therapy no review he will likely transition to skilled nursing once medically optimized  As of present one of the concerns presented to family is his inability to safely swallow.  Both the patient's niece and nephew would be in agreement with a  G-tube placement to see if he could make eventual recovery.  Aspiration risk discussed in great detail however family wishes to continue with full scope of treatment despite these ongoing risks.  We discussed that if that is aligned with the patient's personal goals of course we would be respectful of that.  I shared the wary that in instances such as with Nature when you get to a point where you are recurrently aspirating it could very well lead to respiratory distress and worst-case intubation.  We talked about the cyclic nature of aspiration pneumonia in patient such as their uncle.  They did understand this and do feel that everything that is being done presently is aligned with what [name] would want for himself.  Discussed the importance of continued conversation with family and their  medical providers regarding overall plan of care and treatment options, ensuring decisions are within the context of the patients values and GOCs.  Questions and concerns addressed   Objective Assessment: Vital Signs Vitals:   05/15/20 2349 05/16/20 0534  BP: 112/61 119/60  Pulse: 90 95  Resp: 20 18  Temp: 99.1 F (37.3 C) 98.5 F (36.9 C)  SpO2: 98% 98%    Intake/Output Summary (Last 24 hours) at 05/16/2020 0740 Last data filed at 05/16/2020 0532 Gross per 24 hour  Intake 4611.53 ml  Output 1601 ml  Net 3010.53 ml   Last Weight  Most recent update: 05/14/2020  5:16 AM   Weight  64.7 kg (142 lb 10.2 oz)           Gen:  Frail AA M in  NAD HEENT: dry mucous membranes, coretrack in place CV: Regular rate and rhythm, no murmurs rubs or gallops PULM: clear to auscultation bilaterally. No wheezes/rales/rhonchi ABD: soft/nontender/nondistended/normal bowel sounds Ext: Unable to utilize right side Neuro: Alert - can track  SUMMARY OF RECOMMENDATIONS Full Code / Full Scope of Care inclusive of G-Tube placement if need be  TOC - OP Palliative Support  Incremental palliative team support.   Please call for any imminent needs.  Time Spent: 15 Greater than 50% of the time was spent in counseling and coordination of care ______________________________________________________________________________________ Lamarr Lulas [facility] Palliative Medicine Team Team Cell Phone: [phone] Please utilize secure chat with additional questions, if there is no response within 30 minutes please call the above phone number  Palliative Medicine Team providers are available by phone from 7am to 7pm daily and can be reached through the team cell phone.  Should this patient require assistance outside of these hours, please call the patient's attending physician.

## 2020-05-17 LAB — CBC WITH DIFFERENTIAL/PLATELET
Abs Immature Granulocytes: 1.77 10*3/uL — ABNORMAL HIGH (ref 0.00–0.07)
Basophils Absolute: 0.1 10*3/uL (ref 0.0–0.1)
Basophils Relative: 0 %
Eosinophils Absolute: 1 10*3/uL — ABNORMAL HIGH (ref 0.0–0.5)
Eosinophils Relative: 4 %
HCT: 24.5 % — ABNORMAL LOW (ref 39.0–52.0)
Hemoglobin: 7.4 g/dL — ABNORMAL LOW (ref 13.0–17.0)
Immature Granulocytes: 8 %
Lymphocytes Relative: 8 %
Lymphs Abs: 1.9 10*3/uL (ref 0.7–4.0)
MCH: 29.4 pg (ref 26.0–34.0)
MCHC: 30.2 g/dL (ref 30.0–36.0)
MCV: 97.2 fL (ref 80.0–100.0)
Monocytes Absolute: 1.7 10*3/uL — ABNORMAL HIGH (ref 0.1–1.0)
Monocytes Relative: 8 %
Neutro Abs: 16.3 10*3/uL — ABNORMAL HIGH (ref 1.7–7.7)
Neutrophils Relative %: 72 %
Platelets: 313 10*3/uL (ref 150–400)
RBC: 2.52 MIL/uL — ABNORMAL LOW (ref 4.22–5.81)
RDW: 18.5 % — ABNORMAL HIGH (ref 11.5–15.5)
WBC: 22.4 10*3/uL — ABNORMAL HIGH (ref 4.0–10.5)
nRBC: 0.2 % (ref 0.0–0.2)

## 2020-05-17 LAB — GLUCOSE, CAPILLARY
Glucose-Capillary: 135 mg/dL — ABNORMAL HIGH (ref 70–99)
Glucose-Capillary: 143 mg/dL — ABNORMAL HIGH (ref 70–99)
Glucose-Capillary: 127 mg/dL — ABNORMAL HIGH (ref 70–99)
Glucose-Capillary: 146 mg/dL — ABNORMAL HIGH (ref 70–99)
Glucose-Capillary: 137 mg/dL — ABNORMAL HIGH (ref 70–99)

## 2020-05-17 MED ORDER — LEVALBUTEROL HCL 0.63 MG/3ML IN NEBU: 0.6300 mg | INHALATION_SOLUTION | Freq: Four times a day (QID) | RESPIRATORY_TRACT | Status: DC | PRN

## 2020-05-17 NOTE — Progress Notes (Signed)
Physical Therapy Treatment Patient Details Name: Alan Everett MRN: [id number] DOB: [birth year] Today's Date: 05/17/2020    History of Present Illness Patient is a 64 y/o male with PMH ETOH use, tobacco abuse, admitted with R side weakness,sensory loss and speech deficits.  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    PT Comments    Pt sleeping and not easily arousable until +2 tot A to EOB. Pt sat EOB >15 mins working on trunk control and facilitation of UE and LE mvmt. Pt occasionally following simple commands with LLE. Min/ mod A in sitting with pt's hands in lap. If allowed to push with LUE, requires max A in sitting. Pt not resistive of treatment in any way but limited by impairments and lethargy. PT will continue to follow.    Follow Up Recommendations  SNF;Supervision/Assistance - 24 hour     Equipment Recommendations  Wheelchair cushion (measurements PT);Wheelchair (measurements PT);Hospital bed;Other (comment) (hoyer)    Recommendations for Other Services       Precautions / Restrictions Precautions Precautions: Fall Precaution Comments: soft cuff restraint L wrist, pushes to Rt, coretrack, BP <180 Restrictions Weight Bearing Restrictions: No    Mobility  Bed Mobility Overal bed mobility: Needs Assistance Bed Mobility: Supine to Sit;Sit to Supine     Supine to sit: +2 for physical assistance;Total assist Sit to supine: +2 for physical assistance;Max assist   General bed mobility comments: pt very lethargic with supine to sit so all mvmt facilitated by therapist. Pt awake with return to supine and assisted minimally with trunk control  Transfers                 General transfer comment: Deferred due to arousal/safety.  Ambulation/Gait             General Gait Details: unable   Stairs             Wheelchair Mobility    Modified Rankin (Stroke Patients Only) Modified Rankin  (Stroke Patients Only) Pre-Morbid Rankin Score: No symptoms Modified Rankin: Severe disability     Balance Overall balance assessment: Needs assistance Sitting-balance support: Feet unsupported Sitting balance-Leahy Scale: Poor Sitting balance - Comments: pt initially pushing with LUE and requiring max A to maintain sitting. Once LUE placed in pt's lap, able to sit with min/ mod A, posterior lean at times. Maintained sitting 15 mins Postural control: Posterior lean;Right lateral lean                     High Level Balance Comments: performed seated activities with bedside tray table with both hands up on table. Hand over hand facilitation of R hand "washing" table. Encouraged pt to perform on his own with L hand but still needed hand over hand facilitation for this side. Worked on deep pressure through R hand in sitting EOB with fingers in extension            Cognition Arousal/Alertness: Lethargic Behavior During Therapy: Flat affect Overall Cognitive Status: Difficult to assess                                 General Comments: occasional partial smile and followed a few simple LLE commands      Exercises General Exercises - Lower Extremity Long Arc Quad: AAROM;PROM;Both;10 reps;Seated Other Exercises Other Exercises: supine: Gentle ranging of B LEs, flexing hip, knees, and ankles  simultaneously followed by extending hips and knees to neutral. Other Exercises: attempted to have pt push tray table away with RLE, pt performed minimally Other Exercises: worked on passive cervical rotation and lateral flexion to R    General Comments General comments (skin integrity, edema, etc.): L wrist restraint and mitt replaced after session. Pt returned to sleep before therapist exited room      Pertinent Vitals/Pain Pain Assessment: Faces Faces Pain Scale: Hurts little more Pain Location: b knees with passive extension Pain Descriptors / Indicators:  Grimacing Pain Intervention(s): Limited activity within patient's tolerance;Monitored during session    Home Living                      Prior Function            PT Goals (current goals can now be found in the care plan section) Acute Rehab PT Goals Patient Stated Goal: unable to state PT Goal Formulation: Patient unable to participate in goal setting Time For Goal Achievement: 05/21/20 Potential to Achieve Goals: Fair Progress towards PT goals: Not progressing toward goals - comment (lethargy)    Frequency    Min 3X/week      PT Plan Current plan remains appropriate    Co-evaluation              AM-PAC PT "6 Clicks" Mobility   Outcome Measure  Help needed turning from your back to your side while in a flat bed without using bedrails?: Total Help needed moving from lying on your back to sitting on the side of a flat bed without using bedrails?: Total Help needed moving to and from a bed to a chair (including a wheelchair)?: Total Help needed standing up from a chair using your arms (e.g., wheelchair or bedside chair)?: Total Help needed to walk in hospital room?: Total Help needed climbing 3-5 steps with a railing? : Total 6 Click Score: 6    End of Session Equipment Utilized During Treatment: Gait belt Activity Tolerance: Patient limited by lethargy Patient left: in bed;with call bell/phone within reach;with bed alarm set;with SCD's reapplied;with restraints reapplied Nurse Communication: Mobility status;Need for lift equipment PT Visit Diagnosis: Unsteadiness on feet (R26.81);Muscle weakness (generalized) (M62.81);Difficulty in walking, not elsewhere classified (R26.2);Other symptoms and signs involving the nervous system (R29.898);Hemiplegia and hemiparesis Hemiplegia - Right/Left: Right Hemiplegia - dominant/non-dominant: Dominant Hemiplegia - caused by: Nontraumatic intracerebral hemorrhage     Time: 1057-1120 PT Time Calculation (min) (ACUTE  ONLY): 23 min  Charges:  $Therapeutic Activity: 8-22 mins $Neuromuscular Re-education: 8-22 mins                     Lyanne Co, PT  Acute Rehab Services  Pager [phone] Office [phone]    Lawana Chambers Xylan Sheils 05/17/2020, 1:15 PM

## 2020-05-17 NOTE — Progress Notes (Signed)
PROGRESS NOTE    Alan Everett  ZOX:[id number] DOB: 1955-09-09 DOA: 05/05/2020 PCP: Patient, No Pcp Per   Brief Narrative:  HPI per [name] on 05/05/20 Alan Everett is an 64 y.o. male with a PMHx of alcohol use and tobacco abuse who presented to the ED via EMS as a Code Stroke for acute onset of right sided weakness. he also had sensory loss and weakness involving his RUE and RLE. 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 Neurosurgery was consulted for EVD consideration which was then deferred.  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  Transferred under [facility] on 05/09/2020.  On 05/08/2020, patient spiked fever and he was started on broad-spectrum antibiotics/IV Zosyn empirically.  Despite of antibiotics, patient's white blood cells as well as fever continue to get worse with a T-max of 103.2 at around noon on 05/11/2020.  Previous hospitalist [name] had discussed case with ID who recommended scanning his chest and abdomen and continuing antibiotics.  CT abdomen chest and pelvis shows bibasilar infiltrates suspecting possible pneumonia but no other pathology. 2D Echo EF 60-65%. UDS positive for cocaine  CT head: 1. 2.3 x 2.1 x 2.9 cm acute intraparenchymal hemorrhage centered at the left thalamic capsular region, estimated volume 7 cc. Minimal surrounding vasogenic edema without significant regional mass effect or midline shift. Associated intraventricular extension with small volume intraventricular blood within the adjacent left lateral ventricle. No hydrocephalus or ventricular trapping. 2. No other acute intracranial abnormality. 3. Underlying age-related cerebral atrophy with mild chronic small vessel ischemic disease.    His oral iron was stopped yesterday and GI was consulted given concern of positive FOBT and drop in hemoglobin.  He is at high risk for  getting an EGD in the setting of his acute CVA and lack of overt bleeding.  They are recommending cross-sectional imaging to exclude large GI lesions within the small bowel.  They recommend if patient has progressive anemia, transfusion dependent or requirements they will consider an EGD with VCE placement given that he is unable to swallow.  [name] believes that he could have small bowel pathology and could be having some angiectasia's that have been bleeding given that he had recently had an EGD and colon a few months ago.  Assessment & Plan:   Active Problems:   ICH (intracerebral hemorrhage) (HCC)   SIRS (systemic inflammatory response syndrome) (HCC)   Polysubstance abuse (HCC)   Dysphagia, post-stroke   Hyperglycemia   Hypokalemia   Hypomagnesemia   Fever   SOB (shortness of breath)   Leukocytosis   Aspiration pneumonia of both lower lobes (HCC)   Palliative care by specialist   Goals of care, counseling/discussion   DNR (do not resuscitate) discussion  Acute Hemorrhagic Stroke/cerebral edema - L BG ICH w/ IVH d/t severe hypertension in setting of alcohol and cocaine use -Code Stroke CT head 10/6 showed L thalamocapsular IPH 7cc w/ minimal edema, mild IVH. Small vessel disease. Atrophy.  -CT head 10/6 2042 enlarging L thalamocapsular IPH w/ increasing IVH from L ventricle into 3rd and 4th ventricles. -CT head 10/7 0510 stable L thalamic IPH w/ IVH w/ clearing of blood in 4th ventricle.  -CTA head and neck 10/8 unremarkable angio, slight increase in midline shift and ventriculomegaly -MRI not able to perform due to no family contact to clear for metal -Repeat CT head 10/9 suboptimal but stable -2D Echo  EF 60-65% -UDS positive for cocaine -No antithrombotic prior to admission, now on No antithrombotic given IPH -Therapy recommendations:  CIR vs SNF and will go to CIR once more medically stable Neurology felt that his exam is essentially unchanged and recommending CIR when  medically stable for discharge.  Repeat CT head on 05/12/2020 essentially showed stable left thalamic hemorrhage and intraventricular extension since 05/08/2020.  Regional edema, mild regional mass-effect and mild lateral ventriculomegaly and trace SAH.  Neurology signed off on 05/14/2020.  Hypertensive Emergency -Per EMS, SBP > 200  -Home meds:  none -Weaned off off Cleviprex. -Now On Amlodipine 10 and lisinopril 20 bid and as needed IV hydralazine.  Blood pressure mostly controlled with intermittent tachycardia and thus Lopressor was added which I will continue. -SBP goal < 160 -Long-term BP goal normotensive;  Hyperlipidemia -Home meds:  None -LDL 80, goal < 70 -No statin in setting of IPH -Mild transaminitis, likely related to alcohol, AST/ALT 68/64 ->52/47 -> 35/45 -> and is Now worsening and is 90/78 respectively -Will Consider statin low dose on discharge with normalized liver enzymes and defer to Neurology to initiate   Dysphagia -Secondary to stroke -C/w NPO; SLP on board.  Patient on tube feedings at 65 mL. cortrak in place. SLP managing. On IVF with NS at 30 mL/hr which will now be discontinued   Tobacco Abuse -Patient is a current smoker and will need smoking cessation counseling given once he is more awake  Cocaine abuse -UDS positive for cocaine -Cocaine cessation will be provided when he is more awake -Avoid beta-blocker  Hypophosphatemia Resolved.  Hypokalemia Resolved.  Normocytic Anemia -Patient's Hgb/Hct trending down from 12.0/38.3 -> 11.9/37.9 -> 10.7/34.1 and is trending down to 9.5/30.6 -> 8.7/28.4> 6.8/22.1 received 2 units of PRBC on 05/12/2020> 10.4/32.5. -He is having black stools so iron supplementation was stopped.  FOBT positive.  -Gastroenterology were consulted and recently had an EGD and colonoscopy over the last 2 months.  [name] recommends next up in evaluating would be a video capsule endoscopy but it cannot be formed due to his recent  CVA and dysphagia and since his inability swallowed it would have to be done endoscopically and placed an EGD.  Currently endoscopic procedures are high risk with his recent acute CVA and anything small bowel pathology would require a deep enteroscopy or may not be reachable via endoscopically and likely the pathology would be intestinal angiectasia's.  Dr. Sharla KidneyPirtle recommends continue to monitor carefully. 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  If recurrent transfusion needed, will call GI back.  SIRS with Leukocytosis and Fever in the setting of Aspiration Pneumonia, leukocytosis was worsening up until yesterday.  -Likely in the setting of aspiration pneumonia. -CT chest shows bibasilar pneumonia.  CT abdomen pelvis negative.  Culture negative so far.  Last temperature 100.9 at 11 AM on 05/12/2020.  Afebrile since then.  ID thinks that his fever is coming from ICH.  However they added Flagyl to his current regime of Rocephin and Zithromax on 05/13/2020 and all antibiotics were discontinued on 05/15/2020 per ID recommendations.  Leukocytosis improving slowly. fortunately, he has remained afebrile since 05/12/2020.  GERD -C/w Pantorpazole 40 mg per Tube Daily qHS and will increase to twice daily   Hypernatremia/hyperchloremia -Mild hyperchloremia but hyponatremia resolved.  Thrombocytopenia Resolved.  Hyperglycemia  Slightly hyperglycemic.  Will increase Lantus to 15 units  and continue SSI.  DVT prophylaxis: SCDs Code Status: FULL CODE Family Communication: No family present at bedside.  Discussed with his wife over the phone few days ago.  She wants to continue full code.  Palliative care in communication with family.  They want full aggressive care at this point in time. Disposition Plan: Need further clinical improvement back to baseline and anticipate discharge  to SNF once medically stable  Status is: Inpatient  Remains inpatient appropriate because:Altered mental status, Unsafe d/c plan, IV treatments appropriate due to intensity of illness or inability to take PO and Inpatient level of care appropriate due to severity of illness   Dispo: The patient is from: Home              Anticipated d/c is to: CIR vs. SNF              Anticipated d/c date is: 3 days              Patient currently is not medically stable to d/c.  Consultants:   Neurology  Gastroenterology  Case was discussed with infectious diseases Dr. Daiva Eves  Procedures:  ECHOCARDIOGRAM IMPRESSIONS    1. Left ventricular ejection fraction, by estimation, is 60 to 65%. The  left ventricle has normal function. The left ventricle has no regional  wall motion abnormalities. Left ventricular diastolic parameters are  consistent with Grade I diastolic  dysfunction (impaired relaxation).  2. Right ventricular systolic function is normal. The right ventricular  size is normal. Tricuspid regurgitation signal is inadequate for assessing  PA pressure.  3. The mitral valve is normal in structure. No evidence of mitral valve  regurgitation. No evidence of mitral stenosis.  4. The aortic valve is tricuspid. Aortic valve regurgitation is not  visualized. No aortic stenosis is present.  5. The inferior vena cava is normal in size with greater than 50%  respiratory variability, suggesting right atrial pressure of 3 mmHg.   FINDINGS  Left Ventricle: Left ventricular ejection fraction, by estimation, is 60  to 65%. The left ventricle has normal function. The left ventricle has no  regional wall motion abnormalities. The left ventricular internal cavity  size was normal in size. There is  no left ventricular hypertrophy. Left ventricular diastolic parameters  are consistent with Grade I diastolic dysfunction (impaired relaxation).   Right Ventricle: The right ventricular size is  normal. No increase in  right ventricular wall thickness. Right ventricular systolic function is  normal. Tricuspid regurgitation signal is inadequate for assessing PA  pressure.   Left Atrium: Left atrial size was normal in size.   Right Atrium: Right atrial size was normal in size.   Pericardium: There is no evidence of pericardial effusion.   Mitral Valve: The mitral valve is normal in structure. No evidence of  mitral valve regurgitation. No evidence of mitral valve stenosis.   Tricuspid Valve: The tricuspid valve is normal in structure. Tricuspid  valve regurgitation is not demonstrated.   Aortic Valve: The aortic valve is tricuspid. Aortic valve regurgitation is  not visualized. No aortic stenosis is present.   Pulmonic Valve: The pulmonic valve was normal in structure. Pulmonic valve  regurgitation is not visualized.   Aorta: The aortic root is normal in size and structure.   Venous: The inferior vena cava is normal in size with greater than 50%  respiratory variability, suggesting right atrial pressure of 3 mmHg.   IAS/Shunts: No atrial level shunt detected by color flow Doppler.  LEFT VENTRICLE  PLAX 2D  LVIDd:     4.30 cm Diastology  LVIDs:     2.70 cm LV e' medial:  8.49 cm/s  LV PW:     1.00 cm LV E/e' medial: 9.2  LV IVS:    0.90 cm LV e' lateral:  7.46 cm/s  LVOT diam:   2.20 cm LV E/e' lateral: 10.4  LV SV:     98  LV SV Index:  57  LVOT Area:   3.80 cm     RIGHT VENTRICLE  RV S prime:   13.90 cm/s  TAPSE (M-mode): 2.0 cm   LEFT ATRIUM       Index    RIGHT ATRIUM      Index  LA diam:    3.10 cm 1.78 cm/m RA Area:   11.60 cm  LA Vol (A2C):  40.9 ml 23.49 ml/m RA Volume:  24.60 ml 14.13 ml/m  LA Vol (A4C):  37.9 ml 21.77 ml/m  LA Biplane Vol: 43.0 ml 24.69 ml/m  AORTIC VALVE  LVOT Vmax:  120.00 cm/s  LVOT Vmean: 75.400 cm/s  LVOT VTI:  0.259 m    AORTA  Ao Root diam:  3.20 cm   MITRAL VALVE  MV Area (PHT): 2.80 cm  SHUNTS  MV Decel Time: 271 msec  Systemic VTI: 0.26 m  MV E velocity: 77.70 cm/s Systemic Diam: 2.20 cm  MV A velocity: 84.50 cm/s  MV E/A ratio: 0.92   Antimicrobials:  Anti-infectives (From admission, onward)   Start     Dose/Rate Route Frequency Ordered Stop   05/14/20 1000  cefTRIAXone (ROCEPHIN) 2 g in sodium chloride 0.9 % 100 mL IVPB        2 g 200 mL/hr over 30 Minutes Intravenous Daily 05/13/20 1454 05/15/20 1255   05/13/20 2200  metroNIDAZOLE (FLAGYL) tablet 500 mg        500 mg Per Tube Every 8 hours 05/13/20 1847 05/15/20 2219   05/13/20 1400  metroNIDAZOLE (FLAGYL) tablet 500 mg  Status:  Discontinued        500 mg Oral Every 8 hours 05/13/20 0935 05/13/20 1847   05/12/20 0900  cefTRIAXone (ROCEPHIN) 1 g in sodium chloride 0.9 % 100 mL IVPB  Status:  Discontinued        1 g 200 mL/hr over 30 Minutes Intravenous Daily 05/12/20 0817 05/13/20 1454   05/12/20 0900  azithromycin (ZITHROMAX) 500 mg in sodium chloride 0.9 % 250 mL IVPB  Status:  Discontinued        500 mg 250 mL/hr over 60 Minutes Intravenous Daily 05/12/20 0817 05/13/20 0935   05/09/20 0930  Ampicillin-Sulbactam (UNASYN) 3 g in sodium chloride 0.9 % 100 mL IVPB  Status:  Discontinued        3 g 200 mL/hr over 30 Minutes Intravenous Every 8 hours 05/09/20 0837 05/09/20 0839   05/09/20 0930  Ampicillin-Sulbactam (UNASYN) 3 g in sodium chloride 0.9 % 100 mL IVPB  Status:  Discontinued        3 g 200 mL/hr over 30 Minutes Intravenous Every 6 hours 05/09/20 0839 05/12/20 0817        Subjective: Seen and examined.  Slight improvement today.  Patient initially was able to track on the right side but then I attempted second time, he did not track.  Remains almost nonverbal.  Looks comfortable though.  Objective: Vitals:   05/17/20 0332 05/17/20 0333 05/17/20 0750 05/17/20 1121  BP: 125/61  (!) 143/71 Marland Kitchen)  106/57  Pulse: 92  96 89  Resp: Temp:  98.5 F (36.9 C)  98 F (36.7 C) 97.9 F (36.6 C)  TempSrc: Oral  Axillary Axillary  SpO2: 100%  98% 100%  Weight:  65.5 kg      Intake/Output Summary (Last 24 hours) at 05/17/2020 1207 Last data filed at 05/17/2020 0558 Gross per 24 hour  Intake 2355 ml  Output 400 ml  Net 1955 ml   Filed Weights   05/12/20 0434 05/14/20 0500 05/17/20 0333  Weight: 62.5 kg 64.7 kg 65.5 kg   Examination: Physical Exam:  General exam: Appears calm and comfortable  Respiratory system: Clear to auscultation. Respiratory effort normal. Cardiovascular system: S1 & S2 heard, RRR. No JVD, murmurs, rubs, gallops or clicks. No pedal edema. Gastrointestinal system: Abdomen is nondistended, soft and nontender. No organomegaly or masses felt. Normal bowel sounds heard. Central nervous system: Alert but disoriented.   Data Reviewed: I have personally reviewed following labs and imaging studies  CBC: Recent Labs  Lab 05/13/20 1009 05/14/20 0117 05/15/20 0354 05/16/20 0242 05/17/20 0156  WBC 33.5* 29.2* 29.2* 25.0* 22.4*  NEUTROABS 24.8* 21.7* 21.7* 18.1* 16.3*  HGB 8.8* 8.8* 8.3* 7.7* 7.4*  HCT 27.5* 27.6* 26.3* 25.4* 24.5*  MCV 89.6 91.4 93.3 98.4 97.2  PLT 232 252 287 268 313   Basic Metabolic Panel: Recent Labs  Lab 05/11/20 0637 05/11/20 0637 05/12/20 0510 05/13/20 1009 05/14/20 0117 05/15/20 0354 05/16/20 0242  NA 146*   < > 145 149* 147* 153* 151*  K 4.1   < > 3.8 4.0 4.0 4.2 4.1  CL 116*   < > 115* 118* 119* 122* 120*  CO2 19*   < > 22 19* 21* 20* 21*  GLUCOSE 157*   < > 205* 154* 169* 171* 151*  BUN 21   < > 25* 26* 31* 35* 33*  CREATININE 0.91   < > 1.08 1.08 1.08 1.25* 1.16  CALCIUM 8.9   < > 8.5* 8.7* 9.0 9.1 8.9  MG 2.1  --  2.3 2.4  --   --   --   PHOS 3.0  --  3.1  --   --   --   --    < > = values in this interval not displayed.   GFR: Estimated Creatinine Clearance: 58.1 mL/min (by C-G formula based on SCr of 1.16 mg/dL). Liver Function Tests: Recent Labs    Lab 05/11/20 0637 05/12/20 0510 05/16/20 0242  AST 90* 118* 106*  ALT 78* 86* 116*  ALKPHOS 71 58 113  BILITOT 0.7 0.7 0.7  PROT 7.8 6.7 7.2  ALBUMIN 2.6* 2.2* 2.2*   No results for input(s): LIPASE, AMYLASE in the last 168 hours. No results for input(s): AMMONIA in the last 168 hours. Coagulation Profile: No results for input(s): INR, PROTIME in the last 168 hours. Cardiac Enzymes: No results for input(s): CKTOTAL, CKMB, CKMBINDEX, TROPONINI in the last 168 hours. BNP (last 3 results) No results for input(s): PROBNP in the last 8760 hours. HbA1C: No results for input(s): HGBA1C in the last 72 hours. CBG: Recent Labs  Lab 05/16/20 1939 05/16/20 2349 05/17/20 0418 05/17/20 0748 05/17/20 1142  GLUCAP 131* 129* 135* 143* 127*   Lipid Profile: No results for input(s): CHOL, HDL, LDLCALC, TRIG, CHOLHDL, LDLDIRECT in the last 72 hours. Thyroid Function Tests: No results for input(s): TSH, T4TOTAL, FREET4, T3FREE, THYROIDAB in the last 72 hours. Anemia Panel: No results for  input(s): VITAMINB12, FOLATE, FERRITIN, TIBC, IRON, RETICCTPCT in the last 72 hours. Sepsis Labs: Recent Labs  Lab 05/12/20 0510  PROCALCITON 0.21    Recent Results (from the past 240 hour(s))  Culture, blood (Routine X 2) w Reflex to ID Panel     Status: None   Collection Time: 05/07/20  7:59 PM   Specimen: BLOOD LEFT HAND  Result Value Ref Range Status   Specimen Description BLOOD LEFT HAND  Final   Special Requests   Final    BOTTLES DRAWN AEROBIC AND ANAEROBIC Blood Culture adequate volume   Culture   Final    NO GROWTH 5 DAYS Performed at [facility], [address], [place], [place] [address]    Report Status 05/12/2020 FINAL  Final  Culture, blood (Routine X 2) w Reflex to ID Panel     Status: None   Collection Time: 05/07/20  8:06 PM   Specimen: BLOOD RIGHT HAND  Result Value Ref Range Status   Specimen Description BLOOD RIGHT HAND  Final   Special Requests   Final     BOTTLES DRAWN AEROBIC AND ANAEROBIC Blood Culture adequate volume   Culture   Final    NO GROWTH 5 DAYS Performed at [facility], [address], [place], [place] [address]    Report Status 05/12/2020 FINAL  Final  Culture, blood (routine x 2)     Status: None   Collection Time: 05/09/20  8:27 AM   Specimen: BLOOD  Result Value Ref Range Status   Specimen Description BLOOD RIGHT ANTECUBITAL  Final   Special Requests   Final    AEROBIC BOTTLE ONLY Blood Culture results may not be optimal due to an inadequate volume of blood received in culture bottles   Culture   Final    NO GROWTH 5 DAYS Performed at [facility], [address] [place], [place] [address]    Report Status 05/14/2020 FINAL  Final  Culture, blood (routine x 2)     Status: None   Collection Time: 05/09/20  8:27 AM   Specimen: BLOOD  Result Value Ref Range Status   Specimen Description BLOOD RIGHT ANTECUBITAL  Final   Special Requests   Final    AEROBIC BOTTLE ONLY Blood Culture results may not be optimal due to an inadequate volume of blood received in culture bottles   Culture   Final    NO GROWTH 5 DAYS Performed at [facility], [address], [place], [place] [address]    Report Status 05/14/2020 FINAL  Final  Culture, Urine     Status: Abnormal   Collection Time: 05/09/20  8:40 AM   Specimen: Urine, Random  Result Value Ref Range Status   Specimen Description URINE, RANDOM  Final   Special Requests   Final    NONE Performed at [facility], [address] [place], [place] [address]    Culture >=100,000 COLONIES/mL KLEBSIELLA PNEUMONIAE (A)  Final   Report Status 05/11/2020 FINAL  Final   Organism ID, Bacteria KLEBSIELLA PNEUMONIAE (A)  Final      Susceptibility   Klebsiella pneumoniae - MIC*    AMPICILLIN RESISTANT Resistant     CEFAZOLIN <=4 SENSITIVE Sensitive     CEFTRIAXONE <=0.25 SENSITIVE Sensitive     CIPROFLOXACIN <=0.25 SENSITIVE Sensitive     GENTAMICIN <=1  SENSITIVE Sensitive     IMIPENEM <=0.25 SENSITIVE Sensitive     NITROFURANTOIN 32 SENSITIVE Sensitive  TRIMETH/SULFA <=20 SENSITIVE Sensitive     AMPICILLIN/SULBACTAM 4 SENSITIVE Sensitive     PIP/TAZO <=4 SENSITIVE Sensitive     * >=100,000 COLONIES/mL KLEBSIELLA PNEUMONIAE     RN Pressure Injury Documentation:     Estimated body mass index is 23.31 kg/m as calculated from the following:   Height as of 04/09/20: 5\' 6"  (1.676 m).   Weight as of this encounter: 65.5 kg.  Malnutrition Type:  Nutrition Problem: Inadequate oral intake Etiology: dysphagia, acute illness   Malnutrition Characteristics:  Signs/Symptoms: NPO status   Nutrition Interventions:  Interventions: Tube feeding     Radiology Studies: No results found. Scheduled Meds: . amLODipine  10 mg Per Tube Daily  . chlorhexidine  15 mL Mouth Rinse BID  . free water  150 mL Per Tube Q6H  . insulin aspart  0-9 Units Subcutaneous Q4H  . insulin glargine  15 Units Subcutaneous Daily  . lisinopril  20 mg Per Tube BID  . mouth rinse  15 mL Mouth Rinse q12n4p  . metoprolol tartrate  25 mg Per Tube BID  . pantoprazole sodium  40 mg Per Tube BID  . senna-docusate  1 tablet Per Tube BID  . sodium chloride flush  10-40 mL Intracatheter Q12H   Continuous Infusions: . feeding supplement (OSMOLITE 1.2 CAL) 1,000 mL (05/17/20 0309)    LOS: 12 days   05/19/20, MD [facility] PAGER is on AMION  If 7PM-7AM, please contact night-coverage [email or web address]

## 2020-05-18 ENCOUNTER — Inpatient Hospital Stay (HOSPITAL_COMMUNITY): Payer: Medicaid Other

## 2020-05-18 LAB — GLUCOSE, CAPILLARY
Glucose-Capillary: 122 mg/dL — ABNORMAL HIGH (ref 70–99)
Glucose-Capillary: 125 mg/dL — ABNORMAL HIGH (ref 70–99)
Glucose-Capillary: 130 mg/dL — ABNORMAL HIGH (ref 70–99)
Glucose-Capillary: 136 mg/dL — ABNORMAL HIGH (ref 70–99)
Glucose-Capillary: 137 mg/dL — ABNORMAL HIGH (ref 70–99)
Glucose-Capillary: 146 mg/dL — ABNORMAL HIGH (ref 70–99)
Glucose-Capillary: 147 mg/dL — ABNORMAL HIGH (ref 70–99)

## 2020-05-18 LAB — COMPREHENSIVE METABOLIC PANEL
ALT: 124 U/L — ABNORMAL HIGH (ref 0–44)
AST: 115 U/L — ABNORMAL HIGH (ref 15–41)
Albumin: 2.2 g/dL — ABNORMAL LOW (ref 3.5–5.0)
Alkaline Phosphatase: 149 U/L — ABNORMAL HIGH (ref 38–126)
Anion gap: 7 (ref 5–15)
BUN: 30 mg/dL — ABNORMAL HIGH (ref 8–23)
CO2: 23 mmol/L (ref 22–32)
Calcium: 9.1 mg/dL (ref 8.9–10.3)
Chloride: 118 mmol/L — ABNORMAL HIGH (ref 98–111)
Creatinine, Ser: 1.01 mg/dL (ref 0.61–1.24)
GFR, Estimated: >60 mL/min (ref >60)
Glucose, Bld: 147 mg/dL — ABNORMAL HIGH (ref 70–99)
Potassium: 4.1 mmol/L (ref 3.5–5.1)
Sodium: 148 mmol/L — ABNORMAL HIGH (ref 135–145)
Total Bilirubin: 0.8 mg/dL (ref 0.3–1.2)
Total Protein: 7.3 g/dL (ref 6.5–8.1)

## 2020-05-18 LAB — CBC WITH DIFFERENTIAL/PLATELET
Abs Immature Granulocytes: 1.45 10*3/uL — ABNORMAL HIGH (ref 0.00–0.07)
Basophils Absolute: 0.1 10*3/uL (ref 0.0–0.1)
Basophils Relative: 0 %
Eosinophils Absolute: 0.9 10*3/uL — ABNORMAL HIGH (ref 0.0–0.5)
Eosinophils Relative: 4 %
HCT: 26.6 % — ABNORMAL LOW (ref 39.0–52.0)
Hemoglobin: 7.9 g/dL — ABNORMAL LOW (ref 13.0–17.0)
Immature Granulocytes: 7 %
Lymphocytes Relative: 9 %
Lymphs Abs: 2 10*3/uL (ref 0.7–4.0)
MCH: 28.8 pg (ref 26.0–34.0)
MCHC: 29.7 g/dL — ABNORMAL LOW (ref 30.0–36.0)
MCV: 97.1 fL (ref 80.0–100.0)
Monocytes Absolute: 1.7 10*3/uL — ABNORMAL HIGH (ref 0.1–1.0)
Monocytes Relative: 8 %
Neutro Abs: 15.6 10*3/uL — ABNORMAL HIGH (ref 1.7–7.7)
Neutrophils Relative %: 72 %
Platelets: 304 10*3/uL (ref 150–400)
RBC: 2.74 MIL/uL — ABNORMAL LOW (ref 4.22–5.81)
RDW: 18.1 % — ABNORMAL HIGH (ref 11.5–15.5)
WBC: 21.5 10*3/uL — ABNORMAL HIGH (ref 4.0–10.5)
nRBC: 0.3 % — ABNORMAL HIGH (ref 0.0–0.2)

## 2020-05-18 LAB — MAGNESIUM: Magnesium: 2.5 mg/dL — ABNORMAL HIGH (ref 1.7–2.4)

## 2020-05-18 NOTE — Progress Notes (Signed)
Occupational Therapy Treatment Patient Details Name: Alan Everett MRN: [id number] DOB: [birth year] Today's Date: 05/18/2020    History of present illness Patient is a 64 y/o male with PMH ETOH use, tobacco abuse, admitted with R side weakness,sensory loss and speech deficits.  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   OT comments  Pt tolerated EOB sitting and sit<> stand x2 this session total +2 Max (A). Pt fatigued after session and sound asleep in the bed. Pt sustained visual attention the entire session. Recommendation SNF for d/c planning.   Follow Up Recommendations  SNF    Equipment Recommendations  Wheelchair (measurements OT);Wheelchair cushion (measurements OT);Hospital bed (lift)    Recommendations for Other Services Other (comment) (palliative)    Precautions / Restrictions Precautions Precautions: Fall Precaution Comments: soft cuff restraint L wrist, pushes to Rt, coretrack, BP <180       Mobility Bed Mobility Overal bed mobility: Needs Assistance Bed Mobility: Supine to Sit;Sit to Supine     Supine to sit: +2 for physical assistance;Max assist Sit to supine: +2 for physical assistance;Max assist   General bed mobility comments: pt static sitting mod-Max (A) for balance with c curved spine toward L side. L neck rotation toward L  Transfers Overall transfer level: Needs assistance Equipment used: 2 person hand held assist Transfers: Sit to/from Stand Sit to Stand: +2 physical assistance;Max assist;From elevated surface         General transfer comment: requires complete blocking R LE and (A) for elongated spine    Balance Overall balance assessment: Needs assistance Sitting-balance support: Single extremity supported;Feet supported Sitting balance-Leahy Scale: Poor Sitting balance - Comments: reliant on therapist (A) positioned R UE for weight bearing needed (A) to attempt neck rotation  toward neutral spine and visual attention to midline. pt pulling and picking at bed sheets with L UE   Standing balance support: Single extremity supported;During functional activity Standing balance-Leahy Scale: Zero Standing balance comment: dependent on therapists                           ADL either performed or assessed with clinical judgement   ADL Overall ADL's : Needs assistance/impaired Eating/Feeding: NPO Eating/Feeding Details (indicate cue type and reason): attempting to touch cortak once during session and cued to not touch Grooming: Total assistance   Upper Body Bathing: Total assistance   Lower Body Bathing: Total assistance   Upper Body Dressing : Total assistance   Lower Body Dressing: Total assistance                       Vision       Perception     Praxis      Cognition Arousal/Alertness: Awake/alert Behavior During Therapy: Restless Overall Cognitive Status: Difficult to assess                                 General Comments: not following any commands, pt does seem to respond to long time friend Stanton Kidney in room with name call with head slight lift, no verbalizations        Exercises     Shoulder Instructions       General Comments      Pertinent Vitals/ Pain       Pain Assessment: No/denies pain Faces Pain Scale: No hurt  Home Living  Prior Functioning/Environment              Frequency  Min 2X/week        Progress Toward Goals  OT Goals(current goals can now be found in the care plan section)  Progress towards OT goals: Progressing toward goals  Acute Rehab OT Goals Patient Stated Goal: unable to state OT Goal Formulation: Patient unable to participate in goal setting Time For Goal Achievement: 06/01/20 Potential to Achieve Goals: Fair ADL Goals Pt Will Perform Grooming: with min assist;sitting Pt Will Transfer to Toilet:  with min assist;stand pivot transfer;squat pivot transfer;bedside commode Pt/caregiver will Perform Home Exercise Program:  (tolerate soft elbow splint on R UE to help with tone contrac) Additional ADL Goal #1: Pt will maintain eyes open for 50% of session (met) Additional ADL Goal #2: Pt will be able to track right and left 5 times in succesion with increased time Additional ADL Goal #3: Pt will follow 3 out of 5 one step commands Additional ADL Goal #4: Pt will be able to sit EOB with minguard A in prep for transfers  Plan Discharge plan remains appropriate    Co-evaluation    PT/OT/SLP Co-Evaluation/Treatment: Yes Reason for Co-Treatment: To address functional/ADL transfers;For patient/therapist safety;Necessary to address cognition/behavior during functional activity   OT goals addressed during session: ADL's and self-care;Strengthening/ROM;Proper use of Adaptive equipment and DME      AM-PAC OT "6 Clicks" Daily Activity     Outcome Measure   Help from another person eating meals?: Total Help from another person taking care of personal grooming?: Total Help from another person toileting, which includes using toliet, bedpan, or urinal?: Total Help from another person bathing (including washing, rinsing, drying)?: Total Help from another person to put on and taking off regular upper body clothing?: Total Help from another person to put on and taking off regular lower body clothing?: Total 6 Click Score: 6    End of Session    OT Visit Diagnosis: Other abnormalities of gait and mobility (R26.89);Muscle weakness (generalized) (M62.81);Low vision, both eyes (H54.2);Other symptoms and signs involving cognitive function;Hemiplegia and hemiparesis Hemiplegia - Right/Left: Right Hemiplegia - dominant/non-dominant: Dominant   Activity Tolerance Patient tolerated treatment well   Patient Left in bed;with call bell/phone within reach;with bed alarm set;with restraints reapplied;with  nursing/sitter in room;with family/visitor present   Nurse Communication Mobility status;Precautions        Time: 6153-7943 OT Time Calculation (min): 22 min  Charges: OT General Charges $OT Visit: 1 Visit   Fleeta Emmer, OTR/L  Acute Rehabilitation Services Pager: [phone] Office: [phone] .    Jeri Modena 05/18/2020, 12:09 PM

## 2020-05-18 NOTE — Progress Notes (Addendum)
PROGRESS NOTE    Alan Everett  DJS:[id number] DOB: 1955-11-23 DOA: 05/05/2020 PCP: Patient, No Pcp Per   Brief Narrative:  HPI per Dr. Caryl Everett on 05/05/20 Alan Everett is an 64 y.o. male with a PMHx of alcohol use and tobacco abuse who presented to the ED via EMS as a Code Stroke for acute onset of right sided weakness. he also had sensory loss and weakness involving his RUE and RLE. 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 Neurosurgery was consulted for EVD consideration which was then deferred.  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  Transferred under [facility] on 05/09/2020.  On 05/08/2020, patient spiked fever and he was started on broad-spectrum antibiotics/IV Zosyn empirically.  Despite of antibiotics, patient's white blood cells as well as fever continue to get worse with a T-max of 103.2 at around noon on 05/11/2020.  Previous hospitalist Dr. Richardson Everett had discussed case with ID who recommended scanning his chest and abdomen and continuing antibiotics.  CT abdomen chest and pelvis shows bibasilar infiltrates suspecting possible pneumonia but no other pathology. 2D Echo EF 60-65%. UDS positive for cocaine  CT head: 1. 2.3 x 2.1 x 2.9 cm acute intraparenchymal hemorrhage centered at the left thalamic capsular region, estimated volume 7 cc. Minimal surrounding vasogenic edema without significant regional mass effect or midline shift. Associated intraventricular extension with small volume intraventricular blood within the adjacent left lateral ventricle. No hydrocephalus or ventricular trapping. 2. No other acute intracranial abnormality. 3. Underlying age-related cerebral atrophy with mild chronic small vessel ischemic disease.    His oral iron was stopped yesterday and GI was consulted given concern of positive FOBT and drop in hemoglobin.  He is at high risk for  getting an EGD in the setting of his acute CVA and lack of overt bleeding.  They are recommending cross-sectional imaging to exclude large GI lesions within the small bowel.  They recommend if patient has progressive anemia, transfusion dependent or requirements they will consider an EGD with VCE placement given that he is unable to swallow.  Dr. Myrene Everett believes that he could have small bowel pathology and could be having some angiectasia's that have been bleeding given that he had recently had an EGD and colon a few months ago.  Assessment & Plan:   Active Problems:   ICH (intracerebral hemorrhage) (HCC)   SIRS (systemic inflammatory response syndrome) (HCC)   Polysubstance abuse (HCC)   Dysphagia, post-stroke   Hyperglycemia   Hypokalemia   Hypomagnesemia   Fever   SOB (shortness of breath)   Leukocytosis   Aspiration pneumonia of both lower lobes (HCC)   Palliative care by specialist   Goals of care, counseling/discussion   DNR (do not resuscitate) discussion  Acute Hemorrhagic Stroke/cerebral edema - L BG ICH w/ IVH d/t severe hypertension in setting of alcohol and cocaine use -Code Stroke CT head 10/6 showed L thalamocapsular IPH 7cc w/ minimal edema, mild IVH. Small vessel disease. Atrophy.  -CT head 10/6 2042 enlarging L thalamocapsular IPH w/ increasing IVH from L ventricle into 3rd and 4th ventricles. -CT head 10/7 0510 stable L thalamic IPH w/ IVH w/ clearing of blood in 4th ventricle.  -CTA head and neck 10/8 unremarkable angio, slight increase in midline shift and ventriculomegaly -MRI not able to perform due to no family contact to clear for metal -Repeat CT head 10/9 suboptimal but stable -2D Echo  EF 60-65% -UDS positive for cocaine -No antithrombotic prior to admission, now on No antithrombotic given IPH -Therapy recommendations:  CIR vs SNF and will go to CIR once more medically stable Neurology felt that his exam is essentially unchanged and recommending CIR when  medically stable for discharge.  Repeat CT head on 05/12/2020 essentially showed stable left thalamic hemorrhage and intraventricular extension since 05/08/2020.  Regional edema, mild regional mass-effect and mild lateral ventriculomegaly and trace SAH.  Neurology signed off on 05/14/2020.  Hypertensive Emergency -Per EMS, SBP > 200  -Home meds:  none -Weaned off off Cleviprex. -Now On Amlodipine 10 and lisinopril 20 bid and as needed IV hydralazine.  Blood pressure mostly controlled with intermittent tachycardia and thus Lopressor was added which I will continue. -SBP goal < 160 -Long-term BP goal normotensive;  Hyperlipidemia -Home meds:  None -LDL 80, goal < 70 -No statin in setting of IPH -Mild transaminitis, likely related to alcohol, AST/ALT 68/64 ->52/47 -> 35/45 -> and is Now worsening and is 90/78 respectively -Will Consider statin low dose on discharge with normalized liver enzymes  Dysphagia -Secondary to stroke -C/w NPO; SLP on board.  Patient on tube feedings at 65 mL. cortrak in place. SLP managing.  Discussed with SLP this morning.  MBS planned for today.  Addendum: Received word from SLP that patient's MBS was terrible and he has failed it.  They recommend PEG tube placement.  I discussed this with the patient's niece [name].  She opted for that as well.  I have consulted IR for PEG tube placement.   Tobacco Abuse -Patient is a current smoker and will need smoking cessation counseling given once he is more awake  Cocaine abuse -UDS positive for cocaine -Cocaine cessation will be provided when he is more awake -Avoid beta-blocker  Hypophosphatemia Resolved.  Hypokalemia Resolved.  Normocytic Anemia -Patient's Hgb/Hct trending down from 12.0/38.3 -> 11.9/37.9 -> 10.7/34.1 and is trending down to 9.5/30.6 -> 8.7/28.4> 6.8/22.1 received 2 units of PRBC on 05/12/2020> 10.4/32.5. -He is having black stools so iron supplementation was stopped.  FOBT positive.   -Gastroenterology were consulted and recently had an EGD and colonoscopy over the last 2 months.  [name] recommends next up in evaluating would be a video capsule endoscopy but it cannot be formed due to his recent CVA and dysphagia and since his inability swallowed it would have to be done endoscopically and placed an EGD.  Currently endoscopic procedures are high risk with his recent acute CVA and anything small bowel pathology would require a deep enteroscopy or may not be reachable via endoscopically and likely the pathology would be intestinal angiectasia's.  Dr. Sharla KidneyPirtle recommends continue to monitor carefully. 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  If recurrent transfusion needed, will call GI back.  SIRS with Leukocytosis and Fever in the setting of Aspiration Pneumonia, leukocytosis was worsening up until yesterday.  -Likely in the setting of aspiration pneumonia. -CT chest shows bibasilar pneumonia.  CT abdomen pelvis negative.  Culture negative so far.  Last temperature 100.9 at 11 AM on 05/12/2020.  Afebrile since then.  ID thinks that his fever is coming from ICH.  However they added Flagyl to his current regime of Rocephin and Zithromax on 05/13/2020 and all antibiotics were discontinued on 05/15/2020 per ID recommendations.  Leukocytosis improving slowly. fortunately, he has remained afebrile since 05/12/2020.  GERD -C/w Pantorpazole 40 mg per Tube Daily qHS and will increase to twice daily   Hypernatremia/hyperchloremia -Mild hyperchloremia but hyponatremia resolved.  Thrombocytopenia Resolved.  Hyperglycemia  Slightly hyperglycemic.  Will increase Lantus to 15 units and continue SSI.  DVT prophylaxis: SCDs Code Status: FULL CODE Family Communication: No family present at bedside.  Discussed with his wife over the phone few days ago.  She wants  to continue full code.  Palliative care in communication with family.  They want full aggressive care at this point in time.  I spoke to his niece as mentioned above. Disposition Plan: At this point in time, I do not expect much improvement in his neurological status.  Prognosis remains poor.  Family is aware but wants aggressive care.  Currently on tube feedings.  SLP managing.  MBS pending.  Only hurdle and discharged to SNF at this point in time is if this gentleman is going to need PEG tube before discharge.  Per palliative care note, family would like that if needed.  We will know more once MBS is done.  Status is: Inpatient  Remains inpatient appropriate because:Altered mental status, Unsafe d/c plan, IV treatments appropriate due to intensity of illness or inability to take PO and Inpatient level of care appropriate due to severity of illness   Dispo: The patient is from: Home              Anticipated d/c is to: CIR vs. SNF              Anticipated d/c date is: 3 days              Patient currently is not medically stable to d/c.  Consultants:   Neurology  Gastroenterology  Case was discussed with infectious diseases Dr. Daiva Eves  Procedures:  ECHOCARDIOGRAM IMPRESSIONS    1. Left ventricular ejection fraction, by estimation, is 60 to 65%. The  left ventricle has normal function. The left ventricle has no regional  wall motion abnormalities. Left ventricular diastolic parameters are  consistent with Grade I diastolic  dysfunction (impaired relaxation).  2. Right ventricular systolic function is normal. The right ventricular  size is normal. Tricuspid regurgitation signal is inadequate for assessing  PA pressure.  3. The mitral valve is normal in structure. No evidence of mitral valve  regurgitation. No evidence of mitral stenosis.  4. The aortic valve is tricuspid. Aortic valve regurgitation is not  visualized. No aortic stenosis is present.  5. The inferior vena cava  is normal in size with greater than 50%  respiratory variability, suggesting right atrial pressure of 3 mmHg.   FINDINGS  Left Ventricle: Left ventricular ejection fraction, by estimation, is 60  to 65%. The left ventricle has normal function. The left ventricle has no  regional wall motion abnormalities. The left ventricular internal cavity  size was normal in size. There is  no left ventricular hypertrophy. Left ventricular diastolic parameters  are consistent with Grade I diastolic dysfunction (impaired relaxation).   Right Ventricle: The right ventricular size is normal. No increase in  right ventricular wall thickness. Right ventricular systolic function is  normal. Tricuspid regurgitation signal is inadequate for assessing PA  pressure.   Left Atrium: Left atrial size was normal in size.   Right Atrium: Right atrial size was normal in size.   Pericardium: There is no evidence of pericardial effusion.   Mitral Valve: The mitral valve is normal in structure. No evidence of  mitral valve regurgitation.  No evidence of mitral valve stenosis.   Tricuspid Valve: The tricuspid valve is normal in structure. Tricuspid  valve regurgitation is not demonstrated.   Aortic Valve: The aortic valve is tricuspid. Aortic valve regurgitation is  not visualized. No aortic stenosis is present.   Pulmonic Valve: The pulmonic valve was normal in structure. Pulmonic valve  regurgitation is not visualized.   Aorta: The aortic root is normal in size and structure.   Venous: The inferior vena cava is normal in size with greater than 50%  respiratory variability, suggesting right atrial pressure of 3 mmHg.   IAS/Shunts: No atrial level shunt detected by color flow Doppler.     LEFT VENTRICLE  PLAX 2D  LVIDd:     4.30 cm Diastology  LVIDs:     2.70 cm LV e' medial:  8.49 cm/s  LV PW:     1.00 cm LV E/e' medial: 9.2  LV IVS:    0.90 cm LV e' lateral:  7.46 cm/s  LVOT  diam:   2.20 cm LV E/e' lateral: 10.4  LV SV:     98  LV SV Index:  57  LVOT Area:   3.80 cm     RIGHT VENTRICLE  RV S prime:   13.90 cm/s  TAPSE (M-mode): 2.0 cm   LEFT ATRIUM       Index    RIGHT ATRIUM      Index  LA diam:    3.10 cm 1.78 cm/m RA Area:   11.60 cm  LA Vol (A2C):  40.9 ml 23.49 ml/m RA Volume:  24.60 ml 14.13 ml/m  LA Vol (A4C):  37.9 ml 21.77 ml/m  LA Biplane Vol: 43.0 ml 24.69 ml/m  AORTIC VALVE  LVOT Vmax:  120.00 cm/s  LVOT Vmean: 75.400 cm/s  LVOT VTI:  0.259 m    AORTA  Ao Root diam: 3.20 cm   MITRAL VALVE  MV Area (PHT): 2.80 cm  SHUNTS  MV Decel Time: 271 msec  Systemic VTI: 0.26 m  MV E velocity: 77.70 cm/s Systemic Diam: 2.20 cm  MV A velocity: 84.50 cm/s  MV E/A ratio: 0.92   Antimicrobials:  Anti-infectives (From admission, onward)   Start     Dose/Rate Route Frequency Ordered Stop   05/14/20 1000  cefTRIAXone (ROCEPHIN) 2 g in sodium chloride 0.9 % 100 mL IVPB        2 g 200 mL/hr over 30 Minutes Intravenous Daily 05/13/20 1454 05/15/20 1255   05/13/20 2200  metroNIDAZOLE (FLAGYL) tablet 500 mg        500 mg Per Tube Every 8 hours 05/13/20 1847 05/15/20 2219   05/13/20 1400  metroNIDAZOLE (FLAGYL) tablet 500 mg  Status:  Discontinued        500 mg Oral Every 8 hours 05/13/20 0935 05/13/20 1847   05/12/20 0900  cefTRIAXone (ROCEPHIN) 1 g in sodium chloride 0.9 % 100 mL IVPB  Status:  Discontinued        1 g 200 mL/hr over 30 Minutes Intravenous Daily 05/12/20 0817 05/13/20 1454   05/12/20 0900  azithromycin (ZITHROMAX) 500 mg in sodium chloride 0.9 % 250 mL IVPB  Status:  Discontinued        500 mg 250 mL/hr over 60 Minutes Intravenous Daily 05/12/20 0817 05/13/20 0935   05/09/20 0930  Ampicillin-Sulbactam (UNASYN) 3 g in sodium chloride 0.9 % 100 mL IVPB  Status:  Discontinued        3 g 200 mL/hr over 30  Minutes Intravenous Every 8 hours 05/09/20 0837 05/09/20 0839   05/09/20  0930  Ampicillin-Sulbactam (UNASYN) 3 g in sodium chloride 0.9 % 100 mL IVPB  Status:  Discontinued        3 g 200 mL/hr over 30 Minutes Intravenous Every 6 hours 05/09/20 0839 05/12/20 0817        Subjective: Patient seen and examined.  Status quo.  Laying on the left side.  Favors left side.  Does not track at all.  Very dysarthric.  Completely disoriented as he has been for the last several days.  Objective: Vitals:   05/18/20 0008 05/18/20 0358 05/18/20 0729 05/18/20 1131  BP: 117/63 136/67 (!) 119/54 105/64  Pulse: 88 (!) 109 99 93  Resp: 17 17 18 18   Temp: 99.2 F (37.3 C) 97.8 F (36.6 C) 99.4 F (37.4 C) 98.5 F (36.9 C)  TempSrc: Oral Oral Oral Oral  SpO2: 100% 100% 100% 99%  Weight:        Intake/Output Summary (Last 24 hours) at 05/18/2020 1304 Last data filed at 05/18/2020 0900 Gross per 24 hour  Intake 60 ml  Output 2350 ml  Net -2290 ml   Filed Weights   05/12/20 0434 05/14/20 0500 05/17/20 0333  Weight: 62.5 kg 64.7 kg 65.5 kg   Examination: Physical Exam:  General exam: Appears calm and comfortable and disoriented Respiratory system: Clear to auscultation. Respiratory effort normal. Cardiovascular system: S1 & S2 heard, RRR. No JVD, murmurs, rubs, gallops or clicks. No pedal edema. Gastrointestinal system: Abdomen is nondistended, soft and nontender. No organomegaly or masses felt. Normal bowel sounds heard. Central nervous system: Lethargic and disoriented.  Not following any commands for me to complete neuro exam.  Data Reviewed: I have personally reviewed following labs and imaging studies  CBC: Recent Labs  Lab 05/14/20 0117 05/15/20 0354 05/16/20 0242 05/17/20 0156 05/18/20 0309  WBC 29.2* 29.2* 25.0* 22.4* 21.5*  NEUTROABS 21.7* 21.7* 18.1* 16.3* 15.6*  HGB 8.8* 8.3* 7.7* 7.4* 7.9*  HCT 27.6* 26.3* 25.4* 24.5* 26.6*  MCV 91.4 93.3 98.4 97.2 97.1  PLT 252 287 268 313 304   Basic Metabolic Panel: Recent Labs  Lab 05/12/20 0510  05/12/20 0510 05/13/20 1009 05/14/20 0117 05/15/20 0354 05/16/20 0242 05/18/20 0309  NA 145   < > 149* 147* 153* 151* 148*  K 3.8   < > 4.0 4.0 4.2 4.1 4.1  CL 115*   < > 118* 119* 122* 120* 118*  CO2 22   < > 19* 21* 20* 21* 23  GLUCOSE 205*   < > 154* 169* 171* 151* 147*  BUN 25*   < > 26* 31* 35* 33* 30*  CREATININE 1.08   < > 1.08 1.08 1.25* 1.16 1.01  CALCIUM 8.5*   < > 8.7* 9.0 9.1 8.9 9.1  MG 2.3  --  2.4  --   --   --  2.5*  PHOS 3.1  --   --   --   --   --   --    < > = values in this interval not displayed.   GFR: Estimated Creatinine Clearance: 66.7 mL/min (by C-G formula based on SCr of 1.01 mg/dL). Liver Function Tests: Recent Labs  Lab 05/12/20 0510 05/16/20 0242 05/18/20 0309  AST 118* 106* 115*  ALT 86* 116* 124*  ALKPHOS 58 113 149*  BILITOT 0.7 0.7 0.8  PROT 6.7 7.2 7.3  ALBUMIN 2.2* 2.2* 2.2*   No results for input(s): LIPASE,  AMYLASE in the last 168 hours. No results for input(s): AMMONIA in the last 168 hours. Coagulation Profile: No results for input(s): INR, PROTIME in the last 168 hours. Cardiac Enzymes: No results for input(s): CKTOTAL, CKMB, CKMBINDEX, TROPONINI in the last 168 hours. BNP (last 3 results) No results for input(s): PROBNP in the last 8760 hours. HbA1C: No results for input(s): HGBA1C in the last 72 hours. CBG: Recent Labs  Lab 05/17/20 1954 05/18/20 0009 05/18/20 0357 05/18/20 0725 05/18/20 1129  GLUCAP 137* 125* 122* 136* 137*   Lipid Profile: No results for input(s): CHOL, HDL, LDLCALC, TRIG, CHOLHDL, LDLDIRECT in the last 72 hours. Thyroid Function Tests: No results for input(s): TSH, T4TOTAL, FREET4, T3FREE, THYROIDAB in the last 72 hours. Anemia Panel: No results for input(s): VITAMINB12, FOLATE, FERRITIN, TIBC, IRON, RETICCTPCT in the last 72 hours. Sepsis Labs: Recent Labs  Lab 05/12/20 0510  PROCALCITON 0.21    Recent Results (from the past 240 hour(s))  Culture, blood (routine x 2)     Status: None    Collection Time: 05/09/20  8:27 AM   Specimen: BLOOD  Result Value Ref Range Status   Specimen Description BLOOD RIGHT ANTECUBITAL  Final   Special Requests   Final    AEROBIC BOTTLE ONLY Blood Culture results may not be optimal due to an inadequate volume of blood received in culture bottles   Culture   Final    NO GROWTH 5 DAYS Performed at [facility], [address] [place], [place] [address]    Report Status 05/14/2020 FINAL  Final  Culture, blood (routine x 2)     Status: None   Collection Time: 05/09/20  8:27 AM   Specimen: BLOOD  Result Value Ref Range Status   Specimen Description BLOOD RIGHT ANTECUBITAL  Final   Special Requests   Final    AEROBIC BOTTLE ONLY Blood Culture results may not be optimal due to an inadequate volume of blood received in culture bottles   Culture   Final    NO GROWTH 5 DAYS Performed at [facility], [address], [place], [place] [address]    Report Status 05/14/2020 FINAL  Final  Culture, Urine     Status: Abnormal   Collection Time: 05/09/20  8:40 AM   Specimen: Urine, Random  Result Value Ref Range Status   Specimen Description URINE, RANDOM  Final   Special Requests   Final    NONE Performed at [facility], [address] [place], [place] [address]    Culture >=100,000 COLONIES/mL KLEBSIELLA PNEUMONIAE (A)  Final   Report Status 05/11/2020 FINAL  Final   Organism ID, Bacteria KLEBSIELLA PNEUMONIAE (A)  Final      Susceptibility   Klebsiella pneumoniae - MIC*    AMPICILLIN RESISTANT Resistant     CEFAZOLIN <=4 SENSITIVE Sensitive     CEFTRIAXONE <=0.25 SENSITIVE Sensitive     CIPROFLOXACIN <=0.25 SENSITIVE Sensitive     GENTAMICIN <=1 SENSITIVE Sensitive     IMIPENEM <=0.25 SENSITIVE Sensitive     NITROFURANTOIN 32 SENSITIVE Sensitive     TRIMETH/SULFA <=20 SENSITIVE Sensitive     AMPICILLIN/SULBACTAM 4 SENSITIVE Sensitive     PIP/TAZO <=4 SENSITIVE Sensitive     * >=100,000 COLONIES/mL KLEBSIELLA  PNEUMONIAE     RN Pressure Injury Documentation:     Estimated body mass index is 23.31 kg/m as calculated from the following:   Height as of 04/09/20:  (1.676 m).  Weight as of this encounter: 65.5 kg.  Malnutrition Type:  Nutrition Problem: Inadequate oral intake Etiology: dysphagia, acute illness   Malnutrition Characteristics:  Signs/Symptoms: NPO status   Nutrition Interventions:  Interventions: Tube feeding     Radiology Studies: No results found. Scheduled Meds: . amLODipine  10 mg Per Tube Daily  . chlorhexidine  15 mL Mouth Rinse BID  . free water  150 mL Per Tube Q6H  . insulin aspart  0-9 Units Subcutaneous Q4H  . insulin glargine  15 Units Subcutaneous Daily  . lisinopril  20 mg Per Tube BID  . mouth rinse  15 mL Mouth Rinse q12n4p  . metoprolol tartrate  25 mg Per Tube BID  . pantoprazole sodium  40 mg Per Tube BID  . senna-docusate  1 tablet Per Tube BID  . sodium chloride flush  10-40 mL Intracatheter Q12H   Continuous Infusions: . feeding supplement (OSMOLITE 1.2 CAL) 1,000 mL (05/17/20 2204)    LOS: 13 days   Hughie Closs, MD [facility] PAGER is on AMION  If 7PM-7AM, please contact night-coverage [email or web address]

## 2020-05-18 NOTE — Progress Notes (Signed)
Modified Barium Swallow Progress Note  Patient Details  Name: Alan Everett MRN: [id number] Date of Birth: 13-Aug-1955  Today's Date: 05/18/2020  Modified Barium Swallow completed.  Full report located under Chart Review in the Imaging Section.  Brief recommendations include the following:  Clinical Impression  Pt participated in limited MBS study, able to consume only small quantities of thin barium.  He required physical assistance to keep his head upright and centered given tendency for flexion to the left.  There was minimal purposeful effort to manipulate the bolus material, which spilled freely from right side of oral cavity, spread diffusely throughout mouth with poor cohesion, and eventually spilled passively into pharynx.  Liquids filled the vallecular and pyriform spaces.  Swallow initiation was significantly delayed, eventually triggering, followed by immediate and explosive coughing which caused his head/neck to thrust forward and he was no longer visible in frame.  Difficult to visualize barium - there appeared to be trace amount in anterior trachea.  Study ceased at this time due to poor participation.    Given pt's lethargy, moderate-severe dysphagia, and degree of neurological impairment, it is unlikely he will be able to support his nutritional needs by POs alone in the near future.  Recovery of swallow function may be prolonged.  Pt's family should consider longer term enteral feeding if that choice remains c/w goals of care.   Continue NPO; SLP will continue to follow for aphasia and dysphagia tx as pt is able to participate.     Swallow Evaluation Recommendations       SLP Diet Recommendations: NPO        Medication Administration: Via alternative means               Oral Care Recommendations: Oral care QID       Deneane Stifter L. Samson Frederic, MA CCC/SLP Acute Rehabilitation Services Office number [phone] Pager [phone]  Blenda Mounts  Laurice 05/18/2020,1:41 PM

## 2020-05-18 NOTE — Progress Notes (Signed)
Physical Therapy Treatment Patient Details Name: Alan Everett MRN: [id number] DOB: Oct 26, 1955 Today's Date: 05/18/2020    History of Present Illness Patient is a 63 y/o male with PMH ETOH use, tobacco abuse, admitted with R side weakness,sensory loss and speech deficits.  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    PT Comments    Pt more alert but restless today.  He continues to present with R inattention, unable to communicate, and unable to follow commands.  Session focused on EOB balance, weight bearing into L arm in sitting, ROM exercises, and sit to stands with max A x 2/facilitation/weight shifting.  Continues to require mod-max A for EOB balance and R side essentially flaccid.  Pt did respond with head lift to his long term friend, Mary's, voice.  Cont Plan of Care.    Follow Up Recommendations  SNF;Supervision/Assistance - 24 hour     Equipment Recommendations  Wheelchair cushion (measurements PT);Wheelchair (measurements PT);Hospital bed;Other (comment) (hoyer)    Recommendations for Other Services Rehab consult     Precautions / Restrictions Precautions Precautions: Fall Precaution Comments: soft cuff restraint L wrist, pushes to Rt, coretrack, BP <180    Mobility  Bed Mobility Overal bed mobility: Needs Assistance Bed Mobility: Supine to Sit;Sit to Supine;Rolling Rolling: Max assist;+2 for physical assistance   Supine to sit: +2 for physical assistance;Max assist Sit to supine: +2 for physical assistance;Max assist   General bed mobility comments: Assist for bil legs and trunk for transfer  Transfers Overall transfer level: Needs assistance Equipment used: 2 person hand held assist Transfers: Sit to/from Stand Sit to Stand: +2 physical assistance;Max assist;From elevated surface         General transfer comment: Performed max A x 2 sit to stand with R knee blocked and facilitation/assist at  buttock and shoulder for upright posture  Ambulation/Gait                 Stairs             Wheelchair Mobility    Modified Rankin (Stroke Patients Only) Modified Rankin (Stroke Patients Only) Pre-Morbid Rankin Score: No symptoms Modified Rankin: Severe disability     Balance Overall balance assessment: Needs assistance Sitting-balance support: Single extremity supported;Feet supported Sitting balance-Leahy Scale: Poor Sitting balance - Comments: Pt reliant on therapist mod-max A for sitting EOB.  Worked on weight shifting, posture, and leaning into R elbow/hand for weight bearing with blocking to support elbow.  Also, worked on gentle neck ROM and holding head up (pt tending to flex and L lateral flex/rotate) Sat EOB for ~10 mins Postural control: Posterior lean;Right lateral lean Standing balance support: Single extremity supported;During functional activity Standing balance-Leahy Scale: Zero Standing balance comment: dependent on therapists; Performed 2 stands - ~10 sec and ~45 seconds; weight shifting with R knee blocked                            Cognition Arousal/Alertness: Awake/alert Behavior During Therapy: Restless Overall Cognitive Status: Difficult to assess                                 General Comments: Pt is not following any commands, pt does seem to respond to long time friend [name] in room with name call with head slight lift, no verbalizations, demonstrates R neglect/inattention  Exercises Other Exercises Other Exercises: PROM R LE heel slides, abd/add at hip, hip IR/ER, ankle pumps all x 5 with good ROM    General Comments General comments (skin integrity, edema, etc.): L wrist restraint and mitt replaced after session.  Pt soundly sleeping once returned to supine.  Discussed with pt's friend, [name], R neglect/inattention and encouraged some communication with pt on R side - she verbalized understanding and has  worked with stroke pt's in her job.      Pertinent Vitals/Pain Pain Assessment: Faces Faces Pain Scale: No hurt    Home Living                      Prior Function            PT Goals (current goals can now be found in the care plan section) Acute Rehab PT Goals Patient Stated Goal: unable to state PT Goal Formulation: Patient unable to participate in goal setting Time For Goal Achievement: 05/21/20 Potential to Achieve Goals: Fair Progress towards PT goals: Progressing toward goals    Frequency    Min 3X/week      PT Plan Current plan remains appropriate    Co-evaluation PT/OT/SLP Co-Evaluation/Treatment: Yes Reason for Co-Treatment: Complexity of the patient's impairments (multi-system involvement);For patient/therapist safety PT goals addressed during session: Mobility/safety with mobility;Balance OT goals addressed during session: ADL's and self-care;Strengthening/ROM      AM-PAC PT "6 Clicks" Mobility   Outcome Measure  Help needed turning from your back to your side while in a flat bed without using bedrails?: Total Help needed moving from lying on your back to sitting on the side of a flat bed without using bedrails?: Total Help needed moving to and from a bed to a chair (including a wheelchair)?: Total Help needed standing up from a chair using your arms (e.g., wheelchair or bedside chair)?: Total Help needed to walk in hospital room?: Total Help needed climbing 3-5 steps with a railing? : Total 6 Click Score: 6    End of Session Equipment Utilized During Treatment: Gait belt Activity Tolerance: Patient tolerated treatment well Patient left: in bed;with call bell/phone within reach;with bed alarm set;with SCD's reapplied;with restraints reapplied;Other (comment) (HOB >30 degrees) Nurse Communication: Mobility status;Need for lift equipment PT Visit Diagnosis: Unsteadiness on feet (R26.81);Muscle weakness (generalized) (M62.81);Difficulty in  walking, not elsewhere classified (R26.2);Other symptoms and signs involving the nervous system (R29.898);Hemiplegia and hemiparesis Hemiplegia - Right/Left: Right Hemiplegia - dominant/non-dominant: Dominant Hemiplegia - caused by: Nontraumatic intracerebral hemorrhage     Time: 3570-1779 PT Time Calculation (min) (ACUTE ONLY): 20 min  Charges:  $Therapeutic Activity: 8-22 mins                     Anise Salvo, PT Acute Rehab Services Pager [phone] [facility] [phone]     Rayetta Humphrey 05/18/2020, 1:51 PM

## 2020-05-18 NOTE — Progress Notes (Signed)
  Speech Language Pathology Treatment: Dysphagia  Patient Details Name: Alan Everett MRN: [id number] DOB: 22-Dec-1955 Today's Date: 05/18/2020 Time: 0076-2263 SLP Time Calculation (min) (ACUTE ONLY): 11 min  Assessment / Plan / Recommendation Clinical Impression  Pt alert.  His partner, Alan Everett, is at the bedside.  Dysphagia tx addressed readiness for POs or instrumental testing.  Pt's aphasia interferes with following commands, however, he responded with automaticity to ice chips being presented to lips - demonstrated adequate recognition, purposeful manipulation, and palpable swallow response.  There was intermittent coughing after swallowing.  Recommend proceeding with MBS today to determine any potential to begin modified POs and whether longer term enteral feeding may be needed.  D/W Alan Everett and Alan Everett is aware of plan. Scheduled for 1:00 pm today.   HPI HPI: Alan Everett is a 64 y.o. male with a PMHx of alcohol use and tobacco abuse who presents acutely to the ED via EMS as a Code Stroke for acute onset of right sided weakness. Patient with disoriented, right leg weakness, right limb ataxia, right decreased sensation and Global aphasia on exam. 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 Worsening of intraventricular      SLP Plan  MBS       Recommendations  Diet recommendations: NPO                Oral Care Recommendations: Oral care QID Follow up Recommendations: Inpatient Rehab SLP Visit Diagnosis: Dysphagia, unspecified (R13.10) Plan: MBS       GO                Blenda Mounts Laurice 05/18/2020, 10:30 AM  Marchelle Folks L. Samson Frederic, MA CCC/SLP Acute Rehabilitation Services Office number [phone] Pager [phone]

## 2020-05-18 NOTE — Plan of Care (Signed)
Pt alert but confused. Safety measures maintained. Pt is on RA. Tele in place. TF patent via coretrak.   Problem: Education: Goal: Knowledge of General Education information will improve Description: Including pain rating scale, medication(s)/side effects and non-pharmacologic comfort measures Outcome: Progressing   Problem: Health Behavior/Discharge Planning: Goal: Ability to manage health-related needs will improve Outcome: Progressing   Problem: Clinical Measurements: Goal: Ability to maintain clinical measurements within normal limits will improve Outcome: Progressing Goal: Will remain free from infection Outcome: Progressing Goal: Diagnostic test results will improve Outcome: Progressing Goal: Respiratory complications will improve Outcome: Progressing Goal: Cardiovascular complication will be avoided Outcome: Progressing   Problem: Activity: Goal: Risk for activity intolerance will decrease Outcome: Progressing   Problem: Nutrition: Goal: Adequate nutrition will be maintained Outcome: Progressing   Problem: Coping: Goal: Level of anxiety will decrease Outcome: Progressing   Problem: Elimination: Goal: Will not experience complications related to bowel motility Outcome: Progressing Goal: Will not experience complications related to urinary retention Outcome: Progressing   Problem: Pain Managment: Goal: General experience of comfort will improve Outcome: Progressing   Problem: Safety: Goal: Ability to remain free from injury will improve Outcome: Progressing   Problem: Skin Integrity: Goal: Risk for impaired skin integrity will decrease Outcome: Progressing   Problem: Education: Goal: Knowledge of disease or condition will improve Outcome: Progressing Goal: Knowledge of secondary prevention will improve Outcome: Progressing Goal: Knowledge of patient specific risk factors addressed and post discharge goals established will improve Outcome:  Progressing Goal: Individualized Educational Video(s) Outcome: Progressing   Problem: Coping: Goal: Will verbalize positive feelings about self Outcome: Progressing Goal: Will identify appropriate support needs Outcome: Progressing   Problem: Health Behavior/Discharge Planning: Goal: Ability to manage health-related needs will improve Outcome: Progressing   Problem: Self-Care: Goal: Ability to participate in self-care as condition permits will improve Outcome: Progressing Goal: Verbalization of feelings and concerns over difficulty with self-care will improve Outcome: Progressing Goal: Ability to communicate needs accurately will improve Outcome: Progressing   Problem: Nutrition: Goal: Risk of aspiration will decrease Outcome: Progressing Goal: Dietary intake will improve Outcome: Progressing   Problem: Intracerebral Hemorrhage Tissue Perfusion: Goal: Complications of Intracerebral Hemorrhage will be minimized Outcome: Progressing

## 2020-05-18 NOTE — TOC Initial Note (Signed)
Transition of Care [facility]) - Initial/Assessment Note    Patient Details  Name: Alan Everett MRN: [id number] Date of Birth: 1956-06-19  Transition of Care [facility]) CM/SW Contact:    Baldemar Lenis, LCSW Phone Number: 05/18/2020, 4:34 PM  Clinical Narrative:    CSW contacted patient's niece, [name], per her request. [name] asked about starting Medicaid, and CSW indicated that would be financial counseling. Per [name], no one from financial counseling has reached out to them. CSW had previously asked financial counseling to initiate Medicaid/Disability for patient. CSW sent an email to Financial Counselor to request they reach out to [facility] to screen for Medicaid/Disability and complete applications as patient will need LOG for SNF. CSW explained LOG to [place], and family is hopeful that patient can admit to [facility] at discharge because his significant other works there. CSW to reach out to [facility] to discuss. CSW to follow.               Expected Discharge Plan: Skilled Nursing Facility Barriers to Discharge: Continued Medical Work up, SNF Pending payor source - LOG, SNF Pending bed offer, SNF Pending Medicaid   Patient Goals and CMS Choice Patient states their goals for this hospitalization and ongoing recovery are:: patient unable to participate in goal setting due to disorientation CMS Medicare.gov Compare Post Acute Care list provided to:: Patient Represenative (must comment) Choice offered to / list presented to : Adult Children (niece)  Expected Discharge Plan and Services Expected Discharge Plan: Skilled Nursing Facility     Post Acute Care Choice: Skilled Nursing Facility Living arrangements for the past 2 months: Single Family Home                                      Prior Living Arrangements/Services Living arrangements for the past 2 months: Single Family Home   Patient language and need for interpreter reviewed:: No        Need for Family  Participation in Patient Care: Yes (Comment) Care giver support system in place?: No (comment)   Criminal Activity/Legal Involvement Pertinent to Current Situation/Hospitalization: No - Comment as needed  Activities of Daily Living   ADL Screening (condition at time of admission) Patient's cognitive ability adequate to safely complete daily activities?: No Is the patient deaf or have difficulty hearing?: No Does the patient have difficulty seeing, even when wearing glasses/contacts?: No Does the patient have difficulty concentrating, remembering, or making decisions?: No Patient able to express need for assistance with ADLs?: No Does the patient have difficulty dressing or bathing?: Yes Independently performs ADLs?: No Communication: Dependent Is this a change from baseline?: Change from baseline, expected to last >3 days Dressing (OT): Dependent Is this a change from baseline?: Change from baseline, expected to last >3 days Grooming: Dependent Is this a change from baseline?: Change from baseline, expected to last >3 days Feeding: Dependent Is this a change from baseline?: Change from baseline, expected to last >3 days Bathing: Dependent Is this a change from baseline?: Change from baseline, expected to last >3 days Toileting: Dependent Is this a change from baseline?: Change from baseline, expected to last >3days In/Out Bed: Dependent Is this a change from baseline?: Change from baseline, expected to last >3 days Walks in Home: Dependent Is this a change from baseline?: Change from baseline, expected to last >3 days Does the patient have difficulty walking or climbing stairs?: Yes Weakness of  Legs: Right Weakness of Arms/Hands: Right  Permission Sought/Granted Permission sought to share information with : Facility Medical sales representative, Family Supports Permission granted to share information with : Yes, Verbal Permission Granted  Share Information with NAME: Sherre Poot  Permission granted to share info w AGENCY: SNF  Permission granted to share info w Relationship: Niece, Significant other     Emotional Assessment   Attitude/Demeanor/Rapport: Unable to Assess Affect (typically observed): Unable to Assess   Alcohol / Substance Use: Not Applicable Psych Involvement: No (comment)  Admission diagnosis:  ICH (intracerebral hemorrhage) (HCC) [I61.9] Nontraumatic intracerebral hemorrhage, unspecified cerebral location, unspecified laterality [facility]) [I61.9] Patient Active Problem List   Diagnosis Date Noted  . Palliative care by specialist   . Goals of care, counseling/discussion   . DNR (do not resuscitate) discussion   . Leukocytosis   . Aspiration pneumonia of both lower lobes (HCC)   . Fever   . SOB (shortness of breath)   . SIRS (systemic inflammatory response syndrome) (HCC)   . Polysubstance abuse (HCC)   . Dysphagia, post-stroke   . Hyperglycemia   . Hypokalemia   . Hypomagnesemia   . ICH (intracerebral hemorrhage) (HCC) 05/05/2020  . Iron deficiency 02/29/2020  . Symptomatic anemia 02/28/2020  . Microcytic anemia 02/28/2020  . Alcohol use 02/28/2020  . Tobacco abuse    PCP:  Patient, No Pcp Per Pharmacy:   [facility] [address]), [facility] - [address] [place]) [place] [address] Phone: [phone] Fax: [phone]     Social Determinants of Health (SDOH) Interventions    Readmission Risk Interventions No flowsheet data found.

## 2020-05-19 LAB — GLUCOSE, CAPILLARY
Glucose-Capillary: 121 mg/dL — ABNORMAL HIGH (ref 70–99)
Glucose-Capillary: 134 mg/dL — ABNORMAL HIGH (ref 70–99)
Glucose-Capillary: 134 mg/dL — ABNORMAL HIGH (ref 70–99)
Glucose-Capillary: 137 mg/dL — ABNORMAL HIGH (ref 70–99)
Glucose-Capillary: 148 mg/dL — ABNORMAL HIGH (ref 70–99)
Glucose-Capillary: 149 mg/dL — ABNORMAL HIGH (ref 70–99)

## 2020-05-19 LAB — CBC WITH DIFFERENTIAL/PLATELET
Abs Immature Granulocytes: 0.75 10*3/uL — ABNORMAL HIGH (ref 0.00–0.07)
Basophils Absolute: 0.1 10*3/uL (ref 0.0–0.1)
Basophils Relative: 0 %
Eosinophils Absolute: 0.6 10*3/uL — ABNORMAL HIGH (ref 0.0–0.5)
Eosinophils Relative: 3 %
HCT: 24.7 % — ABNORMAL LOW (ref 39.0–52.0)
Hemoglobin: 7.3 g/dL — ABNORMAL LOW (ref 13.0–17.0)
Immature Granulocytes: 4 %
Lymphocytes Relative: 7 %
Lymphs Abs: 1.4 10*3/uL (ref 0.7–4.0)
MCH: 29.6 pg (ref 26.0–34.0)
MCHC: 29.6 g/dL — ABNORMAL LOW (ref 30.0–36.0)
MCV: 100 fL (ref 80.0–100.0)
Monocytes Absolute: 1.4 10*3/uL — ABNORMAL HIGH (ref 0.1–1.0)
Monocytes Relative: 8 %
Neutro Abs: 14.9 10*3/uL — ABNORMAL HIGH (ref 1.7–7.7)
Neutrophils Relative %: 78 %
Platelets: 309 10*3/uL (ref 150–400)
RBC: 2.47 MIL/uL — ABNORMAL LOW (ref 4.22–5.81)
RDW: 18.5 % — ABNORMAL HIGH (ref 11.5–15.5)
WBC: 19.1 10*3/uL — ABNORMAL HIGH (ref 4.0–10.5)
nRBC: 0.2 % (ref 0.0–0.2)

## 2020-05-19 NOTE — Consult Note (Signed)
Chief Complaint: Patient was seen in consultation today for percutaneous gastrostomy placement.  Referring Physician(s): Hughie ClossPahwani, Ravi, MD  Supervising Physician: Marliss CootsSuttle, Dylan  [facility]  History of Present Illness: Alan Everett is a 64 y.o. male with a past medical history significant for tobacco use, ETOH abuse and substance use who presented to the ED on 05/05/20 as a code stroke due to right sided weakness. 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 He has had persistent dysphagia and aspiration pneumonia - IR has been asked to place a g-tube for ongoing nutrition.   No visitors or staff at bedside today, patient is alert and mumbling but does not follow commands. NG tube in place. Attempted to call patient's niece Judeth PorchBeverly Torain to discuss procedure however there was no answer. Per chart family is agreeable to placement of g-tube.  Past Medical History:  Diagnosis Date  . Alcohol use   . Tobacco abuse     Past Surgical History:  Procedure Laterality Date  . COLONOSCOPY N/A 03/01/2020   Procedure: COLONOSCOPY;  Surgeon: Toledo, Boykin Nearingeodoro K, MD;  Location: [facility] ENDOSCOPY;  Service: Gastroenterology;  Laterality: N/A;  . ESOPHAGOGASTRODUODENOSCOPY N/A 03/01/2020   Procedure: ESOPHAGOGASTRODUODENOSCOPY (EGD);  Surgeon: Toledo, Boykin Nearingeodoro K, MD;  Location: [facility] ENDOSCOPY;  Service: Gastroenterology;  Laterality: N/A;  . FOOT FRACTURE SURGERY Left     Allergies: Patient has no known allergies.  Medications: Prior to Admission medications   Medication Sig Start Date End Date Taking? Authorizing Provider  acetaminophen (TYLENOL) 500 MG tablet Take 500-1,000 mg by mouth every 6 (six) hours as needed for mild pain, moderate pain or fever. Patient not taking: Reported on 03/10/2020    [provider]  docusate sodium (STOOL SOFTENER) 100 MG capsule Take 100 mg by mouth 2 (two) times daily.    [provider]  Iron, Ferrous Sulfate, 325 (65 Fe) MG TABS Take 325 mg by mouth daily. 03/01/20   Tresa MooreSreenath, Sudheer B, MD     Family History  Problem Relation Age of Onset  . Cancer Mother        his mother died of cancer, but patient is not sure which type of cancner.   . Diabetes Mellitus II Niece     Social History   Socioeconomic History  . Marital status: Single    Spouse name: Not on file  . Number of children: Not on file  . Years of education: Not on file  . Highest education level: Not on file  Occupational History  . Not on file  Tobacco Use  . Smoking status: Current Every Day Smoker  . Smokeless tobacco: Never Used  Substance and Sexual Activity  . Alcohol use: Yes    Alcohol/week: 6.0 standard drinks    Types: 6 Cans of beer per week  . Drug use: Never  . Sexual activity: Not on file  Other Topics Concern  . Not on file  Social History Narrative   Works part time- [employer]; live [place] with girl friend; 3-4 cig/day; beer.    Social Determinants of Health   Financial Resource Strain:   . Difficulty of Paying Living Expenses: Not on file  Food Insecurity:   . Worried About Programme researcher, broadcasting/film/videounning Out of Food in the Last Year: Not on file  . Ran Out of Food in the Last Year: Not on file  Transportation Needs:   . Lack of Transportation (Medical): Not on file  .  Lack of Transportation (Non-Medical): Not on file  Physical Activity:   . Days of Exercise per Week: Not on file  . Minutes of Exercise per Session: Not on file  Stress:   . Feeling of Stress : Not on file  Social Connections:   . Frequency of Communication with Friends and Family: Not on file  . Frequency of Social Gatherings with Friends and Family: Not on file  . Attends Religious Services: Not on file  . Active Member of Clubs or Organizations: Not on file  . Attends Banker Meetings: Not on file  . Marital Status: Not on file     Review of Systems: A 12 point ROS discussed and  pertinent positives are indicated in the HPI above.  All other systems are negative.  Review of Systems  Unable to perform ROS: Patient nonverbal    Vital Signs: BP 105/66 (BP Location: Right Arm)   Pulse 89   Temp 99.3 F (37.4 C) (Axillary)   Resp 20   Wt 145 lb 1 oz (65.8 kg)   SpO2 100%   BMI 23.41 kg/m   Physical Exam Vitals and nursing note reviewed.  Constitutional:      General: He is not in acute distress. HENT:     Head: Normocephalic.  Cardiovascular:     Rate and Rhythm: Normal rate and regular rhythm.  Pulmonary:     Effort: Pulmonary effort is normal.     Breath sounds: Normal breath sounds.  Abdominal:     General: There is no distension.     Palpations: Abdomen is soft.     Tenderness: There is no abdominal tenderness.     Comments: (+) NG in place  Skin:    General: Skin is warm and dry.  Neurological:     Mental Status: He is alert. Mental status is at baseline.      MD Evaluation Airway: WNL Heart: WNL Abdomen: WNL Chest/ Lungs: WNL ASA  Classification: 3 Mallampati/Airway Score: Two   Imaging: CT ANGIO HEAD W OR WO CONTRAST  Result Date: 05/07/2020 CLINICAL DATA:  Parenchymal hemorrhage EXAM: CT ANGIOGRAPHY HEAD AND NECK TECHNIQUE: Multidetector CT imaging of the head and neck was performed using the standard protocol during bolus administration of intravenous contrast. Multiplanar CT image reconstructions and MIPs were obtained to evaluate the vascular anatomy. Carotid stenosis measurements (when applicable) are obtained utilizing NASCET criteria, using the distal internal carotid diameter as the denominator. CONTRAST:  35mL OMNIPAQUE IOHEXOL 350 MG/ML SOLN COMPARISON:  None. FINDINGS: CTA NECK Aortic arch: Great vessel origins are patent. Right carotid system: Patent. Mild calcified plaque along the proximal ICA causing minor stenosis. Left carotid system: Patent. Mild calcified plaque along the proximal ICA causing minor stenosis. Vertebral  arteries: Patent and codominant. There is mild plaque at the origins. Skeleton: Degenerative changes of the cervical spine. Other neck: No mass or adenopathy. Upper chest: No apical lung mass. Review of the MIP images confirms the above findings CTA HEAD There is no abnormal vascularity in the region of parenchymal hemorrhage. Anterior circulation: Intracranial internal carotid arteries are patent with calcified plaque causing mild to moderate stenosis. Anterior cerebral arteries are patent with an anterior communicating artery present. Middle cerebral arteries are patent. Posterior circulation: Intracranial vertebral arteries, basilar artery, and posterior cerebral arteries are patent. Venous sinuses: Patent as allowed by contrast bolus timing. Review of the MIP images confirms the above findings IMPRESSION: No abnormal vascularity in the region of intracranial hemorrhage. No hemodynamically  significant stenosis. Electronically Signed   By: Guadlupe Spanish M.D.   On: 05/07/2020 08:11   CT HEAD WO CONTRAST  Result Date: 05/08/2020 CLINICAL DATA:  Follow-up examination for intracranial hemorrhage. EXAM: CT HEAD WITHOUT CONTRAST TECHNIQUE: Contiguous axial images were obtained from the base of the skull through the vertex without intravenous contrast. COMPARISON:  Prior CT from 05/05/2020. FINDINGS: Brain: Examination moderately to severely degraded by motion artifact. Intraparenchymal hemorrhage centered at the left thalamus again seen, little interval changed in size and morphology as compared to previous exam. Surrounding low-density vasogenic edema with localized mass effect, similar. Associated left-to-right shift measures up to approximately 8 mm, little interval changed. Intraventricular extension with blood within the left greater than right lateral ventricles is overall similar. Associated ventricular size morphology is not significantly changed on this motion degraded exam. Scattered small volume  subarachnoid hemorrhage now seen within the posterior right frontotemporal region, likely related to redistribution. No new acute intracranial hemorrhage. No acute large vessel territory infarct. No visible extra-axial fluid collection. No other new acute intracranial abnormality. Vascular: No appreciable hyperdense vessel or other abnormality on this motion degraded exam. Skull: Grossly stable allowing for motion. Sinuses/Orbits: Globes and orbital soft tissues demonstrate no acute finding. Paranasal sinuses are clear. Nasogastric tube in place. Mastoid air cells are clear. Other: None. IMPRESSION: 1. Moderately to severely degraded exam due to motion artifact. 2. No significant interval changed in size and morphology of left thalamic hemorrhage with similar localized mass effect and 8 mm of left-to-right midline shift. 3. Similar intraventricular extension with blood within the left greater than right lateral ventricles. Associated ventricular size morphology not significantly changed on this motion degraded exam. 4. Scattered small volume subarachnoid hemorrhage within the posterior right frontotemporal region, likely related to redistribution. 5. No other new acute intracranial abnormality. Electronically Signed   By: Rise Mu M.D.   On: 05/08/2020 01:22   CT HEAD WO CONTRAST  Result Date: 05/06/2020 CLINICAL DATA:  Follow-up parenchymal hemorrhage EXAM: CT HEAD WITHOUT CONTRAST TECHNIQUE: Contiguous axial images were obtained from the base of the skull through the vertex without intravenous contrast. COMPARISON:  Yesterday FINDINGS: Brain: Left thalamic hematoma with extensive intraventricular extension into the left more than right lateral ventricles and third ventricle. There has been some clearing of blood from the fourth ventricle. No significant change in ventricular volume. The thalamic component (which also extends into adjacent white matter tracks) measures up to 3 x 3.4 x 3.7 cm. Edema  has developed around the hematoma without change in local mass effect. No visible cortex infarct. Vascular: No hyperdense vessel or unexpected calcification. Skull: Normal. Negative for fracture or focal lesion. Sinuses/Orbits: No acute finding. IMPRESSION: Size stable left thalamic hematoma with intraventricular extension. There has been some clearing of blood from the fourth ventricle; the ventricular volume is essentially stable. Electronically Signed   By: Marnee Spring M.D.   On: 05/06/2020 05:10   CT Head Wo Contrast  Result Date: 05/05/2020 CLINICAL DATA:  Follow-up intracranial hemorrhage EXAM: CT HEAD WITHOUT CONTRAST TECHNIQUE: Contiguous axial images were obtained from the base of the skull through the vertex without intravenous contrast. COMPARISON:  Earlier same day FINDINGS: Brain: Intraparenchymal hemorrhage in the region of the left thalamus previously measuring about 2.5 cm in diameter now measures almost 3.5 cm in diameter. There has been worsening of intraventricular penetration with blood now filling the left lateral ventricle and extending into the third and fourth ventricles. There is now mass effect with left-to-right  midline shift of 7-8 mm. Vascular: No new vascular finding. Skull: Normal Sinuses/Orbits: Clear/normal Other: None IMPRESSION: 1. Intraparenchymal hemorrhage in the region of the left thalamus previously measuring about 2.5 cm in diameter now measures almost 4 cm in diameter. There has been worsening of intraventricular penetration with blood now filling the left lateral ventricle and extending into the third and fourth ventricles. 2. These results were called by telephone at the time of interpretation on 05/05/2020 at 8:41 pm to provider [facility] , who verbally acknowledged these results. Electronically Signed   By: Paulina Fusi M.D.   On: 05/05/2020 20:42   CT HEAD W & WO CONTRAST  Result Date: 05/12/2020 CLINICAL DATA:  64 year old male with intracranial  hemorrhage. Subsequent encounter. EXAM: CT HEAD WITHOUT AND WITH CONTRAST TECHNIQUE: Contiguous axial images were obtained from the base of the skull through the vertex without and with intravenous contrast CONTRAST:  80mL OMNIPAQUE IOHEXOL 300 MG/ML  SOLN COMPARISON:  Head CTs 05/08/2020 and earlier. FINDINGS: Brain: Irregular hyperdense hemorrhage centered at the posterior left thalamus and lentiform with intraventricular extension again noted. Intra-axial hemorrhage component now estimated at 32 mL - 44 x 33 x 44 mm (AP by transverse by CC). This is stable when measured in the same way as on 05/08/2020. The volume of intraventricular blood is stable, moderate but primarily limited to the left lateral ventricle as before. Small volume layering in the right occipital horn. Trace subarachnoid hemorrhage over the posterior convexities is stable. Regional edema and mild regional mass effect is stable. There is stable mild rightward midline shift. Basilar cisterns remain normal. Mild lateral ventriculomegaly is stable. Stable gray-white matter differentiation elsewhere. No new intracranial abnormality. Vascular: Calcified atherosclerosis at the skull base. No suspicious intracranial vascular hyperdensity. Skull: No acute osseous abnormality identified. Sinuses/Orbits: Left nasoenteric tube in place. Visualized paranasal sinuses and mastoids are stable and well pneumatized. Other: Retained secretions in the nasopharynx. No acute orbit or scalp soft tissue finding. IMPRESSION: 1. 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 Regional edema, mild regional mass effect, mild lateral ventriculomegaly, and trace subarachnoid hemorrhage. 2. No new intracranial abnormality. Electronically Signed   By: Odessa Fleming M.D.   On: 05/12/2020 16:33   CT ANGIO NECK W OR WO CONTRAST  Result Date: 05/07/2020 CLINICAL DATA:  Parenchymal hemorrhage EXAM: CT ANGIOGRAPHY HEAD AND NECK TECHNIQUE:  Multidetector CT imaging of the head and neck was performed using the standard protocol during bolus administration of intravenous contrast. Multiplanar CT image reconstructions and MIPs were obtained to evaluate the vascular anatomy. Carotid stenosis measurements (when applicable) are obtained utilizing NASCET criteria, using the distal internal carotid diameter as the denominator. CONTRAST:  80mL OMNIPAQUE IOHEXOL 350 MG/ML SOLN COMPARISON:  None. FINDINGS: CTA NECK Aortic arch: Great vessel origins are patent. Right carotid system: Patent. Mild calcified plaque along the proximal ICA causing minor stenosis. Left carotid system: Patent. Mild calcified plaque along the proximal ICA causing minor stenosis. Vertebral arteries: Patent and codominant. There is mild plaque at the origins. Skeleton: Degenerative changes of the cervical spine. Other neck: No mass or adenopathy. Upper chest: No apical lung mass. Review of the MIP images confirms the above findings CTA HEAD There is no abnormal vascularity in the region of parenchymal hemorrhage. Anterior circulation: Intracranial internal carotid arteries are patent with calcified plaque causing mild to moderate stenosis. Anterior cerebral arteries are patent with an anterior communicating artery present. Middle cerebral arteries are patent. Posterior circulation: Intracranial vertebral arteries, basilar artery,  and posterior cerebral arteries are patent. Venous sinuses: Patent as allowed by contrast bolus timing. Review of the MIP images confirms the above findings IMPRESSION: No abnormal vascularity in the region of intracranial hemorrhage. No hemodynamically significant stenosis. Electronically Signed   By: Guadlupe Spanish M.D.   On: 05/07/2020 08:11   CT CHEST W CONTRAST  Result Date: 05/11/2020 CLINICAL DATA:  64 year old male with abdominal pain. Concern for pneumonia effusion, or abscess. EXAM: CT CHEST, ABDOMEN, AND PELVIS WITH CONTRAST TECHNIQUE: Multidetector  CT imaging of the chest, abdomen and pelvis was performed following the standard protocol during bolus administration of intravenous contrast. CONTRAST:  OMNIPAQUE IOHEXOL 300 MG/ML  SOLN COMPARISON:  Chest radiograph dated 05/11/2020. FINDINGS: CT CHEST FINDINGS Cardiovascular: There is no cardiomegaly or pericardial effusion. Coronary vascular calcification with predominant involvement of the LAD and right coronary artery. There is mild atherosclerotic calcification of the thoracic aorta. No aneurysmal dilatation or dissection. The origins of the great vessels of the aortic arch appear patent. Evaluation of the pulmonary arteries is limited due to respiratory motion artifact and suboptimal opacification. No large central pulmonary artery embolus identified. Mediastinum/Nodes: No hilar or mediastinal adenopathy. An enteric tube is noted extending to the distal stomach. The thyroid gland is grossly unremarkable. No mediastinal fluid collection. Lungs/Pleura: Bilateral lower lobe predominant pulmonary opacities most consistent with multifocal pneumonia, possibly atypical in etiology. Clinical correlation is recommended. There is no pleural effusion pneumothorax. The central airways are patent. Musculoskeletal: No chest wall mass or suspicious bone lesions identified. CT ABDOMEN PELVIS FINDINGS No intra-abdominal free air or free fluid. Hepatobiliary: Probable background of mild fatty liver. Ill-defined hypodense lesion in the anterior liver measuring 13 x 20 mm as well as an 18 x 19 mm lesion along the falciform ligament are not characterized on this CT. Further evaluation with MRI without and with contrast on a nonemergent/outpatient basis recommended. There is no intrahepatic biliary ductal dilatation. The gallbladder is unremarkable. Pancreas: Unremarkable. No pancreatic ductal dilatation or surrounding inflammatory changes. Spleen: Normal in size without focal abnormality. Adrenals/Urinary Tract: The  adrenal glands unremarkable. There is no hydronephrosis on either side. There is symmetric enhancement and excretion of contrast by both kidneys. The visualized ureters and urinary bladder appear unremarkable. Stomach/Bowel: Feeding tube with tip in the distal stomach. There is no bowel obstruction or active inflammation. The appendix is normal. Vascular/Lymphatic: Advanced atherosclerotic calcification of the distal aorta and iliac arteries. The IVC is unremarkable. No portal venous gas. There is no adenopathy. Reproductive: The prostate and seminal vesicles are grossly unremarkable. No pelvic mass. Other: Small fat containing right inguinal hernia. Musculoskeletal: No acute or significant osseous findings. IMPRESSION: 1. Bilateral lower lobe predominant pulmonary opacities most consistent with multifocal pneumonia, possibly atypical in etiology. Clinical correlation is recommended. 2. No acute intra-abdominal or pelvic pathology. No bowel obstruction. Normal appendix. 3. Indeterminate hepatic lesions. Further characterization with MRI without and with contrast recommended. 4. Aortic Atherosclerosis (ICD10-I70.0). Electronically Signed   By: Elgie Collard M.D.   On: 05/11/2020 19:05   CT ABDOMEN PELVIS W CONTRAST  Result Date: 05/11/2020 CLINICAL DATA:  64 year old male with abdominal pain. Concern for pneumonia effusion, or abscess. EXAM: CT CHEST, ABDOMEN, AND PELVIS WITH CONTRAST TECHNIQUE: Multidetector CT imaging of the chest, abdomen and pelvis was performed following the standard protocol during bolus administration of intravenous contrast. CONTRAST:  OMNIPAQUE IOHEXOL 300 MG/ML  SOLN COMPARISON:  Chest radiograph dated 05/11/2020. FINDINGS: CT CHEST FINDINGS Cardiovascular: There is no cardiomegaly or pericardial  effusion. Coronary vascular calcification with predominant involvement of the LAD and right coronary artery. There is mild atherosclerotic calcification of the thoracic aorta. No  aneurysmal dilatation or dissection. The origins of the great vessels of the aortic arch appear patent. Evaluation of the pulmonary arteries is limited due to respiratory motion artifact and suboptimal opacification. No large central pulmonary artery embolus identified. Mediastinum/Nodes: No hilar or mediastinal adenopathy. An enteric tube is noted extending to the distal stomach. The thyroid gland is grossly unremarkable. No mediastinal fluid collection. Lungs/Pleura: Bilateral lower lobe predominant pulmonary opacities most consistent with multifocal pneumonia, possibly atypical in etiology. Clinical correlation is recommended. There is no pleural effusion pneumothorax. The central airways are patent. Musculoskeletal: No chest wall mass or suspicious bone lesions identified. CT ABDOMEN PELVIS FINDINGS No intra-abdominal free air or free fluid. Hepatobiliary: Probable background of mild fatty liver. Ill-defined hypodense lesion in the anterior liver measuring 13 x 20 mm as well as an 18 x 19 mm lesion along the falciform ligament are not characterized on this CT. Further evaluation with MRI without and with contrast on a nonemergent/outpatient basis recommended. There is no intrahepatic biliary ductal dilatation. The gallbladder is unremarkable. Pancreas: Unremarkable. No pancreatic ductal dilatation or surrounding inflammatory changes. Spleen: Normal in size without focal abnormality. Adrenals/Urinary Tract: The adrenal glands unremarkable. There is no hydronephrosis on either side. There is symmetric enhancement and excretion of contrast by both kidneys. The visualized ureters and urinary bladder appear unremarkable. Stomach/Bowel: Feeding tube with tip in the distal stomach. There is no bowel obstruction or active inflammation. The appendix is normal. Vascular/Lymphatic: Advanced atherosclerotic calcification of the distal aorta and iliac arteries. The IVC is unremarkable. No portal venous gas. There is no  adenopathy. Reproductive: The prostate and seminal vesicles are grossly unremarkable. No pelvic mass. Other: Small fat containing right inguinal hernia. Musculoskeletal: No acute or significant osseous findings. IMPRESSION: 1. Bilateral lower lobe predominant pulmonary opacities most consistent with multifocal pneumonia, possibly atypical in etiology. Clinical correlation is recommended. 2. No acute intra-abdominal or pelvic pathology. No bowel obstruction. Normal appendix. 3. Indeterminate hepatic lesions. Further characterization with MRI without and with contrast recommended. 4. Aortic Atherosclerosis (ICD10-I70.0). Electronically Signed   By: Elgie Collard M.D.   On: 05/11/2020 19:05   DG CHEST PORT 1 VIEW  Result Date: 05/12/2020 CLINICAL DATA:  Shortness of breath. EXAM: PORTABLE CHEST 1 VIEW COMPARISON:  May 11, 2020. FINDINGS: The heart size and mediastinal contours are within normal limits. Feeding tube is seen entering stomach. No pneumothorax is noted. Mild bibasilar subsegmental atelectasis or infiltrates are noted. The visualized skeletal structures are unremarkable. IMPRESSION: Mild bibasilar subsegmental atelectasis or infiltrates are noted. Electronically Signed   By: Lupita Raider M.D.   On: 05/12/2020 08:37   DG CHEST PORT 1 VIEW  Result Date: 05/11/2020 CLINICAL DATA:  Shortness of breath EXAM: PORTABLE CHEST 1 VIEW COMPARISON:  05/10/2020 FINDINGS: Enteric tube passes into the distal stomach with tip out of field of view. Patchy increased density, left greater than right. No pleural effusion. No pneumothorax. Stable cardiomediastinal contours. IMPRESSION: Persistent patchy increased density, left greater than right, which may reflect pneumonia. Electronically Signed   By: Guadlupe Spanish M.D.   On: 05/11/2020 08:11   DG CHEST PORT 1 VIEW  Result Date: 05/10/2020 CLINICAL DATA:  Shortness of breath. EXAM: PORTABLE CHEST 1 VIEW COMPARISON:  May 09, 2020. FINDINGS: Stable  cardiomediastinal silhouette. Feeding tube is seen entering stomach. No pneumothorax or pleural effusion is noted.  Mild bibasilar opacities are noted concerning for multifocal pneumonia. Bony thorax is unremarkable. IMPRESSION: Mild bibasilar opacities are noted concerning for multifocal pneumonia. Electronically Signed   By: Lupita Raider M.D.   On: 05/10/2020 08:14   DG CHEST PORT 1 VIEW  Result Date: 05/09/2020 CLINICAL DATA:  Fever. EXAM: PORTABLE CHEST 1 VIEW COMPARISON:  Chest radiograph 05/07/2020 FINDINGS: Enteric tube courses inferior to the diaphragm. Stable cardiac and mediastinal contours. Persistent heterogeneous opacities right lung base. New opacities left mid lung. No pleural effusion or pneumothorax. Thoracic spine degenerative changes. IMPRESSION: 1. Persistent right lower lung heterogeneous opacities which may represent atelectasis or infection. 2. New opacities left mid lung may represent atelectasis or infection. Electronically Signed   By: Annia Belt M.D.   On: 05/09/2020 16:18   DG CHEST PORT 1 VIEW  Result Date: 05/07/2020 CLINICAL DATA:  Leukocytosis EXAM: PORTABLE CHEST 1 VIEW COMPARISON:  Portable exam 1322 hours compared to 05/05/2020 FINDINGS: Feeding tube extends into stomach. Normal heart size, mediastinal contours, and pulmonary vascularity. Atelectasis versus infiltrate at RIGHT base. Remaining lungs clear. No pleural effusion or pneumothorax. Bones demineralized. IMPRESSION: Atelectasis versus infiltrate at RIGHT lung base. Electronically Signed   By: Ulyses Southward M.D.   On: 05/07/2020 14:57   DG Chest Port 1 View  Result Date: 05/05/2020 CLINICAL DATA:  Intracerebral hemorrhage EXAM: PORTABLE CHEST 1 VIEW COMPARISON:  02/28/2020 FINDINGS: No focal opacity or pleural effusion. Borderline cardiomegaly with mild central congestion and suspicion of mild interstitial edema. No pneumothorax. IMPRESSION: Borderline cardiomegaly with mild central congestion and suspicion of  mild interstitial edema. Electronically Signed   By: Jasmine Pang M.D.   On: 05/05/2020 20:09   DG Swallowing Func-Speech Pathology  Result Date: 05/18/2020 Objective Swallowing Evaluation: Type of Study: MBS-Modified Barium Swallow Study  Patient Details Name: Tajh Livsey MRN: [id number] Date of Birth: June 15, 1956 Today's Date: 05/18/2020 Time: SLP Start Time (ACUTE ONLY): 1302 -SLP Stop Time (ACUTE ONLY): 1322 SLP Time Calculation (min) (ACUTE ONLY): 20 min Past Medical History: Past Medical History: Diagnosis Date . Alcohol use  . Tobacco abuse  Past Surgical History: Past Surgical History: Procedure Laterality Date . COLONOSCOPY N/A 03/01/2020  Procedure: COLONOSCOPY;  Surgeon: Toledo, Boykin Nearing, MD;  Location: [facility] ENDOSCOPY;  Service: Gastroenterology;  Laterality: N/A; . ESOPHAGOGASTRODUODENOSCOPY N/A 03/01/2020  Procedure: ESOPHAGOGASTRODUODENOSCOPY (EGD);  Surgeon: Toledo, Boykin Nearing, MD;  Location: [facility] ENDOSCOPY;  Service: Gastroenterology;  Laterality: N/A; . FOOT FRACTURE SURGERY Left  HPI: Deuce Paternoster is a 64 y.o. male with a PMHx of alcohol use and tobacco abuse who presents acutely to the ED via EMS as a Code Stroke for acute onset of right sided weakness. Patient with disoriented, right leg weakness, right limb ataxia, right decreased sensation and Global aphasia on exam. 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 Worsening of intraventricular  Subjective: drowsy Assessment / Plan / Recommendation CHL IP CLINICAL IMPRESSIONS 05/18/2020 Clinical Impression Pt participated in limited MBS study, able to consume only small quantities of thin barium.  He required physical assistance to keep his head upright and centered given tendency for flexion to the left.  There was minimal purposeful effort to manipulate the bolus material, which spilled freely from right side of oral cavity, spread diffusely throughout mouth with poor cohesion, and eventually spilled passively into pharynx.  Liquids  filled the vallecular and pyriform spaces.  Swallow initiation was significantly delayed, eventually triggering, followed by immediate and explosive coughing which caused his head/neck to thrust forward and  he was no longer visible in frame.  Difficult to visualize barium - there appeared to be trace amount in anterior trachea.  Study ceased at this time due to poor participation.  Given pt's lethargy, inconsistent participation, and degree of sensory and motor impairment, family should consider longer-term enteral feeding.  Recovery of swallow function may be prolonged.  Continue NPO; SLP will continue to follow for aphasia and dysphagia tx as pt is able to participate.   SLP Visit Diagnosis Dysphagia, oropharyngeal phase (R13.12) Attention and concentration deficit following -- Frontal lobe and executive function deficit following -- Impact on safety and function Severe aspiration risk   CHL IP TREATMENT RECOMMENDATION 05/18/2020 Treatment Recommendations Therapy as outlined in treatment plan below   Prognosis 05/18/2020 Prognosis for Safe Diet Advancement Fair Barriers to Reach Goals Cognitive deficits Barriers/Prognosis Comment -- CHL IP DIET RECOMMENDATION 05/18/2020 SLP Diet Recommendations NPO Liquid Administration via -- Medication Administration Via alternative means Compensations -- Postural Changes --   CHL IP OTHER RECOMMENDATIONS 05/18/2020 Recommended Consults -- Oral Care Recommendations Oral care QID Other Recommendations --   CHL IP FOLLOW UP RECOMMENDATIONS 05/18/2020 Follow up Recommendations Inpatient Rehab   CHL IP FREQUENCY AND DURATION 05/18/2020 Speech Therapy Frequency (ACUTE ONLY) min 2x/week Treatment Duration 2 weeks      CHL IP ORAL PHASE 05/18/2020 Oral Phase Impaired Oral - Pudding Teaspoon -- Oral - Pudding Cup -- Oral - Honey Teaspoon -- Oral - Honey Cup -- Oral - Nectar Teaspoon -- Oral - Nectar Cup -- Oral - Nectar Straw -- Oral - Thin Teaspoon Right anterior bolus loss;Weak  lingual manipulation;Reduced posterior propulsion;Holding of bolus;Right pocketing in lateral sulci;Pocketing in anterior sulcus;Delayed oral transit;Decreased bolus cohesion;Premature spillage Oral - Thin Cup Right anterior bolus loss;Weak lingual manipulation;Reduced posterior propulsion;Holding of bolus;Right pocketing in lateral sulci;Pocketing in anterior sulcus;Delayed oral transit;Decreased bolus cohesion;Premature spillage Oral - Thin Straw -- Oral - Puree -- Oral - Mech Soft -- Oral - Regular -- Oral - Multi-Consistency -- Oral - Pill -- Oral Phase - Comment --  CHL IP PHARYNGEAL PHASE 05/18/2020 Pharyngeal Phase Impaired Pharyngeal- Pudding Teaspoon -- Pharyngeal -- Pharyngeal- Pudding Cup -- Pharyngeal -- Pharyngeal- Honey Teaspoon -- Pharyngeal -- Pharyngeal- Honey Cup -- Pharyngeal -- Pharyngeal- Nectar Teaspoon -- Pharyngeal -- Pharyngeal- Nectar Cup -- Pharyngeal -- Pharyngeal- Nectar Straw -- Pharyngeal -- Pharyngeal- Thin Teaspoon Delayed swallow initiation-pyriform sinuses;Reduced airway/laryngeal closure;Reduced tongue base retraction;Trace aspiration Pharyngeal -- Pharyngeal- Thin Cup Delayed swallow initiation-pyriform sinuses;Reduced airway/laryngeal closure;Reduced tongue base retraction;Trace aspiration Pharyngeal -- Pharyngeal- Thin Straw -- Pharyngeal -- Pharyngeal- Puree -- Pharyngeal -- Pharyngeal- Mechanical Soft -- Pharyngeal -- Pharyngeal- Regular -- Pharyngeal -- Pharyngeal- Multi-consistency -- Pharyngeal -- Pharyngeal- Pill -- Pharyngeal -- Pharyngeal Comment --  No flowsheet data found. Blenda Mounts Laurice 05/18/2020, 1:49 PM              ECHOCARDIOGRAM COMPLETE  Result Date: 05/06/2020    ECHOCARDIOGRAM REPORT   Patient Name:   CARNIE BRUEMMER Date of Exam: 05/06/2020 Medical Rec #:  [id number]    Height:       66.0 in Accession #:    [id number]   Weight:       144.4 lb Date of Birth:  1956/01/12    BSA:          1.741 m Patient Age:    64 years     BP:           119/60 mmHg  Patient Gender: M  HR:           88 bpm. Exam Location:  Inpatient Procedure: 2D Echo Indications:    Stroke I163.9  History:        Patient has no prior history of Echocardiogram examinations.                 Risk Factors:Current Smoker and Alcohol Abuse.  Sonographer:    Thurman Coyer RDCS (AE) Referring Phys: [phone] ERIC LINDZEN IMPRESSIONS  1. Left ventricular ejection fraction, by estimation, is 60 to 65%. The left ventricle has normal function. The left ventricle has no regional wall motion abnormalities. Left ventricular diastolic parameters are consistent with Grade I diastolic dysfunction (impaired relaxation).  2. Right ventricular systolic function is normal. The right ventricular size is normal. Tricuspid regurgitation signal is inadequate for assessing PA pressure.  3. The mitral valve is normal in structure. No evidence of mitral valve regurgitation. No evidence of mitral stenosis.  4. The aortic valve is tricuspid. Aortic valve regurgitation is not visualized. No aortic stenosis is present.  5. The inferior vena cava is normal in size with greater than 50% respiratory variability, suggesting right atrial pressure of 3 mmHg. FINDINGS  Left Ventricle: Left ventricular ejection fraction, by estimation, is 60 to 65%. The left ventricle has normal function. The left ventricle has no regional wall motion abnormalities. The left ventricular internal cavity size was normal in size. There is  no left ventricular hypertrophy. Left ventricular diastolic parameters are consistent with Grade I diastolic dysfunction (impaired relaxation). Right Ventricle: The right ventricular size is normal. No increase in right ventricular wall thickness. Right ventricular systolic function is normal. Tricuspid regurgitation signal is inadequate for assessing PA pressure. Left Atrium: Left atrial size was normal in size. Right Atrium: Right atrial size was normal in size. Pericardium: There is no evidence of  pericardial effusion. Mitral Valve: The mitral valve is normal in structure. No evidence of mitral valve regurgitation. No evidence of mitral valve stenosis. Tricuspid Valve: The tricuspid valve is normal in structure. Tricuspid valve regurgitation is not demonstrated. Aortic Valve: The aortic valve is tricuspid. Aortic valve regurgitation is not visualized. No aortic stenosis is present. Pulmonic Valve: The pulmonic valve was normal in structure. Pulmonic valve regurgitation is not visualized. Aorta: The aortic root is normal in size and structure. Venous: The inferior vena cava is normal in size with greater than 50% respiratory variability, suggesting right atrial pressure of 3 mmHg. IAS/Shunts: No atrial level shunt detected by color flow Doppler.  LEFT VENTRICLE PLAX 2D LVIDd:         4.30 cm  Diastology LVIDs:         2.70 cm  LV e' medial:    8.49 cm/s LV PW:         1.00 cm  LV E/e' medial:  9.2 LV IVS:        0.90 cm  LV e' lateral:   7.46 cm/s LVOT diam:     2.20 cm  LV E/e' lateral: 10.4 LV SV:         98 LV SV Index:   57 LVOT Area:     3.80 cm  RIGHT VENTRICLE RV S prime:     13.90 cm/s TAPSE (M-mode): 2.0 cm LEFT ATRIUM             Index       RIGHT ATRIUM           Index LA diam:  3.10 cm 1.78 cm/m  RA Area:     11.60 cm LA Vol (A2C):   40.9 ml 23.49 ml/m RA Volume:   24.60 ml  14.13 ml/m LA Vol (A4C):   37.9 ml 21.77 ml/m LA Biplane Vol: 43.0 ml 24.69 ml/m  AORTIC VALVE LVOT Vmax:   120.00 cm/s LVOT Vmean:  75.400 cm/s LVOT VTI:    0.259 m  AORTA Ao Root diam: 3.20 cm MITRAL VALVE MV Area (PHT): 2.80 cm    SHUNTS MV Decel Time: 271 msec    Systemic VTI:  0.26 m MV E velocity: 77.70 cm/s  Systemic Diam: 2.20 cm MV A velocity: 84.50 cm/s MV E/A ratio:  0.92 Marca Ancona MD Electronically signed by Marca Ancona MD Signature Date/Time: 05/06/2020/3:17:42 PM    Final    CT HEAD CODE STROKE WO CONTRAST  Result Date: 05/05/2020 CLINICAL DATA:  Code stroke. Initial evaluation for acute  neuro deficit, stroke suspected. EXAM: CT HEAD WITHOUT CONTRAST TECHNIQUE: Contiguous axial images were obtained from the base of the skull through the vertex without intravenous contrast. COMPARISON:  None. FINDINGS: Brain: 2.3 x 2.1 x 2.9 cm acute intraparenchymal hemorrhage centered at the left thalamic capsular region (series 3, image 16, estimated volume 7 cc). Minimal surrounding vasogenic edema without significant regional mass effect or midline shift. Associated intraventricular extension with trace intraventricular blood seen within the adjacent left lateral ventricle. No hydrocephalus or ventricular trapping. No other acute intracranial hemorrhage. No other acute large vessel territory infarct. Underlying age-related cerebral atrophy with mild chronic small vessel ischemic disease. No extra-axial fluid collection. Vascular: No hyperdense vessel. Skull: Scalp soft tissues and calvarium within normal limits. Sinuses/Orbits: Globes and orbital soft tissues demonstrate no acute finding. Mild scattered mucosal thickening noted within the ethmoidal air cells. Paranasal sinuses mastoid air cells are otherwise clear. Other: None. ASPECTS [facility] Stroke Program Early CT Score) Does not apply due to ICH. IMPRESSION: 1. 2.3 x 2.1 x 2.9 cm acute intraparenchymal hemorrhage centered at the left thalamic capsular region, estimated volume 7 cc. Minimal surrounding vasogenic edema without significant regional mass effect or midline shift. Associated intraventricular extension with small volume intraventricular blood within the adjacent left lateral ventricle. No hydrocephalus or ventricular trapping. 2. No other acute intracranial abnormality. 3. Underlying age-related cerebral atrophy with mild chronic small vessel ischemic disease. These results were communicated to Dr. Otelia Limes at 6:43 pmon 10/6/2021by text page via the [facility] messaging system. Electronically Signed   By: Rise Mu M.D.   On: 05/05/2020 18:44     Labs:  CBC: Recent Labs    05/16/20 0242 05/17/20 0156 05/18/20 0309 05/19/20 0459  WBC 25.0* 22.4* 21.5* 19.1*  HGB 7.7* 7.4* 7.9* 7.3*  HCT 25.4* 24.5* 26.6* 24.7*  PLT 268 313 304 309    COAGS: Recent Labs    02/28/20 1155 05/05/20 1930  INR 1.1 1.2  APTT 31 32    BMP: Recent Labs    02/28/20 1008 02/28/20 1008 02/29/20 0455 05/05/20 1835 05/14/20 0117 05/15/20 0354 05/16/20 0242 05/18/20 0309  NA 139   < > 139   < > 147* 153* 151* 148*  K 3.8   < > 3.9   < > 4.0 4.2 4.1 4.1  CL 107   < > 110   < > 119* 122* 120* 118*  CO2 21*   < > 21*   < > 21* 20* 21* 23  GLUCOSE 103*   < > 98   < > 169* 171*  151* 147*  BUN 12   < > 8   < > 31* 35* 33* 30*  CALCIUM 9.1   < > 8.8*   < > 9.0 9.1 8.9 9.1  CREATININE 1.03   < > 0.86   < > 1.08 1.25* 1.16 1.01  GFRNONAA >60   < > >60   < > >60 >60 >60 >60  GFRAA >60  --  >60  --   --   --   --   --    < > = values in this interval not displayed.    LIVER FUNCTION TESTS: Recent Labs    05/11/20 0637 05/12/20 0510 05/16/20 0242 05/18/20 0309  BILITOT 0.7 0.7 0.7 0.8  AST 90* 118* 106* 115*  ALT 78* 86* 116* 124*  ALKPHOS 71 58 113 149*  PROT 7.8 6.7 7.2 7.3  ALBUMIN 2.6* 2.2* 2.2* 2.2*    TUMOR MARKERS: No results for input(s): AFPTM, CEA, CA199, CHROMGRNA in the last 8760 hours.  Assessment and Plan:  64 y/o M admitted for ICH with persistent dysphagia and aspiration pneumonia seen today for possible g-tube placement for ongoing nutrition.   Patient history and imaging reviewed by Dr. Fredia Sorrow who agrees to procedure.  Will tentatively plan for placement tomorrow (10/21) in IR -- patient will need 1 cup thin barium via NG at 8 pm tonight (call [phone] to have barium sent to floor) with repeat KUB at 0600 tomorrow AM (orders placed for both). Patient to be NPO/hold tube feedings starting at midnight on 10/21, no current anticoagulation per chart, AM labs pending.  Unable to reach patient's niece [name] via phone today for consent - will try again in AM and if still unable to reach we will have to reschedule.  Please call IR with questions or concerns.  Thank you for this interesting consult.  I greatly enjoyed meeting Ernie Kasler and look forward to participating in their care.  A copy of this report was sent to the requesting provider on this date.  Electronically Signed: Villa Herb, PA-C 05/19/2020, 11:49 AM   I spent a total of 40 Minutes in face to face in clinical consultation, greater than 50% of which was counseling/coordinating care for g-tube placement.

## 2020-05-19 NOTE — Progress Notes (Signed)
  Speech Language Pathology Treatment: Dysphagia  Patient Details Name: Alan Everett MRN: [id number] DOB: 1955/10/04 Today's Date: 05/19/2020 Time: 7371-0626 SLP Time Calculation (min) (ACUTE ONLY): 12 min  Assessment / Plan / Recommendation Clinical Impression  Pt was alert and more participatory today.  Oral care provided.  Pt verbalized intermittently - output was not intelligible- but when asked his name, he produced speech.  He did not produce automatic sequences despite multimodal cues. He did respond to yes/no questions about preferences with head nods and vocalizations, an improvement from prior sessions.   Pt accepted ice chips with attention to approaching spoon, improved effort at mastication, and palpable swallow response followed by intermittent throat-clearing/cough, consistent with prior sessions.  He remains appropriate for a PEG.  SLP will continue to follow for aphasia/dysphagia, ice chips trials.   HPI HPI: Alan Everett is a 64 y.o. male with a PMHx of alcohol use and tobacco abuse who presents acutely to the ED via EMS as a Code Stroke for acute onset of right sided weakness. Patient with disoriented, right leg weakness, right limb ataxia, right decreased sensation and Global aphasia on exam. 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 Worsening of intraventricular extension.  MBS 10/19 mod-severe dysphagia, continue NPO.       SLP Plan  Continue with current plan of care       Recommendations  Diet recommendations: NPO Medication Administration: Via alternative means                Oral Care Recommendations: Oral care QID Follow up Recommendations: Skilled Nursing facility SLP Visit Diagnosis: Dysphagia, oropharyngeal phase (R13.12) Plan: Continue with current plan of care       GO                Blenda Mounts Laurice 05/19/2020, 2:19 PM  Kier Smead L. Samson Frederic, MA CCC/SLP Acute Rehabilitation Services Office number [phone] Pager  [phone]

## 2020-05-19 NOTE — Progress Notes (Signed)
Please call [name] (niece) [phone] for consent for PEG placement. She will be at work and a voicemail will need to be left with a call back number so she can return the call.

## 2020-05-19 NOTE — Progress Notes (Signed)
PROGRESS NOTE    Alan Everett  ION:[id number] DOB: Dec 24, 1955 DOA: 05/05/2020 PCP: Patient, No Pcp Per   Brief Narrative:  HPI per [name] on 05/05/20 Alan Everett is an 64 y.o. male with a PMHx of alcohol use and tobacco abuse who presented to the ED via EMS as a Code Stroke for acute onset of right sided weakness. he also had sensory loss and weakness involving his RUE and RLE. 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 Neurosurgery was consulted for EVD consideration which was then deferred.  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  Transferred under [facility] on 05/09/2020.  On 05/08/2020, patient spiked fever and he was started on broad-spectrum antibiotics/IV Zosyn empirically.  Despite of antibiotics, patient's white blood cells as well as fever continue to get worse with a T-max of 103.2 at around noon on 05/11/2020.  Previous hospitalist [name] had discussed case with ID who recommended scanning his chest and abdomen and continuing antibiotics.  CT abdomen chest and pelvis shows bibasilar infiltrates suspecting possible pneumonia but no other pathology. 2D Echo EF 60-65%. UDS positive for cocaine  Assessment & Plan:   Active Problems:   ICH (intracerebral hemorrhage) (HCC)   Polysubstance abuse (HCC)   Dysphagia, post-stroke   Hyperglycemia   Hypokalemia   Hypomagnesemia   Fever   SOB (shortness of breath)   Leukocytosis   Aspiration pneumonia of both lower lobes (HCC)   Palliative care by specialist   Goals of care, counseling/discussion   DNR (do not resuscitate) discussion  Acute Hemorrhagic Stroke/cerebral edema - [name] ICH w/ IVH d/t severe hypertension in setting of alcohol and cocaine use - Code Stroke CT head 10/6 showed L thalamocapsular IPH 7cc w/ minimal edema, mild IVH. Small vessel disease. Atrophy.  - CT head 10/6 2042 enlarging L  thalamocapsular IPH w/ increasing IVH from L ventricle into 3rd and 4th ventricles. - CT head 10/7 0510 stable L thalamic IPH w/ IVH w/ clearing of blood in 4th ventricle.  - CTA head and neck 10/8 unremarkable angio, slight increase in midline shift and ventriculomegaly - MRI not able to perform due to no family contact to clear for metal - Repeat CT head 10/9 suboptimal but stable - 2D Echo EF 60-65% - UDS positive for cocaine - No antithrombotic prior to admission, now on No antithrombotic given IPH - Therapy recommendations:  CIR vs SNF PENDING admission - Repeat CT head on 05/12/2020 essentially showed stable left thalamic hemorrhage and intraventricular extension since 05/08/2020.  Regional edema, mild regional mass-effect and mild lateral ventriculomegaly and trace SAH.  Neurology signed off on 05/14/2020.  Hypertensive Emergency - Per EMS, SBP > 200  - Home meds:  none - Weaned off off Cleviprex. - Now On Amlodipine 10 and lisinopril 20 bid and as needed IV hydralazine.  Blood pressure mostly controlled with intermittent tachycardia and thus Lopressor was added which I will continue. - SBP goal < 160 - Long-term BP goal normotensive;  Hyperlipidemia - Home meds:  None - LDL 80, goal < 70 - No statin in setting of IPH - Mild transaminitis, likely related to alcohol, AST/ALT 68/64 ->52/47 -> 35/45 -> and is Now worsening and is 90/78 respectively - Will Consider statin low dose on discharge with normalized liver enzymes  Dysphagia - Secondary to stroke - C/W NPO; SLP on board.  Patient on tube feedings at  65 mL. cortrak in place. IR consulted for PEG tube placement given very poor progress with SLP.   Tobacco Abuse -Patient is a current smoker and will need smoking cessation counseling given once he is more awake  Cocaine abuse -UDS positive for cocaine -Cocaine cessation will be provided when he is more awake -Avoid  beta-blocker  Hypophosphatemia Resolved.  Hypokalemia Resolved.  Normocytic Anemia, unclear etiology -Patient's Hgb/Hct trending down from 12.0/38.3 -> 11.9/37.9 -> 10.7/34.1 and is trending down to 9.5/30.6 -> 8.7/28.4> 6.8/22.1 received 2 units of PRBC on 05/12/2020> 10.4/32.5. -He is having black stools so iron supplementation was stopped.  FOBT positive.  -Gastroenterology were consulted and recently had an EGD and colonoscopy over the last 2 months.  [name] recommends next up in evaluating would be a video capsule endoscopy but it cannot be formed due to his recent CVA and dysphagia and since his inability swallowed it would have to be done endoscopically and placed an EGD.  Currently endoscopic procedures are high risk with his recent acute CVA and anything small bowel pathology would require a deep enteroscopy or may not be reachable via endoscopically and likely the pathology would be intestinal angiectasia's.  [name] recommends continue to monitor carefully. 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  If recurrent transfusion needed, will call GI back.  Sepsis in the setting of Aspiration Pneumonia, likely POA.  -Likely in the setting of aspiration pneumonia. -CT chest shows bibasilar pneumonia.  CT abdomen pelvis negative.  Culture negative so far.  Last temperature 100.9 at 11 AM on 05/12/2020.  Afebrile since then.  ID thinks that his fever is coming from ICH.  However they added Flagyl to his current regime of Rocephin and Zithromax on 05/13/2020 and all antibiotics were discontinued on 05/15/2020 per ID recommendations.  Leukocytosis improving slowly. fortunately, he has remained afebrile since 05/12/2020.  GERD -C/w Pantorpazole 40 mg per Tube Daily qHS and will increase to twice daily   Hypernatremia/hyperchloremia -Mild hyperchloremia but  hyponatremia resolved.  Thrombocytopenia Resolved.  Hyperglycemia  Slightly hyperglycemic.  Will increase Lantus to 15 units and continue SSI.  DVT prophylaxis: SCDs Code Status: FULL CODE Family Communication: No family available - will attempt to contact later this afternoon. Disposition Plan: At this point in time, I do not expect much improvement in his neurological status.  Prognosis remains poor.  Family is aware but wants aggressive care.  Currently on tube feedings.  SLP managing.  MBS pending.  Only hurdle and discharged to SNF at this point in time is if this gentleman is going to need PEG tube before discharge.  Per palliative care note, family would like that if needed.  We will know more once MBS is done.  Status is: Inpatient  Remains inpatient appropriate because:Altered mental status, Unsafe d/c plan, IV treatments appropriate due to intensity of illness or inability to take PO and Inpatient level of care appropriate due to severity of illness   Dispo: The patient is from: Home              Anticipated d/c is to: CIR vs. SNF              Anticipated d/c date is: 3 days              Patient currently is not medically stable to d/c.  Consultants:  Neurology  Gastroenterology  Case was discussed with infectious diseases Dr. Daiva Eves   Antimicrobials:  Anti-infectives (From admission, onward)   Start     Dose/Rate Route Frequency Ordered Stop   05/14/20 1000  cefTRIAXone (ROCEPHIN) 2 g in sodium chloride 0.9 % 100 mL IVPB        2 g 200 mL/hr over 30 Minutes Intravenous Daily 05/13/20 1454 05/15/20 1255   05/13/20 2200  metroNIDAZOLE (FLAGYL) tablet 500 mg        500 mg Per Tube Every 8 hours 05/13/20 1847 05/15/20 2219   05/13/20 1400  metroNIDAZOLE (FLAGYL) tablet 500 mg  Status:  Discontinued        500 mg Oral Every 8 hours 05/13/20 0935 05/13/20 1847   05/12/20 0900  cefTRIAXone (ROCEPHIN) 1 g in sodium chloride 0.9 % 100 mL IVPB  Status:  Discontinued         1 g 200 mL/hr over 30 Minutes Intravenous Daily 05/12/20 0817 05/13/20 1454   05/12/20 0900  azithromycin (ZITHROMAX) 500 mg in sodium chloride 0.9 % 250 mL IVPB  Status:  Discontinued        500 mg 250 mL/hr over 60 Minutes Intravenous Daily 05/12/20 0817 05/13/20 0935   05/09/20 0930  Ampicillin-Sulbactam (UNASYN) 3 g in sodium chloride 0.9 % 100 mL IVPB  Status:  Discontinued        3 g 200 mL/hr over 30 Minutes Intravenous Every 8 hours 05/09/20 0837 05/09/20 0839   05/09/20 0930  Ampicillin-Sulbactam (UNASYN) 3 g in sodium chloride 0.9 % 100 mL IVPB  Status:  Discontinued        3 g 200 mL/hr over 30 Minutes Intravenous Every 6 hours 05/09/20 0839 05/12/20 0817        Subjective: Patient seen and examined.  Status quo.  Laying on the left side.  Favors left side.  Does not track at all.  Very dysarthric.  Completely disoriented as he has been for the last several days.  Objective: Vitals:   05/18/20 1936 05/18/20 2300 05/19/20 0319 05/19/20 0357  BP: 120/64 (!) 120/50 (!) 120/59   Pulse: (!) 115  (!) 107   Resp: Temp: 97.8 F (36.6 C) 98.3 F (36.8 C) 98.4 F (36.9 C)   TempSrc: Oral Axillary Oral   SpO2: 99% 97% 100%   Weight:    65.8 kg    Intake/Output Summary (Last 24 hours) at 05/19/2020 0736 Last data filed at 05/19/2020 0358 Gross per 24 hour  Intake --  Output 700 ml  Net -700 ml   Filed Weights   05/14/20 0500 05/17/20 0333 05/19/20 0357  Weight: 64.7 kg 65.5 kg 65.8 kg   Examination: Physical Exam:  General exam: Appears calm and comfortable and disoriented Respiratory system: Clear to auscultation. Respiratory effort normal. Cardiovascular system: S1 & S2 heard, RRR. No JVD, murmurs, rubs, gallops or clicks. No pedal edema. Gastrointestinal system: Abdomen is nondistended, soft and nontender. No organomegaly or masses felt. Normal bowel sounds heard. Central nervous system: Lethargic unable to assess orientation.  Not following any  commands for me to complete neuro exam.  Data Reviewed: I have personally reviewed following labs and imaging studies  CBC: Recent Labs  Lab 05/15/20 0354 05/16/20 0242 05/17/20 0156 05/18/20 0309 05/19/20 0459  WBC 29.2* 25.0* 22.4* 21.5* 19.1*  NEUTROABS 21.7* 18.1* 16.3* 15.6* 14.9*  HGB 8.3* 7.7* 7.4* 7.9* 7.3*  HCT 26.3* 25.4* 24.5* 26.6* 24.7*  MCV 93.3  98.4 97.2 97.1 100.0  PLT 287 268 313 304 309   Basic Metabolic Panel: Recent Labs  Lab 05/13/20 1009 05/14/20 0117 05/15/20 0354 05/16/20 0242 05/18/20 0309  NA 149* 147* 153* 151* 148*  K 4.0 4.0 4.2 4.1 4.1  CL 118* 119* 122* 120* 118*  CO2 19* 21* 20* 21* 23  GLUCOSE 154* 169* 171* 151* 147*  BUN 26* 31* 35* 33* 30*  CREATININE 1.08 1.08 1.25* 1.16 1.01  CALCIUM 8.7* 9.0 9.1 8.9 9.1  MG 2.4  --   --   --  2.5*   GFR: Estimated Creatinine Clearance: 66.7 mL/min (by C-G formula based on SCr of 1.01 mg/dL). Liver Function Tests: Recent Labs  Lab 05/16/20 0242 05/18/20 0309  AST 106* 115*  ALT 116* 124*  ALKPHOS 113 149*  BILITOT 0.7 0.8  PROT 7.2 7.3  ALBUMIN 2.2* 2.2*   No results for input(s): LIPASE, AMYLASE in the last 168 hours. No results for input(s): AMMONIA in the last 168 hours. Coagulation Profile: No results for input(s): INR, PROTIME in the last 168 hours. Cardiac Enzymes: No results for input(s): CKTOTAL, CKMB, CKMBINDEX, TROPONINI in the last 168 hours. BNP (last 3 results) No results for input(s): PROBNP in the last 8760 hours. HbA1C: No results for input(s): HGBA1C in the last 72 hours. CBG: Recent Labs  Lab 05/18/20 1129 05/18/20 1640 05/18/20 1940 05/18/20 2309 05/19/20 0322  GLUCAP 137* 147* 130* 146* 134*   Lipid Profile: No results for input(s): CHOL, HDL, LDLCALC, TRIG, CHOLHDL, LDLDIRECT in the last 72 hours. Thyroid Function Tests: No results for input(s): TSH, T4TOTAL, FREET4, T3FREE, THYROIDAB in the last 72 hours. Anemia Panel: No results for input(s):  VITAMINB12, FOLATE, FERRITIN, TIBC, IRON, RETICCTPCT in the last 72 hours. Sepsis Labs: No results for input(s): PROCALCITON, LATICACIDVEN in the last 168 hours.  Recent Results (from the past 240 hour(s))  Culture, blood (routine x 2)     Status: None   Collection Time: 05/09/20  8:27 AM   Specimen: BLOOD  Result Value Ref Range Status   Specimen Description BLOOD RIGHT ANTECUBITAL  Final   Special Requests   Final    AEROBIC BOTTLE ONLY Blood Culture results may not be optimal due to an inadequate volume of blood received in culture bottles   Culture   Final    NO GROWTH 5 DAYS Performed at [facility], [address] [place], [place] [address]    Report Status 05/14/2020 FINAL  Final  Culture, blood (routine x 2)     Status: None   Collection Time: 05/09/20  8:27 AM   Specimen: BLOOD  Result Value Ref Range Status   Specimen Description BLOOD RIGHT ANTECUBITAL  Final   Special Requests   Final    AEROBIC BOTTLE ONLY Blood Culture results may not be optimal due to an inadequate volume of blood received in culture bottles   Culture   Final    NO GROWTH 5 DAYS Performed at [facility], [address] [place], [place] [address]    Report Status 05/14/2020 FINAL  Final  Culture, Urine     Status: Abnormal   Collection Time: 05/09/20  8:40 AM   Specimen: Urine, Random  Result Value Ref Range Status   Specimen Description URINE, RANDOM  Final   Special Requests   Final    NONE Performed at [facility], [address] [place], [place] [address]    Culture >=100,000 COLONIES/mL  KLEBSIELLA PNEUMONIAE (A)  Final   Report Status 05/11/2020 FINAL  Final   Organism ID, Bacteria KLEBSIELLA PNEUMONIAE (A)  Final      Susceptibility   Klebsiella pneumoniae - MIC*    AMPICILLIN RESISTANT Resistant     CEFAZOLIN <=4 SENSITIVE Sensitive     CEFTRIAXONE <=0.25 SENSITIVE Sensitive     CIPROFLOXACIN <=0.25 SENSITIVE Sensitive     GENTAMICIN <=1 SENSITIVE Sensitive      IMIPENEM <=0.25 SENSITIVE Sensitive     NITROFURANTOIN 32 SENSITIVE Sensitive     TRIMETH/SULFA <=20 SENSITIVE Sensitive     AMPICILLIN/SULBACTAM 4 SENSITIVE Sensitive     PIP/TAZO <=4 SENSITIVE Sensitive     * >=100,000 COLONIES/mL KLEBSIELLA PNEUMONIAE     RN Pressure Injury Documentation:     Estimated body mass index is 23.41 kg/m as calculated from the following:   Height as of 04/09/20: 5\' 6"  (1.676 m).   Weight as of this encounter: 65.8 kg.  Malnutrition Type:  Nutrition Problem: Inadequate oral intake Etiology: dysphagia, acute illness   Malnutrition Characteristics:  Signs/Symptoms: NPO status   Nutrition Interventions:  Interventions: Tube feeding     Radiology Studies: DG Swallowing Func-Speech Pathology  Result Date: 05/18/2020 Objective Swallowing Evaluation: Type of Study: MBS-Modified Barium Swallow Study  Patient Details Name: Keiron Iodice MRN: Larry Sierras Date of Birth: [birth year] Today's Date: 05/18/2020 Time: SLP Start Time (ACUTE ONLY): 1302 -SLP Stop Time (ACUTE ONLY): 1322 SLP Time Calculation (min) (ACUTE ONLY): 20 min Past Medical History: Past Medical History: Diagnosis Date . Alcohol use  . Tobacco abuse  Past Surgical History: Past Surgical History: Procedure Laterality Date . COLONOSCOPY N/A 03/01/2020  Procedure: COLONOSCOPY;  Surgeon: Toledo, 05/01/2020, MD;  Location: [facility] ENDOSCOPY;  Service: Gastroenterology;  Laterality: N/A; . ESOPHAGOGASTRODUODENOSCOPY N/A 03/01/2020  Procedure: ESOPHAGOGASTRODUODENOSCOPY (EGD);  Surgeon: Toledo, 05/01/2020, MD;  Location: [facility] ENDOSCOPY;  Service: Gastroenterology;  Laterality: N/A; . FOOT FRACTURE SURGERY Left  HPI: Brayen Bunn is a 64 y.o. male with a PMHx of alcohol use and tobacco abuse who presents acutely to the ED via EMS as a Code Stroke for acute onset of right sided weakness. Patient with disoriented, right leg weakness, right limb ataxia, right decreased sensation and Global aphasia on exam. CT head  reveals an acute left thalamocapsular hemorrhage. Worsening of intraventricular  Subjective: drowsy Assessment / Plan / Recommendation CHL IP CLINICAL IMPRESSIONS 05/18/2020 Clinical Impression Pt participated in limited MBS study, able to consume only small quantities of thin barium.  He required physical assistance to keep his head upright and centered given tendency for flexion to the left.  There was minimal purposeful effort to manipulate the bolus material, which spilled freely from right side of oral cavity, spread diffusely throughout mouth with poor cohesion, and eventually spilled passively into pharynx.  Liquids filled the vallecular and pyriform spaces.  Swallow initiation was significantly delayed, eventually triggering, followed by immediate and explosive coughing which caused his head/neck to thrust forward and he was no longer visible in frame.  Difficult to visualize barium - there appeared to be trace amount in anterior trachea.  Study ceased at this time due to poor participation.  Given pt's lethargy, inconsistent participation, and degree of sensory and motor impairment, family should consider longer-term enteral feeding.  Recovery of swallow function may be prolonged.  Continue NPO; SLP will continue to follow for aphasia and dysphagia tx as pt is able to participate.   SLP Visit Diagnosis Dysphagia, oropharyngeal phase (R13.12) Attention and concentration  deficit following -- Frontal lobe and executive function deficit following -- Impact on safety and function Severe aspiration risk   CHL IP TREATMENT RECOMMENDATION 05/18/2020 Treatment Recommendations Therapy as outlined in treatment plan below   Prognosis 05/18/2020 Prognosis for Safe Diet Advancement Fair Barriers to Reach Goals Cognitive deficits Barriers/Prognosis Comment -- CHL IP DIET RECOMMENDATION 05/18/2020 SLP Diet Recommendations NPO Liquid Administration via -- Medication Administration Via alternative means Compensations --  Postural Changes --   CHL IP OTHER RECOMMENDATIONS 05/18/2020 Recommended Consults -- Oral Care Recommendations Oral care QID Other Recommendations --   CHL IP FOLLOW UP RECOMMENDATIONS 05/18/2020 Follow up Recommendations Inpatient Rehab   CHL IP FREQUENCY AND DURATION 05/18/2020 Speech Therapy Frequency (ACUTE ONLY) min 2x/week Treatment Duration 2 weeks      CHL IP ORAL PHASE 05/18/2020 Oral Phase Impaired Oral - Pudding Teaspoon -- Oral - Pudding Cup -- Oral - Honey Teaspoon -- Oral - Honey Cup -- Oral - Nectar Teaspoon -- Oral - Nectar Cup -- Oral - Nectar Straw -- Oral - Thin Teaspoon Right anterior bolus loss;Weak lingual manipulation;Reduced posterior propulsion;Holding of bolus;Right pocketing in lateral sulci;Pocketing in anterior sulcus;Delayed oral transit;Decreased bolus cohesion;Premature spillage Oral - Thin Cup Right anterior bolus loss;Weak lingual manipulation;Reduced posterior propulsion;Holding of bolus;Right pocketing in lateral sulci;Pocketing in anterior sulcus;Delayed oral transit;Decreased bolus cohesion;Premature spillage Oral - Thin Straw -- Oral - Puree -- Oral - Mech Soft -- Oral - Regular -- Oral - Multi-Consistency -- Oral - Pill -- Oral Phase - Comment --  CHL IP PHARYNGEAL PHASE 05/18/2020 Pharyngeal Phase Impaired Pharyngeal- Pudding Teaspoon -- Pharyngeal -- Pharyngeal- Pudding Cup -- Pharyngeal -- Pharyngeal- Honey Teaspoon -- Pharyngeal -- Pharyngeal- Honey Cup -- Pharyngeal -- Pharyngeal- Nectar Teaspoon -- Pharyngeal -- Pharyngeal- Nectar Cup -- Pharyngeal -- Pharyngeal- Nectar Straw -- Pharyngeal -- Pharyngeal- Thin Teaspoon Delayed swallow initiation-pyriform sinuses;Reduced airway/laryngeal closure;Reduced tongue base retraction;Trace aspiration Pharyngeal -- Pharyngeal- Thin Cup Delayed swallow initiation-pyriform sinuses;Reduced airway/laryngeal closure;Reduced tongue base retraction;Trace aspiration Pharyngeal -- Pharyngeal- Thin Straw -- Pharyngeal -- Pharyngeal- Puree  -- Pharyngeal -- Pharyngeal- Mechanical Soft -- Pharyngeal -- Pharyngeal- Regular -- Pharyngeal -- Pharyngeal- Multi-consistency -- Pharyngeal -- Pharyngeal- Pill -- Pharyngeal -- Pharyngeal Comment --  No flowsheet data found. Blenda Mounts Laurice 05/18/2020, 1:49 PM              Scheduled Meds: . amLODipine  10 mg Per Tube Daily  . chlorhexidine  15 mL Mouth Rinse BID  . free water  150 mL Per Tube Q6H  . insulin aspart  0-9 Units Subcutaneous Q4H  . insulin glargine  15 Units Subcutaneous Daily  . lisinopril  20 mg Per Tube BID  . mouth rinse  15 mL Mouth Rinse q12n4p  . metoprolol tartrate  25 mg Per Tube BID  . pantoprazole sodium  40 mg Per Tube BID  . senna-docusate  1 tablet Per Tube BID  . sodium chloride flush  10-40 mL Intracatheter Q12H   Continuous Infusions: . feeding supplement (OSMOLITE 1.2 CAL) 1,000 mL (05/18/20 1822)    LOS: 14 days   Azucena Fallen, DO [facility] PAGER on secure chat  If 7PM-7AM, please contact night-coverage [email or web address]

## 2020-05-20 ENCOUNTER — Inpatient Hospital Stay (HOSPITAL_COMMUNITY): Payer: Medicaid Other

## 2020-05-20 LAB — COMPREHENSIVE METABOLIC PANEL
ALT: 124 U/L — ABNORMAL HIGH (ref 0–44)
AST: 101 U/L — ABNORMAL HIGH (ref 15–41)
Albumin: 2.2 g/dL — ABNORMAL LOW (ref 3.5–5.0)
Alkaline Phosphatase: 158 U/L — ABNORMAL HIGH (ref 38–126)
Anion gap: 10 (ref 5–15)
BUN: 42 mg/dL — ABNORMAL HIGH (ref 8–23)
CO2: 19 mmol/L — ABNORMAL LOW (ref 22–32)
Calcium: 9.2 mg/dL (ref 8.9–10.3)
Chloride: 121 mmol/L — ABNORMAL HIGH (ref 98–111)
Creatinine, Ser: 1.28 mg/dL — ABNORMAL HIGH (ref 0.61–1.24)
GFR, Estimated: >60 mL/min (ref >60)
Glucose, Bld: 96 mg/dL (ref 70–99)
Potassium: 4.2 mmol/L (ref 3.5–5.1)
Sodium: 150 mmol/L — ABNORMAL HIGH (ref 135–145)
Total Bilirubin: 0.6 mg/dL (ref 0.3–1.2)
Total Protein: 7.5 g/dL (ref 6.5–8.1)

## 2020-05-20 LAB — CBC WITH DIFFERENTIAL/PLATELET
Abs Immature Granulocytes: 0.48 10*3/uL — ABNORMAL HIGH (ref 0.00–0.07)
Basophils Absolute: 0.1 10*3/uL (ref 0.0–0.1)
Basophils Relative: 0 %
Eosinophils Absolute: 0.4 10*3/uL (ref 0.0–0.5)
Eosinophils Relative: 3 %
HCT: 24.8 % — ABNORMAL LOW (ref 39.0–52.0)
Hemoglobin: 7.3 g/dL — ABNORMAL LOW (ref 13.0–17.0)
Immature Granulocytes: 3 %
Lymphocytes Relative: 8 %
Lymphs Abs: 1.3 10*3/uL (ref 0.7–4.0)
MCH: 29.6 pg (ref 26.0–34.0)
MCHC: 29.4 g/dL — ABNORMAL LOW (ref 30.0–36.0)
MCV: 100.4 fL — ABNORMAL HIGH (ref 80.0–100.0)
Monocytes Absolute: 1.3 10*3/uL — ABNORMAL HIGH (ref 0.1–1.0)
Monocytes Relative: 8 %
Neutro Abs: 12.1 10*3/uL — ABNORMAL HIGH (ref 1.7–7.7)
Neutrophils Relative %: 78 %
Platelets: 307 10*3/uL (ref 150–400)
RBC: 2.47 MIL/uL — ABNORMAL LOW (ref 4.22–5.81)
RDW: 18.6 % — ABNORMAL HIGH (ref 11.5–15.5)
WBC: 15.7 10*3/uL — ABNORMAL HIGH (ref 4.0–10.5)
nRBC: 0.1 % (ref 0.0–0.2)

## 2020-05-20 LAB — GLUCOSE, CAPILLARY
Glucose-Capillary: 138 mg/dL — ABNORMAL HIGH (ref 70–99)
Glucose-Capillary: 98 mg/dL (ref 70–99)
Glucose-Capillary: 99 mg/dL (ref 70–99)
Glucose-Capillary: 90 mg/dL (ref 70–99)
Glucose-Capillary: 133 mg/dL — ABNORMAL HIGH (ref 70–99)
Glucose-Capillary: 121 mg/dL — ABNORMAL HIGH (ref 70–99)

## 2020-05-20 LAB — PROTIME-INR
INR: 1.1 (ref 0.8–1.2)
Prothrombin Time: 13.6 seconds (ref 11.4–15.2)

## 2020-05-20 LAB — SURGICAL PCR SCREEN
MRSA, PCR: NEGATIVE
Staphylococcus aureus: NEGATIVE

## 2020-05-20 NOTE — Progress Notes (Signed)
Barium administered via CT at this time cause item just available.

## 2020-05-20 NOTE — Progress Notes (Signed)
Physical Therapy Treatment Patient Details Name: Alan Everett MRN: [id number] DOB: January 11, 1956 Today's Date: 05/20/2020    History of Present Illness Patient is a 64 y/o male with PMH ETOH use, tobacco abuse, admitted with R side weakness,sensory loss and speech deficits.  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    PT Comments    Pt tolerates treatment well although remains unable to consistently follow commands. Pt does follow motor commands twice to extend neck but otherwise does not follow. Pt continues to demonstrate very poor sitting balance, requiring LUE support or and physical assistance to prevent LOB. PT attempts stand with pt but the pt is able to initiate minimally. Pt will continue to benefit from PT POC to reduce caregiver burden and falls risk. PT continues to recommend SNF placement.  Follow Up Recommendations  SNF;Supervision/Assistance - 24 hour     Equipment Recommendations  Wheelchair cushion (measurements PT);Wheelchair (measurements PT);Hospital bed;Other (comment)    Recommendations for Other Services       Precautions / Restrictions Precautions Precautions: Fall Precaution Comments: soft cuff restraint L wrist, pushes to Rt, coretrack, BP <180 Restrictions Weight Bearing Restrictions: No    Mobility  Bed Mobility Overal bed mobility: Needs Assistance Bed Mobility: Supine to Sit;Sit to Supine;Rolling Rolling: Total assist   Supine to sit: Max assist;HOB elevated Sit to supine: Total assist      Transfers Overall transfer level: Needs assistance   Transfers: Sit to/from Stand Sit to Stand: Total assist         General transfer comment: PT provides BUE support and R knee block. Pt without LE activation until almost in full erect standing then PT feels activation of LLE. Pt quickly fatigues in ~5 seconds  Ambulation/Gait                 Stairs              Wheelchair Mobility    Modified Rankin (Stroke Patients Only) Modified Rankin (Stroke Patients Only) Pre-Morbid Rankin Score: No symptoms Modified Rankin: Severe disability     Balance Overall balance assessment: Needs assistance Sitting-balance support: No upper extremity supported;Feet supported;Single extremity supported Sitting balance-Leahy Scale: Poor Sitting balance - Comments: reliant on UE support and min-modA or lean onto L elbow Postural control: Left lateral lean;Posterior lean Standing balance support: Bilateral upper extremity supported Standing balance-Leahy Scale: Zero Standing balance comment: totalA                            Cognition Arousal/Alertness: Awake/alert Behavior During Therapy: Flat affect Overall Cognitive Status: Difficult to assess                                 General Comments: pt does not follow commands, only nods head twice during session when asked if the pt is ok      Exercises Other Exercises Other Exercises: PROM L shoulder flexion, elbow flexion/extension, wrist flex/ext/ finger flex/ext and cervical right lateral flexion and rotation for 10 reps    General Comments        Pertinent Vitals/Pain Pain Assessment: Faces Faces Pain Scale: Hurts little more Pain Location: grimace with neck PROM Pain Descriptors / Indicators: Grimacing Pain Intervention(s): Monitored during session    Home Living  Prior Function            PT Goals (current goals can now be found in the care plan section) Acute Rehab PT Goals Patient Stated Goal: unable to state Time For Goal Achievement: 06/03/20 Potential to Achieve Goals: Poor Progress towards PT goals: Not progressing toward goals - comment (unable to consistently follow commands)    Frequency    Min 3X/week      PT Plan Current plan remains appropriate    Co-evaluation              AM-PAC PT "6 Clicks"  Mobility   Outcome Measure  Help needed turning from your back to your side while in a flat bed without using bedrails?: Total Help needed moving from lying on your back to sitting on the side of a flat bed without using bedrails?: Total Help needed moving to and from a bed to a chair (including a wheelchair)?: Total Help needed standing up from a chair using your arms (e.g., wheelchair or bedside chair)?: Total Help needed to walk in hospital room?: Total Help needed climbing 3-5 steps with a railing? : Total 6 Click Score: 6    End of Session   Activity Tolerance: Patient tolerated treatment well Patient left: in bed;with bed alarm set;with call bell/phone within reach;with restraints reapplied;with SCD's reapplied Nurse Communication: Mobility status;Need for lift equipment PT Visit Diagnosis: Unsteadiness on feet (R26.81);Muscle weakness (generalized) (M62.81);Difficulty in walking, not elsewhere classified (R26.2);Other symptoms and signs involving the nervous system (R29.898);Hemiplegia and hemiparesis Hemiplegia - Right/Left: Right Hemiplegia - dominant/non-dominant: Dominant Hemiplegia - caused by: Nontraumatic intracerebral hemorrhage     Time: 1694-5038 PT Time Calculation (min) (ACUTE ONLY): 23 min  Charges:  $Therapeutic Exercise: 8-22 mins $Therapeutic Activity: 8-22 mins                     Arlyss Gandy, PT, DPT Acute Rehabilitation Pager: [phone]    Arlyss Gandy 05/20/2020, 5:36 PM

## 2020-05-20 NOTE — Progress Notes (Signed)
PROGRESS NOTE    Alan Everett  ZOX:[id number] DOB: Mar 31, 1956 DOA: 05/05/2020 PCP: Patient, No Pcp Per   Brief Narrative:  HPI per Dr. Caryl PinaEric Everett on 05/05/20 Alan Everett is an 64 y.o. male with a PMHx of alcohol use and tobacco abuse who presented to the ED via EMS as a Code Stroke for acute onset of right sided weakness. he also had sensory loss and weakness involving his RUE and RLE. 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 Neurosurgery was consulted for EVD consideration which was then deferred.  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  Transferred under TR[facility] on 05/09/2020.  On 05/08/2020, patient spiked fever and he was started on broad-spectrum antibiotics/IV Zosyn empirically.  Despite of antibiotics, patient's white blood cells as well as fever continue to get worse with a T-max of 103.2 at around noon on 05/11/2020.  Previous hospitalist Dr. Richardson Everett had discussed case with ID who recommended scanning his chest and abdomen and continuing antibiotics.  CT abdomen chest and pelvis shows bibasilar infiltrates suspecting possible pneumonia but no other pathology. 2D Echo EF 60-65%. UDS positive for cocaine  Assessment & Plan:   Active Problems:   ICH (intracerebral hemorrhage) (HCC)   Polysubstance abuse (HCC)   Dysphagia, post-stroke   Hyperglycemia   Hypokalemia   Hypomagnesemia   Fever   SOB (shortness of breath)   Leukocytosis   Aspiration pneumonia of both lower lobes (HCC)   Palliative care by specialist   Goals of care, counseling/discussion   DNR (do not resuscitate) discussion   Acute Hemorrhagic Stroke/cerebral edema - [name] ICH w/ IVH d/t severe hypertension in setting of alcohol and cocaine use - Code Stroke CT head 10/6 showed L thalamocapsular IPH 7cc w/ minimal edema, mild IVH. Small vessel disease. Atrophy.  - CT head 10/6 2042 enlarging L  thalamocapsular IPH w/ increasing IVH from L ventricle into 3rd and 4th ventricles. - CT head 10/7 0510 stable L thalamic IPH w/ IVH w/ clearing of blood in 4th ventricle.  - CTA head and neck 10/8 unremarkable angio, slight increase in midline shift and ventriculomegaly - MRI not able to perform due to no family contact to clear for metal - Repeat CT head 10/9 suboptimal but stable - 2D Echo EF 60-65% - UDS positive for cocaine - No antithrombotic prior to admission, now on No antithrombotic given IPH - Therapy recommendations:  CIR vs SNF PENDING admission - Repeat CT head on 05/12/2020 essentially showed stable left thalamic hemorrhage and intraventricular extension since 05/08/2020.  Regional edema, mild regional mass-effect and mild lateral ventriculomegaly and trace SAH.  Neurology signed off on 05/14/2020.  Hypertensive Emergency - Per EMS, SBP > 200  - Home meds:  none - Weaned off off Cleviprex. - Now On Amlodipine 10 and lisinopril 20 bid and as needed IV hydralazine.  Blood pressure mostly controlled with intermittent tachycardia and thus Lopressor was added which I will continue. - SBP goal < 160 - Long-term BP goal normotensive;  Hyperlipidemia - Home meds:  None - LDL 80, goal < 70 - No statin in setting of IPH   Mildly elevated AST/ALT    Component Value Date/Time   PROT 7.5 05/20/2020 0758   ALBUMIN 2.2 (L) 05/20/2020 0758   AST 101 (H) 05/20/2020 0758   ALT 124 (H) 05/20/2020 0758   ALKPHOS 158 (H) 05/20/2020 0758   BILITOT 0.6 05/20/2020  0758   BILIDIR 0.2 05/09/2020 0827   IBILI 0.3 05/09/2020 0827    Dysphagia - Secondary to stroke - C/W NPO; SLP on board.  Patient on tube feedings at 65 mL. cortrak in place. IR consulted for PEG tube placement given very poor progress with SLP.   Tobacco Abuse -Patient is a current smoker and will need smoking cessation counseling given once he is more awake  Cocaine abuse -UDS positive for cocaine -Cocaine  cessation will be provided when he is more awake -Avoid beta-blocker  Hypophosphatemia Resolved.  Hypokalemia Resolved.  Normocytic Anemia, unclear etiology -Patient's Hgb/Hct trending down from 12.0/38.3 -> 11.9/37.9 -> 10.7/34.1 and is trending down to 9.5/30.6 -> 8.7/28.4> 6.8/22.1 received 2 units of PRBC on 05/12/2020> 10.4/32.5. -He is having black stools so iron supplementation was stopped.  FOBT positive.  -Gastroenterology were consulted and recently had an EGD and colonoscopy over the last 2 months.  Dr. Rhea Everett recommends next up in evaluating would be a video capsule endoscopy but it cannot be formed due to his recent CVA and dysphagia and since his inability swallowed it would have to be done endoscopically and placed an EGD.  Currently endoscopic procedures are high risk with his recent acute CVA and anything small bowel pathology would require a deep enteroscopy or may not be reachable via endoscopically and likely the pathology would be intestinal angiectasia's.  Dr. Sharla Everett recommends continue to monitor carefully. 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  If recurrent transfusion needed, will call GI back.  Sepsis in the setting of Aspiration Pneumonia, likely POA.  -Likely in the setting of aspiration pneumonia. -CT chest shows bibasilar pneumonia.  CT abdomen pelvis negative.  Culture negative so far.  Last temperature 100.9 at 11 AM on 05/12/2020.  Afebrile since then.  ID thinks that his fever is coming from ICH.  However they added Flagyl to his current regime of Rocephin and Zithromax on 05/13/2020 and all antibiotics were discontinued on 05/15/2020 per ID recommendations.  Leukocytosis improving slowly. fortunately, he has remained afebrile since 05/12/2020.  GERD -C/w Pantorpazole 40 mg per Tube Daily qHS and will increase to twice daily    Hypernatremia/hyperchloremia -Mild hyperchloremia but hyponatremia resolved.  Thrombocytopenia Resolved.  Hyperglycemia  Slightly hyperglycemic.  Will increase Lantus to 15 units and continue SSI.  DVT prophylaxis: SCDs Code Status: FULL CODE Family Communication: Girlfriend at bedside, lengthy discussion about need for PEG tube and transition to physical therapy/rehab in the next few days, she is agreeable. Disposition Plan: Mental status stable, continues to fail speech eval, planned for PEG tube placement in the next 24 hours, once PEG tube is established patient will be medically stable for discharge to SNF per family wishes.  Status is: Inpatient  Remains inpatient appropriate because:Altered mental status, Unsafe d/c plan, IV treatments appropriate due to intensity of illness or inability to take PO and Inpatient level of care appropriate due to severity of illness   Dispo: The patient is from: Home              Anticipated d/c is to: CIR vs. SNF              Anticipated d/c date is: 1 day              Patient currently is currently medically stable for discharge pending PEG tube placement for definitive  access for feeding and medication administration.  Consultants:   Neurology  Gastroenterology  Case was discussed with infectious diseases Dr. Daiva Eves   Antimicrobials:  Anti-infectives (From admission, onward)   Start     Dose/Rate Route Frequency Ordered Stop   05/14/20 1000  cefTRIAXone (ROCEPHIN) 2 g in sodium chloride 0.9 % 100 mL IVPB        2 g 200 mL/hr over 30 Minutes Intravenous Daily 05/13/20 1454 05/15/20 1255   05/13/20 2200  metroNIDAZOLE (FLAGYL) tablet 500 mg        500 mg Per Tube Every 8 hours 05/13/20 1847 05/15/20 2219   05/13/20 1400  metroNIDAZOLE (FLAGYL) tablet 500 mg  Status:  Discontinued        500 mg Oral Every 8 hours 05/13/20 0935 05/13/20 1847   05/12/20 0900  cefTRIAXone (ROCEPHIN) 1 g in sodium chloride 0.9 % 100 mL IVPB  Status:   Discontinued        1 g 200 mL/hr over 30 Minutes Intravenous Daily 05/12/20 0817 05/13/20 1454   05/12/20 0900  azithromycin (ZITHROMAX) 500 mg in sodium chloride 0.9 % 250 mL IVPB  Status:  Discontinued        500 mg 250 mL/hr over 60 Minutes Intravenous Daily 05/12/20 0817 05/13/20 0935   05/09/20 0930  Ampicillin-Sulbactam (UNASYN) 3 g in sodium chloride 0.9 % 100 mL IVPB  Status:  Discontinued        3 g 200 mL/hr over 30 Minutes Intravenous Every 8 hours 05/09/20 0837 05/09/20 0839   05/09/20 0930  Ampicillin-Sulbactam (UNASYN) 3 g in sodium chloride 0.9 % 100 mL IVPB  Status:  Discontinued        3 g 200 mL/hr over 30 Minutes Intravenous Every 6 hours 05/09/20 0839 05/12/20 0817        Subjective: Patient seen and examined.  Status quo.  Laying on the left side.  Favors left side.  Does not track at all.  Very dysarthric.  Completely disoriented as he has been for the last several days.  Objective: Vitals:   05/20/20 0539 05/20/20 0729 05/20/20 1000 05/20/20 1139  BP:  (!) 100/58 104/67 (P) 103/61  Pulse:  95 (!) 104 (P) 99  Resp:  20 20 (P) 20  Temp:  98 F (36.7 C) 98.6 F (37 C) (P) 98.1 F (36.7 C)  TempSrc:  Oral Oral (P) Oral  SpO2:  100% 98% (P) 97%  Weight: 63.3 kg       Intake/Output Summary (Last 24 hours) at 05/20/2020 1324 Last data filed at 05/20/2020 1000 Gross per 24 hour  Intake 80 ml  Output 625 ml  Net -545 ml   Filed Weights   05/17/20 0333 05/19/20 0357 05/20/20 0539  Weight: 65.5 kg 65.8 kg 63.3 kg   Examination: Physical Exam:  General exam: Appears calm and comfortable and disoriented Respiratory system: Clear to auscultation. Respiratory effort normal. Cardiovascular system: S1 & S2 heard, RRR. No JVD, murmurs, rubs, gallops or clicks. No pedal edema. Gastrointestinal system: Abdomen is nondistended, soft and nontender. No organomegaly or masses felt. Normal bowel sounds heard. Central nervous system: Lethargic unable to assess  orientation.  Not following any commands in any meaningful way.  Data Reviewed: I have personally reviewed following labs and imaging studies  CBC: Recent Labs  Lab 05/16/20 0242 05/17/20 0156 05/18/20 0309 05/19/20 0459 05/20/20 0248  WBC 25.0* 22.4* 21.5* 19.1* 15.7*  NEUTROABS 18.1* 16.3* 15.6* 14.9* 12.1*  HGB 7.7* 7.4*  7.9* 7.3* 7.3*  HCT 25.4* 24.5* 26.6* 24.7* 24.8*  MCV 98.4 97.2 97.1 100.0 100.4*  PLT 268 313 304 309 307   Basic Metabolic Panel: Recent Labs  Lab 05/14/20 0117 05/15/20 0354 05/16/20 0242 05/18/20 0309 05/20/20 0758  NA 147* 153* 151* 148* 150*  K 4.0 4.2 4.1 4.1 4.2  CL 119* 122* 120* 118* 121*  CO2 21* 20* 21* 23 19*  GLUCOSE 169* 171* 151* 147* 96  BUN 31* 35* 33* 30* 42*  CREATININE 1.08 1.25* 1.16 1.01 1.28*  CALCIUM 9.0 9.1 8.9 9.1 9.2  MG  --   --   --  2.5*  --    GFR: Estimated Creatinine Clearance: 52.2 mL/min (A) (by C-G formula based on SCr of 1.28 mg/dL (H)). Liver Function Tests: Recent Labs  Lab 05/16/20 0242 05/18/20 0309 05/20/20 0758  AST 106* 115* 101*  ALT 116* 124* 124*  ALKPHOS 113 149* 158*  BILITOT 0.7 0.8 0.6  PROT 7.2 7.3 7.5  ALBUMIN 2.2* 2.2* 2.2*   No results for input(s): LIPASE, AMYLASE in the last 168 hours. No results for input(s): AMMONIA in the last 168 hours. Coagulation Profile: Recent Labs  Lab 05/20/20 0248  INR 1.1   Cardiac Enzymes: No results for input(s): CKTOTAL, CKMB, CKMBINDEX, TROPONINI in the last 168 hours. BNP (last 3 results) No results for input(s): PROBNP in the last 8760 hours. HbA1C: No results for input(s): HGBA1C in the last 72 hours. CBG: Recent Labs  Lab 05/19/20 1934 05/19/20 2331 05/20/20 0353 05/20/20 0734 05/20/20 1137  GLUCAP 137* 134* 138* 98 99   Lipid Profile: No results for input(s): CHOL, HDL, LDLCALC, TRIG, CHOLHDL, LDLDIRECT in the last 72 hours. Thyroid Function Tests: No results for input(s): TSH, T4TOTAL, FREET4, T3FREE, THYROIDAB in the last  72 hours. Anemia Panel: No results for input(s): VITAMINB12, FOLATE, FERRITIN, TIBC, IRON, RETICCTPCT in the last 72 hours. Sepsis Labs: No results for input(s): PROCALCITON, LATICACIDVEN in the last 168 hours.  Recent Results (from the past 240 hour(s))  Surgical pcr screen     Status: None   Collection Time: 05/20/20  3:00 AM   Specimen: Nasal Mucosa; Nasal Swab  Result Value Ref Range Status   MRSA, PCR NEGATIVE NEGATIVE Final   Staphylococcus aureus NEGATIVE NEGATIVE Final    Comment: (NOTE) The Xpert SA Assay (FDA approved for NASAL specimens in patients 66 years of age and older), is one component of a comprehensive surveillance program. It is not intended to diagnose infection nor to guide or monitor treatment. Performed at [facility], [address] [place], [place] [address]      RN Pressure Injury Documentation:     Estimated body mass index is 22.52 kg/m as calculated from the following:   Height as of 04/09/20:  (1.676 m).   Weight as of this encounter: 63.3 kg.  Malnutrition Type:  Nutrition Problem: Inadequate oral intake Etiology: dysphagia, acute illness   Malnutrition Characteristics:  Signs/Symptoms: NPO status   Nutrition Interventions:  Interventions: Tube feeding     Radiology Studies: DG Abd Portable 1V  Result Date: 05/20/2020 CLINICAL DATA:  History of dysphagia. EXAM: PORTABLE ABDOMEN - 1 VIEW COMPARISON:  CT report 05/11/2020. FINDINGS: Feeding tube noted with tip over the distal stomach/proximal duodenum. Previously administered barium noted throughout the colon and rectum. Mild residual noted in the stomach. Lumbar scoliosis concave left. Aortoiliac atherosclerotic vascular calcification. IMPRESSION: Feeding tube noted with tip over the distal stomach/proximal duodenum. Previously  administered barium noted throughout the colon and rectum. Mild residual noted in the stomach. Electronically Signed   By: Maisie Fus  Register   On:  05/20/2020 06:35   Scheduled Meds: . amLODipine  10 mg Per Tube Daily  . chlorhexidine  15 mL Mouth Rinse BID  . free water  150 mL Per Tube Q6H  . insulin aspart  0-9 Units Subcutaneous Q4H  . insulin glargine  15 Units Subcutaneous Daily  . lisinopril  20 mg Per Tube BID  . mouth rinse  15 mL Mouth Rinse q12n4p  . metoprolol tartrate  25 mg Per Tube BID  . pantoprazole sodium  40 mg Per Tube BID  . senna-docusate  1 tablet Per Tube BID  . sodium chloride flush  10-40 mL Intracatheter Q12H   Continuous Infusions: . feeding supplement (OSMOLITE 1.2 CAL) Stopped (05/20/20 0700)    LOS: 15 days   Azucena Fallen, DO [facility] PAGER on secure chat  If 7PM-7AM, please contact night-coverage [email or web address]

## 2020-05-21 ENCOUNTER — Inpatient Hospital Stay (HOSPITAL_COMMUNITY): Payer: Medicaid Other

## 2020-05-21 DIAGNOSIS — E43 Unspecified severe protein-calorie malnutrition: Secondary | ICD-10-CM | POA: Insufficient documentation

## 2020-05-21 HISTORY — PX: IR GASTROSTOMY TUBE MOD SED: IMG625

## 2020-05-21 LAB — COMPREHENSIVE METABOLIC PANEL
ALT: 166 U/L — ABNORMAL HIGH (ref 0–44)
AST: 146 U/L — ABNORMAL HIGH (ref 15–41)
Albumin: 2.4 g/dL — ABNORMAL LOW (ref 3.5–5.0)
Alkaline Phosphatase: 212 U/L — ABNORMAL HIGH (ref 38–126)
Anion gap: 8 (ref 5–15)
BUN: 52 mg/dL — ABNORMAL HIGH (ref 8–23)
CO2: 25 mmol/L (ref 22–32)
Calcium: 9.3 mg/dL (ref 8.9–10.3)
Chloride: 120 mmol/L — ABNORMAL HIGH (ref 98–111)
Creatinine, Ser: 1.33 mg/dL — ABNORMAL HIGH (ref 0.61–1.24)
GFR, Estimated: 60 mL/min — ABNORMAL LOW (ref >60)
Glucose, Bld: 93 mg/dL (ref 70–99)
Potassium: 4 mmol/L (ref 3.5–5.1)
Sodium: 153 mmol/L — ABNORMAL HIGH (ref 135–145)
Total Bilirubin: 0.5 mg/dL (ref 0.3–1.2)
Total Protein: 7.9 g/dL (ref 6.5–8.1)

## 2020-05-21 LAB — CBC WITH DIFFERENTIAL/PLATELET
Abs Immature Granulocytes: 0.23 K/uL — ABNORMAL HIGH (ref 0.00–0.07)
Basophils Absolute: 0.1 K/uL (ref 0.0–0.1)
Basophils Relative: 1 %
Eosinophils Absolute: 0.4 K/uL (ref 0.0–0.5)
Eosinophils Relative: 4 %
HCT: 25.3 % — ABNORMAL LOW (ref 39.0–52.0)
Hemoglobin: 7.3 g/dL — ABNORMAL LOW (ref 13.0–17.0)
Immature Granulocytes: 2 %
Lymphocytes Relative: 11 %
Lymphs Abs: 1.1 K/uL (ref 0.7–4.0)
MCH: 29.6 pg (ref 26.0–34.0)
MCHC: 28.9 g/dL — ABNORMAL LOW (ref 30.0–36.0)
MCV: 102.4 fL — ABNORMAL HIGH (ref 80.0–100.0)
Monocytes Absolute: 1 K/uL (ref 0.1–1.0)
Monocytes Relative: 10 %
Neutro Abs: 7.4 K/uL (ref 1.7–7.7)
Neutrophils Relative %: 72 %
Platelets: 296 K/uL (ref 150–400)
RBC: 2.47 MIL/uL — ABNORMAL LOW (ref 4.22–5.81)
RDW: 18.6 % — ABNORMAL HIGH (ref 11.5–15.5)
WBC: 10.2 K/uL (ref 4.0–10.5)
nRBC: 0 % (ref 0.0–0.2)

## 2020-05-21 LAB — GLUCOSE, CAPILLARY
Glucose-Capillary: 126 mg/dL — ABNORMAL HIGH (ref 70–99)
Glucose-Capillary: 130 mg/dL — ABNORMAL HIGH (ref 70–99)
Glucose-Capillary: 92 mg/dL (ref 70–99)
Glucose-Capillary: 95 mg/dL (ref 70–99)
Glucose-Capillary: 95 mg/dL (ref 70–99)
Glucose-Capillary: 97 mg/dL (ref 70–99)

## 2020-05-21 MED ORDER — MIDAZOLAM HCL 2 MG/2ML IJ SOLN
INTRAMUSCULAR | Status: AC | PRN
  Administered 2020-05-21 (×2): .25 mg via INTRAVENOUS

## 2020-05-21 MED ORDER — FREE WATER
200.0000 mL | Freq: Four times a day (QID) | Status: DC
  Administered 2020-05-21 – 2020-05-27 (×23): 200 mL

## 2020-05-21 MED ORDER — IOHEXOL 300 MG/ML  SOLN
50.0000 mL | Freq: Once | INTRAMUSCULAR | Status: AC | PRN
  Administered 2020-05-21: 15 mL

## 2020-05-21 MED ORDER — CEFAZOLIN SODIUM-DEXTROSE 2-4 GM/100ML-% IV SOLN
2.0000 g | INTRAVENOUS | Status: AC
  Administered 2020-05-21: 2 g via INTRAVENOUS
  Filled 2020-05-21: qty 100

## 2020-05-21 MED ORDER — LIDOCAINE HCL (PF) 1 % IJ SOLN
INTRAMUSCULAR | Status: AC | PRN
  Administered 2020-05-21: 10 mL

## 2020-05-21 MED ORDER — FENTANYL CITRATE (PF) 100 MCG/2ML IJ SOLN
INTRAMUSCULAR | Status: AC | PRN
Start: 2020-05-21 — End: 2020-05-21
  Administered 2020-05-21 (×2): 12.5 ug via INTRAVENOUS

## 2020-05-21 MED ORDER — MIDAZOLAM HCL 2 MG/2ML IJ SOLN
INTRAMUSCULAR | Status: AC
  Filled 2020-05-21: qty 2

## 2020-05-21 MED ORDER — GLUCAGON HCL RDNA (DIAGNOSTIC) 1 MG IJ SOLR
INTRAMUSCULAR | Status: AC
  Filled 2020-05-21: qty 1

## 2020-05-21 MED ORDER — FENTANYL CITRATE (PF) 100 MCG/2ML IJ SOLN
INTRAMUSCULAR | Status: AC
  Filled 2020-05-21: qty 2

## 2020-05-21 MED ORDER — LIDOCAINE HCL (PF) 1 % IJ SOLN
INTRAMUSCULAR | Status: AC
  Filled 2020-05-21: qty 30

## 2020-05-21 NOTE — Progress Notes (Signed)
PROGRESS NOTE    Alan Everett  ZSW:[id number] DOB: Jan 05, 1956 DOA: 05/05/2020 PCP: Patient, No Pcp Per   Brief Narrative:  HPI per Dr. Kerney Elbe on 05/05/20 Alan Everett is an 65 y.o. male with a PMHx of alcohol use and tobacco abuse who presented to the ED via EMS as a Code Stroke for acute onset of right sided weakness. he also had sensory loss and weakness involving his RUE and RLE. 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 Neurosurgery was consulted for EVD consideration which was then deferred.  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  Transferred under [place] on 05/09/2020.  On 05/08/2020, patient spiked fever and he was started on broad-spectrum antibiotics/IV Zosyn empirically.  Despite of antibiotics, patient's white blood cells as well as fever continue to get worse with a T-max of 103.2 at around noon on 05/11/2020.  Previous hospitalist Dr. Shelton Silvas had discussed case with ID who recommended scanning his chest and abdomen and continuing antibiotics.  CT abdomen chest and pelvis shows bibasilar infiltrates suspecting possible pneumonia but no other pathology. 2D Echo EF 60-65%. UDS positive for cocaine  Assessment & Plan:   Active Problems:   ICH (intracerebral hemorrhage) (HCC)   Polysubstance abuse (HCC)   Dysphagia, post-stroke   Hyperglycemia   Hypokalemia   Hypomagnesemia   Fever   SOB (shortness of breath)   Leukocytosis   Aspiration pneumonia of both lower lobes ([place])   Palliative care by specialist   Goals of care, counseling/discussion   DNR (do not resuscitate) discussion    Acute Hemorrhagic Stroke/cerebral edema - [name] ICH w/ IVH d/t severe hypertension in setting of alcohol and cocaine use - Code Stroke CT head 10/6 showed L thalamocapsular IPH 7cc w/ minimal edema, mild IVH. Small vessel disease. Atrophy.  - CT head 10/6 2042 enlarging L  thalamocapsular IPH w/ increasing IVH from L ventricle into 3rd and 4th ventricles. - CT head 10/7 0510 stable L thalamic IPH w/ IVH w/ clearing of blood in 4th ventricle.  - CTA head and neck 10/8 unremarkable angio, slight increase in midline shift and ventriculomegaly - MRI not able to perform due to no family contact to clear for metal - Repeat CT head 10/9 suboptimal but stable - 2D Echo EF 60-65% - UDS positive for cocaine - No antithrombotic prior to admission, now on No antithrombotic given IPH - Therapy recommendations:  CIR vs SNF PENDING admission - Repeat CT head on 05/12/2020 essentially showed stable left thalamic hemorrhage and intraventricular extension since 05/08/2020.  Regional edema, mild regional mass-effect and mild lateral ventriculomegaly and trace SAH.  Neurology signed off on 05/14/2020.  Hypertensive Emergency - Per EMS, SBP > 200  - Home meds:  none - Weaned off off Cleviprex. - Now On Amlodipine 10 and lisinopril 20 bid and as needed IV hydralazine.  Blood pressure mostly controlled with intermittent tachycardia and thus Lopressor was added which I will continue. - SBP goal < 160 - Long-term BP goal normotensive;  Hyperlipidemia - Home meds:  None - LDL 80, goal < 70 - No statin in setting of IPH   Mildly elevated AST/ALT/Alk phos/Hypernatremia - Likely in the setting of dehydration - Rule out secondary causes, ROS limited - abdominal exam benign    Component Value Date/Time   PROT 7.9 05/21/2020 0148   ALBUMIN 2.4 (L) 05/21/2020 0148   AST 146 (H) 05/21/2020  0148   ALT 166 (H) 05/21/2020 0148   ALKPHOS 212 (H) 05/21/2020 0148   BILITOT 0.5 05/21/2020 0148   BILIDIR 0.2 05/09/2020 0827   IBILI 0.3 05/09/2020 0827    Dysphagia - Secondary to stroke - C/W NPO; SLP on board.  Patient on tube feedings at 65 mL. cortrak in place. IR consulted for PEG tube placement given very poor progress with SLP.   Tobacco Abuse -Patient is a current smoker and  will need smoking cessation counseling given once he is more awake  Cocaine abuse - UDS positive for cocaine - Avoid beta-blocker  Hypophosphatemia - Resolved  Hypokalemia Resolved.  Normocytic Anemia, unclear etiology -Patient's Hgb/Hct trending down from 12.0/38.3 -> 11.9/37.9 -> 10.7/34.1 and is trending down to 9.5/30.6 -> 8.7/28.4> 6.8/22.1 received 2 units of PRBC on 05/12/2020> 10.4/32.5. -He is having black stools so iron supplementation was stopped.  FOBT positive.  -Gastroenterology were consulted and recently had an EGD and colonoscopy over the last 2 months.  Dr. Hilarie Fredrickson recommends next up in evaluating would be a video capsule endoscopy but it cannot be formed due to his recent CVA and dysphagia and since his inability swallowed it would have to be done endoscopically and placed an EGD.  Currently endoscopic procedures are high risk with his recent acute CVA and anything small bowel pathology would require a deep enteroscopy or may not be reachable via endoscopically and likely the pathology would be intestinal angiectasia's.  Dr. Elmo Putt recommends continue to monitor carefully. 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  If recurrent transfusion needed, will call GI back.  Sepsis in the setting of Aspiration Pneumonia, likely POA.  -Likely in the setting of aspiration pneumonia. -CT chest shows bibasilar pneumonia.  CT abdomen pelvis negative.  Culture negative so far.  Last temperature 100.9 at 11 AM on 05/12/2020.  Afebrile since then.  ID thinks that his fever is coming from [place].  However they added Flagyl to his current regime of Rocephin and Zithromax on 05/13/2020 and all antibiotics were discontinued on 05/15/2020 per ID recommendations.  Leukocytosis improving slowly. fortunately, he has remained afebrile since 05/12/2020.  GERD -C/w Pantorpazole  40 mg per Tube Daily qHS and will increase to twice daily   Hypernatremia/hyperchloremia -Mild hyperchloremia but hyponatremia resolved.  Thrombocytopenia Resolved.  Hyperglycemia  Slightly hyperglycemic.  Will increase Lantus to 15 units and continue SSI.  DVT prophylaxis: SCDs Code Status: FULL CODE Family Communication: Girlfriend at bedside, lengthy discussion about need for PEG tube and transition to physical therapy/rehab in the next few days, she is agreeable. Disposition Plan: Mental status stable, continues to fail speech eval, planned for PEG tube placement in the next 24 hours, once PEG tube is established patient will be medically stable for discharge to SNF per family wishes.  Status is: Inpatient  Remains inpatient appropriate because:Altered mental status, Unsafe d/c plan, IV treatments appropriate due to intensity of illness or inability to take PO and Inpatient level of care appropriate due to severity of illness   Dispo: The patient is from: Home              Anticipated d/c is to: CIR vs. SNF              Anticipated d/c date is: 1 day              Patient  currently is currently medically stable for discharge pending PEG tube placement for definitive access for feeding and medication administration.  Consultants:   Neurology  Gastroenterology  Case was discussed with infectious diseases Dr. Tommy Medal   Antimicrobials:  Anti-infectives (From admission, onward)   Start     Dose/Rate Route Frequency Ordered Stop   05/14/20 1000  cefTRIAXone (ROCEPHIN) 2 g in sodium chloride 0.9 % 100 mL IVPB        2 g 200 mL/hr over 30 Minutes Intravenous Daily 05/13/20 1454 05/15/20 1255   05/13/20 2200  metroNIDAZOLE (FLAGYL) tablet 500 mg        500 mg Per Tube Every 8 hours 05/13/20 1847 05/15/20 2219   05/13/20 1400  metroNIDAZOLE (FLAGYL) tablet 500 mg  Status:  Discontinued        500 mg Oral Every 8 hours 05/13/20 0935 05/13/20 1847   05/12/20 0900  cefTRIAXone  (ROCEPHIN) 1 g in sodium chloride 0.9 % 100 mL IVPB  Status:  Discontinued        1 g 200 mL/hr over 30 Minutes Intravenous Daily 05/12/20 0817 05/13/20 1454   05/12/20 0900  azithromycin (ZITHROMAX) 500 mg in sodium chloride 0.9 % 250 mL IVPB  Status:  Discontinued        500 mg 250 mL/hr over 60 Minutes Intravenous Daily 05/12/20 0817 05/13/20 0935   05/09/20 0930  Ampicillin-Sulbactam (UNASYN) 3 g in sodium chloride 0.9 % 100 mL IVPB  Status:  Discontinued        3 g 200 mL/hr over 30 Minutes Intravenous Every 8 hours 05/09/20 0837 05/09/20 0839   05/09/20 0930  Ampicillin-Sulbactam (UNASYN) 3 g in sodium chloride 0.9 % 100 mL IVPB  Status:  Discontinued        3 g 200 mL/hr over 30 Minutes Intravenous Every 6 hours 05/09/20 0839 05/12/20 0817        Subjective: Patient seen and examined.  Status quo.  Laying on the left side.  Favors left side.  Does not track at all.  Very dysarthric.  Completely disoriented as he has been for the last several days.  Objective: Vitals:   05/20/20 1639 05/20/20 2044 05/20/20 2317 05/21/20 0412  BP: 127/62 (!) 119/58 (!) 92/58 98/63  Pulse: 99 (!) 116 88 99  Resp: 20 17 19 19   Temp: 98.2 F (36.8 C) 98 F (36.7 C) 98.3 F (36.8 C) 98.4 F (36.9 C)  TempSrc: Axillary Oral Oral Oral  SpO2: 99% 100% 100% 100%  Weight:        Intake/Output Summary (Last 24 hours) at 05/21/2020 0748 Last data filed at 05/20/2020 2221 Gross per 24 hour  Intake 80 ml  Output 275 ml  Net -195 ml   Filed Weights   05/17/20 0333 05/19/20 0357 05/20/20 0539  Weight: 65.5 kg 65.8 kg 63.3 kg   Examination: Physical Exam:  General exam: Appears calm and comfortable and disoriented Respiratory system: Clear to auscultation. Respiratory effort normal. Cardiovascular system: S1 & S2 heard, RRR. No JVD, murmurs, rubs, gallops or clicks. No pedal edema. Gastrointestinal system: Abdomen is nondistended, soft and nontender. No organomegaly or masses felt. Normal  bowel sounds heard. Central nervous system: Lethargic unable to assess orientation.  Not following any commands in any meaningful way.  Data Reviewed: I have personally reviewed following labs and imaging studies  CBC: Recent Labs  Lab 05/17/20 0156 05/18/20 0309 05/19/20 0459 05/20/20 0248 05/21/20 0148  WBC 22.4* 21.5* 19.1* 15.7* 10.2  NEUTROABS 16.3* 15.6* 14.9* 12.1* 7.4  HGB 7.4* 7.9* 7.3* 7.3* 7.3*  HCT 24.5* 26.6* 24.7* 24.8* 25.3*  MCV 97.2 97.1 100.0 100.4* 102.4*  PLT 313 304 309 307 564   Basic Metabolic Panel: Recent Labs  Lab 05/15/20 0354 05/16/20 0242 05/18/20 0309 05/20/20 0758 05/21/20 0148  NA 153* 151* 148* 150* 153*  K 4.2 4.1 4.1 4.2 4.0  CL 122* 120* 118* 121* 120*  CO2 20* 21* 23 19* 25  GLUCOSE 171* 151* 147* 96 93  BUN 35* 33* 30* 42* 52*  CREATININE 1.25* 1.16 1.01 1.28* 1.33*  CALCIUM 9.1 8.9 9.1 9.2 9.3  MG  --   --  2.5*  --   --    GFR: Estimated Creatinine Clearance: 50.2 mL/min (A) (by C-G formula based on SCr of 1.33 mg/dL (H)). Liver Function Tests: Recent Labs  Lab 05/16/20 0242 05/18/20 0309 05/20/20 0758 05/21/20 0148  AST 106* 115* 101* 146*  ALT 116* 124* 124* 166*  ALKPHOS 113 149* 158* 212*  BILITOT 0.7 0.8 0.6 0.5  PROT 7.2 7.3 7.5 7.9  ALBUMIN 2.2* 2.2* 2.2* 2.4*   No results for input(s): LIPASE, AMYLASE in the last 168 hours. No results for input(s): AMMONIA in the last 168 hours. Coagulation Profile: Recent Labs  Lab 05/20/20 0248  INR 1.1   Cardiac Enzymes: No results for input(s): CKTOTAL, CKMB, CKMBINDEX, TROPONINI in the last 168 hours. BNP (last 3 results) No results for input(s): PROBNP in the last 8760 hours. HbA1C: No results for input(s): HGBA1C in the last 72 hours. CBG: Recent Labs  Lab 05/20/20 1137 05/20/20 1644 05/20/20 2040 05/20/20 2316 05/21/20 0413  GLUCAP 99 90 133* 121* 92   Lipid Profile: No results for input(s): CHOL, HDL, LDLCALC, TRIG, CHOLHDL, LDLDIRECT in the last 72  hours. Thyroid Function Tests: No results for input(s): TSH, T4TOTAL, FREET4, T3FREE, THYROIDAB in the last 72 hours. Anemia Panel: No results for input(s): VITAMINB12, FOLATE, FERRITIN, TIBC, IRON, RETICCTPCT in the last 72 hours. Sepsis Labs: No results for input(s): PROCALCITON, LATICACIDVEN in the last 168 hours.  Recent Results (from the past 240 hour(s))  Surgical pcr screen     Status: None   Collection Time: 05/20/20  3:00 AM   Specimen: Nasal Mucosa; Nasal Swab  Result Value Ref Range Status   MRSA, PCR NEGATIVE NEGATIVE Final   Staphylococcus aureus NEGATIVE NEGATIVE Final    Comment: (NOTE) The Xpert SA Assay (FDA approved for NASAL specimens in patients 55 years of age and older), is one component of a comprehensive surveillance program. It is not intended to diagnose infection nor to guide or monitor treatment. Performed at [facility], [place] [address] [place], [place] [address]      RN Pressure Injury Documentation:     Estimated body mass index is 22.52 kg/m as calculated from the following:   Height as of 04/09/20: 5' 6"  (1.676 m).   Weight as of this encounter: 63.3 kg.  Malnutrition Type:  Nutrition Problem: Inadequate oral intake Etiology: dysphagia, acute illness   Malnutrition Characteristics:  Signs/Symptoms: NPO status   Nutrition Interventions:  Interventions: Tube feeding     Radiology Studies: DG Abd Portable 1V  Result Date: 05/20/2020 CLINICAL DATA:  History of dysphagia. EXAM: PORTABLE ABDOMEN - 1 VIEW COMPARISON:  CT report 05/11/2020. FINDINGS: Feeding tube noted with tip over the distal stomach/proximal duodenum. Previously administered barium noted throughout the colon and rectum. Mild residual noted in the stomach. Lumbar scoliosis  concave left. Aortoiliac atherosclerotic vascular calcification. IMPRESSION: Feeding tube noted with tip over the distal stomach/proximal duodenum. Previously administered barium noted throughout  the colon and rectum. Mild residual noted in the stomach. Electronically Signed   By: Marcello Moores  Register   On: 05/20/2020 06:35   Scheduled Meds: . amLODipine  10 mg Per Tube Daily  . chlorhexidine  15 mL Mouth Rinse BID  . free water  150 mL Per Tube Q6H  . insulin aspart  0-9 Units Subcutaneous Q4H  . insulin glargine  15 Units Subcutaneous Daily  . lisinopril  20 mg Per Tube BID  . mouth rinse  15 mL Mouth Rinse q12n4p  . metoprolol tartrate  25 mg Per Tube BID  . pantoprazole sodium  40 mg Per Tube BID  . senna-docusate  1 tablet Per Tube BID  . sodium chloride flush  10-40 mL Intracatheter Q12H   Continuous Infusions: . feeding supplement (OSMOLITE 1.2 CAL) 1,000 mL (05/20/20 1838)    LOS: 16 days   Little Ishikawa, DO [facility] PAGER on secure chat  If 7PM-7AM, please contact night-coverage [email or web address]

## 2020-05-21 NOTE — Progress Notes (Signed)
Nutrition Follow-up  DOCUMENTATION CODES:   Severe malnutrition in context of social or environmental circumstances  INTERVENTION:  Once PEG placed and ready for use, continue: Osmolite 1.2 at 65 ml/hr Flush with 217m free water Q6H Provides 87 g of protein, 1872 kcals and 1264 mL of free water (20643mtotal free water with flushes) Meets 100% estimated calorie and protein needs  NUTRITION DIAGNOSIS:   Severe Malnutrition related to social / environmental circumstances as evidenced by severe fat depletion, severe muscle depletion.  ongoing  GOAL:   Patient will meet greater than or equal to 90% of their needs  Met with TF  MONITOR:   TF tolerance, Diet advancement, Weight trends, Skin  REASON FOR ASSESSMENT:   Consult, New TF Enteral/tube feeding initiation and management  ASSESSMENT:   [age]o male presents with acute onset of right sided weakness and mixed recept and expressive aphasia and admitted with acute left thalamocapsular ICH, severe HTN. PMH includes EtOH and tobacco abuse  10/06 Admitted 10/08 Cortrak placed (gastric) 10/13 repeat CT head showed stable L thalamic hemorrhage and intraventricular extension since 05/08/2020. Regional edema, mild regional mass-effect and mild lateral ventriculomegaly and trace SAH. 10/19 failed MBS  Pt provided no responses to RD questions. Pt to receive PEG and then d/c to SNF. Pt's current TF is infusing through Cortrak, orders are as follows: Osmolite 1.2 at 65 ml/hr with 15058mree water Q6H which provides 87 g of protein, 1872 kcals and 1264 mL of free water (1864m74mtal free water with flushes). This should be continued once PEG placed and ready for use. Will increase free water flushes due to hypernatremia.  Labs: Na 153 (H), CBGs 92-97-95 Medications: ss novolog, lantus 15 units daily, protonix, senokot-s  NUTRITION - FOCUSED PHYSICAL EXAM:    Most Recent Value  Orbital Region Severe depletion  Upper Arm Region  Severe depletion  Thoracic and Lumbar Region Moderate depletion  Buccal Region Severe depletion  Temple Region Severe depletion  Clavicle Bone Region Moderate depletion  Clavicle and Acromion Bone Region Moderate depletion  Scapular Bone Region Moderate depletion  Dorsal Hand Moderate depletion  Patellar Region Severe depletion  Anterior Thigh Region Severe depletion  Posterior Calf Region Severe depletion  Edema (RD Assessment) None  Hair Reviewed  Eyes Reviewed  Mouth Reviewed  Skin Reviewed  Nails Reviewed       Diet Order:   Diet Order            Diet NPO time specified  Diet effective now                 EDUCATION NEEDS:   Not appropriate for education at this time  Skin:  Skin Assessment: Skin Integrity Issues: Skin Integrity Issues:: Other (Comment) Other: puncture abdomen  Last BM:  10/21  Height:   Ht Readings from Last 1 Encounters:  04/09/20 5' 6"  (1.676 m)    Weight:   Wt Readings from Last 1 Encounters:  05/20/20 63.3 kg    BMI:  Body mass index is 22.52 kg/m.  Estimated Nutritional Needs:   Kcal:  1780-1980 kcals  Protein:  85-95 g  Fluid:  >/= 1.8 L    AmanLarkin Ina, RD, LDN RD pager number and weekend/on-call pager number located in [place]

## 2020-05-21 NOTE — Procedures (Signed)
Interventional Radiology Procedure Note  Procedure: Gastrostomy tube placement  Complications: None  Estimated Blood Loss: < 10 mL  Findings: 20 Fr bumper retention gastrostomy tube placed with tip in body of stomach. OK to use in 24 hours.  Mykel Mohl T. Philopateer Strine, M.D Pager:  [phone]   

## 2020-05-22 LAB — CBC WITH DIFFERENTIAL/PLATELET
Abs Immature Granulocytes: 0.14 10*3/uL — ABNORMAL HIGH (ref 0.00–0.07)
Basophils Absolute: 0.1 10*3/uL (ref 0.0–0.1)
Basophils Relative: 0 %
Eosinophils Absolute: 0.3 10*3/uL (ref 0.0–0.5)
Eosinophils Relative: 2 %
HCT: 26.3 % — ABNORMAL LOW (ref 39.0–52.0)
Hemoglobin: 7.6 g/dL — ABNORMAL LOW (ref 13.0–17.0)
Immature Granulocytes: 1 %
Lymphocytes Relative: 7 %
Lymphs Abs: 0.9 10*3/uL (ref 0.7–4.0)
MCH: 29.3 pg (ref 26.0–34.0)
MCHC: 28.9 g/dL — ABNORMAL LOW (ref 30.0–36.0)
MCV: 101.5 fL — ABNORMAL HIGH (ref 80.0–100.0)
Monocytes Absolute: 1.2 10*3/uL — ABNORMAL HIGH (ref 0.1–1.0)
Monocytes Relative: 9 %
Neutro Abs: 10.8 10*3/uL — ABNORMAL HIGH (ref 1.7–7.7)
Neutrophils Relative %: 81 %
Platelets: 319 10*3/uL (ref 150–400)
RBC: 2.59 MIL/uL — ABNORMAL LOW (ref 4.22–5.81)
RDW: 18.9 % — ABNORMAL HIGH (ref 11.5–15.5)
WBC: 13.5 10*3/uL — ABNORMAL HIGH (ref 4.0–10.5)
nRBC: 0 % (ref 0.0–0.2)

## 2020-05-22 LAB — GLUCOSE, CAPILLARY
Glucose-Capillary: 113 mg/dL — ABNORMAL HIGH (ref 70–99)
Glucose-Capillary: 116 mg/dL — ABNORMAL HIGH (ref 70–99)
Glucose-Capillary: 124 mg/dL — ABNORMAL HIGH (ref 70–99)
Glucose-Capillary: 128 mg/dL — ABNORMAL HIGH (ref 70–99)
Glucose-Capillary: 128 mg/dL — ABNORMAL HIGH (ref 70–99)
Glucose-Capillary: 149 mg/dL — ABNORMAL HIGH (ref 70–99)

## 2020-05-22 LAB — COMPREHENSIVE METABOLIC PANEL WITH GFR
ALT: 129 U/L — ABNORMAL HIGH (ref 0–44)
AST: 91 U/L — ABNORMAL HIGH (ref 15–41)
Albumin: 2.3 g/dL — ABNORMAL LOW (ref 3.5–5.0)
Alkaline Phosphatase: 187 U/L — ABNORMAL HIGH (ref 38–126)
Anion gap: 8 (ref 5–15)
BUN: 67 mg/dL — ABNORMAL HIGH (ref 8–23)
CO2: 23 mmol/L (ref 22–32)
Calcium: 9.3 mg/dL (ref 8.9–10.3)
Chloride: 120 mmol/L — ABNORMAL HIGH (ref 98–111)
Creatinine, Ser: 1.75 mg/dL — ABNORMAL HIGH (ref 0.61–1.24)
GFR, Estimated: 43 mL/min — ABNORMAL LOW
Glucose, Bld: 152 mg/dL — ABNORMAL HIGH (ref 70–99)
Potassium: 4.3 mmol/L (ref 3.5–5.1)
Sodium: 151 mmol/L — ABNORMAL HIGH (ref 135–145)
Total Bilirubin: 0.7 mg/dL (ref 0.3–1.2)
Total Protein: 8 g/dL (ref 6.5–8.1)

## 2020-05-22 NOTE — Progress Notes (Signed)
Patient ID: Alan Everett, male   DOB: 07/25/1956, 64 y.o.   MRN: [id number] Pt's G tube intact, insertion site ok, tender to palpation as expected but no bleeding or drainage in area; VSS; afebrile; hgb 7.6(7.3)WBC 13.5, creat 1.75; ok to use tube prn

## 2020-05-22 NOTE — Progress Notes (Signed)
PROGRESS NOTE    Alan Everett  BDZ:[id number] DOB: Sep 07, 1955 DOA: 05/05/2020 PCP: Patient, No Pcp Per   Brief Narrative:  HPI per Dr. Kerney Elbe on 05/05/20 Alan Everett is an 64 y.o. male with a PMHx of alcohol use and tobacco abuse who presented to the ED via EMS as a Code Stroke for acute onset of right sided weakness. he also had sensory loss and weakness involving his RUE and RLE. 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 Neurosurgery was consulted for EVD consideration which was then deferred.  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  Transferred under [place] on 05/09/2020.  On 05/08/2020, patient spiked fever and he was started on broad-spectrum antibiotics/IV Zosyn empirically.  Despite of antibiotics, patient's white blood cells as well as fever continue to get worse with a T-max of 103.2 at around noon on 05/11/2020.  Previous hospitalist Dr. Shelton Silvas had discussed case with ID who recommended scanning his chest and abdomen and continuing antibiotics.  CT abdomen chest and pelvis shows bibasilar infiltrates suspecting possible pneumonia but no other pathology. 2D Echo EF 60-65%. UDS positive for cocaine  Assessment & Plan:   Active Problems:   ICH (intracerebral hemorrhage) (HCC)   Polysubstance abuse (HCC)   Dysphagia, post-stroke   Hyperglycemia   Hypokalemia   Hypomagnesemia   Fever   SOB (shortness of breath)   Leukocytosis   Aspiration pneumonia of both lower lobes ([place])   Palliative care by specialist   Goals of care, counseling/discussion   DNR (do not resuscitate) discussion   Protein-calorie malnutrition, severe   Acute Hemorrhagic Stroke/cerebral edema - [name] ICH w/ IVH d/t severe hypertension in setting of alcohol and cocaine use - Code Stroke CT head 10/6 showed L thalamocapsular IPH 7cc w/ minimal edema, mild IVH. Small vessel disease. Atrophy.   - CT head 10/6 2042 enlarging L thalamocapsular IPH w/ increasing IVH from L ventricle into 3rd and 4th ventricles. - CT head 10/7 0510 stable L thalamic IPH w/ IVH w/ clearing of blood in 4th ventricle.  - CTA head and neck 10/8 unremarkable angio, slight increase in midline shift and ventriculomegaly - MRI not able to perform due to no family contact to clear for metal - Repeat CT head 10/9 suboptimal but stable - 2D Echo EF 60-65% - UDS positive for cocaine - No antithrombotic prior to admission, now on No antithrombotic given IPH - Therapy recommendations:  CIR vs SNF PENDING admission - Repeat CT head on 05/12/2020 essentially showed stable left thalamic hemorrhage and intraventricular extension since 05/08/2020.  Regional edema, mild regional mass-effect and mild lateral ventriculomegaly and trace SAH.  Neurology signed off on 05/14/2020.  Hypertensive Emergency - Per EMS, SBP > 200  - Home meds:  none - Weaned off off Cleviprex. - Now On Amlodipine 10 and lisinopril 20 bid and as needed IV hydralazine.  Blood pressure mostly controlled with intermittent tachycardia and thus Lopressor was added which I will continue. - SBP goal < 160 - Long-term BP goal normotensive;  Hyperlipidemia - Home meds:  None - LDL 80, goal < 70 - No statin in setting of IPH  Mildly elevated AST/ALT/Alk phos/Hypernatremia, resolving - Likely in the setting of dehydration/NPO status peri-procedure for nearly 48h - Appropriately downtrending with resumption of free water flushes. - Rule out secondary causes, ROS limited - abdominal exam benign    Component Value Date/Time  PROT 8.0 05/22/2020 0226   ALBUMIN 2.3 (L) 05/22/2020 0226   AST 91 (H) 05/22/2020 0226   ALT 129 (H) 05/22/2020 0226   ALKPHOS 187 (H) 05/22/2020 0226   BILITOT 0.7 05/22/2020 0226   BILIDIR 0.2 05/09/2020 0827   IBILI 0.3 05/09/2020 0827    Dysphagia - Secondary to stroke - C/W NPO; SLP on board.  PEG tube placed  05/21/2020, discontinue NG tube today and transition to PEG tube use for free water, tube feeds and medication administration.   Tobacco Abuse - Patient is a current smoker and will need smoking cessation counseling given once he is more awake  Cocaine abuse - UDS positive for cocaine - Avoid beta-blocker  Hypophosphatemia - Resolved  Hypokalemia - Resolved  Normocytic Anemia, unclear etiology - Patient's Hgb/Hct trending down from 12.0/38.3 -> 11.9/37.9 -> 10.7/34.1 and is trending down to 9.5/30.6 -> 8.7/28.4> 6.8/22.1 received 2 units of PRBC on 05/12/2020> 10.4/32.5. - He was previously having black stools so iron supplementation was stopped.  FOBT positive -GI was consulted - Dr. Hilarie Fredrickson recommends next up in evaluating would be a video capsule endoscopy but it cannot be formed due to his recent CVA and dysphagia and since his inability swallowed it would have to be done endoscopically and placed via EGD which is certainly high risk procedure at this time.  Anemia stabilized at this time, no further indication for intervention or treatment.  Sepsis in the setting of Aspiration Pneumonia, likely POA.  - Likely in the setting of aspiration pneumonia. - CT chest shows bibasilar pneumonia.  CT abdomen pelvis negative.  Culture negative so far.  Last temperature 100.9 at 11 AM on 05/12/2020.  Afebrile since then.  ID thinks that his fever is coming from [place].  However they added Flagyl to his current regime of Rocephin and Zithromax on 05/13/2020 and all antibiotics were discontinued on 05/15/2020 per ID recommendations.  Leukocytosis improving slowly. fortunately, he has remained afebrile since 05/12/2020.  GERD -C/w Pantorpazole 40 mg per Tube Daily qHS and will increase to twice daily   Hypernatremia/hyperchloremia -Mild hyperchloremia but hyponatremia resolved.  Thrombocytopenia Resolved.  Hyperglycemia  Slightly hyperglycemic. Continue increased dose of Lantus to 15 units and  continue SSI.  DVT prophylaxis: SCDs Code Status: FULL CODE Family Communication: Girlfriend at bedside, lengthy discussion about need for PEG tube and transition to physical therapy/rehab in the next few days, she is agreeable. Disposition Plan: Mental status stable, continues to fail speech eval, planned for PEG tube placement in the next 24 hours, once PEG tube is established patient will be medically stable for discharge to SNF per family wishes.  Status is: Inpatient  Remains inpatient appropriate because:Altered mental status, Unsafe d/c plan, IV treatments appropriate due to intensity of illness or inability to take PO and Inpatient level of care appropriate due to severity of illness   Dispo: The patient is from: Home              Anticipated d/c is to: CIR vs. SNF              Anticipated d/c date is: 1 day              Patient currently is currently medically stable for discharge pending PEG tube placement for definitive access for feeding and medication administration.  Consultants:   Neurology  Gastroenterology  Case was discussed with infectious diseases Dr. Tommy Medal  Antimicrobials:  Anti-infectives (From admission, onward)   Start  Dose/Rate Route Frequency Ordered Stop   05/21/20 1515  ceFAZolin (ANCEF) IVPB 2g/100 mL premix        2 g 200 mL/hr over 30 Minutes Intravenous To Radiology 05/21/20 1429 05/21/20 2100   05/14/20 1000  cefTRIAXone (ROCEPHIN) 2 g in sodium chloride 0.9 % 100 mL IVPB        2 g 200 mL/hr over 30 Minutes Intravenous Daily 05/13/20 1454 05/15/20 1255   05/13/20 2200  metroNIDAZOLE (FLAGYL) tablet 500 mg        500 mg Per Tube Every 8 hours 05/13/20 1847 05/15/20 2219   05/13/20 1400  metroNIDAZOLE (FLAGYL) tablet 500 mg  Status:  Discontinued        500 mg Oral Every 8 hours 05/13/20 0935 05/13/20 1847   05/12/20 0900  cefTRIAXone (ROCEPHIN) 1 g in sodium chloride 0.9 % 100 mL IVPB  Status:  Discontinued        1 g 200 mL/hr over 30  Minutes Intravenous Daily 05/12/20 0817 05/13/20 1454   05/12/20 0900  azithromycin (ZITHROMAX) 500 mg in sodium chloride 0.9 % 250 mL IVPB  Status:  Discontinued        500 mg 250 mL/hr over 60 Minutes Intravenous Daily 05/12/20 0817 05/13/20 0935   05/09/20 0930  Ampicillin-Sulbactam (UNASYN) 3 g in sodium chloride 0.9 % 100 mL IVPB  Status:  Discontinued        3 g 200 mL/hr over 30 Minutes Intravenous Every 8 hours 05/09/20 0837 05/09/20 0839   05/09/20 0930  Ampicillin-Sulbactam (UNASYN) 3 g in sodium chloride 0.9 % 100 mL IVPB  Status:  Discontinued        3 g 200 mL/hr over 30 Minutes Intravenous Every 6 hours 05/09/20 0839 05/12/20 0817        Subjective: No acute issues or events overnight tolerating PEG tube placement quite well, continues to follow simple commands/yes/no questions by shaking his head yes or no.  Objective: Vitals:   05/21/20 2000 05/21/20 2330 05/21/20 2348 05/22/20 0338  BP: (!) 101/53 (!) 96/59 (!) 168/92 (!) 104/53  Pulse: (!) 102 93 73 96  Resp: 18 16 14 18   Temp: 98.6 F (37 C) 98.3 F (36.8 C) (!) 97.4 F (36.3 C) 98.8 F (37.1 C)  TempSrc: Oral Oral Oral Oral  SpO2: 100% 100% 97% 100%  Weight:        Intake/Output Summary (Last 24 hours) at 05/22/2020 0748 Last data filed at 05/22/2020 0400 Gross per 24 hour  Intake 1595 ml  Output 900 ml  Net 695 ml   Filed Weights   05/17/20 0333 05/19/20 0357 05/20/20 0539  Weight: 65.5 kg 65.8 kg 63.3 kg   Examination: Physical Exam:  General exam: Appears calm and comfortable and disoriented Respiratory system: Clear to auscultation. Respiratory effort normal. Cardiovascular system: S1 & S2 heard, RRR. No JVD, murmurs, rubs, gallops or clicks. No pedal edema. Gastrointestinal system: Abdomen is nondistended, soft and nontender. No organomegaly or masses felt. Normal bowel sounds heard. Central nervous system: Lethargic unable to assess orientation. Not following any commands in any meaningful  way.  Data Reviewed: I have personally reviewed following labs and imaging studies  CBC: Recent Labs  Lab 05/18/20 0309 05/19/20 0459 05/20/20 0248 05/21/20 0148 05/22/20 0226  WBC 21.5* 19.1* 15.7* 10.2 13.5*  NEUTROABS 15.6* 14.9* 12.1* 7.4 10.8*  HGB 7.9* 7.3* 7.3* 7.3* 7.6*  HCT 26.6* 24.7* 24.8* 25.3* 26.3*  MCV 97.1 100.0 100.4* 102.4* 101.5*  PLT 304  309 307 296 638   Basic Metabolic Panel: Recent Labs  Lab 05/16/20 0242 05/18/20 0309 05/20/20 0758 05/21/20 0148 05/22/20 0226  NA 151* 148* 150* 153* 151*  K 4.1 4.1 4.2 4.0 4.3  CL 120* 118* 121* 120* 120*  CO2 21* 23 19* 25 23  GLUCOSE 151* 147* 96 93 152*  BUN 33* 30* 42* 52* 67*  CREATININE 1.16 1.01 1.28* 1.33* 1.75*  CALCIUM 8.9 9.1 9.2 9.3 9.3  MG  --  2.5*  --   --   --    GFR: Estimated Creatinine Clearance: 38.2 mL/min (A) (by C-G formula based on SCr of 1.75 mg/dL (H)). Liver Function Tests: Recent Labs  Lab 05/16/20 0242 05/18/20 0309 05/20/20 0758 05/21/20 0148 05/22/20 0226  AST 106* 115* 101* 146* 91*  ALT 116* 124* 124* 166* 129*  ALKPHOS 113 149* 158* 212* 187*  BILITOT 0.7 0.8 0.6 0.5 0.7  PROT 7.2 7.3 7.5 7.9 8.0  ALBUMIN 2.2* 2.2* 2.2* 2.4* 2.3*   No results for input(s): LIPASE, AMYLASE in the last 168 hours. No results for input(s): AMMONIA in the last 168 hours. Coagulation Profile: Recent Labs  Lab 05/20/20 0248  INR 1.1   Cardiac Enzymes: No results for input(s): CKTOTAL, CKMB, CKMBINDEX, TROPONINI in the last 168 hours. BNP (last 3 results) No results for input(s): PROBNP in the last 8760 hours. HbA1C: No results for input(s): HGBA1C in the last 72 hours. CBG: Recent Labs  Lab 05/21/20 1301 05/21/20 1711 05/21/20 2006 05/21/20 2339 05/22/20 0345  GLUCAP 95 95 126* 130* 128*   Lipid Profile: No results for input(s): CHOL, HDL, LDLCALC, TRIG, CHOLHDL, LDLDIRECT in the last 72 hours. Thyroid Function Tests: No results for input(s): TSH, T4TOTAL, FREET4,  T3FREE, THYROIDAB in the last 72 hours. Anemia Panel: No results for input(s): VITAMINB12, FOLATE, FERRITIN, TIBC, IRON, RETICCTPCT in the last 72 hours. Sepsis Labs: No results for input(s): PROCALCITON, LATICACIDVEN in the last 168 hours.  Recent Results (from the past 240 hour(s))  Surgical pcr screen     Status: None   Collection Time: 05/20/20  3:00 AM   Specimen: Nasal Mucosa; Nasal Swab  Result Value Ref Range Status   MRSA, PCR NEGATIVE NEGATIVE Final   Staphylococcus aureus NEGATIVE NEGATIVE Final    Comment: (NOTE) The Xpert SA Assay (FDA approved for NASAL specimens in patients 39 years of age and older), is one component of a comprehensive surveillance program. It is not intended to diagnose infection nor to guide or monitor treatment. Performed at [facility], [place] [address] [place], [place] [address]      RN Pressure Injury Documentation: Estimated body mass index is 22.52 kg/m as calculated from the following:   Height as of 04/09/20: 5' 6"  (1.676 m).   Weight as of this encounter: 63.3 kg. Malnutrition Type: Nutrition Problem: Severe Malnutrition Etiology: social / environmental circumstances Malnutrition Characteristics: Signs/Symptoms: severe fat depletion, severe muscle depletion Nutrition Interventions: Interventions: Tube feeding  Radiology Studies: IR GASTROSTOMY TUBE MOD SED  Result Date: 05/21/2020 CLINICAL DATA:  Cerebral hemorrhage and need for percutaneous gastrostomy tube for nutrition. EXAM: PERCUTANEOUS GASTROSTOMY TUBE PLACEMENT ANESTHESIA/SEDATION: 0.5 mg IV Versed; 25 mcg IV Fentanyl. Total Moderate Sedation Time 10 minutes. The patient's level of consciousness and physiologic status were continuously monitored during the procedure by Radiology nursing. CONTRAST:  30m OMNIPAQUE IOHEXOL 300 MG/ML  SOLN MEDICATIONS: 2 g IV Ancef. IV antibiotic was administered in an appropriate time interval prior to needle puncture  of the skin.  FLUOROSCOPY TIME:  2 minutes and 36 seconds.  5.0 mGy. PROCEDURE: The procedure, risks, benefits, and alternatives were explained to the patient's niece. Questions regarding the procedure were encouraged and answered. The patient's niece understands and consents to the procedure. Prior to the procedure, barium was administered via a feeding tube in order to opacify the colon. A 5-French catheter was then advanced through the patient's mouth under fluoroscopy into the esophagus and to the level of the stomach. This catheter was used to insufflate the stomach with air under fluoroscopy. The abdominal wall was prepped with chlorhexidine in a sterile fashion, and a sterile drape was applied covering the operative field. A sterile gown and sterile gloves were used for the procedure. Local anesthesia was provided with 1% Lidocaine. A skin incision was made in the upper abdominal wall. Under fluoroscopy, an 18 gauge trocar needle was advanced into the stomach. Contrast injection was performed to confirm intraluminal position of the needle tip. A single T tack was then deployed in the lumen of the stomach. This was brought up to tension at the skin surface. Over a guidewire, a 9-French sheath was advanced into the lumen of the stomach. The wire was left in place as a safety wire. A loop snare device from a percutaneous gastrostomy kit was then advanced into the stomach. A floppy guide wire was advanced through the orogastric catheter under fluoroscopy in the stomach. The loop snare advanced through the percutaneous gastric access was used to snare the guide wire. This allowed withdrawal of the loop snare out of the patient's mouth by retraction of the orogastric catheter and wire. A 20-French bumper retention gastrostomy tube was looped around the snare device. It was then pulled back through the patient's mouth. The retention bumper was brought up to the anterior gastric wall. The T tack suture was cut at the skin. The  exiting gastrostomy tube was cut to appropriate length and a feeding adapter applied. The catheter was injected with contrast material to confirm position and a fluoroscopic spot image saved. The tube was then flushed with saline. A dressing was applied over the gastrostomy exit site. COMPLICATIONS: None. FINDINGS: Initial fluoroscopy demonstrates adequate opacification of the colon by ingested barium in order to prevent colonic injury during the procedure. The stomach distended well with air allowing safe placement of the gastrostomy tube. After placement, the tip of the gastrostomy tube lies in the body of the stomach. IMPRESSION: Percutaneous gastrostomy with placement of a 20-French bumper retention tube in the body of the stomach. This tube can be used for percutaneous feeds beginning in 24 hours after placement. Electronically Signed   By: Aletta Edouard M.D.   On: 05/21/2020 16:12   Scheduled Meds:  amLODipine  10 mg Per Tube Daily   chlorhexidine  15 mL Mouth Rinse BID   free water  200 mL Per Tube Q6H   insulin aspart  0-9 Units Subcutaneous Q4H   insulin glargine  15 Units Subcutaneous Daily   lisinopril  20 mg Per Tube BID   mouth rinse  15 mL Mouth Rinse q12n4p   metoprolol tartrate  25 mg Per Tube BID   pantoprazole sodium  40 mg Per Tube BID   senna-docusate  1 tablet Per Tube BID   sodium chloride flush  10-40 mL Intracatheter Q12H   Continuous Infusions:  feeding supplement (OSMOLITE 1.2 CAL) 1,000 mL (05/22/20 0733)    LOS: 17 days   Little Ishikawa, DO [facility] PAGER on secure chat  If 7PM-7AM, please contact night-coverage [email or web address]

## 2020-05-23 ENCOUNTER — Inpatient Hospital Stay (HOSPITAL_COMMUNITY): Payer: Medicaid Other

## 2020-05-23 LAB — COMPREHENSIVE METABOLIC PANEL
ALT: 124 U/L — ABNORMAL HIGH (ref 0–44)
AST: 99 U/L — ABNORMAL HIGH (ref 15–41)
Albumin: 2.3 g/dL — ABNORMAL LOW (ref 3.5–5.0)
Alkaline Phosphatase: 181 U/L — ABNORMAL HIGH (ref 38–126)
Anion gap: 10 (ref 5–15)
BUN: 84 mg/dL — ABNORMAL HIGH (ref 8–23)
CO2: 21 mmol/L — ABNORMAL LOW (ref 22–32)
Calcium: 9 mg/dL (ref 8.9–10.3)
Chloride: 119 mmol/L — ABNORMAL HIGH (ref 98–111)
Creatinine, Ser: 1.67 mg/dL — ABNORMAL HIGH (ref 0.61–1.24)
GFR, Estimated: 45 mL/min — ABNORMAL LOW (ref >60)
Glucose, Bld: 148 mg/dL — ABNORMAL HIGH (ref 70–99)
Potassium: 4.5 mmol/L (ref 3.5–5.1)
Sodium: 150 mmol/L — ABNORMAL HIGH (ref 135–145)
Total Bilirubin: 0.4 mg/dL (ref 0.3–1.2)
Total Protein: 7.5 g/dL (ref 6.5–8.1)

## 2020-05-23 LAB — CBC WITH DIFFERENTIAL/PLATELET
Abs Immature Granulocytes: 0.11 10*3/uL — ABNORMAL HIGH (ref 0.00–0.07)
Basophils Absolute: 0.1 10*3/uL (ref 0.0–0.1)
Basophils Relative: 1 %
Eosinophils Absolute: 0.2 10*3/uL (ref 0.0–0.5)
Eosinophils Relative: 2 %
HCT: 25.4 % — ABNORMAL LOW (ref 39.0–52.0)
Hemoglobin: 7.2 g/dL — ABNORMAL LOW (ref 13.0–17.0)
Immature Granulocytes: 1 %
Lymphocytes Relative: 8 %
Lymphs Abs: 1 10*3/uL (ref 0.7–4.0)
MCH: 29.9 pg (ref 26.0–34.0)
MCHC: 28.3 g/dL — ABNORMAL LOW (ref 30.0–36.0)
MCV: 105.4 fL — ABNORMAL HIGH (ref 80.0–100.0)
Monocytes Absolute: 1.3 10*3/uL — ABNORMAL HIGH (ref 0.1–1.0)
Monocytes Relative: 11 %
Neutro Abs: 9 10*3/uL — ABNORMAL HIGH (ref 1.7–7.7)
Neutrophils Relative %: 77 %
Platelets: 258 10*3/uL (ref 150–400)
RBC: 2.41 MIL/uL — ABNORMAL LOW (ref 4.22–5.81)
RDW: 18.9 % — ABNORMAL HIGH (ref 11.5–15.5)
WBC: 11.6 10*3/uL — ABNORMAL HIGH (ref 4.0–10.5)
nRBC: 0 % (ref 0.0–0.2)

## 2020-05-23 LAB — GLUCOSE, CAPILLARY
Glucose-Capillary: 111 mg/dL — ABNORMAL HIGH (ref 70–99)
Glucose-Capillary: 112 mg/dL — ABNORMAL HIGH (ref 70–99)
Glucose-Capillary: 115 mg/dL — ABNORMAL HIGH (ref 70–99)
Glucose-Capillary: 119 mg/dL — ABNORMAL HIGH (ref 70–99)
Glucose-Capillary: 123 mg/dL — ABNORMAL HIGH (ref 70–99)
Glucose-Capillary: 126 mg/dL — ABNORMAL HIGH (ref 70–99)

## 2020-05-23 MED ORDER — INFLUENZA VAC SPLIT QUAD 0.5 ML IM SUSY
0.5000 mL | PREFILLED_SYRINGE | INTRAMUSCULAR | Status: AC
  Administered 2020-05-24: 0.5 mL via INTRAMUSCULAR
  Filled 2020-05-23: qty 0.5

## 2020-05-23 NOTE — Progress Notes (Signed)
   05/23/20 1600  Assess: MEWS Score  Temp 99.1 F (37.3 C)  BP (!) 94/53  Pulse Rate (!) 108  Resp 20  Assess: MEWS Score  MEWS Temp 0  MEWS Systolic 1  MEWS Pulse 1  MEWS RR 0  MEWS LOC 0  MEWS Score 2  MEWS Score Color Yellow  Assess: if the MEWS score is Yellow or Red  Were vital signs taken at a resting state? Yes  Focused Assessment No change from prior assessment  Early Detection of Sepsis Score *See Row Information* Low  MEWS guidelines implemented *See Row Information* No, vital signs rechecked  Treat  Pain Scale Faces  Pain Score 0  Take Vital Signs  Increase Vital Sign Frequency  Yellow: Q 2hr X 2 then Q 4hr X 2, if remains yellow, continue Q 4hrs  Escalate  MEWS: Escalate Yellow: discuss with charge nurse/RN and consider discussing with provider and RRT  Notify: Charge Nurse/RN  Name of Charge Nurse/RN Notified Marylene Land  Date Charge Nurse/RN Notified 05/23/20  Time Charge Nurse/RN Notified 1601

## 2020-05-23 NOTE — Progress Notes (Signed)
   05/23/20 1547  Assess: MEWS Score  BP (!) 108/57  Pulse Rate (!) 117  Resp 18  Assess: MEWS Score  MEWS Temp 1  MEWS Systolic 0  MEWS Pulse 2  MEWS RR 0  MEWS LOC 0  MEWS Score 3  MEWS Score Color Yellow  Assess: if the MEWS score is Yellow or Red  Were vital signs taken at a resting state? Yes  Focused Assessment No change from prior assessment  Early Detection of Sepsis Score *See Row Information* Low  MEWS guidelines implemented *See Row Information* No, vital signs rechecked  Take Vital Signs  Increase Vital Sign Frequency  Yellow: Q 2hr X 2 then Q 4hr X 2, if remains yellow, continue Q 4hrs  Escalate  MEWS: Escalate Yellow: discuss with charge nurse/RN and consider discussing with provider and RRT  Notify: Charge Nurse/RN  Name of Charge Nurse/RN Notified Angela  Date Charge Nurse/RN Notified 05/22/20  Time Charge Nurse/RN Notified 1550     05/23/20 1547  Assess: MEWS Score  BP (!) 108/57  Pulse Rate (!) 117  Resp 18  Assess: MEWS Score  MEWS Temp 1  MEWS Systolic 0  MEWS Pulse 2  MEWS RR 0  MEWS LOC 0  MEWS Score 3  MEWS Score Color Yellow  Assess: if the MEWS score is Yellow or Red  Were vital signs taken at a resting state? Yes  Focused Assessment No change from prior assessment  Early Detection of Sepsis Score *See Row Information* Low  MEWS guidelines implemented *See Row Information* No, vital signs rechecked  Take Vital Signs  Increase Vital Sign Frequency  Yellow: Q 2hr X 2 then Q 4hr X 2, if remains yellow, continue Q 4hrs  Escalate  MEWS: Escalate Yellow: discuss with charge nurse/RN and consider discussing with provider and RRT  Notify: Charge Nurse/RN  Name of Charge Nurse/RN Notified Marylene Land  Date Charge Nurse/RN Notified 05/22/20  Time Charge Nurse/RN Notified 1550

## 2020-05-23 NOTE — Plan of Care (Signed)
  Problem: Activity: Goal: Risk for activity intolerance will decrease Outcome: Progressing   

## 2020-05-23 NOTE — Plan of Care (Signed)
  Problem: Clinical Measurements: Goal: Respiratory complications will improve Outcome: Progressing   Problem: Clinical Measurements: Goal: Ability to maintain clinical measurements within normal limits will improve Outcome: Not Progressing Goal: Will remain free from infection Outcome: Progressing Goal: Diagnostic test results will improve Outcome: Progressing Goal: Respiratory complications will improve Outcome: Progressing   Problem: Health Behavior/Discharge Planning: Goal: Ability to manage health-related needs will improve Outcome: Not Progressing

## 2020-05-23 NOTE — Progress Notes (Signed)
   05/23/20 1532  Assess: MEWS Score  Temp (!) 101.1 F (38.4 C) (RN Notified)  BP (!) 117/57  Pulse Rate (!) 117 (RN Notified)  Resp (!) 21 (RN Notified)  SpO2 100 %  Assess: MEWS Score  MEWS Temp 1  MEWS Systolic 0  MEWS Pulse 2  MEWS RR 1  MEWS LOC 0  MEWS Score 4  MEWS Score Color Red  Assess: if the MEWS score is Yellow or Red  Were vital signs taken at a resting state? Yes  Focused Assessment Change from prior assessment (see assessment flowsheet)  Early Detection of Sepsis Score *See Row Information* Low  MEWS guidelines implemented *See Row Information* No, vital signs rechecked  Treat  MEWS Interventions Administered scheduled meds/treatments  Pain Scale Faces  Pain Score 0  Breathing 0  Negative Vocalization 0  Facial Expression 0  Body Language 0  Consolability 0  PAINAD Score 0  Facial Expression 0  Body Movements 0  Muscle Tension 0  Compliance with ventilator (intubated pts.) N/A  Vocalization (extubated pts.) N/A  CPOT Total 0  Take Vital Signs  Increase Vital Sign Frequency  Red: Q 1hr X 4 then Q 4hr X 4, if remains red, continue Q 4hrs  Escalate  MEWS: Escalate Red: discuss with charge nurse/RN and provider, consider discussing with RRT  Notify: Charge Nurse/RN  Name of Charge Nurse/RN Notified Marylene Land  Date Charge Nurse/RN Notified 05/22/20  Time Charge Nurse/RN Notified 1535  Notify: Provider  Provider Name/Title Dr Natale Milch  Date Provider Notified 05/23/20  Time Provider Notified 1550  Notification Type Page  Notification Reason Other (Comment)  Document  Patient Outcome Stabilized after interventions

## 2020-05-23 NOTE — Progress Notes (Signed)
PROGRESS NOTE    Alan Everett  JTT:[id number] DOB: 31-Dec-1955 DOA: 05/05/2020 PCP: Patient, No Pcp Per   Brief Narrative:  HPI per Dr. Kerney Elbe on 05/05/20 Alan Everett is an 64 y.o. male with a PMHx of alcohol use and tobacco abuse who presented to the ED via EMS as a Code Stroke for acute onset of right sided weakness. he also had sensory loss and weakness involving his RUE and RLE. 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 Neurosurgery was consulted for EVD consideration which was then deferred.  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  Transferred under Onset on 05/09/2020.  On 05/08/2020, patient spiked fever and he was started on broad-spectrum antibiotics/IV Zosyn empirically.  Despite of antibiotics, patient's white blood cells as well as fever continue to get worse with a T-max of 103.2 at around noon on 05/11/2020.  Previous hospitalist Dr. Shelton Silvas had discussed case with ID who recommended scanning his chest and abdomen and continuing antibiotics.  CT abdomen chest and pelvis shows bibasilar infiltrates suspecting possible pneumonia but no other pathology. 2D Echo EF 60-65%. UDS positive for cocaine  Assessment & Plan:   Active Problems:   ICH (intracerebral hemorrhage) (HCC)   Polysubstance abuse (HCC)   Dysphagia, post-stroke   Hyperglycemia   Hypokalemia   Hypomagnesemia   Fever   SOB (shortness of breath)   Leukocytosis   Aspiration pneumonia of both lower lobes ([place])   Palliative care by specialist   Goals of care, counseling/discussion   DNR (do not resuscitate) discussion   Protein-calorie malnutrition, severe   Acute Hemorrhagic Stroke/cerebral edema - [name] ICH w/ IVH d/t severe hypertension in setting of alcohol and cocaine use - Code Stroke CT head 10/6 showed L thalamocapsular IPH 7cc w/ minimal edema, mild IVH. Small vessel disease. Atrophy.   - CT head 05/05/20 shows enlarging L thalamocapsular IPH w/ increasing IVH from L ventricle into 3rd and 4th ventricles. - CT head 10/7 0510 stable L thalamic IPH w/ IVH w/ clearing of blood in 4th ventricle.  - CTA head and neck 10/8 unremarkable angio, slight increase in midline shift and ventriculomegaly - MRI unable to be performed - Repeat CT head 10/9 suboptimal but stable - 2D Echo EF 60-65% - UDS positive for cocaine - No antithrombotic prior to admission, now on No antithrombotic given IPH - Therapy recommendations: CIR vs SNF PENDING admission - Repeat CT head on 05/12/2020 essentially showed stable left thalamic hemorrhage and intraventricular extension since 05/08/2020.  Regional edema, mild regional mass-effect and mild lateral ventriculomegaly and trace SAH.  Neurology signed off on 05/14/2020.  Hypertensive Emergency, resolving - Per EMS, SBP > 200  - Home meds:  none - Now On Amlodipine 10, metoprolol, and lisinopril 20 bid and as needed IV hydralazine.  Hyperlipidemia - Home meds:  None - LDL 80, goal < 70 - No statin in setting of IPH  Mildly elevated AST/ALT/Alk phos/Hypernatremia, resolving - Likely in the setting of dehydration/NPO status peri-procedure for nearly 48h - Appropriately downtrending with resumption of free water flushes. - Rule out secondary causes, ROS limited - abdominal exam benign    Component Value Date/Time   PROT 7.5 05/23/2020 0244   ALBUMIN 2.3 (L) 05/23/2020 0244   AST 99 (H) 05/23/2020 0244   ALT 124 (H) 05/23/2020 0244   ALKPHOS 181 (H) 05/23/2020 0244   BILITOT 0.4 05/23/2020 0244  BILIDIR 0.2 05/09/2020 0827   IBILI 0.3 05/09/2020 0827    Dysphagia - Secondary to stroke - C/W NPO; SLP on board.  PEG tube placed 05/21/2020, discontinue NG tube today and transition to PEG tube use for free water, tube feeds and medication administration.   Tobacco Abuse - Patient is a current smoker and will need smoking cessation counseling  given once he is more awake  Cocaine abuse - UDS positive for cocaine - Avoid beta-blocker  Hypophosphatemia - Resolved  Hypokalemia - Resolved  Normocytic Anemia, unclear etiology - Patient's Hgb/Hct trending down from 12.0/38.3 -> 11.9/37.9 -> 10.7/34.1 and is trending down to 9.5/30.6 -> 8.7/28.4> 6.8/22.1 received 2 units of PRBC on 05/12/2020> 10.4/32.5. - He was previously having black stools so iron supplementation was stopped.  FOBT positive -GI was consulted - Dr. Hilarie Fredrickson recommends next up in evaluating would be a video capsule endoscopy but it cannot be formed due to his recent CVA and dysphagia and since his inability swallowed it would have to be done endoscopically and placed via EGD which is certainly high risk procedure at this time.  Anemia stabilized at this time, no further indication for intervention or treatment.  Sepsis in the setting of Aspiration Pneumonia, likely POA, resolved  - Likely in the setting of aspiration pneumonia. - CT chest shows bibasilar pneumonia.  CT abdomen pelvis negative.  Culture negative so far.  Last temperature 100.9 at 11 AM on 05/12/2020.  Afebrile since then.  ID thinks that his fever is coming from [place].  However they added Flagyl to his current regime of Rocephin and Zithromax on 05/13/2020 and all antibiotics were discontinued on 05/15/2020 per ID recommendations.  Leukocytosis improving slowly. fortunately, he has remained afebrile since 05/12/2020.  GERD -C/W Pantorpazole 40 mg per Tube Daily qHS and will increase to twice daily   Hypernatremia/hyperchloremia -Mild hyperchloremia but hyponatremia resolved.  Thrombocytopenia - Resolved.  DVT prophylaxis: SCDs Code Status: FULL CODE Family Communication: Girlfriend previously updated- lengthy discussion about need for PEG tube and transition to physical therapy/rehab in the next few days, she is agreeable. Disposition Plan: Mental status stable, PEG tube placed and functioning -  awaiting insurance approval/bed availability for discharge.  Status is: Inpatient  Remains inpatient appropriate because:Altered mental status, Unsafe d/c plan, IV treatments appropriate due to intensity of illness or inability to take PO and Inpatient level of care appropriate due to severity of illness   Dispo: The patient is from: Home              Anticipated d/c is to: CIR vs. SNF              Anticipated d/c date is: 1 day              Patient currently is currently medically stable for discharge.  Consultants:   Neurology  Gastroenterology  Case was discussed with infectious diseases Dr. Tommy Medal  Antimicrobials:  Anti-infectives (From admission, onward)   Start     Dose/Rate Route Frequency Ordered Stop   05/21/20 1515  ceFAZolin (ANCEF) IVPB 2g/100 mL premix        2 g 200 mL/hr over 30 Minutes Intravenous To Radiology 05/21/20 1429 05/21/20 2100   05/14/20 1000  cefTRIAXone (ROCEPHIN) 2 g in sodium chloride 0.9 % 100 mL IVPB        2 g 200 mL/hr over 30 Minutes Intravenous Daily 05/13/20 1454 05/15/20 1255   05/13/20 2200  metroNIDAZOLE (FLAGYL) tablet 500 mg  500 mg Per Tube Every 8 hours 05/13/20 1847 05/15/20 2219   05/13/20 1400  metroNIDAZOLE (FLAGYL) tablet 500 mg  Status:  Discontinued        500 mg Oral Every 8 hours 05/13/20 0935 05/13/20 1847   05/12/20 0900  cefTRIAXone (ROCEPHIN) 1 g in sodium chloride 0.9 % 100 mL IVPB  Status:  Discontinued        1 g 200 mL/hr over 30 Minutes Intravenous Daily 05/12/20 0817 05/13/20 1454   05/12/20 0900  azithromycin (ZITHROMAX) 500 mg in sodium chloride 0.9 % 250 mL IVPB  Status:  Discontinued        500 mg 250 mL/hr over 60 Minutes Intravenous Daily 05/12/20 0817 05/13/20 0935   05/09/20 0930  Ampicillin-Sulbactam (UNASYN) 3 g in sodium chloride 0.9 % 100 mL IVPB  Status:  Discontinued        3 g 200 mL/hr over 30 Minutes Intravenous Every 8 hours 05/09/20 0837 05/09/20 0839   05/09/20 0930   Ampicillin-Sulbactam (UNASYN) 3 g in sodium chloride 0.9 % 100 mL IVPB  Status:  Discontinued        3 g 200 mL/hr over 30 Minutes Intravenous Every 6 hours 05/09/20 0839 05/12/20 0817        Subjective: No acute issues or events overnight tolerating PEG tube placement quite well, continues to follow simple commands/yes/no questions by shaking his head yes or no.  Objective: Vitals:   05/22/20 2200 05/22/20 2340 05/23/20 0340 05/23/20 0545  BP:  120/70 (!) 102/50   Pulse: (!) 106 84 (!) 102   Resp:  19 20   Temp:  98.5 F (36.9 C) 98.6 F (37 C)   TempSrc:  Axillary Axillary   SpO2:  100% 100%   Weight:    60.7 kg    Intake/Output Summary (Last 24 hours) at 05/23/2020 0738 Last data filed at 05/23/2020 0521 Gross per 24 hour  Intake 1376.75 ml  Output 775 ml  Net 601.75 ml   Filed Weights   05/19/20 0357 05/20/20 0539 05/23/20 0545  Weight: 65.8 kg 63.3 kg 60.7 kg   Examination: Physical Exam:  General exam: Appears calm and comfortable and disoriented Respiratory system: Clear to auscultation. Respiratory effort normal. Cardiovascular system: S1 & S2 heard, RRR. No JVD, murmurs, rubs, gallops or clicks. No pedal edema. Gastrointestinal system: Abdomen is nondistended, soft and nontender. No organomegaly or masses felt. Normal bowel sounds heard. PEG tube bandage clean/dry/intact Central nervous system: Lethargic unable to assess orientation. Not following any commands in any meaningful way.  Data Reviewed: I have personally reviewed following labs and imaging studies  CBC: Recent Labs  Lab 05/19/20 0459 05/20/20 0248 05/21/20 0148 05/22/20 0226 05/23/20 0244  WBC 19.1* 15.7* 10.2 13.5* 11.6*  NEUTROABS 14.9* 12.1* 7.4 10.8* 9.0*  HGB 7.3* 7.3* 7.3* 7.6* 7.2*  HCT 24.7* 24.8* 25.3* 26.3* 25.4*  MCV 100.0 100.4* 102.4* 101.5* 105.4*  PLT 309 307 296 319 277   Basic Metabolic Panel: Recent Labs  Lab 05/18/20 0309 05/20/20 0758 05/21/20 0148  05/22/20 0226 05/23/20 0244  NA 148* 150* 153* 151* 150*  K 4.1 4.2 4.0 4.3 4.5  CL 118* 121* 120* 120* 119*  CO2 23 19* 25 23 21*  GLUCOSE 147* 96 93 152* 148*  BUN 30* 42* 52* 67* 84*  CREATININE 1.01 1.28* 1.33* 1.75* 1.67*  CALCIUM 9.1 9.2 9.3 9.3 9.0  MG 2.5*  --   --   --   --  GFR: Estimated Creatinine Clearance: 38.4 mL/min (A) (by C-G formula based on SCr of 1.67 mg/dL (H)). Liver Function Tests: Recent Labs  Lab 05/18/20 0309 05/20/20 0758 05/21/20 0148 05/22/20 0226 05/23/20 0244  AST 115* 101* 146* 91* 99*  ALT 124* 124* 166* 129* 124*  ALKPHOS 149* 158* 212* 187* 181*  BILITOT 0.8 0.6 0.5 0.7 0.4  PROT 7.3 7.5 7.9 8.0 7.5  ALBUMIN 2.2* 2.2* 2.4* 2.3* 2.3*   No results for input(s): LIPASE, AMYLASE in the last 168 hours. No results for input(s): AMMONIA in the last 168 hours. Coagulation Profile: Recent Labs  Lab 05/20/20 0248  INR 1.1   Cardiac Enzymes: No results for input(s): CKTOTAL, CKMB, CKMBINDEX, TROPONINI in the last 168 hours. BNP (last 3 results) No results for input(s): PROBNP in the last 8760 hours. HbA1C: No results for input(s): HGBA1C in the last 72 hours. CBG: Recent Labs  Lab 05/22/20 1228 05/22/20 1621 05/22/20 2002 05/22/20 2348 05/23/20 0349  GLUCAP 128* 113* 124* 116* 119*   Lipid Profile: No results for input(s): CHOL, HDL, LDLCALC, TRIG, CHOLHDL, LDLDIRECT in the last 72 hours. Thyroid Function Tests: No results for input(s): TSH, T4TOTAL, FREET4, T3FREE, THYROIDAB in the last 72 hours. Anemia Panel: No results for input(s): VITAMINB12, FOLATE, FERRITIN, TIBC, IRON, RETICCTPCT in the last 72 hours. Sepsis Labs: No results for input(s): PROCALCITON, LATICACIDVEN in the last 168 hours.  Recent Results (from the past 240 hour(s))  Surgical pcr screen     Status: None   Collection Time: 05/20/20  3:00 AM   Specimen: Nasal Mucosa; Nasal Swab  Result Value Ref Range Status   MRSA, PCR NEGATIVE NEGATIVE Final    Staphylococcus aureus NEGATIVE NEGATIVE Final    Comment: (NOTE) The Xpert SA Assay (FDA approved for NASAL specimens in patients 57 years of age and older), is one component of a comprehensive surveillance program. It is not intended to diagnose infection nor to guide or monitor treatment. Performed at [facility], [place] [address] [place], [place] [address]      RN Pressure Injury Documentation: Estimated body mass index is 21.6 kg/m as calculated from the following:   Height as of 04/09/20: 5' 6"  (1.676 m).   Weight as of this encounter: 60.7 kg. Malnutrition Type: Nutrition Problem: Severe Malnutrition Etiology: social / environmental circumstances Malnutrition Characteristics: Signs/Symptoms: severe fat depletion, severe muscle depletion Nutrition Interventions: Interventions: Tube feeding  Radiology Studies: IR GASTROSTOMY TUBE MOD SED  Result Date: 05/21/2020 CLINICAL DATA:  Cerebral hemorrhage and need for percutaneous gastrostomy tube for nutrition. EXAM: PERCUTANEOUS GASTROSTOMY TUBE PLACEMENT ANESTHESIA/SEDATION: 0.5 mg IV Versed; 25 mcg IV Fentanyl. Total Moderate Sedation Time 10 minutes. The patient's level of consciousness and physiologic status were continuously monitored during the procedure by Radiology nursing. CONTRAST:  43m OMNIPAQUE IOHEXOL 300 MG/ML  SOLN MEDICATIONS: 2 g IV Ancef. IV antibiotic was administered in an appropriate time interval prior to needle puncture of the skin. FLUOROSCOPY TIME:  2 minutes and 36 seconds.  5.0 mGy. PROCEDURE: The procedure, risks, benefits, and alternatives were explained to the patient's niece. Questions regarding the procedure were encouraged and answered. The patient's niece understands and consents to the procedure. Prior to the procedure, barium was administered via a feeding tube in order to opacify the colon. A 5-French catheter was then advanced through the patient's mouth under fluoroscopy into the esophagus and to  the level of the stomach. This catheter was used to insufflate the stomach with air under fluoroscopy.  The abdominal wall was prepped with chlorhexidine in a sterile fashion, and a sterile drape was applied covering the operative field. A sterile gown and sterile gloves were used for the procedure. Local anesthesia was provided with 1% Lidocaine. A skin incision was made in the upper abdominal wall. Under fluoroscopy, an 18 gauge trocar needle was advanced into the stomach. Contrast injection was performed to confirm intraluminal position of the needle tip. A single T tack was then deployed in the lumen of the stomach. This was brought up to tension at the skin surface. Over a guidewire, a 9-French sheath was advanced into the lumen of the stomach. The wire was left in place as a safety wire. A loop snare device from a percutaneous gastrostomy kit was then advanced into the stomach. A floppy guide wire was advanced through the orogastric catheter under fluoroscopy in the stomach. The loop snare advanced through the percutaneous gastric access was used to snare the guide wire. This allowed withdrawal of the loop snare out of the patient's mouth by retraction of the orogastric catheter and wire. A 20-French bumper retention gastrostomy tube was looped around the snare device. It was then pulled back through the patient's mouth. The retention bumper was brought up to the anterior gastric wall. The T tack suture was cut at the skin. The exiting gastrostomy tube was cut to appropriate length and a feeding adapter applied. The catheter was injected with contrast material to confirm position and a fluoroscopic spot image saved. The tube was then flushed with saline. A dressing was applied over the gastrostomy exit site. COMPLICATIONS: None. FINDINGS: Initial fluoroscopy demonstrates adequate opacification of the colon by ingested barium in order to prevent colonic injury during the procedure. The stomach distended well  with air allowing safe placement of the gastrostomy tube. After placement, the tip of the gastrostomy tube lies in the body of the stomach. IMPRESSION: Percutaneous gastrostomy with placement of a 20-French bumper retention tube in the body of the stomach. This tube can be used for percutaneous feeds beginning in 24 hours after placement. Electronically Signed   By: Aletta Edouard M.D.   On: 05/21/2020 16:12   Scheduled Meds: . amLODipine  10 mg Per Tube Daily  . chlorhexidine  15 mL Mouth Rinse BID  . free water  200 mL Per Tube Q6H  . [START ON 05/24/2020] influenza vac split quadrivalent PF  0.5 mL Intramuscular Tomorrow-1000  . insulin aspart  0-9 Units Subcutaneous Q4H  . insulin glargine  15 Units Subcutaneous Daily  . lisinopril  20 mg Per Tube BID  . mouth rinse  15 mL Mouth Rinse q12n4p  . metoprolol tartrate  25 mg Per Tube BID  . pantoprazole sodium  40 mg Per Tube BID  . senna-docusate  1 tablet Per Tube BID  . sodium chloride flush  10-40 mL Intracatheter Q12H   Continuous Infusions: . feeding supplement (OSMOLITE 1.2 CAL) 1,000 mL (05/23/20 0030)    LOS: 18 days   Little Ishikawa, DO [facility] PAGER on secure chat  If 7PM-7AM, please contact night-coverage [email or web address]

## 2020-05-24 LAB — CBC WITH DIFFERENTIAL/PLATELET
Abs Immature Granulocytes: 0.1 10*3/uL — ABNORMAL HIGH (ref 0.00–0.07)
Basophils Absolute: 0.1 10*3/uL (ref 0.0–0.1)
Basophils Relative: 1 %
Eosinophils Absolute: 0.3 10*3/uL (ref 0.0–0.5)
Eosinophils Relative: 2 %
HCT: 26.6 % — ABNORMAL LOW (ref 39.0–52.0)
Hemoglobin: 7.7 g/dL — ABNORMAL LOW (ref 13.0–17.0)
Immature Granulocytes: 1 %
Lymphocytes Relative: 8 %
Lymphs Abs: 1 10*3/uL (ref 0.7–4.0)
MCH: 30.1 pg (ref 26.0–34.0)
MCHC: 28.9 g/dL — ABNORMAL LOW (ref 30.0–36.0)
MCV: 103.9 fL — ABNORMAL HIGH (ref 80.0–100.0)
Monocytes Absolute: 1.1 10*3/uL — ABNORMAL HIGH (ref 0.1–1.0)
Monocytes Relative: 8 %
Neutro Abs: 10.7 10*3/uL — ABNORMAL HIGH (ref 1.7–7.7)
Neutrophils Relative %: 80 %
Platelets: 242 10*3/uL (ref 150–400)
RBC: 2.56 MIL/uL — ABNORMAL LOW (ref 4.22–5.81)
RDW: 19.2 % — ABNORMAL HIGH (ref 11.5–15.5)
WBC: 13.2 10*3/uL — ABNORMAL HIGH (ref 4.0–10.5)
nRBC: 0 % (ref 0.0–0.2)

## 2020-05-24 LAB — COMPREHENSIVE METABOLIC PANEL
ALT: 147 U/L — ABNORMAL HIGH (ref 0–44)
AST: 122 U/L — ABNORMAL HIGH (ref 15–41)
Albumin: 2.4 g/dL — ABNORMAL LOW (ref 3.5–5.0)
Alkaline Phosphatase: 215 U/L — ABNORMAL HIGH (ref 38–126)
Anion gap: 11 (ref 5–15)
BUN: 74 mg/dL — ABNORMAL HIGH (ref 8–23)
CO2: 22 mmol/L (ref 22–32)
Calcium: 9.2 mg/dL (ref 8.9–10.3)
Chloride: 117 mmol/L — ABNORMAL HIGH (ref 98–111)
Creatinine, Ser: 1.55 mg/dL — ABNORMAL HIGH (ref 0.61–1.24)
GFR, Estimated: 50 mL/min — ABNORMAL LOW (ref >60)
Glucose, Bld: 134 mg/dL — ABNORMAL HIGH (ref 70–99)
Potassium: 4.7 mmol/L (ref 3.5–5.1)
Sodium: 150 mmol/L — ABNORMAL HIGH (ref 135–145)
Total Bilirubin: 0.6 mg/dL (ref 0.3–1.2)
Total Protein: 8.2 g/dL — ABNORMAL HIGH (ref 6.5–8.1)

## 2020-05-24 LAB — GLUCOSE, CAPILLARY
Glucose-Capillary: 124 mg/dL — ABNORMAL HIGH (ref 70–99)
Glucose-Capillary: 116 mg/dL — ABNORMAL HIGH (ref 70–99)
Glucose-Capillary: 141 mg/dL — ABNORMAL HIGH (ref 70–99)
Glucose-Capillary: 136 mg/dL — ABNORMAL HIGH (ref 70–99)
Glucose-Capillary: 124 mg/dL — ABNORMAL HIGH (ref 70–99)

## 2020-05-24 NOTE — Progress Notes (Signed)
Patient had the soft splint on his Rt elbow  from 11pm to 5 am.  toleatated well,

## 2020-05-24 NOTE — Progress Notes (Signed)
Yellow immunization paper given to patient and placed at bedside. Flu vaccine indicated on paper.

## 2020-05-24 NOTE — Progress Notes (Signed)
PROGRESS NOTE    Densel Kronick  KPQ:[id number] DOB: 05-28-1956 DOA: 05/05/2020 PCP: Patient, No Pcp Per   Brief Narrative:  HPI per Dr. Kerney Elbe on 05/05/20 Vonnie Ligman is an 64 y.o. male with a PMHx of alcohol use and tobacco abuse who presented to the ED via EMS as a Code Stroke for acute onset of right sided weakness. he also had sensory loss and weakness involving his RUE and RLE. 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 Neurosurgery was consulted for EVD consideration which was then deferred.  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  Transferred under [facility] on 05/09/2020.  On 05/08/2020, patient spiked fever and he was started on broad-spectrum antibiotics/IV Zosyn empirically.  Despite of antibiotics, patient's white blood cells as well as fever continue to get worse with a T-max of 103.2 at around noon on 05/11/2020.  Previous hospitalist Dr. Shelton Silvas had discussed case with ID who recommended scanning his chest and abdomen and continuing antibiotics.  CT abdomen chest and pelvis shows bibasilar infiltrates suspecting possible pneumonia but no other pathology. 2D Echo EF 60-65%. UDS positive for cocaine  Assessment & Plan:   Active Problems:   ICH (intracerebral hemorrhage) (HCC)   Polysubstance abuse (HCC)   Dysphagia, post-stroke   Hyperglycemia   Hypokalemia   Hypomagnesemia   Fever   SOB (shortness of breath)   Leukocytosis   Aspiration pneumonia of both lower lobes ([place])   Palliative care by specialist   Goals of care, counseling/discussion   DNR (do not resuscitate) discussion   Protein-calorie malnutrition, severe   Acute Hemorrhagic Stroke/cerebral edema - [name] ICH w/ IVH d/t severe hypertension in setting of alcohol and cocaine use - Code Stroke CT head 10/6 showed L thalamocapsular IPH 7cc w/ minimal edema, mild IVH. Small vessel disease. Atrophy.   - CT head 05/05/20 shows enlarging L thalamocapsular IPH w/ increasing IVH from L ventricle into 3rd and 4th ventricles. - CT head 10/7 0510 stable L thalamic IPH w/ IVH w/ clearing of blood in 4th ventricle.  - CTA head and neck 10/8 unremarkable angio, slight increase in midline shift and ventriculomegaly - MRI unable to be performed - Repeat CT head 10/9 suboptimal but stable - 2D Echo EF 60-65% - UDS positive for cocaine - No antithrombotic prior to admission, now on No antithrombotic given IPH - Therapy recommendations: CIR vs SNF PENDING admission - Repeat CT head on 05/12/2020 essentially showed stable left thalamic hemorrhage and intraventricular extension since 05/08/2020.  Regional edema, mild regional mass-effect and mild lateral ventriculomegaly and trace SAH.  Neurology signed off on 05/14/2020.  Hypertensive Emergency, resolving - Per EMS, SBP > 200  - Home meds:  none - Now On Amlodipine 10, metoprolol, and lisinopril 20 bid and as needed IV hydralazine.  Hyperlipidemia - Home meds:  None - LDL 80, goal < 70 - No statin in setting of IPH  Mildly elevated AST/ALT/Alk phos/Hypernatremia, resolving - Likely in the setting of dehydration/NPO status peri-procedure for nearly 48h - Appropriately downtrending with resumption of free water flushes. - Rule out secondary causes, ROS limited - abdominal exam benign    Component Value Date/Time   PROT 8.2 (H) 05/24/2020 0601   ALBUMIN 2.4 (L) 05/24/2020 0601   AST 122 (H) 05/24/2020 0601   ALT 147 (H) 05/24/2020 0601   ALKPHOS 215 (H) 05/24/2020 0601   BILITOT 0.6 05/24/2020 0601  BILIDIR 0.2 05/09/2020 0827   IBILI 0.3 05/09/2020 0827    Dysphagia - Secondary to stroke - C/W NPO; SLP on board.  PEG tube placed 05/21/2020, discontinue NG tube 05/22/20 - Single episode fever yesterday, noted to be quite supine while receiving tube feeds, chest x-ray concerning for small aspiration, continue to recommend sitting patient  upright at least 30 degrees while tube feedings ongoing.  No indication for antibiotics at this point given single episode of fever, cultures pending, not hypoxic.   Tobacco Abuse - Patient is a current smoker and will need smoking cessation counseling given once he is more awake  Cocaine abuse - UDS positive for cocaine - Avoid beta-blocker  Hypophosphatemia - Resolved  Hypokalemia - Resolved  Normocytic Anemia, unclear etiology - Patient's Hgb/Hct trending down from 12.0/38.3 -> 11.9/37.9 -> 10.7/34.1 and is trending down to 9.5/30.6 -> 8.7/28.4> 6.8/22.1 received 2 units of PRBC on 05/12/2020> 10.4/32.5. - He was previously having black stools so iron supplementation was stopped.  FOBT positive -GI was consulted - Dr. Hilarie Fredrickson recommends next up in evaluating would be a video capsule endoscopy but it cannot be formed due to his recent CVA and dysphagia and since his inability swallowed it would have to be done endoscopically and placed via EGD which is certainly high risk procedure at this time.  Anemia stabilized at this time, no further indication for intervention or treatment.  Sepsis in the setting of Aspiration Pneumonia, likely POA, resolved  - Likely in the setting of aspiration pneumonia. - CT chest shows bibasilar pneumonia.  CT abdomen pelvis negative.  Culture negative so far.  Last temperature 100.9 at 11 AM on 05/12/2020.  Afebrile since then.  ID thinks that his fever is coming from [place].  However they added Flagyl to his current regime of Rocephin and Zithromax on 05/13/2020 and all antibiotics were discontinued on 05/15/2020 per ID recommendations.  Leukocytosis improving slowly. fortunately, he has remained afebrile since 05/12/2020.  GERD - C/W Pantorpazole 40 mg per Tube Daily qHS and will increase to twice daily   Hypernatremia/hyperchloremia - Mild hyperchloremia but hyponatremia resolved.  Thrombocytopenia - Resolved.  DVT prophylaxis: SCDs Code Status: FULL  CODE Family Communication: Girlfriend previously updated- lengthy discussion about need for PEG tube and transition to physical therapy/rehab in the next few days, she is agreeable. Disposition Plan: Mental status stable, PEG tube placed and functioning - awaiting insurance approval/bed availability for discharge.  Status is: Inpatient  Remains inpatient appropriate because:Altered mental status, Unsafe d/c plan, IV treatments appropriate due to intensity of illness or inability to take PO and Inpatient level of care appropriate due to severity of illness   Dispo: The patient is from: Home              Anticipated d/c is to: CIR vs. SNF              Anticipated d/c date is: 1 day              Patient currently is currently medically stable for discharge.  Consultants:   Neurology  Gastroenterology  Case was discussed with infectious diseases Dr. Tommy Medal  Antimicrobials:  Anti-infectives (From admission, onward)   Start     Dose/Rate Route Frequency Ordered Stop   05/21/20 1515  ceFAZolin (ANCEF) IVPB 2g/100 mL premix        2 g 200 mL/hr over 30 Minutes Intravenous To Radiology 05/21/20 1429 05/21/20 2100   05/14/20 1000  cefTRIAXone (ROCEPHIN) 2 g  in sodium chloride 0.9 % 100 mL IVPB        2 g 200 mL/hr over 30 Minutes Intravenous Daily 05/13/20 1454 05/15/20 1255   05/13/20 2200  metroNIDAZOLE (FLAGYL) tablet 500 mg        500 mg Per Tube Every 8 hours 05/13/20 1847 05/15/20 2219   05/13/20 1400  metroNIDAZOLE (FLAGYL) tablet 500 mg  Status:  Discontinued        500 mg Oral Every 8 hours 05/13/20 0935 05/13/20 1847   05/12/20 0900  cefTRIAXone (ROCEPHIN) 1 g in sodium chloride 0.9 % 100 mL IVPB  Status:  Discontinued        1 g 200 mL/hr over 30 Minutes Intravenous Daily 05/12/20 0817 05/13/20 1454   05/12/20 0900  azithromycin (ZITHROMAX) 500 mg in sodium chloride 0.9 % 250 mL IVPB  Status:  Discontinued        500 mg 250 mL/hr over 60 Minutes Intravenous Daily 05/12/20  0817 05/13/20 0935   05/09/20 0930  Ampicillin-Sulbactam (UNASYN) 3 g in sodium chloride 0.9 % 100 mL IVPB  Status:  Discontinued        3 g 200 mL/hr over 30 Minutes Intravenous Every 8 hours 05/09/20 0837 05/09/20 0839   05/09/20 0930  Ampicillin-Sulbactam (UNASYN) 3 g in sodium chloride 0.9 % 100 mL IVPB  Status:  Discontinued        3 g 200 mL/hr over 30 Minutes Intravenous Every 6 hours 05/09/20 0839 05/12/20 0817        Subjective: No acute issues or events overnight tolerating PEG tube placement quite well, continues to follow simple commands/yes/no questions by shaking his head yes or no.  Objective: Vitals:   05/23/20 1713 05/23/20 2025 05/23/20 2324 05/24/20 0503  BP: (!) 97/56 (!) 99/52 (!) 89/50 (!) 107/57  Pulse: 99 96 89 98  Resp: _0 Temp: 98.9 F (37.2 C) 98.9 F (37.2 C) 99.6 F (37.6 C) 99.1 F (37.3 C)  TempSrc: Axillary Oral Oral Oral  SpO2: 100% 98% 100% 96%  Weight:        Intake/Output Summary (Last 24 hours) at 05/24/2020 0734 Last data filed at 05/23/2020 2100 Gross per 24 hour  Intake 1402.75 ml  Output 1850 ml  Net -447.25 ml   Filed Weights   05/19/20 0357 05/20/20 0539 05/23/20 0545  Weight: 65.8 kg 63.3 kg 60.7 kg   Examination: Physical Exam:  General exam: Appears calm and comfortable and disoriented Respiratory system: Clear to auscultation. Respiratory effort normal. Cardiovascular system: S1 & S2 heard, RRR. No JVD, murmurs, rubs, gallops or clicks. No pedal edema. Gastrointestinal system: Abdomen is nondistended, soft and nontender. No organomegaly or masses felt. Normal bowel sounds heard. PEG tube bandage clean/dry/intact Central nervous system: Lethargic unable to assess orientation. Not following any commands in any meaningful way.  Data Reviewed: I have personally reviewed following labs and imaging studies  CBC: Recent Labs  Lab 05/20/20 0248 05/21/20 0148 05/22/20 0226 05/23/20 0244 05/24/20 0601  WBC  15.7* 10.2 13.5* 11.6* 13.2*  NEUTROABS 12.1* 7.4 10.8* 9.0* PENDING  HGB 7.3* 7.3* 7.6* 7.2* 7.7*  HCT 24.8* 25.3* 26.3* 25.4* 26.6*  MCV 100.4* 102.4* 101.5* 105.4* 103.9*  PLT 307 296 319 258 381   Basic Metabolic Panel: Recent Labs  Lab 05/18/20 0309 05/18/20 0309 05/20/20 0758 05/21/20 0148 05/22/20 0226 05/23/20 0244 05/24/20 0601  NA 148*   < > 150* 153* 151* 150* 150*  K 4.1   < >  4.2 4.0 4.3 4.5 4.7  CL 118*   < > 121* 120* 120* 119* 117*  CO2 23   < > 19* 25 23 21* 22  GLUCOSE 147*   < > 96 93 152* 148* 134*  BUN 30*   < > 42* 52* 67* 84* 74*  CREATININE 1.01   < > 1.28* 1.33* 1.75* 1.67* 1.55*  CALCIUM 9.1   < > 9.2 9.3 9.3 9.0 9.2  MG 2.5*  --   --   --   --   --   --    < > = values in this interval not displayed.   GFR: Estimated Creatinine Clearance: 41.3 mL/min (A) (by C-G formula based on SCr of 1.55 mg/dL (H)). Liver Function Tests: Recent Labs  Lab 05/20/20 0758 05/21/20 0148 05/22/20 0226 05/23/20 0244 05/24/20 0601  AST 101* 146* 91* 99* 122*  ALT 124* 166* 129* 124* 147*  ALKPHOS 158* 212* 187* 181* 215*  BILITOT 0.6 0.5 0.7 0.4 0.6  PROT 7.5 7.9 8.0 7.5 8.2*  ALBUMIN 2.2* 2.4* 2.3* 2.3* 2.4*   No results for input(s): LIPASE, AMYLASE in the last 168 hours. No results for input(s): AMMONIA in the last 168 hours. Coagulation Profile: Recent Labs  Lab 05/20/20 0248  INR 1.1   Cardiac Enzymes: No results for input(s): CKTOTAL, CKMB, CKMBINDEX, TROPONINI in the last 168 hours. BNP (last 3 results) No results for input(s): PROBNP in the last 8760 hours. HbA1C: No results for input(s): HGBA1C in the last 72 hours. CBG: Recent Labs  Lab 05/23/20 1650 05/23/20 1941 05/23/20 2333 05/24/20 0529 05/24/20 0730  GLUCAP 111* 115* 123* 124* 116*   Lipid Profile: No results for input(s): CHOL, HDL, LDLCALC, TRIG, CHOLHDL, LDLDIRECT in the last 72 hours. Thyroid Function Tests: No results for input(s): TSH, T4TOTAL, FREET4, T3FREE,  THYROIDAB in the last 72 hours. Anemia Panel: No results for input(s): VITAMINB12, FOLATE, FERRITIN, TIBC, IRON, RETICCTPCT in the last 72 hours. Sepsis Labs: No results for input(s): PROCALCITON, LATICACIDVEN in the last 168 hours.  Recent Results (from the past 240 hour(s))  Surgical pcr screen     Status: None   Collection Time: 05/20/20  3:00 AM   Specimen: Nasal Mucosa; Nasal Swab  Result Value Ref Range Status   MRSA, PCR NEGATIVE NEGATIVE Final   Staphylococcus aureus NEGATIVE NEGATIVE Final    Comment: (NOTE) The Xpert SA Assay (FDA approved for NASAL specimens in patients 87 years of age and older), is one component of a comprehensive surveillance program. It is not intended to diagnose infection nor to guide or monitor treatment. Performed at [facility], [place] [address] [place], [place] [address]      RN Pressure Injury Documentation: Estimated body mass index is 21.6 kg/m as calculated from the following:   Height as of 04/09/20: _0  (1.676 m).   Weight as of this encounter: 60.7 kg. Malnutrition Type: Nutrition Problem: Severe Malnutrition Etiology: social / environmental circumstances Malnutrition Characteristics: Signs/Symptoms: severe fat depletion, severe muscle depletion Nutrition Interventions: Interventions: Tube feeding  Radiology Studies: DG Chest Port 1 View  Result Date: 05/23/2020 CLINICAL DATA:  Hypoxia EXAM: PORTABLE CHEST 1 VIEW COMPARISON:  None. FINDINGS: The heart size and mediastinal contours are within normal limits. Again noted is mildly increased interstitial markings seen at both lung bases, left worse than right. No acute osseous abnormality. IMPRESSION: Mildly increased interstitial markings at the left lung base which could be due to atelectasis and/or infectious etiology.  Electronically Signed   By: Prudencio Pair M.D.   On: 05/23/2020 20:11   Scheduled Meds: . amLODipine  10 mg Per Tube Daily  . chlorhexidine  15 mL Mouth  Rinse BID  . free water  200 mL Per Tube Q6H  . influenza vac split quadrivalent PF  0.5 mL Intramuscular Tomorrow-1000  . insulin aspart  0-9 Units Subcutaneous Q4H  . insulin glargine  15 Units Subcutaneous Daily  . lisinopril  20 mg Per Tube BID  . mouth rinse  15 mL Mouth Rinse q12n4p  . metoprolol tartrate  25 mg Per Tube BID  . pantoprazole sodium  40 mg Per Tube BID  . senna-docusate  1 tablet Per Tube BID  . sodium chloride flush  10-40 mL Intracatheter Q12H   Continuous Infusions: . feeding supplement (OSMOLITE 1.2 CAL) 1,000 mL (05/23/20 1844)    LOS: 19 days   Little Ishikawa, DO [facility] PAGER on secure chat  If 7PM-7AM, please contact night-coverage [email or web address]

## 2020-05-24 NOTE — Progress Notes (Signed)
  Speech Language Pathology Treatment: Dysphagia;Cognitive-Linquistic (Aphasia)  Patient Details Name: Alan Everett MRN: [id number] DOB: 1955/08/08 Today's Date: 05/24/2020 Time: 2025-4270 SLP Time Calculation (min) (ACUTE ONLY): 22 min  Assessment / Plan / Recommendation Clinical Impression  Pt was seen for treatment and was alert throughout the session. Pt was resistant to oral care despite verbal prompts, tactile cues, and encouragement. Pt subsequently accepted limited trials of ice chips with reduced bolus manipulation and ultimate throat clearing and coughing. Pt did not accept any additional trials of other consistencies. Pt demonstrated 33% accuracy with simple yes/no questions when repetitions were provided. He followed 1-step commands with 25% accuracy when models were given. He did not produce any automatic sequences despite verbal prompts and cues. He achieved 0% accuracy with confrontational naming despite phonemic, semantic, and orthographic cues. Pt produced multiple utterances with reduced articulatory precision and limited speech intelligibility; however, "mhhmmm", "oh okay", I said", and "alright" were intelligible.  SLP will continue to follow pt.    HPI HPI: Alan Everett is a 64 y.o. male with a PMHx of alcohol use and tobacco abuse who presents acutely to the ED via EMS as a Code Stroke for acute onset of right sided weakness. Patient with disoriented, right leg weakness, right limb ataxia, right decreased sensation and Global aphasia on exam. 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 Worsening of intraventricular extension.  MBS 10/19 mod-severe dysphagia, continue NPO.       SLP Plan  Continue with current plan of care       Recommendations  Diet recommendations: NPO Medication Administration: Via alternative means                Oral Care Recommendations: Oral care QID Follow up Recommendations: Skilled Nursing facility SLP Visit Diagnosis:  Dysphagia, oropharyngeal phase (R13.12) Plan: Continue with current plan of care       Alan Everett I. Vear Clock, MS, CCC-SLP Acute Rehabilitation Services Office number [phone] Pager [phone]                 Alan Everett 05/24/2020, 3:59 PM

## 2020-05-25 DIAGNOSIS — E43 Unspecified severe protein-calorie malnutrition: Secondary | ICD-10-CM

## 2020-05-25 LAB — COMPREHENSIVE METABOLIC PANEL
ALT: 114 U/L — ABNORMAL HIGH (ref 0–44)
AST: 98 U/L — ABNORMAL HIGH (ref 15–41)
Albumin: 2 g/dL — ABNORMAL LOW (ref 3.5–5.0)
Alkaline Phosphatase: 185 U/L — ABNORMAL HIGH (ref 38–126)
Anion gap: 11 (ref 5–15)
BUN: 84 mg/dL — ABNORMAL HIGH (ref 8–23)
CO2: 22 mmol/L (ref 22–32)
Calcium: 8.8 mg/dL — ABNORMAL LOW (ref 8.9–10.3)
Chloride: 120 mmol/L — ABNORMAL HIGH (ref 98–111)
Creatinine, Ser: 1.71 mg/dL — ABNORMAL HIGH (ref 0.61–1.24)
GFR, Estimated: 44 mL/min — ABNORMAL LOW (ref >60)
Glucose, Bld: 140 mg/dL — ABNORMAL HIGH (ref 70–99)
Potassium: 4.6 mmol/L (ref 3.5–5.1)
Sodium: 153 mmol/L — ABNORMAL HIGH (ref 135–145)
Total Bilirubin: 0.5 mg/dL (ref 0.3–1.2)
Total Protein: 7.1 g/dL (ref 6.5–8.1)

## 2020-05-25 LAB — CBC WITH DIFFERENTIAL/PLATELET
Abs Immature Granulocytes: 0.09 10*3/uL — ABNORMAL HIGH (ref 0.00–0.07)
Basophils Absolute: 0 10*3/uL (ref 0.0–0.1)
Basophils Relative: 0 %
Eosinophils Absolute: 0.2 10*3/uL (ref 0.0–0.5)
Eosinophils Relative: 2 %
HCT: 19.5 % — ABNORMAL LOW (ref 39.0–52.0)
Hemoglobin: 5.5 g/dL — CL (ref 13.0–17.0)
Immature Granulocytes: 1 %
Lymphocytes Relative: 9 %
Lymphs Abs: 1 10*3/uL (ref 0.7–4.0)
MCH: 29.6 pg (ref 26.0–34.0)
MCHC: 28.2 g/dL — ABNORMAL LOW (ref 30.0–36.0)
MCV: 104.8 fL — ABNORMAL HIGH (ref 80.0–100.0)
Monocytes Absolute: 1.1 10*3/uL — ABNORMAL HIGH (ref 0.1–1.0)
Monocytes Relative: 10 %
Neutro Abs: 8.2 10*3/uL — ABNORMAL HIGH (ref 1.7–7.7)
Neutrophils Relative %: 78 %
Platelets: 244 10*3/uL (ref 150–400)
RBC: 1.86 MIL/uL — ABNORMAL LOW (ref 4.22–5.81)
RDW: 18.4 % — ABNORMAL HIGH (ref 11.5–15.5)
WBC: 10.6 10*3/uL — ABNORMAL HIGH (ref 4.0–10.5)
nRBC: 0 % (ref 0.0–0.2)

## 2020-05-25 LAB — HEMOGLOBIN AND HEMATOCRIT, BLOOD
HCT: 20.7 % — ABNORMAL LOW (ref 39.0–52.0)
Hemoglobin: 5.8 g/dL — CL (ref 13.0–17.0)

## 2020-05-25 LAB — GLUCOSE, CAPILLARY
Glucose-Capillary: 116 mg/dL — ABNORMAL HIGH (ref 70–99)
Glucose-Capillary: 125 mg/dL — ABNORMAL HIGH (ref 70–99)
Glucose-Capillary: 127 mg/dL — ABNORMAL HIGH (ref 70–99)
Glucose-Capillary: 129 mg/dL — ABNORMAL HIGH (ref 70–99)
Glucose-Capillary: 130 mg/dL — ABNORMAL HIGH (ref 70–99)
Glucose-Capillary: 138 mg/dL — ABNORMAL HIGH (ref 70–99)

## 2020-05-25 LAB — PREPARE RBC (CROSSMATCH)

## 2020-05-25 MED ORDER — SODIUM CHLORIDE 0.9% IV SOLUTION: Freq: Once | INTRAVENOUS | Status: AC

## 2020-05-25 MED ORDER — SODIUM CHLORIDE 0.9% IV SOLUTION: Freq: Once | INTRAVENOUS | Status: DC

## 2020-05-25 NOTE — Progress Notes (Signed)
PROGRESS NOTE    Alan Everett  KNL:[id number] DOB: 1955/09/20 DOA: 05/05/2020 PCP: Patient, No Pcp Per   Brief Narrative:  HPI per Dr. Kerney Elbe on 05/05/20 Alan Everett is an 64 y.o. male with a PMHx of alcohol use and tobacco abuse who presented to the ED via EMS as a Code Stroke for acute onset of right sided weakness. he also had sensory loss and weakness involving his RUE and RLE. 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 Neurosurgery was consulted for EVD consideration which was then deferred.  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  Transferred under [place] on 05/09/2020.  On 05/08/2020, patient spiked fever and he was started on broad-spectrum antibiotics/IV Zosyn empirically.  Despite of antibiotics, patient's white blood cells as well as fever continue to get worse with a T-max of 103.2 at around noon on 05/11/2020.  Previous hospitalist Dr. Shelton Silvas had discussed case with ID who recommended scanning his chest and abdomen and continuing antibiotics.  CT abdomen chest and pelvis shows bibasilar infiltrates suspecting possible pneumonia but no other pathology. 2D Echo EF 60-65%. UDS positive for cocaine  Assessment & Plan:   Active Problems:   ICH (intracerebral hemorrhage) (HCC)   Polysubstance abuse (HCC)   Dysphagia, post-stroke   Hyperglycemia   Hypokalemia   Hypomagnesemia   Fever   SOB (shortness of breath)   Leukocytosis   Aspiration pneumonia of both lower lobes ([place])   Palliative care by specialist   Goals of care, counseling/discussion   DNR (do not resuscitate) discussion   Protein-calorie malnutrition, severe   Normocytic Anemia, unclear etiology; acutely worsening - Hgb previously quite stable - 5.5 overnight 10/26 - confirmed 5.8 this morning - He was previously having black stools so iron supplementation was stopped.  FOBT positive -GI was  consulted - Dr. Hilarie Fredrickson recommends next up in evaluating would be a video capsule endoscopy but it cannot be formed due to his recent CVA and dysphagia and since his inability swallowed it would have to be done endoscopically and placed via EGD which is certainly high risk procedure at this time. - Will sideline GI again for further discussion - unlikely to perform procedure given no change in PO status and high risk for endoscopy.  Acute Hemorrhagic Stroke/cerebral edema - [name] ICH w/ IVH d/t severe hypertension in setting of alcohol and cocaine use - Code Stroke CT head 10/6 showed L thalamocapsular IPH 7cc w/ minimal edema, mild IVH. Small vessel disease. Atrophy.  - CT head 05/05/20 shows enlarging L thalamocapsular IPH w/ increasing IVH from L ventricle into 3rd and 4th ventricles. - CT head 10/7 0510 stable L thalamic IPH w/ IVH w/ clearing of blood in 4th ventricle.  - CTA head and neck 10/8 unremarkable angio, slight increase in midline shift and ventriculomegaly - MRI unable to be performed - Repeat CT head 10/9 suboptimal but stable - 2D Echo EF 60-65% - UDS positive for cocaine - No antithrombotic prior to admission, now on No antithrombotic given IPH - Therapy recommendations: CIR vs SNF PENDING admission - Repeat CT head on 05/12/2020 essentially showed stable left thalamic hemorrhage and intraventricular extension since 05/08/2020.  Regional edema, mild regional mass-effect and mild lateral ventriculomegaly and trace SAH.  Neurology signed off on 05/14/2020.  Hypertensive Emergency, resolving - Per EMS, SBP > 200  - Home meds:  none - Now On Amlodipine 10, metoprolol,  and lisinopril 20 bid and as needed IV hydralazine.  Hyperlipidemia - Home meds:  None - LDL 80, goal < 70 - No statin in setting of IPH  Mildly elevated AST/ALT/Alk phos/Hypernatremia, resolving - Likely in the setting of dehydration/NPO status peri-procedure for nearly 48h - Appropriately downtrending with  resumption of free water flushes. - Rule out secondary causes, ROS limited - abdominal exam benign    Component Value Date/Time   PROT 7.1 05/25/2020 0517   ALBUMIN 2.0 (L) 05/25/2020 0517   AST 98 (H) 05/25/2020 0517   ALT 114 (H) 05/25/2020 0517   ALKPHOS 185 (H) 05/25/2020 0517   BILITOT 0.5 05/25/2020 0517   BILIDIR 0.2 05/09/2020 0827   IBILI 0.3 05/09/2020 0827    Dysphagia - Secondary to stroke - C/W NPO; SLP on board.  PEG tube placed 05/21/2020, discontinue NG tube 05/22/20 - Single episode fever yesterday, noted to be quite supine while receiving tube feeds, chest x-ray concerning for small aspiration, continue to recommend sitting patient upright at least 30 degrees while tube feedings ongoing.  No indication for antibiotics at this point given single episode of fever, cultures pending, not hypoxic.   Tobacco Abuse - Patient is a current smoker and will need smoking cessation counseling given once he is more awake  Cocaine abuse - UDS positive for cocaine - Avoid beta-blocker  Hypophosphatemia - Resolved  Hypokalemia - Resolved  Sepsis in the setting of Aspiration Pneumonia, likely POA, resolved  - Likely in the setting of aspiration pneumonia. - CT chest shows bibasilar pneumonia.  CT abdomen pelvis negative.  Culture negative so far. ID thinks that his fever is coming from [place].  However they added Flagyl to his current regime of Rocephin and Zithromax on 05/13/2020 and all antibiotics were discontinued on 05/15/2020 per ID recommendations.  Leukocytosis improving slowly.  - Recurrent single fever 10/24 - continue to follow clinically  GERD - C/W Pantorpazole 40 mg per Tube Daily qHS and will increase to twice daily   Hypernatremia/hyperchloremia - Mild hyperchloremia but hyponatremia resolved.  Thrombocytopenia - Resolved.  DVT prophylaxis: SCDs Code Status: FULL CODE Family Communication: Girlfriend previously updated- lengthy discussion about need for  PEG tube and transition to physical therapy/rehab in the next few days, she is agreeable. Disposition Plan: Mental status stable, PEG tube placed and functioning - awaiting insurance approval/bed availability for discharge.  Status is: Inpatient  Remains inpatient appropriate because:Altered mental status, Unsafe d/c plan, IV treatments appropriate due to intensity of illness or inability to take PO and Inpatient level of care appropriate due to severity of illness   Dispo: The patient is from: Home              Anticipated d/c is to: CIR vs. SNF              Anticipated d/c date is: 1 day              Patient currently is currently medically stable for discharge.  Consultants:   Neurology  Gastroenterology  ID  Antimicrobials:  Anti-infectives (From admission, onward)   Start     Dose/Rate Route Frequency Ordered Stop   05/21/20 1515  ceFAZolin (ANCEF) IVPB 2g/100 mL premix        2 g 200 mL/hr over 30 Minutes Intravenous To Radiology 05/21/20 1429 05/21/20 2100   05/14/20 1000  cefTRIAXone (ROCEPHIN) 2 g in sodium chloride 0.9 % 100 mL IVPB        2 g 200  mL/hr over 30 Minutes Intravenous Daily 05/13/20 1454 05/15/20 1255   05/13/20 2200  metroNIDAZOLE (FLAGYL) tablet 500 mg        500 mg Per Tube Every 8 hours 05/13/20 1847 05/15/20 2219   05/13/20 1400  metroNIDAZOLE (FLAGYL) tablet 500 mg  Status:  Discontinued        500 mg Oral Every 8 hours 05/13/20 0935 05/13/20 1847   05/12/20 0900  cefTRIAXone (ROCEPHIN) 1 g in sodium chloride 0.9 % 100 mL IVPB  Status:  Discontinued        1 g 200 mL/hr over 30 Minutes Intravenous Daily 05/12/20 0817 05/13/20 1454   05/12/20 0900  azithromycin (ZITHROMAX) 500 mg in sodium chloride 0.9 % 250 mL IVPB  Status:  Discontinued        500 mg 250 mL/hr over 60 Minutes Intravenous Daily 05/12/20 0817 05/13/20 0935   05/09/20 0930  Ampicillin-Sulbactam (UNASYN) 3 g in sodium chloride 0.9 % 100 mL IVPB  Status:  Discontinued        3 g 200  mL/hr over 30 Minutes Intravenous Every 8 hours 05/09/20 0837 05/09/20 0839   05/09/20 0930  Ampicillin-Sulbactam (UNASYN) 3 g in sodium chloride 0.9 % 100 mL IVPB  Status:  Discontinued        3 g 200 mL/hr over 30 Minutes Intravenous Every 6 hours 05/09/20 0839 05/12/20 0817        Subjective: No acute issues or events overnight tolerating PEG tube placement quite well, continues to follow simple commands/yes/no questions by shaking his head yes or no.  Objective: Vitals:   05/24/20 1940 05/25/20 0028 05/25/20 0351 05/25/20 0352  BP: (!) 100/55 (!) 104/54 (!) 104/51   Pulse: 95 (!) 101 (!) 102   Resp: 19 17    Temp: 98.5 F (36.9 C) 98.5 F (36.9 C) 98.7 F (37.1 C)   TempSrc: Axillary Axillary Axillary   SpO2: 95% 100% 100%   Weight:    60.8 kg    Intake/Output Summary (Last 24 hours) at 05/25/2020 0714 Last data filed at 05/25/2020 0343 Gross per 24 hour  Intake 2265 ml  Output 1500 ml  Net 765 ml   Filed Weights   05/20/20 0539 05/23/20 0545 05/25/20 0352  Weight: 63.3 kg 60.7 kg 60.8 kg   Examination: Physical Exam:  General exam: Appears calm and comfortable and disoriented Respiratory system: Clear to auscultation. Respiratory effort normal. Cardiovascular system: S1 & S2 heard, RRR. No JVD, murmurs, rubs, gallops or clicks. No pedal edema. Gastrointestinal system: Abdomen is nondistended, soft and nontender. No organomegaly or masses felt. Normal bowel sounds heard. PEG tube bandage clean/dry/intact Central nervous system: Lethargic unable to assess orientation. Not following any commands in any meaningful way.  Data Reviewed: I have personally reviewed following labs and imaging studies  CBC: Recent Labs  Lab 05/21/20 0148 05/22/20 0226 05/23/20 0244 05/24/20 0601 05/25/20 0517  WBC 10.2 13.5* 11.6* 13.2* 10.6*  NEUTROABS 7.4 10.8* 9.0* 10.7* 8.2*  HGB 7.3* 7.6* 7.2* 7.7* 5.5*  HCT 25.3* 26.3* 25.4* 26.6* 19.5*  MCV 102.4* 101.5* 105.4* 103.9*  104.8*  PLT 296 319 258 242 008   Basic Metabolic Panel: Recent Labs  Lab 05/21/20 0148 05/22/20 0226 05/23/20 0244 05/24/20 0601 05/25/20 0517  NA 153* 151* 150* 150* 153*  K 4.0 4.3 4.5 4.7 4.6  CL 120* 120* 119* 117* 120*  CO2 25 23 21* 22 22  GLUCOSE 93 152* 148* 134* 140*  BUN 52* 67* 84*  74* 84*  CREATININE 1.33* 1.75* 1.67* 1.55* 1.71*  CALCIUM 9.3 9.3 9.0 9.2 8.8*   GFR: Estimated Creatinine Clearance: 37.5 mL/min (A) (by C-G formula based on SCr of 1.71 mg/dL (H)). Liver Function Tests: Recent Labs  Lab 05/21/20 0148 05/22/20 0226 05/23/20 0244 05/24/20 0601 05/25/20 0517  AST 146* 91* 99* 122* 98*  ALT 166* 129* 124* 147* 114*  ALKPHOS 212* 187* 181* 215* 185*  BILITOT 0.5 0.7 0.4 0.6 0.5  PROT 7.9 8.0 7.5 8.2* 7.1  ALBUMIN 2.4* 2.3* 2.3* 2.4* 2.0*   No results for input(s): LIPASE, AMYLASE in the last 168 hours. No results for input(s): AMMONIA in the last 168 hours. Coagulation Profile: Recent Labs  Lab 05/20/20 0248  INR 1.1   Cardiac Enzymes: No results for input(s): CKTOTAL, CKMB, CKMBINDEX, TROPONINI in the last 168 hours. BNP (last 3 results) No results for input(s): PROBNP in the last 8760 hours. HbA1C: No results for input(s): HGBA1C in the last 72 hours. CBG: Recent Labs  Lab 05/24/20 1208 05/24/20 1609 05/24/20 1944 05/25/20 0008 05/25/20 0338  GLUCAP 141* 136* 124* 138* 127*   Lipid Profile: No results for input(s): CHOL, HDL, LDLCALC, TRIG, CHOLHDL, LDLDIRECT in the last 72 hours. Thyroid Function Tests: No results for input(s): TSH, T4TOTAL, FREET4, T3FREE, THYROIDAB in the last 72 hours. Anemia Panel: No results for input(s): VITAMINB12, FOLATE, FERRITIN, TIBC, IRON, RETICCTPCT in the last 72 hours. Sepsis Labs: No results for input(s): PROCALCITON, LATICACIDVEN in the last 168 hours.  Recent Results (from the past 240 hour(s))  Surgical pcr screen     Status: None   Collection Time: 05/20/20  3:00 AM   Specimen: Nasal  Mucosa; Nasal Swab  Result Value Ref Range Status   MRSA, PCR NEGATIVE NEGATIVE Final   Staphylococcus aureus NEGATIVE NEGATIVE Final    Comment: (NOTE) The Xpert SA Assay (FDA approved for NASAL specimens in patients 59 years of age and older), is one component of a comprehensive surveillance program. It is not intended to diagnose infection nor to guide or monitor treatment. Performed at [facility], [place] [address], [place] [address], [place] [address]   Culture, blood (routine x 2)     Status: None (Preliminary result)   Collection Time: 05/24/20 10:08 AM   Specimen: BLOOD  Result Value Ref Range Status   Specimen Description BLOOD RIGHT ANTECUBITAL  Final   Special Requests   Final    BOTTLES DRAWN AEROBIC ONLY Blood Culture results may not be optimal due to an inadequate volume of blood received in culture bottles   Culture   Final    NO GROWTH < 24 HOURS Performed at [facility], [place] [address] [place], [place] [address]    Report Status PENDING  Incomplete  Culture, blood (routine x 2)     Status: None (Preliminary result)   Collection Time: 05/24/20 10:14 AM   Specimen: BLOOD  Result Value Ref Range Status   Specimen Description BLOOD RIGHT ANTECUBITAL  Final   Special Requests   Final    BOTTLES DRAWN AEROBIC AND ANAEROBIC Blood Culture adequate volume   Culture   Final    NO GROWTH < 24 HOURS Performed at [facility], [place] [address] [place], [place] [address]    Report Status PENDING  Incomplete     RN Pressure Injury Documentation: Estimated body mass index is 21.63 kg/m as calculated from the following:   Height as of 04/09/20: 5' 6"  (1.676 m).  Weight as of this encounter: 60.8 kg. Malnutrition Type: Nutrition Problem: Severe Malnutrition Etiology: social / environmental circumstances Malnutrition Characteristics: Signs/Symptoms: severe fat depletion, severe muscle depletion Nutrition Interventions: Interventions: Tube feeding  Radiology  Studies: DG Chest Port 1 View  Result Date: 05/23/2020 CLINICAL DATA:  Hypoxia EXAM: PORTABLE CHEST 1 VIEW COMPARISON:  None. FINDINGS: The heart size and mediastinal contours are within normal limits. Again noted is mildly increased interstitial markings seen at both lung bases, left worse than right. No acute osseous abnormality. IMPRESSION: Mildly increased interstitial markings at the left lung base which could be due to atelectasis and/or infectious etiology. Electronically Signed   By: Prudencio Pair M.D.   On: 05/23/2020 20:11   Scheduled Meds: . sodium chloride   Intravenous Once  . amLODipine  10 mg Per Tube Daily  . chlorhexidine  15 mL Mouth Rinse BID  . free water  200 mL Per Tube Q6H  . insulin aspart  0-9 Units Subcutaneous Q4H  . insulin glargine  15 Units Subcutaneous Daily  . lisinopril  20 mg Per Tube BID  . mouth rinse  15 mL Mouth Rinse q12n4p  . metoprolol tartrate  25 mg Per Tube BID  . pantoprazole sodium  40 mg Per Tube BID  . senna-docusate  1 tablet Per Tube BID  . sodium chloride flush  10-40 mL Intracatheter Q12H   Continuous Infusions: . feeding supplement (OSMOLITE 1.2 CAL) 1,000 mL (05/25/20 0350)    LOS: 20 days   Little Ishikawa, DO [facility] PAGER on secure chat  If 7PM-7AM, please contact night-coverage [email or web address]

## 2020-05-25 NOTE — Progress Notes (Signed)
Occupational Therapy Treatment Patient Details Name: Alan Everett MRN: [id number] DOB: 03/18/1956 Today's Date: 05/25/2020    History of present illness Patient is a 64 y/o male with PMH ETOH use, tobacco abuse, admitted with R side weakness,sensory loss and speech deficits.  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   OT comments  Pt receiving blood on arrival and limited to EOB sitting this session. Pt noted to have decreased BP at EOB attempts so returned to supine. Pt with facial grimace with PROM R side and reaching with L UE toward R side localizing to the areas of discomfort. Ot to continue to recommend [facility] for d/c planning.    Follow Up Recommendations  SNF    Equipment Recommendations  Wheelchair (measurements OT);Wheelchair cushion (measurements OT);Hospital bed    Recommendations for Other Services      Precautions / Restrictions Precautions Precautions: Fall Precaution Comments: L handmitt, R pushing       Mobility Bed Mobility Overal bed mobility: Needs Assistance Bed Mobility: Supine to Sit;Sit to Supine Rolling: Total assist   Supine to sit: Total assist;+2 for physical assistance Sit to supine: Total assist;+2 for physical assistance   General bed mobility comments: Total +2 for trunk and LE management, scooting to and from EOB, and boost up in bed upon return to supine. Pt resistant to mobility.  Transfers                 General transfer comment: deferred due decr BP    Balance Overall balance assessment: Needs assistance Sitting-balance support: No upper extremity supported;Feet supported;Single extremity supported Sitting balance-Leahy Scale: Poor Sitting balance - Comments: requires at least min assist posteriorly to maintain static sitting balance Postural control: Posterior lean     Standing balance comment: not assessed today                           ADL  either performed or assessed with clinical judgement   ADL Overall ADL's : Needs assistance/impaired Eating/Feeding: NPO   Grooming: Oral care;Total assistance Grooming Details (indicate cue type and reason): wiping mouth and pt closing mouth in response for avoidance                               General ADL Comments: pt is total (A) for all adls.     Vision       Perception     Praxis      Cognition Arousal/Alertness: Awake/alert Behavior During Therapy: Flat affect Overall Cognitive Status: Difficult to assess                                 General Comments: not following any commands. reaching with L UE toward R side with PROM attempts. does not make eye contact or verbalize        Exercises General Exercises - Lower Extremity Heel Slides: PROM;Both;10 reps;Supine;Limitations Heel Slides Limitations: resistant and restless with RLE ROM, reaching LUE towards RLE during mobility attempts Other Exercises Other Exercises: attempting PROM R LE with pt grabbing L UE toward R hip Other Exercises: PROM elbow wrist and digits   Shoulder Instructions       General Comments Pt with decreased BP noted sitting so returned to supine 89/52 (64) with bag of blood running  Pertinent Vitals/ Pain       Pain Assessment: Faces Faces Pain Scale: Hurts little more Pain Location: RLE, during ROM Pain Descriptors / Indicators: Grimacing;Restless Pain Intervention(s): Monitored during session;Repositioned  Home Living                                          Prior Functioning/Environment              Frequency  Min 2X/week        Progress Toward Goals  OT Goals(current goals can now be found in the care plan section)  Progress towards OT goals: Not progressing toward goals - comment  Acute Rehab OT Goals Patient Stated Goal: unable to state OT Goal Formulation: Patient unable to participate in goal setting Time For  Goal Achievement: 06/01/20 Potential to Achieve Goals: Fair ADL Goals Pt Will Perform Grooming: with min assist;sitting Pt Will Transfer to Toilet: with max assist;with transfer board;bedside commode Additional ADL Goal #1: pt will look at object in midline 50% of session Additional ADL Goal #2: Pt will be able to track right and left 5 times in succesion with increased time Additional ADL Goal #3: Pt will follow 3 out of 5 one step commands Additional ADL Goal #4: pt will sit EOB min (A) as prep for transfers  Plan Discharge plan remains appropriate    Co-evaluation    PT/OT/SLP Co-Evaluation/Treatment: Yes Reason for Co-Treatment: For patient/therapist safety;Necessary to address cognition/behavior during functional activity PT goals addressed during session: Mobility/safety with mobility;Balance;Strengthening/ROM OT goals addressed during session: ADL's and self-care;Strengthening/ROM      AM-PAC OT "6 Clicks" Daily Activity     Outcome Measure   Help from another person eating meals?: Total Help from another person taking care of personal grooming?: Total Help from another person toileting, which includes using toliet, bedpan, or urinal?: Total Help from another person bathing (including washing, rinsing, drying)?: Total Help from another person to put on and taking off regular upper body clothing?: Total Help from another person to put on and taking off regular lower body clothing?: Total 6 Click Score: 6    End of Session    OT Visit Diagnosis: Other abnormalities of gait and mobility (R26.89);Muscle weakness (generalized) (M62.81);Low vision, both eyes (H54.2);Other symptoms and signs involving cognitive function;Hemiplegia and hemiparesis Hemiplegia - Right/Left: Right Hemiplegia - dominant/non-dominant: Dominant   Activity Tolerance Treatment limited secondary to medical complications (Comment) (decreased BP)   Patient Left in bed;with call bell/phone within  reach;with bed alarm set;with restraints reapplied;with SCD's reapplied   Nurse Communication Mobility status;Precautions        Time: 0737-1062 OT Time Calculation (min): 17 min  Charges: OT General Charges $OT Visit: 1 Visit   Timmothy Euler, OTR/L  Acute Rehabilitation Services Pager: [phone] Office: [phone] .    Mateo Flow 05/25/2020, 2:06 PM

## 2020-05-25 NOTE — Progress Notes (Signed)
CRITICAL VALUE ALERT  Critical Value:  HGB 5.5  Date & Time Notied: 0619  Provider Notified:Dr. Toniann Fail Orders Received/Actions taken: I unit of PRBC's, see epic

## 2020-05-25 NOTE — Progress Notes (Signed)
Physical Therapy Treatment Patient Details Name: Alan Everett MRN: [id number] DOB: August 12, 1955 Today's Date: 05/25/2020    History of Present Illness Patient is a 64 y/o male with PMH ETOH use, tobacco abuse, admitted with R side weakness,sensory loss and speech deficits.  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    PT Comments    Pt receiving blood upon PT and OT arrival to room, RN agreeable to therapy session for pt. Pt required total +2 assist for to and from EOB today, pt actively resisting initial mobility with LUE/LLE. Pt with decreased responsiveness with eye roving at EOB, BP 82/48 with HR of 102. PT and OT quickly returned pt to supine for minimal BP increase to 89/52, HR 97. RN notified of pt response to activity. Pt demonstrating restlessness and grimacing with RLE PROM today. Will continue to follow acutely.     Follow Up Recommendations  SNF;Supervision/Assistance - 24 hour     Equipment Recommendations  Wheelchair cushion (measurements PT);Wheelchair (measurements PT);Hospital bed;Other (comment)    Recommendations for Other Services       Precautions / Restrictions Precautions Precautions: Fall Precaution Comments: L handmitt, R pushing    Mobility  Bed Mobility Overal bed mobility: Needs Assistance Bed Mobility: Supine to Sit;Sit to Supine     Supine to sit: Total assist;+2 for physical assistance Sit to supine: Total assist;+2 for physical assistance   General bed mobility comments: Total +2 for trunk and LE management, scooting to and from EOB, and boost up in bed upon return to supine. Pt resistant to mobility.  Transfers                 General transfer comment: NT - hypotension  Ambulation/Gait                 Stairs             Wheelchair Mobility    Modified Rankin (Stroke Patients Only) Modified Rankin (Stroke Patients Only) Pre-Morbid Rankin Score: No  symptoms Modified Rankin: Severe disability     Balance Overall balance assessment: Needs assistance Sitting-balance support: No upper extremity supported;Feet supported;Single extremity supported Sitting balance-Leahy Scale: Poor Sitting balance - Comments: requires at least min assist posteriorly to maintain static sitting balance Postural control: Posterior lean     Standing balance comment: not assessed today                            Cognition Arousal/Alertness: Awake/alert Behavior During Therapy: Flat affect Overall Cognitive Status: Difficult to assess                                 General Comments: no command following, pt does smile at PT upon arrival to room.      Exercises General Exercises - Lower Extremity Heel Slides: PROM;Both;10 reps;Supine;Limitations Heel Slides Limitations: resistant and restless with RLE ROM, reaching LUE towards RLE during mobility attempts    General Comments        Pertinent Vitals/Pain Pain Assessment: Faces Faces Pain Scale: Hurts little more Pain Location: RLE, during ROM Pain Descriptors / Indicators: Grimacing;Restless Pain Intervention(s): Limited activity within patient's tolerance;Monitored during session;Repositioned;Relaxation    Home Living                      Prior Function  PT Goals (current goals can now be found in the care plan section) Acute Rehab PT Goals Patient Stated Goal: unable to state PT Goal Formulation: Patient unable to participate in goal setting Time For Goal Achievement: 06/03/20 Potential to Achieve Goals: Poor Progress towards PT goals: Progressing toward goals    Frequency    Min 3X/week      PT Plan Current plan remains appropriate    Co-evaluation   Reason for Co-Treatment: For patient/therapist safety;To address functional/ADL transfers;Necessary to address cognition/behavior during functional activity PT goals addressed  during session: Mobility/safety with mobility;Balance;Strengthening/ROM        AM-PAC PT "6 Clicks" Mobility   Outcome Measure  Help needed turning from your back to your side while in a flat bed without using bedrails?: Total Help needed moving from lying on your back to sitting on the side of a flat bed without using bedrails?: Total Help needed moving to and from a bed to a chair (including a wheelchair)?: Total Help needed standing up from a chair using your arms (e.g., wheelchair or bedside chair)?: Total Help needed to walk in hospital room?: Total Help needed climbing 3-5 steps with a railing? : Total 6 Click Score: 6    End of Session   Activity Tolerance: Treatment limited secondary to medical complications (Comment) (hypotension) Patient left: in bed;with bed alarm set;with call bell/phone within reach;with restraints reapplied;with SCD's reapplied (L handmitt) Nurse Communication: Mobility status;Other (comment) (BP response to activity) PT Visit Diagnosis: Unsteadiness on feet (R26.81);Muscle weakness (generalized) (M62.81);Difficulty in walking, not elsewhere classified (R26.2);Other symptoms and signs involving the nervous system (R29.898);Hemiplegia and hemiparesis Hemiplegia - Right/Left: Right Hemiplegia - dominant/non-dominant: Dominant Hemiplegia - caused by: Nontraumatic intracerebral hemorrhage     Time: 1233-1250 PT Time Calculation (min) (ACUTE ONLY): 17 min  Charges:  $Therapeutic Activity: 8-22 mins                     Laekyn Rayos E, PT Acute Rehabilitation Services Pager [phone]  Office [phone]     Ebb Carelock D Despina Hidden 05/25/2020, 1:27 PM

## 2020-05-26 LAB — CBC WITH DIFFERENTIAL/PLATELET
Abs Immature Granulocytes: 0.08 10*3/uL — ABNORMAL HIGH (ref 0.00–0.07)
Basophils Absolute: 0.1 10*3/uL (ref 0.0–0.1)
Basophils Relative: 0 %
Eosinophils Absolute: 0.2 10*3/uL (ref 0.0–0.5)
Eosinophils Relative: 2 %
HCT: 29.4 % — ABNORMAL LOW (ref 39.0–52.0)
Hemoglobin: 9.1 g/dL — ABNORMAL LOW (ref 13.0–17.0)
Immature Granulocytes: 1 %
Lymphocytes Relative: 11 %
Lymphs Abs: 1.3 10*3/uL (ref 0.7–4.0)
MCH: 30.1 pg (ref 26.0–34.0)
MCHC: 31 g/dL (ref 30.0–36.0)
MCV: 97.4 fL (ref 80.0–100.0)
Monocytes Absolute: 1.4 10*3/uL — ABNORMAL HIGH (ref 0.1–1.0)
Monocytes Relative: 12 %
Neutro Abs: 8.6 10*3/uL — ABNORMAL HIGH (ref 1.7–7.7)
Neutrophils Relative %: 74 %
Platelets: 242 10*3/uL (ref 150–400)
RBC: 3.02 MIL/uL — ABNORMAL LOW (ref 4.22–5.81)
RDW: 19.3 % — ABNORMAL HIGH (ref 11.5–15.5)
WBC: 11.6 10*3/uL — ABNORMAL HIGH (ref 4.0–10.5)
nRBC: 0 % (ref 0.0–0.2)

## 2020-05-26 LAB — GLUCOSE, CAPILLARY
Glucose-Capillary: 122 mg/dL — ABNORMAL HIGH (ref 70–99)
Glucose-Capillary: 123 mg/dL — ABNORMAL HIGH (ref 70–99)
Glucose-Capillary: 132 mg/dL — ABNORMAL HIGH (ref 70–99)
Glucose-Capillary: 133 mg/dL — ABNORMAL HIGH (ref 70–99)
Glucose-Capillary: 133 mg/dL — ABNORMAL HIGH (ref 70–99)
Glucose-Capillary: 140 mg/dL — ABNORMAL HIGH (ref 70–99)
Glucose-Capillary: 152 mg/dL — ABNORMAL HIGH (ref 70–99)

## 2020-05-26 LAB — COMPREHENSIVE METABOLIC PANEL
ALT: 140 U/L — ABNORMAL HIGH (ref 0–44)
AST: 134 U/L — ABNORMAL HIGH (ref 15–41)
Albumin: 2.3 g/dL — ABNORMAL LOW (ref 3.5–5.0)
Alkaline Phosphatase: 209 U/L — ABNORMAL HIGH (ref 38–126)
Anion gap: 8 (ref 5–15)
BUN: 76 mg/dL — ABNORMAL HIGH (ref 8–23)
CO2: 23 mmol/L (ref 22–32)
Calcium: 9 mg/dL (ref 8.9–10.3)
Chloride: 123 mmol/L — ABNORMAL HIGH (ref 98–111)
Creatinine, Ser: 1.61 mg/dL — ABNORMAL HIGH (ref 0.61–1.24)
GFR, Estimated: 47 mL/min — ABNORMAL LOW (ref >60)
Glucose, Bld: 139 mg/dL — ABNORMAL HIGH (ref 70–99)
Potassium: 4.4 mmol/L (ref 3.5–5.1)
Sodium: 154 mmol/L — ABNORMAL HIGH (ref 135–145)
Total Bilirubin: 0.7 mg/dL (ref 0.3–1.2)
Total Protein: 8.1 g/dL (ref 6.5–8.1)

## 2020-05-26 LAB — CBC
HCT: 28.8 % — ABNORMAL LOW (ref 39.0–52.0)
Hemoglobin: 8.6 g/dL — ABNORMAL LOW (ref 13.0–17.0)
MCH: 29.4 pg (ref 26.0–34.0)
MCHC: 29.9 g/dL — ABNORMAL LOW (ref 30.0–36.0)
MCV: 98.3 fL (ref 80.0–100.0)
Platelets: 252 10*3/uL (ref 150–400)
RBC: 2.93 MIL/uL — ABNORMAL LOW (ref 4.22–5.81)
RDW: 18.9 % — ABNORMAL HIGH (ref 11.5–15.5)
WBC: 10.8 10*3/uL — ABNORMAL HIGH (ref 4.0–10.5)
nRBC: 0.2 % (ref 0.0–0.2)

## 2020-05-26 MED ORDER — SODIUM CHLORIDE 0.9 % IV BOLUS
500.0000 mL | Freq: Once | INTRAVENOUS | Status: AC
  Administered 2020-05-26: 500 mL via INTRAVENOUS

## 2020-05-26 NOTE — Progress Notes (Addendum)
Still slightly hypotensive and tachycardic.  Will recheck CBC, stop metoprolol and give 500 cc normal saline bolus.

## 2020-05-26 NOTE — Progress Notes (Signed)
Occupational Therapy Treatment Patient Details Name: Alan Everett MRN: [id number] DOB: 07/19/1956 Today's Date: 05/26/2020    History of present illness Patient is a 64 y/o male with PMH ETOH use, tobacco abuse, admitted with R side weakness,sensory loss and speech deficits.  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   OT comments  Pt seen for co-treatment with PT. Pt continues to require totalA for bed mobility and for all ADL completion. Pt demonstrated progress with visual tracking to midline and to right visual field during this session. He demonstrated improvement with righting reactions sitting EOB with challenges to balance in left and right planes, did not demonstrate righting reaction with anterior/posterior challenge to posture. Pt demonstrated initiation of bringing LUE to face with LUE supported by therapist, he required extended time and increased effort. Pt will continue to benefit from skilled OT services to maximize safety and independence with ADL/IADL and functional mobility. Will continue to follow acutely and progress as tolerated.    Follow Up Recommendations  SNF    Equipment Recommendations  Wheelchair (measurements OT);Wheelchair cushion (measurements OT);Hospital bed    Recommendations for Other Services      Precautions / Restrictions Precautions Precautions: Fall Precaution Comments: L handmitt, R pushing Restrictions Weight Bearing Restrictions: No       Mobility Bed Mobility Overal bed mobility: Needs Assistance Bed Mobility: Supine to Sit;Sit to Supine Rolling: Total assist   Supine to sit: Total assist;+2 for physical assistance Sit to supine: Total assist;+2 for physical assistance   General bed mobility comments: totalA+2 for all aspects of bed mobility  Transfers                 General transfer comment: deferred due decr BP    Balance Overall balance assessment: Needs  assistance Sitting-balance support: No upper extremity supported;Feet supported;Single extremity supported Sitting balance-Leahy Scale: Poor Sitting balance - Comments: pt demontrated righting reaction with trunk challenges to left and right, does not appear to demonstrate protective response with anterior posterior challenges;                       High Level Balance Comments: performed seated postural exercises, weight bearing through RUE and LUE with assistance to progress trunk to upright posture           ADL either performed or assessed with clinical judgement   ADL Overall ADL's : Needs assistance/impaired Eating/Feeding: NPO   Grooming: Total assistance Grooming Details (indicate cue type and reason): pt initiating progressing LUE to face, required assistance for support at elbow and forearm, required extended time and effort to move UE                               General ADL Comments: pt is total (A) for all adls.     Vision   Vision Assessment?: Vision impaired- to be further tested in functional context Additional Comments: pt with intermittent visual tracking to midline, found therapist in right visual field x2 with visual attention <3seconds   Perception     Praxis      Cognition Arousal/Alertness: Awake/alert Behavior During Therapy: Flat affect Overall Cognitive Status: Difficult to assess                                 General Comments: pt knodded  head intermittently during session;pt appeared to attempt to initiate LUE to touch nose when prompted;pt with decreased attention span about 66min;intermittent eye contact with therapist toward end of session;continues to demonstrate right neglect;pt smiled during session knodded yes when asked if he enjoyed praise.         Exercises Other Exercises Other Exercises: PROM elbow wrist and digits LUE;unable to complete RUE secondary to pain Other Exercises: worked on passive  cervical rotation and lateral flexion to R   Shoulder Instructions       General Comments BP 87/52 HR 124 sitting EOB initially, after BP 105/67mmHg    Pertinent Vitals/ Pain       Pain Assessment: Faces Faces Pain Scale: Hurts little more Pain Location: RLE during ROM Pain Descriptors / Indicators: Grimacing;Restless Pain Intervention(s): Monitored during session;Limited activity within patient's tolerance  Home Living                                          Prior Functioning/Environment              Frequency  Min 2X/week        Progress Toward Goals  OT Goals(current goals can now be found in the care plan section)  Progress towards OT goals: Progressing toward goals  Acute Rehab OT Goals Patient Stated Goal: unable to state OT Goal Formulation: Patient unable to participate in goal setting Time For Goal Achievement: 06/01/20 Potential to Achieve Goals: Fair ADL Goals Pt Will Perform Grooming: with min assist;sitting Pt Will Transfer to Toilet: with max assist;with transfer board;bedside commode Pt/caregiver will Perform Home Exercise Program:  (Pt will tolerate soft elbow splint on RUE to help with tone) Additional ADL Goal #1: pt will look at object in midline 50% of session Additional ADL Goal #2: Pt will be able to track right and left 5 times in succesion with increased time Additional ADL Goal #3: Pt will follow 3 out of 5 one step commands Additional ADL Goal #4: pt will sit EOB min (A) as prep for transfers  Plan Discharge plan remains appropriate    Co-evaluation    PT/OT/SLP Co-Evaluation/Treatment: Yes Reason for Co-Treatment: For patient/therapist safety;To address functional/ADL transfers   OT goals addressed during session: ADL's and self-care      AM-PAC OT "6 Clicks" Daily Activity     Outcome Measure   Help from another person eating meals?: Total Help from another person taking care of personal grooming?:  Total Help from another person toileting, which includes using toliet, bedpan, or urinal?: Total Help from another person bathing (including washing, rinsing, drying)?: Total Help from another person to put on and taking off regular upper body clothing?: Total Help from another person to put on and taking off regular lower body clothing?: Total 6 Click Score: 6    End of Session    OT Visit Diagnosis: Other abnormalities of gait and mobility (R26.89);Muscle weakness (generalized) (M62.81);Low vision, both eyes (H54.2);Other symptoms and signs involving cognitive function;Hemiplegia and hemiparesis Hemiplegia - Right/Left: Right Hemiplegia - dominant/non-dominant: Dominant   Activity Tolerance Patient tolerated treatment well   Patient Left in bed;with call bell/phone within reach;with bed alarm set;with restraints reapplied;with SCD's reapplied   Nurse Communication Mobility status;Precautions        Time: 1093-2355 OT Time Calculation (min): 26 min  Charges: OT General Charges $OT Visit: 1 Visit OT Treatments $Self  Care/Home Management : 8-22 mins  Rosey Bath OTR/L Acute Rehabilitation Services Office: [phone]    Rebeca Alert 05/26/2020, 1:20 PM

## 2020-05-26 NOTE — Progress Notes (Signed)
PROGRESS NOTE    Alan Everett  XVQ:[id number] DOB: 1956-06-13 DOA: 05/05/2020 PCP: Patient, No Pcp Per   Brief Narrative:  64 y.o.malewith a PMHx of alcohol use and tobacco abuse who presented to the ED via EMS as a Code Stroke for acute onset of right sided weakness. he also had sensory loss and weakness involving his RUE and RLE. 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 Neurosurgery was consulted for EVD consideration which was then deferred.  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  Transferred under [place] on 05/09/2020.  On 05/08/2020, patient spiked fever and he was started on broad-spectrum antibiotics/IV Zosyn empirically.  Despite of antibiotics, patient's white blood cells as well as fever continue to get worse with a T-max of 103.2 at around noon on 05/11/2020.  Previous hospitalist Dr. Shelton Silvas had discussed case with ID who recommended scanning his chest and abdomen and continuing antibiotics.  CT abdomen chest and pelvis shows bibasilar infiltrates suspecting possible pneumonia but no other pathology. 2D EchoEF 60-65%. UDS positive for cocaine.  Hospital course complicated by hemoglobin dropping down to 5.5.   Assessment & Plan:   Active Problems:   ICH (intracerebral hemorrhage) (HCC)   Polysubstance abuse (HCC)   Dysphagia, post-stroke   Hyperglycemia   Hypokalemia   Hypomagnesemia   Fever   SOB (shortness of breath)   Leukocytosis   Aspiration pneumonia of both lower lobes ([facility])   Palliative care by specialist   Goals of care, counseling/discussion   DNR (do not resuscitate) discussion   Protein-calorie malnutrition, severe    Normocytic Anemia, unclear etiology; acutely worsening -Hemoglobin dropped down to 5.5 10/26.  Improved after transfusion. -Unable to perform review endoscopy due to recent CVA/dysphagia.  High risk for endoscopy  procedure.  Previous hospitalist discussed with GI, Dr. Hilarie Fredrickson.   Acute Hemorrhagic Stroke/cerebral edema -Likely in the setting of severe hypertension, alcohol and cocaine use. -Previously was discussed with neurosurgery and neurology. -Echocardiogram EF 60 to 65% -UDS positive for cocaine.  Essential hypertension -Borderline low blood pressure-discontinue lisinopril and Norvasc  Hyperlipidemia -LDL less than 70  Mildly elevated AST/ALT/Alk phos/Hypernatremia, resolving Hypophosphatemia/hypokalemia, monitor -Labs ordered for today.  Dysphagia -PEG tube placed on 05/21/2020. -Monitor for aspiration.  Follow fever curve.  Tobacco Abuse -Counseled to quit using this  Cocaine abuse -Counseled to quit using this  Sepsis in the setting of Aspiration Pneumonia, likely POA, resolved  - Likely in the setting of aspiration pneumonia. - CT chest shows bibasilar pneumonia.  CT abdomen pelvis negative.  -Previously treated for aspiration pneumonia with antibiotics.  Currently off antibiotics.  GERD -PPI twice daily  DVT prophylaxis: SCD's Start: 05/05/20 1938 Code Status: Full code Family Communication:    Status is: Inpatient  Remains inpatient appropriate because:Inpatient level of care appropriate due to severity of illness   Dispo: The patient is from: Home              Anticipated d/c is to: SNF              Anticipated d/c date is: 2 days              Patient currently is not medically stable to d/c.  Still confused, need to closely monitor his mental status.  I anticipate he will be a difficult placement given his ongoing issues, lack of insurance.   Body mass index is 21.63 kg/m.  Subjective: Patient lying in the bed does not participate in any meaningful conversation.  Does not appear to be in any acute distress.  He does have mittens in place and attempting to get out of it.  Review of Systems Otherwise negative except as per HPI,  including: Difficult to obtain  Examination:  Constitutional: Not in acute distress, currently remains in mittens Respiratory: Clear to auscultation bilaterally Cardiovascular: Normal sinus rhythm, no rubs Abdomen: Nontender nondistended good bowel sounds Musculoskeletal: No edema noted Skin: No rashes seen Neurologic: CN 2-12 grossly intact.  And nonfocal Psychiatric: Poor judgment and insight  Condom catheter in place  Objective: Vitals:   05/26/20 0014 05/26/20 0417 05/26/20 0429 05/26/20 0747  BP: 116/65 (!) 93/59  (!) 97/58  Pulse: (!) 105 100  88  Resp: 19 18  17   Temp: 100 F (37.8 C) 97.8 F (36.6 C)  (!) 97.2 F (36.2 C)  TempSrc: Oral Oral  Oral  SpO2: 99%   99%  Weight:   60.8 kg     Intake/Output Summary (Last 24 hours) at 05/26/2020 0821 Last data filed at 05/26/2020 0417 Gross per 24 hour  Intake 4424 ml  Output 2000 ml  Net 2424 ml   Filed Weights   05/23/20 0545 05/25/20 0352 05/26/20 0429  Weight: 60.7 kg 60.8 kg 60.8 kg     Data Reviewed:   CBC: Recent Labs  Lab 05/22/20 0226 05/22/20 0226 05/23/20 0244 05/24/20 0601 05/25/20 0517 05/25/20 0851 05/26/20 0416  WBC 13.5*  --  11.6* 13.2* 10.6*  --  11.6*  NEUTROABS 10.8*  --  9.0* 10.7* 8.2*  --  8.6*  HGB 7.6*   < > 7.2* 7.7* 5.5* 5.8* 9.1*  HCT 26.3*   < > 25.4* 26.6* 19.5* 20.7* 29.4*  MCV 101.5*  --  105.4* 103.9* 104.8*  --  97.4  PLT 319  --  258 242 244  --  242   < > = values in this interval not displayed.   Basic Metabolic Panel: Recent Labs  Lab 05/21/20 0148 05/22/20 0226 05/23/20 0244 05/24/20 0601 05/25/20 0517  NA 153* 151* 150* 150* 153*  K 4.0 4.3 4.5 4.7 4.6  CL 120* 120* 119* 117* 120*  CO2 25 23 21* 22 22  GLUCOSE 93 152* 148* 134* 140*  BUN 52* 67* 84* 74* 84*  CREATININE 1.33* 1.75* 1.67* 1.55* 1.71*  CALCIUM 9.3 9.3 9.0 9.2 8.8*   GFR: Estimated Creatinine Clearance: 37.5 mL/min (A) (by C-G formula based on SCr of 1.71 mg/dL (H)). Liver Function  Tests: Recent Labs  Lab 05/21/20 0148 05/22/20 0226 05/23/20 0244 05/24/20 0601 05/25/20 0517  AST 146* 91* 99* 122* 98*  ALT 166* 129* 124* 147* 114*  ALKPHOS 212* 187* 181* 215* 185*  BILITOT 0.5 0.7 0.4 0.6 0.5  PROT 7.9 8.0 7.5 8.2* 7.1  ALBUMIN 2.4* 2.3* 2.3* 2.4* 2.0*   No results for input(s): LIPASE, AMYLASE in the last 168 hours. No results for input(s): AMMONIA in the last 168 hours. Coagulation Profile: Recent Labs  Lab 05/20/20 0248  INR 1.1   Cardiac Enzymes: No results for input(s): CKTOTAL, CKMB, CKMBINDEX, TROPONINI in the last 168 hours. BNP (last 3 results) No results for input(s): PROBNP in the last 8760 hours. HbA1C: No results for input(s): HGBA1C in the last 72 hours. CBG: Recent Labs  Lab 05/25/20 1149 05/25/20 1612 05/25/20 1959 05/26/20 0012 05/26/20 0411  GLUCAP 116* 125* 130* 132* 133*   Lipid Profile: No  results for input(s): CHOL, HDL, LDLCALC, TRIG, CHOLHDL, LDLDIRECT in the last 72 hours. Thyroid Function Tests: No results for input(s): TSH, T4TOTAL, FREET4, T3FREE, THYROIDAB in the last 72 hours. Anemia Panel: No results for input(s): VITAMINB12, FOLATE, FERRITIN, TIBC, IRON, RETICCTPCT in the last 72 hours. Sepsis Labs: No results for input(s): PROCALCITON, LATICACIDVEN in the last 168 hours.  Recent Results (from the past 240 hour(s))  Surgical pcr screen     Status: None   Collection Time: 05/20/20  3:00 AM   Specimen: Nasal Mucosa; Nasal Swab  Result Value Ref Range Status   MRSA, PCR NEGATIVE NEGATIVE Final   Staphylococcus aureus NEGATIVE NEGATIVE Final    Comment: (NOTE) The Xpert SA Assay (FDA approved for NASAL specimens in patients 76 years of age and older), is one component of a comprehensive surveillance program. It is not intended to diagnose infection nor to guide or monitor treatment. Performed at [facility], [place] [address] [place], [place] [address]   Culture, blood (routine x 2)     Status: None  (Preliminary result)   Collection Time: 05/24/20 10:08 AM   Specimen: BLOOD  Result Value Ref Range Status   Specimen Description BLOOD RIGHT ANTECUBITAL  Final   Special Requests   Final    BOTTLES DRAWN AEROBIC ONLY Blood Culture results may not be optimal due to an inadequate volume of blood received in culture bottles   Culture   Final    NO GROWTH 2 DAYS Performed at [facility], [place] [address], [place], [place] [address]    Report Status PENDING  Incomplete  Culture, blood (routine x 2)     Status: None (Preliminary result)   Collection Time: 05/24/20 10:14 AM   Specimen: BLOOD  Result Value Ref Range Status   Specimen Description BLOOD RIGHT ANTECUBITAL  Final   Special Requests   Final    BOTTLES DRAWN AEROBIC AND ANAEROBIC Blood Culture adequate volume   Culture   Final    NO GROWTH 2 DAYS Performed at [facility], [place] [address] [place], [place] [address]    Report Status PENDING  Incomplete         Radiology Studies: No results found.      Scheduled Meds: . sodium chloride   Intravenous Once  . amLODipine  10 mg Per Tube Daily  . chlorhexidine  15 mL Mouth Rinse BID  . free water  200 mL Per Tube Q6H  . insulin aspart  0-9 Units Subcutaneous Q4H  . insulin glargine  15 Units Subcutaneous Daily  . mouth rinse  15 mL Mouth Rinse q12n4p  . metoprolol tartrate  25 mg Per Tube BID  . pantoprazole sodium  40 mg Per Tube BID  . senna-docusate  1 tablet Per Tube BID  . sodium chloride flush  10-40 mL Intracatheter Q12H   Continuous Infusions: . feeding supplement (OSMOLITE 1.2 CAL) 1,000 mL (05/25/20 2139)     LOS: 21 days   Time spent= 35 mins    Kamdin Follett Arsenio Loader, MD [facility]  If 7PM-7AM, please contact night-coverage  05/26/2020, 8:21 AM

## 2020-05-26 NOTE — Progress Notes (Signed)
Physical Therapy Treatment Patient Details Name: Alan Everett MRN: [id number] DOB: 03-Jun-1956 Today's Date: 05/26/2020    History of Present Illness Patient is a 64 y/o male with PMH ETOH use, tobacco abuse, admitted with R side weakness,sensory loss and speech deficits.  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    PT Comments    Pt hypotensive during mobility, but pt alert for session with no s/s of dizziness. Pt attempting to follow short, one-step commands today, for example "touch your nose" achieved with pt initiating movement and PT holding LUE in gravity-eliminated position. Pt requires max multimodal cuing for all command following. Pt tolerated EOB sitting balance tasks well, sitting EOB x10 minutes with posterior support. Pt continues to require total assist for mobility tasks.   BP 87/52 HR 124 sitting EOB initially, after BP 105/56   Follow Up Recommendations  SNF;Supervision/Assistance - 24 hour     Equipment Recommendations  Wheelchair cushion (measurements PT);Wheelchair (measurements PT);Hospital bed;Other (comment)    Recommendations for Other Services       Precautions / Restrictions Precautions Precautions: Fall Precaution Comments: L handmitt, R pushing Restrictions Weight Bearing Restrictions: No    Mobility  Bed Mobility Overal bed mobility: Needs Assistance Bed Mobility: Supine to Sit;Sit to Supine Rolling: Total assist   Supine to sit: Total assist;+2 for physical assistance;HOB elevated Sit to supine: Total assist;+2 for physical assistance;HOB elevated   General bed mobility comments: totalA+2 for all aspects of bed mobility, initially resistant to mobility but more cooperative when moving from sidelying>sit.  Transfers                 General transfer comment: deferred due decr BP  Ambulation/Gait                 Stairs             Wheelchair  Mobility    Modified Rankin (Stroke Patients Only) Modified Rankin (Stroke Patients Only) Pre-Morbid Rankin Score: No symptoms Modified Rankin: Severe disability     Balance Overall balance assessment: Needs assistance Sitting-balance support: No upper extremity supported;Feet supported;Single extremity supported Sitting balance-Leahy Scale: Poor Sitting balance - Comments: pt demontrated righting reaction with trunk challenges to left and right, does not appear to demonstrate protective response with anterior posterior challenges. EOB sitting activity x10 minutes Postural control: Posterior lean                     High Level Balance Comments: performed seated postural exercises, weight bearing through RUE and LUE with assistance to progress trunk to upright posture            Cognition Arousal/Alertness: Awake/alert Behavior During Therapy: Flat affect Overall Cognitive Status: Difficult to assess                                 General Comments: pt nodded head intermittently during session;pt appeared to attempt to initiate LUE to touch nose when prompted;pt with decreased attention span about 25min;intermittent eye contact with therapist toward end of session;continues to demonstrate right neglect;pt smiled during session nodded yes when asked if he enjoyed praise.      Exercises Other Exercises Other Exercises: Postural exercise: elbow propping to upright, performed bilaterally. PT facilitation at hip girdle and elbow when moving back to upright. x10 each direction. Other Exercises: PROM elbow wrist and digits LUE;unable to  complete RUE secondary to pain Other Exercises: worked on passive cervical rotation and lateral flexion to R    General Comments General comments (skin integrity, edema, etc.): BP 87/52 HR 124 sitting EOB initially, after BP 105/32mmHg      Pertinent Vitals/Pain Pain Assessment: Faces Faces Pain Scale: Hurts little  more Pain Location: RLE during ROM Pain Descriptors / Indicators: Grimacing;Restless Pain Intervention(s): Limited activity within patient's tolerance;Monitored during session;Repositioned    Home Living                      Prior Function            PT Goals (current goals can now be found in the care plan section) Acute Rehab PT Goals Patient Stated Goal: unable to state PT Goal Formulation: Patient unable to participate in goal setting Time For Goal Achievement: 06/03/20 Potential to Achieve Goals: Poor Progress towards PT goals: Progressing toward goals    Frequency    Min 3X/week      PT Plan Current plan remains appropriate    Co-evaluation PT/OT/SLP Co-Evaluation/Treatment: Yes Reason for Co-Treatment: For patient/therapist safety;To address functional/ADL transfers;Necessary to address cognition/behavior during functional activity;Complexity of the patient's impairments (multi-system involvement) PT goals addressed during session: Mobility/safety with mobility;Balance OT goals addressed during session: ADL's and self-care      AM-PAC PT "6 Clicks" Mobility   Outcome Measure  Help needed turning from your back to your side while in a flat bed without using bedrails?: Total Help needed moving from lying on your back to sitting on the side of a flat bed without using bedrails?: Total Help needed moving to and from a bed to a chair (including a wheelchair)?: Total Help needed standing up from a chair using your arms (e.g., wheelchair or bedside chair)?: Total Help needed to walk in hospital room?: Total Help needed climbing 3-5 steps with a railing? : Total 6 Click Score: 6    End of Session   Activity Tolerance: Treatment limited secondary to medical complications (Comment) (hypotension) Patient left: in bed;with bed alarm set;with call bell/phone within reach;with SCD's reapplied (bilat hand mitts) Nurse Communication: Mobility status PT Visit  Diagnosis: Unsteadiness on feet (R26.81);Muscle weakness (generalized) (M62.81);Difficulty in walking, not elsewhere classified (R26.2);Other symptoms and signs involving the nervous system (R29.898);Hemiplegia and hemiparesis Hemiplegia - Right/Left: Right Hemiplegia - dominant/non-dominant: Dominant Hemiplegia - caused by: Nontraumatic intracerebral hemorrhage     Time: 1203-1227 PT Time Calculation (min) (ACUTE ONLY): 24 min  Charges:  $Neuromuscular Re-education: 8-22 mins                     Deedee Lybarger E, PT Acute Rehabilitation Services Pager [phone]  Office [phone]     Tyrone Apple D Despina Hidden 05/26/2020, 3:33 PM

## 2020-05-27 ENCOUNTER — Inpatient Hospital Stay (HOSPITAL_COMMUNITY): Payer: Medicaid Other

## 2020-05-27 DIAGNOSIS — G9341 Metabolic encephalopathy: Secondary | ICD-10-CM

## 2020-05-27 LAB — CBC WITH DIFFERENTIAL/PLATELET
Abs Immature Granulocytes: 0.06 10*3/uL (ref 0.00–0.07)
Basophils Absolute: 0.1 10*3/uL (ref 0.0–0.1)
Basophils Relative: 1 %
Eosinophils Absolute: 0.2 10*3/uL (ref 0.0–0.5)
Eosinophils Relative: 2 %
HCT: 25.6 % — ABNORMAL LOW (ref 39.0–52.0)
Hemoglobin: 7.6 g/dL — ABNORMAL LOW (ref 13.0–17.0)
Immature Granulocytes: 1 %
Lymphocytes Relative: 7 %
Lymphs Abs: 0.8 10*3/uL (ref 0.7–4.0)
MCH: 29.9 pg (ref 26.0–34.0)
MCHC: 29.7 g/dL — ABNORMAL LOW (ref 30.0–36.0)
MCV: 100.8 fL — ABNORMAL HIGH (ref 80.0–100.0)
Monocytes Absolute: 1.1 10*3/uL — ABNORMAL HIGH (ref 0.1–1.0)
Monocytes Relative: 11 %
Neutro Abs: 8.2 10*3/uL — ABNORMAL HIGH (ref 1.7–7.7)
Neutrophils Relative %: 78 %
Platelets: 215 10*3/uL (ref 150–400)
RBC: 2.54 MIL/uL — ABNORMAL LOW (ref 4.22–5.81)
RDW: 18.6 % — ABNORMAL HIGH (ref 11.5–15.5)
WBC: 10.3 10*3/uL (ref 4.0–10.5)
nRBC: 0 % (ref 0.0–0.2)

## 2020-05-27 LAB — URINALYSIS, ROUTINE W REFLEX MICROSCOPIC
Bilirubin Urine: NEGATIVE
Glucose, UA: NEGATIVE mg/dL
Hgb urine dipstick: NEGATIVE
Ketones, ur: NEGATIVE mg/dL
Nitrite: NEGATIVE
Protein, ur: NEGATIVE mg/dL
Specific Gravity, Urine: 1.016 (ref 1.005–1.030)
pH: 6 (ref 5.0–8.0)

## 2020-05-27 LAB — GLUCOSE, CAPILLARY
Glucose-Capillary: 125 mg/dL — ABNORMAL HIGH (ref 70–99)
Glucose-Capillary: 121 mg/dL — ABNORMAL HIGH (ref 70–99)
Glucose-Capillary: 105 mg/dL — ABNORMAL HIGH (ref 70–99)
Glucose-Capillary: 96 mg/dL (ref 70–99)

## 2020-05-27 LAB — COMPREHENSIVE METABOLIC PANEL
ALT: 121 U/L — ABNORMAL HIGH (ref 0–44)
AST: 117 U/L — ABNORMAL HIGH (ref 15–41)
Albumin: 1.9 g/dL — ABNORMAL LOW (ref 3.5–5.0)
Alkaline Phosphatase: 181 U/L — ABNORMAL HIGH (ref 38–126)
Anion gap: 8 (ref 5–15)
BUN: 65 mg/dL — ABNORMAL HIGH (ref 8–23)
CO2: 23 mmol/L (ref 22–32)
Calcium: 8.7 mg/dL — ABNORMAL LOW (ref 8.9–10.3)
Chloride: 125 mmol/L — ABNORMAL HIGH (ref 98–111)
Creatinine, Ser: 1.49 mg/dL — ABNORMAL HIGH (ref 0.61–1.24)
GFR, Estimated: 52 mL/min — ABNORMAL LOW (ref >60)
Glucose, Bld: 126 mg/dL — ABNORMAL HIGH (ref 70–99)
Potassium: 4.6 mmol/L (ref 3.5–5.1)
Sodium: 156 mmol/L — ABNORMAL HIGH (ref 135–145)
Total Bilirubin: 0.7 mg/dL (ref 0.3–1.2)
Total Protein: 7.2 g/dL (ref 6.5–8.1)

## 2020-05-27 LAB — PROCALCITONIN: Procalcitonin: 0.3 ng/mL

## 2020-05-27 LAB — VITAMIN B12: Vitamin B-12: 690 pg/mL (ref 180–914)

## 2020-05-27 LAB — MAGNESIUM: Magnesium: 2.7 mg/dL — ABNORMAL HIGH (ref 1.7–2.4)

## 2020-05-27 LAB — TSH: TSH: 1.388 u[IU]/mL (ref 0.350–4.500)

## 2020-05-27 MED ORDER — FREE WATER
200.0000 mL | Status: DC
  Administered 2020-05-27 – 2020-07-16 (×292): 200 mL

## 2020-05-27 MED ORDER — IOHEXOL 9 MG/ML PO SOLN
500.0000 mL | ORAL | Status: AC
  Administered 2020-05-27 (×2): 500 mL via ORAL

## 2020-05-27 NOTE — Progress Notes (Signed)
Patient's tube feeds were turned off at approximately 0900 for abdominal u/s scheduled for late afternoon.

## 2020-05-27 NOTE — Progress Notes (Signed)
PROGRESS NOTE    Alan Everett  UDJ:[id number] DOB: [birth year] DOA: 05/05/2020 PCP: Patient, No Pcp Per   Brief Narrative:  64 y.o.malewith a PMHx of alcohol use and tobacco abuse who presented to the ED via EMS as a Code Stroke for acute onset of right sided weakness. he also had sensory loss and weakness involving his RUE and RLE. 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 Neurosurgery was consulted for EVD consideration which was then deferred.  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  Transferred under [place] on 05/09/2020.  On 05/08/2020, patient spiked fever and he was started on broad-spectrum antibiotics/IV Zosyn empirically.  Despite of antibiotics, patient's white blood cells as well as fever continue to get worse with a T-max of 103.2 at around noon on 05/11/2020.  Previous hospitalist Dr. Shelton Silvas had discussed case with ID who recommended scanning his chest and abdomen and continuing antibiotics.  CT abdomen chest and pelvis shows bibasilar infiltrates suspecting possible pneumonia but no other pathology. 2D EchoEF 60-65%. UDS positive for cocaine.  Hospital course complicated by hemoglobin dropping down to 5.5.   Assessment & Plan:   Active Problems:   ICH (intracerebral hemorrhage) (HCC)   Polysubstance abuse (HCC)   Dysphagia, post-stroke   Hyperglycemia   Hypokalemia   Hypomagnesemia   Fever   SOB (shortness of breath)   Leukocytosis   Aspiration pneumonia of both lower lobes ([place])   Palliative care by specialist   Goals of care, counseling/discussion   DNR (do not resuscitate) discussion   Protein-calorie malnutrition, severe    Macrocytic anemia, unclear etiology; acutely worsening -Hemoglobin dropped down to 5.5 10/26.  Improved after transfusion.  But now drifting down again -CT abdomen pelvis without contrast -Unable to perform review  endoscopy due to recent CVA/dysphagia.  High risk for endoscopy procedure.  Previous hospitalist discussed with GI, Dr. Hilarie Fredrickson. -TSH, B12-normal  Acute Hemorrhagic Stroke/cerebral edema -Likely in the setting of severe hypertension, alcohol and cocaine use. -Previously was discussed with neurosurgery and neurology. -Echocardiogram EF 60 to 65% -UDS positive for cocaine.  Essential hypertension -Borderline low blood pressure-discontinue lisinopril and Norvasc  Hyperlipidemia -LDL less than 70  Mildly elevated AST/ALT/Alk phos/Hypernatremia, persist Hypophosphatemia/hypokalemia, monitor -Right upper quadrant ultrasound ordered -Increase frequency of free water flushes to every 4 hours  Dysphagia -PEG tube placed on 05/21/2020. -Monitor for aspiration-currently does not have any cough  Tobacco Abuse -Counseled to quit using this  Cocaine abuse -Counseled to quit using this  Sepsis in the setting of Aspiration Pneumonia, likely POA, resolved Fever, recurrent of unknown origin - Likely in the setting of aspiration pneumonia. - CT chest shows bibasilar pneumonia.  CT abdomen pelvis negative.  -Previously treated for aspiration pneumonia with antibiotics.  Currently off antibiotics. -Repeat UA, pro-Cal  GERD -PPI twice daily  DVT prophylaxis: SCD's Start: 05/05/20 1938 Code Status: Full code Family Communication: Long-term partner at bedside  Status is: Inpatient  Remains inpatient appropriate because:Inpatient level of care appropriate due to severity of illness   Dispo: The patient is from: Home              Anticipated d/c is to: SNF              Anticipated d/c date is: 2 days              Patient currently is not medically stable to d/c.  Still  confused, need to closely monitor his mental status.  I anticipate he will be a difficult placement given his ongoing issues, lack of insurance.   Body mass index is 22.56 kg/m.     Subjective: Patient laying  in the bed with his partner at bedside.  No meaningful interaction but grossly moving all of his extremities.  Review of Systems Otherwise negative except as per HPI, including: Difficult to obtain  Examination:  Constitutional: Not in acute distress, appears chronically ill Respiratory: Clear to auscultation bilaterally Cardiovascular: Normal sinus rhythm, no rubs Abdomen: Nontender nondistended good bowel sounds Musculoskeletal: No edema noted Skin: No rashes seen Neurologic: Difficult to assess but grossly moving all the extremities Psychiatric: Poor judgment and insight  PEG tube in place Condom catheter in place  Objective: Vitals:   05/27/20 0407 05/27/20 0736 05/27/20 0800 05/27/20 0900  BP:  (!) 113/56 98/72 (!) 125/55  Pulse:  (!) 122 (!) 108 (!) 120  Resp:  19 19 18   Temp:  99.8 F (37.7 C) (!) 100.9 F (38.3 C) 98.6 F (37 C)  TempSrc:  Axillary Axillary Axillary  SpO2:  100% 100% 100%  Weight: 63.4 kg       Intake/Output Summary (Last 24 hours) at 05/27/2020 1047 Last data filed at 05/27/2020 0751 Gross per 24 hour  Intake 10139 ml  Output 3000 ml  Net 7139 ml   Filed Weights   05/25/20 0352 05/26/20 0429 05/27/20 0407  Weight: 60.8 kg 60.8 kg 63.4 kg     Data Reviewed:   CBC: Recent Labs  Lab 05/23/20 0244 05/23/20 0244 05/24/20 0601 05/24/20 0601 05/25/20 0517 05/25/20 0851 05/26/20 0416 05/26/20 1422 05/27/20 0354  WBC 11.6*   < > 13.2*  --  10.6*  --  11.6* 10.8* 10.3  NEUTROABS 9.0*  --  10.7*  --  8.2*  --  8.6*  --  8.2*  HGB 7.2*   < > 7.7*   < > 5.5* 5.8* 9.1* 8.6* 7.6*  HCT 25.4*   < > 26.6*   < > 19.5* 20.7* 29.4* 28.8* 25.6*  MCV 105.4*   < > 103.9*  --  104.8*  --  97.4 98.3 100.8*  PLT 258   < > 242  --  244  --  242 252 215   < > = values in this interval not displayed.   Basic Metabolic Panel: Recent Labs  Lab 05/23/20 0244 05/24/20 0601 05/25/20 0517 05/26/20 1025 05/27/20 0354  NA 150* 150* 153* 154* 156*  K  4.5 4.7 4.6 4.4 4.6  CL 119* 117* 120* 123* 125*  CO2 21* 22 22 23 23   GLUCOSE 148* 134* 140* 139* 126*  BUN 84* 74* 84* 76* 65*  CREATININE 1.67* 1.55* 1.71* 1.61* 1.49*  CALCIUM 9.0 9.2 8.8* 9.0 8.7*  MG  --   --   --   --  2.7*   GFR: Estimated Creatinine Clearance: 44.9 mL/min (A) (by C-G formula based on SCr of 1.49 mg/dL (H)). Liver Function Tests: Recent Labs  Lab 05/23/20 0244 05/24/20 0601 05/25/20 0517 05/26/20 1025 05/27/20 0354  AST 99* 122* 98* 134* 117*  ALT 124* 147* 114* 140* 121*  ALKPHOS 181* 215* 185* 209* 181*  BILITOT 0.4 0.6 0.5 0.7 0.7  PROT 7.5 8.2* 7.1 8.1 7.2  ALBUMIN 2.3* 2.4* 2.0* 2.3* 1.9*   No results for input(s): LIPASE, AMYLASE in the last 168 hours. No results for input(s): AMMONIA in the last 168 hours. Coagulation Profile:  No results for input(s): INR, PROTIME in the last 168 hours. Cardiac Enzymes: No results for input(s): CKTOTAL, CKMB, CKMBINDEX, TROPONINI in the last 168 hours. BNP (last 3 results) No results for input(s): PROBNP in the last 8760 hours. HbA1C: No results for input(s): HGBA1C in the last 72 hours. CBG: Recent Labs  Lab 05/26/20 1542 05/26/20 1946 05/26/20 2326 05/27/20 0337 05/27/20 0814  GLUCAP 123* 133* 140* 125* 121*   Lipid Profile: No results for input(s): CHOL, HDL, LDLCALC, TRIG, CHOLHDL, LDLDIRECT in the last 72 hours. Thyroid Function Tests: Recent Labs    05/27/20 0843  TSH 1.388   Anemia Panel: Recent Labs    05/27/20 0843  VITAMINB12 690   Sepsis Labs: Recent Labs  Lab 05/27/20 0843  PROCALCITON 0.30    Recent Results (from the past 240 hour(s))  Surgical pcr screen     Status: None   Collection Time: 05/20/20  3:00 AM   Specimen: Nasal Mucosa; Nasal Swab  Result Value Ref Range Status   MRSA, PCR NEGATIVE NEGATIVE Final   Staphylococcus aureus NEGATIVE NEGATIVE Final    Comment: (NOTE) The Xpert SA Assay (FDA approved for NASAL specimens in patients 73 years of age and  older), is one component of a comprehensive surveillance program. It is not intended to diagnose infection nor to guide or monitor treatment. Performed at [facility], [place] [address], [place], [place] [address]   Culture, blood (routine x 2)     Status: None (Preliminary result)   Collection Time: 05/24/20 10:08 AM   Specimen: BLOOD  Result Value Ref Range Status   Specimen Description BLOOD RIGHT ANTECUBITAL  Final   Special Requests   Final    BOTTLES DRAWN AEROBIC ONLY Blood Culture results may not be optimal due to an inadequate volume of blood received in culture bottles   Culture   Final    NO GROWTH 3 DAYS Performed at [facility], [place] [address], [place], [place] [address]    Report Status PENDING  Incomplete  Culture, blood (routine x 2)     Status: None (Preliminary result)   Collection Time: 05/24/20 10:14 AM   Specimen: BLOOD  Result Value Ref Range Status   Specimen Description BLOOD RIGHT ANTECUBITAL  Final   Special Requests   Final    BOTTLES DRAWN AEROBIC AND ANAEROBIC Blood Culture adequate volume   Culture   Final    NO GROWTH 3 DAYS Performed at [facility], [place] [address], [place], [place] [address]    Report Status PENDING  Incomplete         Radiology Studies: No results found.      Scheduled Meds: . chlorhexidine  15 mL Mouth Rinse BID  . free water  200 mL Per Tube Q4H  . insulin aspart  0-9 Units Subcutaneous Q4H  . insulin glargine  15 Units Subcutaneous Daily  . iohexol  500 mL Oral Q1H  . mouth rinse  15 mL Mouth Rinse q12n4p  . pantoprazole sodium  40 mg Per Tube BID  . senna-docusate  1 tablet Per Tube BID  . sodium chloride flush  10-40 mL Intracatheter Q12H   Continuous Infusions: . feeding supplement (OSMOLITE 1.2 CAL) Stopped (05/27/20 1000)     LOS: 22 days   Time spent= 35 mins    Blaire Palomino Arsenio Loader, MD [facility]  If 7PM-7AM, please contact night-coverage  05/27/2020, 10:47 AM

## 2020-05-27 NOTE — Progress Notes (Signed)
  Speech Language Pathology Treatment: Dysphagia  Patient Details Name: Alan Everett MRN: [id number] DOB: 09-12-1955 Today's Date: 05/27/2020 Time: 3646-8032 SLP Time Calculation (min) (ACUTE ONLY): 25 min  Assessment / Plan / Recommendation Clinical Impression  Pt was seen for treatment. He was asleep upon SLP's arrival and required moderate verbal and tactile stimulation to demonstrate and mantain an adequate level of alertness. Oral care was limited due to pt's reduced cooperation. Pt initially demonstrated very poor bolus awareness and absent bolus manipulation with puree and the first bolus was removed from the oral cavity. Following additional tactile stimulation with oral swab, pt demonstrated improved awareness and tolerated puree solids, honey thick liquids via tsp and cup, and ice chips without overt s/sx of aspiration. Bolus formation appeared prolonged and secondary swallows were intermittently noted. A repeat modified barium swallow study will be conducted in the near future if pt is at least able to maintain this level of performance.    HPI HPI: Alan Everett is a 64 y.o. male with a PMHx of alcohol use and tobacco abuse who presents acutely to the ED via EMS as a Code Stroke for acute onset of right sided weakness. Patient with disoriented, right leg weakness, right limb ataxia, right decreased sensation and Global aphasia on exam. 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 Worsening of intraventricular extension.  MBS 10/19 mod-severe dysphagia, continue NPO.       SLP Plan  Continue with current plan of care (MBS if pt maintains today's performance level)       Recommendations  Diet recommendations: NPO;Other(comment) Medication Administration: Via alternative means                Oral Care Recommendations: Oral care QID Follow up Recommendations: Skilled Nursing facility SLP Visit Diagnosis: Dysphagia, oropharyngeal phase (R13.12) Plan: Continue with  current plan of care (MBS if pt maintains today's performance level)       Donyelle Enyeart I. Vear Clock, MS, CCC-SLP Acute Rehabilitation Services Office number [phone] Pager [phone]               Scheryl Marten 05/27/2020, 5:07 PM

## 2020-05-27 NOTE — Plan of Care (Signed)
  Problem: Education: Goal: Knowledge of General Education information will improve Description: Including pain rating scale, medication(s)/side effects and non-pharmacologic comfort measures Outcome: Progressing   Problem: Health Behavior/Discharge Planning: Goal: Ability to manage health-related needs will improve Outcome: Progressing   Problem: Clinical Measurements: Goal: Ability to maintain clinical measurements within normal limits will improve Outcome: Progressing Goal: Will remain free from infection Outcome: Progressing Goal: Diagnostic test results will improve Outcome: Progressing Goal: Respiratory complications will improve Outcome: Progressing Goal: Cardiovascular complication will be avoided Outcome: Progressing   Problem: Activity: Goal: Risk for activity intolerance will decrease Outcome: Progressing   Problem: Nutrition: Goal: Adequate nutrition will be maintained Outcome: Progressing   Problem: Coping: Goal: Level of anxiety will decrease Outcome: Progressing   Problem: Elimination: Goal: Will not experience complications related to bowel motility Outcome: Progressing Goal: Will not experience complications related to urinary retention Outcome: Progressing   Problem: Pain Managment: Goal: General experience of comfort will improve Outcome: Progressing   Problem: Safety: Goal: Ability to remain free from injury will improve Outcome: Progressing   Problem: Skin Integrity: Goal: Risk for impaired skin integrity will decrease Outcome: Progressing   Problem: Education: Goal: Knowledge of disease or condition will improve Outcome: Progressing Goal: Knowledge of secondary prevention will improve Outcome: Progressing Goal: Knowledge of patient specific risk factors addressed and post discharge goals established will improve Outcome: Progressing Goal: Individualized Educational Video(s) Outcome: Progressing   Problem: Coping: Goal: Will verbalize  positive feelings about self Outcome: Progressing Goal: Will identify appropriate support needs Outcome: Progressing   Problem: Health Behavior/Discharge Planning: Goal: Ability to manage health-related needs will improve Outcome: Progressing   Problem: Self-Care: Goal: Ability to participate in self-care as condition permits will improve Outcome: Progressing Goal: Verbalization of feelings and concerns over difficulty with self-care will improve Outcome: Progressing Goal: Ability to communicate needs accurately will improve Outcome: Progressing   Problem: Nutrition: Goal: Risk of aspiration will decrease Outcome: Progressing Goal: Dietary intake will improve Outcome: Progressing   Problem: Intracerebral Hemorrhage Tissue Perfusion: Goal: Complications of Intracerebral Hemorrhage will be minimized Outcome: Progressing   

## 2020-05-28 ENCOUNTER — Inpatient Hospital Stay (HOSPITAL_COMMUNITY): Payer: Medicaid Other

## 2020-05-28 DIAGNOSIS — R195 Other fecal abnormalities: Secondary | ICD-10-CM

## 2020-05-28 DIAGNOSIS — N390 Urinary tract infection, site not specified: Secondary | ICD-10-CM

## 2020-05-28 DIAGNOSIS — D509 Iron deficiency anemia, unspecified: Secondary | ICD-10-CM

## 2020-05-28 LAB — GLUCOSE, CAPILLARY
Glucose-Capillary: 105 mg/dL — ABNORMAL HIGH (ref 70–99)
Glucose-Capillary: 120 mg/dL — ABNORMAL HIGH (ref 70–99)
Glucose-Capillary: 121 mg/dL — ABNORMAL HIGH (ref 70–99)
Glucose-Capillary: 132 mg/dL — ABNORMAL HIGH (ref 70–99)
Glucose-Capillary: 135 mg/dL — ABNORMAL HIGH (ref 70–99)
Glucose-Capillary: 141 mg/dL — ABNORMAL HIGH (ref 70–99)
Glucose-Capillary: 141 mg/dL — ABNORMAL HIGH (ref 70–99)

## 2020-05-28 LAB — CBC WITH DIFFERENTIAL/PLATELET
Abs Immature Granulocytes: 0.1 10*3/uL — ABNORMAL HIGH (ref 0.00–0.07)
Basophils Absolute: 0.1 10*3/uL (ref 0.0–0.1)
Basophils Relative: 0 %
Eosinophils Absolute: 0.1 10*3/uL (ref 0.0–0.5)
Eosinophils Relative: 1 %
HCT: 22.9 % — ABNORMAL LOW (ref 39.0–52.0)
Hemoglobin: 6.6 g/dL — CL (ref 13.0–17.0)
Immature Granulocytes: 1 %
Lymphocytes Relative: 7 %
Lymphs Abs: 1.2 10*3/uL (ref 0.7–4.0)
MCH: 29.9 pg (ref 26.0–34.0)
MCHC: 28.8 g/dL — ABNORMAL LOW (ref 30.0–36.0)
MCV: 103.6 fL — ABNORMAL HIGH (ref 80.0–100.0)
Monocytes Absolute: 1.3 10*3/uL — ABNORMAL HIGH (ref 0.1–1.0)
Monocytes Relative: 8 %
Neutro Abs: 14.1 10*3/uL — ABNORMAL HIGH (ref 1.7–7.7)
Neutrophils Relative %: 83 %
Platelets: 216 10*3/uL (ref 150–400)
RBC: 2.21 MIL/uL — ABNORMAL LOW (ref 4.22–5.81)
RDW: 18.1 % — ABNORMAL HIGH (ref 11.5–15.5)
WBC: 16.8 10*3/uL — ABNORMAL HIGH (ref 4.0–10.5)
nRBC: 0 % (ref 0.0–0.2)

## 2020-05-28 LAB — COMPREHENSIVE METABOLIC PANEL
ALT: 105 U/L — ABNORMAL HIGH (ref 0–44)
AST: 88 U/L — ABNORMAL HIGH (ref 15–41)
Albumin: 1.9 g/dL — ABNORMAL LOW (ref 3.5–5.0)
Alkaline Phosphatase: 159 U/L — ABNORMAL HIGH (ref 38–126)
Anion gap: 9 (ref 5–15)
BUN: 64 mg/dL — ABNORMAL HIGH (ref 8–23)
CO2: 21 mmol/L — ABNORMAL LOW (ref 22–32)
Calcium: 8.8 mg/dL — ABNORMAL LOW (ref 8.9–10.3)
Chloride: 126 mmol/L — ABNORMAL HIGH (ref 98–111)
Creatinine, Ser: 1.56 mg/dL — ABNORMAL HIGH (ref 0.61–1.24)
GFR, Estimated: 49 mL/min — ABNORMAL LOW (ref >60)
Glucose, Bld: 123 mg/dL — ABNORMAL HIGH (ref 70–99)
Potassium: 4.2 mmol/L (ref 3.5–5.1)
Sodium: 156 mmol/L — ABNORMAL HIGH (ref 135–145)
Total Bilirubin: 0.7 mg/dL (ref 0.3–1.2)
Total Protein: 7.4 g/dL (ref 6.5–8.1)

## 2020-05-28 LAB — FOLATE RBC
Folate, Hemolysate: 264 ng/mL
Folate, RBC: 1205 ng/mL (ref >498)
Hematocrit: 21.9 % — ABNORMAL LOW (ref 37.5–51.0)

## 2020-05-28 LAB — PREPARE RBC (CROSSMATCH)

## 2020-05-28 LAB — MAGNESIUM: Magnesium: 2.7 mg/dL — ABNORMAL HIGH (ref 1.7–2.4)

## 2020-05-28 MED ORDER — DIPHENHYDRAMINE HCL 25 MG PO CAPS: 25.0000 mg | ORAL_CAPSULE | Freq: Once | ORAL | Status: DC

## 2020-05-28 MED ORDER — ACETAMINOPHEN 160 MG/5ML PO SOLN
650.0000 mg | ORAL | Status: DC | PRN
  Administered 2020-05-29 – 2020-07-07 (×4): 650 mg
  Filled 2020-05-28 (×4): qty 20.3

## 2020-05-28 MED ORDER — ACETAMINOPHEN 325 MG PO TABS: 650.0000 mg | ORAL_TABLET | ORAL | Status: DC | PRN

## 2020-05-28 MED ORDER — SODIUM CHLORIDE 0.9 % IV SOLN
1.0000 g | INTRAVENOUS | Status: AC
  Administered 2020-05-28 – 2020-06-01 (×5): 1 g via INTRAVENOUS
  Filled 2020-05-28 (×6): qty 10

## 2020-05-28 MED ORDER — SODIUM CHLORIDE 0.9% IV SOLUTION: Freq: Once | INTRAVENOUS | Status: AC

## 2020-05-28 MED ORDER — DEXTROSE 5 % IV SOLN: INTRAVENOUS | Status: AC

## 2020-05-28 MED ORDER — ACETAMINOPHEN 325 MG PO TABS: 650.0000 mg | ORAL_TABLET | Freq: Once | ORAL | Status: DC

## 2020-05-28 MED ORDER — DIPHENHYDRAMINE HCL 25 MG PO CAPS
25.0000 mg | ORAL_CAPSULE | Freq: Once | ORAL | Status: AC
  Administered 2020-05-28: 25 mg
  Filled 2020-05-28 (×2): qty 1

## 2020-05-28 MED ORDER — POLYETHYLENE GLYCOL 3350 17 GM/SCOOP PO POWD
0.5000 | Freq: Once | ORAL | Status: DC
  Filled 2020-05-28: qty 255

## 2020-05-28 MED ORDER — ACETAMINOPHEN 325 MG PO TABS
650.0000 mg | ORAL_TABLET | Freq: Once | ORAL | Status: AC
  Administered 2020-05-28: 650 mg
  Filled 2020-05-28 (×2): qty 2

## 2020-05-28 MED ORDER — ACETAMINOPHEN 650 MG RE SUPP: 650.0000 mg | RECTAL | Status: DC | PRN

## 2020-05-28 MED ORDER — FUROSEMIDE 10 MG/ML IJ SOLN
20.0000 mg | Freq: Once | INTRAMUSCULAR | Status: AC
  Administered 2020-05-29: 20 mg via INTRAVENOUS
  Filled 2020-05-28: qty 4

## 2020-05-28 NOTE — Plan of Care (Signed)
Pt alert with aphasia. Pt is on RA. UA sent to lab. TF restarted and tolerating the feeding. BG q4hrs. Q2hr turns. Abrasion to R buttocks, foam in place. Mittens utilized for safety precautions. Drainage to R eye.  Problem: Education: Goal: Knowledge of General Education information will improve Description: Including pain rating scale, medication(s)/side effects and non-pharmacologic comfort measures Outcome: Progressing   Problem: Health Behavior/Discharge Planning: Goal: Ability to manage health-related needs will improve Outcome: Progressing   Problem: Clinical Measurements: Goal: Ability to maintain clinical measurements within normal limits will improve Outcome: Progressing Goal: Will remain free from infection Outcome: Progressing Goal: Diagnostic test results will improve Outcome: Progressing Goal: Respiratory complications will improve Outcome: Progressing Goal: Cardiovascular complication will be avoided Outcome: Progressing   Problem: Activity: Goal: Risk for activity intolerance will decrease Outcome: Progressing   Problem: Nutrition: Goal: Adequate nutrition will be maintained Outcome: Progressing   Problem: Coping: Goal: Level of anxiety will decrease Outcome: Progressing   Problem: Elimination: Goal: Will not experience complications related to bowel motility Outcome: Progressing Goal: Will not experience complications related to urinary retention Outcome: Progressing   Problem: Pain Managment: Goal: General experience of comfort will improve Outcome: Progressing   Problem: Safety: Goal: Ability to remain free from injury will improve Outcome: Progressing   Problem: Skin Integrity: Goal: Risk for impaired skin integrity will decrease Outcome: Progressing   Problem: Education: Goal: Knowledge of disease or condition will improve Outcome: Progressing Goal: Knowledge of secondary prevention will improve Outcome: Progressing Goal: Knowledge of  patient specific risk factors addressed and post discharge goals established will improve Outcome: Progressing Goal: Individualized Educational Video(s) Outcome: Progressing   Problem: Coping: Goal: Will verbalize positive feelings about self Outcome: Progressing Goal: Will identify appropriate support needs Outcome: Progressing   Problem: Health Behavior/Discharge Planning: Goal: Ability to manage health-related needs will improve Outcome: Progressing   Problem: Self-Care: Goal: Ability to participate in self-care as condition permits will improve Outcome: Progressing Goal: Verbalization of feelings and concerns over difficulty with self-care will improve Outcome: Progressing Goal: Ability to communicate needs accurately will improve Outcome: Progressing   Problem: Nutrition: Goal: Risk of aspiration will decrease Outcome: Progressing Goal: Dietary intake will improve Outcome: Progressing   Problem: Intracerebral Hemorrhage Tissue Perfusion: Goal: Complications of Intracerebral Hemorrhage will be minimized Outcome: Progressing

## 2020-05-28 NOTE — Progress Notes (Signed)
Physical Therapy Treatment Patient Details Name: Alan Everett MRN: [id number] DOB: [birth year] Today's Date: 05/28/2020    History of Present Illness Patient is a 64 y/o male with PMH ETOH use, tobacco abuse, admitted with R side weakness,sensory loss and speech deficits.  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    PT Comments    Pt attempting to converse more with PT today, tracks to R to find PT, and seems to state yes/no appropriately. Pt demonstrating restlessness verging on agitation with mobility this session, but calms with cessation. Pt requiring max-total assist +2 for mobility tasks, tolerating EOB sitting tasks x10 minutes as well as attempted stand. PT continuing to recommend SNF post-acutely.    Follow Up Recommendations  SNF;Supervision/Assistance - 24 hour     Equipment Recommendations  Wheelchair cushion (measurements PT);Wheelchair (measurements PT);Hospital bed;Other (comment)    Recommendations for Other Services       Precautions / Restrictions Precautions Precautions: Fall Precaution Comments: L handmitt, R pushing, G tube    Mobility  Bed Mobility Overal bed mobility: Needs Assistance Bed Mobility: Supine to Sit;Sit to Supine Rolling: Mod assist   Supine to sit: Total assist;+2 for physical assistance Sit to supine: Total assist;+2 for physical assistance   General bed mobility comments: mod assist for rolling especially towards L for truncal translation and LE management. Total +2 for supine<>sit for trunk and LE management, scooting to and from EOB. Pt resistant to moving to EOB and swinging LUE to resist.  Transfers Overall transfer level: Needs assistance Equipment used: 2 person hand held assist Transfers: Sit to/from Stand;Lateral/Scoot Transfers Sit to Stand: +2 physical assistance;Max assist        Lateral/Scoot Transfers: Max assist;+2 physical assistance;+2  safety/equipment General transfer comment: Pt dependent for stand attempt initially, but transitioning to max assist +2 when pt understands task is standing. Assist for power up, hip extension, steadying, and translating pt hips to [facility].  Ambulation/Gait                 Stairs             Wheelchair Mobility    Modified Rankin (Stroke Patients Only) Modified Rankin (Stroke Patients Only) Pre-Morbid Rankin Score: No symptoms Modified Rankin: Severe disability     Balance Overall balance assessment: Needs assistance Sitting-balance support: No upper extremity supported;Feet supported;Single extremity supported Sitting balance-Leahy Scale: Poor Sitting balance - Comments: delayed posterior and L righting reaction, resistant to R lateral leaning when PT attempting to place pt on elbow. EOB sitting x10 minutes, AP/lateral perturbations Postural control: Posterior lean   Standing balance-Leahy Scale: Zero Standing balance comment: max +2                            Cognition Arousal/Alertness: Awake/alert Behavior During Therapy: Restless;Agitated Overall Cognitive Status: Difficult to assess                                 General Comments: Pt swatting at PT with L mitted hand and pulling at condom cath, improves with sitting EOB although not completely resolved. Pt attempting verbalize more today, stating yes/no and talking in full statements however PT unable to understand. Attentive to R side with verbal and visual cuing from PT .      Exercises General Exercises - Lower Extremity Heel Slides: PROM;Right;5 reps;Supine  General Comments        Pertinent Vitals/Pain Pain Assessment: Faces Faces Pain Scale: Hurts little more Pain Location: RLE during ROM Pain Descriptors / Indicators: Grimacing;Restless Pain Intervention(s): Limited activity within patient's tolerance;Monitored during session;Repositioned    Home Living                       Prior Function            PT Goals (current goals can now be found in the care plan section) Acute Rehab PT Goals Patient Stated Goal: unable to state PT Goal Formulation: Patient unable to participate in goal setting Time For Goal Achievement: 06/03/20 Potential to Achieve Goals: Poor Progress towards PT goals: Progressing toward goals    Frequency    Min 3X/week      PT Plan Current plan remains appropriate    Co-evaluation              AM-PAC PT "6 Clicks" Mobility   Outcome Measure  Help needed turning from your back to your side while in a flat bed without using bedrails?: Total Help needed moving from lying on your back to sitting on the side of a flat bed without using bedrails?: Total Help needed moving to and from a bed to a chair (including a wheelchair)?: Total Help needed standing up from a chair using your arms (e.g., wheelchair or bedside chair)?: Total Help needed to walk in hospital room?: Total Help needed climbing 3-5 steps with a railing? : Total 6 Click Score: 6    End of Session   Activity Tolerance: Patient tolerated treatment well;Patient limited by fatigue Patient left: in bed;with bed alarm set;with call bell/phone within reach;with SCD's reapplied;with restraints reapplied (bilat hand mitts) Nurse Communication: Mobility status PT Visit Diagnosis: Unsteadiness on feet (R26.81);Muscle weakness (generalized) (M62.81);Difficulty in walking, not elsewhere classified (R26.2);Other symptoms and signs involving the nervous system (R29.898);Hemiplegia and hemiparesis Hemiplegia - Right/Left: Right Hemiplegia - dominant/non-dominant: Dominant Hemiplegia - caused by: Nontraumatic intracerebral hemorrhage     Time: 1130-1150 PT Time Calculation (min) (ACUTE ONLY): 20 min  Charges:  $Therapeutic Activity: 8-22 mins                    Evelio Rueda E, PT Acute Rehabilitation Services Pager [phone]  Office [phone]    Barbee Mamula D  Samirah Scarpati 05/28/2020, 2:55 PM

## 2020-05-28 NOTE — Progress Notes (Addendum)
CRITICAL VALUE STICKER  CRITICAL VALUE: Hemoglobin 6.6  MESSENGER (representative from lab):   MD NOTIFIED: B. Chotiner  TIME OF NOTIFICATION: 0414  RESPONSE: awaiting.  Orders placed at 0420.

## 2020-05-28 NOTE — Progress Notes (Signed)
Per report from off shift RN, pt needs consent from family for blood transfusion due to altered mental status and needed a type and screen. RN attempted to contact family unsuccessfully, voicemail with callback number left. RN notified MD. Family finally reached at 1800, consent received.   Blood transfusion started at 1850.

## 2020-05-28 NOTE — Progress Notes (Signed)
Attempted to reach patient's niece [name]) by phone regarding consent for possible blood transfusion. Unable to reach. I left a voicemail with my call back number.   Can try again this afternoon.

## 2020-05-28 NOTE — Progress Notes (Signed)
Nutrition Follow-up  DOCUMENTATION CODES:   Severe malnutrition in context of social or environmental circumstances  INTERVENTION:  Continue via PEG: -Osmolite 1.2 cal @ 16m/hr -free water per MD, currently 207mQ4H  Provides 87 g of protein, 1872 kcals and 1264 mL of free water(246463motal free water with flushes) Meets 100% estimated calorie and protein needs   NUTRITION DIAGNOSIS:   Severe Malnutrition related to social / environmental circumstances as evidenced by severe fat depletion, severe muscle depletion.  Ongoing   GOAL:   Patient will meet greater than or equal to 90% of their needs  Met with TF  MONITOR:   TF tolerance, Diet advancement, Weight trends, Skin  REASON FOR ASSESSMENT:   Consult, New TF Enteral/tube feeding initiation and management  ASSESSMENT:   [age] male presents with acute onset of right sided weakness and mixed recept and expressive aphasia and admitted with acute left thalamocapsular ICH, severe HTN. PMH includes EtOH and tobacco abuse  10/06 Admitted 10/08 Cortrak placed(gastric) 10/13 repeat CT head showed stable L thalamic hemorrhageand intraventricular extension since 05/08/2020. Regional edema, mild regional mass-effect and mild lateral ventriculomegaly and trace SAH. 10/19 failed MBS 10/22 s/p PEG  Pt's hospital course has been complicated by recurrent blood loss anemia. GI following.   Pt is verbalizing more today but his speech is still unintelligible. SLP is recommending pt have a repeat MBS which will likely occur early next week. They are recommending he remain NPO for now with TF via PEG. Current orders are  Osmolite 1.2 _0 /hr with 200m44mee water Q4H. This provides 87 g of protein, 1872 kcals and 1264 mL of free water(2464ml39mal free water with flushes). Meets 100% estimated calorie and protein needs. Will continue with current nutrition plan of care and make adjustments as appropriate pending the results of the  MBS and adequacy of pt's intake.   Admit wt: 65.7 kg Current wt: 63.4 kg  UOP: 2250ml 32mhours  Labs: Na 156 (H), Mg 2.7 (H), CBGs 141-120-141 Medications: lasix, ss novolog, 15 units lantus daily, protonix, miralax, senokot-s IVF: D5 @ 50ml/h71miet Order:   Diet Order            Diet NPO time specified  Diet effective now                 EDUCATION NEEDS:   Not appropriate for education at this time  Skin:  Skin Assessment: Skin Integrity Issues: Skin Integrity Issues:: Other (Comment) Other: puncture abdomen  Last BM:  10/29  Height:   Ht Readings from Last 1 Encounters:  04/09/20 _1  (1.676 m)    Weight:   Wt Readings from Last 1 Encounters:  05/27/20 63.4 kg    BMI:  Body mass index is 22.56 kg/m.  Estimated Nutritional Needs:   Kcal:  1780-1980 kcals  Protein:  85-95 g  Fluid:  >/= 1.8 L    Alan Stitely Larkin InaD, LDN RD pager number and weekend/on-call pager number located in [email or web address]

## 2020-05-28 NOTE — Progress Notes (Signed)
  Speech Language Pathology Treatment: Dysphagia  Patient Details Name: Alan Everett MRN: [id number] DOB: 1956-01-25 Today's Date: 05/28/2020 Time: 6967-8938 SLP Time Calculation (min) (ACUTE ONLY): 10 min  Assessment / Plan / Recommendation Clinical Impression  Pt seen for readiness for repeat MBS. He was verbalizing with improved spontaneity today - output was not intelligible, but it was more frequent, and he produced basic yes/no responses (vocalizations) when asked questions about preferences. He demonstrated active acceptance of ice chips, with initiative to masticate and swallow. There were no overt s/s of aspiration. He required verbal/tactile cues for labial seal and attention. Recommend repeating MBS (last study was 10/19) - radiology schedule was full today. Will plan for early next week (Mon or Tuesday).  HPI HPI: Alan Everett is a 64 y.o. male with a PMHx of alcohol use and tobacco abuse who presents acutely to the ED via EMS as a Code Stroke for acute onset of right sided weakness. Patient with disoriented, right leg weakness, right limb ataxia, right decreased sensation and Global aphasia on exam. 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 Worsening of intraventricular extension.  MBS 10/19 mod-severe dysphagia, continue NPO. PEG 10/22.       SLP Plan  Continue with current plan of care       Recommendations  Diet recommendations: NPO Medication Administration: Via alternative means                Oral Care Recommendations: Oral care QID Follow up Recommendations: Skilled Nursing facility SLP Visit Diagnosis: Dysphagia, oropharyngeal phase (R13.12) Plan: Continue with current plan of care       GO                Blenda Mounts Laurice 05/28/2020, 10:17 AM  Marchelle Folks L. Samson Frederic, MA CCC/SLP Acute Rehabilitation Services Office number [phone] Pager [phone]

## 2020-05-28 NOTE — Progress Notes (Addendum)
Daily Rounding Note  05/28/2020, 9:22 AM  LOS: 23 days     Family contact is a niece, [name].  Phone number [phone]  Dr. Nelson Chimes requesting GI revisit patient for further acutely declining Hgb  SUBJECTIVE:   Chief complaint: Anemia   Hx IDA of unclear etiology.   Followed by Dr Donneta Romberg heme/onc.  She  recommended Feraheme and CT abdomen pelvis but patient did not want to pay for these as he is self-pay for medical expenses. GI MD is Dr Norma Fredrickson in [place].   03/01/20 EGD for IDA, chronic blood loss.  Hgb 3.5, MCV 64 >> 4 PRBCs >> 10: Mild gastritis, O/w normal study. Using frequent NSAIDs and BC powders.    03/01/20 Colonoscopy    Sigmoid diverticulosis.  O/w normal study to ICV. Started po iron 325 mg/daily but no PPI etc after admission in early 02/2020.   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  Aspiration pneumonia.  Required tube feeds via core track. GI consult sought for Hb dropped from 12.4 >> 9.5 and FOBT + stool though no overt hematochezia or melena.  Concomitant rise in BUN.  Mild to moderate elevation of transaminases.  Chart mentions alcohol intake of sixpack of beer daily. Given the very recent EGD and colonoscopy, [name] did not feel these needed repeating and felt the next step would be capsule endoscopy which would require endoscopic placement.  With the recent stroke and disability, unexplained leukocytosis, endoscopy and sedation was high risk so VCE not pursued.   05/11/2020 CTAP w contrast revealed pulmonary opacities, mild fatty liver.  Two indeterminate hepatic lesions that could be better characterized with MRI.  No intra-abdominal or pelvic pathology. Since GI signed off, pt has had persistent anemia and dropping hgbs.  Hgbs dropping to 6.8 on 10/13, 5.5 on 10/26.  Responds appropriately to PRBCs but Hgb then drops again.  Today it is 6.6 down from 9.1 three days ago.  In addition  to the anemia patient has rising alkaline phosphatase and transaminases with stable, normal T bili. S/p 05/21/2020 feeding G-tube placement in IR.  Has not had any overt melena or hematochezia. 05/27/20 CTAP w/o contrast: Percutaneous G-tube in place.  Cholelithiasis without cholecystitis.   2.2 x 2.2 cm indistinct, hypoattenuating lesion in the falciform ligament without change from prior study.  Nothing to explain patient's complaint of abdominal pain.  Decreased groundglass pulmonary opacities. 05/27/2020 abdominal ultrasound:   Neurologically his aphasia and dysphagia persist.  SLP still rec NPO. Tolerating tube feeds.  Staff report loose, brown stools.   Requiring safety mittens, abd binder to protect tubes, lines, maintain safety.   OBJECTIVE:         Vital signs in last 24 hours:    Temp:  [98 F (36.7 C)-100.3 F (37.9 C)] 99.2 F (37.3 C) (10/29 0830) Pulse Rate:  [110-124] 110 (10/29 0830) Resp:  [18-28] 26 (10/29 0830) BP: (101-121)/(56-66) 121/60 (10/29 0830) SpO2:  [98 %-100 %] 100 % (10/29 0830) Last BM Date: 05/27/20 Filed Weights   05/25/20 0352 05/26/20 0429 05/27/20 0407  Weight: 60.8 kg 60.8 kg 63.4 kg   General: Patient looks chronically ill, older than stated age.  Speaking gibberish. Heart: RRR. Chest: Clear bilaterally.  No labored breathing. Abdomen: Soft.  Slight tenderness on the left.  Nondistended.  PEG site at mid abdomen benign with some dried crust of serum/blood. Extremities: No CCE. Neuro/Psych: Right hemiparesis.  Expressive aphasia.  Speaking quite  a bit but no words discernible.  Moving about in the bed.  No tremor no decorticate/decerebrate posturing  Intake/Output from previous day: 10/28 0701 - 10/29 0700 In: 2428.8 [NG/GT:1368.8] Out: 2255 [Urine:2250; Drains:5]  Intake/Output this shift: No intake/output data recorded.  Lab Results: Recent Labs    05/26/20 1422 05/27/20 0354 05/28/20 0242  WBC 10.8* 10.3 16.8*  HGB 8.6* 7.6* 6.6*   HCT 28.8* 25.6* 22.9*  PLT 252 215 216   BMET Recent Labs    05/26/20 1025 05/27/20 0354 05/28/20 0242  NA 154* 156* 156*  K 4.4 4.6 4.2  CL 123* 125* 126*  CO2 23 23 21*  GLUCOSE 139* 126* 123*  BUN 76* 65* 64*  CREATININE 1.61* 1.49* 1.56*  CALCIUM 9.0 8.7* 8.8*   LFT Recent Labs    05/26/20 1025 05/27/20 0354 05/28/20 0242  PROT 8.1 7.2 7.4  ALBUMIN 2.3* 1.9* 1.9*  AST 134* 117* 88*  ALT 140* 121* 105*  ALKPHOS 209* 181* 159*  BILITOT 0.7 0.7 0.7   PT/INR No results for input(s): LABPROT, INR in the last 72 hours. Hepatitis Panel No results for input(s): HEPBSAG, HCVAB, HEPAIGM, HEPBIGM in the last 72 hours.  Studies/Results: CT ABDOMEN PELVIS WO CONTRAST  Result Date: 05/27/2020 CLINICAL DATA:  Abdominal pain, fever EXAM: CT ABDOMEN AND PELVIS WITHOUT CONTRAST TECHNIQUE: Multidetector CT imaging of the abdomen and pelvis was performed following the standard protocol without IV contrast. COMPARISON:  CT abdomen pelvis dated 05/11/2020. FINDINGS: Lower chest: Minimal bibasilar ground-glass opacities are noted, decreased since prior exam. Hepatobiliary: An indistinct hypoattenuating lesion along the falciform ligament measures 2.2 x 2.2 cm and is incompletely characterized on this exam. This does not appear significantly changed since 05/11/2020. A gallstone is noted in the gallbladder neck. No gallbladder wall thickening or biliary dilatation. Pancreas: Unremarkable. No pancreatic ductal dilatation or surrounding inflammatory changes. Spleen: Normal in size without focal abnormality. Adrenals/Urinary Tract: Adrenal glands are unremarkable. Kidneys are normal, without renal calculi, focal lesion, or hydronephrosis. Bladder is distended. Stomach/Bowel: A percutaneous gastrostomy and tube is noted. Enteric contrast reaches the rectum. Appendix appears normal. No evidence of bowel wall thickening, distention, or inflammatory changes. Vascular/Lymphatic: Aortic atherosclerosis.  No enlarged abdominal or pelvic lymph nodes. Reproductive: Prostate is unremarkable. Other: A fat containing right inguinal hernia is noted. There is no free intraperitoneal air. Musculoskeletal: No acute or significant osseous findings. IMPRESSION: 1. No findings to explain the patient's symptoms. 2. Cholelithiasis without evidence of acute cholecystitis. Aortic Atherosclerosis (ICD10-I70.0). Electronically Signed   By: Romona Curls M.D.   On: 05/27/2020 15:45   US Abdomen Limited RUQ (LIVER/GB)  Result Date: 05/27/2020 CLINICAL DATA:  Transaminitis EXAM: ULTRASOUND ABDOMEN LIMITED RIGHT UPPER QUADRANT COMPARISON:  CT from same day FINDINGS: Gallbladder: Gallstones are noted. There is no gallbladder wall thickening. The sonographic Eulah Pont sign is negative. There is no pericholecystic free fluid. Common bile duct: Diameter: 3 mm Liver: No focal lesion identified. Within normal limits in parenchymal echogenicity. Portal vein is patent on color Doppler imaging with normal direction of blood flow towards the liver. Other: None. IMPRESSION: There is cholelithiasis without secondary signs of acute cholecystitis. Electronically Signed   By: Katherine Mantle M.D.   On: 05/27/2020 19:13    Scheduled Meds: . sodium chloride   Intravenous Once  . acetaminophen  650 mg Per Tube Once  . chlorhexidine  15 mL Mouth Rinse BID  . diphenhydrAMINE  25 mg Per Tube Once  . free water  200 mL  Per Tube Q4H  . furosemide  20 mg Intravenous Once  . insulin aspart  0-9 Units Subcutaneous Q4H  . insulin glargine  15 Units Subcutaneous Daily  . mouth rinse  15 mL Mouth Rinse q12n4p  . pantoprazole sodium  40 mg Per Tube BID  . senna-docusate  1 tablet Per Tube BID  . sodium chloride flush  10-40 mL Intracatheter Q12H   Continuous Infusions: . cefTRIAXone (ROCEPHIN)  IV 1 g (05/28/20 0831)  . dextrose 50 mL/hr at 05/28/20 0830  . feeding supplement (OSMOLITE 1.2 CAL) 65 mL/hr at 05/27/20 2039   PRN  Meds:.acetaminophen **OR** acetaminophen (TYLENOL) oral liquid 160 mg/5 mL **OR** acetaminophen, hydrALAZINE, levalbuterol, sodium chloride flush   ASSESMENT:   *  Chronic anemia.  Responding appropriately to transfusion but within a few days declines.  FOBT +2 to 3 weeks ago.  Has not had any overt GI bleeding.  EGD and colonoscopyiIn early August showed mild gastritis and sigmoid diverticulosis. IDA in early August but currently macrocytic with low iron, low iron, low iron sats but ferritin has improved from 3 to 254.  Folate and B12 WNL  *   Neurogenic dysphagia.  Severe protein calorie malnutrition.  10/22 G tube placement.  Tolerating TF  *   Hemorrhagic CVA   *    Mild elevation of LFTs. Uncomplicated cholelithiasis per ultrasound.  Indeterminant liver lesions per CT  *    Sepsis, fever in setting of aspiration pneumonia. Persistent fevers, rising WBCs (16.8 today) following treatment for the pneumonia.  Urinalysis yesterday suggestive of UTI.  Atelectasis without pneumonia on today's CXR.  On Rocephin.    *    IDDM.      PLAN   *   ? Pursue repeat EGD vs entersoscopy and placement of VCE?    *   ?  Restart enteric iron given that he had iron deficiency a few months ago?    Jennye Moccasin  05/28/2020, 9:22 AM Phone [phone]

## 2020-05-28 NOTE — Progress Notes (Signed)
PROGRESS NOTE    Alan Everett  XHB:[id number] DOB: 1956/01/11 DOA: 05/05/2020 PCP: Patient, No Pcp Per   Brief Narrative:  64 y.o.malewith a PMHx of alcohol use and tobacco abuse who presented to the ED via EMS as a Code Stroke for acute onset of right sided weakness. he also had sensory loss and weakness involving his RUE and RLE. 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 Neurosurgery was consulted for EVD consideration which was then deferred.  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  Transferred under [place] on 05/09/2020.  On 05/08/2020, patient spiked fever and he was started on broad-spectrum antibiotics/IV Zosyn empirically.  Despite of antibiotics, patient's white blood cells as well as fever continue to get worse with a T-max of 103.2 at around noon on 05/11/2020.  Previous hospitalist Dr. Shelton Silvas had discussed case with ID who recommended scanning his chest and abdomen and continuing antibiotics.  CT abdomen chest and pelvis shows bibasilar infiltrates suspecting possible pneumonia but no other pathology. 2D EchoEF 60-65%. UDS positive for cocaine.  Hospital course complicated by recurrent blood loss anemia.   Assessment & Plan:   Active Problems:   ICH (intracerebral hemorrhage) (HCC)   Polysubstance abuse (HCC)   Dysphagia, post-stroke   Hyperglycemia   Hypokalemia   Hypomagnesemia   Fever   SOB (shortness of breath)   Leukocytosis   Aspiration pneumonia of both lower lobes ([place])   Palliative care by specialist   Goals of care, counseling/discussion   DNR (do not resuscitate) discussion   Protein-calorie malnutrition, severe    Macrocytic anemia, unclear etiology; acutely worsening -Recurrent drop in hemoglobin, this morning 6.6.  PRBC transfusion ordered -CT abdomen pelvis without contrast 10/28-no acute pathology -Previously deemed high risk by GI  for endoscopy as patient will require EGD for video capsule placement.  Case discussed again with GI who will see the patient in consultation again. -TSH, B12-normal  Acute Hemorrhagic Stroke/cerebral edema -Likely in the setting of severe hypertension, alcohol and cocaine use. -Previously was discussed with neurosurgery and neurology. -Echocardiogram EF 60 to 65% -UDS positive for cocaine.  Leukocytosis secondary to urinary tract infection -Previously positive for Klebsiella therefore will start patient on Rocephin for 5 days-last day 06/01/2020  Essential hypertension -Borderline low blood pressure-discontinue lisinopril and Norvasc  Hyperlipidemia -LDL less than 70  Mildly elevated AST/ALT/Alk phos/Hypernatremia, persist Hypophosphatemia/hypokalemia, monitor -Right upper quadrant ultrasound-cholelithiasis without acute cholecystitis -Increase frequency of free water flushes to every 4 hours -Add D5 water 50 cc/h  Moderate to severe dysphagia -PEG tube placed on 05/21/2020. -Monitor for aspiration-currently does not have any cough  Tobacco Abuse -Counseled to quit using this  Cocaine abuse -Counseled to quit using this  GERD -PPI twice daily  DVT prophylaxis: SCD's Start: 05/05/20 1938 Code Status: Full code Family Communication:   Status is: Inpatient  Remains inpatient appropriate because:Inpatient level of care appropriate due to severity of illness   Dispo: The patient is from: Home              Anticipated d/c is to: SNF              Anticipated d/c date is: 2 days              Patient currently is not medically stable to d/c.  Still confused, need to closely monitor his mental status.  I anticipate he will be a difficult  placement given his ongoing issues, lack of insurance.   Body mass index is 22.56 kg/m.     Subjective: Laying in the bed does not appear in acute distress.  Does not participate in any conversation.  Review of  Systems Otherwise negative except as per HPI, including: Unable to obtain  Examination: Constitutional: Not in acute distress, bilateral mittens in place Respiratory: Clear to auscultation bilaterally Cardiovascular: Normal sinus rhythm, no rubs Abdomen: Abdomen is slightly tense with positive bowel sounds. Musculoskeletal: No edema noted Skin: No rashes seen Neurologic: Unable to assess Psychiatric: Unable to assess  PEG tube in place Condom catheter in place  Objective: Vitals:   05/27/20 2100 05/28/20 0100 05/28/20 0430 05/28/20 0729  BP: (!) 108/57 120/60 (!) 110/56 118/66  Pulse: (!) 113 (!) 124 (!) 112 (!) 114  Resp: _0 (!) 28  Temp: 98.4 F (36.9 C) 100.2 F (37.9 C) 98.2 F (36.8 C) 100.3 F (37.9 C)  TempSrc: Oral Oral Oral Axillary  SpO2: 98% 98% 100% 100%  Weight:        Intake/Output Summary (Last 24 hours) at 05/28/2020 0811 Last data filed at 05/27/2020 2000 Gross per 24 hour  Intake 1199.75 ml  Output 2255 ml  Net -1055.25 ml   Filed Weights   05/25/20 0352 05/26/20 0429 05/27/20 0407  Weight: 60.8 kg 60.8 kg 63.4 kg     Data Reviewed:   CBC: Recent Labs  Lab 05/24/20 0601 05/24/20 0601 05/25/20 0517 05/25/20 0517 05/25/20 0851 05/26/20 0416 05/26/20 1422 05/27/20 0354 05/28/20 0242  WBC 13.2*   < > 10.6*  --   --  11.6* 10.8* 10.3 16.8*  NEUTROABS 10.7*  --  8.2*  --   --  8.6*  --  8.2* 14.1*  HGB 7.7*   < > 5.5*   < > 5.8* 9.1* 8.6* 7.6* 6.6*  HCT 26.6*   < > 19.5*   < > 20.7* 29.4* 28.8* 25.6* 22.9*  MCV 103.9*   < > 104.8*  --   --  97.4 98.3 100.8* 103.6*  PLT 242   < > 244  --   --  242 252 215 216   < > = values in this interval not displayed.   Basic Metabolic Panel: Recent Labs  Lab 05/24/20 0601 05/25/20 0517 05/26/20 1025 05/27/20 0354 05/28/20 0242  NA 150* 153* 154* 156* 156*  K 4.7 4.6 4.4 4.6 4.2  CL 117* 120* 123* 125* 126*  CO2 _1 21*  GLUCOSE 134* 140* 139* 126* 123*  BUN 74* 84* 76* 65*  64*  CREATININE 1.55* 1.71* 1.61* 1.49* 1.56*  CALCIUM 9.2 8.8* 9.0 8.7* 8.8*  MG  --   --   --  2.7* 2.7*   GFR: Estimated Creatinine Clearance: 42.9 mL/min (A) (by C-G formula based on SCr of 1.56 mg/dL (H)). Liver Function Tests: Recent Labs  Lab 05/24/20 0601 05/25/20 0517 05/26/20 1025 05/27/20 0354 05/28/20 0242  AST 122* 98* 134* 117* 88*  ALT 147* 114* 140* 121* 105*  ALKPHOS 215* 185* 209* 181* 159*  BILITOT 0.6 0.5 0.7 0.7 0.7  PROT 8.2* 7.1 8.1 7.2 7.4  ALBUMIN 2.4* 2.0* 2.3* 1.9* 1.9*   No results for input(s): LIPASE, AMYLASE in the last 168 hours. No results for input(s): AMMONIA in the last 168 hours. Coagulation Profile: No results for input(s): INR, PROTIME in the last 168 hours. Cardiac Enzymes: No results for input(s): CKTOTAL, CKMB, CKMBINDEX, TROPONINI in the  last 168 hours. BNP (last 3 results) No results for input(s): PROBNP in the last 8760 hours. HbA1C: No results for input(s): HGBA1C in the last 72 hours. CBG: Recent Labs  Lab 05/27/20 1200 05/27/20 2024 05/28/20 0021 05/28/20 0417 05/28/20 0734  GLUCAP 105* 96 121* 132* 141*   Lipid Profile: No results for input(s): CHOL, HDL, LDLCALC, TRIG, CHOLHDL, LDLDIRECT in the last 72 hours. Thyroid Function Tests: Recent Labs    05/27/20 0843  TSH 1.388   Anemia Panel: Recent Labs    05/27/20 0843  VITAMINB12 690   Sepsis Labs: Recent Labs  Lab 05/27/20 0843  PROCALCITON 0.30    Recent Results (from the past 240 hour(s))  Surgical pcr screen     Status: None   Collection Time: 05/20/20  3:00 AM   Specimen: Nasal Mucosa; Nasal Swab  Result Value Ref Range Status   MRSA, PCR NEGATIVE NEGATIVE Final   Staphylococcus aureus NEGATIVE NEGATIVE Final    Comment: (NOTE) The Xpert SA Assay (FDA approved for NASAL specimens in patients 78 years of age and older), is one component of a comprehensive surveillance program. It is not intended to diagnose infection nor to guide or monitor  treatment. Performed at [facility], [place] [address], [place], [place] [address]   Culture, blood (routine x 2)     Status: None (Preliminary result)   Collection Time: 05/24/20 10:08 AM   Specimen: BLOOD  Result Value Ref Range Status   Specimen Description BLOOD RIGHT ANTECUBITAL  Final   Special Requests   Final    BOTTLES DRAWN AEROBIC ONLY Blood Culture results may not be optimal due to an inadequate volume of blood received in culture bottles   Culture   Final    NO GROWTH 3 DAYS Performed at [facility], [place] [address], [place], [place] [address]    Report Status PENDING  Incomplete  Culture, blood (routine x 2)     Status: None (Preliminary result)   Collection Time: 05/24/20 10:14 AM   Specimen: BLOOD  Result Value Ref Range Status   Specimen Description BLOOD RIGHT ANTECUBITAL  Final   Special Requests   Final    BOTTLES DRAWN AEROBIC AND ANAEROBIC Blood Culture adequate volume   Culture   Final    NO GROWTH 3 DAYS Performed at [facility], [place] [address] [place], [place] [address]    Report Status PENDING  Incomplete         Radiology Studies: CT ABDOMEN PELVIS WO CONTRAST  Result Date: 05/27/2020 CLINICAL DATA:  Abdominal pain, fever EXAM: CT ABDOMEN AND PELVIS WITHOUT CONTRAST TECHNIQUE: Multidetector CT imaging of the abdomen and pelvis was performed following the standard protocol without IV contrast. COMPARISON:  CT abdomen pelvis dated 05/11/2020. FINDINGS: Lower chest: Minimal bibasilar ground-glass opacities are noted, decreased since prior exam. Hepatobiliary: An indistinct hypoattenuating lesion along the falciform ligament measures 2.2 x 2.2 cm and is incompletely characterized on this exam. This does not appear significantly changed since 05/11/2020. A gallstone is noted in the gallbladder neck. No gallbladder wall thickening or biliary dilatation. Pancreas: Unremarkable. No pancreatic ductal dilatation or surrounding inflammatory  changes. Spleen: Normal in size without focal abnormality. Adrenals/Urinary Tract: Adrenal glands are unremarkable. Kidneys are normal, without renal calculi, focal lesion, or hydronephrosis. Bladder is distended. Stomach/Bowel: A percutaneous gastrostomy and tube is noted. Enteric contrast reaches the rectum. Appendix appears normal. No evidence of bowel wall thickening, distention, or inflammatory changes. Vascular/Lymphatic: Aortic  atherosclerosis. No enlarged abdominal or pelvic lymph nodes. Reproductive: Prostate is unremarkable. Other: A fat containing right inguinal hernia is noted. There is no free intraperitoneal air. Musculoskeletal: No acute or significant osseous findings. IMPRESSION: 1. No findings to explain the patient's symptoms. 2. Cholelithiasis without evidence of acute cholecystitis. Aortic Atherosclerosis (ICD10-I70.0). Electronically Signed   By: Zerita Boers M.D.   On: 05/27/2020 15:45   US Abdomen Limited RUQ (LIVER/GB)  Result Date: 05/27/2020 CLINICAL DATA:  Transaminitis EXAM: ULTRASOUND ABDOMEN LIMITED RIGHT UPPER QUADRANT COMPARISON:  CT from same day FINDINGS: Gallbladder: Gallstones are noted. There is no gallbladder wall thickening. The sonographic Percell Miller sign is negative. There is no pericholecystic free fluid. Common bile duct: Diameter: 3 mm Liver: No focal lesion identified. Within normal limits in parenchymal echogenicity. Portal vein is patent on color Doppler imaging with normal direction of blood flow towards the liver. Other: None. IMPRESSION: There is cholelithiasis without secondary signs of acute cholecystitis. Electronically Signed   By: Constance Holster M.D.   On: 05/27/2020 19:13        Scheduled Meds: . sodium chloride   Intravenous Once  . acetaminophen  650 mg Oral Once  . chlorhexidine  15 mL Mouth Rinse BID  . diphenhydrAMINE  25 mg Oral Once  . free water  200 mL Per Tube Q4H  . furosemide  20 mg Intravenous Once  . insulin aspart  0-9 Units  Subcutaneous Q4H  . insulin glargine  15 Units Subcutaneous Daily  . mouth rinse  15 mL Mouth Rinse q12n4p  . pantoprazole sodium  40 mg Per Tube BID  . senna-docusate  1 tablet Per Tube BID  . sodium chloride flush  10-40 mL Intracatheter Q12H   Continuous Infusions: . cefTRIAXone (ROCEPHIN)  IV    . dextrose    . feeding supplement (OSMOLITE 1.2 CAL) 65 mL/hr at 05/27/20 2039     LOS: 23 days   Time spent= 35 mins     Arsenio Loader, MD [facility]  If 7PM-7AM, please contact night-coverage  05/28/2020, 8:11 AM

## 2020-05-28 NOTE — Progress Notes (Addendum)
   05/28/20 0729  Assess: MEWS Score  Temp 100.3 F (37.9 C) (RN Notified)  BP 118/66  Pulse Rate (!) 114 (RN Notified)  Resp (!) 28 (RN Notified)  SpO2 100 %  Assess: MEWS Score  MEWS Temp 0  MEWS Systolic 0  MEWS Pulse 2  MEWS RR 2  MEWS LOC 0  MEWS Score 4  MEWS Score Color Red  Assess: if the MEWS score is Yellow or Red  Were vital signs taken at a resting state? Yes  Focused Assessment Change from prior assessment (see assessment flowsheet)  Early Detection of Sepsis Score *See Row Information* High  MEWS guidelines implemented *See Row Information* Yes  Treat  MEWS Interventions Administered scheduled meds/treatments;Escalated (See documentation below)  Pain Scale Faces  Faces Pain Scale 0  Take Vital Signs  Increase Vital Sign Frequency  Red: Q 1hr X 4 then Q 4hr X 4, if remains red, continue Q 4hrs  Escalate  MEWS: Escalate Red: discuss with charge nurse/RN and provider, consider discussing with RRT  Notify: Charge Nurse/RN  Name of Charge Nurse/RN Notified Havy M  Date Charge Nurse/RN Notified 05/28/20  Time Charge Nurse/RN Notified 0820  Change in MEWS from yellow to red due to increased respirations and pulse. Charge nurse and MD notified, meds given, MEWS protocol implemented.

## 2020-05-29 ENCOUNTER — Inpatient Hospital Stay (HOSPITAL_COMMUNITY): Payer: Medicaid Other

## 2020-05-29 DIAGNOSIS — N390 Urinary tract infection, site not specified: Secondary | ICD-10-CM

## 2020-05-29 DIAGNOSIS — D638 Anemia in other chronic diseases classified elsewhere: Secondary | ICD-10-CM

## 2020-05-29 DIAGNOSIS — R710 Precipitous drop in hematocrit: Secondary | ICD-10-CM

## 2020-05-29 DIAGNOSIS — D62 Acute posthemorrhagic anemia: Secondary | ICD-10-CM

## 2020-05-29 LAB — CBC WITH DIFFERENTIAL/PLATELET
Abs Immature Granulocytes: 0.12 10*3/uL — ABNORMAL HIGH (ref 0.00–0.07)
Basophils Absolute: 0 10*3/uL (ref 0.0–0.1)
Basophils Relative: 0 %
Eosinophils Absolute: 0.4 10*3/uL (ref 0.0–0.5)
Eosinophils Relative: 3 %
HCT: 23.4 % — ABNORMAL LOW (ref 39.0–52.0)
Hemoglobin: 7.1 g/dL — ABNORMAL LOW (ref 13.0–17.0)
Immature Granulocytes: 1 %
Lymphocytes Relative: 11 %
Lymphs Abs: 1.5 10*3/uL (ref 0.7–4.0)
MCH: 29.6 pg (ref 26.0–34.0)
MCHC: 30.3 g/dL (ref 30.0–36.0)
MCV: 97.5 fL (ref 80.0–100.0)
Monocytes Absolute: 0.8 10*3/uL (ref 0.1–1.0)
Monocytes Relative: 6 %
Neutro Abs: 11.3 10*3/uL — ABNORMAL HIGH (ref 1.7–7.7)
Neutrophils Relative %: 79 %
Platelets: 204 10*3/uL (ref 150–400)
RBC: 2.4 MIL/uL — ABNORMAL LOW (ref 4.22–5.81)
RDW: 18.5 % — ABNORMAL HIGH (ref 11.5–15.5)
WBC: 14.1 10*3/uL — ABNORMAL HIGH (ref 4.0–10.5)
nRBC: 0.2 % (ref 0.0–0.2)

## 2020-05-29 LAB — TYPE AND SCREEN
ABO/RH(D): O POS
Antibody Screen: NEGATIVE
Unit division: 0
Unit division: 0
Unit division: 0
Unit division: 0

## 2020-05-29 LAB — HEMOGLOBIN AND HEMATOCRIT, BLOOD
HCT: 29.6 % — ABNORMAL LOW (ref 39.0–52.0)
Hemoglobin: 9.2 g/dL — ABNORMAL LOW (ref 13.0–17.0)

## 2020-05-29 LAB — BPAM RBC
Blood Product Expiration Date: 202111232359
Blood Product Expiration Date: 202111232359
Blood Product Expiration Date: 202111272359
Blood Product Expiration Date: 202111272359
ISSUE DATE / TIME: 202110261202
ISSUE DATE / TIME: 202110261531
ISSUE DATE / TIME: 202110291834
ISSUE DATE / TIME: 202110300036
Unit Type and Rh: 5100
Unit Type and Rh: 5100
Unit Type and Rh: 5100
Unit Type and Rh: 5100

## 2020-05-29 LAB — BASIC METABOLIC PANEL
Anion gap: 9 (ref 5–15)
BUN: 43 mg/dL — ABNORMAL HIGH (ref 8–23)
CO2: 21 mmol/L — ABNORMAL LOW (ref 22–32)
Calcium: 8.7 mg/dL — ABNORMAL LOW (ref 8.9–10.3)
Chloride: 123 mmol/L — ABNORMAL HIGH (ref 98–111)
Creatinine, Ser: 1.28 mg/dL — ABNORMAL HIGH (ref 0.61–1.24)
GFR, Estimated: >60 mL/min (ref >60)
Glucose, Bld: 129 mg/dL — ABNORMAL HIGH (ref 70–99)
Potassium: 3.9 mmol/L (ref 3.5–5.1)
Sodium: 153 mmol/L — ABNORMAL HIGH (ref 135–145)

## 2020-05-29 LAB — CULTURE, BLOOD (ROUTINE X 2)
Culture: NO GROWTH
Culture: NO GROWTH
Special Requests: ADEQUATE

## 2020-05-29 LAB — MAGNESIUM: Magnesium: 2.3 mg/dL (ref 1.7–2.4)

## 2020-05-29 LAB — GLUCOSE, CAPILLARY
Glucose-Capillary: 130 mg/dL — ABNORMAL HIGH (ref 70–99)
Glucose-Capillary: 148 mg/dL — ABNORMAL HIGH (ref 70–99)
Glucose-Capillary: 123 mg/dL — ABNORMAL HIGH (ref 70–99)
Glucose-Capillary: 141 mg/dL — ABNORMAL HIGH (ref 70–99)
Glucose-Capillary: 91 mg/dL (ref 70–99)

## 2020-05-29 LAB — PREPARE RBC (CROSSMATCH)

## 2020-05-29 LAB — BRAIN NATRIURETIC PEPTIDE: B Natriuretic Peptide: 23.2 pg/mL (ref 0.0–100.0)

## 2020-05-29 LAB — OCCULT BLOOD X 1 CARD TO LAB, STOOL: Fecal Occult Bld: POSITIVE — AB

## 2020-05-29 MED ORDER — DIPHENHYDRAMINE HCL 25 MG PO CAPS
25.0000 mg | ORAL_CAPSULE | Freq: Once | ORAL | Status: AC
  Administered 2020-05-29: 25 mg via ORAL
  Filled 2020-05-29: qty 1

## 2020-05-29 MED ORDER — ACETAMINOPHEN 325 MG PO TABS
650.0000 mg | ORAL_TABLET | Freq: Once | ORAL | Status: AC
  Administered 2020-05-29: 650 mg via ORAL
  Filled 2020-05-29: qty 2

## 2020-05-29 MED ORDER — SODIUM CHLORIDE 0.9% IV SOLUTION: Freq: Once | INTRAVENOUS | Status: AC

## 2020-05-29 NOTE — Progress Notes (Signed)
  Speech Language Pathology Treatment: Dysphagia;Cognitive-Linquistic  Patient Details Name: Alan Everett MRN: [id number] DOB: September 20, 1955 Today's Date: 05/29/2020 Time: 0932-6712 SLP Time Calculation (min) (ACUTE ONLY): 23 min  Assessment / Plan / Recommendation Clinical Impression  Pt demonstrates ongoing improvement. He is fully attentive to PO and making attempts to initiate self feeding with his LUE. There is minimal oral holding with ice chip, and swift swallow resposne with water and puree. Pt was able to achieve straw sips after tactile cues. Throat clearing noted particularly after puree. MBS still recommended for instrumental assessment though radiology not available today. Pt making brief eye contact, and making attempt to articulate unintelligible phrases which are hypernasal but have some variable intonation as a normal phase would. SLP attempted to elicit repetition of single words during functional tasks but pt made no effort to repeat and demonstrated no comprehension of verbal language.   HPI HPI: Alan Everett is a 64 y.o. male with a PMHx of alcohol use and tobacco abuse who presents acutely to the ED via EMS as a Code Stroke for acute onset of right sided weakness. Patient with disoriented, right leg weakness, right limb ataxia, right decreased sensation and Global aphasia on exam. 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 Worsening of intraventricular extension.  MBS 10/19 mod-severe dysphagia, continue NPO. PEG 10/22.       SLP Plan  Continue with current plan of care       Recommendations  Diet recommendations: NPO                Oral Care Recommendations: Oral care QID Follow up Recommendations: Skilled Nursing facility SLP Visit Diagnosis: Dysphagia, oropharyngeal phase (R13.12) Plan: Continue with current plan of care       GO               Harlon Ditty, MA CCC-SLP  Acute Rehabilitation Services Pager [phone] Office  [phone]  Claudine Mouton 05/29/2020, 11:44 AM

## 2020-05-29 NOTE — Progress Notes (Addendum)
Patient ID: Alan Everett, male   DOB: 1956/07/16, 64 y.o.   MRN: [id number]    Progress Note   Subjective   Day # 23  CC; anemia with progression- no overt bleeding  HGB 7.1 post one unit  BUN 43/ Creat 1.28  Negative EGD/ Colon 02/2020  Pt nonverbal   PEG tube feeds     Objective   Vital signs in last 24 hours: Temp:  [97.9 F (36.6 C)-100.1 F (37.8 C)] 97.9 F (36.6 C) (10/30 0828) Pulse Rate:  [108-130] 108 (10/30 0828) Resp:  [17-26] 20 (10/30 0828) BP: (98-152)/(55-80) 110/57 (10/30 0828) SpO2:  [100 %] 100 % (10/30 0828) Weight:  [61.6 kg] 61.6 kg (10/30 0411) Last BM Date: 05/28/20 General:  Older   AA male  in NAD chronically ill-appearing leftward gaze Heart:  Regular rate and rhythm; no murmurs Lungs: Respirations even and unlabored, lungs CTA bilaterally Abdomen:  Soft, nontender and nondistended. Normal bowel sounds.  PEG tube in epigastrium Extremities:  Without edema. Neurologic:  Alert , continuously moving left arm   Lab Results: Recent Labs    05/27/20 0354 05/27/20 0843 05/28/20 0242 05/29/20 0409  WBC 10.3  --  16.8* 14.1*  HGB 7.6*  --  6.6* 7.1*  HCT 25.6* 21.9* 22.9* 23.4*  PLT 215  --  216 204   BMET Recent Labs    05/27/20 0354 05/28/20 0242 05/29/20 0653  NA 156* 156* 153*  K 4.6 4.2 3.9  CL 125* 126* 123*  CO2 23 21* 21*  GLUCOSE 126* 123* 129*  BUN 65* 64* 43*  CREATININE 1.49* 1.56* 1.28*  CALCIUM 8.7* 8.8* 8.7*   LFT Recent Labs    05/28/20 0242  PROT 7.4  ALBUMIN 1.9*  AST 88*  ALT 105*  ALKPHOS 159*  BILITOT 0.7   PT/INR No results for input(s): LABPROT, INR in the last 72 hours.  Studies/Results: CT ABDOMEN PELVIS WO CONTRAST  Result Date: 05/27/2020 CLINICAL DATA:  Abdominal pain, fever EXAM: CT ABDOMEN AND PELVIS WITHOUT CONTRAST TECHNIQUE: Multidetector CT imaging of the abdomen and pelvis was performed following the standard protocol without IV contrast. COMPARISON:  CT abdomen pelvis dated  05/11/2020. FINDINGS: Lower chest: Minimal bibasilar ground-glass opacities are noted, decreased since prior exam. Hepatobiliary: An indistinct hypoattenuating lesion along the falciform ligament measures 2.2 x 2.2 cm and is incompletely characterized on this exam. This does not appear significantly changed since 05/11/2020. A gallstone is noted in the gallbladder neck. No gallbladder wall thickening or biliary dilatation. Pancreas: Unremarkable. No pancreatic ductal dilatation or surrounding inflammatory changes. Spleen: Normal in size without focal abnormality. Adrenals/Urinary Tract: Adrenal glands are unremarkable. Kidneys are normal, without renal calculi, focal lesion, or hydronephrosis. Bladder is distended. Stomach/Bowel: A percutaneous gastrostomy and tube is noted. Enteric contrast reaches the rectum. Appendix appears normal. No evidence of bowel wall thickening, distention, or inflammatory changes. Vascular/Lymphatic: Aortic atherosclerosis. No enlarged abdominal or pelvic lymph nodes. Reproductive: Prostate is unremarkable. Other: A fat containing right inguinal hernia is noted. There is no free intraperitoneal air. Musculoskeletal: No acute or significant osseous findings. IMPRESSION: 1. No findings to explain the patient's symptoms. 2. Cholelithiasis without evidence of acute cholecystitis. Aortic Atherosclerosis (ICD10-I70.0). Electronically Signed   By: Romona Curls M.D.   On: 05/27/2020 15:45   DG Chest Port 1 View  Result Date: 05/28/2020 CLINICAL DATA:  Shortness of breath EXAM: PORTABLE CHEST 1 VIEW COMPARISON:  May 23, 2020 and May 12, 2020 FINDINGS: There is atelectatic change in  the left base. The lungs elsewhere are clear. Heart size and pulmonary vascularity are normal. No adenopathy. There is thoracolumbar levoscoliosis. IMPRESSION: Left base atelectasis.  Lungs otherwise clear.  Heart size normal. Electronically Signed   By: Bretta Bang III M.D.   On: 05/28/2020 09:26    US Abdomen Limited RUQ (LIVER/GB)  Result Date: 05/27/2020 CLINICAL DATA:  Transaminitis EXAM: ULTRASOUND ABDOMEN LIMITED RIGHT UPPER QUADRANT COMPARISON:  CT from same day FINDINGS: Gallbladder: Gallstones are noted. There is no gallbladder wall thickening. The sonographic Eulah Pont sign is negative. There is no pericholecystic free fluid. Common bile duct: Diameter: 3 mm Liver: No focal lesion identified. Within normal limits in parenchymal echogenicity. Portal vein is patent on color Doppler imaging with normal direction of blood flow towards the liver. Other: None. IMPRESSION: There is cholelithiasis without secondary signs of acute cholecystitis. Electronically Signed   By: Katherine Mantle M.D.   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  No overt bleeding. Recent EGD and colonoscopy August 2021 done [facility] with mild gastritis and diverticulosis otherwise unrevealing  May have component of chronic slow blood loss  #3 aspiration pneumonia #4 indistinct falciform ligament lesion 2.2 x 2.2 cm noted on CT last week #5 history of EtOH and substance abuse #6 insulin-dependent diabetes mellitus  Plan; received 1 unit of blood yesterday, to be transfused today Check Hemoccult Consider enteroscopy and possible capsule endoscopy early next week depending on status Daily PPI    LOS: 24 days   Amy Esterwood PA-C 05/29/2020, 11:03 AM   Chart reviewed patient seen and examined as well. He is not able to participate in history.   My sense of this is he probably has anemia of chronic disease and even critical illness so to speak.  Just because he has had a heme positive stool does not mean this is a blood loss anemia and it really does not act like that. I cannot exclude slow blood loss  but really think his anemia is most likely multifactorial.  His iron studies suggest anemia of chronic disease to me.  Note that he has had 8 units of blood since this admission.  His ferritin was elevated and could have been an acute phase reactant I cannot see that he has been given Feraheme anywhere this admission.   Given his overall condition and the general low yield of enteroscopy and capsule endoscopy I am less inclined to pursue this. My sense is the risk-benefit ratio is not in favor of this based upon what I know right now. I do understand his anemia could be a barrier to discharge.  We will see him again tomorrow.  Iva Boop, MD, [facility] 05/29/2020 1:19 PM  [id number]

## 2020-05-29 NOTE — Progress Notes (Signed)
PROGRESS NOTE    Alan Everett  QPY:[id number] DOB: Feb 03, 1956 DOA: 05/05/2020 PCP: Patient, No Pcp Per   Brief Narrative:  64 y.o.malewith a PMHx of alcohol use and tobacco abuse who presented to the ED via EMS as a Code Stroke for acute onset of right sided weakness. he also had sensory loss and weakness involving his RUE and RLE. 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 Neurosurgery was consulted for EVD consideration which was then deferred.  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  Transferred under [name] on 05/09/2020.  On 05/08/2020, patient spiked fever and he was started on broad-spectrum antibiotics/IV Zosyn empirically.  Despite of antibiotics, patient's white blood cells as well as fever continue to get worse with a T-max of 103.2 at around noon on 05/11/2020.  Previous hospitalist Dr. Shelton Silvas had discussed case with ID who recommended scanning his chest and abdomen and continuing antibiotics.  CT abdomen chest and pelvis shows bibasilar infiltrates suspecting possible pneumonia but no other pathology. 2D EchoEF 60-65%. UDS positive for cocaine.  Hospital course complicated by recurrent blood loss anemia.  Therefore GI reconsulted.   Assessment & Plan:   Active Problems:   ICH (intracerebral hemorrhage) (HCC)   Polysubstance abuse (HCC)   Dysphagia, post-stroke   Hyperglycemia   Hypokalemia   Hypomagnesemia   Fever   SOB (shortness of breath)   Leukocytosis   Aspiration pneumonia of both lower lobes ([place])   Palliative care by specialist   Goals of care, counseling/discussion   DNR (do not resuscitate) discussion   Protein-calorie malnutrition, severe   Heme positive stool    Macrocytic anemia, unclear etiology; acutely worsening -Recurrent drop in hemoglobin, this morning 6.6.  Status post transfusion on 10/29 with only improving to 7.1.  Closely  monitor.  Transfuse as necessary. -CT abdomen pelvis without contrast 10/28-no acute pathology -GI reconsulted, plans for push enteroscopy 11/1 along with placement of video capsule -TSH, B12-normal -PPI twice daily  Acute Hemorrhagic Stroke/cerebral edema -Likely in the setting of severe hypertension, alcohol and cocaine use. -Previously was discussed with neurosurgery and neurology. -Echocardiogram EF 60 to 65% -UDS positive for cocaine.  Abdominal Distention Check AXR  Leukocytosis secondary to urinary tract infection -Previously positive for Klebsiella therefore will start patient on Rocephin for 5 days-last day 06/01/2020  Essential hypertension -Borderline low blood pressure-discontinue lisinopril and Norvasc  Hyperlipidemia -LDL less than 70  Mildly elevated AST/ALT/Alk phos/Hypernatremia, persist Hypophosphatemia/hypokalemia, monitor -Right upper quadrant ultrasound-cholelithiasis without acute cholecystitis -Increase frequency of free water flushes to every 4 hours -D5 water.  Repeat lab work daily for now  Moderate to severe dysphagia -PEG tube placed on 05/21/2020. -Monitor for aspiration-currently does not have any cough  Tobacco Abuse -Counseled to quit using this  Cocaine abuse -Counseled to quit using this  GERD -PPI twice daily  DVT prophylaxis: SCD's Start: 05/05/20 1938 Code Status: Full code Family Communication:   Status is: Inpatient  Remains inpatient appropriate because:Inpatient level of care appropriate due to severity of illness   Dispo: The patient is from: Home              Anticipated d/c is to: SNF              Anticipated d/c date is: > 3 days              Patient currently is not medically stable to d/c.  Still confused ongoing evaluation for GI bleed.  Maintain hospital stay.  Body mass index is 21.92 kg/m.     Subjective: Keeps looking at me in the room but no meaningful conversation.  Does not appear to be in any  distress, overall looks comfortable  Review of Systems Otherwise negative except as per HPI, including: Difficult to obtain  Examination: Constitutional: Not in acute distress Respiratory: Minimal bibasilar rhonchi Cardiovascular: Normal sinus rhythm, no rubs Abdomen: Nontender nondistended good bowel sounds Musculoskeletal: No edema noted Skin: No rashes seen Neurologic: Unable to fully assess Psychiatric: Unable to assess PEG tube in place Condom catheter in place  Objective: Vitals:   05/28/20 2208 05/28/20 2338 05/29/20 0403 05/29/20 0411  BP:  118/70 138/67   Pulse: (!) 110 (!) 108 (!) 109   Resp: 20 18 18    Temp:  98.9 F (37.2 C) 98.5 F (36.9 C)   TempSrc:  Oral Axillary   SpO2: 100% 100% 100%   Weight:    61.6 kg    Intake/Output Summary (Last 24 hours) at 05/29/2020 0757 Last data filed at 05/29/2020 0600 Gross per 24 hour  Intake 3816.31 ml  Output 1800 ml  Net 2016.31 ml   Filed Weights   05/26/20 0429 05/27/20 0407 05/29/20 0411  Weight: 60.8 kg 63.4 kg 61.6 kg     Data Reviewed:   CBC: Recent Labs  Lab 05/25/20 0517 05/25/20 0851 05/26/20 0416 05/26/20 0416 05/26/20 1422 05/27/20 0354 05/27/20 0843 05/28/20 0242 05/29/20 0409  WBC 10.6*   < > 11.6*  --  10.8* 10.3  --  16.8* 14.1*  NEUTROABS 8.2*  --  8.6*  --   --  8.2*  --  14.1* 11.3*  HGB 5.5*   < > 9.1*  --  8.6* 7.6*  --  6.6* 7.1*  HCT 19.5*   < > 29.4*   < > 28.8* 25.6* 21.9* 22.9* 23.4*  MCV 104.8*   < > 97.4  --  98.3 100.8*  --  103.6* 97.5  PLT 244   < > 242  --  252 215  --  216 204   < > = values in this interval not displayed.   Basic Metabolic Panel: Recent Labs  Lab 05/24/20 0601 05/25/20 0517 05/26/20 1025 05/27/20 0354 05/28/20 0242 05/29/20 0409  NA 150* 153* 154* 156* 156*  --   K 4.7 4.6 4.4 4.6 4.2  --   CL 117* 120* 123* 125* 126*  --   CO2 22 22 23 23  21*  --   GLUCOSE 134* 140* 139* 126* 123*  --   BUN 74* 84* 76* 65* 64*  --   CREATININE 1.55*  1.71* 1.61* 1.49* 1.56*  --   CALCIUM 9.2 8.8* 9.0 8.7* 8.8*  --   MG  --   --   --  2.7* 2.7* 2.3   GFR: Estimated Creatinine Clearance: 41.7 mL/min (A) (by C-G formula based on SCr of 1.56 mg/dL (H)). Liver Function Tests: Recent Labs  Lab 05/24/20 0601 05/25/20 0517 05/26/20 1025 05/27/20 0354 05/28/20 0242  AST 122* 98* 134* 117* 88*  ALT 147* 114* 140* 121* 105*  ALKPHOS 215* 185* 209* 181* 159*  BILITOT 0.6 0.5 0.7 0.7 0.7  PROT 8.2* 7.1 8.1 7.2 7.4  ALBUMIN 2.4* 2.0* 2.3* 1.9* 1.9*   No results for input(s): LIPASE, AMYLASE in the last 168 hours. No results for input(s): AMMONIA in the last 168 hours. Coagulation Profile: No results for input(s):  INR, PROTIME in the last 168 hours. Cardiac Enzymes: No results for input(s): CKTOTAL, CKMB, CKMBINDEX, TROPONINI in the last 168 hours. BNP (last 3 results) No results for input(s): PROBNP in the last 8760 hours. HbA1C: No results for input(s): HGBA1C in the last 72 hours. CBG: Recent Labs  Lab 05/28/20 1651 05/28/20 2001 05/28/20 2341 05/29/20 0402 05/29/20 0725  GLUCAP 141* 105* 135* 130* 148*   Lipid Profile: No results for input(s): CHOL, HDL, LDLCALC, TRIG, CHOLHDL, LDLDIRECT in the last 72 hours. Thyroid Function Tests: Recent Labs    05/27/20 0843  TSH 1.388   Anemia Panel: Recent Labs    05/27/20 0843  VITAMINB12 690   Sepsis Labs: Recent Labs  Lab 05/27/20 0843  PROCALCITON 0.30    Recent Results (from the past 240 hour(s))  Surgical pcr screen     Status: None   Collection Time: 05/20/20  3:00 AM   Specimen: Nasal Mucosa; Nasal Swab  Result Value Ref Range Status   MRSA, PCR NEGATIVE NEGATIVE Final   Staphylococcus aureus NEGATIVE NEGATIVE Final    Comment: (NOTE) The Xpert SA Assay (FDA approved for NASAL specimens in patients 41 years of age and older), is one component of a comprehensive surveillance program. It is not intended to diagnose infection nor to guide or monitor  treatment. Performed at [facility], [place] [address] [place], [place] [address]   Culture, blood (routine x 2)     Status: None (Preliminary result)   Collection Time: 05/24/20 10:08 AM   Specimen: BLOOD  Result Value Ref Range Status   Specimen Description BLOOD RIGHT ANTECUBITAL  Final   Special Requests   Final    BOTTLES DRAWN AEROBIC ONLY Blood Culture results may not be optimal due to an inadequate volume of blood received in culture bottles   Culture   Final    NO GROWTH 4 DAYS Performed at [facility], [place] [address] [place], [place] [address]    Report Status PENDING  Incomplete  Culture, blood (routine x 2)     Status: None (Preliminary result)   Collection Time: 05/24/20 10:14 AM   Specimen: BLOOD  Result Value Ref Range Status   Specimen Description BLOOD RIGHT ANTECUBITAL  Final   Special Requests   Final    BOTTLES DRAWN AEROBIC AND ANAEROBIC Blood Culture adequate volume   Culture   Final    NO GROWTH 4 DAYS Performed at [facility], [place] [address], [place],  [address]    Report Status PENDING  Incomplete         Radiology Studies: CT ABDOMEN PELVIS WO CONTRAST  Result Date: 05/27/2020 CLINICAL DATA:  Abdominal pain, fever EXAM: CT ABDOMEN AND PELVIS WITHOUT CONTRAST TECHNIQUE: Multidetector CT imaging of the abdomen and pelvis was performed following the standard protocol without IV contrast. COMPARISON:  CT abdomen pelvis dated 05/11/2020. FINDINGS: Lower chest: Minimal bibasilar ground-glass opacities are noted, decreased since prior exam. Hepatobiliary: An indistinct hypoattenuating lesion along the falciform ligament measures 2.2 x 2.2 cm and is incompletely characterized on this exam. This does not appear significantly changed since 05/11/2020. A gallstone is noted in the gallbladder neck. No gallbladder wall thickening or biliary dilatation. Pancreas: Unremarkable. No pancreatic ductal dilatation or surrounding inflammatory  changes. Spleen: Normal in size without focal abnormality. Adrenals/Urinary Tract: Adrenal glands are unremarkable. Kidneys are normal, without renal calculi, focal lesion, or hydronephrosis. Bladder is distended. Stomach/Bowel: A percutaneous gastrostomy and tube is noted. Enteric  contrast reaches the rectum. Appendix appears normal. No evidence of bowel wall thickening, distention, or inflammatory changes. Vascular/Lymphatic: Aortic atherosclerosis. No enlarged abdominal or pelvic lymph nodes. Reproductive: Prostate is unremarkable. Other: A fat containing right inguinal hernia is noted. There is no free intraperitoneal air. Musculoskeletal: No acute or significant osseous findings. IMPRESSION: 1. No findings to explain the patient's symptoms. 2. Cholelithiasis without evidence of acute cholecystitis. Aortic Atherosclerosis (ICD10-I70.0). Electronically Signed   By: Zerita Boers M.D.   On: 05/27/2020 15:45   DG Chest Port 1 View  Result Date: 05/28/2020 CLINICAL DATA:  Shortness of breath EXAM: PORTABLE CHEST 1 VIEW COMPARISON:  May 23, 2020 and May 12, 2020 FINDINGS: There is atelectatic change in the left base. The lungs elsewhere are clear. Heart size and pulmonary vascularity are normal. No adenopathy. There is thoracolumbar levoscoliosis. IMPRESSION: Left base atelectasis.  Lungs otherwise clear.  Heart size normal. Electronically Signed   By: Lowella Grip III M.D.   On: 05/28/2020 09:26   US Abdomen Limited RUQ (LIVER/GB)  Result Date: 05/27/2020 CLINICAL DATA:  Transaminitis EXAM: ULTRASOUND ABDOMEN LIMITED RIGHT UPPER QUADRANT COMPARISON:  CT from same day FINDINGS: Gallbladder: Gallstones are noted. There is no gallbladder wall thickening. The sonographic Percell Miller sign is negative. There is no pericholecystic free fluid. Common bile duct: Diameter: 3 mm Liver: No focal lesion identified. Within normal limits in parenchymal echogenicity. Portal vein is patent on color Doppler imaging  with normal direction of blood flow towards the liver. Other: None. IMPRESSION: There is cholelithiasis without secondary signs of acute cholecystitis. Electronically Signed   By: Constance Holster M.D.   On: 05/27/2020 19:13        Scheduled Meds: . sodium chloride   Intravenous Once  . chlorhexidine  15 mL Mouth Rinse BID  . free water  200 mL Per Tube Q4H  . insulin aspart  0-9 Units Subcutaneous Q4H  . insulin glargine  15 Units Subcutaneous Daily  . mouth rinse  15 mL Mouth Rinse q12n4p  . pantoprazole sodium  40 mg Per Tube BID  . [START ON 05/30/2020] polyethylene glycol powder  0.5 Container Per Tube Once  . senna-docusate  1 tablet Per Tube BID  . sodium chloride flush  10-40 mL Intracatheter Q12H   Continuous Infusions: . cefTRIAXone (ROCEPHIN)  IV Stopped (05/28/20 0901)  . dextrose 50 mL/hr at 05/28/20 2206  . feeding supplement (OSMOLITE 1.2 CAL) 1,000 mL (05/29/20 0517)     LOS: 24 days   Time spent= 35 mins    Elfreida Heggs Arsenio Loader, MD [facility]  If 7PM-7AM, please contact night-coverage  05/29/2020, 7:57 AM

## 2020-05-30 ENCOUNTER — Inpatient Hospital Stay (HOSPITAL_COMMUNITY): Payer: Medicaid Other

## 2020-05-30 DIAGNOSIS — D62 Acute posthemorrhagic anemia: Secondary | ICD-10-CM

## 2020-05-30 LAB — CBC WITH DIFFERENTIAL/PLATELET
Abs Immature Granulocytes: 0.16 10*3/uL — ABNORMAL HIGH (ref 0.00–0.07)
Basophils Absolute: 0.1 10*3/uL (ref 0.0–0.1)
Basophils Relative: 0 %
Eosinophils Absolute: 0.4 10*3/uL (ref 0.0–0.5)
Eosinophils Relative: 3 %
HCT: 29.3 % — ABNORMAL LOW (ref 39.0–52.0)
Hemoglobin: 8.8 g/dL — ABNORMAL LOW (ref 13.0–17.0)
Immature Granulocytes: 1 %
Lymphocytes Relative: 9 %
Lymphs Abs: 1.3 10*3/uL (ref 0.7–4.0)
MCH: 29.1 pg (ref 26.0–34.0)
MCHC: 30 g/dL (ref 30.0–36.0)
MCV: 97 fL (ref 80.0–100.0)
Monocytes Absolute: 0.7 10*3/uL (ref 0.1–1.0)
Monocytes Relative: 5 %
Neutro Abs: 11.3 10*3/uL — ABNORMAL HIGH (ref 1.7–7.7)
Neutrophils Relative %: 82 %
Platelets: 189 10*3/uL (ref 150–400)
RBC: 3.02 MIL/uL — ABNORMAL LOW (ref 4.22–5.81)
RDW: 19.7 % — ABNORMAL HIGH (ref 11.5–15.5)
WBC: 13.9 10*3/uL — ABNORMAL HIGH (ref 4.0–10.5)
nRBC: 0.3 % — ABNORMAL HIGH (ref 0.0–0.2)

## 2020-05-30 LAB — BASIC METABOLIC PANEL
Anion gap: 4 — ABNORMAL LOW (ref 5–15)
BUN: 31 mg/dL — ABNORMAL HIGH (ref 8–23)
CO2: 23 mmol/L (ref 22–32)
Calcium: 8.7 mg/dL — ABNORMAL LOW (ref 8.9–10.3)
Chloride: 123 mmol/L — ABNORMAL HIGH (ref 98–111)
Creatinine, Ser: 1.04 mg/dL (ref 0.61–1.24)
GFR, Estimated: >60 mL/min (ref >60)
Glucose, Bld: 143 mg/dL — ABNORMAL HIGH (ref 70–99)
Potassium: 3.9 mmol/L (ref 3.5–5.1)
Sodium: 150 mmol/L — ABNORMAL HIGH (ref 135–145)

## 2020-05-30 LAB — TYPE AND SCREEN
ABO/RH(D): O POS
Antibody Screen: NEGATIVE
Unit division: 0

## 2020-05-30 LAB — GLUCOSE, CAPILLARY
Glucose-Capillary: 102 mg/dL — ABNORMAL HIGH (ref 70–99)
Glucose-Capillary: 111 mg/dL — ABNORMAL HIGH (ref 70–99)
Glucose-Capillary: 117 mg/dL — ABNORMAL HIGH (ref 70–99)
Glucose-Capillary: 118 mg/dL — ABNORMAL HIGH (ref 70–99)
Glucose-Capillary: 128 mg/dL — ABNORMAL HIGH (ref 70–99)
Glucose-Capillary: 149 mg/dL — ABNORMAL HIGH (ref 70–99)
Glucose-Capillary: 99 mg/dL (ref 70–99)

## 2020-05-30 LAB — BPAM RBC
Blood Product Expiration Date: 202111062359
ISSUE DATE / TIME: 202110301146
Unit Type and Rh: 5100

## 2020-05-30 LAB — MAGNESIUM: Magnesium: 2.2 mg/dL (ref 1.7–2.4)

## 2020-05-30 MED ORDER — OSMOLITE 1.2 CAL PO LIQD
1000.0000 mL | ORAL | Status: DC
  Administered 2020-06-01 – 2020-06-03 (×3): 1000 mL
  Filled 2020-05-30 (×6): qty 1000

## 2020-05-30 MED ORDER — DEXTROSE 5 % IV SOLN: INTRAVENOUS | Status: DC

## 2020-05-30 MED ORDER — OSMOLITE 1.2 CAL PO LIQD: 1000.0000 mL | ORAL | Status: DC

## 2020-05-30 MED ORDER — SODIUM CHLORIDE 0.9 % IV SOLN
510.0000 mg | Freq: Once | INTRAVENOUS | Status: AC
  Administered 2020-05-30: 510 mg via INTRAVENOUS
  Filled 2020-05-30: qty 17

## 2020-05-30 NOTE — Progress Notes (Signed)
Modified Barium Swallow Progress Note  Patient Details  Name: Bralin Garry MRN: [id number] Date of Birth: 05-01-1956  Today's Date: 05/30/2020  Modified Barium Swallow completed.  Full report located under Chart Review in the Imaging Section.  Brief recommendations include the following:  Clinical Impression  Pt presented with improved oropharyngeal swallow.  He was more alert, talking (unintelligibly secondary to aphasia); did not follow commands.  Continues to have left gaze preference; neck was in slight extension during today's study.  He demonstrated improved participation, and was able to orally manipulate barium. There was persisting disorganization with right residue and premature spillage, but function was improved. There was retention of POs in the vallecular and pyrform spaces, but improved laryngeal vestibule closure and only mild penetration of POs (thin/nectar/honey thick liquids) as they collected in the pyriforms and intermittently spilled over the arytenoids, triggering a cough when they reached the vocal folds.  Recommend continued NPO for now. SLP will begin meal trials with pt to determine clinical toleration. Anticipate Mr. Commons will be able to transition to some POs this week.    Swallow Evaluation Recommendations       SLP Diet Recommendations: NPO       Medication Administration: Via alternative means               Oral Care Recommendations: Oral care QID       Mickala Laton L. Samson Frederic, MA CCC/SLP Acute Rehabilitation Services Office number [phone] Pager [phone]  Blenda Mounts Laurice 05/30/2020,1:09 PM

## 2020-05-30 NOTE — Progress Notes (Addendum)
    CBC Latest Ref Rng & Units 05/30/2020 05/29/2020 05/29/2020  WBC 4.0 - 10.5 K/uL 13.9(H) - 14.1(H)  Hemoglobin 13.0 - 17.0 g/dL 1.0(U) 7.2(Z) 7.1(L)  Hematocrit 39 - 52 % 29.3(L) 29.6(L) 23.4(L)  Platelets 150 - 400 K/uL 189 - 204   Hemoglobin has improved with transfusion of 2 units red cells as above.  Stool was heme positive but we have not seen overt signs of bleeding.  Heme positive anemia negative EGD and colonoscopy in the summer, then subsequent stroke intracranial hemorrhage, gastrostomy tube.  Hemoglobin has been decreasing without signs of bleeding.  Ferritin went from low to high question acute phase reactant.  Indices consistent with chronic disease anemia at that point question mixed picture.  Given his overall situation and comorbidities we have decided to hold off on any endoscopic evaluation at this time.  We will dose with Feraheme and follow hemoglobin.  We can proceed with repeat endoscopic evaluation that would include enteroscopy and placement of capsule endoscope (patient cannot swallow this) but will hold off for now.  Discussed with Dr. Nelson Chimes.  We are available but need call back to reassess please  Iva Boop, MD, [facility] 05/30/2020 10:56 AM

## 2020-05-30 NOTE — Progress Notes (Signed)
PROGRESS NOTE    Alan Everett  PTW:[id number] DOB: [birth year] DOA: 05/05/2020 PCP: Patient, No Pcp Per   Brief Narrative:  64 y.o.malewith a PMHx of alcohol use and tobacco abuse who presented to the ED via EMS as a Code Stroke for acute onset of right sided weakness. he also had sensory loss and weakness involving his RUE and RLE. 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 Neurosurgery was consulted for EVD consideration which was then deferred.  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  Transferred under [place] on 05/09/2020.  On 05/08/2020, patient spiked fever and he was started on broad-spectrum antibiotics/IV Zosyn empirically.  Despite of antibiotics, patient's white blood cells as well as fever continue to get worse with a T-max of 103.2 at around noon on 05/11/2020.  Previous hospitalist Dr. Shelton Silvas had discussed case with ID who recommended scanning his chest and abdomen and continuing antibiotics.  CT abdomen chest and pelvis shows bibasilar infiltrates suspecting possible pneumonia but no other pathology. 2D EchoEF 60-65%. UDS positive for cocaine.  Hospital course complicated by recurrent blood loss anemia.  Therefore GI reconsulted.   Assessment & Plan:   Principal Problem:   ICH (intracerebral hemorrhage) (HCC) Active Problems:   Polysubstance abuse (HCC)   Dysphagia, post-stroke   Hyperglycemia   Hypokalemia   Hypomagnesemia   Fever   SOB (shortness of breath)   Leukocytosis   Aspiration pneumonia of both lower lobes ([place])   Palliative care by specialist   Goals of care, counseling/discussion   DNR (do not resuscitate) discussion   Protein-calorie malnutrition, severe   Heme positive stool   Acute lower UTI   Acute blood loss anemia    Macrocytic anemia, unclear etiology; acutely worsening Positive Hemoccult -After multiple transfusion,  patient's hemoglobin is around 8.8.  Hemoccult positive. -IV Feraheme ordered -CT abdomen pelvis without contrast 10/28-no acute pathology -Currently we will hold off on endoscopic evaluation, discussed with Dr. Arelia Longest from GI.  Appreciate his input. -TSH, B12-normal -PPI twice daily  Acute Hemorrhagic Stroke/cerebral edema -Likely in the setting of severe hypertension, alcohol and cocaine use. -Previously was discussed with neurosurgery and neurology. -Echocardiogram EF 60 to 65% -UDS positive for cocaine.  Abdominal Distention No evidence of obstruction seen on the x-ray.  Leukocytosis secondary to urinary tract infection, improving -Previously positive for Klebsiella therefore will start patient on Rocephin for 5 days-last day 06/01/2020  Essential hypertension -Borderline low blood pressure-discontinue lisinopril and Norvasc  Hyperlipidemia -LDL less than 70  Mildly elevated AST/ALT/Alk phos/Hypernatremia, persist Hypophosphatemia/hypokalemia, monitor -Right upper quadrant ultrasound-cholelithiasis without acute cholecystitis -Increase frequency of free water flushes to every 4 hours -Sodium and chloride appears to be improving now on D5 water, will continue this for now and monitor his electrolytes and urine output.  Also monitor for any signs of overload  Moderate to severe dysphagia -PEG tube placed on 05/21/2020. -Monitor for aspiration-currently does not have any cough  Tobacco Abuse -Counseled to quit using this  Cocaine abuse -Counseled to quit using this  GERD -PPI twice daily  Continue providing oral care  DVT prophylaxis: SCD's Start: 05/05/20 1938 Code Status: Full code Family Communication:   Status is: Inpatient  Remains inpatient appropriate because:Inpatient level of care appropriate due to severity of illness   Dispo: The patient is from: Home              Anticipated d/c is  to: SNF              Anticipated d/c date is: > 3 days               Patient currently is not medically stable to d/c.  Still confused ongoing evaluation for GI bleed.  Maintain hospital stay.  Body mass index is 21.92 kg/m.     Subjective: Attempted to say something but difficult to understand his speech.  Not following all the commands.  Grossly moving all extremities.  Review of Systems Otherwise negative except as per HPI, including: Difficult to obtain  Examination: Constitutional: Not in acute distress, appears chronically ill Respiratory: Mild bibasilar rhonchi Cardiovascular: Normal sinus rhythm, no rubs Abdomen: Nontender nondistended good bowel sounds Musculoskeletal: No edema noted Skin: No rashes seen Neurologic: Grossly moving all extremities but unable to assess thoroughly Psychiatric: Unable to assess PEG tube in place Condom catheter in place  Objective: Vitals:   05/29/20 1612 05/29/20 2004 05/30/20 0020 05/30/20 0413  BP: 129/73 123/70 107/67 121/69  Pulse: 99 88 (!) 102 (!) 102  Resp: 20 (!) 22 20 20   Temp: 98.4 F (36.9 C) 97.8 F (36.6 C) 98 F (36.7 C) 99.3 F (37.4 C)  TempSrc: Oral Axillary Axillary Oral  SpO2: 100% 100% 99% 100%  Weight:        Intake/Output Summary (Last 24 hours) at 05/30/2020 0810 Last data filed at 05/30/2020 0600 Gross per 24 hour  Intake 4459.53 ml  Output --  Net 4459.53 ml   Filed Weights   05/26/20 0429 05/27/20 0407 05/29/20 0411  Weight: 60.8 kg 63.4 kg 61.6 kg     Data Reviewed:   CBC: Recent Labs  Lab 05/26/20 0416 05/26/20 0416 05/26/20 1422 05/26/20 1422 05/27/20 0354 05/27/20 0843 05/28/20 0242 05/29/20 0409 05/29/20 2113 05/30/20 0217  WBC 11.6*   < > 10.8*  --  10.3  --  16.8* 14.1*  --  13.9*  NEUTROABS 8.6*  --   --   --  8.2*  --  14.1* 11.3*  --  11.3*  HGB 9.1*   < > 8.6*   < > 7.6*  --  6.6* 7.1* 9.2* 8.8*  HCT 29.4*   < > 28.8*   < > 25.6* 21.9* 22.9* 23.4* 29.6* 29.3*  MCV 97.4   < > 98.3  --  100.8*  --  103.6* 97.5  --  97.0  PLT  242   < > 252  --  215  --  216 204  --  189   < > = values in this interval not displayed.   Basic Metabolic Panel: Recent Labs  Lab 05/26/20 1025 05/27/20 0354 05/28/20 0242 05/29/20 0409 05/29/20 0653 05/30/20 0217  NA 154* 156* 156*  --  153* 150*  K 4.4 4.6 4.2  --  3.9 3.9  CL 123* 125* 126*  --  123* 123*  CO2 23 23 21*  --  21* 23  GLUCOSE 139* 126* 123*  --  129* 143*  BUN 76* 65* 64*  --  43* 31*  CREATININE 1.61* 1.49* 1.56*  --  1.28* 1.04  CALCIUM 9.0 8.7* 8.8*  --  8.7* 8.7*  MG  --  2.7* 2.7* 2.3  --  2.2   GFR: Estimated Creatinine Clearance: 62.5 mL/min (by C-G formula based on SCr of 1.04 mg/dL). Liver Function Tests: Recent Labs  Lab 05/24/20 0601 05/25/20 0517 05/26/20 1025 05/27/20 0354 05/28/20 0242  AST 122* 98*  134* 117* 88*  ALT 147* 114* 140* 121* 105*  ALKPHOS 215* 185* 209* 181* 159*  BILITOT 0.6 0.5 0.7 0.7 0.7  PROT 8.2* 7.1 8.1 7.2 7.4  ALBUMIN 2.4* 2.0* 2.3* 1.9* 1.9*   No results for input(s): LIPASE, AMYLASE in the last 168 hours. No results for input(s): AMMONIA in the last 168 hours. Coagulation Profile: No results for input(s): INR, PROTIME in the last 168 hours. Cardiac Enzymes: No results for input(s): CKTOTAL, CKMB, CKMBINDEX, TROPONINI in the last 168 hours. BNP (last 3 results) No results for input(s): PROBNP in the last 8760 hours. HbA1C: No results for input(s): HGBA1C in the last 72 hours. CBG: Recent Labs  Lab 05/29/20 1539 05/29/20 2003 05/30/20 0014 05/30/20 0437 05/30/20 0733  GLUCAP 141* 91 102* 118* 128*   Lipid Profile: No results for input(s): CHOL, HDL, LDLCALC, TRIG, CHOLHDL, LDLDIRECT in the last 72 hours. Thyroid Function Tests: Recent Labs    05/27/20 0843  TSH 1.388   Anemia Panel: Recent Labs    05/27/20 0843  VITAMINB12 690   Sepsis Labs: Recent Labs  Lab 05/27/20 0843  PROCALCITON 0.30    Recent Results (from the past 240 hour(s))  Culture, blood (routine x 2)     Status:  None   Collection Time: 05/24/20 10:08 AM   Specimen: BLOOD  Result Value Ref Range Status   Specimen Description BLOOD RIGHT ANTECUBITAL  Final   Special Requests   Final    BOTTLES DRAWN AEROBIC ONLY Blood Culture results may not be optimal due to an inadequate volume of blood received in culture bottles   Culture   Final    NO GROWTH 5 DAYS Performed at [facility], [place] [address], [place], [place] [address]    Report Status 05/29/2020 FINAL  Final  Culture, blood (routine x 2)     Status: None   Collection Time: 05/24/20 10:14 AM   Specimen: BLOOD  Result Value Ref Range Status   Specimen Description BLOOD RIGHT ANTECUBITAL  Final   Special Requests   Final    BOTTLES DRAWN AEROBIC AND ANAEROBIC Blood Culture adequate volume   Culture   Final    NO GROWTH 5 DAYS Performed at [facility], [place] [address], [place], [place] [address]    Report Status 05/29/2020 FINAL  Final         Radiology Studies: DG Chest Port 1 View  Result Date: 05/28/2020 CLINICAL DATA:  Shortness of breath EXAM: PORTABLE CHEST 1 VIEW COMPARISON:  May 23, 2020 and May 12, 2020 FINDINGS: There is atelectatic change in the left base. The lungs elsewhere are clear. Heart size and pulmonary vascularity are normal. No adenopathy. There is thoracolumbar levoscoliosis. IMPRESSION: Left base atelectasis.  Lungs otherwise clear.  Heart size normal. Electronically Signed   By: Lowella Grip III M.D.   On: 05/28/2020 09:26   DG Abd Portable 1V  Result Date: 05/29/2020 CLINICAL DATA:  Abdominal distension. EXAM: PORTABLE ABDOMEN - 1 VIEW COMPARISON:  May 20, 2020 FINDINGS: The bowel gas pattern is normal. Residual radiopaque throughout the colon and within the appendix. Interval placement of G-tube, which overlies the mid gastric body. IMPRESSION: 1. Nonobstructive bowel gas pattern. 2. Interval placement of G-tube, which overlies the mid gastric body. Electronically Signed   By:  Fidela Salisbury M.D.   On: 05/29/2020 13:36        Scheduled Meds: . sodium chloride   Intravenous Once  . chlorhexidine  15 mL Mouth Rinse BID  . free water  200 mL Per Tube Q4H  . insulin aspart  0-9 Units Subcutaneous Q4H  . insulin glargine  15 Units Subcutaneous Daily  . mouth rinse  15 mL Mouth Rinse q12n4p  . pantoprazole sodium  40 mg Per Tube BID  . polyethylene glycol powder  0.5 Container Per Tube Once  . senna-docusate  1 tablet Per Tube BID  . sodium chloride flush  10-40 mL Intracatheter Q12H   Continuous Infusions: . cefTRIAXone (ROCEPHIN)  IV Stopped (05/29/20 0900)  . dextrose 50 mL/hr at 05/29/20 1350  . feeding supplement (OSMOLITE 1.2 CAL) 1,000 mL (05/29/20 2347)     LOS: 25 days   Time spent= 35 mins    Betsey Sossamon Arsenio Loader, MD [facility]  If 7PM-7AM, please contact night-coverage  05/30/2020, 8:10 AM

## 2020-05-31 ENCOUNTER — Encounter (HOSPITAL_COMMUNITY)
Admission: EM | Disposition: A | Discharge status: Skilled Nursing Facility | Payer: Self-pay | Source: Home / Self Care | Attending: Internal Medicine

## 2020-05-31 LAB — CBC WITH DIFFERENTIAL/PLATELET
Abs Immature Granulocytes: 0.16 10*3/uL — ABNORMAL HIGH (ref 0.00–0.07)
Basophils Absolute: 0 10*3/uL (ref 0.0–0.1)
Basophils Relative: 0 %
Eosinophils Absolute: 0.4 10*3/uL (ref 0.0–0.5)
Eosinophils Relative: 4 %
HCT: 27.2 % — ABNORMAL LOW (ref 39.0–52.0)
Hemoglobin: 8.5 g/dL — ABNORMAL LOW (ref 13.0–17.0)
Immature Granulocytes: 2 %
Lymphocytes Relative: 15 %
Lymphs Abs: 1.4 10*3/uL (ref 0.7–4.0)
MCH: 30.2 pg (ref 26.0–34.0)
MCHC: 31.3 g/dL (ref 30.0–36.0)
MCV: 96.8 fL (ref 80.0–100.0)
Monocytes Absolute: 0.7 10*3/uL (ref 0.1–1.0)
Monocytes Relative: 7 %
Neutro Abs: 6.9 10*3/uL (ref 1.7–7.7)
Neutrophils Relative %: 72 %
Platelets: 198 10*3/uL (ref 150–400)
RBC: 2.81 MIL/uL — ABNORMAL LOW (ref 4.22–5.81)
RDW: 17.6 % — ABNORMAL HIGH (ref 11.5–15.5)
WBC: 9.7 10*3/uL (ref 4.0–10.5)
nRBC: 0.5 % — ABNORMAL HIGH (ref 0.0–0.2)

## 2020-05-31 LAB — GLUCOSE, CAPILLARY
Glucose-Capillary: 120 mg/dL — ABNORMAL HIGH (ref 70–99)
Glucose-Capillary: 134 mg/dL — ABNORMAL HIGH (ref 70–99)
Glucose-Capillary: 123 mg/dL — ABNORMAL HIGH (ref 70–99)
Glucose-Capillary: 106 mg/dL — ABNORMAL HIGH (ref 70–99)
Glucose-Capillary: 124 mg/dL — ABNORMAL HIGH (ref 70–99)
Glucose-Capillary: 123 mg/dL — ABNORMAL HIGH (ref 70–99)

## 2020-05-31 LAB — BASIC METABOLIC PANEL
Anion gap: 6 (ref 5–15)
BUN: 23 mg/dL (ref 8–23)
CO2: 20 mmol/L — ABNORMAL LOW (ref 22–32)
Calcium: 8.5 mg/dL — ABNORMAL LOW (ref 8.9–10.3)
Chloride: 117 mmol/L — ABNORMAL HIGH (ref 98–111)
Creatinine, Ser: 0.89 mg/dL (ref 0.61–1.24)
GFR, Estimated: >60 mL/min (ref >60)
Glucose, Bld: 135 mg/dL — ABNORMAL HIGH (ref 70–99)
Potassium: 3.7 mmol/L (ref 3.5–5.1)
Sodium: 143 mmol/L (ref 135–145)

## 2020-05-31 LAB — MAGNESIUM: Magnesium: 2.2 mg/dL (ref 1.7–2.4)

## 2020-05-31 SURGERY — ENTEROSCOPY
Anesthesia: Monitor Anesthesia Care

## 2020-05-31 MED ORDER — DIPHENHYDRAMINE HCL 50 MG/ML IJ SOLN: 12.5000 mg | Freq: Three times a day (TID) | INTRAMUSCULAR | Status: AC | PRN

## 2020-05-31 MED ORDER — POTASSIUM CHLORIDE 10 MEQ/100ML IV SOLN
10.0000 meq | INTRAVENOUS | Status: AC
  Administered 2020-05-31: 10 meq via INTRAVENOUS
  Filled 2020-05-31 (×2): qty 100

## 2020-05-31 MED ORDER — POTASSIUM CHLORIDE 10 MEQ/100ML IV SOLN
10.0000 meq | Freq: Once | INTRAVENOUS | Status: AC
  Administered 2020-05-31: 10 meq via INTRAVENOUS

## 2020-05-31 NOTE — Progress Notes (Signed)
Physical Therapy Treatment Patient Details Name: Alan Everett MRN: [id number] DOB: 1956/05/31 Today's Date: 05/31/2020    History of Present Illness Patient is a 64 y/o male with PMH ETOH use, tobacco abuse, admitted with R side weakness,sensory loss and speech deficits.  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    PT Comments    Pt soiled in stool upon arrival to room, requires very extended time for performing bed mobility tasks for pericare given difficulty with command following and active resistance to rolling torwards R. Pt with limited tolerance for EOB sitting tasks today secondary to fatigue. Will continue to follow acutely and will progress mobility as able.    Follow Up Recommendations  SNF;Supervision/Assistance - 24 hour     Equipment Recommendations  Wheelchair cushion (measurements PT);Wheelchair (measurements PT);Hospital bed;Other (comment)    Recommendations for Other Services       Precautions / Restrictions Precautions Precautions: Fall Precaution Comments: L handmitt, R pushing, G tube Restrictions Weight Bearing Restrictions: No    Mobility  Bed Mobility Overal bed mobility: Needs Assistance Bed Mobility: Supine to Sit;Sit to Supine Rolling: Mod assist;+2 for safety/equipment   Supine to sit: Total assist;+2 for physical assistance Sit to supine: Total assist;+2 for physical assistance   General bed mobility comments: Mod assist for rolling bilaterally for trunk and LE management, pt actively resisting roll towards L by swatting at and grabbing PT's forearm. total +2 for supine<> for all aspects  Transfers                 General transfer comment: nt  Ambulation/Gait                 Stairs             Wheelchair Mobility    Modified Rankin (Stroke Patients Only) Modified Rankin (Stroke Patients Only) Pre-Morbid Rankin Score: No symptoms Modified Rankin:  Severe disability     Balance Overall balance assessment: Needs assistance Sitting-balance support: No upper extremity supported;Feet supported;Single extremity supported Sitting balance-Leahy Scale: Poor Sitting balance - Comments: EOB sitting x5 minutes, AP/lateral perturbations. Resistant to R lateral leaning Postural control: Posterior lean   Standing balance-Leahy Scale: Zero                              Cognition Arousal/Alertness: Awake/alert Behavior During Therapy: Restless;Agitated Overall Cognitive Status: Difficult to assess                                 General Comments: Pt following commands inconsistently today, attempting to look towards R when cued and verbally responds yes/no questions if PT asks. Pt restless during pericare, especially when rolling towards pt R      Exercises      General Comments General comments (skin integrity, edema, etc.): pericare and linen change with assist of NT      Pertinent Vitals/Pain Pain Assessment: Faces Faces Pain Scale: Hurts little more Pain Location: RLE during ROM/mobility Pain Descriptors / Indicators: Grimacing;Restless Pain Intervention(s): Limited activity within patient's tolerance;Monitored during session;Repositioned    Home Living                      Prior Function            PT Goals (current goals can now be found in the  care plan section) Acute Rehab PT Goals Patient Stated Goal: unable to state PT Goal Formulation: Patient unable to participate in goal setting Time For Goal Achievement: 06/03/20 Potential to Achieve Goals: Fair    Frequency    Min 3X/week      PT Plan Current plan remains appropriate    Co-evaluation              AM-PAC PT "6 Clicks" Mobility   Outcome Measure  Help needed turning from your back to your side while in a flat bed without using bedrails?: Total Help needed moving from lying on your back to sitting on the side  of a flat bed without using bedrails?: Total Help needed moving to and from a bed to a chair (including a wheelchair)?: Total Help needed standing up from a chair using your arms (e.g., wheelchair or bedside chair)?: Total Help needed to walk in hospital room?: Total Help needed climbing 3-5 steps with a railing? : Total 6 Click Score: 6    End of Session   Activity Tolerance: Patient limited by fatigue Patient left: in bed;with bed alarm set;with call bell/phone within reach;with restraints reapplied (bilat hand mitts) Nurse Communication: Mobility status PT Visit Diagnosis: Unsteadiness on feet (R26.81);Muscle weakness (generalized) (M62.81);Difficulty in walking, not elsewhere classified (R26.2);Other symptoms and signs involving the nervous system (R29.898);Hemiplegia and hemiparesis Hemiplegia - Right/Left: Right Hemiplegia - dominant/non-dominant: Dominant Hemiplegia - caused by: Nontraumatic intracerebral hemorrhage     Time: 1440-1508 PT Time Calculation (min) (ACUTE ONLY): 28 min  Charges:  $Therapeutic Activity: 8-22 mins $Self Care/Home Management: 8-22                     Richrd Sox, PT Acute Rehabilitation Services Pager [phone]  Office [phone]     Tyrone Apple D Despina Hidden 05/31/2020, 5:25 PM

## 2020-05-31 NOTE — Progress Notes (Signed)
PROGRESS NOTE    Alan Everett  YKD:[id number] DOB: [birth year] DOA: 05/05/2020 PCP: Patient, No Pcp Per   Brief Narrative:  64 y.o.malewith a PMHx of alcohol use and tobacco abuse who presented to the ED via EMS as a Code Stroke for acute onset of right sided weakness. he also had sensory loss and weakness involving his RUE and RLE. 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 Neurosurgery was consulted for EVD consideration which was then deferred.  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  Transferred under [place] on 05/09/2020.  On 05/08/2020, patient spiked fever and he was started on broad-spectrum antibiotics/IV Zosyn empirically.  Despite of antibiotics, patient's white blood cells as well as fever continue to get worse with a T-max of 103.2 at around noon on 05/11/2020.  Previous hospitalist Dr. Shelton Silvas had discussed case with ID who recommended scanning his chest and abdomen and continuing antibiotics.  CT abdomen chest and pelvis shows bibasilar infiltrates suspecting possible pneumonia but no other pathology. 2D EchoEF 60-65%. UDS positive for cocaine.  Hospital course complicated by recurrent blood loss anemia.  Therefore GI reconsulted.   Assessment & Plan:   Principal Problem:   ICH (intracerebral hemorrhage) (HCC) Active Problems:   Polysubstance abuse (HCC)   Dysphagia, post-stroke   Hyperglycemia   Hypokalemia   Hypomagnesemia   Fever   SOB (shortness of breath)   Leukocytosis   Aspiration pneumonia of both lower lobes ([place])   Palliative care by specialist   Goals of care, counseling/discussion   DNR (do not resuscitate) discussion   Protein-calorie malnutrition, severe   Heme positive stool   Acute lower UTI   Acute blood loss anemia    Macrocytic anemia, unclear etiology; acutely worsening Positive Hemoccult -After multiple transfusion, Hb  today 8.5. Hemoccult positive. Monitor for now. -IV Feraheme administered 10/31 -CT abdomen pelvis without contrast 10/28-no acute pathology -Currently we will hold off on endoscopic evaluation, discussed with Dr. Carlean Purl from GI.  Appreciate his input. -TSH, B12-normal -PPI twice daily  Acute Hemorrhagic Stroke/cerebral edema -Likely in the setting of severe hypertension, alcohol and cocaine use. -Previously was discussed with neurosurgery and neurology. -Echocardiogram EF 60 to 65% -UDS positive for cocaine.  Abdominal Distention No evidence of obstruction seen on the x-ray.  Leukocytosis secondary to urinary tract infection, improving -Previously positive for Klebsiella therefore will start patient on Rocephin for 5 days-last day 06/01/2020  Essential hypertension -Borderline low blood pressure-discontinue lisinopril and Norvasc  Hyperlipidemia -LDL less than 70  Mildly elevated AST/ALT/Alk phos/Hypernatremia, persist Hypophosphatemia/hypokalemia, monitor -Right upper quadrant ultrasound-cholelithiasis without acute cholecystitis -free water flushes to every 4 hours -Stop D5W, na levels improved.   Moderate to severe dysphagia -PEG tube placed on 05/21/2020. -Monitor for aspiration-currently does not have any cough  Tobacco Abuse -Counseled to quit using this  Cocaine abuse -Counseled to quit using this  GERD -PPI twice daily  Continue providing oral care  DVT prophylaxis: SCD's Start: 05/05/20 1938 Code Status: Full code Family Communication:   Status is: Inpatient  Remains inpatient appropriate because:Inpatient level of care appropriate due to severity of illness   Dispo: The patient is from: Home              Anticipated d/c is to: SNF              Anticipated d/c date is: > 3 days  Patient currently is not medically stable to d/c.  Continue to monitor his Hb at this time. Not safe to perform EGD. Difficult placement in the meantime.  TOC team aware.   Body mass index is 21.92 kg/m.     Subjective: Laying in the bed, no complaints. Most nonverbal. Laying with his eyes open and able to track me across the room. Not in acute distress.   Review of Systems Otherwise negative except as per HPI, including: Difficult to obtain  Examination: Constitutional: Not in acute distress Respiratory: Clear to auscultation bilaterally Cardiovascular: Normal sinus rhythm, no rubs Abdomen: Nontender nondistended good bowel sounds. Peg in place.  Musculoskeletal: No edema noted Skin: No rashes seen Neurologic: grossly moving all the extremities  Psychiatric: unable to assess   PEG tube in place Condom catheter in place  Objective: Vitals:   05/30/20 1536 05/30/20 1953 05/30/20 2354 05/31/20 0405  BP: (!) 143/71 133/74 130/74 139/71  Pulse: (!) 110 (!) 102 (!) 105 96  Resp: 18 (!) _0 Temp: 99.1 F (37.3 C) 98.8 F (37.1 C) 98.4 F (36.9 C) 98.3 F (36.8 C)  TempSrc: Oral Oral Oral Oral  SpO2: 100% 100% 100% 100%  Weight:        Intake/Output Summary (Last 24 hours) at 05/31/2020 0909 Last data filed at 05/31/2020 0600 Gross per 24 hour  Intake 3270.47 ml  Output 1300 ml  Net 1970.47 ml   Filed Weights   05/26/20 0429 05/27/20 0407 05/29/20 0411  Weight: 60.8 kg 63.4 kg 61.6 kg     Data Reviewed:   CBC: Recent Labs  Lab 05/27/20 0354 05/27/20 0843 05/28/20 0242 05/29/20 0409 05/29/20 2113 05/30/20 0217 05/31/20 0243  WBC 10.3  --  16.8* 14.1*  --  13.9* 9.7  NEUTROABS 8.2*  --  14.1* 11.3*  --  11.3* 6.9  HGB 7.6*  --  6.6* 7.1* 9.2* 8.8* 8.5*  HCT 25.6*   < > 22.9* 23.4* 29.6* 29.3* 27.2*  MCV 100.8*  --  103.6* 97.5  --  97.0 96.8  PLT 215  --  216 204  --  189 198   < > = values in this interval not displayed.   Basic Metabolic Panel: Recent Labs  Lab 05/27/20 0354 05/28/20 0242 05/29/20 0409 05/29/20 0653 05/30/20 0217 05/31/20 0243  NA 156* 156*  --  153* 150* 143  K 4.6  4.2  --  3.9 3.9 3.7  CL 125* 126*  --  123* 123* 117*  CO2 23 21*  --  21* 23 20*  GLUCOSE 126* 123*  --  129* 143* 135*  BUN 65* 64*  --  43* 31* 23  CREATININE 1.49* 1.56*  --  1.28* 1.04 0.89  CALCIUM 8.7* 8.8*  --  8.7* 8.7* 8.5*  MG 2.7* 2.7* 2.3  --  2.2 2.2   GFR: Estimated Creatinine Clearance: 73.1 mL/min (by C-G formula based on SCr of 0.89 mg/dL). Liver Function Tests: Recent Labs  Lab 05/25/20 0517 05/26/20 1025 05/27/20 0354 05/28/20 0242  AST 98* 134* 117* 88*  ALT 114* 140* 121* 105*  ALKPHOS 185* 209* 181* 159*  BILITOT 0.5 0.7 0.7 0.7  PROT 7.1 8.1 7.2 7.4  ALBUMIN 2.0* 2.3* 1.9* 1.9*   No results for input(s): LIPASE, AMYLASE in the last 168 hours. No results for input(s): AMMONIA in the last 168 hours. Coagulation Profile: No results for input(s): INR, PROTIME in the last 168 hours. Cardiac Enzymes: No results for  input(s): CKTOTAL, CKMB, CKMBINDEX, TROPONINI in the last 168 hours. BNP (last 3 results) No results for input(s): PROBNP in the last 8760 hours. HbA1C: No results for input(s): HGBA1C in the last 72 hours. CBG: Recent Labs  Lab 05/30/20 1511 05/30/20 1954 05/30/20 2355 05/31/20 0410 05/31/20 0749  GLUCAP 149* 111* 117* 120* 134*   Lipid Profile: No results for input(s): CHOL, HDL, LDLCALC, TRIG, CHOLHDL, LDLDIRECT in the last 72 hours. Thyroid Function Tests: No results for input(s): TSH, T4TOTAL, FREET4, T3FREE, THYROIDAB in the last 72 hours. Anemia Panel: No results for input(s): VITAMINB12, FOLATE, FERRITIN, TIBC, IRON, RETICCTPCT in the last 72 hours. Sepsis Labs: Recent Labs  Lab 05/27/20 0843  PROCALCITON 0.30    Recent Results (from the past 240 hour(s))  Culture, blood (routine x 2)     Status: None   Collection Time: 05/24/20 10:08 AM   Specimen: BLOOD  Result Value Ref Range Status   Specimen Description BLOOD RIGHT ANTECUBITAL  Final   Special Requests   Final    BOTTLES DRAWN AEROBIC ONLY Blood Culture  results may not be optimal due to an inadequate volume of blood received in culture bottles   Culture   Final    NO GROWTH 5 DAYS Performed at [facility], [place] [address] [place], [place] [address]    Report Status 05/29/2020 FINAL  Final  Culture, blood (routine x 2)     Status: None   Collection Time: 05/24/20 10:14 AM   Specimen: BLOOD  Result Value Ref Range Status   Specimen Description BLOOD RIGHT ANTECUBITAL  Final   Special Requests   Final    BOTTLES DRAWN AEROBIC AND ANAEROBIC Blood Culture adequate volume   Culture   Final    NO GROWTH 5 DAYS Performed at [facility], [place] [address], [place], [place] [address]    Report Status 05/29/2020 FINAL  Final         Radiology Studies: DG Abd Portable 1V  Result Date: 05/29/2020 CLINICAL DATA:  Abdominal distension. EXAM: PORTABLE ABDOMEN - 1 VIEW COMPARISON:  May 20, 2020 FINDINGS: The bowel gas pattern is normal. Residual radiopaque throughout the colon and within the appendix. Interval placement of G-tube, which overlies the mid gastric body. IMPRESSION: 1. Nonobstructive bowel gas pattern. 2. Interval placement of G-tube, which overlies the mid gastric body. Electronically Signed   By: Fidela Salisbury M.D.   On: 05/29/2020 13:36   DG Swallowing Func-Speech Pathology  Result Date: 05/30/2020 Objective Swallowing Evaluation: Type of Study: MBS-Modified Barium Swallow Study  Patient Details Name: Alan Everett MRN: [id number] Date of Birth: [birth year] Today's Date: 05/30/2020 Time: SLP Start Time (ACUTE ONLY): 59 -SLP Stop Time (ACUTE ONLY): 1200 SLP Time Calculation (min) (ACUTE ONLY): 30 min Past Medical History: Past Medical History: Diagnosis Date . Alcohol use  . Tobacco abuse  Past Surgical History: Past Surgical History: Procedure Laterality Date . COLONOSCOPY N/A 03/01/2020  Procedure: COLONOSCOPY;  Surgeon: Toledo, Benay Pike, MD;  Location: [facility] ENDOSCOPY;  Service: Gastroenterology;  Laterality: N/A;  . ESOPHAGOGASTRODUODENOSCOPY N/A 03/01/2020  Procedure: ESOPHAGOGASTRODUODENOSCOPY (EGD);  Surgeon: Toledo, Benay Pike, MD;  Location: [facility] ENDOSCOPY;  Service: Gastroenterology;  Laterality: N/A; . FOOT FRACTURE SURGERY Left  . IR GASTROSTOMY TUBE MOD SED  05/21/2020 HPI: Alan Everett is a 64 y.o. male with a PMHx of alcohol use and tobacco abuse who presents acutely to the ED via EMS as a Code Stroke for acute onset of right sided weakness. Patient  with disoriented, right leg weakness, right limb ataxia, right decreased sensation and Global aphasia on exam. 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 Worsening of intraventricular extension.  MBS 10/19 mod-severe dysphagia, continue NPO. PEG 10/22.  Subjective: alert Assessment / Plan / Recommendation CHL IP CLINICAL IMPRESSIONS 05/30/2020 Clinical Impression Pt presented with improved oropharyngeal swallow.  He was more alert, talking (unintelligibly secondary to aphasia); did not follow commands.  Continues to have left gaze preference; neck was in slight extension during today's study.  He demonstrated improved participation, and was able to orally manipulate barium. There was persisting disorganization with right residue and premature spillage, but function was improved. There was retention of POs in the vallecular and pyrform spaces, but improved laryngeal vestibule closure and only mild penetration of POs (thin/nectar/honey thick liquids) as they collected in the pyriforms and intermittently spilled over the arytenoids, triggering a cough when they reached the vocal folds.  Recommend continued NPO for now. SLP will begin meal trials with pt to determine clinical toleration. Anticipate Mr. Disano will be able to transition to some POs this week.  SLP Visit Diagnosis Dysphagia, oropharyngeal phase (R13.12) Attention and concentration deficit following -- Frontal lobe and executive function deficit following -- Impact on safety and function Moderate  aspiration risk   CHL IP TREATMENT RECOMMENDATION 05/30/2020 Treatment Recommendations Therapy as outlined in treatment plan below   Prognosis 05/30/2020 Prognosis for Safe Diet Advancement Good Barriers to Reach Goals Language deficits Barriers/Prognosis Comment -- CHL IP DIET RECOMMENDATION 05/30/2020 SLP Diet Recommendations NPO Liquid Administration via -- Medication Administration Via alternative means Compensations -- Postural Changes --   CHL IP OTHER RECOMMENDATIONS 05/30/2020 Recommended Consults -- Oral Care Recommendations Oral care QID Other Recommendations --   CHL IP FOLLOW UP RECOMMENDATIONS 05/30/2020 Follow up Recommendations Skilled Nursing facility   [facility] IP FREQUENCY AND DURATION 05/30/2020 Speech Therapy Frequency (ACUTE ONLY) min 2x/week Treatment Duration 2 weeks      CHL IP ORAL PHASE 05/30/2020 Oral Phase Impaired Oral - Pudding Teaspoon -- Oral - Pudding Cup -- Oral - Honey Teaspoon Right pocketing in lateral sulci;Lingual/palatal residue;Piecemeal swallowing;Decreased bolus cohesion;Premature spillage Oral - Honey Cup -- Oral - Nectar Teaspoon Right pocketing in lateral sulci;Lingual/palatal residue;Piecemeal swallowing;Decreased bolus cohesion;Premature spillage Oral - Nectar Cup -- Oral - Nectar Straw -- Oral - Thin Teaspoon Right pocketing in lateral sulci;Lingual/palatal residue;Piecemeal swallowing;Decreased bolus cohesion;Premature spillage Oral - Thin Cup Right pocketing in lateral sulci;Lingual/palatal residue;Piecemeal swallowing;Decreased bolus cohesion;Premature spillage Oral - Thin Straw -- Oral - Puree Right pocketing in lateral sulci;Lingual/palatal residue;Piecemeal swallowing;Decreased bolus cohesion Oral - Mech Soft -- Oral - Regular -- Oral - Multi-Consistency -- Oral - Pill -- Oral Phase - Comment --  CHL IP PHARYNGEAL PHASE 05/30/2020 Pharyngeal Phase Impaired Pharyngeal- Pudding Teaspoon -- Pharyngeal -- Pharyngeal- Pudding Cup -- Pharyngeal -- Pharyngeal- Honey  Teaspoon Delayed swallow initiation-vallecula;Delayed swallow initiation-pyriform sinuses;Pharyngeal residue - valleculae;Pharyngeal residue - pyriform Pharyngeal -- Pharyngeal- Honey Cup -- Pharyngeal -- Pharyngeal- Nectar Teaspoon Delayed swallow initiation-vallecula;Delayed swallow initiation-pyriform sinuses;Pharyngeal residue - valleculae;Pharyngeal residue - pyriform;Penetration/Apiration after swallow Pharyngeal Material enters airway, passes BELOW cords without attempt by patient to eject out (silent aspiration) Pharyngeal- Nectar Cup -- Pharyngeal -- Pharyngeal- Nectar Straw -- Pharyngeal -- Pharyngeal- Thin Teaspoon Delayed swallow initiation-pyriform sinuses;Penetration/Apiration after swallow;Pharyngeal residue - valleculae;Pharyngeal residue - pyriform Pharyngeal Material enters airway, passes BELOW cords and not ejected out despite cough attempt by patient Pharyngeal- Thin Cup NT Pharyngeal -- Pharyngeal- Thin Straw -- Pharyngeal -- Pharyngeal- Puree Delayed swallow  initiation-vallecula;Reduced pharyngeal peristalsis;Pharyngeal residue - valleculae;Pharyngeal residue - pyriform Pharyngeal -- Pharyngeal- Mechanical Soft -- Pharyngeal -- Pharyngeal- Regular -- Pharyngeal -- Pharyngeal- Multi-consistency -- Pharyngeal -- Pharyngeal- Pill -- Pharyngeal -- Pharyngeal Comment --  No flowsheet data found. Juan Quam Laurice 05/30/2020, 1:11 PM                   Scheduled Meds: . chlorhexidine  15 mL Mouth Rinse BID  . feeding supplement (OSMOLITE 1.2 CAL)  1,000 mL Per Tube Q24H  . free water  200 mL Per Tube Q4H  . insulin aspart  0-9 Units Subcutaneous Q4H  . insulin glargine  15 Units Subcutaneous Daily  . mouth rinse  15 mL Mouth Rinse q12n4p  . pantoprazole sodium  40 mg Per Tube BID  . polyethylene glycol powder  0.5 Container Per Tube Once  . senna-docusate  1 tablet Per Tube BID  . sodium chloride flush  10-40 mL Intracatheter Q12H   Continuous Infusions: . cefTRIAXone  (ROCEPHIN)  IV Stopped (05/30/20 1122)     LOS: 26 days   Time spent= 35 mins     Arsenio Loader, MD [facility]  If 7PM-7AM, please contact night-coverage  05/31/2020, 9:09 AM

## 2020-06-01 LAB — GLUCOSE, CAPILLARY
Glucose-Capillary: 122 mg/dL — ABNORMAL HIGH (ref 70–99)
Glucose-Capillary: 130 mg/dL — ABNORMAL HIGH (ref 70–99)
Glucose-Capillary: 151 mg/dL — ABNORMAL HIGH (ref 70–99)
Glucose-Capillary: 123 mg/dL — ABNORMAL HIGH (ref 70–99)
Glucose-Capillary: 139 mg/dL — ABNORMAL HIGH (ref 70–99)
Glucose-Capillary: 109 mg/dL — ABNORMAL HIGH (ref 70–99)

## 2020-06-01 LAB — CBC WITH DIFFERENTIAL/PLATELET
Abs Immature Granulocytes: 0.36 10*3/uL — ABNORMAL HIGH (ref 0.00–0.07)
Basophils Absolute: 0 10*3/uL (ref 0.0–0.1)
Basophils Relative: 0 %
Eosinophils Absolute: 0.4 10*3/uL (ref 0.0–0.5)
Eosinophils Relative: 5 %
HCT: 29.4 % — ABNORMAL LOW (ref 39.0–52.0)
Hemoglobin: 8.9 g/dL — ABNORMAL LOW (ref 13.0–17.0)
Immature Granulocytes: 4 %
Lymphocytes Relative: 14 %
Lymphs Abs: 1.3 10*3/uL (ref 0.7–4.0)
MCH: 29.3 pg (ref 26.0–34.0)
MCHC: 30.3 g/dL (ref 30.0–36.0)
MCV: 96.7 fL (ref 80.0–100.0)
Monocytes Absolute: 0.7 10*3/uL (ref 0.1–1.0)
Monocytes Relative: 7 %
Neutro Abs: 6.5 10*3/uL (ref 1.7–7.7)
Neutrophils Relative %: 70 %
Platelets: 201 10*3/uL (ref 150–400)
RBC: 3.04 MIL/uL — ABNORMAL LOW (ref 4.22–5.81)
RDW: 16.7 % — ABNORMAL HIGH (ref 11.5–15.5)
WBC: 9.3 10*3/uL (ref 4.0–10.5)
nRBC: 0.6 % — ABNORMAL HIGH (ref 0.0–0.2)

## 2020-06-01 LAB — BASIC METABOLIC PANEL
Anion gap: 8 (ref 5–15)
BUN: 17 mg/dL (ref 8–23)
CO2: 20 mmol/L — ABNORMAL LOW (ref 22–32)
Calcium: 8.6 mg/dL — ABNORMAL LOW (ref 8.9–10.3)
Chloride: 112 mmol/L — ABNORMAL HIGH (ref 98–111)
Creatinine, Ser: 0.74 mg/dL (ref 0.61–1.24)
GFR, Estimated: >60 mL/min (ref >60)
Glucose, Bld: 110 mg/dL — ABNORMAL HIGH (ref 70–99)
Potassium: 4.1 mmol/L (ref 3.5–5.1)
Sodium: 140 mmol/L (ref 135–145)

## 2020-06-01 LAB — MAGNESIUM: Magnesium: 1.9 mg/dL (ref 1.7–2.4)

## 2020-06-01 MED ORDER — RESOURCE THICKENUP CLEAR PO POWD
Freq: Once | ORAL | Status: AC
  Filled 2020-06-01: qty 125

## 2020-06-01 NOTE — Progress Notes (Signed)
PROGRESS NOTE    Styles Fambro  ZLD:[id number] DOB: [birth year] DOA: 05/05/2020 PCP: Patient, No Pcp Per   Brief Narrative:  64 y.o.malewith a PMHx of alcohol use and tobacco abuse who presented to the ED via EMS as a Code Stroke for acute onset of right sided weakness. he also had sensory loss and weakness involving his RUE and RLE. 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 Neurosurgery was consulted for EVD consideration which was then deferred.  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  Transferred under [facility] on 05/09/2020.  On 05/08/2020, patient spiked fever and he was started on broad-spectrum antibiotics/IV Zosyn empirically.  Despite of antibiotics, patient's white blood cells as well as fever continue to get worse with a T-max of 103.2 at around noon on 05/11/2020.  Previous hospitalist Dr. Shelton Silvas had discussed case with ID who recommended scanning his chest and abdomen and continuing antibiotics.  CT abdomen chest and pelvis shows bibasilar infiltrates suspecting possible pneumonia but no other pathology. 2D EchoEF 60-65%. UDS positive for cocaine.  Hospital course complicated by recurrent blood loss anemia.  Therefore GI reconsulted-but still recommending conservative management for now.  IV iron given.   Assessment & Plan:   Principal Problem:   ICH (intracerebral hemorrhage) (HCC) Active Problems:   Polysubstance abuse (HCC)   Dysphagia, post-stroke   Hyperglycemia   Hypokalemia   Hypomagnesemia   Fever   SOB (shortness of breath)   Leukocytosis   Aspiration pneumonia of both lower lobes ([place])   Palliative care by specialist   Goals of care, counseling/discussion   DNR (do not resuscitate) discussion   Protein-calorie malnutrition, severe   Heme positive stool   Acute lower UTI   Acute blood loss anemia    Macrocytic anemia, unclear etiology;  acutely worsening Positive Hemoccult -After multiple transfusion, hemoglobin today 8.9.. Hemoccult positive. Monitor for now. -Status post IV Feraheme 10/31. -CT abdomen pelvis without contrast 10/28-no acute pathology -Currently we will hold off on endoscopic evaluation, discussed with Dr. Carlean Purl from GI.  Appreciate his input. -TSH, B12-normal -PPI twice daily  Acute Hemorrhagic Stroke/cerebral edema -Likely in the setting of severe hypertension, alcohol and cocaine use. -Previously was discussed with neurosurgery and neurology. -Echocardiogram EF 60 to 65% -UDS positive for cocaine.  Abdominal Distention No evidence of obstruction seen on the x-ray.  Leukocytosis secondary to urinary tract infection, improving -Last day of Rocephin today to complete 5-day course.  Essential hypertension -Borderline low blood pressure-discontinue lisinopril and Norvasc  Hyperlipidemia -LDL less than 70  Mildly elevated AST/ALT/Alk phos/Hypernatremia, persist Hypophosphatemia/hypokalemia, monitor -Right upper quadrant ultrasound-cholelithiasis without acute cholecystitis -Sodium levels have continued to improve with increasing frequency of free water flushes and D5 water.  D5 water discontinued.  Monitor electrolytes.  Moderate to severe dysphagia -PEG tube placed on 05/21/2020. -Monitor for aspiration-currently does not have any cough  Tobacco Abuse -Counseled to quit using this  Cocaine abuse -Counseled to quit using this  GERD -PPI twice daily  Continue providing oral care  DVT prophylaxis: SCD's Start: 05/05/20 1938 Code Status: Full code Family Communication:   Status is: Inpatient  Remains inpatient appropriate because:Inpatient level of care appropriate due to severity of illness   Dispo: The patient is from: Home              Anticipated d/c is to: SNF  Anticipated d/c date is: > 3 days              Patient currently is not medically stable to  d/c.  Continue to monitor his Hb at this time. Not safe to perform EGD. Difficult placement in the meantime. TOC team aware.   Body mass index is 23.24 kg/m.     Subjective: Laying in the bed no complaints at this time.  Difficult to obtain much information but does not appear to be any acute distress  Review of Systems Otherwise negative except as per HPI, including: Difficult to obtain  Examination: Constitutional: Not in acute distress, feeding tube in place.  Remains nonverbal.  Mumbles words Respiratory: Clear to auscultation bilaterally Cardiovascular: Normal sinus rhythm, no rubs Abdomen: Nontender nondistended good bowel sounds Musculoskeletal: No edema noted Skin: No rashes seen Neurologic: Unable to assess but grossly moving all 4 extremities Psychiatric: Unable to assess  PEG tube in place Condom catheter in place  Objective: Vitals:   05/31/20 1958 05/31/20 2324 06/01/20 0314 06/01/20 0738  BP: (!) 143/81 140/86 138/77 138/73  Pulse: 91 89 93 95  Resp: _0 Temp: 98 F (36.7 C) 98.1 F (36.7 C) 98 F (36.7 C) 98.4 F (36.9 C)  TempSrc: Oral Oral Oral Oral  SpO2: 100% 100% 99% 100%  Weight:   65.3 kg     Intake/Output Summary (Last 24 hours) at 06/01/2020 0814 Last data filed at 06/01/2020 0315 Gross per 24 hour  Intake --  Output 700 ml  Net -700 ml   Filed Weights   05/27/20 0407 05/29/20 0411 06/01/20 0314  Weight: 63.4 kg 61.6 kg 65.3 kg     Data Reviewed:   CBC: Recent Labs  Lab 05/28/20 0242 05/28/20 0242 05/29/20 0409 05/29/20 2113 05/30/20 0217 05/31/20 0243 06/01/20 0519  WBC 16.8*  --  14.1*  --  13.9* 9.7 9.3  NEUTROABS 14.1*  --  11.3*  --  11.3* 6.9 6.5  HGB 6.6*   < > 7.1* 9.2* 8.8* 8.5* 8.9*  HCT 22.9*   < > 23.4* 29.6* 29.3* 27.2* 29.4*  MCV 103.6*  --  97.5  --  97.0 96.8 96.7  PLT 216  --  204  --  189 198 201   < > = values in this interval not displayed.   Basic Metabolic Panel: Recent Labs  Lab  05/28/20 0242 05/29/20 0409 05/29/20 0653 05/30/20 0217 05/31/20 0243 06/01/20 0519  NA 156*  --  153* 150* 143 140  K 4.2  --  3.9 3.9 3.7 4.1  CL 126*  --  123* 123* 117* 112*  CO2 21*  --  21* 23 20* 20*  GLUCOSE 123*  --  129* 143* 135* 110*  BUN 64*  --  43* 31* 23 17  CREATININE 1.56*  --  1.28* 1.04 0.89 0.74  CALCIUM 8.8*  --  8.7* 8.7* 8.5* 8.6*  MG 2.7* 2.3  --  2.2 2.2 1.9   GFR: Estimated Creatinine Clearance: 84.2 mL/min (by C-G formula based on SCr of 0.74 mg/dL). Liver Function Tests: Recent Labs  Lab 05/26/20 1025 05/27/20 0354 05/28/20 0242  AST 134* 117* 88*  ALT 140* 121* 105*  ALKPHOS 209* 181* 159*  BILITOT 0.7 0.7 0.7  PROT 8.1 7.2 7.4  ALBUMIN 2.3* 1.9* 1.9*   No results for input(s): LIPASE, AMYLASE in the last 168 hours. No results for input(s): AMMONIA in the last 168 hours. Coagulation Profile: No  results for input(s): INR, PROTIME in the last 168 hours. Cardiac Enzymes: No results for input(s): CKTOTAL, CKMB, CKMBINDEX, TROPONINI in the last 168 hours. BNP (last 3 results) No results for input(s): PROBNP in the last 8760 hours. HbA1C: No results for input(s): HGBA1C in the last 72 hours. CBG: Recent Labs  Lab 05/31/20 1541 05/31/20 1957 05/31/20 2322 06/01/20 0313 06/01/20 0744  GLUCAP 106* 124* 123* 122* 130*   Lipid Profile: No results for input(s): CHOL, HDL, LDLCALC, TRIG, CHOLHDL, LDLDIRECT in the last 72 hours. Thyroid Function Tests: No results for input(s): TSH, T4TOTAL, FREET4, T3FREE, THYROIDAB in the last 72 hours. Anemia Panel: No results for input(s): VITAMINB12, FOLATE, FERRITIN, TIBC, IRON, RETICCTPCT in the last 72 hours. Sepsis Labs: Recent Labs  Lab 05/27/20 0843  PROCALCITON 0.30    Recent Results (from the past 240 hour(s))  Culture, blood (routine x 2)     Status: None   Collection Time: 05/24/20 10:08 AM   Specimen: BLOOD  Result Value Ref Range Status   Specimen Description BLOOD RIGHT ANTECUBITAL   Final   Special Requests   Final    BOTTLES DRAWN AEROBIC ONLY Blood Culture results may not be optimal due to an inadequate volume of blood received in culture bottles   Culture   Final    NO GROWTH 5 DAYS Performed at [facility], [place] [address], [place], [place] [address]    Report Status 05/29/2020 FINAL  Final  Culture, blood (routine x 2)     Status: None   Collection Time: 05/24/20 10:14 AM   Specimen: BLOOD  Result Value Ref Range Status   Specimen Description BLOOD RIGHT ANTECUBITAL  Final   Special Requests   Final    BOTTLES DRAWN AEROBIC AND ANAEROBIC Blood Culture adequate volume   Culture   Final    NO GROWTH 5 DAYS Performed at [facility], [place] [address] [place], [place] [address]    Report Status 05/29/2020 FINAL  Final         Radiology Studies: DG Swallowing Func-Speech Pathology  Result Date: 05/30/2020 Objective Swallowing Evaluation: Type of Study: MBS-Modified Barium Swallow Study  Patient Details Name: Kyandre Okray MRN: [id number] Date of Birth: 18-Jul-1956 Today's Date: 05/30/2020 Time: SLP Start Time (ACUTE ONLY): 47 -SLP Stop Time (ACUTE ONLY): 1200 SLP Time Calculation (min) (ACUTE ONLY): 30 min Past Medical History: Past Medical History: Diagnosis Date . Alcohol use  . Tobacco abuse  Past Surgical History: Past Surgical History: Procedure Laterality Date . COLONOSCOPY N/A 03/01/2020  Procedure: COLONOSCOPY;  Surgeon: Toledo, Benay Pike, MD;  Location: [facility] ENDOSCOPY;  Service: Gastroenterology;  Laterality: N/A; . ESOPHAGOGASTRODUODENOSCOPY N/A 03/01/2020  Procedure: ESOPHAGOGASTRODUODENOSCOPY (EGD);  Surgeon: Toledo, Benay Pike, MD;  Location: [facility] ENDOSCOPY;  Service: Gastroenterology;  Laterality: N/A; . FOOT FRACTURE SURGERY Left  . IR GASTROSTOMY TUBE MOD SED  05/21/2020 HPI: Jemmie Rhinehart is a 64 y.o. male with a PMHx of alcohol use and tobacco abuse who presents acutely to the ED via EMS as a Code Stroke for acute onset of right sided  weakness. Patient with disoriented, right leg weakness, right limb ataxia, right decreased sensation and Global aphasia on exam. 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 Worsening of intraventricular extension.  MBS 10/19 mod-severe dysphagia, continue NPO. PEG 10/22.  Subjective: alert Assessment / Plan / Recommendation CHL IP CLINICAL IMPRESSIONS 05/30/2020 Clinical Impression Pt presented with improved oropharyngeal swallow.  He was more alert, talking (unintelligibly secondary to aphasia);  did not follow commands.  Continues to have left gaze preference; neck was in slight extension during today's study.  He demonstrated improved participation, and was able to orally manipulate barium. There was persisting disorganization with right residue and premature spillage, but function was improved. There was retention of POs in the vallecular and pyrform spaces, but improved laryngeal vestibule closure and only mild penetration of POs (thin/nectar/honey thick liquids) as they collected in the pyriforms and intermittently spilled over the arytenoids, triggering a cough when they reached the vocal folds.  Recommend continued NPO for now. SLP will begin meal trials with pt to determine clinical toleration. Anticipate Mr. Llanas will be able to transition to some POs this week.  SLP Visit Diagnosis Dysphagia, oropharyngeal phase (R13.12) Attention and concentration deficit following -- Frontal lobe and executive function deficit following -- Impact on safety and function Moderate aspiration risk   CHL IP TREATMENT RECOMMENDATION 05/30/2020 Treatment Recommendations Therapy as outlined in treatment plan below   Prognosis 05/30/2020 Prognosis for Safe Diet Advancement Good Barriers to Reach Goals Language deficits Barriers/Prognosis Comment -- CHL IP DIET RECOMMENDATION 05/30/2020 SLP Diet Recommendations NPO Liquid Administration via -- Medication Administration Via alternative means Compensations --  Postural Changes --   CHL IP OTHER RECOMMENDATIONS 05/30/2020 Recommended Consults -- Oral Care Recommendations Oral care QID Other Recommendations --   CHL IP FOLLOW UP RECOMMENDATIONS 05/30/2020 Follow up Recommendations Skilled Nursing facility   [facility] IP FREQUENCY AND DURATION 05/30/2020 Speech Therapy Frequency (ACUTE ONLY) min 2x/week Treatment Duration 2 weeks      CHL IP ORAL PHASE 05/30/2020 Oral Phase Impaired Oral - Pudding Teaspoon -- Oral - Pudding Cup -- Oral - Honey Teaspoon Right pocketing in lateral sulci;Lingual/palatal residue;Piecemeal swallowing;Decreased bolus cohesion;Premature spillage Oral - Honey Cup -- Oral - Nectar Teaspoon Right pocketing in lateral sulci;Lingual/palatal residue;Piecemeal swallowing;Decreased bolus cohesion;Premature spillage Oral - Nectar Cup -- Oral - Nectar Straw -- Oral - Thin Teaspoon Right pocketing in lateral sulci;Lingual/palatal residue;Piecemeal swallowing;Decreased bolus cohesion;Premature spillage Oral - Thin Cup Right pocketing in lateral sulci;Lingual/palatal residue;Piecemeal swallowing;Decreased bolus cohesion;Premature spillage Oral - Thin Straw -- Oral - Puree Right pocketing in lateral sulci;Lingual/palatal residue;Piecemeal swallowing;Decreased bolus cohesion Oral - Mech Soft -- Oral - Regular -- Oral - Multi-Consistency -- Oral - Pill -- Oral Phase - Comment --  CHL IP PHARYNGEAL PHASE 05/30/2020 Pharyngeal Phase Impaired Pharyngeal- Pudding Teaspoon -- Pharyngeal -- Pharyngeal- Pudding Cup -- Pharyngeal -- Pharyngeal- Honey Teaspoon Delayed swallow initiation-vallecula;Delayed swallow initiation-pyriform sinuses;Pharyngeal residue - valleculae;Pharyngeal residue - pyriform Pharyngeal -- Pharyngeal- Honey Cup -- Pharyngeal -- Pharyngeal- Nectar Teaspoon Delayed swallow initiation-vallecula;Delayed swallow initiation-pyriform sinuses;Pharyngeal residue - valleculae;Pharyngeal residue - pyriform;Penetration/Apiration after swallow Pharyngeal Material  enters airway, passes BELOW cords without attempt by patient to eject out (silent aspiration) Pharyngeal- Nectar Cup -- Pharyngeal -- Pharyngeal- Nectar Straw -- Pharyngeal -- Pharyngeal- Thin Teaspoon Delayed swallow initiation-pyriform sinuses;Penetration/Apiration after swallow;Pharyngeal residue - valleculae;Pharyngeal residue - pyriform Pharyngeal Material enters airway, passes BELOW cords and not ejected out despite cough attempt by patient Pharyngeal- Thin Cup NT Pharyngeal -- Pharyngeal- Thin Straw -- Pharyngeal -- Pharyngeal- Puree Delayed swallow initiation-vallecula;Reduced pharyngeal peristalsis;Pharyngeal residue - valleculae;Pharyngeal residue - pyriform Pharyngeal -- Pharyngeal- Mechanical Soft -- Pharyngeal -- Pharyngeal- Regular -- Pharyngeal -- Pharyngeal- Multi-consistency -- Pharyngeal -- Pharyngeal- Pill -- Pharyngeal -- Pharyngeal Comment --  No flowsheet data found. Juan Quam Laurice 05/30/2020, 1:11 PM                   Scheduled Meds: . chlorhexidine  15 mL Mouth Rinse BID  . feeding supplement (OSMOLITE 1.2 CAL)  1,000 mL Per Tube Q24H  . free water  200 mL Per Tube Q4H  . insulin aspart  0-9 Units Subcutaneous Q4H  . insulin glargine  15 Units Subcutaneous Daily  . mouth rinse  15 mL Mouth Rinse q12n4p  . pantoprazole sodium  40 mg Per Tube BID  . polyethylene glycol powder  0.5 Container Per Tube Once  . senna-docusate  1 tablet Per Tube BID  . sodium chloride flush  10-40 mL Intracatheter Q12H   Continuous Infusions: . cefTRIAXone (ROCEPHIN)  IV 1 g (05/31/20 1330)     LOS: 27 days   Time spent= 35 mins    Otha Monical Arsenio Loader, MD [facility]  If 7PM-7AM, please contact night-coverage  06/01/2020, 8:14 AM

## 2020-06-01 NOTE — Progress Notes (Signed)
  Speech Language Pathology Treatment: Dysphagia;Cognitive-Linquistic  Patient Details Name: Alan Everett MRN: [id number] DOB: Jan 09, 1956 Today's Date: 06/01/2020 Time: 4010-2725 SLP Time Calculation (min) (ACUTE ONLY): 25 min  Assessment / Plan / Recommendation Clinical Impression  Alan Everett was smiling, participatory, and talkative today. Although speech was largely unintelligible, with max cues (modeling, unison voicing, repetition, fading) pt was able to approximate counting from 1-10 and produced the word "you" at the end the stanza "happy bday to...."  When speech was clear, he demonstrated recognition and would smile.  After repositioning and oral care, pt was provided with trial of nectar thick liquids and pudding.  With container stabilized, he was able to load spoon and bring pudding to mouth with LUE.  There was right spillage, which pt attended to 50% of the time; required cues otherwise.  He consumed 4 oz pudding and 4 oz nectar liquid with no overt s/s of aspiration.  Based on results of recent MBS, which revealed intermittent penetration of POs and reliable spontaneous throat-clearing in response, recommend starting a dysphagia 1 diet with nectar-thick liquids.   May need to adjust PEG feedings to accommodate addition of PO intake.    Pt stated "Delicious!" with great clarity after finishing his pudding!  SLP will follow.   HPI HPI: Alan Everett is a 64 y.o. male with a PMHx of alcohol use and tobacco abuse who presents acutely to the ED via EMS as a Code Stroke for acute onset of right sided weakness. Patient with disoriented, right leg weakness, right limb ataxia, right decreased sensation and Global aphasia on exam. 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 Worsening of intraventricular extension.  MBS 10/19 mod-severe dysphagia, continue NPO. PEG 10/22.  Repeat MBS 10/30 with improvements. Started dys1/nectar 11/2.       SLP Plan  Continue with current plan  of care       Recommendations  Diet recommendations: Dysphagia 1 (puree);Nectar-thick liquid Liquids provided via: Cup Medication Administration: Via alternative means Supervision: Staff to assist with self feeding;Full supervision/cueing for compensatory strategies Compensations: Minimize environmental distractions Postural Changes and/or Swallow Maneuvers: Seated upright 90 degrees                Oral Care Recommendations: Oral care BID Follow up Recommendations: Skilled Nursing facility SLP Visit Diagnosis: Dysphagia, oropharyngeal phase (R13.12) Plan: Continue with current plan of care       GO                Alan Everett 06/01/2020, 9:45 AM  Alan Everett L. Alan Frederic, MA CCC/SLP Acute Rehabilitation Services Office number [phone] Pager [phone]

## 2020-06-01 NOTE — Progress Notes (Signed)
Physical Therapy Treatment Patient Details Name: Alan Everett MRN: [id number] DOB: January 31, 1956 Today's Date: 06/01/2020    History of Present Illness Patient is a 64 y/o male with PMH ETOH use, tobacco abuse, admitted with R side weakness,sensory loss and speech deficits.  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    PT Comments    Pt sleeping upon PT/OT arrival but aroused once sitting EOB. Pt with occasional smiling and participation in therapy today. Worked in sitting most of the time to facilitate midline head posture and gaze. Also worked on modifying hip position to get elongation of trunk in sitting and intentional use of LUE. Pt showed interest in music when discussed so left gospel music playing for him on the computer in his room. PT will continue to follow.    Follow Up Recommendations  SNF;Supervision/Assistance - 24 hour     Equipment Recommendations  Wheelchair cushion (measurements PT);Wheelchair (measurements PT);Hospital bed;Other (comment)    Recommendations for Other Services       Precautions / Restrictions Precautions Precautions: Fall Precaution Comments: L handmitt, G tube Restrictions Weight Bearing Restrictions: No    Mobility  Bed Mobility Overal bed mobility: Needs Assistance Bed Mobility: Supine to Sit;Sit to Supine     Supine to sit: Total assist;+2 for physical assistance Sit to supine: Total assist;+2 for physical assistance   General bed mobility comments: tot A +2 for sup to sit due to pt lethargy. Pt alert with return to supine but fatigued at that point  Transfers Overall transfer level: Needs assistance   Transfers: Sit to/from Stand Sit to Stand: Max assist         General transfer comment: attempted sit to partial stand to place air cushion under pt buttocks but he was unable to clear hips enough to fully stand even with max A  Ambulation/Gait              General Gait Details: unable   Stairs             Wheelchair Mobility    Modified Rankin (Stroke Patients Only) Modified Rankin (Stroke Patients Only) Pre-Morbid Rankin Score: No symptoms Modified Rankin: Severe disability     Balance Overall balance assessment: Needs assistance Sitting-balance support: No upper extremity supported;Feet supported;Single extremity supported Sitting balance-Leahy Scale: Poor Sitting balance - Comments: sat EOB >10 mins. Placed air cushion under pt's R hip which helped him activate R obliques more and sit with more elongated trunk. Cushion under L hip was not effective. OT worked on [employer] to [employer]  in sitting and then R shoulder passive flexion performed. AAROM L shoulder flexion as well as reaching activities performed in sitting.  Postural control: Posterior lean Standing balance support: Bilateral upper extremity supported Standing balance-Leahy Scale: Zero                 High Level Balance Comments: worked on bringing pt's head posture and gaze to midline in supine and sitting            Cognition Arousal/Alertness: Lethargic Behavior During Therapy: Flat affect Overall Cognitive Status: Difficult to assess                                 General Comments: pt occasionally smiling and following commands      Exercises      General Comments  Pertinent Vitals/Pain Pain Assessment: Faces Faces Pain Scale: No hurt    Home Living                      Prior Function            PT Goals (current goals can now be found in the care plan section) Acute Rehab PT Goals Patient Stated Goal: unable to state PT Goal Formulation: Patient unable to participate in goal setting Time For Goal Achievement: 06/03/20 Potential to Achieve Goals: Fair Progress towards PT goals: Progressing toward goals    Frequency    Min 3X/week      PT Plan Current plan remains appropriate     Co-evaluation PT/OT/SLP Co-Evaluation/Treatment: Yes Reason for Co-Treatment: Complexity of the patient's impairments (multi-system involvement);Necessary to address cognition/behavior during functional activity;For patient/therapist safety PT goals addressed during session: Mobility/safety with mobility;Balance;Strengthening/ROM        AM-PAC PT "6 Clicks" Mobility   Outcome Measure  Help needed turning from your back to your side while in a flat bed without using bedrails?: Total Help needed moving from lying on your back to sitting on the side of a flat bed without using bedrails?: Total Help needed moving to and from a bed to a chair (including a wheelchair)?: Total Help needed standing up from a chair using your arms (e.g., wheelchair or bedside chair)?: Total Help needed to walk in hospital room?: Total Help needed climbing 3-5 steps with a railing? 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

## 2020-06-01 NOTE — Progress Notes (Signed)
Occupational Therapy Treatment Patient Details Name: Alan Everett MRN: [id number] DOB: Jan 23, 1956 Today's Date: 06/01/2020    History of present illness Patient is a 64 y/o male with PMH ETOH use, tobacco abuse, admitted with R side weakness,sensory loss and speech deficits.  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   OT comments  Patient continues to make steady progress towards goals in skilled OT session. Patient's session encompassed co-treat with PT in order to progress with neuromuscular re-education. Pt remains a total A of 2 for bed mobility, but with positioning is able to maintain seated balance at EOB with min A of 1. Tone remains unpurposeful in LUE but with tactile stimulation at L bicep and mod A is able to curl and flex x3 prior to fatigue. Pt is currently unable to reach out and grab therapists hand in any plane on L. Increased time was spent on RUE due to significant protraction of scapula (minimal ability to get scapula into retraction, scapula is not fixed yet, but is getting increasingly difficulty to manually manipulate) and promotion of neck coming into midline. Pt requires mod-max A in order to come to midline with head, but is unable to visually track at midline or to the R. Discharge remains appropriate, will continue to follow acutely.    Follow Up Recommendations  SNF    Equipment Recommendations  Wheelchair (measurements OT);Wheelchair cushion (measurements OT);Hospital bed    Recommendations for Other Services      Precautions / Restrictions Precautions Precautions: Fall Precaution Comments: L  and R handmitt, G tube Restrictions Weight Bearing Restrictions: No       Mobility Bed Mobility Overal bed mobility: Needs Assistance Bed Mobility: Supine to Sit;Sit to Supine     Supine to sit: Total assist;+2 for physical assistance Sit to supine: Total assist;+2 for physical assistance   General  bed mobility comments: tot A +2 for sup to sit due to pt lethargy. Pt alert with return to supine but fatigued at that point  Transfers Overall transfer level: Needs assistance   Transfers: Sit to/from Stand Sit to Stand: Max assist         General transfer comment: attempted sit to partial stand to place air cushion under pt buttocks but he was unable to clear hips enough to fully stand even with max A    Balance Overall balance assessment: Needs assistance Sitting-balance support: No upper extremity supported;Feet supported;Single extremity supported Sitting balance-Leahy Scale: Poor Sitting balance - Comments: sat EOB >10 mins. Placed air cushion under pt's R hip which helped him activate R obliques more and sit with more elongated trunk. Cushion under L hip was not effective. OT worked on [employer] to [employer]  in sitting and then R shoulder passive flexion performed. AAROM L shoulder flexion as well as reaching activities performed in sitting.  Postural control: Posterior lean Standing balance support: Bilateral upper extremity supported Standing balance-Leahy Scale: Zero                 High Level Balance Comments: worked on bringing pt's head posture and gaze to midline in supine and sitting           ADL either performed or assessed with clinical judgement   ADL Overall ADL's : Needs assistance/impaired     Grooming: Total assistance Grooming Details (indicate cue type and reason): pt initiating progressing LUE to face, required assistance for support at elbow and forearm, required extended time and  effort to move UE                               General ADL Comments: pt is total (A) for all adls.     Vision       Perception     Praxis      Cognition Arousal/Alertness: Lethargic Behavior During Therapy: Flat affect Overall Cognitive Status: Difficult to assess                                 General Comments: pt occasionally  smiling and following commands        Exercises Other Exercises Other Exercises: worked on passive cervical rotation and lateral flexion to R Other Exercises: massage and PROM completed to R scapula (pulling into retraction) due to nearly fixed protracted state   Shoulder Instructions       General Comments      Pertinent Vitals/ Pain       Pain Assessment: Faces Faces Pain Scale: No hurt  Home Living                                          Prior Functioning/Environment              Frequency  Min 2X/week        Progress Toward Goals  OT Goals(current goals can now be found in the care plan section)  Progress towards OT goals: Progressing toward goals  Acute Rehab OT Goals Patient Stated Goal: unable to state OT Goal Formulation: Patient unable to participate in goal setting Time For Goal Achievement: 06/09/20 Potential to Achieve Goals: Fair  Plan Discharge plan remains appropriate    Co-evaluation      Reason for Co-Treatment: Complexity of the patient's impairments (multi-system involvement);Necessary to address cognition/behavior during functional activity;For patient/therapist safety PT goals addressed during session: Mobility/safety with mobility;Balance;Strengthening/ROM OT goals addressed during session: ADL's and self-care;Strengthening/ROM      AM-PAC OT "6 Clicks" Daily Activity     Outcome Measure   Help from another person eating meals?: Total Help from another person taking care of personal grooming?: Total Help from another person toileting, which includes using toliet, bedpan, or urinal?: Total Help from another person bathing (including washing, rinsing, drying)?: Total Help from another person to put on and taking off regular upper body clothing?: Total Help from another person to put on and taking off regular lower body clothing?: Total 6 Click Score: 6    End of Session    OT Visit Diagnosis: Other  abnormalities of gait and mobility (R26.89);Muscle weakness (generalized) (M62.81);Low vision, both eyes (H54.2);Other symptoms and signs involving cognitive function;Hemiplegia and hemiparesis Hemiplegia - Right/Left: Right Hemiplegia - dominant/non-dominant: Dominant   Activity Tolerance Patient tolerated treatment well   Patient Left in bed;with call bell/phone within reach;with bed alarm set;with restraints reapplied;with SCD's reapplied   Nurse Communication Mobility status;Precautions        Time: 1194-1740 OT Time Calculation (min): 33 min  Charges: OT General Charges $OT Visit: 1 Visit OT Treatments $Neuromuscular Re-education: 8-22 mins  Pollyann Glen E. Pria Klosinski, COTA/L Acute Rehabilitation Services [phone]   Cherlyn Cushing 06/01/2020, 3:10 PM

## 2020-06-01 NOTE — Plan of Care (Signed)
Pt alert and has been trying to vocalize things. TF is patent. BM last night.  Problem: Education: Goal: Knowledge of General Education information will improve Description: Including pain rating scale, medication(s)/side effects and non-pharmacologic comfort measures Outcome: Progressing   Problem: Health Behavior/Discharge Planning: Goal: Ability to manage health-related needs will improve Outcome: Progressing   Problem: Clinical Measurements: Goal: Ability to maintain clinical measurements within normal limits will improve Outcome: Progressing Goal: Will remain free from infection Outcome: Progressing Goal: Diagnostic test results will improve Outcome: Progressing Goal: Respiratory complications will improve Outcome: Progressing Goal: Cardiovascular complication will be avoided Outcome: Progressing   Problem: Activity: Goal: Risk for activity intolerance will decrease Outcome: Progressing   Problem: Nutrition: Goal: Adequate nutrition will be maintained Outcome: Progressing   Problem: Coping: Goal: Level of anxiety will decrease Outcome: Progressing   Problem: Elimination: Goal: Will not experience complications related to bowel motility Outcome: Progressing Goal: Will not experience complications related to urinary retention Outcome: Progressing   Problem: Pain Managment: Goal: General experience of comfort will improve Outcome: Progressing   Problem: Safety: Goal: Ability to remain free from injury will improve Outcome: Progressing   Problem: Skin Integrity: Goal: Risk for impaired skin integrity will decrease Outcome: Progressing   Problem: Education: Goal: Knowledge of disease or condition will improve Outcome: Progressing Goal: Knowledge of secondary prevention will improve Outcome: Progressing Goal: Knowledge of patient specific risk factors addressed and post discharge goals established will improve Outcome: Progressing Goal: Individualized  Educational Video(s) Outcome: Progressing   Problem: Coping: Goal: Will verbalize positive feelings about self Outcome: Progressing Goal: Will identify appropriate support needs Outcome: Progressing   Problem: Health Behavior/Discharge Planning: Goal: Ability to manage health-related needs will improve Outcome: Progressing   Problem: Self-Care: Goal: Ability to participate in self-care as condition permits will improve Outcome: Progressing Goal: Verbalization of feelings and concerns over difficulty with self-care will improve Outcome: Progressing Goal: Ability to communicate needs accurately will improve Outcome: Progressing   Problem: Nutrition: Goal: Risk of aspiration will decrease Outcome: Progressing Goal: Dietary intake will improve Outcome: Progressing   Problem: Intracerebral Hemorrhage Tissue Perfusion: Goal: Complications of Intracerebral Hemorrhage will be minimized Outcome: Progressing

## 2020-06-02 DIAGNOSIS — L8990 Pressure ulcer of unspecified site, unspecified stage: Secondary | ICD-10-CM | POA: Insufficient documentation

## 2020-06-02 LAB — CBC WITH DIFFERENTIAL/PLATELET
Abs Immature Granulocytes: 0.31 10*3/uL — ABNORMAL HIGH (ref 0.00–0.07)
Basophils Absolute: 0 10*3/uL (ref 0.0–0.1)
Basophils Relative: 0 %
Eosinophils Absolute: 0.3 10*3/uL (ref 0.0–0.5)
Eosinophils Relative: 3 %
HCT: 27.5 % — ABNORMAL LOW (ref 39.0–52.0)
Hemoglobin: 8.4 g/dL — ABNORMAL LOW (ref 13.0–17.0)
Immature Granulocytes: 3 %
Lymphocytes Relative: 11 %
Lymphs Abs: 1.2 10*3/uL (ref 0.7–4.0)
MCH: 30.5 pg (ref 26.0–34.0)
MCHC: 30.5 g/dL (ref 30.0–36.0)
MCV: 100 fL (ref 80.0–100.0)
Monocytes Absolute: 0.9 10*3/uL (ref 0.1–1.0)
Monocytes Relative: 8 %
Neutro Abs: 8.2 10*3/uL — ABNORMAL HIGH (ref 1.7–7.7)
Neutrophils Relative %: 75 %
Platelets: 198 10*3/uL (ref 150–400)
RBC: 2.75 MIL/uL — ABNORMAL LOW (ref 4.22–5.81)
RDW: 16.7 % — ABNORMAL HIGH (ref 11.5–15.5)
WBC: 10.9 10*3/uL — ABNORMAL HIGH (ref 4.0–10.5)
nRBC: 0.5 % — ABNORMAL HIGH (ref 0.0–0.2)

## 2020-06-02 LAB — BASIC METABOLIC PANEL
Anion gap: 9 (ref 5–15)
BUN: 19 mg/dL (ref 8–23)
CO2: 20 mmol/L — ABNORMAL LOW (ref 22–32)
Calcium: 8.5 mg/dL — ABNORMAL LOW (ref 8.9–10.3)
Chloride: 109 mmol/L (ref 98–111)
Creatinine, Ser: 0.8 mg/dL (ref 0.61–1.24)
GFR, Estimated: >60 mL/min (ref >60)
Glucose, Bld: 138 mg/dL — ABNORMAL HIGH (ref 70–99)
Potassium: 4 mmol/L (ref 3.5–5.1)
Sodium: 138 mmol/L (ref 135–145)

## 2020-06-02 LAB — GLUCOSE, CAPILLARY
Glucose-Capillary: 139 mg/dL — ABNORMAL HIGH (ref 70–99)
Glucose-Capillary: 130 mg/dL — ABNORMAL HIGH (ref 70–99)
Glucose-Capillary: 156 mg/dL — ABNORMAL HIGH (ref 70–99)
Glucose-Capillary: 109 mg/dL — ABNORMAL HIGH (ref 70–99)
Glucose-Capillary: 118 mg/dL — ABNORMAL HIGH (ref 70–99)

## 2020-06-02 LAB — MAGNESIUM: Magnesium: 1.9 mg/dL (ref 1.7–2.4)

## 2020-06-02 MED ORDER — CARVEDILOL 3.125 MG PO TABS
3.1250 mg | ORAL_TABLET | Freq: Two times a day (BID) | ORAL | Status: DC
Start: 2020-06-02 — End: 2020-06-02

## 2020-06-02 MED ORDER — SENNOSIDES 8.8 MG/5ML PO SYRP
5.0000 mL | ORAL_SOLUTION | Freq: Two times a day (BID) | ORAL | Status: DC
  Administered 2020-06-02 – 2020-07-16 (×75): 5 mL
  Filled 2020-06-02 (×92): qty 5

## 2020-06-02 MED ORDER — CARVEDILOL 3.125 MG PO TABS
3.1250 mg | ORAL_TABLET | Freq: Two times a day (BID) | ORAL | Status: DC
  Administered 2020-06-02 – 2020-06-04 (×5): 3.125 mg
  Filled 2020-06-02 (×5): qty 1

## 2020-06-02 NOTE — Progress Notes (Signed)
  Speech Language Pathology Treatment: Dysphagia;Cognitive-Linquistic  Patient Details Name: Alan Everett MRN: [id number] DOB: Feb 15, 1956 Today's Date: 06/02/2020 Time: 8101-7510 SLP Time Calculation (min) (ACUTE ONLY): 20 min  Assessment / Plan / Recommendation Clinical Impression  Pt was seen for dysphagia and cognitive-linguistic treatment. He was very alert, but highly distractible and did not follow directions. He was seen with his lunch tray, but only accepted 1 PO of puree and 1 sip of nectar-thick liquid. He demonstrated anterior spillage of liquids and oral holding of solids, but no s/sx of aspiration were observed. SLP provided max multimodal cueing in order to keep patient engaged in meal, but cueing was unsuccesful. SLP also attempted to facilitate automatic speech tasks with pt, but he was too distractable to partcipate, even with max cueing and direct modeling. His spontaneous speech was still largely unintelligible, but a few words in his jargon were appropriate and accurate. SLP will continue to f/u acutely.    HPI HPI: Alan Everett is a 64 y.o. male with a PMHx of alcohol use and tobacco abuse who presents acutely to the ED via EMS as a Code Stroke for acute onset of right sided weakness. Patient with disoriented, right leg weakness, right limb ataxia, right decreased sensation and Global aphasia on exam. 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 Worsening of intraventricular extension.  MBS 10/19 mod-severe dysphagia, continue NPO. PEG 10/22.  Repeat MBS 10/30 with improvements. Started dys1/nectar 11/2.       SLP Plan  Continue with current plan of care       Recommendations  Diet recommendations: Dysphagia 1 (puree);Nectar-thick liquid Liquids provided via: Cup Medication Administration: Via alternative means Supervision: Staff to assist with self feeding;Full supervision/cueing for compensatory strategies Compensations: Minimize environmental  distractions;Monitor for anterior loss Postural Changes and/or Swallow Maneuvers: Seated upright 90 degrees                Oral Care Recommendations: Oral care BID Follow up Recommendations: Skilled Nursing facility SLP Visit Diagnosis: Dysphagia, oropharyngeal phase (R13.12);Aphasia (R47.01) Plan: Continue with current plan of care       GO                Alan Everett 06/02/2020, 1:03 PM

## 2020-06-02 NOTE — Progress Notes (Signed)
PROGRESS NOTE  Alan SierrasJiles Everett  DOB: 29-Nov-1955  PCP: Patient, No Pcp Per UJW:[id number]  DOA: 05/05/2020  LOS: 28 days   Chief Complaint  Patient presents with  . Code Stroke   Brief narrative: 64 y.o.malewith a PMHx of alcohol use and tobacco abuse. Patient presented to the ED via EMS on 10/6 as a Code Stroke for acute onset of right sided weakness. He also had sensory loss and weakness involving his RUE and RLE.  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  CT head revealed an enlarging L thalamocapsular IPH w/ increasing IVH from L ventricle into 3rd and 4th ventricles. Neurosurgery was consulted for EVD consideration which was then deferred.  Likely the stroke was in the setting of alcohol and cocaine use.  UDS positive for cocaine.    On 05/08/2020, patient spiked fever and he was started on broad-spectrum antibiotics/IV Zosyn empirically. Despite of antibiotics, patient's white blood cells as well as fever continue to get worsen. CT abdomen chest and pelvis was obtained which showed bibasilar infiltrates suspecting possible pneumonia but no other pathology.  2D EchoEF 60-65%.   Hospital course complicated by recurrent blood loss anemia.  Therefore GI reconsulted-but still recommending conservative management for now.  Subjective: Patient was seen and examined this morning. Middle-aged African-American male.  Looks older for his age.  Lying on bed.  Alert, awake.  Able to verbalize words but not able to answer appropriately.  Unable to follow motor commands.  Assessment/Plan: Acute Hemorrhagic stroke/cerebral edema -Likely in the setting of severe hypertension, alcohol and cocaine use. -Previously was discussed with neurosurgery and neurology. -Subsequent CT scans of the head showed a stable findings.   -He is alert and awake but has global aphasia, unable to follow any command. -No change in neurological status in at least last  3 weeks.  Moderate to severe dysphagia -PEG tube placed on 05/21/2020. -Monitor for aspiration-currently does not have any cough  Acute on chronic anemia -Total of 6 units of PRBC transfusions given in this admission. -Hemoglobin 8.4 on last check this morning.  Continue to monitor. -IV iron given on 10/31 -GI consult appreciated.  Currently endoscopic evaluation is on hold. -Continue PPI twice daily. -Ferritin level was low at 3 in August 2021 and better at 254 this admission. Recent Labs    02/29/20 0105 02/29/20 0111 02/29/20 0455 05/11/20 [id number] 05/12/20 0510 05/27/20 [id number] 05/28/20 0242 05/29/20 0409 05/29/20 2113 05/30/20 0217 05/30/20 0217 05/31/20 0243 05/31/20 0243 06/01/20 0519 06/02/20 0246  HGB 5.9*  --    < > 8.7*   < >  --    < >  --  9.2* 8.8*  --  8.5*  --  8.9* 8.4*  MCV 72.8*  --    < > 93.7   < >  --    < >   < >  --  97.0   < > 96.8   < > 96.7 100.0  VITAMINB12  --  435  --  377  --  690  --   --   --   --   --   --   --   --   --   FOLATE 12.0  --   --  8.0  --   --   --   --   --   --   --   --   --   --   --   FERRITIN 3*  --   --  254  --   --   --   --   --   --   --   --   --   --   --   TIBC 476*  --   --  276  --   --   --   --   --   --   --   --   --   --   --   IRON 43*  --   --  39*  --   --   --   --   --   --   --   --   --   --   --   RETICCTPCT 1.9  --   --  2.0  --   --   --   --   --   --   --   --   --   --   --    < > = values in this interval not displayed.   Klebsiella UTI  -Urine culture report from 10/10 with more than 1000 CFU per mL of Klebsiella pneumoniae.  Complete 5-day course of IV Rocephin.   Sinus tachycardia Essential hypertension -Currently not on any blood pressure medication. -Start on Coreg 3.125 mg daily.  Hyperlipidemia -LDL less than 70  Hypernatremia -Improving trend.  Currently getting free water with tube feeding.  Continue the same. Recent Labs  Lab 05/27/20 0354 05/28/20 0242 05/29/20 0653  05/30/20 0217 05/31/20 0243 06/01/20 0519 06/02/20 0246  NA 156* 156* 153* 150* 143 140 138   Elevated liver enzymes -Right upper quadrant ultrasound-cholelithiasis without acute cholecystitis Recent Labs  Lab 05/27/20 0354 05/28/20 0242  AST 117* 88*  ALT 121* 105*  ALKPHOS 181* 159*  BILITOT 0.7 0.7  PROT 7.2 7.4  ALBUMIN 1.9* 1.9*   Tobacco Abuse -Counseled to quit using this  Cocaine abuse -Counseled to quit using this  GERD -PPI twice daily  Goals of care -Poor neurological recovery so far.  Poor expectation of further neurological recovery -Palliative care was consulted in the past.  Patient remains full code.  Will discuss with family.  Mobility: PT eval obtained Code Status:   Code Status: Full Code  Nutritional status: Body mass index is 23.2 kg/m. Nutrition Problem: Severe Malnutrition Etiology: social / environmental circumstances Signs/Symptoms: severe fat depletion, severe muscle depletion Diet Order            DIET - DYS 1 Room service appropriate? No; Fluid consistency: Nectar Thick  Diet effective now                 DVT prophylaxis: SCD's Start: 05/05/20 1938   Antimicrobials:  None Fluid: None Consultants: Neurology Family Communication:  Not at bedside  Status is: Inpatient  Remains inpatient appropriate because:Unsafe d/c plan   Dispo: The patient is from: Home              Anticipated d/c is to: SNF              Anticipated d/c date is: Whenever bed is available              Patient currently is medically stable to d/c.       Infusions:    Scheduled Meds: . carvedilol  3.125 mg Per Tube BID WC  . chlorhexidine  15 mL Mouth Rinse BID  . feeding supplement (OSMOLITE 1.2 CAL)  1,000 mL Per Tube Q24H  . free water  200  mL Per Tube Q4H  . insulin aspart  0-9 Units Subcutaneous Q4H  . insulin glargine  15 Units Subcutaneous Daily  . mouth rinse  15 mL Mouth Rinse q12n4p  . pantoprazole sodium  40 mg Per Tube BID  .  polyethylene glycol powder  0.5 Container Per Tube Once  . sennosides  5 mL Per Tube BID  . sodium chloride flush  10-40 mL Intracatheter Q12H    Antimicrobials: Anti-infectives (From admission, onward)   Start     Dose/Rate Route Frequency Ordered Stop   05/28/20 0900  cefTRIAXone (ROCEPHIN) 1 g in sodium chloride 0.9 % 100 mL IVPB        1 g 200 mL/hr over 30 Minutes Intravenous Every 24 hours 05/28/20 0807 06/01/20 0855   05/21/20 1515  ceFAZolin (ANCEF) IVPB 2g/100 mL premix        2 g 200 mL/hr over 30 Minutes Intravenous To Radiology 05/21/20 1429 05/21/20 2100   05/14/20 1000  cefTRIAXone (ROCEPHIN) 2 g in sodium chloride 0.9 % 100 mL IVPB        2 g 200 mL/hr over 30 Minutes Intravenous Daily 05/13/20 1454 05/15/20 1255   05/13/20 2200  metroNIDAZOLE (FLAGYL) tablet 500 mg        500 mg Per Tube Every 8 hours 05/13/20 1847 05/15/20 2219   05/13/20 1400  metroNIDAZOLE (FLAGYL) tablet 500 mg  Status:  Discontinued        500 mg Oral Every 8 hours 05/13/20 0935 05/13/20 1847   05/12/20 0900  cefTRIAXone (ROCEPHIN) 1 g in sodium chloride 0.9 % 100 mL IVPB  Status:  Discontinued        1 g 200 mL/hr over 30 Minutes Intravenous Daily 05/12/20 0817 05/13/20 1454   05/12/20 0900  azithromycin (ZITHROMAX) 500 mg in sodium chloride 0.9 % 250 mL IVPB  Status:  Discontinued        500 mg 250 mL/hr over 60 Minutes Intravenous Daily 05/12/20 0817 05/13/20 0935   05/09/20 0930  Ampicillin-Sulbactam (UNASYN) 3 g in sodium chloride 0.9 % 100 mL IVPB  Status:  Discontinued        3 g 200 mL/hr over 30 Minutes Intravenous Every 8 hours 05/09/20 0837 05/09/20 0839   05/09/20 0930  Ampicillin-Sulbactam (UNASYN) 3 g in sodium chloride 0.9 % 100 mL IVPB  Status:  Discontinued        3 g 200 mL/hr over 30 Minutes Intravenous Every 6 hours 05/09/20 0839 05/12/20 0817      PRN meds: acetaminophen **OR** acetaminophen (TYLENOL) oral liquid 160 mg/5 mL **OR** acetaminophen, hydrALAZINE,  levalbuterol, sodium chloride flush   Objective: Vitals:   06/02/20 1228 06/02/20 1236  BP: 135/76   Pulse: (!) 111 96  Resp: 18   Temp: 98 F (36.7 C)   SpO2: 100%     Intake/Output Summary (Last 24 hours) at 06/02/2020 1412 Last data filed at 06/02/2020 0900 Gross per 24 hour  Intake 1900 ml  Output 400 ml  Net 1500 ml   Filed Weights   05/29/20 0411 06/01/20 0314 06/02/20 0339  Weight: 61.6 kg 65.3 kg 65.2 kg   Weight change: -0.1 kg Body mass index is 23.2 kg/m.   Physical Exam: General exam: Appears calm and comfortable.  Not in physical distress Skin: No rashes, lesions or ulcers. HEENT: Atraumatic, normocephalic, supple neck, no obvious bleeding Lungs: Clear to auscultation bilaterally CVS: Regular rate and rhythm, no murmur GI/Abd soft, continue, nondistended, bowel sound present CNS:  Alert, awake, able to produce some words but unable to give appropriate response Psychiatry: Mood appropriate Extremities: No pedal edema, no calf tenderness  Data Review: I have personally reviewed the laboratory data and studies available.  Recent Labs  Lab 05/29/20 0409 05/29/20 0409 05/29/20 2113 05/30/20 0217 05/31/20 0243 06/01/20 0519 06/02/20 0246  WBC 14.1*  --   --  13.9* 9.7 9.3 10.9*  NEUTROABS 11.3*  --   --  11.3* 6.9 6.5 8.2*  HGB 7.1*   < > 9.2* 8.8* 8.5* 8.9* 8.4*  HCT 23.4*   < > 29.6* 29.3* 27.2* 29.4* 27.5*  MCV 97.5  --   --  97.0 96.8 96.7 100.0  PLT 204  --   --  189 198 201 198   < > = values in this interval not displayed.   Recent Labs  Lab 05/28/20 0242 05/29/20 0409 05/29/20 0653 05/30/20 0217 05/31/20 0243 06/01/20 0519 06/02/20 0246  NA   < >  --  153* 150* 143 140 138  K   < >  --  3.9 3.9 3.7 4.1 4.0  CL   < >  --  123* 123* 117* 112* 109  CO2   < >  --  21* 23 20* 20* 20*  GLUCOSE   < >  --  129* 143* 135* 110* 138*  BUN   < >  --  43* 31* 23 17 19   CREATININE   < >  --  1.28* 1.04 0.89 0.74 0.80  CALCIUM   < >  --  8.7*  8.7* 8.5* 8.6* 8.5*  MG  --  2.3  --  2.2 2.2 1.9 1.9   < > = values in this interval not displayed.    F/u labs ordered  Signed, , MD [facility] 06/02/2020

## 2020-06-03 LAB — CBC WITH DIFFERENTIAL/PLATELET
Abs Immature Granulocytes: 0.63 10*3/uL — ABNORMAL HIGH (ref 0.00–0.07)
Basophils Absolute: 0 10*3/uL (ref 0.0–0.1)
Basophils Relative: 0 %
Eosinophils Absolute: 0.4 10*3/uL (ref 0.0–0.5)
Eosinophils Relative: 3 %
HCT: 26.8 % — ABNORMAL LOW (ref 39.0–52.0)
Hemoglobin: 8 g/dL — ABNORMAL LOW (ref 13.0–17.0)
Immature Granulocytes: 6 %
Lymphocytes Relative: 13 %
Lymphs Abs: 1.4 10*3/uL (ref 0.7–4.0)
MCH: 29.5 pg (ref 26.0–34.0)
MCHC: 29.9 g/dL — ABNORMAL LOW (ref 30.0–36.0)
MCV: 98.9 fL (ref 80.0–100.0)
Monocytes Absolute: 1.1 10*3/uL — ABNORMAL HIGH (ref 0.1–1.0)
Monocytes Relative: 10 %
Neutro Abs: 7.3 10*3/uL (ref 1.7–7.7)
Neutrophils Relative %: 68 %
Platelets: 198 10*3/uL (ref 150–400)
RBC: 2.71 MIL/uL — ABNORMAL LOW (ref 4.22–5.81)
RDW: 17.1 % — ABNORMAL HIGH (ref 11.5–15.5)
WBC: 10.8 10*3/uL — ABNORMAL HIGH (ref 4.0–10.5)
nRBC: 0.5 % — ABNORMAL HIGH (ref 0.0–0.2)

## 2020-06-03 LAB — PHOSPHORUS: Phosphorus: 3.3 mg/dL (ref 2.5–4.6)

## 2020-06-03 LAB — GLUCOSE, CAPILLARY
Glucose-Capillary: 102 mg/dL — ABNORMAL HIGH (ref 70–99)
Glucose-Capillary: 115 mg/dL — ABNORMAL HIGH (ref 70–99)
Glucose-Capillary: 124 mg/dL — ABNORMAL HIGH (ref 70–99)
Glucose-Capillary: 130 mg/dL — ABNORMAL HIGH (ref 70–99)
Glucose-Capillary: 137 mg/dL — ABNORMAL HIGH (ref 70–99)
Glucose-Capillary: 146 mg/dL — ABNORMAL HIGH (ref 70–99)

## 2020-06-03 LAB — MAGNESIUM: Magnesium: 1.9 mg/dL (ref 1.7–2.4)

## 2020-06-03 MED ORDER — OSMOLITE 1.5 CAL PO LIQD
780.0000 mL | ORAL | Status: DC
  Administered 2020-06-03 – 2020-06-16 (×14): 780 mL
  Filled 2020-06-03: qty 1000
  Filled 2020-06-03 (×2): qty 948
  Filled 2020-06-03 (×4): qty 1000
  Filled 2020-06-03: qty 948
  Filled 2020-06-03 (×2): qty 1000
  Filled 2020-06-03 (×2): qty 948

## 2020-06-03 MED ORDER — OSMOLITE 1.2 CAL PO LIQD: 780.0000 mL | ORAL | Status: DC

## 2020-06-03 NOTE — Progress Notes (Signed)
  Speech Language Pathology Treatment: Cognitive-Linquistic  Patient Details Name: Alan Everett MRN: [id number] DOB: 17-Mar-1956 Today's Date: 06/03/2020 Time: 0037-0488 SLP Time Calculation (min) (ACUTE ONLY): 20 min  Assessment / Plan / Recommendation Clinical Impression  Pt was seen for cognitive-linguistic treatment with visitor at bedside, but he was rather lethargic. He demonstrated increased confusion and difficulty following directions throughout the session. SLP attempted to facilitate pt's language with automatic speech and repetition tasks. However, even with max multimodal cueing and direct modeling, pt would not participate. Pt did ocassionally produce short utterances of spontaneous speech, but they were largely unintelligible. SLP will continue to f/u acutely.  HPI HPI: Alan Everett is a 64 y.o. male with a PMHx of alcohol use and tobacco abuse who presents acutely to the ED via EMS as a Code Stroke for acute onset of right sided weakness. Patient with disoriented, right leg weakness, right limb ataxia, right decreased sensation and Global aphasia on exam. 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 Worsening of intraventricular extension.  MBS 10/19 mod-severe dysphagia, continue NPO. PEG 10/22.  Repeat MBS 10/30 with improvements. Started dys1/nectar 11/2.       SLP Plan  Continue with current plan of care       Recommendations                   Oral Care Recommendations: Oral care BID Follow up Recommendations: Skilled Nursing facility SLP Visit Diagnosis: Aphasia (R47.01) Plan: Continue with current plan of care       GO                [name] 06/03/2020, 2:09 PM

## 2020-06-03 NOTE — Progress Notes (Signed)
Nutrition Follow-up  DOCUMENTATION CODES:   Severe malnutrition in context of social or environmental circumstances  INTERVENTION:  Transition pt to nocturnal TF via PEG to stimulate appetite during the day: -Osmolite 1.5 cal @ 51ml/hr from 1800-0600 -Free water per MD, currently Q4H  Nocturnal TF regimen will provide 1170 kcals (meets 65% minimum calorie needs), 48 grams protein, free water  Will also provide pt with H[employer] shake po TID, each supplement provides 500 kcal and 22 grams of protien  NUTRITION DIAGNOSIS:   Severe Malnutrition related to social / environmental circumstances as evidenced by severe fat depletion, severe muscle depletion.  ongoing  GOAL:   Patient will meet greater than or equal to 90% of their needs  Will address with TF and supplements  MONITOR:   TF tolerance, Diet advancement, Weight trends, Skin  REASON FOR ASSESSMENT:   Consult, New TF Enteral/tube feeding initiation and management  ASSESSMENT:   64 yo male presents with acute onset of right sided weakness and mixed recept and expressive aphasia and admitted with acute left thalamocapsular ICH, severe HTN. PMH includes EtOH and tobacco abuse   10/06 Admitted 10/08 Cortrak placed(gastric) 10/13 repeat CT head showed stable L thalamic hemorrhageand intraventricular extension since 05/08/2020. Regional edema, mild regional mass-effect and mild lateral ventriculomegaly and trace SAH. 10/19 failed MBS 10/22 s/p PEG  Pt continues to have recurrent blood loss anemia. GI following. Currently endoscopic evaluation on hold.   Per MD, plans to readdress GOC with pt/family.   Pt's diet was advanced to Dysphagia 1 with Nectar thick liquids on 11/2. Pt has had 2 meals documented since having his diet advanced -- documented as 45% and 35% meal completion. Spoke with RN who reports pt didn't do great with breakfast, but seems to be doing well with lunch so far. Will transition pt to  nocturnal TF to stimulate appetite during the day and will order oral nutrition supplements for pt. Discussed plan with RN. Will monitor for adequacy of po intake and make adjustments to the nutrition plan of care as appropriate.   Pt's current TF orders are for Osmolite 1.2 cal @ 65, free water Q4H,   Admit wt: 65.7 kg Current wt: 65.2 kg  UOP: x24 hours  Labs: CBGs 137-146-102 Medications: ss novolog, 15 units lantus daily, protonix, miralax, senokot  Diet Order:   Diet Order            DIET - DYS 1 Room service appropriate? No; Fluid consistency: Nectar Thick  Diet effective now                 EDUCATION NEEDS:   Not appropriate for education at this time  Skin:  Skin Assessment: Skin Integrity Issues: Skin Integrity Issues:: Stage II, Other (Comment) Stage II: R buttocks Other: puncture abdomen  Last BM:  11/3  Height:   Ht Readings from Last 1 Encounters:  04/09/20 5\' 6"  (1.676 m)    Weight:   Wt Readings from Last 1 Encounters:  06/02/20 65.2 kg    Ideal Body Weight:     BMI:  Body mass index is 23.2 kg/m.  Estimated Nutritional Needs:   Kcal:  1780-1980 kcals  Protein:  85-95 g  Fluid:  >/= 1.8 L    13/03/21, MS, RD, LDN RD pager number and weekend/on-call pager number located in [place].

## 2020-06-03 NOTE — Progress Notes (Signed)
PROGRESS NOTE  Alan Everett  DOB: 1956/07/31  PCP: Patient, No Pcp Per DJS:[id number]  DOA: 05/05/2020  LOS: 29 days   Chief Complaint  Patient presents with  . Code Stroke   Brief narrative: 64 y.o.malewith a PMHx of alcohol use and tobacco abuse. Patient presented to the ED via EMS on 10/6 as a Code Stroke for acute onset of right sided weakness. He also had sensory loss and weakness involving his RUE and RLE.  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  CT head revealed an enlarging L thalamocapsular IPH w/ increasing IVH from L ventricle into 3rd and 4th ventricles. Neurosurgery was consulted for EVD consideration which was then deferred.  Likely the stroke was in the setting of alcohol and cocaine use.  UDS positive for cocaine.    On 05/08/2020, patient spiked fever and he was started on broad-spectrum antibiotics/IV Zosyn empirically. Despite of antibiotics, patient's white blood cells as well as fever continue to get worsen. CT abdomen chest and pelvis was obtained which showed bibasilar infiltrates suspecting possible pneumonia but no other pathology.  2D EchoEF 60-65%.   Hospital course complicated by recurrent blood loss anemia.  Therefore GI reconsulted-but still recommending conservative management for now.  Subjective: Patient was seen and examined this morning. Middle-aged African-American male.  Looks older for his age.  Lying on bed.   Alert, awake.   Able to verbalize words but not able to answer appropriately.  Unable to follow motor commands. PEG tube feeding ongoing. Not on supplemental oxygen.  Assessment/Plan: Acute Hemorrhagic stroke/cerebral edema -Likely in the setting of severe hypertension, alcohol and cocaine use. -Previously was discussed with neurosurgery and neurology. -Subsequent CT scans of the head showed a stable findings.   -He is alert and awake but has global aphasia, unable to follow any  command. -No change in neurological status in at least last 3 weeks.  Moderate to severe dysphagia -PEG tube placed on 05/21/2020. -Monitor for aspiration-currently does not have any cough  Acute on chronic anemia -Total of 6 units of PRBC transfusions given in this admission. -Hemoglobin 8.4 on last check this morning.  Continue to monitor. -IV iron given on 10/31 -GI consult appreciated.  Currently endoscopic evaluation is on hold. -Continue PPI twice daily. -Ferritin level was low at 3 in August 2021 and better at 254 this admission.  Klebsiella UTI  -Urine culture report from 10/10 with more than 1000 CFU per mL of Klebsiella pneumoniae.  Complete 5-day course of IV Rocephin.   Sinus tachycardia Essential hypertension -Heart rate and blood pressure both are better after Coreg 3.125 mg twice daily once started on 11/3.  Hyperlipidemia -LDL less than 70  Hypernatremia -Improving trend.  Currently getting free water with tube feeding.  Continue the same. Recent Labs  Lab 05/28/20 0242 05/29/20 0653 05/30/20 0217 05/31/20 0243 06/01/20 0519 06/02/20 0246  NA 156* 153* 150* 143 140 138   Elevated liver enzymes -Right upper quadrant ultrasound-cholelithiasis without acute cholecystitis -Repeat liver enzymes tomorrow. Recent Labs  Lab 05/28/20 0242  AST 88*  ALT 105*  ALKPHOS 159*  BILITOT 0.7  PROT 7.4  ALBUMIN 1.9*   Tobacco Abuse -Counseled to quit using this  Cocaine abuse -Counseled to quit using this  GERD -PPI twice daily  Goals of care -Poor neurological recovery so far.  Poor expectation of further neurological recovery -Palliative care was consulted in the past.  Patient remains full code.  Will discuss with family.  Mobility: PT eval obtained Code Status:   Code Status: Full Code  Nutritional status: Body mass index is 23.2 kg/m. Nutrition Problem: Severe Malnutrition Etiology: social / environmental circumstances Signs/Symptoms:  severe fat depletion, severe muscle depletion Diet Order            DIET - DYS 1 Room service appropriate? No; Fluid consistency: Nectar Thick  Diet effective now                 DVT prophylaxis: SCD's Start: 05/05/20 1938   Antimicrobials:  None Fluid: None Consultants: Neurology Family Communication:  Not at bedside  Status is: Inpatient  Remains inpatient appropriate because:Unsafe d/c plan   Dispo: The patient is from: Home              Anticipated d/c is to: SNF              Anticipated d/c date is: Whenever bed is available              Patient currently is medically stable to d/c.       Infusions:    Scheduled Meds: . carvedilol  3.125 mg Per Tube BID WC  . chlorhexidine  15 mL Mouth Rinse BID  . feeding supplement (OSMOLITE 1.5 CAL)  780 mL Per Tube Q24H  . free water  200 mL Per Tube Q4H  . insulin aspart  0-9 Units Subcutaneous Q4H  . insulin glargine  15 Units Subcutaneous Daily  . mouth rinse  15 mL Mouth Rinse q12n4p  . pantoprazole sodium  40 mg Per Tube BID  . polyethylene glycol powder  0.5 Container Per Tube Once  . sennosides  5 mL Per Tube BID  . sodium chloride flush  10-40 mL Intracatheter Q12H    Antimicrobials: Anti-infectives (From admission, onward)   Start     Dose/Rate Route Frequency Ordered Stop   05/28/20 0900  cefTRIAXone (ROCEPHIN) 1 g in sodium chloride 0.9 % 100 mL IVPB        1 g 200 mL/hr over 30 Minutes Intravenous Every 24 hours 05/28/20 0807 06/01/20 0855   05/21/20 1515  ceFAZolin (ANCEF) IVPB 2g/100 mL premix        2 g 200 mL/hr over 30 Minutes Intravenous To Radiology 05/21/20 1429 05/21/20 2100   05/14/20 1000  cefTRIAXone (ROCEPHIN) 2 g in sodium chloride 0.9 % 100 mL IVPB        2 g 200 mL/hr over 30 Minutes Intravenous Daily 05/13/20 1454 05/15/20 1255   05/13/20 2200  metroNIDAZOLE (FLAGYL) tablet 500 mg        500 mg Per Tube Every 8 hours 05/13/20 1847 05/15/20 2219   05/13/20 1400  metroNIDAZOLE (FLAGYL)  tablet 500 mg  Status:  Discontinued        500 mg Oral Every 8 hours 05/13/20 0935 05/13/20 1847   05/12/20 0900  cefTRIAXone (ROCEPHIN) 1 g in sodium chloride 0.9 % 100 mL IVPB  Status:  Discontinued        1 g 200 mL/hr over 30 Minutes Intravenous Daily 05/12/20 0817 05/13/20 1454   05/12/20 0900  azithromycin (ZITHROMAX) 500 mg in sodium chloride 0.9 % 250 mL IVPB  Status:  Discontinued        500 mg 250 mL/hr over 60 Minutes Intravenous Daily 05/12/20 0817 05/13/20 0935   05/09/20 0930  Ampicillin-Sulbactam (UNASYN) 3 g in sodium chloride 0.9 % 100 mL IVPB  Status:  Discontinued  3 g 200 mL/hr over 30 Minutes Intravenous Every 8 hours 05/09/20 0837 05/09/20 0839   05/09/20 0930  Ampicillin-Sulbactam (UNASYN) 3 g in sodium chloride 0.9 % 100 mL IVPB  Status:  Discontinued        3 g 200 mL/hr over 30 Minutes Intravenous Every 6 hours 05/09/20 0839 05/12/20 0817      PRN meds: acetaminophen **OR** acetaminophen (TYLENOL) oral liquid 160 mg/5 mL **OR** acetaminophen, hydrALAZINE, levalbuterol, sodium chloride flush   Objective: Vitals:   06/03/20 0721 06/03/20 1156  BP: 128/76 119/81  Pulse: 99 95  Resp: 20 20  Temp: 98.7 F (37.1 C) 98 F (36.7 C)  SpO2: 100% 99%    Intake/Output Summary (Last 24 hours) at 06/03/2020 1428 Last data filed at 06/03/2020 0835 Gross per 24 hour  Intake 3477.83 ml  Output 800 ml  Net 2677.83 ml   Filed Weights   05/29/20 0411 06/01/20 0314 06/02/20 0339  Weight: 61.6 kg 65.3 kg 65.2 kg   Weight change:  Body mass index is 23.2 kg/m.   Physical Exam: General exam: Appears calm and comfortable.  Not in physical distress Skin: No rashes, lesions or ulcers. HEENT: Atraumatic, normocephalic, supple neck, no obvious bleeding Lungs: Clear to auscultation bilaterally CVS: Regular rate and rhythm, no murmur GI/Abd soft, continue, nondistended, bowel sound present.  PEG tube site intact. CNS: Alert, awake, able to produce some words but  unable to give appropriate response Psychiatry: Mood appropriate Extremities: No pedal edema, no calf tenderness  Data Review: I have personally reviewed the laboratory data and studies available.  Recent Labs  Lab 05/30/20 0217 05/31/20 0243 06/01/20 0519 06/02/20 0246 06/03/20 0136  WBC 13.9* 9.7 9.3 10.9* 10.8*  NEUTROABS 11.3* 6.9 6.5 8.2* 7.3  HGB 8.8* 8.5* 8.9* 8.4* 8.0*  HCT 29.3* 27.2* 29.4* 27.5* 26.8*  MCV 97.0 96.8 96.7 100.0 98.9  PLT 189 198 201 198 198   Recent Labs  Lab 05/29/20 0409 05/29/20 0653 05/30/20 0217 05/31/20 0243 06/01/20 0519 06/02/20 0246 06/03/20 0136  NA  --  153* 150* 143 140 138  --   K  --  3.9 3.9 3.7 4.1 4.0  --   CL  --  123* 123* 117* 112* 109  --   CO2  --  21* 23 20* 20* 20*  --   GLUCOSE  --  129* 143* 135* 110* 138*  --   BUN  --  43* 31* 23 17 19   --   CREATININE  --  1.28* 1.04 0.89 0.74 0.80  --   CALCIUM  --  8.7* 8.7* 8.5* 8.6* 8.5*  --   MG   < >  --  2.2 2.2 1.9 1.9 1.9  PHOS  --   --   --   --   --   --  3.3   < > = values in this interval not displayed.    F/u labs ordered  Signed, , MD [facility] 06/03/2020

## 2020-06-03 NOTE — Progress Notes (Signed)
Physical Therapy Treatment Patient Details Name: Alan Everett MRN: [id number] DOB: 10-02-1955 Today's Date: 06/03/2020    History of Present Illness Patient is a 64 y/o male with PMH ETOH use, tobacco abuse, admitted with R side weakness,sensory loss and speech deficits.  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    PT Comments    Pt tolerates treatment well, however continues to require significant assistance to perform all functional mobility tasks and remains inconsistent with motor command following. Pt follows ~50% of motor commands with L side, unable to move R side at this time. Pt demonstrates impaired initiation and sequencing throughout mobility. Pt does demonstrate some improvement in sitting balance when able to utilize UE support. Pt will benefit from continued acute PT services to reduce falls risk and caregiver burden. PT continues to recommend SNF placement at this time.   Follow Up Recommendations  SNF;Supervision/Assistance - 24 hour     Equipment Recommendations  Wheelchair cushion (measurements PT);Wheelchair (measurements PT);Hospital bed;Other (comment)    Recommendations for Other Services       Precautions / Restrictions Precautions Precautions: Fall Restrictions Weight Bearing Restrictions: No    Mobility  Bed Mobility Overal bed mobility: Needs Assistance Bed Mobility: Supine to Sit;Sit to Supine;Rolling Rolling: Max assist   Supine to sit: Total assist Sit to supine: Max assist      Transfers Overall transfer level: Needs assistance Equipment used: 1 person hand held assist Transfers: Sit to/from Stand Sit to Stand: Max assist;From elevated surface         General transfer comment: PT provides knee block and BUE support, pt requiring significant assistance to stand, unable to fully extend hips and cervical spine during 4 sit to stand attempts. Roughly 75% erect standing position  obtained  Ambulation/Gait                 Stairs             Wheelchair Mobility    Modified Rankin (Stroke Patients Only) Modified Rankin (Stroke Patients Only) Pre-Morbid Rankin Score: No symptoms Modified Rankin: Severe disability     Balance Overall balance assessment: Needs assistance Sitting-balance support: Feet supported;Single extremity supported Sitting balance-Leahy Scale: Poor Sitting balance - Comments: minA with UE LUE support of railing, otherwise mod-madA without UE support Postural control: Right lateral lean Standing balance support: Bilateral upper extremity supported Standing balance-Leahy Scale: Zero Standing balance comment: max-totalA for static standing balance                            Cognition Arousal/Alertness: Awake/alert Behavior During Therapy: Flat affect Overall Cognitive Status: Difficult to assess                                 General Comments: pt follows <50% of simple one step verbal commands, only doing so with L side. Pt demonstrates poor awareness of deficits.      Exercises      General Comments General comments (skin integrity, edema, etc.): VSS on RA      Pertinent Vitals/Pain Pain Assessment: Faces Faces Pain Scale: No hurt    Home Living                      Prior Function            PT Goals (current  goals can now be found in the care plan section) Acute Rehab PT Goals Patient Stated Goal: unable to state Progress towards PT goals: Not progressing toward goals - comment    Frequency    Min 3X/week      PT Plan Current plan remains appropriate    Co-evaluation              AM-PAC PT "6 Clicks" Mobility   Outcome Measure  Help needed turning from your back to your side while in a flat bed without using bedrails?: Total Help needed moving from lying on your back to sitting on the side of a flat bed without using bedrails?: Total Help needed  moving to and from a bed to a chair (including a wheelchair)?: Total Help needed standing up from a chair using your arms (e.g., wheelchair or bedside chair)?: Total Help needed to walk in hospital room?: Total Help needed climbing 3-5 steps with a railing? : Total 6 Click Score: 6    End of Session   Activity Tolerance: Patient tolerated treatment well Patient left: in bed;with call bell/phone within reach;with bed alarm set;with restraints reapplied Nurse Communication: Mobility status;Need for lift equipment PT Visit Diagnosis: Unsteadiness on feet (R26.81);Muscle weakness (generalized) (M62.81);Difficulty in walking, not elsewhere classified (R26.2);Other symptoms and signs involving the nervous system (R29.898);Hemiplegia and hemiparesis Hemiplegia - Right/Left: Right Hemiplegia - dominant/non-dominant: Dominant Hemiplegia - caused by: Nontraumatic intracerebral hemorrhage     Time: 0802-0827 PT Time Calculation (min) (ACUTE ONLY): 25 min  Charges:  $Therapeutic Activity: 23-37 mins                     Arlyss Gandy, PT, DPT Acute Rehabilitation Pager: [phone]    Arlyss Gandy 06/03/2020, 12:22 PM

## 2020-06-04 LAB — GLUCOSE, CAPILLARY
Glucose-Capillary: 121 mg/dL — ABNORMAL HIGH (ref 70–99)
Glucose-Capillary: 126 mg/dL — ABNORMAL HIGH (ref 70–99)
Glucose-Capillary: 134 mg/dL — ABNORMAL HIGH (ref 70–99)
Glucose-Capillary: 140 mg/dL — ABNORMAL HIGH (ref 70–99)
Glucose-Capillary: 143 mg/dL — ABNORMAL HIGH (ref 70–99)
Glucose-Capillary: 145 mg/dL — ABNORMAL HIGH (ref 70–99)

## 2020-06-04 LAB — COMPREHENSIVE METABOLIC PANEL
ALT: 116 U/L — ABNORMAL HIGH (ref 0–44)
AST: 86 U/L — ABNORMAL HIGH (ref 15–41)
Albumin: 1.8 g/dL — ABNORMAL LOW (ref 3.5–5.0)
Alkaline Phosphatase: 183 U/L — ABNORMAL HIGH (ref 38–126)
Anion gap: 7 (ref 5–15)
BUN: 17 mg/dL (ref 8–23)
CO2: 23 mmol/L (ref 22–32)
Calcium: 8.4 mg/dL — ABNORMAL LOW (ref 8.9–10.3)
Chloride: 104 mmol/L (ref 98–111)
Creatinine, Ser: 0.78 mg/dL (ref 0.61–1.24)
GFR, Estimated: >60 mL/min (ref >60)
Glucose, Bld: 142 mg/dL — ABNORMAL HIGH (ref 70–99)
Potassium: 3.8 mmol/L (ref 3.5–5.1)
Sodium: 134 mmol/L — ABNORMAL LOW (ref 135–145)
Total Bilirubin: 0.5 mg/dL (ref 0.3–1.2)
Total Protein: 7.5 g/dL (ref 6.5–8.1)

## 2020-06-04 NOTE — NC FL2 (Signed)
[place] MEDICAID FL2 LEVEL OF CARE SCREENING TOOL     IDENTIFICATION  Patient Name: Alan Everett Birthdate: Jul 09, 1956 Sex: male Admission Date (Current Location): 05/05/2020  [facility] and [place] Number:  Chiropodist and Address:  [facility], [address], [place], [place] [address]      Provider Number: [id number]  Attending Physician Name and Address:  Lorin Glass, MD  Relative Name and Phone Number:       Current Level of Care: Hospital Recommended Level of Care: Skilled Nursing Facility Prior Approval Number:    Date Approved/Denied:   PASRR Number:    Discharge Plan: SNF    Current Diagnoses: Patient Active Problem List   Diagnosis Date Noted  . Pressure injury of skin 06/02/2020  . Acute lower UTI 05/29/2020  . Acute blood loss anemia 05/29/2020  . Heme positive stool   . Protein-calorie malnutrition, severe 05/21/2020  . Palliative care by specialist   . Goals of care, counseling/discussion   . DNR (do not resuscitate) discussion   . Leukocytosis   . Aspiration pneumonia of both lower lobes (HCC)   . Fever   . SOB (shortness of breath)   . Polysubstance abuse (HCC)   . Dysphagia, post-stroke   . Hyperglycemia   . Hypokalemia   . Hypomagnesemia   . ICH (intracerebral hemorrhage) (HCC) 05/05/2020  . Iron deficiency 02/29/2020  . Symptomatic anemia 02/28/2020  . Microcytic anemia 02/28/2020  . Alcohol use 02/28/2020  . Tobacco abuse     Orientation RESPIRATION BLADDER Height & Weight        Normal Incontinent Weight: 65.2 kg Height:     BEHAVIORAL SYMPTOMS/MOOD NEUROLOGICAL BOWEL NUTRITION STATUS      Incontinent Diet, Feeding tube (dysphagia 1 with nectar thick liquids// G tube--Osmolite 1.5 65 cc/ hour from 1800-0600)  AMBULATORY STATUS COMMUNICATION OF NEEDS Skin   Total Care Verbally Skin abrasions (abrasion to rt buttock)                       Personal Care Assistance Level of Assistance   Bathing, Feeding, Dressing Bathing Assistance: Maximum assistance Feeding assistance: Maximum assistance Dressing Assistance: Maximum assistance     Functional Limitations Info  Sight, Hearing, Speech Sight Info: Impaired Hearing Info: Adequate Speech Info: Impaired    SPECIAL CARE FACTORS FREQUENCY  PT (By licensed PT), OT (By licensed OT), Speech therapy     PT Frequency: 5x/wk OT Frequency: 5x/wk     Speech Therapy Frequency: 5x/wk      Contractures Contractures Info: Not present    Additional Factors Info  Suctioning Needs, Code Status, Allergies, Insulin Sliding Scale Code Status Info: Full Allergies Info: NKA   Insulin Sliding Scale Info: Novolog 0-9 units SQ every 4 hours   Suctioning Needs: oral as needed   Current Medications (06/04/2020):  This is the current hospital active medication list Current Facility-Administered Medications  Medication Dose Route Frequency Provider Last Rate Last Admin  . acetaminophen (TYLENOL) tablet 650 mg  650 mg Per Tube Q4H PRN Royce Macadamia, RPH       Or  . acetaminophen (TYLENOL) 160 MG/5ML solution 650 mg  650 mg Per Tube Q4H PRN Royce Macadamia, RPH   650 mg at 05/29/20 1517   Or  . acetaminophen (TYLENOL) suppository 650 mg  650 mg Rectal Q4H PRN Royce Macadamia, RPH      . carvedilol (COREG) tablet 3.125 mg  3.125  mg Per Tube BID WC Dahal, Melina Schools, MD   3.125 mg at 06/03/20 1751  . chlorhexidine (PERIDEX) 0.12 % solution 15 mL  15 mL Mouth Rinse BID Marvel Plan, MD   15 mL at 06/03/20 2112  . feeding supplement (OSMOLITE 1.5 CAL) liquid 780 mL  780 mL Per Tube Q24H Lorin Glass, MD 65 mL/hr at 06/03/20 2112 780 mL at 06/03/20 2112  . free water 200 mL  200 mL Per Tube Q4H Amin, Ankit Chirag, MD   200 mL at 06/04/20 0534  . hydrALAZINE (APRESOLINE) injection 5-10 mg  5-10 mg Intravenous Q2H PRN Marvel Plan, MD   5 mg at 05/08/20 2011  . insulin aspart (novoLOG) injection 0-9 Units  0-9 Units Subcutaneous Q4H  Shalhoub, Deno Lunger, MD   1 Units at 06/04/20 0533  . insulin glargine (LANTUS) injection 15 Units  15 Units Subcutaneous Daily Hughie Closs, MD   15 Units at 06/03/20 0835  . levalbuterol (XOPENEX) nebulizer solution 0.63 mg  0.63 mg Nebulization Q6H PRN Hughie Closs, MD      . MEDLINE mouth rinse  15 mL Mouth Rinse q12n4p Marvel Plan, MD   15 mL at 06/03/20 1753  . pantoprazole sodium (PROTONIX) 40 mg/20 mL oral suspension 40 mg  40 mg Per Tube BID Marguerita Merles [place], DO   40 mg at 06/03/20 2150  . polyethylene glycol powder (GLYCOLAX/MIRALAX) container 127.5 g  0.5 Container Per Tube Once Dianah Field, PA-C      . sennosides (SENOKOT) 8.8 MG/5ML syrup 5 mL  5 mL Per Tube BID Lorin Glass, MD   5 mL at 06/03/20 2149  . sodium chloride flush (NS) 0.9 % injection 10-40 mL  10-40 mL Intracatheter Q12H Annie Main L, NP   10 mL at 06/03/20 2152  . sodium chloride flush (NS) 0.9 % injection 10-40 mL  10-40 mL Intracatheter PRN Layne Benton, NP   10 mL at 06/02/20 2139     Discharge Medications: Please see discharge summary for a list of discharge medications.  Relevant Imaging Results:  Relevant Lab Results:   Additional Information SS#: [id number]--UXLK be LOG with disability pending/ will need palliative to follow at facility  Kermit Balo, RN

## 2020-06-04 NOTE — Progress Notes (Signed)
Occupational Therapy Treatment Patient Details Name: Alan Everett MRN: [id number] DOB: 10/05/1955 Today's Date: 06/04/2020    History of present illness Patient is a 64 y/o male with PMH ETOH use, tobacco abuse, admitted with R side weakness,sensory loss and speech deficits.  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   OT comments  Pt progressing slowly towards acute OT goals. Seen at bed level as only +1 assist available. Pt able to initiate washing face and complete using LUE, would need max A for throughness of grooming tasks. Pt continues with strong left gaze preference and R side neglect. He was was to visually attend to something just right of midline with max verbal cueing about 25% of the time. D/c plan remains appropriate. \   Follow Up Recommendations  SNF    Equipment Recommendations  Wheelchair (measurements OT);Wheelchair cushion (measurements OT);Hospital bed    Recommendations for Other Services      Precautions / Restrictions Precautions Precautions: Fall Precaution Comments: L handmitt, G tube, no functional use of RUE; R side neglect Restrictions Weight Bearing Restrictions: No       Mobility Bed Mobility               General bed mobility comments: did not attempt as only +1 assist available  Transfers                      Balance                                           ADL either performed or assessed with clinical judgement   ADL Overall ADL's : Needs assistance/impaired     Grooming: Maximal assistance;Bed level;Wash/dry face Grooming Details (indicate cue type and reason): initiates washing face and able to complete using LUE only. Max A for throughness of grooming tasks as a whole. Completed at bed level                               General ADL Comments: pt is total (A) for all adls.     Vision   Vision Assessment?: Vision  impaired- to be further tested in functional context Additional Comments: strong left gaze preference but occasionally will turn head to visually attend to something just right of midline   Perception     Praxis      Cognition Arousal/Alertness: Awake/alert Behavior During Therapy: Flat affect (smiled 2x during session otherwise flat affect) Overall Cognitive Status: Difficult to assess                                 General Comments: able to convey yes/no for grooming task decisions. Did state 1x "I don't want that" in reference to presention of hair comb. Receptive and expressive aphasia. Follows simple one step commands about 50% of the time.         Exercises Exercises: Other exercises Other Exercises Other Exercises: PROM RUE/RLE   Shoulder Instructions       General Comments      Pertinent Vitals/ Pain       Pain Assessment: Faces Faces Pain Scale: Hurts a little bit Pain Location: RLE during ROM Pain Descriptors / Indicators: Restless Pain Intervention(s): Monitored  during session  Home Living                                          Prior Functioning/Environment              Frequency  Min 2X/week        Progress Toward Goals  OT Goals(current goals can now be found in the care plan section)  Progress towards OT goals: Progressing toward goals  Acute Rehab OT Goals Patient Stated Goal: unable to state OT Goal Formulation: Patient unable to participate in goal setting Time For Goal Achievement: 06/09/20 Potential to Achieve Goals: Fair ADL Goals Pt Will Perform Grooming: with min assist;sitting Pt Will Transfer to Toilet: with max assist;with transfer board;bedside commode Pt/caregiver will Perform Home Exercise Program:  ( Pt will tolerate soft elbow splint on RUE to help with tone) Additional ADL Goal #1: pt will look at object in midline 50% of session Additional ADL Goal #2: Pt will be able to track right and  left 5 times in succesion with increased time Additional ADL Goal #3: Pt will follow 3 out of 5 one step commands Additional ADL Goal #4: pt will sit EOB min (A) as prep for transfers  Plan Discharge plan remains appropriate    Co-evaluation                 AM-PAC OT "6 Clicks" Daily Activity     Outcome Measure   Help from another person eating meals?: Total Help from another person taking care of personal grooming?: A Lot Help from another person toileting, which includes using toliet, bedpan, or urinal?: Total Help from another person bathing (including washing, rinsing, drying)?: Total Help from another person to put on and taking off regular upper body clothing?: Total Help from another person to put on and taking off regular lower body clothing?: Total 6 Click Score: 7    End of Session    OT Visit Diagnosis: Other abnormalities of gait and mobility (R26.89);Muscle weakness (generalized) (M62.81);Low vision, both eyes (H54.2);Other symptoms and signs involving cognitive function;Hemiplegia and hemiparesis Hemiplegia - Right/Left: Right Hemiplegia - dominant/non-dominant: Dominant   Activity Tolerance Patient tolerated treatment well   Patient Left in bed;with call bell/phone within reach;with bed alarm set;with restraints reapplied;with SCD's reapplied   Nurse Communication          Time: 5732-2025 OT Time Calculation (min): 22 min  Charges: OT General Charges $OT Visit: 1 Visit OT Treatments $Self Care/Home Management : 8-22 mins  Raynald Kemp, OT Acute Rehabilitation Services Pager: [phone] Office: [phone]    Pilar Grammes 06/04/2020, 3:01 PM

## 2020-06-04 NOTE — Progress Notes (Signed)
PROGRESS NOTE  Alan Everett  DOB: 1956/02/02  PCP: Patient, No Pcp Per [id number]  DOA: 05/05/2020  LOS: 30 days   Chief Complaint  Patient presents with  . Code Stroke   Brief narrative: 64 y.o.malewith a PMHx of alcohol use and tobacco abuse. Patient presented to the ED via EMS on 10/6 as a Code Stroke for acute onset of right sided weakness. He also had sensory loss and weakness involving his RUE and RLE.  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  CT head revealed an enlarging L thalamocapsular IPH w/ increasing IVH from L ventricle into 3rd and 4th ventricles. Neurosurgery was consulted for EVD consideration which was then deferred.  Likely the stroke was in the setting of alcohol and cocaine use.  UDS positive for cocaine.    On 05/08/2020, patient spiked fever and he was started on broad-spectrum antibiotics/IV Zosyn empirically. Despite of antibiotics, patient's white blood cells as well as fever continue to get worsen. CT abdomen chest and pelvis was obtained which showed bibasilar infiltrates suspecting possible pneumonia but no other pathology.  2D EchoEF 60-65%.   Hospital course complicated by recurrent blood loss anemia.  Therefore GI reconsulted-but still recommending conservative management for now.  Subjective: Patient was seen and examined this morning. Awake, breathing comfortably on room air.  Unable to answer appropriately.  PEG tube feeding ongoing. Essentially no change in status in last 24 hours.  Assessment/Plan: Acute Hemorrhagic stroke/cerebral edema -Likely in the setting of severe hypertension, alcohol and cocaine use. -Previously was discussed with neurosurgery and neurology. -Subsequent CT scans of the head showed a stable findings.   -He is alert and awake but has global aphasia, unable to follow any command. -No change in neurological status in at least last 3 weeks.  Moderate to severe  dysphagia -PEG tube placed on 05/21/2020. -Monitor for aspiration-currently does not have any cough  Acute on chronic anemia -Total of 6 units of PRBC transfusions given in this admission. -Hemoglobin 8.4 on last check this morning.  Continue to monitor. -IV iron given on 10/31 -GI consult appreciated.  Currently endoscopic evaluation is on hold. -Continue PPI twice daily. -Ferritin level was low at 3 in August 2021 and better at 254 this admission.  Klebsiella UTI  -Urine culture report from 10/10 with more than 1000 CFU per mL of Klebsiella pneumoniae.  Complete 5-day course of IV Rocephin.   Sinus tachycardia Essential hypertension -Heart rate and blood pressure both are better after Coreg 3.125 mg twice daily once started on 11/3.  Hyperlipidemia -LDL less than 70  Hypernatremia -Improving trend.  Currently getting free water with tube feeding.  Continue the same. Recent Labs  Lab 05/29/20 0653 05/30/20 0217 05/31/20 0243 06/01/20 0519 06/02/20 0246 06/04/20 0203  NA 153* 150* 143 140 138 134*   Elevated liver enzymes -Right upper quadrant ultrasound-cholelithiasis without acute cholecystitis -Repeat liver enzymes tomorrow. Recent Labs  Lab 06/04/20 0203  AST 86*  ALT 116*  ALKPHOS 183*  BILITOT 0.5  PROT 7.5  ALBUMIN 1.8*   Tobacco Abuse -Counseled to quit using this  Cocaine abuse -Counseled to quit using this  GERD -PPI twice daily  Goals of care -Poor neurological recovery so far.  Poor expectation of further neurological recovery -Palliative care was consulted in the past.  Patient remains full code.  Will discuss with family.  Mobility: PT eval obtained Code Status:   Code Status: Full Code  Nutritional status: Body mass  index is 23.2 kg/m. Nutrition Problem: Severe Malnutrition Etiology: social / environmental circumstances Signs/Symptoms: severe fat depletion, severe muscle depletion Diet Order            DIET - DYS 1 Room service  appropriate? No; Fluid consistency: Nectar Thick  Diet effective now                 DVT prophylaxis: SCD's Start: 05/05/20 1938   Antimicrobials:  None Fluid: None Consultants: Neurology Family Communication:  Not at bedside  Status is: Inpatient  Remains inpatient appropriate because:Unsafe d/c plan   Dispo: The patient is from: Home              Anticipated d/c is to: SNF              Anticipated d/c date is: Whenever bed is available              Patient currently is medically stable to d/c.       Infusions:    Scheduled Meds: . carvedilol  3.125 mg Per Tube BID WC  . chlorhexidine  15 mL Mouth Rinse BID  . feeding supplement (OSMOLITE 1.5 CAL)  780 mL Per Tube Q24H  . free water  200 mL Per Tube Q4H  . insulin aspart  0-9 Units Subcutaneous Q4H  . insulin glargine  15 Units Subcutaneous Daily  . mouth rinse  15 mL Mouth Rinse q12n4p  . pantoprazole sodium  40 mg Per Tube BID  . polyethylene glycol powder  0.5 Container Per Tube Once  . sennosides  5 mL Per Tube BID  . sodium chloride flush  10-40 mL Intracatheter Q12H    Antimicrobials: Anti-infectives (From admission, onward)   Start     Dose/Rate Route Frequency Ordered Stop   05/28/20 0900  cefTRIAXone (ROCEPHIN) 1 g in sodium chloride 0.9 % 100 mL IVPB        1 g 200 mL/hr over 30 Minutes Intravenous Every 24 hours 05/28/20 0807 06/01/20 0855   05/21/20 1515  ceFAZolin (ANCEF) IVPB 2g/100 mL premix        2 g 200 mL/hr over 30 Minutes Intravenous To Radiology 05/21/20 1429 05/21/20 2100   05/14/20 1000  cefTRIAXone (ROCEPHIN) 2 g in sodium chloride 0.9 % 100 mL IVPB        2 g 200 mL/hr over 30 Minutes Intravenous Daily 05/13/20 1454 05/15/20 1255   05/13/20 2200  metroNIDAZOLE (FLAGYL) tablet 500 mg        500 mg Per Tube Every 8 hours 05/13/20 1847 05/15/20 2219   05/13/20 1400  metroNIDAZOLE (FLAGYL) tablet 500 mg  Status:  Discontinued        500 mg Oral Every 8 hours 05/13/20 0935 05/13/20  1847   05/12/20 0900  cefTRIAXone (ROCEPHIN) 1 g in sodium chloride 0.9 % 100 mL IVPB  Status:  Discontinued        1 g 200 mL/hr over 30 Minutes Intravenous Daily 05/12/20 0817 05/13/20 1454   05/12/20 0900  azithromycin (ZITHROMAX) 500 mg in sodium chloride 0.9 % 250 mL IVPB  Status:  Discontinued        500 mg 250 mL/hr over 60 Minutes Intravenous Daily 05/12/20 0817 05/13/20 0935   05/09/20 0930  Ampicillin-Sulbactam (UNASYN) 3 g in sodium chloride 0.9 % 100 mL IVPB  Status:  Discontinued        3 g 200 mL/hr over 30 Minutes Intravenous Every 8 hours 05/09/20 0837 05/09/20 5643  05/09/20 0930  Ampicillin-Sulbactam (UNASYN) 3 g in sodium chloride 0.9 % 100 mL IVPB  Status:  Discontinued        3 g 200 mL/hr over 30 Minutes Intravenous Every 6 hours 05/09/20 0839 05/12/20 0817      PRN meds: acetaminophen **OR** acetaminophen (TYLENOL) oral liquid 160 mg/5 mL **OR** acetaminophen, hydrALAZINE, levalbuterol, sodium chloride flush   Objective: Vitals:   06/04/20 0353 06/04/20 1342  BP: 118/71 134/60  Pulse: 92 92  Resp:  20  Temp: 98.4 F (36.9 C)   SpO2: 99% 100%    Intake/Output Summary (Last 24 hours) at 06/04/2020 1539 Last data filed at 06/04/2020 0354 Gross per 24 hour  Intake --  Output 1100 ml  Net -1100 ml   Filed Weights   05/29/20 0411 06/01/20 0314 06/02/20 0339  Weight: 61.6 kg 65.3 kg 65.2 kg   Weight change:  Body mass index is 23.2 kg/m.   Physical Exam: General exam: Appears calm and comfortable.  Not in physical distress Skin: No rashes, lesions or ulcers. HEENT: Atraumatic, normocephalic, supple neck, no obvious bleeding Lungs: Clear to auscultation bilaterally CVS: Regular rate and rhythm, no murmur GI/Abd soft, continue, nondistended, bowel sound present.  PEG tube site intact. CNS: Alert, awake, able to produce some words but unable to give appropriate response Psychiatry: Mood appropriate Extremities: No pedal edema, no calf  tenderness  Data Review: I have personally reviewed the laboratory data and studies available.  Recent Labs  Lab 05/30/20 0217 05/31/20 0243 06/01/20 0519 06/02/20 0246 06/03/20 0136  WBC 13.9* 9.7 9.3 10.9* 10.8*  NEUTROABS 11.3* 6.9 6.5 8.2* 7.3  HGB 8.8* 8.5* 8.9* 8.4* 8.0*  HCT 29.3* 27.2* 29.4* 27.5* 26.8*  MCV 97.0 96.8 96.7 100.0 98.9  PLT 189 198 201 198 198   Recent Labs  Lab 05/30/20 0217 05/31/20 0243 06/01/20 0519 06/02/20 0246 06/03/20 0136 06/04/20 0203  NA 150* 143 140 138  --  134*  K 3.9 3.7 4.1 4.0  --  3.8  CL 123* 117* 112* 109  --  104  CO2 23 20* 20* 20*  --  23  GLUCOSE 143* 135* 110* 138*  --  142*  BUN 31* 23 17 19   --  17  CREATININE 1.04 0.89 0.74 0.80  --  0.78  CALCIUM 8.7* 8.5* 8.6* 8.5*  --  8.4*  MG 2.2 2.2 1.9 1.9 1.9  --   PHOS  --   --   --   --  3.3  --     F/u labs ordered  Signed, , MD [facility] 06/04/2020

## 2020-06-04 NOTE — Progress Notes (Signed)
PEG tube noted to be clogged upon entering the room. Spent greater than 20 minutes in process of unclogging the tube, flushing/drawing back on the line and was able to dislodge the clog. PEG is now clear and patent. Pt tolerated well, no discomfort, VSS, pt was noted to be smiling/chattering with me during the procedure. TF as ordered were resumed upon resolution of the clog.

## 2020-06-05 LAB — GLUCOSE, CAPILLARY
Glucose-Capillary: 130 mg/dL — ABNORMAL HIGH (ref 70–99)
Glucose-Capillary: 132 mg/dL — ABNORMAL HIGH (ref 70–99)
Glucose-Capillary: 133 mg/dL — ABNORMAL HIGH (ref 70–99)
Glucose-Capillary: 139 mg/dL — ABNORMAL HIGH (ref 70–99)
Glucose-Capillary: 141 mg/dL — ABNORMAL HIGH (ref 70–99)
Glucose-Capillary: 142 mg/dL — ABNORMAL HIGH (ref 70–99)
Glucose-Capillary: 182 mg/dL — ABNORMAL HIGH (ref 70–99)

## 2020-06-05 MED ORDER — CARVEDILOL 6.25 MG PO TABS
6.2500 mg | ORAL_TABLET | Freq: Two times a day (BID) | ORAL | Status: DC
  Administered 2020-06-05 – 2020-06-19 (×30): 6.25 mg
  Filled 2020-06-05 (×31): qty 1

## 2020-06-05 NOTE — Progress Notes (Signed)
PROGRESS NOTE  Alan Everett  DOB: [birth year]  PCP: Patient, No Pcp Per [id number]  DOA: 05/05/2020  LOS: 31 days   Chief Complaint  Patient presents with  . Code Stroke   Brief narrative: 64 y.o.malewith a PMHx of alcohol use and tobacco abuse. Patient presented to the ED via EMS on 10/6 as a Code Stroke for acute onset of right sided weakness. He also had sensory loss and weakness involving his RUE and RLE.  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  CT head revealed an enlarging L thalamocapsular IPH w/ increasing IVH from L ventricle into 3rd and 4th ventricles. Neurosurgery was consulted for EVD consideration which was then deferred.  Likely the stroke was in the setting of alcohol and cocaine use.  UDS positive for cocaine.    On 05/08/2020, patient spiked fever and he was started on broad-spectrum antibiotics/IV Zosyn empirically. Despite of antibiotics, patient's white blood cells as well as fever continue to get worsen. CT abdomen chest and pelvis was obtained which showed bibasilar infiltrates suspecting possible pneumonia but no other pathology.  2D EchoEF 60-65%.   Hospital course complicated by recurrent blood loss anemia.  Therefore GI was reconsulted-but still recommended conservative management.  Subjective: Patient was seen and examined this morning. Awake, breathing comfortably on room air.  Tries to answer questions with incomprehensible sounds but unable to answer with words appropriately.  PEG tube feeding ongoing. Essentially no change in status in last 24 hours.  Assessment/Plan: Acute Hemorrhagic stroke/cerebral edema -Likely in the setting of severe hypertension, alcohol and cocaine use. -Previously was discussed with neurosurgery and neurology. -Subsequent CT scans of the head showed a stable findings.   -He is alert and awake but has global aphasia, unable to follow any command. -No change in  neurological status in at least last 3 weeks.  Moderate to severe dysphagia -PEG tube placed on 05/21/2020. -Monitor for aspiration-currently does not have any cough  Acute on chronic anemia -Total of 6 units of PRBC transfusions given in this admission. -Hemoglobin 8.4 on last check.  Continue to monitor. -IV iron given on 10/31 -GI consult appreciated.  Currently endoscopic evaluation is on hold. -Continue PPI twice daily. -Ferritin level was low at 3 in August 2021 and better at 254 this admission.  Klebsiella UTI  -Urine culture report from 10/10 with more than 1000 CFU per mL of Klebsiella pneumoniae.  Completed 5-day course of IV Rocephin.   Sinus tachycardia Essential hypertension -Heart rate and blood pressure both are better after Coreg 3.125 mg twice daily once started on 11/3.  Hyperlipidemia -LDL less than 70  Hypernatremia -Improving trend.  Currently getting free water with tube feeding.  Continue the same. Recent Labs  Lab 05/30/20 0217 05/31/20 0243 06/01/20 0519 06/02/20 0246 06/04/20 0203  NA 150* 143 140 138 134*   Elevated liver enzymes -Right upper quadrant ultrasound-cholelithiasis without acute cholecystitis -Repeat liver enzymes tomorrow. Recent Labs  Lab 06/04/20 0203  AST 86*  ALT 116*  ALKPHOS 183*  BILITOT 0.5  PROT 7.5  ALBUMIN 1.8*   Tobacco Abuse -Counseled to quit using this  Cocaine abuse -Counseled to quit using this  GERD -PPI twice daily  Goals of care -Poor neurological recovery so far.  Poor expectation of further neurological recovery -Palliative care was consulted in the past.  Patient remains full code.  Will discuss with family.  Mobility: PT eval obtained Code Status:   Code Status: Full Code  Nutritional status: Body mass index is 22.49 kg/m. Nutrition Problem: Severe Malnutrition Etiology: social / environmental circumstances Signs/Symptoms: severe fat depletion, severe muscle depletion Diet Order             DIET - DYS 1 Room service appropriate? No; Fluid consistency: Nectar Thick  Diet effective now                 DVT prophylaxis: SCD's Start: 05/05/20 1938   Antimicrobials:  None Fluid: None Consultants: Neurology Family Communication:  Not at bedside  Status is: Inpatient  Remains inpatient appropriate because:Unsafe d/c plan   Dispo: The patient is from: Home              Anticipated d/c is to: SNF              Anticipated d/c date is: Whenever bed is available              Patient currently is medically stable to d/c.   Infusions:    Scheduled Meds: . carvedilol  6.25 mg Per Tube BID WC  . chlorhexidine  15 mL Mouth Rinse BID  . feeding supplement (OSMOLITE 1.5 CAL)  780 mL Per Tube Q24H  . free water  200 mL Per Tube Q4H  . insulin aspart  0-9 Units Subcutaneous Q4H  . insulin glargine  15 Units Subcutaneous Daily  . mouth rinse  15 mL Mouth Rinse q12n4p  . pantoprazole sodium  40 mg Per Tube BID  . polyethylene glycol powder  0.5 Container Per Tube Once  . sennosides  5 mL Per Tube BID  . sodium chloride flush  10-40 mL Intracatheter Q12H    Antimicrobials: Anti-infectives (From admission, onward)   Start     Dose/Rate Route Frequency Ordered Stop   05/28/20 0900  cefTRIAXone (ROCEPHIN) 1 g in sodium chloride 0.9 % 100 mL IVPB        1 g 200 mL/hr over 30 Minutes Intravenous Every 24 hours 05/28/20 0807 06/01/20 0855   05/21/20 1515  ceFAZolin (ANCEF) IVPB 2g/100 mL premix        2 g 200 mL/hr over 30 Minutes Intravenous To Radiology 05/21/20 1429 05/21/20 2100   05/14/20 1000  cefTRIAXone (ROCEPHIN) 2 g in sodium chloride 0.9 % 100 mL IVPB        2 g 200 mL/hr over 30 Minutes Intravenous Daily 05/13/20 1454 05/15/20 1255   05/13/20 2200  metroNIDAZOLE (FLAGYL) tablet 500 mg        500 mg Per Tube Every 8 hours 05/13/20 1847 05/15/20 2219   05/13/20 1400  metroNIDAZOLE (FLAGYL) tablet 500 mg  Status:  Discontinued        500 mg Oral Every 8  hours 05/13/20 0935 05/13/20 1847   05/12/20 0900  cefTRIAXone (ROCEPHIN) 1 g in sodium chloride 0.9 % 100 mL IVPB  Status:  Discontinued        1 g 200 mL/hr over 30 Minutes Intravenous Daily 05/12/20 0817 05/13/20 1454   05/12/20 0900  azithromycin (ZITHROMAX) 500 mg in sodium chloride 0.9 % 250 mL IVPB  Status:  Discontinued        500 mg 250 mL/hr over 60 Minutes Intravenous Daily 05/12/20 0817 05/13/20 0935   05/09/20 0930  Ampicillin-Sulbactam (UNASYN) 3 g in sodium chloride 0.9 % 100 mL IVPB  Status:  Discontinued        3 g 200 mL/hr over 30 Minutes Intravenous Every 8 hours 05/09/20 0837 05/09/20 7628  05/09/20 0930  Ampicillin-Sulbactam (UNASYN) 3 g in sodium chloride 0.9 % 100 mL IVPB  Status:  Discontinued        3 g 200 mL/hr over 30 Minutes Intravenous Every 6 hours 05/09/20 0839 05/12/20 0817      PRN meds: acetaminophen **OR** acetaminophen (TYLENOL) oral liquid 160 mg/5 mL **OR** acetaminophen, hydrALAZINE, levalbuterol, sodium chloride flush   Objective: Vitals:   06/05/20 0401 06/05/20 0727  BP: 127/67 136/72  Pulse: (!) 102 (!) 110  Resp: 18 18  Temp: 98.1 F (36.7 C) 98 F (36.7 C)  SpO2: 100% 100%    Intake/Output Summary (Last 24 hours) at 06/05/2020 1052 Last data filed at 06/05/2020 0643 Gross per 24 hour  Intake --  Output 2150 ml  Net -2150 ml   Filed Weights   06/01/20 0314 06/02/20 0339 06/05/20 0500  Weight: 65.3 kg 65.2 kg 63.2 kg   Weight change:  Body mass index is 22.49 kg/m.   Physical Exam: General exam: Appears calm and comfortable.  Not in physical distress Skin: No rashes, lesions or ulcers. HEENT: Atraumatic, normocephalic, supple neck, no obvious bleeding Lungs: Clear to auscultation bilaterally CVS: Regular rate and rhythm, no murmur GI/Abd soft, continue, nondistended, bowel sound present.  PEG tube site intact. CNS: Alert, awake, able to produce some words but unable to give appropriate response Psychiatry: Mood  appropriate Extremities: No pedal edema, no calf tenderness  Data Review: I have personally reviewed the laboratory data and studies available.  Recent Labs  Lab 05/30/20 0217 05/31/20 0243 06/01/20 0519 06/02/20 0246 06/03/20 0136  WBC 13.9* 9.7 9.3 10.9* 10.8*  NEUTROABS 11.3* 6.9 6.5 8.2* 7.3  HGB 8.8* 8.5* 8.9* 8.4* 8.0*  HCT 29.3* 27.2* 29.4* 27.5* 26.8*  MCV 97.0 96.8 96.7 100.0 98.9  PLT 189 198 201 198 198   Recent Labs  Lab 05/30/20 0217 05/31/20 0243 06/01/20 0519 06/02/20 0246 06/03/20 0136 06/04/20 0203  NA 150* 143 140 138  --  134*  K 3.9 3.7 4.1 4.0  --  3.8  CL 123* 117* 112* 109  --  104  CO2 23 20* 20* 20*  --  23  GLUCOSE 143* 135* 110* 138*  --  142*  BUN 31* 23 17 19   --  17  CREATININE 1.04 0.89 0.74 0.80  --  0.78  CALCIUM 8.7* 8.5* 8.6* 8.5*  --  8.4*  MG 2.2 2.2 1.9 1.9 1.9  --   PHOS  --   --   --   --  3.3  --     F/u labs ordered  Signed, , MD [facility] 06/05/2020

## 2020-06-06 LAB — GLUCOSE, CAPILLARY
Glucose-Capillary: 143 mg/dL — ABNORMAL HIGH (ref 70–99)
Glucose-Capillary: 144 mg/dL — ABNORMAL HIGH (ref 70–99)
Glucose-Capillary: 147 mg/dL — ABNORMAL HIGH (ref 70–99)
Glucose-Capillary: 133 mg/dL — ABNORMAL HIGH (ref 70–99)
Glucose-Capillary: 101 mg/dL — ABNORMAL HIGH (ref 70–99)

## 2020-06-06 LAB — MAGNESIUM: Magnesium: 2 mg/dL (ref 1.7–2.4)

## 2020-06-06 LAB — CBC WITH DIFFERENTIAL/PLATELET
Abs Immature Granulocytes: 0.79 10*3/uL — ABNORMAL HIGH (ref 0.00–0.07)
Basophils Absolute: 0.1 10*3/uL (ref 0.0–0.1)
Basophils Relative: 1 %
Eosinophils Absolute: 0.2 10*3/uL (ref 0.0–0.5)
Eosinophils Relative: 2 %
HCT: 29.5 % — ABNORMAL LOW (ref 39.0–52.0)
Hemoglobin: 9.1 g/dL — ABNORMAL LOW (ref 13.0–17.0)
Immature Granulocytes: 8 %
Lymphocytes Relative: 10 %
Lymphs Abs: 1.1 10*3/uL (ref 0.7–4.0)
MCH: 30.7 pg (ref 26.0–34.0)
MCHC: 30.8 g/dL (ref 30.0–36.0)
MCV: 99.7 fL (ref 80.0–100.0)
Monocytes Absolute: 1.3 10*3/uL — ABNORMAL HIGH (ref 0.1–1.0)
Monocytes Relative: 12 %
Neutro Abs: 6.8 10*3/uL (ref 1.7–7.7)
Neutrophils Relative %: 67 %
Platelets: 283 10*3/uL (ref 150–400)
RBC: 2.96 MIL/uL — ABNORMAL LOW (ref 4.22–5.81)
RDW: 17.8 % — ABNORMAL HIGH (ref 11.5–15.5)
WBC: 10.2 10*3/uL (ref 4.0–10.5)
nRBC: 0.3 % — ABNORMAL HIGH (ref 0.0–0.2)

## 2020-06-06 LAB — BASIC METABOLIC PANEL
Anion gap: 7 (ref 5–15)
BUN: 16 mg/dL (ref 8–23)
CO2: 25 mmol/L (ref 22–32)
Calcium: 8.5 mg/dL — ABNORMAL LOW (ref 8.9–10.3)
Chloride: 101 mmol/L (ref 98–111)
Creatinine, Ser: 0.64 mg/dL (ref 0.61–1.24)
GFR, Estimated: >60 mL/min (ref >60)
Glucose, Bld: 163 mg/dL — ABNORMAL HIGH (ref 70–99)
Potassium: 4.2 mmol/L (ref 3.5–5.1)
Sodium: 133 mmol/L — ABNORMAL LOW (ref 135–145)

## 2020-06-06 LAB — PHOSPHORUS: Phosphorus: 3.6 mg/dL (ref 2.5–4.6)

## 2020-06-06 NOTE — Progress Notes (Signed)
PROGRESS NOTE  Alan Everett  DOB: October 04, 1955  PCP: Patient, No Pcp Per EXB:[id number]  DOA: 05/05/2020  LOS: 32 days   Chief Complaint  Patient presents with  . Code Stroke   Brief narrative: 64 y.o.malewith a PMHx of alcohol use and tobacco abuse. Patient presented to the ED via EMS on 10/6 as a Code Stroke for acute onset of right sided weakness, sensory loss and aphasia. CT head revealed an enlarging L thalamocapsular IPH w/ increasing IVH from L ventricle into 3rd and 4th ventricles. Neurosurgery was initially consulted for EVD consideration which was then deferred.  UDS positive for cocaine.   Admitted for stroke work-up. Hospital course complicated by pneumonia and recurrent blood loss anemia. Pending placement at this time.  Subjective: Patient was seen and examined this morning. Awake, unable to interact.  Room air.  Ongoing PEG tube feeding. Essentially no change in status in last several days.  Assessment/Plan: Acute Hemorrhagic stroke/cerebral edema -Likely in the setting of severe hypertension, alcohol and cocaine use. -Because of IVH, neurosurgery was consulted.  EVD was deferred.   -Subsequent CT scans of the head showed a stable findings.   -He is alert and awake but has global aphasia, unable to follow any command. -No change in neurological status in at least last 3 weeks. -Not on antiplatelets.  LDL 80.  Moderate to severe dysphagia -Secondary stroke.  PEG tube placed on 05/21/2020. -Aspiration precautions -Albumin level low at 1.8.  Bibasilar pneumonia -CT scan 10/12 showed bibasilar pneumonia.   -Treated with broad-spectrum antibiotics.   -WBC normalized.  No longer having fever.  Klebsiella UTI  -Urine culture report from 10/10 with more than 1000 CFU per mL of Klebsiella pneumoniae.  -Completed 5-day course of IV Rocephin.   Acute on chronic anemia -Total of 6 units of PRBC transfusions given in this admission. -Hemoglobin low but stable  at this time.   -GI consult appreciated.  Currently endoscopic evaluation is on hold. -Continue PPI twice daily. Recent Labs    05/27/20 0354 05/28/20 0242 05/29/20 0409 05/29/20 2113 05/30/20 0217 05/31/20 0243 06/01/20 0519 06/02/20 0246 06/03/20 0136 06/06/20 0433  HGB 7.6* 6.6* 7.1* 9.2* 8.8* 8.5* 8.9* 8.4* 8.0* 9.1*    Sinus tachycardia Essential hypertension -Heart rate and blood pressure both are stable on Coreg 3.125 mg daily.  Hypernatremia -Improved now.   Recent Labs  Lab 05/31/20 0243 06/01/20 0519 06/02/20 0246 06/04/20 0203 06/06/20 0433  NA 143 140 138 134* 133*   Elevated liver enzymes -Right upper quadrant ultrasound-cholelithiasis without acute cholecystitis -Chronic mild elevation of liver enzymes persist.  Tobacco Abuse -Counseled to quit using this  Cocaine abuse -Counseled to quit using this  GERD -PPI twice daily  Goals of care -Poor neurological recovery so far.  Poor expectation of further neurological recovery -Palliative care was consulted in the past.  Patient remains full code.    Mobility: PT eval obtained Code Status:   Code Status: Full Code  Nutritional status: Body mass index is 22.56 kg/m. Nutrition Problem: Severe Malnutrition Etiology: social / environmental circumstances Signs/Symptoms: severe fat depletion, severe muscle depletion Diet Order            DIET - DYS 1 Room service appropriate? No; Fluid consistency: Nectar Thick  Diet effective now                 DVT prophylaxis: SCD's Start: 05/05/20 1938   Antimicrobials:  None Fluid: None Consultants: Neurology Family Communication:  Not at bedside  Status is: Inpatient  Remains inpatient appropriate because:Unsafe d/c plan   Dispo: The patient is from: Home              Anticipated d/c is to: SNF              Anticipated d/c date is: Whenever bed is available              Patient currently is medically stable to d/c.   Infusions:     Scheduled Meds: . carvedilol  6.25 mg Per Tube BID WC  . chlorhexidine  15 mL Mouth Rinse BID  . feeding supplement (OSMOLITE 1.5 CAL)  780 mL Per Tube Q24H  . free water  200 mL Per Tube Q4H  . insulin aspart  0-9 Units Subcutaneous Q4H  . insulin glargine  15 Units Subcutaneous Daily  . mouth rinse  15 mL Mouth Rinse q12n4p  . pantoprazole sodium  40 mg Per Tube BID  . polyethylene glycol powder  0.5 Container Per Tube Once  . sennosides  5 mL Per Tube BID  . sodium chloride flush  10-40 mL Intracatheter Q12H    Antimicrobials: Anti-infectives (From admission, onward)   Start     Dose/Rate Route Frequency Ordered Stop   05/28/20 0900  cefTRIAXone (ROCEPHIN) 1 g in sodium chloride 0.9 % 100 mL IVPB        1 g 200 mL/hr over 30 Minutes Intravenous Every 24 hours 05/28/20 0807 06/01/20 0855   05/21/20 1515  ceFAZolin (ANCEF) IVPB 2g/100 mL premix        2 g 200 mL/hr over 30 Minutes Intravenous To Radiology 05/21/20 1429 05/21/20 2100   05/14/20 1000  cefTRIAXone (ROCEPHIN) 2 g in sodium chloride 0.9 % 100 mL IVPB        2 g 200 mL/hr over 30 Minutes Intravenous Daily 05/13/20 1454 05/15/20 1255   05/13/20 2200  metroNIDAZOLE (FLAGYL) tablet 500 mg        500 mg Per Tube Every 8 hours 05/13/20 1847 05/15/20 2219   05/13/20 1400  metroNIDAZOLE (FLAGYL) tablet 500 mg  Status:  Discontinued        500 mg Oral Every 8 hours 05/13/20 0935 05/13/20 1847   05/12/20 0900  cefTRIAXone (ROCEPHIN) 1 g in sodium chloride 0.9 % 100 mL IVPB  Status:  Discontinued        1 g 200 mL/hr over 30 Minutes Intravenous Daily 05/12/20 0817 05/13/20 1454   05/12/20 0900  azithromycin (ZITHROMAX) 500 mg in sodium chloride 0.9 % 250 mL IVPB  Status:  Discontinued        500 mg 250 mL/hr over 60 Minutes Intravenous Daily 05/12/20 0817 05/13/20 0935   05/09/20 0930  Ampicillin-Sulbactam (UNASYN) 3 g in sodium chloride 0.9 % 100 mL IVPB  Status:  Discontinued        3 g 200 mL/hr over 30 Minutes  Intravenous Every 8 hours 05/09/20 0837 05/09/20 0839   05/09/20 0930  Ampicillin-Sulbactam (UNASYN) 3 g in sodium chloride 0.9 % 100 mL IVPB  Status:  Discontinued        3 g 200 mL/hr over 30 Minutes Intravenous Every 6 hours 05/09/20 0839 05/12/20 0817      PRN meds: acetaminophen **OR** acetaminophen (TYLENOL) oral liquid 160 mg/5 mL **OR** acetaminophen, hydrALAZINE, levalbuterol, sodium chloride flush   Objective: Vitals:   06/06/20 0329 06/06/20 0814  BP: 128/70 (!) 147/79  Pulse: 97 99  Resp: 18 18  Temp: 97.7  F (36.5 C) 98.2 F (36.8 C)  SpO2: 100% 100%    Intake/Output Summary (Last 24 hours) at 06/06/2020 1136 Last data filed at 06/05/2020 2342 Gross per 24 hour  Intake 915 ml  Output 1601 ml  Net -686 ml   Filed Weights   06/02/20 0339 06/05/20 0500 06/06/20 0500  Weight: 65.2 kg 63.2 kg 63.4 kg   Weight change: 0.2 kg Body mass index is 22.56 kg/m.   Physical Exam: General exam: Appears calm and comfortable.  Not in physical distress Skin: No rashes, lesions or ulcers. HEENT: Atraumatic, normocephalic, supple neck, no obvious bleeding Lungs: Clear to auscultation bilaterally CVS: Regular rate and rhythm, no murmur GI/Abd soft, continue, nondistended, bowel sound present.  PEG tube site intact. CNS: Alert, awake, able to produce some words but unable to give appropriate response Psychiatry: Mood appropriate Extremities: No pedal edema, no calf tenderness  Data Review: I have personally reviewed the laboratory data and studies available.  Recent Labs  Lab 05/31/20 0243 06/01/20 0519 06/02/20 0246 06/03/20 0136 06/06/20 0433  WBC 9.7 9.3 10.9* 10.8* 10.2  NEUTROABS 6.9 6.5 8.2* 7.3 6.8  HGB 8.5* 8.9* 8.4* 8.0* 9.1*  HCT 27.2* 29.4* 27.5* 26.8* 29.5*  MCV 96.8 96.7 100.0 98.9 99.7  PLT 198 201 198 198 283   Recent Labs  Lab 05/31/20 0243 06/01/20 0519 06/02/20 0246 06/03/20 0136 06/04/20 0203 06/06/20 0433  NA 143 140 138  --  134* 133*   K 3.7 4.1 4.0  --  3.8 4.2  CL 117* 112* 109  --  104 101  CO2 20* 20* 20*  --  23 25  GLUCOSE 135* 110* 138*  --  142* 163*  BUN 23 17 19   --  17 16  CREATININE 0.89 0.74 0.80  --  0.78 0.64  CALCIUM 8.5* 8.6* 8.5*  --  8.4* 8.5*  MG 2.2 1.9 1.9 1.9  --  2.0  PHOS  --   --   --  3.3  --  3.6    F/u labs ordered  Signed, , MD [facility] 06/06/2020

## 2020-06-07 LAB — GLUCOSE, CAPILLARY
Glucose-Capillary: 115 mg/dL — ABNORMAL HIGH (ref 70–99)
Glucose-Capillary: 123 mg/dL — ABNORMAL HIGH (ref 70–99)
Glucose-Capillary: 137 mg/dL — ABNORMAL HIGH (ref 70–99)
Glucose-Capillary: 137 mg/dL — ABNORMAL HIGH (ref 70–99)
Glucose-Capillary: 143 mg/dL — ABNORMAL HIGH (ref 70–99)
Glucose-Capillary: 143 mg/dL — ABNORMAL HIGH (ref 70–99)
Glucose-Capillary: 96 mg/dL (ref 70–99)

## 2020-06-07 NOTE — Progress Notes (Signed)
PROGRESS NOTE  Alan Everett  DOB: Mar 19, 1956  PCP: Patient, No Pcp Per OFB:[id number]  DOA: 05/05/2020  LOS: 33 days   Chief Complaint  Patient presents with  . Code Stroke   Brief narrative: 64 y.o.malewith a PMHx of alcohol use and tobacco abuse. Patient presented to the ED via EMS on 10/6 as a Code Stroke for acute onset of right sided weakness, sensory loss and aphasia. CT head revealed an enlarging L thalamocapsular IPH w/ increasing IVH from L ventricle into 3rd and 4th ventricles. Neurosurgery was initially consulted for EVD consideration which was then deferred.  UDS positive for cocaine.   Admitted for stroke work-up. Hospital course complicated by pneumonia and recurrent blood loss anemia. Pending placement at this time.  Subjective: Patient was seen and examined this morning. Up in the recliner.  No change in mental status.  Rectal feeding ongoing.  Assessment/Plan: Acute Hemorrhagic stroke/cerebral edema -Likely in the setting of severe hypertension, alcohol and cocaine use. -Because of IVH, neurosurgery was consulted.  EVD was deferred.   -Subsequent CT scans of the head showed a stable findings.   -He is alert and awake but has global aphasia, unable to follow any command. -No change in neurological status in at least last 3 weeks. -Not on antiplatelets.  LDL 80.  Moderate to severe dysphagia -Secondary stroke.  PEG tube placed on 05/21/2020. -Aspiration precautions -Albumin level low at 1.8.  Bibasilar pneumonia -CT scan 10/12 showed bibasilar pneumonia.   -Treated with broad-spectrum antibiotics.   -WBC normalized.  No longer having fever.  Klebsiella UTI  -Urine culture report from 10/10 with more than 1000 CFU per mL of Klebsiella pneumoniae.   -Completed 5-day course of IV Rocephin.   Acute on chronic anemia -Total of 6 units of PRBC transfusions given in this admission. -Hemoglobin low but stable at this time.   -GI consult appreciated.   Currently endoscopic evaluation is on hold. -Continue PPI twice daily. Recent Labs    05/27/20 0354 05/28/20 0242 05/29/20 0409 05/29/20 2113 05/30/20 0217 05/31/20 0243 06/01/20 0519 06/02/20 0246 06/03/20 0136 06/06/20 0433  HGB 7.6* 6.6* 7.1* 9.2* 8.8* 8.5* 8.9* 8.4* 8.0* 9.1*   Sinus tachycardia Essential hypertension -Heart rate and blood pressure both are stable on Coreg 3.125 mg daily.  Hypernatremia -Improved now.   Recent Labs  Lab 06/01/20 0519 06/02/20 0246 06/04/20 0203 06/06/20 0433  NA 140 138 134* 133*   Elevated liver enzymes -Right upper quadrant ultrasound-cholelithiasis without acute cholecystitis -Chronic mild elevation of liver enzymes persist.  Tobacco Abuse -Counseled to quit using this  Cocaine abuse -Counseled to quit using this  GERD -PPI twice daily  Goals of care -Poor neurological recovery so far.  Poor expectation of further neurological recovery -Palliative care was consulted in the past.  Patient remains full code.    Mobility: PT eval obtained Code Status:   Code Status: Full Code  Nutritional status: Body mass index is 22.45 kg/m. Nutrition Problem: Severe Malnutrition Etiology: social / environmental circumstances Signs/Symptoms: severe fat depletion, severe muscle depletion Diet Order            DIET - DYS 1 Room service appropriate? No; Fluid consistency: Nectar Thick  Diet effective now                 DVT prophylaxis: SCD's Start: 05/05/20 1938   Antimicrobials:  None Fluid: None Consultants: Neurology Family Communication:  Not at bedside  Status is: Inpatient  Remains inpatient appropriate because:Unsafe d/c plan  Dispo: The patient is from: Home              Anticipated d/c is to: SNF              Anticipated d/c date is: Whenever bed is available              Patient currently is medically stable to d/c.   Infusions:    Scheduled Meds: . carvedilol  6.25 mg Per Tube BID WC  .  chlorhexidine  15 mL Mouth Rinse BID  . feeding supplement (OSMOLITE 1.5 CAL)  780 mL Per Tube Q24H  . free water  200 mL Per Tube Q4H  . insulin aspart  0-9 Units Subcutaneous Q4H  . insulin glargine  15 Units Subcutaneous Daily  . mouth rinse  15 mL Mouth Rinse q12n4p  . pantoprazole sodium  40 mg Per Tube BID  . polyethylene glycol powder  0.5 Container Per Tube Once  . sennosides  5 mL Per Tube BID  . sodium chloride flush  10-40 mL Intracatheter Q12H    Antimicrobials: Anti-infectives (From admission, onward)   Start     Dose/Rate Route Frequency Ordered Stop   05/28/20 0900  cefTRIAXone (ROCEPHIN) 1 g in sodium chloride 0.9 % 100 mL IVPB        1 g 200 mL/hr over 30 Minutes Intravenous Every 24 hours 05/28/20 0807 06/01/20 0855   05/21/20 1515  ceFAZolin (ANCEF) IVPB 2g/100 mL premix        2 g 200 mL/hr over 30 Minutes Intravenous To Radiology 05/21/20 1429 05/21/20 2100   05/14/20 1000  cefTRIAXone (ROCEPHIN) 2 g in sodium chloride 0.9 % 100 mL IVPB        2 g 200 mL/hr over 30 Minutes Intravenous Daily 05/13/20 1454 05/15/20 1255   05/13/20 2200  metroNIDAZOLE (FLAGYL) tablet 500 mg        500 mg Per Tube Every 8 hours 05/13/20 1847 05/15/20 2219   05/13/20 1400  metroNIDAZOLE (FLAGYL) tablet 500 mg  Status:  Discontinued        500 mg Oral Every 8 hours 05/13/20 0935 05/13/20 1847   05/12/20 0900  cefTRIAXone (ROCEPHIN) 1 g in sodium chloride 0.9 % 100 mL IVPB  Status:  Discontinued        1 g 200 mL/hr over 30 Minutes Intravenous Daily 05/12/20 0817 05/13/20 1454   05/12/20 0900  azithromycin (ZITHROMAX) 500 mg in sodium chloride 0.9 % 250 mL IVPB  Status:  Discontinued        500 mg 250 mL/hr over 60 Minutes Intravenous Daily 05/12/20 0817 05/13/20 0935   05/09/20 0930  Ampicillin-Sulbactam (UNASYN) 3 g in sodium chloride 0.9 % 100 mL IVPB  Status:  Discontinued        3 g 200 mL/hr over 30 Minutes Intravenous Every 8 hours 05/09/20 0837 05/09/20 0839   05/09/20 0930   Ampicillin-Sulbactam (UNASYN) 3 g in sodium chloride 0.9 % 100 mL IVPB  Status:  Discontinued        3 g 200 mL/hr over 30 Minutes Intravenous Every 6 hours 05/09/20 0839 05/12/20 0817      PRN meds: acetaminophen **OR** acetaminophen (TYLENOL) oral liquid 160 mg/5 mL **OR** acetaminophen, hydrALAZINE, levalbuterol, sodium chloride flush   Objective: Vitals:   06/07/20 0523 06/07/20 0835  BP: 123/75 122/71  Pulse: 99 93  Resp: 18 17  Temp: (!) 97.4 F (36.3 C) 98 F (36.7 C)  SpO2: 100% 100%  Intake/Output Summary (Last 24 hours) at 06/07/2020 1115 Last data filed at 06/07/2020 1022 Gross per 24 hour  Intake 3088.92 ml  Output 2000 ml  Net 1088.92 ml   Filed Weights   06/06/20 0500 06/07/20 0500 06/07/20 0658  Weight: 63.4 kg 62.8 kg 63.1 kg   Weight change: -0.6 kg Body mass index is 22.45 kg/m.   Physical Exam: General exam: Appears calm and comfortable.  Not in physical distress Skin: No rashes, lesions or ulcers. HEENT: Atraumatic, normocephalic, supple neck, no obvious bleeding Lungs: Clear to auscultation bilaterally CVS: Regular rate and rhythm, no murmur GI/Abd soft, continue, nondistended, bowel sound present.  PEG tube site intact. CNS: Alert, awake, able to produce some words but unable to give appropriate response Psychiatry: Mood appropriate Extremities: No pedal edema, no calf tenderness  Data Review: I have personally reviewed the laboratory data and studies available.  Recent Labs  Lab 06/01/20 0519 06/02/20 0246 06/03/20 0136 06/06/20 0433  WBC 9.3 10.9* 10.8* 10.2  NEUTROABS 6.5 8.2* 7.3 6.8  HGB 8.9* 8.4* 8.0* 9.1*  HCT 29.4* 27.5* 26.8* 29.5*  MCV 96.7 100.0 98.9 99.7  PLT 201 198 198 283   Recent Labs  Lab 06/01/20 0519 06/02/20 0246 06/03/20 0136 06/04/20 0203 06/06/20 0433  NA 140 138  --  134* 133*  K 4.1 4.0  --  3.8 4.2  CL 112* 109  --  104 101  CO2 20* 20*  --  23 25  GLUCOSE 110* 138*  --  142* 163*  BUN 17 19  --   17 16  CREATININE 0.74 0.80  --  0.78 0.64  CALCIUM 8.6* 8.5*  --  8.4* 8.5*  MG 1.9 1.9 1.9  --  2.0  PHOS  --   --  3.3  --  3.6    F/u labs ordered  Signed, Lorin Glass, MD [facility] 06/07/2020

## 2020-06-07 NOTE — Progress Notes (Signed)
  Speech Language Pathology Treatment: Cognitive-Linquistic;Dysphagia  Patient Details Name: Alan Everett MRN: [id number] DOB: 04-05-1956 Today's Date: 06/07/2020 Time: 1191-4782 SLP Time Calculation (min) (ACUTE ONLY): 23 min  Assessment / Plan / Recommendation Clinical Impression  Pt was seen for treatment and was cooperative during the session. Pt's meal tray was at bedside and pt was able to self-feed with assistance. He tolerated puree solids and nectar thick liquids without overt s/sx of aspiration. Mild oral residue was noted on the right but was cleared with liquid washes via straw. Pt's verbal output was improved compared to when he was last seen by this SLP. He produced multiple utterances, but intelligibility was reduced due to imprecision. Intelligible utterances included, "I want to I want to", "I can't sent it" and "How do you say it?". Pt produced "three" during an automatic sequence task. He demonstrated 40% accuracy with simple yes/no questions increasing to 60% with cues. However, perseveration was noted on "yes". He achieved 25% accuracy with confrontational naming when phonemic cues were given. SLP will continue to follow pt.    HPI HPI: Alan Everett is a 64 y.o. male with a PMHx of alcohol use and tobacco abuse who presents acutely to the ED via EMS as a Code Stroke for acute onset of right sided weakness. Patient with disoriented, right leg weakness, right limb ataxia, right decreased sensation and Global aphasia on exam. 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 Worsening of intraventricular extension.  MBS 10/19 mod-severe dysphagia, continue NPO. PEG 10/22.  Repeat MBS 10/30 with improvements. Started dys1/nectar 11/2.       SLP Plan  Continue with current plan of care       Recommendations  Diet recommendations: Dysphagia 1 (puree);Nectar-thick liquid Liquids provided via: Cup Medication Administration: Via alternative means Supervision: Staff to  assist with self feeding;Full supervision/cueing for compensatory strategies Compensations: Minimize environmental distractions;Monitor for anterior loss Postural Changes and/or Swallow Maneuvers: Seated upright 90 degrees                Oral Care Recommendations: Oral care BID Follow up Recommendations: Skilled Nursing facility SLP Visit Diagnosis: Dysphagia, oropharyngeal phase (R13.12);Aphasia (R47.01) Plan: Continue with current plan of care       Alan Everett I. Alan Clock, MS, CCC-SLP Acute Rehabilitation Services Office number [phone] Pager [phone]                Alan Everett 06/07/2020, 12:32 PM

## 2020-06-07 NOTE — Progress Notes (Signed)
Physical Therapy Treatment Patient Details Name: Alan Everett MRN: [id number] DOB: [birth year] Today's Date: 06/07/2020    History of Present Illness Patient is a 64 y/o male with PMH ETOH use, tobacco abuse, admitted with R side weakness,sensory loss and speech deficits.  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    PT Comments    Pt received in bed, sleeping but easy to wake. He required +2 max assist bed mobility, +2 max assist sit to stand, and +2 total assist squat pivot transfer toward left, bed to recliner. Pt in recliner with feet elevated at end of session. Little active participation noted from patient. No verbal interaction.    Follow Up Recommendations  SNF;Supervision/Assistance - 24 hour     Equipment Recommendations  Wheelchair cushion (measurements PT);Wheelchair (measurements PT);Hospital bed;Other (comment)    Recommendations for Other Services       Precautions / Restrictions Precautions Precautions: Fall;Other (comment) Precaution Comments: L handmitt, G tube, no functional use of RUE; R side neglect    Mobility  Bed Mobility Overal bed mobility: Needs Assistance Bed Mobility: Supine to Sit     Supine to sit: Max assist;+2 for physical assistance     General bed mobility comments: assist with all aspects of mobility  Transfers Overall transfer level: Needs assistance Equipment used: 2 person hand held assist Transfers: Sit to/from [employer] Sit to Stand: +2 physical assistance;Max assist   Squat pivot transfers: Total assist;+2 physical assistance;+2 safety/equipment     General transfer comment: pivot transfer bed to recliner toward left  Ambulation/Gait             General Gait Details: unable   Stairs             Wheelchair Mobility    Modified Rankin (Stroke Patients Only) Modified Rankin (Stroke Patients Only) Pre-Morbid Rankin Score: No  symptoms Modified Rankin: Severe disability     Balance Overall balance assessment: Needs assistance Sitting-balance support: Feet supported;Single extremity supported Sitting balance-Leahy Scale: Poor Sitting balance - Comments: reliant on external support Postural control: Right lateral lean   Standing balance-Leahy Scale: Zero                              Cognition Arousal/Alertness: Awake/alert Behavior During Therapy: Flat affect Overall Cognitive Status: Difficult to assess                                 General Comments: nonverbal during session      Exercises      General Comments General comments (skin integrity, edema, etc.): VSS on RA      Pertinent Vitals/Pain Pain Assessment: Faces Faces Pain Scale: No hurt    Home Living                      Prior Function            PT Goals (current goals can now be found in the care plan section) Acute Rehab PT Goals Patient Stated Goal: unable to state PT Goal Formulation: Patient unable to participate in goal setting Time For Goal Achievement: 06/17/20 Potential to Achieve Goals: Fair Progress towards PT goals: Not progressing toward goals - comment (little active participation from pt)    Frequency    Min 3X/week      PT  Plan Current plan remains appropriate    Co-evaluation              AM-PAC PT "6 Clicks" Mobility   Outcome Measure  Help needed turning from your back to your side while in a flat bed without using bedrails?: Total Help needed moving from lying on your back to sitting on the side of a flat bed without using bedrails?: Total Help needed moving to and from a bed to a chair (including a wheelchair)?: Total Help needed standing up from a chair using your arms (e.g., wheelchair or bedside chair)?: Total Help needed to walk in hospital room?: Total Help needed climbing 3-5 steps with a railing? : Total 6 Click Score: 6    End of Session  Equipment Utilized During Treatment: Gait belt Activity Tolerance: Patient tolerated treatment well Patient left: in chair;with call bell/phone within reach;with chair alarm set Nurse Communication: Mobility status PT Visit Diagnosis: Unsteadiness on feet (R26.81);Muscle weakness (generalized) (M62.81);Difficulty in walking, not elsewhere classified (R26.2);Other symptoms and signs involving the nervous system (R29.898);Hemiplegia and hemiparesis Hemiplegia - Right/Left: Right Hemiplegia - dominant/non-dominant: Dominant Hemiplegia - caused by: Nontraumatic intracerebral hemorrhage     Time: 0900-0916 PT Time Calculation (min) (ACUTE ONLY): 16 min  Charges:  $Therapeutic Activity: 8-22 mins                     Aida Raider, PT  Office # [phone] Pager [phone]    Ilda Foil 06/07/2020, 11:43 AM

## 2020-06-08 LAB — GLUCOSE, CAPILLARY
Glucose-Capillary: 122 mg/dL — ABNORMAL HIGH (ref 70–99)
Glucose-Capillary: 128 mg/dL — ABNORMAL HIGH (ref 70–99)
Glucose-Capillary: 135 mg/dL — ABNORMAL HIGH (ref 70–99)
Glucose-Capillary: 136 mg/dL — ABNORMAL HIGH (ref 70–99)
Glucose-Capillary: 83 mg/dL (ref 70–99)
Glucose-Capillary: 89 mg/dL (ref 70–99)

## 2020-06-08 NOTE — Plan of Care (Signed)
  Problem: Education: Goal: Knowledge of General Education information will improve Description: Including pain rating scale, medication(s)/side effects and non-pharmacologic comfort measures Outcome: Progressing   Problem: Health Behavior/Discharge Planning: Goal: Ability to manage health-related needs will improve Outcome: Progressing   Problem: Clinical Measurements: Goal: Ability to maintain clinical measurements within normal limits will improve Outcome: Progressing Goal: Will remain free from infection Outcome: Progressing Goal: Diagnostic test results will improve Outcome: Progressing Goal: Respiratory complications will improve Outcome: Progressing Goal: Cardiovascular complication will be avoided Outcome: Progressing   Problem: Activity: Goal: Risk for activity intolerance will decrease Outcome: Progressing   Problem: Nutrition: Goal: Adequate nutrition will be maintained Outcome: Progressing   Problem: Coping: Goal: Level of anxiety will decrease Outcome: Progressing   Problem: Elimination: Goal: Will not experience complications related to bowel motility Outcome: Progressing Goal: Will not experience complications related to urinary retention Outcome: Progressing   Problem: Pain Managment: Goal: General experience of comfort will improve Outcome: Progressing   Problem: Safety: Goal: Ability to remain free from injury will improve Outcome: Progressing   Problem: Skin Integrity: Goal: Risk for impaired skin integrity will decrease Outcome: Progressing   Problem: Education: Goal: Knowledge of disease or condition will improve Outcome: Progressing Goal: Knowledge of secondary prevention will improve Outcome: Progressing Goal: Knowledge of patient specific risk factors addressed and post discharge goals established will improve Outcome: Progressing Goal: Individualized Educational Video(s) Outcome: Progressing   Problem: Coping: Goal: Will verbalize  positive feelings about self Outcome: Progressing Goal: Will identify appropriate support needs Outcome: Progressing   Problem: Health Behavior/Discharge Planning: Goal: Ability to manage health-related needs will improve Outcome: Progressing   Problem: Self-Care: Goal: Ability to participate in self-care as condition permits will improve Outcome: Progressing Goal: Verbalization of feelings and concerns over difficulty with self-care will improve Outcome: Progressing Goal: Ability to communicate needs accurately will improve Outcome: Progressing   Problem: Nutrition: Goal: Risk of aspiration will decrease Outcome: Progressing Goal: Dietary intake will improve Outcome: Progressing   Problem: Nutrition: Goal: Risk of aspiration will decrease Outcome: Progressing Goal: Dietary intake will improve Outcome: Progressing   Problem: Intracerebral Hemorrhage Tissue Perfusion: Goal: Complications of Intracerebral Hemorrhage will be minimized Outcome: Progressing

## 2020-06-08 NOTE — Progress Notes (Signed)
PROGRESS NOTE  Alan Everett  DOB: [birth year]  PCP: Patient, No Pcp Per [facility]:[id number]  DOA: 05/05/2020  LOS: 34 days   Chief Complaint  Patient presents with  . Code Stroke   Brief narrative: 64 y.o.malewith a PMHx of alcohol use and tobacco abuse. Patient presented to the ED via EMS on 10/6 as a Code Stroke for acute onset of right sided weakness, sensory loss and aphasia. CT head revealed an enlarging L thalamocapsular IPH w/ increasing IVH from L ventricle into 3rd and 4th ventricles. Neurosurgery was initially consulted for EVD consideration which was then deferred.  UDS positive for cocaine.   Admitted for stroke work-up. Hospital course complicated by pneumonia and recurrent blood loss anemia. Pending placement at this time.  Subjective: Patient was seen and examined this morning. Propped up in bed.  Awake.  Cannot state his name.  Unable to follow other commands.  Assessment/Plan: Acute Hemorrhagic stroke/cerebral edema -Likely in the setting of severe hypertension, alcohol and cocaine use. -Because of IVH, neurosurgery was consulted. EVD was deferred.   -Subsequent CT scans of the head showed stable findings.   -He is alert and awake but has global aphasia, unable to follow commands. -No change in neurological status in at least last 3 weeks. -Not on antiplatelets.  LDL 80.  Moderate to severe dysphagia -Secondary stroke.  PEG tube placed on 05/21/2020. -Aspiration precautions -Albumin level low at 1.8.  Bibasilar pneumonia -CT scan 10/12 showed bibasilar pneumonia.   -Treated with broad-spectrum antibiotics.   -WBC normalized.  No longer having fever.  Klebsiella UTI  -Urine culture report from 10/10 with more than 1000 CFU per mL of Klebsiella pneumoniae.   -Completed 5-day course of IV Rocephin.   Acute on chronic anemia -Total of 6 units of PRBC transfusions given in this admission. -Hemoglobin low but stable at this time.   -GI consult  appreciated.  Currently endoscopic evaluation is on hold. -Continue PPI twice daily. Recent Labs    05/27/20 0354 05/28/20 0242 05/29/20 0409 05/29/20 2113 05/30/20 0217 05/31/20 0243 06/01/20 0519 06/02/20 0246 06/03/20 0136 06/06/20 0433  HGB 7.6* 6.6* 7.1* 9.2* 8.8* 8.5* 8.9* 8.4* 8.0* 9.1*   Sinus tachycardia Essential hypertension -Heart rate and blood pressure both are stable on Coreg 3.125 mg daily.  Hypernatremia -Improved now.   Recent Labs  Lab 06/02/20 0246 06/04/20 0203 06/06/20 0433  NA 138 134* 133*   Elevated liver enzymes -Right upper quadrant ultrasound-cholelithiasis without acute cholecystitis -Chronic mild elevation of liver enzymes persist.  Tobacco Abuse -Counseled to quit using this  Cocaine abuse -Counseled to quit using this  GERD -PPI twice daily  Goals of care -Poor neurological recovery so far.  Poor expectation of further neurological recovery -Palliative care was consulted in the past.  Patient remains full code.    Mobility: PT eval obtained Code Status:   Code Status: Full Code  Nutritional status: Body mass index is 21.78 kg/m. Nutrition Problem: Severe Malnutrition Etiology: social / environmental circumstances Signs/Symptoms: severe fat depletion, severe muscle depletion Diet Order            DIET - DYS 1 Room service appropriate? No; Fluid consistency: Nectar Thick  Diet effective now                 DVT prophylaxis: SCD's Start: 05/05/20 1938   Antimicrobials:  None Fluid: None Consultants: Neurology Family Communication:  Not at bedside  Status is: Inpatient  Remains inpatient appropriate because:Unsafe d/c plan   Dispo:  The patient is from: Home              Anticipated d/c is to: SNF              Anticipated d/c date is: Whenever bed is available              Patient currently is medically stable to d/c.   Infusions:    Scheduled Meds: . carvedilol  6.25 mg Per Tube BID WC  . chlorhexidine   15 mL Mouth Rinse BID  . feeding supplement (OSMOLITE 1.5 CAL)  780 mL Per Tube Q24H  . free water  200 mL Per Tube Q4H  . insulin aspart  0-9 Units Subcutaneous Q4H  . insulin glargine  15 Units Subcutaneous Daily  . mouth rinse  15 mL Mouth Rinse q12n4p  . pantoprazole sodium  40 mg Per Tube BID  . polyethylene glycol powder  0.5 Container Per Tube Once  . sennosides  5 mL Per Tube BID  . sodium chloride flush  10-40 mL Intracatheter Q12H    Antimicrobials: Anti-infectives (From admission, onward)   Start     Dose/Rate Route Frequency Ordered Stop   05/28/20 0900  cefTRIAXone (ROCEPHIN) 1 g in sodium chloride 0.9 % 100 mL IVPB        1 g 200 mL/hr over 30 Minutes Intravenous Every 24 hours 05/28/20 0807 06/01/20 0855   05/21/20 1515  ceFAZolin (ANCEF) IVPB 2g/100 mL premix        2 g 200 mL/hr over 30 Minutes Intravenous To Radiology 05/21/20 1429 05/21/20 2100   05/14/20 1000  cefTRIAXone (ROCEPHIN) 2 g in sodium chloride 0.9 % 100 mL IVPB        2 g 200 mL/hr over 30 Minutes Intravenous Daily 05/13/20 1454 05/15/20 1255   05/13/20 2200  metroNIDAZOLE (FLAGYL) tablet 500 mg        500 mg Per Tube Every 8 hours 05/13/20 1847 05/15/20 2219   05/13/20 1400  metroNIDAZOLE (FLAGYL) tablet 500 mg  Status:  Discontinued        500 mg Oral Every 8 hours 05/13/20 0935 05/13/20 1847   05/12/20 0900  cefTRIAXone (ROCEPHIN) 1 g in sodium chloride 0.9 % 100 mL IVPB  Status:  Discontinued        1 g 200 mL/hr over 30 Minutes Intravenous Daily 05/12/20 0817 05/13/20 1454   05/12/20 0900  azithromycin (ZITHROMAX) 500 mg in sodium chloride 0.9 % 250 mL IVPB  Status:  Discontinued        500 mg 250 mL/hr over 60 Minutes Intravenous Daily 05/12/20 0817 05/13/20 0935   05/09/20 0930  Ampicillin-Sulbactam (UNASYN) 3 g in sodium chloride 0.9 % 100 mL IVPB  Status:  Discontinued        3 g 200 mL/hr over 30 Minutes Intravenous Every 8 hours 05/09/20 0837 05/09/20 0839   05/09/20 0930   Ampicillin-Sulbactam (UNASYN) 3 g in sodium chloride 0.9 % 100 mL IVPB  Status:  Discontinued        3 g 200 mL/hr over 30 Minutes Intravenous Every 6 hours 05/09/20 0839 05/12/20 0817      PRN meds: acetaminophen **OR** acetaminophen (TYLENOL) oral liquid 160 mg/5 mL **OR** acetaminophen, hydrALAZINE, levalbuterol, sodium chloride flush   Objective: Vitals:   06/08/20 1129 06/08/20 1617  BP: 114/74 133/63  Pulse: 93 (!) 103  Resp: 17 18  Temp: 98 F (36.7 C) 98.6 F (37 C)  SpO2: 100% 100%  Intake/Output Summary (Last 24 hours) at 06/08/2020 1702 Last data filed at 06/08/2020 1550 Gross per 24 hour  Intake 12570.31 ml  Output 2300 ml  Net 10270.31 ml   Filed Weights   06/07/20 0500 06/07/20 0658 06/08/20 0409  Weight: 62.8 kg 63.1 kg 61.2 kg   Weight change: -1.6 kg Body mass index is 21.78 kg/m.   Physical Exam: General exam: Appears calm and comfortable.  Not in physical distress.  Skin: No rashes, lesions or ulcers. HEENT: Atraumatic, normocephalic, supple neck, no obvious bleeding Lungs: Clear to auscultation bilaterally CVS: Regular rate and rhythm, no murmur GI/Abd soft, continue, nondistended, bowel sound present.  PEG tube site intact. CNS: Alert, awake, able to produce some words but unable to give appropriate response Psychiatry: Mood appropriate Extremities: No pedal edema, no calf tenderness  Data Review: I have personally reviewed the laboratory data and studies available.  Recent Labs  Lab 06/02/20 0246 06/03/20 0136 06/06/20 0433  WBC 10.9* 10.8* 10.2  NEUTROABS 8.2* 7.3 6.8  HGB 8.4* 8.0* 9.1*  HCT 27.5* 26.8* 29.5*  MCV 100.0 98.9 99.7  PLT 198 198 283   Recent Labs  Lab 06/02/20 0246 06/03/20 0136 06/04/20 0203 06/06/20 0433  NA 138  --  134* 133*  K 4.0  --  3.8 4.2  CL 109  --  104 101  CO2 20*  --  23 25  GLUCOSE 138*  --  142* 163*  BUN 19  --  17 16  CREATININE 0.80  --  0.78 0.64  CALCIUM 8.5*  --  8.4* 8.5*  MG 1.9  1.9  --  2.0  PHOS  --  3.3  --  3.6    F/u labs ordered  Signed, Lorin Glass, MD [facility] 06/08/2020

## 2020-06-08 NOTE — Progress Notes (Signed)
Occupational Therapy Treatment Patient Details Name: Alan Everett MRN: [id number] DOB: Jun 25, 1956 Today's Date: 06/08/2020    History of present illness Patient is a 64 y/o male with PMH ETOH use, tobacco abuse, admitted with R side weakness,sensory loss and speech deficits.  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   OT comments  Pt making steady progress towards OT goals this session. Pt continues to present with R sided neglect and R sided weakness, decreased activity tolerance and impaired balance. Session focus on functional mobility as precursor to higher level BADLs. Pt completed x4 sit<>stands to stedy with MOD- MAX A +2. Once pt standing, worked on lateral weight shifting R<>L as precursor to functional gait training. Pt required MOD A +2 to maintain upright posture during task. Pt with heavy R sided neglect only attending to therapist on pts R side 2x with max multimodal cues. Pt mostly nonverbal during session but did verbalize garbled speech to pts girlfriend. Pt would continue to benefit from skilled occupational therapy while admitted and after d/c to address the below listed limitations in order to improve overall functional mobility and facilitate independence with BADL participation. DC plan remains appropriate, will follow acutely per POC.     Follow Up Recommendations  SNF    Equipment Recommendations  Wheelchair (measurements OT);Wheelchair cushion (measurements OT);Hospital bed    Recommendations for Other Services      Precautions / Restrictions Precautions Precautions: Fall;Other (comment) Precaution Comments: L handmitt, G tube, no functional use of RUE; R side neglect       Mobility Bed Mobility Overal bed mobility: Needs Assistance Bed Mobility: Supine to Sit;Sit to Supine     Supine to sit: Max assist;+2 for physical assistance Sit to supine: +2 for physical assistance;Max assist   General  bed mobility comments: pt required MAX A +2 for all bed mobility. pt unable to maneuver RLE to EOB without assist but does reach out with LUE to ask for assist to elevate trunk into sitting.  Transfers Overall transfer level: Needs assistance Equipment used: Ambulation equipment used Transfers: Sit to/from Stand Sit to Stand: +2 physical assistance;Max assist;Mod assist         General transfer comment: pt completed x4 sit<>stands from stedy with up to MAX A with one trial of MODA +2. pt requires assist to position RUE on stedy and manual facilitation at pts hips to bring hips into full extenion and elevate trunk into full stand. worked on [employer] shifting R<>L with MOD A +2    Balance Overall balance assessment: Needs assistance Sitting-balance support: Feet supported;Single extremity supported Sitting balance-Leahy Scale: Poor Sitting balance - Comments: reliant on external support, pt requried at least [facility]  for sitting balance needing up to MAX A at times Postural control: Right lateral lean Standing balance support: Bilateral upper extremity supported Standing balance-Leahy Scale: Poor Standing balance comment: max-totalA for static standing balance                           ADL either performed or assessed with clinical judgement   ADL Overall ADL's : Needs assistance/impaired Eating/Feeding: NPO   Grooming: Wash/dry face;Sitting;Supervision/safety;Set up;Minimal assistance Grooming Details (indicate cue type and reason): pt able to wash face from EOB but required at least MIN A for sitting balance EOB                 Toilet Transfer: Maximal assistance;+2 for physical  assistance;Moderate assistance Toilet Transfer Details (indicate cue type and reason): deferred OOB transfer d/t safety however pt was able to stand to stedy x4 with MAX- MOD A +2         Functional mobility during ADLs: Moderate assistance;Maximal assistance;+2 for physical assistance  (sit<>stand only) General ADL Comments: pt presents with impaired balance, R sided neglect and R sided weakness     Vision   Additional Comments: continue to assess vision as pt minimally attempting to conversate this session but noted heavy R sided neglect. would occassionally track to R with max tactile cues   Perception     Praxis      Cognition Arousal/Alertness: Awake/alert Behavior During Therapy: Flat affect Overall Cognitive Status: Difficult to assess                                 General Comments: nonverbal during session, would occassionally attempt to verbalize to gf "Johnice Riebe"        Exercises     Shoulder Instructions       General Comments pts gf " [name]" present during session , pt did attempt to talk to [facility] during session. Education provided to [facility] about sitting on pts R side d/t pts R sided neglect. additional education on PROM to pts RUE, [name] reports she is a Lawyer and has been doing ROM to pts RLE    Pertinent Vitals/ Pain       Pain Assessment: Faces Faces Pain Scale: Hurts a little bit Pain Location: general discomfort with mobility Pain Descriptors / Indicators: Discomfort;Grimacing Pain Intervention(s): Monitored during session;Repositioned  Home Living                                          Prior Functioning/Environment              Frequency  Min 2X/week        Progress Toward Goals  OT Goals(current goals can now be found in the care plan section)  Progress towards OT goals: Progressing toward goals  Acute Rehab OT Goals Patient Stated Goal: unable to state OT Goal Formulation: Patient unable to participate in goal setting Time For Goal Achievement: 06/09/20 Potential to Achieve Goals: Fair  Plan Discharge plan remains appropriate;Frequency remains appropriate    Co-evaluation                 AM-PAC OT "6 Clicks" Daily Activity     Outcome Measure   Help from another person eating  meals?: Total Help from another person taking care of personal grooming?: A Lot Help from another person toileting, which includes using toliet, bedpan, or urinal?: Total Help from another person bathing (including washing, rinsing, drying)?: Total Help from another person to put on and taking off regular upper body clothing?: Total Help from another person to put on and taking off regular lower body clothing?: Total 6 Click Score: 7    End of Session Equipment Utilized During Treatment: Gait belt;Other (comment) (stedy)  OT Visit Diagnosis: Other abnormalities of gait and mobility (R26.89);Muscle weakness (generalized) (M62.81);Low vision, both eyes (H54.2);Other symptoms and signs involving cognitive function;Hemiplegia and hemiparesis Hemiplegia - Right/Left: Right Hemiplegia - dominant/non-dominant: Dominant   Activity Tolerance Patient tolerated treatment well   Patient Left in bed;with call bell/phone within reach;with bed alarm set;with family/visitor present  Nurse Communication Mobility status        Time: [id number] OT Time Calculation (min): 23 min  Charges: OT General Charges $OT Visit: 1 Visit OT Treatments $Self Care/Home Management : 8-22 mins $Therapeutic Activity: 8-22 mins  Audery Amel., COTA/L Acute Rehabilitation Services [phone]    Angelina Pih 06/08/2020, 3:06 PM

## 2020-06-09 LAB — GLUCOSE, CAPILLARY
Glucose-Capillary: 132 mg/dL — ABNORMAL HIGH (ref 70–99)
Glucose-Capillary: 123 mg/dL — ABNORMAL HIGH (ref 70–99)
Glucose-Capillary: 144 mg/dL — ABNORMAL HIGH (ref 70–99)
Glucose-Capillary: 83 mg/dL (ref 70–99)
Glucose-Capillary: 122 mg/dL — ABNORMAL HIGH (ref 70–99)
Glucose-Capillary: 124 mg/dL — ABNORMAL HIGH (ref 70–99)

## 2020-06-09 LAB — CBC WITH DIFFERENTIAL/PLATELET
Abs Immature Granulocytes: 0.29 10*3/uL — ABNORMAL HIGH (ref 0.00–0.07)
Basophils Absolute: 0.1 10*3/uL (ref 0.0–0.1)
Basophils Relative: 1 %
Eosinophils Absolute: 0.2 10*3/uL (ref 0.0–0.5)
Eosinophils Relative: 2 %
HCT: 26.5 % — ABNORMAL LOW (ref 39.0–52.0)
Hemoglobin: 8.1 g/dL — ABNORMAL LOW (ref 13.0–17.0)
Immature Granulocytes: 3 %
Lymphocytes Relative: 14 %
Lymphs Abs: 1.3 10*3/uL (ref 0.7–4.0)
MCH: 30.6 pg (ref 26.0–34.0)
MCHC: 30.6 g/dL (ref 30.0–36.0)
MCV: 100 fL (ref 80.0–100.0)
Monocytes Absolute: 1.3 10*3/uL — ABNORMAL HIGH (ref 0.1–1.0)
Monocytes Relative: 15 %
Neutro Abs: 5.7 10*3/uL (ref 1.7–7.7)
Neutrophils Relative %: 65 %
Platelets: 388 10*3/uL (ref 150–400)
RBC: 2.65 MIL/uL — ABNORMAL LOW (ref 4.22–5.81)
RDW: 17.7 % — ABNORMAL HIGH (ref 11.5–15.5)
WBC: 8.8 10*3/uL (ref 4.0–10.5)
nRBC: 0 % (ref 0.0–0.2)

## 2020-06-09 LAB — BASIC METABOLIC PANEL
Anion gap: 6 (ref 5–15)
BUN: 14 mg/dL (ref 8–23)
CO2: 25 mmol/L (ref 22–32)
Calcium: 8.2 mg/dL — ABNORMAL LOW (ref 8.9–10.3)
Chloride: 102 mmol/L (ref 98–111)
Creatinine, Ser: 0.61 mg/dL (ref 0.61–1.24)
GFR, Estimated: >60 mL/min (ref >60)
Glucose, Bld: 141 mg/dL — ABNORMAL HIGH (ref 70–99)
Potassium: 3.6 mmol/L (ref 3.5–5.1)
Sodium: 133 mmol/L — ABNORMAL LOW (ref 135–145)

## 2020-06-09 LAB — MAGNESIUM: Magnesium: 1.7 mg/dL (ref 1.7–2.4)

## 2020-06-09 LAB — PHOSPHORUS: Phosphorus: 3.1 mg/dL (ref 2.5–4.6)

## 2020-06-09 NOTE — Progress Notes (Signed)
Nutrition Follow-up  DOCUMENTATION CODES:   Severe malnutrition in context of social or environmental circumstances  INTERVENTION:  Continue: -Vital Cuisine shake po TID, each supplement provides 500 kcal and 22 grams of protien  -Osmolite 1.5 @ 69ml/hr via PEG for 12 hours from 1800-0600 with free water flushes per MD (currently Q4H)  Nocturnal TF regimen will provide 1170 kcals (meets 65% minimum calorie needs), 48 grams protein, free water   NUTRITION DIAGNOSIS:   Severe Malnutrition related to social / environmental circumstances as evidenced by severe fat depletion, severe muscle depletion.  ongoing  GOAL:   Patient will meet greater than or equal to 90% of their needs  Addressing with supplements and TF  MONITOR:   TF tolerance, Diet advancement, Weight trends, Skin  REASON FOR ASSESSMENT:   Consult, New TF Enteral/tube feeding initiation and management  ASSESSMENT:   64 yo male presents with acute onset of right sided weakness and mixed recept and expressive aphasia and admitted with acute left thalamocapsular ICH, severe HTN. PMH includes EtOH and tobacco abuse  10/06 Admitted 10/08 Cortrak placed(gastric) 10/13 repeat CT head showed stable L thalamic hemorrhageand intraventricular extension since 05/08/2020. Regional edema, mild regional mass-effect and mild lateral ventriculomegaly and trace SAH. 10/19 failed MBS 10/22 s/p PEG  Pt pending placement   Pt is awake but unable to interact appropriately. Pt continues on Dysphagia 1 diet with Nectar thick liquids, but his po intake has not been sufficient to meet his nutritional needs. Since last RD assessment, pt has consumed 0-50% of the last 8 recorded meals (21% average meal intake). Pt has been receiving Vital Cuisine shakes po TID, but his consumption of these is unclear. Today's RN reports pt drank ~50% of the one he received this morning, but could not provide any other details. Given pt  continues to meet less than 75% of his estimated needs po, recommend continuing nocturnal TF to help pt meet his needs while stimulating his appetite during the day. Pt currently receiving Osmolite 1.5 @ 17ml/hr for 12 hours from 1800-0600 with free water Q4H (per MD). This provides 1170 kcals (meets 65% minimum calorie needs), 48 grams protein, free water. Recommend continuing with current nutrition plan of care and monitoring for adequacy of supplement acceptance/po intake. Discussed encouraging pt to drink supplements with RN.   UOP: x24 hours  Labs: Na 133 (L), CBGs [phone] Medications: ss novolog, 15 units lantus daily, protonix, miralax, senokot  Admit wt: 65.7 kg Current wt: 61.9 kg  Diet Order:   Diet Order            DIET - DYS 1 Room service appropriate? No; Fluid consistency: Nectar Thick  Diet effective now                 EDUCATION NEEDS:   Not appropriate for education at this time  Skin:  Skin Assessment: Skin Integrity Issues: Skin Integrity Issues:: Stage II, Other (Comment) Stage II: R buttocks Other: puncture abdomen  Last BM:  11/8 type 6  Height:   Ht Readings from Last 1 Encounters:  04/09/20 5\' 6"  (1.676 m)    Weight:   Wt Readings from Last 1 Encounters:  06/09/20 61.9 kg    BMI:  Body mass index is 22.02 kg/m.  Estimated Nutritional Needs:   Kcal:  1780-1980 kcals  Protein:  85-95 g  Fluid:  >/= 1.8 L    13/10/21, MS, RD, LDN RD pager number and weekend/on-call pager  number located in [place].

## 2020-06-09 NOTE — Hospital Course (Addendum)
64 year old man PMH alcohol abuse presented 10/6 as code stroke for acute onset right-sided weakness, aphasia.  Found to have intraparenchymal hemorrhage and intraventricular hemorrhage.  Neurosurgery deferred EVD.  Admitted to neuro ICU.  Hospitalization was prolonged by recurrent anemia requiring multiple transfusions.  Seen by gastroenterology but treated conservatively.  Hemoglobin stabilized.  Condition failed to improve, PEG tube placed.  Continues with therapy.  Unsafe discharge, needs SNF.  Elwyn Lade is lack of insurance.  Remains medically stable for discharge.  A & P  Intracranial hemorrhage, intraventricular hemorrhage with hydrocephalus, cerebral edema, secondary to severe hypertension in the setting of alcohol and cocaine use.  Deficits aphasia, dysphagia, right hemiplegia.   --Neurology signed off May 14, 2020. --PEG tube.  Continue Lantus for hyperglycemia secondary to tube feeds.  Continue dysphagia diet. --Neurologic status has been stable for many weeks.  Continue to pursue SNF.  Essential hypertension --Remains stable.  SBP goal less than 160  Hyperlipidemia --No statin per stroke service given mild transaminitis   Hyperglycemia secondary to tube feeds --Stable.  Continue Lantus  Iron deficiency anemia acute on chronic, multifactorial anemia thought acute illness on chronic disease.  Required multiple transfusions PRBC.  Seen by gastroenterology with conservative management recommended given acute issues. --Hgb stable on last check.  No further evaluation suggested at this time.  Hepatitis C with associated transaminitis --consider outpatient referral to infectious disease for consideration of treatment  Indeterminate hepatic lesions.  --consider further characterization with MRI without and with contrast recommended as an outpatient versus inpatient given length of stay  Aortic atherosclerosis --No treatment indicated  RESOLVED Hypertensive emergency treated with  Cleviprex Aspiration pneumonia UTI Thrombocytopenia Cocaine abuse

## 2020-06-09 NOTE — Progress Notes (Signed)
PROGRESS NOTE    Alan Everett   GYF:[id number]  DOB: [birth year]  DOA: 05/05/2020     35  PCP: Patient, No Pcp Per  CC: weakness  Hospital Course: 64 y.o. male with a PMHx of alcohol use and tobacco abuse. Patient presented to the ED via EMS on 10/6 as a Code Stroke for acute onset of right sided weakness, sensory loss and aphasia. CT head revealed an enlarging L thalamocapsular IPH w/ increasing IVH from L ventricle into 3rd and 4th ventricles. Neurosurgery was initially consulted for EVD consideration which was then deferred.   UDS positive for cocaine.    Admitted for stroke work-up. Hospital course complicated by pneumonia and recurrent blood loss anemia. Pending placement at this time.   Interval History:  No reported events overnight.  Patient lying in bed unable to communicate or interact appropriately.  Says yes to most any question asked.  Cannot understand or follow any commands.  Old records reviewed in assessment of this patient  ROS: Review of systems not obtained due to patient factors. Severe cognitive impairment   Assessment & Plan: Acute Hemorrhagic stroke/cerebral edema -Likely in the setting of severe hypertension, alcohol and cocaine use. -Because of IVH, neurosurgery was consulted. EVD was deferred.   -Subsequent CT scans of the head showed stable findings.   -He is alert and awake but has global aphasia, unable to follow commands. -No change in neurological status in at least last 3 weeks. -Not on antiplatelets.  LDL 80.  Moderate to severe dysphagia -Secondary to stroke.  PEG tube placed on 05/21/2020. -Aspiration precautions -Albumin level low at 1.8.  Bibasilar pneumonia -CT scan 10/12 showed bibasilar pneumonia.   -Treated with broad-spectrum antibiotics.   -WBC normalized.  No longer having fever.  Klebsiella UTI  -Urine culture report from 10/10 with more than 1000 CFU per mL of Klebsiella pneumoniae.   -Completed 5-day course of IV  Rocephin.   Acute on chronic anemia -Total of 6 units of PRBC transfusions given in this admission. -Hemoglobin stable 8-9 g/dL -GI consult appreciated.  Currently endoscopic evaluation is on hold. -Continue PPI twice daily.  Sinus tachycardia Essential hypertension -Heart rate and blood pressure both are stable on Coreg 3.125 mg daily.  Hypernatremia - resolved   Elevated liver enzymes -Right upper quadrant ultrasound-cholelithiasis without acute cholecystitis -Chronic mild elevation of liver enzymes persist.  Tobacco Abuse -Counseled to quit using this  Cocaine abuse -Counseled to quit using this  GERD -PPI twice daily  Goals of care -Poor neurological recovery so far.  Poor expectation of further neurological recovery -Palliative care was consulted in the past.  Patient remains full code.    Antimicrobials: None further   DVT prophylaxis: SCD Code Status: Full Family Communication: none present Disposition Plan: Status is: Inpatient  Remains inpatient appropriate because:Unsafe d/c plan and Inpatient level of care appropriate due to severity of illness   Dispo: The patient is from: Home              Anticipated d/c is to: SNF              Anticipated d/c date is: > 3 days. Placement difficult due to lack of insurance and cocaine positive on admission               Patient currently is not medically stable to d/c.   Objective: Blood pressure 128/62, pulse 90, temperature 98.4 F (36.9 C), temperature source Oral, resp. rate 20, weight 61.9 kg, SpO2 100 %.  Examination: General appearance: resting in bed comfortable; not able to follow commands or interact Head: Normocephalic, without obvious abnormality, atraumatic Eyes: EOMI Lungs: clear to auscultation bilaterally Heart: regular rate and rhythm and S1, S2 normal Abdomen: soft, NT, ND, BS present; PEG in palce Extremities: no edema Skin: xerosis Neurologic: no movement with right side; moves LUE  spontaneously but cannot follow any commands  Consultants:   Neuro  GI  Procedures:   none  Data Reviewed: I have personally reviewed following labs and imaging studies Results for orders placed or performed during the hospital encounter of 05/05/20 (from the past 24 hour(s))  Glucose, capillary     Status: None   Collection Time: 06/08/20  4:20 PM  Result Value Ref Range   Glucose-Capillary 89 70 - 99 mg/dL   Comment 1 Notify RN    Comment 2 Document in Chart   Glucose, capillary     Status: Abnormal   Collection Time: 06/08/20  8:31 PM  Result Value Ref Range   Glucose-Capillary 128 (H) 70 - 99 mg/dL   Comment 1 Notify RN    Comment 2 Document in Chart   Glucose, capillary     Status: Abnormal   Collection Time: 06/08/20 11:40 PM  Result Value Ref Range   Glucose-Capillary 122 (H) 70 - 99 mg/dL   Comment 1 Notify RN    Comment 2 Document in Chart   Glucose, capillary     Status: Abnormal   Collection Time: 06/09/20  3:35 AM  Result Value Ref Range   Glucose-Capillary 132 (H) 70 - 99 mg/dL   Comment 1 Notify RN    Comment 2 Document in Chart   CBC with Differential/Platelet     Status: Abnormal   Collection Time: 06/09/20  3:37 AM  Result Value Ref Range   WBC 8.8 4.0 - 10.5 K/uL   RBC 2.65 (L) 4.22 - 5.81 MIL/uL   Hemoglobin 8.1 (L) 13.0 - 17.0 g/dL   HCT 82.926.5 (L) 39 - 52 %   MCV 100.0 80.0 - 100.0 fL   MCH 30.6 26.0 - 34.0 pg   MCHC 30.6 30.0 - 36.0 g/dL   RDW 56.217.7 (H) 13.011.5 - 86.515.5 %   Platelets 388 150 - 400 K/uL   nRBC 0.0 0.0 - 0.2 %   Neutrophils Relative % 65 %   Neutro Abs 5.7 1.7 - 7.7 K/uL   Lymphocytes Relative 14 %   Lymphs Abs 1.3 0.7 - 4.0 K/uL   Monocytes Relative 15 %   Monocytes Absolute 1.3 (H) 0.1 - 1.0 K/uL   Eosinophils Relative 2 %   Eosinophils Absolute 0.2 0.0 - 0.5 K/uL   Basophils Relative 1 %   Basophils Absolute 0.1 0.0 - 0.1 K/uL   Immature Granulocytes 3 %   Abs Immature Granulocytes 0.29 (H) 0.00 - 0.07 K/uL  Basic  metabolic panel     Status: Abnormal   Collection Time: 06/09/20  3:37 AM  Result Value Ref Range   Sodium 133 (L) 135 - 145 mmol/L   Potassium 3.6 3.5 - 5.1 mmol/L   Chloride 102 98 - 111 mmol/L   CO2 25 22 - 32 mmol/L   Glucose, Bld 141 (H) 70 - 99 mg/dL   BUN 14 8 - 23 mg/dL   Creatinine, Ser 7.840.61 0.61 - 1.24 mg/dL   Calcium 8.2 (L) 8.9 - 10.3 mg/dL   GFR, Estimated >69>60 >62>60 mL/min   Anion gap 6 5 - 15  Magnesium  Status: None   Collection Time: 06/09/20  3:37 AM  Result Value Ref Range   Magnesium 1.7 1.7 - 2.4 mg/dL  Phosphorus     Status: None   Collection Time: 06/09/20  3:37 AM  Result Value Ref Range   Phosphorus 3.1 2.5 - 4.6 mg/dL  Glucose, capillary     Status: Abnormal   Collection Time: 06/09/20  9:06 AM  Result Value Ref Range   Glucose-Capillary 123 (H) 70 - 99 mg/dL   Comment 1 Notify RN    Comment 2 Document in Chart   Glucose, capillary     Status: Abnormal   Collection Time: 06/09/20 11:48 AM  Result Value Ref Range   Glucose-Capillary 144 (H) 70 - 99 mg/dL   Comment 1 Notify RN    Comment 2 Document in Chart     No results found for this or any previous visit (from the past 240 hour(s)).   Radiology Studies: No results found. DG Swallowing Func-Speech Pathology  Final Result    DG Abd Portable 1V  Final Result    DG Chest Port 1 View  Final Result    US Abdomen Limited RUQ (LIVER/GB)  Final Result    CT ABDOMEN PELVIS WO CONTRAST  Final Result    DG Chest Port 1 View  Final Result    IR GASTROSTOMY TUBE MOD SED  Final Result    DG Abd Portable 1V  Final Result    DG Swallowing Func-Speech Pathology  Final Result    CT HEAD W & WO CONTRAST  Final Result    DG CHEST PORT 1 VIEW  Final Result    CT ABDOMEN PELVIS W CONTRAST  Final Result    CT CHEST W CONTRAST  Final Result    DG CHEST PORT 1 VIEW  Final Result    DG CHEST PORT 1 VIEW  Final Result    DG CHEST PORT 1 VIEW  Final Result    CT HEAD WO CONTRAST    Final Result    DG CHEST PORT 1 VIEW  Final Result    CT ANGIO NECK W OR WO CONTRAST  Final Result    CT ANGIO HEAD W OR WO CONTRAST  Final Result    CT HEAD WO CONTRAST  Final Result    CT Head Wo Contrast  Final Result    DG Chest Port 1 View  Final Result    CT HEAD CODE STROKE WO CONTRAST  Final Result      Scheduled Meds: . carvedilol  6.25 mg Per Tube BID WC  . chlorhexidine  15 mL Mouth Rinse BID  . feeding supplement (OSMOLITE 1.5 CAL)  780 mL Per Tube Q24H  . free water  200 mL Per Tube Q4H  . insulin aspart  0-9 Units Subcutaneous Q4H  . insulin glargine  15 Units Subcutaneous Daily  . mouth rinse  15 mL Mouth Rinse q12n4p  . pantoprazole sodium  40 mg Per Tube BID  . polyethylene glycol powder  0.5 Container Per Tube Once  . sennosides  5 mL Per Tube BID  . sodium chloride flush  10-40 mL Intracatheter Q12H   PRN Meds: acetaminophen **OR** acetaminophen (TYLENOL) oral liquid 160 mg/5 mL **OR** acetaminophen, hydrALAZINE, levalbuterol, sodium chloride flush Continuous Infusions:   LOS: 35 days  Time spent: Greater than 50% of the 35 minute visit was spent in counseling/coordination of care for the patient as laid out in the A&P.   Onalee Hua  Stanton Kissoon, MD [facility] 06/09/2020, 2:58 PM

## 2020-06-09 NOTE — Progress Notes (Signed)
Patient admitted to room 10 arrived via a stretcher accompanied by a nurse alert and oriented x4, denies pain  Oriented to the unit and room. Takes medication whole placed on tele #10.

## 2020-06-09 NOTE — Progress Notes (Signed)
Physical Therapy Treatment Patient Details Name: Alan Everett MRN: [id number] DOB: 01-25-1956 Today's Date: 06/09/2020    History of Present Illness Patient is a 64 y/o male with PMH ETOH use, tobacco abuse, admitted with R side weakness,sensory loss and speech deficits.  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    PT Comments    Pt tolerates treatment well, following commands with visual and tactile cues but still not consistently with verbal cues. Pt is able to tolerate multiple transfer trials and is able to initiate beginnings of gait training with one side step at the edge of bed. Pt also demonstrates some improvement in sitting balance and awareness of his balance deficits this session, correcting minor losses of balance with LUE support. Pt will benefit from continued acute PT POC to reduce caregiver burden and falls risk. PT continues to recommend SNF placement.   Follow Up Recommendations  SNF;Supervision/Assistance - 24 hour     Equipment Recommendations  Wheelchair cushion (measurements PT);Wheelchair (measurements PT);Hospital bed;Other (comment)    Recommendations for Other Services       Precautions / Restrictions Precautions Precautions: Fall;Other (comment) Precaution Comments: L handmitt, G tube, no functional use of RUE; R side neglect Restrictions Weight Bearing Restrictions: No    Mobility  Bed Mobility Overal bed mobility: Needs Assistance Bed Mobility: Supine to Sit     Supine to sit: Max assist Sit to supine: Max assist   General bed mobility comments: PT assist to mobilize LEs to edge of bed, pt uses PT UE support to pull into sitting  Transfers Overall transfer level: Needs assistance Equipment used: 1 person hand held assist Transfers: Sit to/from Stand Sit to Stand: Max assist;From elevated surface         General transfer comment: PT provides R knee block and LUE support, pt  performs 4 sit to stand trials during session, standing for a max of 10 seconds  Ambulation/Gait Ambulation/Gait assistance: Max Chemical engineer (Feet): 1 Feet Assistive device: 1 person hand held assist Gait Pattern/deviations: Shuffle Gait velocity: reduced Gait velocity interpretation: <1.31 ft/sec, indicative of household ambulator General Gait Details: PT assists pt side shuffle for 1 step toward R side at edge of bed, pt requires totalA to slide RLE on ground toward R side and maxA and R knee block and stepping with LLE   Stairs             Wheelchair Mobility    Modified Rankin (Stroke Patients Only) Modified Rankin (Stroke Patients Only) Pre-Morbid Rankin Score: No symptoms Modified Rankin: Severe disability     Balance Overall balance assessment: Needs assistance Sitting-balance support: Feet supported;Single extremity supported Sitting balance-Leahy Scale: Poor Sitting balance - Comments: reliant on LUE support of bed or railing, is able to maintain with minG-minA with LUE support Postural control: Right lateral lean Standing balance support: Bilateral upper extremity supported Standing balance-Leahy Scale: Poor Standing balance comment: modA with LUE support and R knee block                            Cognition Arousal/Alertness: Awake/alert Behavior During Therapy: Flat affect Overall Cognitive Status: Difficult to assess                                 General Comments: impaired communication      Exercises Other Exercises  Other Exercises: PROM RUE elbow flexion/extension and shoulder flexion, stretching    General Comments General comments (skin integrity, edema, etc.): VSS on RA      Pertinent Vitals/Pain Pain Assessment: Faces Faces Pain Scale: Hurts a little bit Pain Location: general discomfort with PROM Pain Descriptors / Indicators: Grimacing Pain Intervention(s): Monitored during session    Home Living                       Prior Function            PT Goals (current goals can now be found in the care plan section) Acute Rehab PT Goals Patient Stated Goal: PT unable to comprehend pt's garbled speech Progress towards PT goals: Progressing toward goals (very slowly)    Frequency    Min 3X/week      PT Plan Current plan remains appropriate    Co-evaluation              AM-PAC PT "6 Clicks" Mobility   Outcome Measure  Help needed turning from your back to your side while in a flat bed without using bedrails?: Total Help needed moving from lying on your back to sitting on the side of a flat bed without using bedrails?: Total Help needed moving to and from a bed to a chair (including a wheelchair)?: Total Help needed standing up from a chair using your arms (e.g., wheelchair or bedside chair)?: Total Help needed to walk in hospital room?: Total Help needed climbing 3-5 steps with a railing? : Total 6 Click Score: 6    End of Session Equipment Utilized During Treatment: Gait belt Activity Tolerance: Patient tolerated treatment well Patient left: in bed;with call bell/phone within reach;with bed alarm set;with restraints reapplied (mitt reapplied) Nurse Communication: Mobility status PT Visit Diagnosis: Unsteadiness on feet (R26.81);Muscle weakness (generalized) (M62.81);Difficulty in walking, not elsewhere classified (R26.2);Other symptoms and signs involving the nervous system (R29.898);Hemiplegia and hemiparesis Hemiplegia - Right/Left: Right Hemiplegia - dominant/non-dominant: Dominant Hemiplegia - caused by: Nontraumatic intracerebral hemorrhage     Time: 2952-8413 PT Time Calculation (min) (ACUTE ONLY): 18 min  Charges:  $Therapeutic Activity: 8-22 mins                     Arlyss Gandy, PT, DPT Acute Rehabilitation Pager: [phone]    Arlyss Gandy 06/09/2020, 5:19 PM

## 2020-06-10 LAB — GLUCOSE, CAPILLARY
Glucose-Capillary: 122 mg/dL — ABNORMAL HIGH (ref 70–99)
Glucose-Capillary: 85 mg/dL (ref 70–99)
Glucose-Capillary: 95 mg/dL (ref 70–99)
Glucose-Capillary: 94 mg/dL (ref 70–99)
Glucose-Capillary: 97 mg/dL (ref 70–99)
Glucose-Capillary: 128 mg/dL — ABNORMAL HIGH (ref 70–99)

## 2020-06-10 NOTE — Progress Notes (Signed)
PROGRESS NOTE    Bethel Gaglio   GLO:[id number]  DOB: 06/30/1956  DOA: 05/05/2020     36  PCP: Patient, No Pcp Per  CC: weakness  Hospital Course: 64 y.o. male with a PMHx of alcohol use and tobacco abuse. Patient presented to the ED via EMS on 10/6 as a Code Stroke for acute onset of right sided weakness, sensory loss and aphasia. CT head revealed an enlarging L thalamocapsular IPH w/ increasing IVH from L ventricle into 3rd and 4th ventricles. Neurosurgery was initially consulted for EVD consideration which was then deferred.   UDS positive for cocaine.    Admitted for stroke work-up. Hospital course complicated by pneumonia and recurrent blood loss anemia. Pending placement at this time.   Interval History:  Still unable to interact or follow any commands this morning.  Laying in bed half asleep and staring out the window.  Old records reviewed in assessment of this patient  ROS: Review of systems not obtained due to patient factors. Severe cognitive impairment   Assessment & Plan: Acute Hemorrhagic stroke/cerebral edema -Likely in the setting of severe hypertension, alcohol and cocaine use. -Because of IVH, neurosurgery was consulted. EVD was deferred.   -Subsequent CT scans of the head showed stable findings.   -He is alert and awake but has global aphasia, unable to follow commands. -No change in neurological status in at least last 3 weeks. -Not on antiplatelets.  LDL 80.  Moderate to severe dysphagia -Secondary to stroke.  PEG tube placed on 05/21/2020. -Aspiration precautions -Albumin level low at 1.8.  Bibasilar pneumonia -resolved -CT scan 10/12 showed bibasilar pneumonia.   -Treated with broad-spectrum antibiotics.   -WBC normalized.  No longer having fever.  Klebsiella UTI  -resolved -Urine culture report from 10/10 with more than 1000 CFU per mL of Klebsiella pneumoniae.   -Completed 5-day course of IV Rocephin.   Acute on chronic anemia -Total  of 6 units of PRBC transfusions given in this admission. -Hemoglobin stable 8-9 g/dL -GI consult appreciated.  Currently endoscopic evaluation is on hold. -Continue PPI twice daily.  Sinus tachycardia Essential hypertension -Heart rate and blood pressure both are stable on Coreg 3.125 mg daily.  Hypernatremia - resolved   Elevated liver enzymes -Right upper quadrant ultrasound-cholelithiasis without acute cholecystitis -Chronic mild elevation of liver enzymes persist.  Tobacco Abuse -Counseled to quit using this -Given current neurologic status, would be unable to continue any further substance abuse at this point  Cocaine abuse -Counseled to quit using this -Given current neurologic status, would be unable to continue any further substance abuse at this point  GERD -PPI twice daily  Goals of care -Poor neurological recovery so far.  Poor expectation of further neurological recovery -Palliative care was consulted in the past.  Patient remains full code.    Antimicrobials: None further   DVT prophylaxis: SCD Code Status: Full Family Communication: none present Disposition Plan: Status is: Inpatient  Remains inpatient appropriate because:Unsafe d/c plan and Inpatient level of care appropriate due to severity of illness   Dispo: The patient is from: Home              Anticipated d/c is to: SNF              Anticipated d/c date is: > 3 days. Placement difficult due to lack of insurance and cocaine positive on admission although he will not be able to continue with substance abuse anyways given severe neurologic deficits.  Other barrier appears to  be lack of insurance              Patient currently is not medically stable to d/c.   Objective: Blood pressure 124/70, pulse 93, temperature 98.2 F (36.8 C), temperature source Oral, resp. rate 16, weight 61.9 kg, SpO2 100 %.  Examination: General appearance: resting in bed comfortable; not able to follow commands or  interact Head: Normocephalic, without obvious abnormality, atraumatic Eyes: EOMI Lungs: clear to auscultation bilaterally Heart: regular rate and rhythm and S1, S2 normal Abdomen: soft, NT, ND, BS present; PEG in palce Extremities: no edema Skin: xerosis Neurologic: no movement with right side; moves LUE spontaneously but cannot follow any commands  Consultants:   Neuro  GI  Procedures:   none  Data Reviewed: I have personally reviewed following labs and imaging studies Results for orders placed or performed during the hospital encounter of 05/05/20 (from the past 24 hour(s))  Glucose, capillary     Status: None   Collection Time: 06/09/20  5:22 PM  Result Value Ref Range   Glucose-Capillary 83 70 - 99 mg/dL   Comment 1 Notify RN    Comment 2 Document in Chart   Glucose, capillary     Status: Abnormal   Collection Time: 06/09/20  7:24 PM  Result Value Ref Range   Glucose-Capillary 122 (H) 70 - 99 mg/dL   Comment 1 Notify RN    Comment 2 Document in Chart   Glucose, capillary     Status: Abnormal   Collection Time: 06/09/20 11:11 PM  Result Value Ref Range   Glucose-Capillary 124 (H) 70 - 99 mg/dL   Comment 1 Notify RN    Comment 2 Document in Chart   Glucose, capillary     Status: Abnormal   Collection Time: 06/10/20  3:07 AM  Result Value Ref Range   Glucose-Capillary 122 (H) 70 - 99 mg/dL   Comment 1 Notify RN    Comment 2 Document in Chart   Glucose, capillary     Status: None   Collection Time: 06/10/20  7:33 AM  Result Value Ref Range   Glucose-Capillary 85 70 - 99 mg/dL   Comment 1 Notify RN    Comment 2 Document in Chart   Glucose, capillary     Status: None   Collection Time: 06/10/20 12:26 PM  Result Value Ref Range   Glucose-Capillary 95 70 - 99 mg/dL  Glucose, capillary     Status: None   Collection Time: 06/10/20 12:33 PM  Result Value Ref Range   Glucose-Capillary 94 70 - 99 mg/dL   Comment 1 Notify RN    Comment 2 Document in Chart     No  results found for this or any previous visit (from the past 240 hour(s)).   Radiology Studies: No results found. DG Swallowing Func-Speech Pathology  Final Result    DG Abd Portable 1V  Final Result    DG Chest Port 1 View  Final Result    US Abdomen Limited RUQ (LIVER/GB)  Final Result    CT ABDOMEN PELVIS WO CONTRAST  Final Result    DG Chest Port 1 View  Final Result    IR GASTROSTOMY TUBE MOD SED  Final Result    DG Abd Portable 1V  Final Result    DG Swallowing Func-Speech Pathology  Final Result    CT HEAD W & WO CONTRAST  Final Result    DG CHEST PORT 1 VIEW  Final Result  CT ABDOMEN PELVIS W CONTRAST  Final Result    CT CHEST W CONTRAST  Final Result    DG CHEST PORT 1 VIEW  Final Result    DG CHEST PORT 1 VIEW  Final Result    DG CHEST PORT 1 VIEW  Final Result    CT HEAD WO CONTRAST  Final Result    DG CHEST PORT 1 VIEW  Final Result    CT ANGIO NECK W OR WO CONTRAST  Final Result    CT ANGIO HEAD W OR WO CONTRAST  Final Result    CT HEAD WO CONTRAST  Final Result    CT Head Wo Contrast  Final Result    DG Chest Port 1 View  Final Result    CT HEAD CODE STROKE WO CONTRAST  Final Result      Scheduled Meds: . carvedilol  6.25 mg Per Tube BID WC  . chlorhexidine  15 mL Mouth Rinse BID  . feeding supplement (OSMOLITE 1.5 CAL)  780 mL Per Tube Q24H  . free water  200 mL Per Tube Q4H  . insulin aspart  0-9 Units Subcutaneous Q4H  . insulin glargine  15 Units Subcutaneous Daily  . mouth rinse  15 mL Mouth Rinse q12n4p  . pantoprazole sodium  40 mg Per Tube BID  . polyethylene glycol powder  0.5 Container Per Tube Once  . sennosides  5 mL Per Tube BID  . sodium chloride flush  10-40 mL Intracatheter Q12H   PRN Meds: acetaminophen **OR** acetaminophen (TYLENOL) oral liquid 160 mg/5 mL **OR** acetaminophen, hydrALAZINE, levalbuterol, sodium chloride flush Continuous Infusions:   LOS: 36 days  Time spent: Greater than  50% of the 35 minute visit was spent in counseling/coordination of care for the patient as laid out in the A&P.   Lewie Chamber, MD [facility] 06/10/2020, 2:41 PM

## 2020-06-10 NOTE — TOC Progression Note (Signed)
Transition of Care [facility]) - Progression Note    Patient Details  Name: Alan Everett MRN: [id number] Date of Birth: [birth year]  Transition of [facility]) CM/SW Contact  Baldemar Lenis, [place] Phone Number: 06/10/2020, 3:00 PM  Clinical Narrative:   CSW spoke with Financial Counselor to ask about status of Medicaid/Disability. Medicaid application is waiting on the patient's niece to complete the paperwork, it was mailed out to her on 11/8. CSW attempted to contact niece to make sure she received the paperwork, left a voicemail. CSW to follow.    Expected Discharge Plan: Skilled Nursing Facility Barriers to Discharge: Continued Medical Work up, SNF Pending payor source - LOG, SNF Pending bed offer, SNF Pending Medicaid  Expected Discharge Plan and Services Expected Discharge Plan: Skilled Nursing Facility     Post Acute Care Choice: Skilled Nursing Facility Living arrangements for the past 2 months: Single Family Home                                       Social Determinants of Health (SDOH) Interventions    Readmission Risk Interventions No flowsheet data found.

## 2020-06-10 NOTE — Progress Notes (Signed)
  Speech Language Pathology Treatment: Cognitive-Linquistic (Aphasia )  Patient Details Name: Alan Everett MRN: [id number] DOB: [birth year] Today's Date: 06/10/2020 Time: 3267-1245 SLP Time Calculation (min) (ACUTE ONLY): 16 min  Assessment / Plan / Recommendation Clinical Impression  Pt was seen for aphasia treatment. He expressed c/o fatigue and it was agreed that the session be abbreviated. His participation during this session was limited and speech intelligibility was more reduced compared to the last session. He responded to yes/no questions during functional tasks with inconsistency. He demonstrated 0% accuracy with phrase completion and structured naming tasks. He produced "she's fair" and vocalized in response to questions with intonation of "I don't konw." Additional tasks were deferred due to pt's increasing difficulty maintaining alertness and it was agreed that treatment will be attempted earlier in the day in the future. SLP will continue to follow pt.    HPI HPI: Alan Everett is a 64 y.o. male with a PMHx of alcohol use and tobacco abuse who presents acutely to the ED via EMS as a Code Stroke for acute onset of right sided weakness. Patient with disoriented, right leg weakness, right limb ataxia, right decreased sensation and Global aphasia on exam. 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 Worsening of intraventricular extension.  MBS 10/19 mod-severe dysphagia, continue NPO. PEG 10/22.  Repeat MBS 10/30 with improvements. Started dys1/nectar 11/2.       SLP Plan  Continue with current plan of care       Recommendations  Diet recommendations: Dysphagia 1 (puree);Nectar-thick liquid Liquids provided via: Cup Medication Administration: Via alternative means Supervision: Staff to assist with self feeding;Full supervision/cueing for compensatory strategies Compensations: Minimize environmental distractions;Monitor for anterior loss Postural Changes and/or Swallow  Maneuvers: Seated upright 90 degrees                Oral Care Recommendations: Oral care BID Follow up Recommendations: Skilled Nursing facility SLP Visit Diagnosis: Aphasia (R47.01) Plan: Continue with current plan of care       Alan Everett I. Vear Clock, MS, CCC-SLP Acute Rehabilitation Services Office number [phone] Pager [phone]                 Alan Everett 06/10/2020, 5:05 PM

## 2020-06-10 NOTE — TOC Progression Note (Signed)
Transition of Care [facility]) - Progression Note    Patient Details  Name: Andrea Colglazier MRN: [id number] Date of Birth: 15-Nov-1955  [facility] of [facility] CM/SW Contact  Kermit Balo, RN Phone Number: 06/10/2020, 9:20 AM  Clinical Narrative:    CM received message from financial counseling that they will complete disability/ medicaid forms with pts niece and submit. Pt faxed out for SNF rehab.    Expected Discharge Plan: Skilled Nursing Facility Barriers to Discharge: Continued Medical Work up, SNF Pending payor source - LOG, SNF Pending bed offer, SNF Pending Medicaid  Expected Discharge Plan and Services Expected Discharge Plan: Skilled Nursing Facility     Post Acute Care Choice: Skilled Nursing Facility Living arrangements for the past 2 months: Single Family Home                                       Social Determinants of Health (SDOH) Interventions    Readmission Risk Interventions No flowsheet data found.

## 2020-06-11 ENCOUNTER — Telehealth: Payer: Self-pay | Admitting: *Deleted

## 2020-06-11 LAB — GLUCOSE, CAPILLARY
Glucose-Capillary: 126 mg/dL — ABNORMAL HIGH (ref 70–99)
Glucose-Capillary: 129 mg/dL — ABNORMAL HIGH (ref 70–99)
Glucose-Capillary: 88 mg/dL (ref 70–99)
Glucose-Capillary: 91 mg/dL (ref 70–99)
Glucose-Capillary: 94 mg/dL (ref 70–99)
Glucose-Capillary: 96 mg/dL (ref 70–99)

## 2020-06-11 NOTE — Telephone Encounter (Signed)
Reviewed chart. Patient currently in [facility] for stroke/bleed. Will cnl apts with Dr. Donneta Romberg.

## 2020-06-11 NOTE — Progress Notes (Signed)
Occupational Therapy Treatment Patient Details Name: Alan Everett MRN: [id number] DOB: 1955-10-01 Today's Date: 06/11/2020    History of present illness Patient is a 64 y/o male with PMH ETOH use, tobacco abuse, admitted with R side weakness,sensory loss and speech deficits.  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   OT comments  Pt verbalizing this session and alert at this time. Pt attempting to make request known. Pt visually tracking to midline and positioned in chair position in bed. Recommend daily positioning in chair position or hoyer lift oob. Music [employer] playing in the room for enrichment and awareness. Pt reports "yes" to being in [employer] and educated in hospital. Recommend SNF for d/c planning.   Follow Up Recommendations  SNF    Equipment Recommendations  Wheelchair (measurements OT);Wheelchair cushion (measurements OT);Hospital bed    Recommendations for Other Services Other (comment)    Precautions / Restrictions Precautions Precautions: Fall Precaution Comments: L handmitt, G tube, no functional use of RUE; R side neglect       Mobility Bed Mobility Overal bed mobility: Needs Assistance Bed Mobility: Rolling;Supine to Sit;Sit to Supine Rolling: Max assist   Supine to sit: Max assist Sit to supine: Mod assist   General bed mobility comments: pt requires use of pad and total (A) to position R LE as it is abducted and knee flexion. pt grimace with repositioning. pt with pad positioned eob . pt with total (A) initially and progressed to static sitting min (A) intermediate min guard   Transfers Overall transfer level: Needs assistance   Transfers: Sit to/from Stand Sit to Stand: Max assist         General transfer comment: attempting sit <>stand x3 with sara stedy with achieving buttock off be surface x2 . pt unable to sustain due to poor R side extensino of core and trunk rotation to L     Balance Overall balance assessment: Needs assistance Sitting-balance support: Single extremity supported;Feet supported Sitting balance-Leahy Scale: Poor Sitting balance - Comments: reliance on L UE for support    Standing balance support: Single extremity supported;During functional activity Standing balance-Leahy Scale: Zero Standing balance comment: sara stedy utilized and requires heavy (A) for trunk from therapist                           ADL either performed or assessed with clinical judgement   ADL Overall ADL's : Needs assistance/impaired Eating/Feeding: NPO   Grooming: Wash/dry face;Minimal assistance;Bed level Grooming Details (indicate cue type and reason): pt cued to wipe face and initiates with L UE with delay but completed task .                               General ADL Comments: attempted sit<>Stand this session from EOB with sara stedy with noted trunk rotation to L side and needs total +2 next session to try      Vision   Additional Comments: R inattention. wears glasses. pt able to come to midline this session   Perception     Praxis      Cognition Arousal/Alertness: Awake/alert Behavior During Therapy: Flat affect Overall Cognitive Status: Difficult to assess                                 General Comments:  impaired communication but answering yes to all questions. pt does state "morning" in response to OT greeting of morning. pt turning and looking at staff with name call. pt bobbing head to music played in room Attempting to communicate need for bed to be leaned back some. pt states "thank ya in response"        Exercises     Shoulder Instructions       General Comments      Pertinent Vitals/ Pain       Pain Assessment: Faces Faces Pain Scale: Hurts even more Pain Location: discomfort with R hip movement Pain Descriptors / Indicators: Grimacing Pain Intervention(s): Monitored during  session;Repositioned  Home Living                                          Prior Functioning/Environment              Frequency  Min 2X/week        Progress Toward Goals  OT Goals(current goals can now be found in the care plan section)  Progress towards OT goals: Progressing toward goals  Acute Rehab OT Goals Patient Stated Goal: PT unable to comprehend pt's garbled speech OT Goal Formulation: Patient unable to participate in goal setting Time For Goal Achievement: 06/25/20 Potential to Achieve Goals: Fair ADL Goals Pt Will Perform Grooming: sitting;with min guard assist Pt Will Transfer to Toilet: with max assist;with transfer board;bedside commode Pt/caregiver will Perform Home Exercise Program: Right Upper extremity;With written HEP provided Additional ADL Goal #1: pt will look at object in midline 50% of session Additional ADL Goal #2: Pt will be able to track right and left 5 times in succesion with increased time Additional ADL Goal #3: Pt will follow 2 step command  3 out of 5 one step commands Additional ADL Goal #4: pt will sit EOB min guard (A) as prep for transfers  Plan Discharge plan remains appropriate;Frequency remains appropriate    Co-evaluation                 AM-PAC OT "6 Clicks" Daily Activity     Outcome Measure   Help from another person eating meals?: Total Help from another person taking care of personal grooming?: A Lot Help from another person toileting, which includes using toliet, bedpan, or urinal?: Total Help from another person bathing (including washing, rinsing, drying)?: Total Help from another person to put on and taking off regular upper body clothing?: Total Help from another person to put on and taking off regular lower body clothing?: Total 6 Click Score: 7    End of Session Equipment Utilized During Treatment: Gait belt  OT Visit Diagnosis: Other abnormalities of gait and mobility (R26.89);Muscle  weakness (generalized) (M62.81);Low vision, both eyes (H54.2);Other symptoms and signs involving cognitive function;Hemiplegia and hemiparesis Hemiplegia - Right/Left: Right Hemiplegia - dominant/non-dominant: Dominant   Activity Tolerance Patient tolerated treatment well   Patient Left in bed;with call bell/phone within reach;Other (comment) (in chair position with bed moved to focus on R visual field)   Nurse Communication Mobility status;Precautions;Other (comment) (talking to patient from door throughout day to encourage R)        Time: 4854-6270 OT Time Calculation (min): 26 min  Charges: OT General Charges $OT Visit: 1 Visit OT Treatments $Self Care/Home Management : 23-37 mins   Brynn, OTR/L  Acute Rehabilitation Services Pager: [phone] Office:  [phone] .    Mateo Flow 06/11/2020, 10:20 AM

## 2020-06-11 NOTE — Progress Notes (Signed)
Palliative Medicine RN Note: Discussed pt during team rounds.  Goals of care are clear for all aggressive care. Barrier to discharge is financial/insurance/placement related, which PMT cannot affect. At this time, our team will sign off. If new, palliative-specific needs arise, or if the family specifically requests to speak to [place] about changing goals of care, please re-consult [place].  Alan Chance Avaree Gilberti, RN, BSN, Alan Everett Palliative Medicine Team 06/11/2020 11:03 AM Office [phone]

## 2020-06-11 NOTE — Progress Notes (Signed)
Physical Therapy Treatment Patient Details Name: Alan Everett MRN: [id number] DOB: 1956-06-02 Today's Date: 06/11/2020    History of Present Illness Patient is a 64 y/o male with PMH ETOH use, tobacco abuse, admitted with R side weakness,sensory loss and speech deficits.  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    PT Comments    Pt tolerates treatment well but continues to require physical assistance for all mobility. Pt is better able to initiate transfer attempts this session, but needs maxA and R knee block to support and prevent fall. Pt demonstrates difficulty following commands to utilize RW at this time. Pt demonstrates improved awareness of sitting balance deficits, utilizing LUE support to stabilize. Pt will benefit from continued acute PT POC to reduce falls risk and caregiver burden. PT continues to recommend SNF placement at this time.  Follow Up Recommendations  SNF;Supervision/Assistance - 24 hour     Equipment Recommendations  Wheelchair cushion (measurements PT);Wheelchair (measurements PT);Hospital bed;Other (comment)    Recommendations for Other Services       Precautions / Restrictions Precautions Precautions: Fall Precaution Comments: L handmitt, G tube, no functional use of RUE; R side neglect Restrictions Weight Bearing Restrictions: No    Mobility  Bed Mobility Overal bed mobility: Needs Assistance Bed Mobility: Supine to Sit;Sit to Supine Rolling: Max assist   Supine to sit: Max assist;HOB elevated Sit to supine: Max assist;HOB elevated   General bed mobility comments: pt requires use of pad and total (A) to position R LE as it is abducted and knee flexion. pt grimace with repositioning. pt with pad positioned eob . pt with total (A) initially and progressed to static sitting min (A) intermediate min guard   Transfers Overall transfer level: Needs assistance Equipment used: 1 person  hand held assist;Rolling walker (2 wheeled) Transfers: Sit to/from Stand Sit to Stand: Max assist         General transfer comment: pt performs 5 sit to stand trials, 4 using bed rail for LUE support and one trial using RW for UE support. Pt with poor understanding of use of RW and having difficulty following PT visual commands  Ambulation/Gait                 Stairs             Wheelchair Mobility    Modified Rankin (Stroke Patients Only) Modified Rankin (Stroke Patients Only) Pre-Morbid Rankin Score: No symptoms Modified Rankin: Severe disability     Balance Overall balance assessment: Needs assistance Sitting-balance support: Single extremity supported;Feet supported Sitting balance-Leahy Scale: Poor Sitting balance - Comments: reliant on LUE support of bed or railing   Standing balance support: Single extremity supported Standing balance-Leahy Scale: Zero Standing balance comment: maxA with LUE support of railing                            Cognition Arousal/Alertness: Awake/alert Behavior During Therapy: Flat affect Overall Cognitive Status: Difficult to assess                                 General Comments: impaired communication, pt follows intermittent nonverbal visual and tactile cues during session. Pt demonstrates reduced awareness of deficits and safety      Exercises      General Comments General comments (skin integrity, edema, etc.): VSS on RA  Pertinent Vitals/Pain Pain Assessment: Faces Faces Pain Scale: No hurt Pain Location: discomfort with R hip movement Pain Descriptors / Indicators: Grimacing Pain Intervention(s): Monitored during session;Repositioned    Home Living                      Prior Function            PT Goals (current goals can now be found in the care plan section) Acute Rehab PT Goals Patient Stated Goal: PT unable to comprehend pt's garbled speech Progress towards  PT goals: Progressing toward goals (very slowly)    Frequency    Min 3X/week      PT Plan Current plan remains appropriate    Co-evaluation              AM-PAC PT "6 Clicks" Mobility   Outcome Measure  Help needed turning from your back to your side while in a flat bed without using bedrails?: Total Help needed moving from lying on your back to sitting on the side of a flat bed without using bedrails?: Total Help needed moving to and from a bed to a chair (including a wheelchair)?: Total Help needed standing up from a chair using your arms (e.g., wheelchair or bedside chair)?: Total Help needed to walk in hospital room?: Total Help needed climbing 3-5 steps with a railing? : Total 6 Click Score: 6    End of Session Equipment Utilized During Treatment: Gait belt Activity Tolerance: Patient tolerated treatment well Patient left: in bed;with call bell/phone within reach;with bed alarm set;with restraints reapplied Nurse Communication: Mobility status;Need for lift equipment PT Visit Diagnosis: Unsteadiness on feet (R26.81);Muscle weakness (generalized) (M62.81);Difficulty in walking, not elsewhere classified (R26.2);Other symptoms and signs involving the nervous system (R29.898);Hemiplegia and hemiparesis Hemiplegia - Right/Left: Right Hemiplegia - dominant/non-dominant: Dominant Hemiplegia - caused by: Nontraumatic intracerebral hemorrhage     Time: 1257-1313 PT Time Calculation (min) (ACUTE ONLY): 16 min  Charges:  $Therapeutic Activity: 8-22 mins                     Arlyss Gandy, PT, DPT Acute Rehabilitation Pager: [phone]    Arlyss Gandy 06/11/2020, 1:21 PM

## 2020-06-11 NOTE — Progress Notes (Signed)
PROGRESS NOTE    Alan Everett   QMV:[id number]  DOB: Jun 14, 1956  DOA: 05/05/2020     37  PCP: Patient, No Pcp Per  CC: weakness  Hospital Course: 64 y.o. male with a PMHx of alcohol use and tobacco abuse. Patient presented to the ED via EMS on 10/6 as a Code Stroke for acute onset of right sided weakness, sensory loss and aphasia. CT head revealed an enlarging L thalamocapsular IPH w/ increasing IVH from L ventricle into 3rd and 4th ventricles. Neurosurgery was initially consulted for EVD consideration which was then deferred.   UDS positive for cocaine.    Admitted for stroke work-up. Hospital course complicated by pneumonia and recurrent blood loss anemia. Pending placement at this time.   Interval History:  He was slightly more interactive this morning. He was able to raise his left arm some on command this morning but that was it; although more than he has previously been able to do with me.  No events overnight and overall remains stable.   Old records reviewed in assessment of this patient  ROS: Review of systems not obtained due to patient factors. Severe cognitive impairment   Assessment & Plan: Acute Hemorrhagic stroke/cerebral edema -Likely in the setting of severe hypertension, alcohol and cocaine use. -Because of IVH, neurosurgery was consulted. EVD was deferred.   -Subsequent CT scans of the head showed stable findings.   -He is alert and awake but has global aphasia, unable to follow commands. -No change in neurological status in at least last 3 weeks. -Not on antiplatelets.  LDL 80.  Moderate to severe dysphagia -Secondary to stroke.  PEG tube placed on 05/21/2020. -Aspiration precautions  Bibasilar pneumonia -resolved -CT scan 10/12 showed bibasilar pneumonia.   -Treated with broad-spectrum antibiotics.   -WBC normalized.  No longer having fever.  Klebsiella UTI  -resolved -Urine culture report from 10/10 with more than 1000 CFU per mL of  Klebsiella pneumoniae.   -Completed 5-day course of IV Rocephin.   Acute on chronic anemia -Total of 6 units of PRBC transfusions given in this admission. -Hemoglobin stable 8-9 g/dL -GI consult appreciated.  Currently endoscopic evaluation is on hold. -Continue PPI twice daily.  Sinus tachycardia Essential hypertension -Heart rate and blood pressure both are stable on Coreg 3.125 mg daily.  Hypernatremia - resolved   Elevated liver enzymes -Right upper quadrant ultrasound-cholelithiasis without acute cholecystitis -Chronic mild elevation of liver enzymes persist.  Tobacco Abuse -Counseled to quit using this -Given current neurologic status, would be unable to continue any further substance abuse at this point  Cocaine abuse -Counseled to quit using this -Given current neurologic status, would be unable to continue any further substance abuse at this point  GERD -PPI twice daily  Goals of care -Poor neurological recovery so far.  Poor expectation of further neurological recovery -Palliative care was consulted in the past.  Patient remains full code.    Antimicrobials: None further   DVT prophylaxis: SCD Code Status: Full Family Communication: none present Disposition Plan: Status is: Inpatient  Remains inpatient appropriate because:Unsafe d/c plan and no insurance and placement options limited; awaiting placement   Dispo: The patient is from: Home              Anticipated d/c is to: SNF              Anticipated d/c date is: > 3 days. Placement difficult due to lack of insurance and cocaine positive on admission although he will not  be able to continue with substance abuse anyways given severe neurologic deficits.  Other barrier appears to be lack of insurance              Patient currently is medically stable to d/c.   Objective: Blood pressure 129/72, pulse 87, temperature 97.7 F (36.5 C), temperature source Oral, resp. rate 16, weight 58.9 kg, SpO2 100  %.  Examination: General appearance: resting in bed comfortable; lifted left arm to command this morning Head: Normocephalic, without obvious abnormality, atraumatic Eyes: EOMI Lungs: clear to auscultation bilaterally Heart: regular rate and rhythm and S1, S2 normal Abdomen: soft, NT, ND, BS present; PEG in palce Extremities: no edema Skin: xerosis Neurologic: no movement with right side; moves LUE spontaneously and did lift LUE to command this morning  Consultants:   Neuro  GI  Procedures:   none  Data Reviewed: I have personally reviewed following labs and imaging studies Results for orders placed or performed during the hospital encounter of 05/05/20 (from the past 24 hour(s))  Glucose, capillary     Status: None   Collection Time: 06/10/20  3:45 PM  Result Value Ref Range   Glucose-Capillary 97 70 - 99 mg/dL   Comment 1 Notify RN    Comment 2 Document in Chart   Glucose, capillary     Status: Abnormal   Collection Time: 06/10/20  8:22 PM  Result Value Ref Range   Glucose-Capillary 128 (H) 70 - 99 mg/dL   Comment 1 Notify RN    Comment 2 Document in Chart   Glucose, capillary     Status: Abnormal   Collection Time: 06/11/20 12:31 AM  Result Value Ref Range   Glucose-Capillary 129 (H) 70 - 99 mg/dL   Comment 1 Notify RN    Comment 2 Document in Chart   Glucose, capillary     Status: Abnormal   Collection Time: 06/11/20  4:23 AM  Result Value Ref Range   Glucose-Capillary 126 (H) 70 - 99 mg/dL   Comment 1 Notify RN    Comment 2 Document in Chart   Glucose, capillary     Status: None   Collection Time: 06/11/20  7:39 AM  Result Value Ref Range   Glucose-Capillary 91 70 - 99 mg/dL   Comment 1 Notify RN    Comment 2 Document in Chart   Glucose, capillary     Status: None   Collection Time: 06/11/20 11:51 AM  Result Value Ref Range   Glucose-Capillary 94 70 - 99 mg/dL   Comment 1 Notify RN    Comment 2 Document in Chart     No results found for this or any  previous visit (from the past 240 hour(s)).   Radiology Studies: No results found. DG Swallowing Func-Speech Pathology  Final Result    DG Abd Portable 1V  Final Result    DG Chest Port 1 View  Final Result    US Abdomen Limited RUQ (LIVER/GB)  Final Result    CT ABDOMEN PELVIS WO CONTRAST  Final Result    DG Chest Port 1 View  Final Result    IR GASTROSTOMY TUBE MOD SED  Final Result    DG Abd Portable 1V  Final Result    DG Swallowing Func-Speech Pathology  Final Result    CT HEAD W & WO CONTRAST  Final Result    DG CHEST PORT 1 VIEW  Final Result    CT ABDOMEN PELVIS W CONTRAST  Final Result  CT CHEST W CONTRAST  Final Result    DG CHEST PORT 1 VIEW  Final Result    DG CHEST PORT 1 VIEW  Final Result    DG CHEST PORT 1 VIEW  Final Result    CT HEAD WO CONTRAST  Final Result    DG CHEST PORT 1 VIEW  Final Result    CT ANGIO NECK W OR WO CONTRAST  Final Result    CT ANGIO HEAD W OR WO CONTRAST  Final Result    CT HEAD WO CONTRAST  Final Result    CT Head Wo Contrast  Final Result    DG Chest Port 1 View  Final Result    CT HEAD CODE STROKE WO CONTRAST  Final Result      Scheduled Meds: . carvedilol  6.25 mg Per Tube BID WC  . chlorhexidine  15 mL Mouth Rinse BID  . feeding supplement (OSMOLITE 1.5 CAL)  780 mL Per Tube Q24H  . free water  200 mL Per Tube Q4H  . insulin aspart  0-9 Units Subcutaneous Q4H  . insulin glargine  15 Units Subcutaneous Daily  . mouth rinse  15 mL Mouth Rinse q12n4p  . pantoprazole sodium  40 mg Per Tube BID  . polyethylene glycol powder  0.5 Container Per Tube Once  . sennosides  5 mL Per Tube BID  . sodium chloride flush  10-40 mL Intracatheter Q12H   PRN Meds: acetaminophen **OR** acetaminophen (TYLENOL) oral liquid 160 mg/5 mL **OR** acetaminophen, hydrALAZINE, levalbuterol, sodium chloride flush Continuous Infusions:   LOS: 37 days  Time spent: Greater than 50% of the 35 minute visit was  spent in counseling/coordination of care for the patient as laid out in the A&P.   Lewie Chamber, MD [facility] 06/11/2020, 1:31 PM

## 2020-06-12 LAB — GLUCOSE, CAPILLARY
Glucose-Capillary: 110 mg/dL — ABNORMAL HIGH (ref 70–99)
Glucose-Capillary: 112 mg/dL — ABNORMAL HIGH (ref 70–99)
Glucose-Capillary: 122 mg/dL — ABNORMAL HIGH (ref 70–99)
Glucose-Capillary: 73 mg/dL (ref 70–99)
Glucose-Capillary: 86 mg/dL (ref 70–99)
Glucose-Capillary: 95 mg/dL (ref 70–99)
Glucose-Capillary: 99 mg/dL (ref 70–99)

## 2020-06-12 NOTE — Progress Notes (Signed)
PROGRESS NOTE    Alan Everett   QBV:[id number]  DOB: 10-Jan-1956  DOA: 05/05/2020     38  PCP: Patient, No Pcp Per  CC: weakness  Hospital Course: 64 y.o. male with a PMHx of alcohol use and tobacco abuse. Patient presented to the ED via EMS on 10/6 as a Code Stroke for acute onset of right sided weakness, sensory loss and aphasia. CT head revealed an enlarging L thalamocapsular IPH w/ increasing IVH from L ventricle into 3rd and 4th ventricles. Neurosurgery was initially consulted for EVD consideration which was then deferred.   UDS positive for cocaine.    Admitted for stroke work-up. Hospital course complicated by pneumonia and recurrent blood loss anemia. Pending placement at this time.   Interval History:  No events overnight.  Lying in bed in his usual state.  Does not follow commands based off words alone but will mimic movements made.  Old records reviewed in assessment of this patient  ROS: Review of systems not obtained due to patient factors. Severe cognitive impairment   Assessment & Plan: Acute Hemorrhagic stroke/cerebral edema -Likely in the setting of severe hypertension, alcohol and cocaine use. -Because of IVH, neurosurgery was consulted. EVD was deferred.   -Subsequent CT scans of the head showed stable findings.   -He is alert and awake but has global aphasia, unable to follow commands. -No change in neurological status in at least last 3 weeks. -Not on antiplatelets.  LDL 80.  Moderate to severe dysphagia -Secondary to stroke.  PEG tube placed on 05/21/2020. -Aspiration precautions  Bibasilar pneumonia -resolved -CT scan 10/12 showed bibasilar pneumonia.   -Treated with broad-spectrum antibiotics.   -WBC normalized.  No longer having fever.  Klebsiella UTI  -resolved -Urine culture report from 10/10 with more than 1000 CFU per mL of Klebsiella pneumoniae.   -Completed 5-day course of IV Rocephin.   Acute on chronic anemia -Total of 6 units  of PRBC transfusions given in this admission. -Hemoglobin stable 8-9 g/dL -GI consult appreciated.  Currently endoscopic evaluation is on hold. -Continue PPI twice daily.  Sinus tachycardia Essential hypertension -Heart rate and blood pressure both are stable on Coreg 3.125 mg daily.  Hypernatremia - resolved   Elevated liver enzymes -Right upper quadrant ultrasound-cholelithiasis without acute cholecystitis -Chronic mild elevation of liver enzymes persist.  Tobacco Abuse -Counseled to quit using this -Given current neurologic status, would be unable to continue any further substance abuse at this point  Cocaine abuse -Counseled to quit using this -Given current neurologic status, would be unable to continue any further substance abuse at this point  GERD -PPI twice daily  Goals of care -Poor neurological recovery so far.  Poor expectation of further neurological recovery -Palliative care was consulted in the past.  Patient remains full code.    Antimicrobials: None further   DVT prophylaxis: SCD Code Status: Full Family Communication: none present Disposition Plan: Status is: Inpatient  Remains inpatient appropriate because:Unsafe d/c plan and no insurance and placement options limited; awaiting placement   Dispo: The patient is from: Home              Anticipated d/c is to: SNF              Anticipated d/c date is: > 3 days. Placement difficult due to lack of insurance and cocaine positive on admission although he will not be able to continue with substance abuse anyways given severe neurologic deficits.  Other barrier appears to be lack of  insurance              Patient currently is medically stable to d/c.   Objective: Blood pressure 108/66, pulse 85, temperature 98.8 F (37.1 C), temperature source Oral, resp. rate 18, weight 60.1 kg, SpO2 100 %.  Examination: General appearance: resting in bed comfortable; lifted left arm to command this morning Head:  Normocephalic, without obvious abnormality, atraumatic Eyes: EOMI Lungs: clear to auscultation bilaterally Heart: regular rate and rhythm and S1, S2 normal Abdomen: soft, NT, ND, BS present; PEG in palce Extremities: no edema Skin: xerosis Neurologic: no movement with right side; moves LUE spontaneously  Consultants:   Neuro  GI  Procedures:   none  Data Reviewed: I have personally reviewed following labs and imaging studies Results for orders placed or performed during the hospital encounter of 05/05/20 (from the past 24 hour(s))  Glucose, capillary     Status: None   Collection Time: 06/11/20  4:30 PM  Result Value Ref Range   Glucose-Capillary 96 70 - 99 mg/dL   Comment 1 Notify RN    Comment 2 Document in Chart   Glucose, capillary     Status: None   Collection Time: 06/11/20  7:57 PM  Result Value Ref Range   Glucose-Capillary 88 70 - 99 mg/dL   Comment 1 Notify RN    Comment 2 Document in Chart   Glucose, capillary     Status: Abnormal   Collection Time: 06/12/20 12:09 AM  Result Value Ref Range   Glucose-Capillary 112 (H) 70 - 99 mg/dL   Comment 1 Notify RN    Comment 2 Document in Chart   Glucose, capillary     Status: Abnormal   Collection Time: 06/12/20  4:34 AM  Result Value Ref Range   Glucose-Capillary 122 (H) 70 - 99 mg/dL   Comment 1 Notify RN    Comment 2 Document in Chart   Glucose, capillary     Status: None   Collection Time: 06/12/20  7:31 AM  Result Value Ref Range   Glucose-Capillary 73 70 - 99 mg/dL  Glucose, capillary     Status: None   Collection Time: 06/12/20 11:31 AM  Result Value Ref Range   Glucose-Capillary 99 70 - 99 mg/dL    No results found for this or any previous visit (from the past 240 hour(s)).   Radiology Studies: No results found. DG Swallowing Func-Speech Pathology  Final Result    DG Abd Portable 1V  Final Result    DG Chest Port 1 View  Final Result    US Abdomen Limited RUQ (LIVER/GB)  Final Result      CT ABDOMEN PELVIS WO CONTRAST  Final Result    DG Chest Port 1 View  Final Result    IR GASTROSTOMY TUBE MOD SED  Final Result    DG Abd Portable 1V  Final Result    DG Swallowing Func-Speech Pathology  Final Result    CT HEAD W & WO CONTRAST  Final Result    DG CHEST PORT 1 VIEW  Final Result    CT ABDOMEN PELVIS W CONTRAST  Final Result    CT CHEST W CONTRAST  Final Result    DG CHEST PORT 1 VIEW  Final Result    DG CHEST PORT 1 VIEW  Final Result    DG CHEST PORT 1 VIEW  Final Result    CT HEAD WO CONTRAST  Final Result    DG CHEST PORT 1  VIEW  Final Result    CT ANGIO NECK W OR WO CONTRAST  Final Result    CT ANGIO HEAD W OR WO CONTRAST  Final Result    CT HEAD WO CONTRAST  Final Result    CT Head Wo Contrast  Final Result    DG Chest Port 1 View  Final Result    CT HEAD CODE STROKE WO CONTRAST  Final Result      Scheduled Meds: . carvedilol  6.25 mg Per Tube BID WC  . chlorhexidine  15 mL Mouth Rinse BID  . feeding supplement (OSMOLITE 1.5 CAL)  780 mL Per Tube Q24H  . free water  200 mL Per Tube Q4H  . insulin aspart  0-9 Units Subcutaneous Q4H  . insulin glargine  15 Units Subcutaneous Daily  . mouth rinse  15 mL Mouth Rinse q12n4p  . pantoprazole sodium  40 mg Per Tube BID  . polyethylene glycol powder  0.5 Container Per Tube Once  . sennosides  5 mL Per Tube BID  . sodium chloride flush  10-40 mL Intracatheter Q12H   PRN Meds: acetaminophen **OR** acetaminophen (TYLENOL) oral liquid 160 mg/5 mL **OR** acetaminophen, hydrALAZINE, levalbuterol, sodium chloride flush Continuous Infusions:   LOS: 38 days  Time spent: Greater than 50% of the 35 minute visit was spent in counseling/coordination of care for the patient as laid out in the A&P.   Lewie Chamber, MD [facility] 06/12/2020, 3:39 PM

## 2020-06-13 LAB — GLUCOSE, CAPILLARY
Glucose-Capillary: 133 mg/dL — ABNORMAL HIGH (ref 70–99)
Glucose-Capillary: 133 mg/dL — ABNORMAL HIGH (ref 70–99)
Glucose-Capillary: 97 mg/dL (ref 70–99)
Glucose-Capillary: 91 mg/dL (ref 70–99)
Glucose-Capillary: 134 mg/dL — ABNORMAL HIGH (ref 70–99)
Glucose-Capillary: 135 mg/dL — ABNORMAL HIGH (ref 70–99)

## 2020-06-13 NOTE — Progress Notes (Signed)
PROGRESS NOTE    Alan Everett   XAJ:[id number]  DOB: [birth year]  DOA: 05/05/2020     39  PCP: Patient, No Pcp Per  CC: weakness  Hospital Course: 64 y.o. male with a PMHx of alcohol use and tobacco abuse. Patient presented to the ED via EMS on 10/6 as a Code Stroke for acute onset of right sided weakness, sensory loss and aphasia. CT head revealed an enlarging L thalamocapsular IPH w/ increasing IVH from L ventricle into 3rd and 4th ventricles. Neurosurgery was initially consulted for EVD consideration which was then deferred.   UDS positive for cocaine.    Admitted for stroke work-up. Hospital course complicated by pneumonia and recurrent blood loss anemia. Pending placement at this time.   Interval History:  No events overnight. Some days hard to delineate if he is understanding things; some days he follows verbal commands and not others. Today he just said "yes" to everything.   Old records reviewed in assessment of this patient  ROS: Review of systems not obtained due to patient factors. Severe cognitive impairment   Assessment & Plan: Acute Hemorrhagic stroke/cerebral edema -Likely in the setting of severe hypertension, alcohol and cocaine use. -Because of IVH, neurosurgery was consulted. EVD was deferred.   -Subsequent CT scans of the head showed stable findings.   -He is alert and awake but has global aphasia, unable to follow commands. -No change in neurological status in at least last 3 weeks. -Not on antiplatelets.  LDL 80.  Moderate to severe dysphagia -Secondary to stroke.  PEG tube placed on 05/21/2020. -Aspiration precautions  Bibasilar pneumonia -resolved -CT scan 10/12 showed bibasilar pneumonia.   -Treated with broad-spectrum antibiotics.   -WBC normalized.  No longer having fever.  Klebsiella UTI  -resolved -Urine culture report from 10/10 with more than 1000 CFU per mL of Klebsiella pneumoniae.   -Completed 5-day course of IV Rocephin.    Acute on chronic anemia -Total of 6 units of PRBC transfusions given in this admission. -Hemoglobin stable 8-9 g/dL -GI consult appreciated.  Currently endoscopic evaluation is on hold. -Continue PPI twice daily.  Sinus tachycardia Essential hypertension -Heart rate and blood pressure both are stable on Coreg 3.125 mg daily.  Hypernatremia - resolved   Elevated liver enzymes -Right upper quadrant ultrasound-cholelithiasis without acute cholecystitis -Chronic mild elevation of liver enzymes persist.  Tobacco Abuse -Counseled to quit using this -Given current neurologic status, would be unable to continue any further substance abuse at this point  Cocaine abuse -Counseled to quit using this -Given current neurologic status, would be unable to continue any further substance abuse at this point  GERD -PPI twice daily  Goals of care -Poor neurological recovery so far.  Poor expectation of further neurological recovery -Palliative care was consulted in the past.  Patient remains full code.    Antimicrobials: None further   DVT prophylaxis: SCD Code Status: Full Family Communication: none present Disposition Plan: Status is: Inpatient  Remains inpatient appropriate because:Unsafe d/c plan and no insurance and placement options limited; awaiting placement   Dispo: The patient is from: Home              Anticipated d/c is to: SNF              Anticipated d/c date is: > 3 days. Placement difficult due to lack of insurance and cocaine positive on admission although he will not be able to continue with substance abuse anyways given severe neurologic deficits.  Other barrier  appears to be lack of insurance              Patient currently is medically stable to d/c.   Objective: Blood pressure 124/73, pulse 84, temperature 98.6 F (37 C), temperature source Oral, resp. rate 18, weight 60.1 kg, SpO2 100 %.  Examination: General appearance: resting in bed comfortable;  not following any commands this am Head: Normocephalic, without obvious abnormality, atraumatic Eyes: EOMI Lungs: clear to auscultation bilaterally Heart: regular rate and rhythm and S1, S2 normal Abdomen: soft, NT, ND, BS present; PEG in palce Extremities: no edema Skin: xerosis Neurologic: no movement with right side; moves LUE spontaneously  Consultants:   Neuro  GI  Procedures:   none  Data Reviewed: I have personally reviewed following labs and imaging studies Results for orders placed or performed during the hospital encounter of 05/05/20 (from the past 24 hour(s))  Glucose, capillary     Status: None   Collection Time: 06/12/20  3:53 PM  Result Value Ref Range   Glucose-Capillary 95 70 - 99 mg/dL  Glucose, capillary     Status: Abnormal   Collection Time: 06/12/20  8:36 PM  Result Value Ref Range   Glucose-Capillary 110 (H) 70 - 99 mg/dL  Glucose, capillary     Status: None   Collection Time: 06/12/20 11:57 PM  Result Value Ref Range   Glucose-Capillary 86 70 - 99 mg/dL   Comment 1 Notify RN    Comment 2 Document in Chart   Glucose, capillary     Status: Abnormal   Collection Time: 06/13/20  3:33 AM  Result Value Ref Range   Glucose-Capillary 133 (H) 70 - 99 mg/dL   Comment 1 Notify RN    Comment 2 Document in Chart   Glucose, capillary     Status: Abnormal   Collection Time: 06/13/20  6:05 AM  Result Value Ref Range   Glucose-Capillary 133 (H) 70 - 99 mg/dL  Glucose, capillary     Status: None   Collection Time: 06/13/20 11:45 AM  Result Value Ref Range   Glucose-Capillary 97 70 - 99 mg/dL    No results found for this or any previous visit (from the past 240 hour(s)).   Radiology Studies: No results found. DG Swallowing Func-Speech Pathology  Final Result    DG Abd Portable 1V  Final Result    DG Chest Port 1 View  Final Result    US Abdomen Limited RUQ (LIVER/GB)  Final Result    CT ABDOMEN PELVIS WO CONTRAST  Final Result    DG Chest  Port 1 View  Final Result    IR GASTROSTOMY TUBE MOD SED  Final Result    DG Abd Portable 1V  Final Result    DG Swallowing Func-Speech Pathology  Final Result    CT HEAD W & WO CONTRAST  Final Result    DG CHEST PORT 1 VIEW  Final Result    CT ABDOMEN PELVIS W CONTRAST  Final Result    CT CHEST W CONTRAST  Final Result    DG CHEST PORT 1 VIEW  Final Result    DG CHEST PORT 1 VIEW  Final Result    DG CHEST PORT 1 VIEW  Final Result    CT HEAD WO CONTRAST  Final Result    DG CHEST PORT 1 VIEW  Final Result    CT ANGIO NECK W OR WO CONTRAST  Final Result    CT ANGIO HEAD W OR WO  CONTRAST  Final Result    CT HEAD WO CONTRAST  Final Result    CT Head Wo Contrast  Final Result    DG Chest Port 1 View  Final Result    CT HEAD CODE STROKE WO CONTRAST  Final Result      Scheduled Meds: . carvedilol  6.25 mg Per Tube BID WC  . chlorhexidine  15 mL Mouth Rinse BID  . feeding supplement (OSMOLITE 1.5 CAL)  780 mL Per Tube Q24H  . free water  200 mL Per Tube Q4H  . insulin aspart  0-9 Units Subcutaneous Q4H  . insulin glargine  15 Units Subcutaneous Daily  . mouth rinse  15 mL Mouth Rinse q12n4p  . pantoprazole sodium  40 mg Per Tube BID  . polyethylene glycol powder  0.5 Container Per Tube Once  . sennosides  5 mL Per Tube BID  . sodium chloride flush  10-40 mL Intracatheter Q12H   PRN Meds: acetaminophen **OR** acetaminophen (TYLENOL) oral liquid 160 mg/5 mL **OR** acetaminophen, hydrALAZINE, levalbuterol, sodium chloride flush Continuous Infusions:   LOS: 39 days  Time spent: Greater than 50% of the 35 minute visit was spent in counseling/coordination of care for the patient as laid out in the A&P.   Lewie Chamber, MD [facility] 06/13/2020, 2:23 PM

## 2020-06-14 ENCOUNTER — Inpatient Hospital Stay: Payer: MEDICAID

## 2020-06-14 ENCOUNTER — Inpatient Hospital Stay: Payer: MEDICAID | Admitting: Internal Medicine

## 2020-06-14 LAB — GLUCOSE, CAPILLARY
Glucose-Capillary: 123 mg/dL — ABNORMAL HIGH (ref 70–99)
Glucose-Capillary: 127 mg/dL — ABNORMAL HIGH (ref 70–99)
Glucose-Capillary: 107 mg/dL — ABNORMAL HIGH (ref 70–99)
Glucose-Capillary: 131 mg/dL — ABNORMAL HIGH (ref 70–99)
Glucose-Capillary: 90 mg/dL (ref 70–99)
Glucose-Capillary: 115 mg/dL — ABNORMAL HIGH (ref 70–99)

## 2020-06-14 MED ORDER — PANCRELIPASE (LIP-PROT-AMYL) 10440-39150 UNITS PO TABS
20880.0000 [IU] | ORAL_TABLET | Freq: Once | ORAL | Status: AC
  Administered 2020-06-14: 20880 [IU]
  Filled 2020-06-14: qty 2

## 2020-06-14 MED ORDER — SODIUM BICARBONATE 650 MG PO TABS
650.0000 mg | ORAL_TABLET | Freq: Once | ORAL | Status: AC
  Administered 2020-06-14: 650 mg
  Filled 2020-06-14: qty 1

## 2020-06-14 NOTE — Progress Notes (Signed)
Physical Therapy Treatment Patient Details Name: Alan Everett MRN: [id number] DOB: Jul 30, 1956 Today's Date: 06/14/2020    History of Present Illness Patient is a 64 y/o male with PMH ETOH use, tobacco abuse, admitted with R side weakness,sensory loss and speech deficits.  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    PT Comments    Pt soiled in stool upon arrival to room, required +2 assist for pericare and pt resistant to rolling initially. Pt overall required max assist for mobility tasks today, tolerating repeated standing trials with max assist and RLE blocking. Pt demonstrating increasing R hamstring, heel cord, and biceps tightness this day, addressed with sustained hold at end range during PROM exercises. Will continue to follow acutely.     Follow Up Recommendations  SNF;Supervision/Assistance - 24 hour     Equipment Recommendations  Wheelchair cushion (measurements PT);Wheelchair (measurements PT);Hospital bed;Other (comment)    Recommendations for Other Services       Precautions / Restrictions Precautions Precautions: Fall Precaution Comments: L handmitt, G tube, no functional use of RUE; R side neglect    Mobility  Bed Mobility Overal bed mobility: Needs Assistance Bed Mobility: Supine to Sit;Sit to Supine Rolling: Max assist   Supine to sit: Max assist;HOB elevated Sit to supine: Max assist;HOB elevated   General bed mobility comments: max assist for rolling bilaterally during pericare, pt resistant to roll towards R x2. Max assist for to and from EOB for trunk and LE management, scooting to and from EOB.  Transfers Overall transfer level: Needs assistance Equipment used: 1 person hand held assist Transfers: Sit to/from Stand Sit to Stand: Max assist         General transfer comment: max assist for power up, R knee blocking, and steadying. Verbal cuing for upright posture, bringing LUE off of  bedrails. standing trials x3.  Ambulation/Gait                 Stairs             Wheelchair Mobility    Modified Rankin (Stroke Patients Only) Modified Rankin (Stroke Patients Only) Pre-Morbid Rankin Score: No symptoms Modified Rankin: Severe disability     Balance Overall balance assessment: Needs assistance Sitting-balance support: Single extremity supported;Feet supported Sitting balance-Leahy Scale: Poor Sitting balance - Comments: fair to poor, periods of min guard for ~15 seconds before fatiguing   Standing balance support: Single extremity supported Standing balance-Leahy Scale: Zero Standing balance comment: max assist to stand                            Cognition Arousal/Alertness: Awake/alert Behavior During Therapy: Flat affect Overall Cognitive Status: Difficult to assess                                 General Comments: pt states "I shi* myself" upon PT arrival to room, then states "I am going" as in leaving hospital. Follows one-step commands inconsistently during session      Exercises General Exercises - Lower Extremity Ankle Circles/Pumps: PROM;Right;10 reps;Supine Quad Sets: PROM;Right;10 reps;Supine (with emphasis on knee extension to neutral, limited by pt discomfort) Long Arc Quad: AAROM;Left;10 reps;Seated Shoulder Exercises Elbow Flexion: PROM;Right;10 reps;Supine Elbow Extension: PROM;Right;10 reps;Supine    General Comments        Pertinent Vitals/Pain Pain Assessment: Faces Faces Pain Scale: Hurts little  more Pain Location: R hip, with hip IR and knee extension to neutral Pain Descriptors / Indicators: Grimacing;Discomfort Pain Intervention(s): Limited activity within patient's tolerance;Monitored during session;Repositioned    Home Living                      Prior Function            PT Goals (current goals can now be found in the care plan section) Acute Rehab PT Goals PT Goal  Formulation: Patient unable to participate in goal setting Time For Goal Achievement: 06/17/20 Potential to Achieve Goals: Fair Progress towards PT goals: Progressing toward goals    Frequency    Min 3X/week      PT Plan Current plan remains appropriate    Co-evaluation              AM-PAC PT "6 Clicks" Mobility   Outcome Measure  Help needed turning from your back to your side while in a flat bed without using bedrails?: Total Help needed moving from lying on your back to sitting on the side of a flat bed without using bedrails?: Total Help needed moving to and from a bed to a chair (including a wheelchair)?: Total Help needed standing up from a chair using your arms (e.g., wheelchair or bedside chair)?: Total Help needed to walk in hospital room?: Total Help needed climbing 3-5 steps with a railing? : Total 6 Click Score: 6    End of Session   Activity Tolerance: Patient tolerated treatment well Patient left: in bed;with call bell/phone within reach;with bed alarm set;with restraints reapplied Nurse Communication: Mobility status PT Visit Diagnosis: Unsteadiness on feet (R26.81);Muscle weakness (generalized) (M62.81);Difficulty in walking, not elsewhere classified (R26.2);Other symptoms and signs involving the nervous system (R29.898);Hemiplegia and hemiparesis Hemiplegia - Right/Left: Right Hemiplegia - dominant/non-dominant: Dominant Hemiplegia - caused by: Nontraumatic intracerebral hemorrhage     Time: 5993-5701 PT Time Calculation (min) (ACUTE ONLY): 28 min  Charges:  $Therapeutic Exercise: 8-22 mins $Therapeutic Activity: 8-22 mins                     Corrin Hingle E, PT Acute Rehabilitation Services Pager [phone]  Office [phone]   Tyrone Apple D Despina Hidden 06/14/2020, 5:27 PM

## 2020-06-14 NOTE — Plan of Care (Signed)
  Problem: Elimination: Goal: Will not experience complications related to bowel motility Outcome: Progressing Goal: Will not experience complications related to urinary retention Outcome: Progressing   Problem: Elimination: Goal: Will not experience complications related to bowel motility Outcome: Progressing Goal: Will not experience complications related to urinary retention Outcome: Progressing   Problem: Coping: Goal: Level of anxiety will decrease Outcome: Progressing   Problem: Intracerebral Hemorrhage Tissue Perfusion: Goal: Complications of Intracerebral Hemorrhage will be minimized Outcome: Progressing

## 2020-06-14 NOTE — Progress Notes (Signed)
PROGRESS NOTE    Alan Everett   JSH:[id number]  DOB: Sep 11, 1955  DOA: 05/05/2020     40  PCP: Patient, No Pcp Per  CC: weakness  Hospital Course: 64 y.o. male with a PMHx of alcohol use and tobacco abuse. Patient presented to the ED via EMS on 10/6 as a Code Stroke for acute onset of right sided weakness, sensory loss and aphasia. CT head revealed an enlarging L thalamocapsular IPH w/ increasing IVH from L ventricle into 3rd and 4th ventricles. Neurosurgery was initially consulted for EVD consideration which was then deferred.   UDS positive for cocaine.    Admitted for stroke work-up. Hospital course complicated by pneumonia and recurrent blood loss anemia. Pending placement at this time.   Interval History:  No events overnight. Some days hard to delineate if he is understanding things; some days he follows verbal commands and not others.  This morning he is mumbling so fast it was hard to understand. Finally got him to slow down and all he said was "I'm leaving". He could not elaborate or anything which wasn't unexpected given the severity of his deficits.   His PEG tube had also become clogged and was not relieved with warm water flushes.  Old records reviewed in assessment of this patient  ROS: Review of systems not obtained due to patient factors. Severe cognitive impairment   Assessment & Plan: Acute Hemorrhagic stroke/cerebral edema -Likely in the setting of severe hypertension, alcohol and cocaine use. -Because of IVH, neurosurgery was consulted. EVD was deferred.   -Subsequent CT scans of the head showed stable findings.   -He is alert and awake but has global aphasia, unable to follow commands. No change in neurological status in at least last 3 weeks. -Not on antiplatelets.  LDL 80. -Barrier to discharge has been lack of insurance as well as use of cocaine on admission (however given his poor neurologic status, he would not be able to further abuse drugs going  forward anyways)  Moderate to severe dysphagia -Secondary to stroke.  PEG tube placed on 05/21/2020. -Aspiration precautions - declog PEG protocol ordered on 11/15  Bibasilar pneumonia -resolved -CT scan 10/12 showed bibasilar pneumonia.   -Treated with broad-spectrum antibiotics.   -WBC normalized.  No longer having fever.  Klebsiella UTI  -resolved -Urine culture report from 10/10 with more than 1000 CFU per mL of Klebsiella pneumoniae.   -Completed 5-day course of IV Rocephin.   Acute on chronic anemia -Total of 6 units of PRBC transfusions given in this admission. -Hemoglobin stable 8-9 g/dL -GI consult appreciated.  Currently endoscopic evaluation is on hold. -Continue PPI twice daily.  Sinus tachycardia Essential hypertension -Heart rate and blood pressure both are stable on Coreg 3.125 mg daily.  Hypernatremia - resolved   Elevated liver enzymes -Right upper quadrant ultrasound-cholelithiasis without acute cholecystitis -Chronic mild elevation of liver enzymes persist.  Tobacco Abuse -Counseled to quit using this -Given current neurologic status, would be unable to continue any further substance abuse at this point  Cocaine abuse -Counseled to quit using this -Given current neurologic status, would be unable to continue any further substance abuse at this point  GERD -PPI twice daily  Goals of care -Poor neurological recovery so far.  Poor expectation of further neurological recovery -Palliative care was consulted in the past.  Patient remains full code.    Antimicrobials: None further   DVT prophylaxis: SCD Code Status: Full Family Communication: none present Disposition Plan: Status is: Inpatient  Remains  inpatient appropriate because:Unsafe d/c plan and no insurance and placement options limited; awaiting placement   Dispo: The patient is from: Home              Anticipated d/c is to: SNF              Anticipated d/c date is: > 3 days.  Placement difficult due to lack of insurance and cocaine positive on admission although he will not be able to continue with substance abuse anyways given severe neurologic deficits.  Other barrier appears to be lack of insurance              Patient currently is medically stable to d/c.   Objective: Blood pressure 128/75, pulse 96, temperature 98.9 F (37.2 C), temperature source Oral, resp. rate 14, weight 58.5 kg, SpO2 100 %.  Examination: General appearance: resting in bed comfortable; not following any commands this am Head: Normocephalic, without obvious abnormality, atraumatic Eyes: EOMI Lungs: clear to auscultation bilaterally Heart: regular rate and rhythm and S1, S2 normal Abdomen: soft, NT, ND, BS present; PEG in palce Extremities: no edema Skin: xerosis Neurologic: no movement with right side; moves LUE spontaneously  Consultants:   Neuro  GI  Procedures:   none  Data Reviewed: I have personally reviewed following labs and imaging studies Results for orders placed or performed during the hospital encounter of 05/05/20 (from the past 24 hour(s))  Glucose, capillary     Status: None   Collection Time: 06/13/20 11:45 AM  Result Value Ref Range   Glucose-Capillary 97 70 - 99 mg/dL  Glucose, capillary     Status: None   Collection Time: 06/13/20  4:13 PM  Result Value Ref Range   Glucose-Capillary 91 70 - 99 mg/dL  Glucose, capillary     Status: Abnormal   Collection Time: 06/13/20  8:19 PM  Result Value Ref Range   Glucose-Capillary 134 (H) 70 - 99 mg/dL  Glucose, capillary     Status: Abnormal   Collection Time: 06/13/20 11:17 PM  Result Value Ref Range   Glucose-Capillary 135 (H) 70 - 99 mg/dL   Comment 1 Notify RN    Comment 2 Document in Chart   Glucose, capillary     Status: Abnormal   Collection Time: 06/14/20  4:12 AM  Result Value Ref Range   Glucose-Capillary 123 (H) 70 - 99 mg/dL   Comment 1 Notify RN    Comment 2 Document in Chart   Glucose,  capillary     Status: Abnormal   Collection Time: 06/14/20  7:23 AM  Result Value Ref Range   Glucose-Capillary 127 (H) 70 - 99 mg/dL    No results found for this or any previous visit (from the past 240 hour(s)).   Radiology Studies: No results found. DG Swallowing Func-Speech Pathology  Final Result    DG Abd Portable 1V  Final Result    DG Chest Port 1 View  Final Result    US Abdomen Limited RUQ (LIVER/GB)  Final Result    CT ABDOMEN PELVIS WO CONTRAST  Final Result    DG Chest Port 1 View  Final Result    IR GASTROSTOMY TUBE MOD SED  Final Result    DG Abd Portable 1V  Final Result    DG Swallowing Func-Speech Pathology  Final Result    CT HEAD W & WO CONTRAST  Final Result    DG CHEST PORT 1 VIEW  Final Result    CT ABDOMEN  PELVIS W CONTRAST  Final Result    CT CHEST W CONTRAST  Final Result    DG CHEST PORT 1 VIEW  Final Result    DG CHEST PORT 1 VIEW  Final Result    DG CHEST PORT 1 VIEW  Final Result    CT HEAD WO CONTRAST  Final Result    DG CHEST PORT 1 VIEW  Final Result    CT ANGIO NECK W OR WO CONTRAST  Final Result    CT ANGIO HEAD W OR WO CONTRAST  Final Result    CT HEAD WO CONTRAST  Final Result    CT Head Wo Contrast  Final Result    DG Chest Port 1 View  Final Result    CT HEAD CODE STROKE WO CONTRAST  Final Result      Scheduled Meds: . carvedilol  6.25 mg Per Tube BID WC  . chlorhexidine  15 mL Mouth Rinse BID  . feeding supplement (OSMOLITE 1.5 CAL)  780 mL Per Tube Q24H  . free water  200 mL Per Tube Q4H  . insulin aspart  0-9 Units Subcutaneous Q4H  . insulin glargine  15 Units Subcutaneous Daily  . lipase/protease/amylase)  20,880 Units Per Tube Once   And  . sodium bicarbonate  650 mg Per Tube Once  . mouth rinse  15 mL Mouth Rinse q12n4p  . pantoprazole sodium  40 mg Per Tube BID  . polyethylene glycol powder  0.5 Container Per Tube Once  . sennosides  5 mL Per Tube BID  . sodium chloride  flush  10-40 mL Intracatheter Q12H   PRN Meds: acetaminophen **OR** acetaminophen (TYLENOL) oral liquid 160 mg/5 mL **OR** acetaminophen, hydrALAZINE, levalbuterol, sodium chloride flush Continuous Infusions:   LOS: 40 days  Time spent: Greater than 50% of the 35 minute visit was spent in counseling/coordination of care for the patient as laid out in the A&P.   Lewie Chamber, MD [facility] 06/14/2020, 11:16 AM

## 2020-06-15 LAB — GLUCOSE, CAPILLARY
Glucose-Capillary: 126 mg/dL — ABNORMAL HIGH (ref 70–99)
Glucose-Capillary: 149 mg/dL — ABNORMAL HIGH (ref 70–99)
Glucose-Capillary: 100 mg/dL — ABNORMAL HIGH (ref 70–99)
Glucose-Capillary: 129 mg/dL — ABNORMAL HIGH (ref 70–99)
Glucose-Capillary: 126 mg/dL — ABNORMAL HIGH (ref 70–99)
Glucose-Capillary: 123 mg/dL — ABNORMAL HIGH (ref 70–99)

## 2020-06-15 NOTE — Progress Notes (Signed)
Occupational Therapy Treatment Patient Details Name: Alan Everett MRN: [id number] DOB: 1955/12/17 Today's Date: 06/15/2020    History of present illness Patient is a 64 y/o male with PMH ETOH use, tobacco abuse, admitted with R side weakness,sensory loss and speech deficits.  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   OT comments  Pt seen in conjunction with PT to maximize pts activity tolerance. Pt continues to present with R sided weakness/ neglect ( pt unable to track past midline but did spontaneously respond to stimulus on pts R side), impaired balance and decreased activity tolerance impacting pts ability to complete BADLs. Worked on sitting balance with pt needing up to MAX A for static sitting balance however pt did have moments of close min guard with LUE supported on end of bed however pt noted to push heavily R and posteriorly. Pt currently requires MAX A +2 for all aspects of mobility. Pt able to complete x4 sit<>stands from stedy. Pt requires assist  to elevate trunk ( verbal and tactile cues were helpful)  and assist to bring hips into full extension while standing. Pt also requries assist to manage RUE during sit<>stands. Worked on self ROM with pt demonstrating good carryover. Pt making conversational this session, however pt continues to be aphasic. Pt would continue to benefit from skilled occupational therapy while admitted and after d/c to address the below listed limitations in order to improve overall functional mobility and facilitate independence with BADL participation. DC plan remains appropriate, will follow acutely per POC.    Follow Up Recommendations  SNF    Equipment Recommendations  Wheelchair (measurements OT);Wheelchair cushion (measurements OT);Hospital bed    Recommendations for Other Services      Precautions / Restrictions Precautions Precautions: Fall Precaution Comments: L handmitt, G tube,  no functional use of RUE; R side neglect Restrictions Weight Bearing Restrictions: No       Mobility Bed Mobility   Bed Mobility: Supine to Sit;Sit to Supine     Supine to sit: Max assist;+2 for physical assistance;HOB elevated Sit to supine: Max assist;+2 for physical assistance;HOB elevated   General bed mobility comments: pt required MAX A +2 to progress OOB to pts R side. pt initiating with LLE to but required assist to maneuver RLE and elevate trunk into sitting. MAX A +2 to lower pts trunk back to supine and elevate BLEs  Transfers Overall transfer level: Needs assistance Equipment used: Ambulation equipment used Transfers: Sit to/from Stand Sit to Stand: +2 physical assistance;Max assist         General transfer comment: pt complete x4 sit<>stands from EOB and seat of stedy with pt needing MAX A +2. pt requires assist  to elevate trunk ( verbal and tactile cues were helpful)  and assist to bring hips into full extension while standing. pt also requries assist to manage RUE during sit<>stands    Balance Overall balance assessment: Needs assistance Sitting-balance support: Single extremity supported;Feet supported Sitting balance-Leahy Scale: Poor Sitting balance - Comments: fair to poor, periods of min guard for ~15 seconds before fatiguing Postural control: Right lateral lean;Posterior lean (R and posterior lean at times) Standing balance support: Single extremity supported Standing balance-Leahy Scale: Poor Standing balance comment: reliant on external assist to maintain upright posture                           ADL either performed or assessed with clinical judgement  ADL Overall ADL's : Needs assistance/impaired     Grooming: Wash/dry face;Total assistance;Sitting Grooming Details (indicate cue type and reason): total A from sitting EOB                 Toilet Transfer: Maximal assistance;+2 for physical assistance Toilet Transfer Details  (indicate cue type and reason): sit<>stand to stedy only x4         Functional mobility during ADLs: Maximal assistance;+2 for physical assistance (sit<>stand to stedy and bed mobility) General ADL Comments: session focus on functional sit<>stands in stedy, sitting balance and self ROM of RUE. pt continues to present with decreased activity tolerance, R sided negelct and R sided weakness     Vision Patient Visual Report: Other (comment) (R sided neglect) Vision Assessment?: Vision impaired- to be further tested in functional context Additional Comments: pt able to track to midline this session and noted improvement on responding to stimulus on R side   Perception     Praxis      Cognition Arousal/Alertness: Awake/alert Behavior During Therapy: WFL for tasks assessed/performed (much more talkative this session) Overall Cognitive Status: Impaired/Different from baseline Area of Impairment: Following commands;Safety/judgement;Awareness;Problem solving                       Following Commands: Follows one step commands with increased time Safety/Judgement: Decreased awareness of safety;Decreased awareness of deficits Awareness: Emergent Problem Solving: Slow processing;Requires verbal cues;Requires tactile cues General Comments: pt much more conversational this session, pt still aphasic but attempting more purposefully speech this session. Pt overall slow to process but benefits from verbal and tactile cues in relation to following commands and attending to a task        Exercises General Exercises - Upper Extremity Shoulder Flexion: Self ROM;Right;5 reps;Supine;Limitations Shoulder Flexion Limitations: limited to 90* Wrist Flexion: PROM;Right;5 reps Wrist Extension: PROM;Strengthening;5 reps   Shoulder Instructions       General Comments      Pertinent Vitals/ Pain       Pain Assessment: Faces Faces Pain Scale: Hurts little more Pain Location: RLE with ROM Pain  Descriptors / Indicators: Grimacing;Discomfort Pain Intervention(s): Monitored during session;Repositioned  Home Living                                          Prior Functioning/Environment              Frequency  Min 2X/week        Progress Toward Goals  OT Goals(current goals can now be found in the care plan section)  Progress towards OT goals: Progressing toward goals  Acute Rehab OT Goals Patient Stated Goal: PT unable to comprehend pt's garbled speech OT Goal Formulation: Patient unable to participate in goal setting Time For Goal Achievement: 06/25/20 Potential to Achieve Goals: Fair  Plan Discharge plan remains appropriate;Frequency remains appropriate    Co-evaluation    PT/OT/SLP Co-Evaluation/Treatment: Yes Reason for Co-Treatment: Complexity of the patient's impairments (multi-system involvement);For patient/therapist safety;To address functional/ADL transfers;Necessary to address cognition/behavior during functional activity   OT goals addressed during session: ADL's and self-care      AM-PAC OT "6 Clicks" Daily Activity     Outcome Measure   Help from another person eating meals?: Total Help from another person taking care of personal grooming?: A Lot Help from another person toileting, which includes using toliet, bedpan, or  urinal?: Total Help from another person bathing (including washing, rinsing, drying)?: Total Help from another person to put on and taking off regular upper body clothing?: Total Help from another person to put on and taking off regular lower body clothing?: Total 6 Click Score: 7    End of Session Equipment Utilized During Treatment: Gait belt;Other (comment) (stedy)  OT Visit Diagnosis: Other abnormalities of gait and mobility (R26.89);Muscle weakness (generalized) (M62.81);Low vision, both eyes (H54.2);Other symptoms and signs involving cognitive function;Hemiplegia and hemiparesis Hemiplegia -  Right/Left: Right Hemiplegia - dominant/non-dominant: Dominant   Activity Tolerance Patient tolerated treatment well   Patient Left in bed;with call bell/phone within reach;with bed alarm set   Nurse Communication Mobility status;Other (comment) (needs to wear elbow splint at night on RUE)        Time: 6389-3734 OT Time Calculation (min): 29 min  Charges: OT General Charges $OT Visit: 1 Visit OT Treatments $Therapeutic Activity: 8-22 mins  Audery Amel., COTA/L Acute Rehabilitation Services [phone]    Angelina Pih 06/15/2020, 4:44 PM

## 2020-06-15 NOTE — Progress Notes (Addendum)
[facility] PROGRESS NOTE  Pierson Vantol TDV:[id number] DOB: [birth year] DOA: 05/05/2020 PCP: Patient, No Pcp Per    11/16   Status: Remains inpatient appropriate because:Altered mental status and Unsafe d/c plan   Dispo: The patient is from: Home              Anticipated d/c is to: SNF              Anticipated d/c date is: > 3 days              Patient currently is medically stable to d/c.  No expectation of full recovery from neurological injury and suspect patient will require long-term skilled nursing care at a facility    Code Status: Full Family Communication: Patient only-no family at bedside DVT prophylaxis: SCDs only-presented with intraventricular hemorrhage resulting in neurological injury Vaccination status: We will need to discuss with family if patient has previously received Covid vaccination  Foley catheter: No, external urinary device only  HPI: 64 year old male patient with history of iron deficiency anemia, alcohol use, cocaine use and tobacco abuse.  Patient presented to the ER by EMS on 10/6 as a code stroke.  At that time he was noted to have acute onset right-sided weakness, sensory loss and aphasia.  CT of the head revealed an enlarging left thalamic capsular intraparenchymal hemorrhage with increasing intraventricular hemorrhage from left ventricle into the third and fourth ventricles.  Neurosurgery consulted but no indication at that time for EVD.  Urine drug screen was found to be positive for cocaine.  Since admission hospital course has been complicated by pneumonia and recurrent blood loss anemia.  It has been noted that in the outpatient setting he had undergone EGD and colonoscopy on 8/2 at [facility] in [place].  No significant abnormalities identified and patient was recommended to continue previous iron.  He continues to have issues regarding communication as evidenced by global aphasia.  He also has moderate to severe dysphagia and is  requiring feedings via a PEG tube.  Subjective: Awake.  Will attempt to answer questions when spoken to but answers are inappropriate to conversation at hand.   Objective: Vitals:   06/15/20 0738 06/15/20 1143  BP: 124/73 (P) 125/74  Pulse: 93 (P) 96  Resp: 18 (P) 18  Temp: 98.6 F (37 C) (P) 98.3 F (36.8 C)  SpO2: 100% (P) 100%    Intake/Output Summary (Last 24 hours) at 06/15/2020 1205 Last data filed at 06/15/2020 0900 Gross per 24 hour  Intake 220 ml  Output 1400 ml  Net -1180 ml   Filed Weights   06/12/20 0500 06/14/20 0441 06/15/20 0407  Weight: 60.1 kg 58.5 kg 59.7 kg    Exam:  Constitutional: NAD, calm, comfortable Respiratory: clear to auscultation bilaterally,Normal respiratory effort. No accessory muscle use. RA Cardiovascular: Regular rate and rhythm, No extremity edema. 2+ pedal pulses. Abdomen: no tenderness, no masses palpated.  Bowel sounds positive.  PEG tube Musculoskeletal: Early contractures RUE-keeps same extremity flexed up on chest-appears to be developing early contracture RLE noting keeps leg flexed at hip.  Skin: no rashes, lesions, No induration.  Did not examine buttock decubitus today Neurologic: CN 2-12 appears to be grossly intact except for notable global aphasia. Sensation intact, DTR normal. Strength 4/5 left side-right side with neglect and apparent paralysis Psychiatric: Alert, responds to examiner in room to name.  Given severe dysarthria/global aphasia unable to adequately assess mentation   Assessment/Plan: Acute problems: Acute Hemorrhagic stroke/cerebral edema -Likely in the  setting of severe hypertension, alcohol and cocaine use. -Because of IVH, neurosurgery was consulted. EVD was deferred.  -Subsequent CT scans of the head showed stable findings.  -He is alert and awake but has global aphasia, unable to follow commands. No change in neurological status in at least last 3 weeks. -Not on antiplatelets. LDL 80. -Barrier to  discharge has been lack of insurance as well as use of cocaine on admission (however given his poor neurologic status, he would not be able to further abuse drugs going forward anyways)  Moderate to severe dysphagia 2/2 stroke/severe protein calorie malnutrition -Secondary to stroke. PEG tube placed on 05/21/2020. Nutrition Problem: Severe Malnutrition Etiology: social / environmental circumstances Signs/Symptoms: severe fat depletion, severe muscle depletion Interventions: Tube feeding Estimated body mass index is 21.24 kg/m as calculated from the following:   Height as of 04/09/20: 5\' 6"  (1.676 m).   Weight as of this encounter: 59.7 kg.  Acute on chronic anemia -Total of 6 units of PRBC transfusions given in this admission. -Hemoglobin stable 8-9 g/dL -GI consult appreciated. Currently endoscopic evaluation is on hold.  -Patient did have endoscopy 03/01/2020 as an outpatient with the following results: Colonoscopy-sigmoid diverticulosis EGD-normal esophagus, gastritis, normal duodenum and no other abnormal findings -Continue PPI twice daily. (See below regarding history of gastritis on recent EGD)  Goals of care -Poor neurological recovery so far.  Do not expect he will return to baseline level of functioning and likely will require long-term skilled nursing care. -Palliative care was consulted in the past. Patient remains full code.   Stage II buttock decubitus Wound / Incision (Open or Dehisced) 05/21/20 Puncture Abdomen Left;Anterior;Upper G-tube insertion site  (Active)  Date First Assessed/Time First Assessed: 05/21/20 1533   Wound Type: Puncture  Location: Abdomen  Location Orientation: Left;Anterior;Upper  Wound Description (Comments): G-tube insertion site   Present on Admission: No    Assessments 05/21/2020  3:34 PM 06/14/2020  7:48 PM  Dressing Type Gauze (Comment);Tape dressing Gauze (Comment)  Dressing Changed New New  Dressing Status Clean;Dry;Intact  Clean;Dry;Intact  Dressing Change Frequency PRN --  Site / Wound Assessment Clean;Dry Clean;Dry  Peri-wound Assessment Intact --  Closure None --  Drainage Amount None --  Treatment Cleansed Cleansed     No Linked orders to display     Pressure Injury 06/02/20 Buttocks Right Stage 2 -  Partial thickness loss of dermis presenting as a shallow open injury with a red, pink wound bed without slough. 7.5 cm in lenth by 4.5 width  (Active)  Date First Assessed/Time First Assessed: 06/02/20 0800   Location: Buttocks  Location Orientation: Right  Staging: Stage 2 -  Partial thickness loss of dermis presenting as a shallow open injury with a red, pink wound bed without slough.  Wound Descri...    Assessments 06/02/2020  8:00 AM 06/14/2020  7:48 PM  Dressing Type -- Foam - Lift dressing to assess site every shift  Dressing -- Reinforced;Clean;Dry  Dressing Change Frequency -- PRN  Peri-wound Assessment -- Intact  Wound Length (cm) 7.5 cm --  Wound Width (cm) 4.5 cm --  Wound Surface Area (cm^2) 33.75 cm^2 --  Drainage Amount None --  Treatment Other (Comment) Other (Comment)     No Linked orders to display      Other problems: Bibasilar pneumonia -resolved -CT scan 10/12 showed bibasilar pneumonia.  -Treated with broad-spectrum antibiotics.  -WBC normalized. No longer having fever.  Klebsiella UTI  -resolved -Urine culture report from 10/10 with more than  1000 CFU per mL of Klebsiella pneumoniae.  -Completed 5-day course of IV Rocephin.   Sinus tachycardia Essential hypertension/grade 1 diastolic dysfunction grade 1 diastolic dysfunction -Heart rate and blood pressure both are stable on Coreg 3.125 mg daily. -Echocardiogram this admission revealed preserved LVEF with physiologic findings consistent with diastolic dysfunction  Hypernatremia  - resolved   Elevated liver enzymes -Right upper quadrant ultrasound-cholelithiasis without acute cholecystitis -Chronic mild  elevation of liver enzymes persist.  Tobacco Abuse -Cessation counseling this admission deferred in the context of abnormal neurological status and patient unable to participate  Cocaine abuse -Cessation counseling this admission deferred in the context of abnormal neurological status and patient unable to participate  GERD/recent gastritis on endoscopy August 2021 -PPI twice daily  Data Reviewed: Basic Metabolic Panel: Recent Labs  Lab 06/09/20 0337  NA 133*  K 3.6  CL 102  CO2 25  GLUCOSE 141*  BUN 14  CREATININE 0.61  CALCIUM 8.2*  MG 1.7  PHOS 3.1   Liver Function Tests: No results for input(s): AST, ALT, ALKPHOS, BILITOT, PROT, ALBUMIN in the last 168 hours. No results for input(s): LIPASE, AMYLASE in the last 168 hours. No results for input(s): AMMONIA in the last 168 hours. CBC: Recent Labs  Lab 06/09/20 0337  WBC 8.8  NEUTROABS 5.7  HGB 8.1*  HCT 26.5*  MCV 100.0  PLT 388   Cardiac Enzymes: No results for input(s): CKTOTAL, CKMB, CKMBINDEX, TROPONINI in the last 168 hours. BNP (last 3 results) Recent Labs    05/29/20 0409  BNP 23.2    ProBNP (last 3 results) No results for input(s): PROBNP in the last 8760 hours.  CBG: Recent Labs  Lab 06/14/20 2002 06/14/20 2323 06/15/20 0323 06/15/20 0754 06/15/20 1142  GLUCAP 90 115* 126* 149* 100*    No results found for this or any previous visit (from the past 240 hour(s)).   Studies: No results found.  Scheduled Meds: . carvedilol  6.25 mg Per Tube BID WC  . chlorhexidine  15 mL Mouth Rinse BID  . feeding supplement (OSMOLITE 1.5 CAL)  780 mL Per Tube Q24H  . free water  200 mL Per Tube Q4H  . insulin aspart  0-9 Units Subcutaneous Q4H  . insulin glargine  15 Units Subcutaneous Daily  . mouth rinse  15 mL Mouth Rinse q12n4p  . pantoprazole sodium  40 mg Per Tube BID  . polyethylene glycol powder  0.5 Container Per Tube Once  . sennosides  5 mL Per Tube BID  . sodium chloride flush   10-40 mL Intracatheter Q12H   Continuous Infusions:  Principal Problem:   ICH (intracerebral hemorrhage) (HCC) Active Problems:   Polysubstance abuse (HCC)   Dysphagia, post-stroke   Hyperglycemia   Hypokalemia   Hypomagnesemia   Fever   SOB (shortness of breath)   Leukocytosis   Aspiration pneumonia of both lower lobes (HCC)   Palliative care by specialist   Goals of care, counseling/discussion   DNR (do not resuscitate) discussion   Protein-calorie malnutrition, severe   Heme positive stool   Acute lower UTI   Acute blood loss anemia   Pressure injury of skin   Consultants:  Neurology  Gastroenterology  Procedures:  Echocardiogram  Core track trach tube  PEG tube  Antibiotics: Anti-infectives (From admission, onward)   Start     Dose/Rate Route Frequency Ordered Stop   05/28/20 0900  cefTRIAXone (ROCEPHIN) 1 g in sodium chloride 0.9 % 100 mL IVPB  1 g 200 mL/hr over 30 Minutes Intravenous Every 24 hours 05/28/20 0807 06/01/20 0855   05/21/20 1515  ceFAZolin (ANCEF) IVPB 2g/100 mL premix        2 g 200 mL/hr over 30 Minutes Intravenous To Radiology 05/21/20 1429 05/21/20 2100   05/14/20 1000  cefTRIAXone (ROCEPHIN) 2 g in sodium chloride 0.9 % 100 mL IVPB        2 g 200 mL/hr over 30 Minutes Intravenous Daily 05/13/20 1454 05/15/20 1255   05/13/20 2200  metroNIDAZOLE (FLAGYL) tablet 500 mg        500 mg Per Tube Every 8 hours 05/13/20 1847 05/15/20 2219   05/13/20 1400  metroNIDAZOLE (FLAGYL) tablet 500 mg  Status:  Discontinued        500 mg Oral Every 8 hours 05/13/20 0935 05/13/20 1847   05/12/20 0900  cefTRIAXone (ROCEPHIN) 1 g in sodium chloride 0.9 % 100 mL IVPB  Status:  Discontinued        1 g 200 mL/hr over 30 Minutes Intravenous Daily 05/12/20 0817 05/13/20 1454   05/12/20 0900  azithromycin (ZITHROMAX) 500 mg in sodium chloride 0.9 % 250 mL IVPB  Status:  Discontinued        500 mg 250 mL/hr over 60 Minutes Intravenous Daily 05/12/20 0817  05/13/20 0935   05/09/20 0930  Ampicillin-Sulbactam (UNASYN) 3 g in sodium chloride 0.9 % 100 mL IVPB  Status:  Discontinued        3 g 200 mL/hr over 30 Minutes Intravenous Every 8 hours 05/09/20 0837 05/09/20 0839   05/09/20 0930  Ampicillin-Sulbactam (UNASYN) 3 g in sodium chloride 0.9 % 100 mL IVPB  Status:  Discontinued        3 g 200 mL/hr over 30 Minutes Intravenous Every 6 hours 05/09/20 0839 05/12/20 0817        Time spent: 35 minutes    Junious Silkllison Lissandra Keil ANP  [facility] Pager [phone]. If 7PM-7AM, please contact night-coverage at [email or web address] 06/15/2020, 12:05 PM  LOS: 41 days

## 2020-06-15 NOTE — Progress Notes (Signed)
Physical Therapy Treatment Patient Details Name: Alan Everett MRN: [id number] DOB: 1955/12/30 Today's Date: 06/15/2020    History of Present Illness Patient is a 64 y/o male with PMH ETOH use, tobacco abuse, admitted with R side weakness,sensory loss and speech deficits.  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    PT Comments    Seen in conjunction with OTA for skilled intervention requiring two skilled practitioners to maximize treatment outcomes during this session. Pt was able to participate in over 30 mins of therapy focusing on bed mobility, sitting balance, and sit to stand transfers most requiring two person max assist.  His language is slowly improving making communication a bit better today than in previous sessions.  He remains cognitively impaired with significant R sided weakness.  PT will continue to follow acutely for safe mobility progression.  Goals due next session.  Follow Up Recommendations  SNF     Equipment Recommendations  Wheelchair (measurements PT);Wheelchair cushion (measurements PT);Hospital bed;3in1 (PT)    Recommendations for Other Services       Precautions / Restrictions Precautions Precautions: Fall Precaution Comments: L handmitt, G tube, no functional use of RUE; R side neglect    Mobility  Bed Mobility Overal bed mobility: Needs Assistance Bed Mobility: Supine to Sit;Sit to Supine     Supine to sit: Max assist;+2 for physical assistance;HOB elevated Sit to supine: Max assist;+2 for physical assistance;HOB elevated   General bed mobility comments: pt required MAX A +2 to progress OOB to pts R side. pt initiating with LLE to but required assist to maneuver RLE and elevate trunk into sitting. MAX A +2 to lower pts trunk back to supine and elevate BLEs  Transfers Overall transfer level: Needs assistance Equipment used: Ambulation equipment used Transfers: Sit to/from Stand Sit to  Stand: +2 physical assistance;Max assist         General transfer comment: Two person max assist for 4 sit to stands in stedy with varying hand positions to limit and or encourage pushing in L UE.  Cannot sustain standing long <30 seconds with support for L weight shift, hip and trunk extension.   Ambulation/Gait                 Stairs             Wheelchair Mobility    Modified Rankin (Stroke Patients Only) Modified Rankin (Stroke Patients Only) Pre-Morbid Rankin Score: No symptoms Modified Rankin: Severe disability     Balance Overall balance assessment: Needs assistance Sitting-balance support: Feet supported;Single extremity supported Sitting balance-Leahy Scale: Poor Sitting balance - Comments: min guard to max assist for sitting balance, does well if he reaches his left hand to far foot board of bed or has his left hand on his left knee, if it is beside him in the bed he tends to push R. Postural control: Posterior lean;Right lateral lean Standing balance support: Single extremity supported Standing balance-Leahy Scale: Poor Standing balance comment: reliant on external assist to maintain upright posture from standing frame and two therapists.                             Cognition Arousal/Alertness: Awake/alert Behavior During Therapy: WFL for tasks assessed/performed Overall Cognitive Status: Impaired/Different from baseline Area of Impairment: Orientation;Memory;Attention;Following commands;Safety/judgement;Awareness;Problem solving                 Orientation Level: Disoriented  to;Place;Time;Person Current Attention Level: Sustained Memory: Decreased recall of precautions;Decreased short-term memory Following Commands: Follows one step commands with increased time Safety/Judgement: Decreased awareness of safety;Decreased awareness of deficits Awareness: Intellectual Problem Solving: Slow processing;Requires verbal cues;Requires  tactile cues General Comments: Pt still with garbled speech, but mixed with some coherant speech.  When asked why he is here he reports. "I would rather not say" deferring the question.        Exercises      General Comments        Pertinent Vitals/Pain Pain Assessment: Faces Faces Pain Scale: Hurts little more Pain Location: RLE with ROM Pain Descriptors / Indicators: Grimacing;Discomfort Pain Intervention(s): Limited activity within patient's tolerance;Monitored during session;Repositioned    Home Living                      Prior Function            PT Goals (current goals can now be found in the care plan section) Progress towards PT goals: Progressing toward goals    Frequency    Min 3X/week      PT Plan Current plan remains appropriate    Co-evaluation PT/OT/SLP Co-Evaluation/Treatment: Yes Reason for Co-Treatment: Complexity of the patient's impairments (multi-system involvement);Necessary to address cognition/behavior during functional activity;For patient/therapist safety;To address functional/ADL transfers PT goals addressed during session: Mobility/safety with mobility;Balance;Proper use of DME;Strengthening/ROM        AM-PAC PT "6 Clicks" Mobility   Outcome Measure  Help needed turning from your back to your side while in a flat bed without using bedrails?: A Lot Help needed moving from lying on your back to sitting on the side of a flat bed without using bedrails?: A Lot Help needed moving to and from a bed to a chair (including a wheelchair)?: A Lot Help needed standing up from a chair using your arms (e.g., wheelchair or bedside chair)?: A Lot Help needed to walk in hospital room?: Total Help needed climbing 3-5 steps with a railing? : Total 6 Click Score: 10    End of Session Equipment Utilized During Treatment: Gait belt Activity Tolerance: Patient limited by fatigue Patient left: in bed;with call bell/phone within reach;with bed  alarm set Nurse Communication: Mobility status PT Visit Diagnosis: Unsteadiness on feet (R26.81);Muscle weakness (generalized) (M62.81);Difficulty in walking, not elsewhere classified (R26.2);Other symptoms and signs involving the nervous system (R29.898);Hemiplegia and hemiparesis Hemiplegia - Right/Left: Right Hemiplegia - dominant/non-dominant: Dominant Hemiplegia - caused by: Nontraumatic intracerebral hemorrhage     Time: 1530-1600 PT Time Calculation (min) (ACUTE ONLY): 30 min  Charges:  $Therapeutic Activity: 8-22 mins                     Corinna Capra, PT, DPT  Acute Rehabilitation [phone] pager [phone]) [phone] office

## 2020-06-15 NOTE — Plan of Care (Signed)
  Problem: Skin Integrity: Goal: Risk for impaired skin integrity will decrease Outcome: Progressing   Problem: Safety: Goal: Ability to remain free from injury will improve Outcome: Progressing   Problem: Coping: Goal: Will verbalize positive feelings about self Outcome: Progressing Goal: Will identify appropriate support needs Outcome: Progressing   Problem: Activity: Goal: Risk for activity intolerance will decrease Outcome: Progressing

## 2020-06-16 LAB — GLUCOSE, CAPILLARY
Glucose-Capillary: 103 mg/dL — ABNORMAL HIGH (ref 70–99)
Glucose-Capillary: 110 mg/dL — ABNORMAL HIGH (ref 70–99)
Glucose-Capillary: 116 mg/dL — ABNORMAL HIGH (ref 70–99)
Glucose-Capillary: 127 mg/dL — ABNORMAL HIGH (ref 70–99)
Glucose-Capillary: 141 mg/dL — ABNORMAL HIGH (ref 70–99)
Glucose-Capillary: 89 mg/dL (ref 70–99)

## 2020-06-16 NOTE — Progress Notes (Signed)
[facility] PROGRESS NOTE  Alan Everett HUD:[id number] DOB: May 30, 1956 DOA: 05/05/2020 PCP: Patient, No Pcp Per    11/16   Status: Remains inpatient appropriate because:Altered mental status and Unsafe d/c plan   Dispo: The patient is from: Home              Anticipated d/c is to: SNF              Anticipated d/c date is: > 3 days              Patient currently is medically stable to d/c.  No expectation of full recovery from neurological injury with recommendation for long-term care at skilled nursing facility   Code Status: Full Family Communication: Patient only-no family at bedside DVT prophylaxis: SCDs only-presented with intraventricular hemorrhage resulting in neurological injury Vaccination status: We will need to discuss with family if patient has previously received Covid vaccination  Foley catheter: No, external urinary device only  HPI: 64 year old male patient with history of iron deficiency anemia, alcohol use, cocaine use and tobacco abuse.  Patient presented to the ER by EMS on 10/6 as a code stroke.  At that time he was noted to have acute onset right-sided weakness, sensory loss and aphasia.  CT of the head revealed an enlarging left thalamic capsular intraparenchymal hemorrhage with increasing intraventricular hemorrhage from left ventricle into the third and fourth ventricles.  Neurosurgery consulted but no indication at that time for EVD.  Urine drug screen was found to be positive for cocaine.  Since admission hospital course has been complicated by pneumonia and recurrent blood loss anemia.  It has been noted that in the outpatient setting he had undergone EGD and colonoscopy on 8/2 at [facility] in [place].  No significant abnormalities identified and patient was recommended to continue previous iron.  He continues to have issues regarding communication as evidenced by global aphasia.  He also has moderate to severe dysphagia and is requiring feedings  via a PEG tube.  Subjective: Awakened somewhat sleepy today.  Mumbled hello and went back to sleep.   Objective: Vitals:   06/16/20 0353 06/16/20 0841  BP: (!) 114/57 120/71  Pulse: 94 96  Resp: 20 16  Temp: 98.1 F (36.7 C) 97.9 F (36.6 C)  SpO2: 99% 100%    Intake/Output Summary (Last 24 hours) at 06/16/2020 1116 Last data filed at 06/16/2020 0549 Gross per 24 hour  Intake 120 ml  Output 2000 ml  Net -1880 ml   Filed Weights   06/12/20 0500 06/14/20 0441 06/15/20 0407  Weight: 60.1 kg 58.5 kg 59.7 kg    Exam:  Constitutional: NAD, calm, comfortable Respiratory: clear to auscultation bilaterally ,Normal respiratory effort. RA Cardiovascular: Regular rate and rhythm, No extremity edema.  Skin warm and dry. Abdomen: no tenderness, Bowel sounds positive.  PEG tube Musculoskeletal: Early contractures RUE/keeps extremity flexed upon chest-appears to be developing early contracture RLE noting keeps leg flexed at hip.  Skin: Did not examine buttock decubitus today Neurologic: CN 2-12 appears to be grossly intact except for notable global aphasia. Sensation intact, Strength 4/5 left side-right side with neglect and apparent paralysis Psychiatric: Alert, responds to examiner in room to name.  Given severe dysarthria/global aphasia unable to adequately assess mentation   Assessment/Plan: Acute problems: Acute Hemorrhagic stroke/cerebral edema -Etiology  2/2 severe hypertension, alcohol and cocaine use. -Because of IVH, neurosurgery was consulted. EVD was deferred.  -Subsequent CT scans of the head showed stable findings.  -He is alert  and awake but has global aphasia, unable to follow commands. No change in neurological status in at least last 3 weeks. -Not on antiplatelet tx. LDL 80. -Barrier to discharge has been lack of insurance as well as use of cocaine on admission (however given his poor neurologic status, he would not be able to further abuse drugs going  forward)  Moderate to severe dysphagia 2/2 stroke/severe protein calorie malnutrition -Secondary to stroke. PEG tube placed on 05/21/2020. Nutrition Problem: Severe Malnutrition Etiology: social / environmental circumstances Signs/Symptoms: severe fat depletion, severe muscle depletion Interventions: Tube feeding Estimated body mass index is 21.24 kg/m as calculated from the following:   Height as of 04/09/20: 5\' 6"  (1.676 m).   Weight as of this encounter: 59.7 kg.  Acute on chronic anemia -Total of 6 units of PRBC transfusions given in this admission. -Hemoglobin stable 8-9 g/dL -GI consult appreciated. Currently endoscopic evaluation is on hold.  -Patient did have endoscopy 03/01/2020 as an outpatient with the following results: Colonoscopy-sigmoid diverticulosis EGD-normal esophagus, gastritis, normal duodenum and no other abnormal findings -Continue PPI twice daily. (See below regarding history of gastritis on recent EGD)  Goals of care -Poor neurological recovery so far.  Do not expect he will return to baseline level of functioning and likely will require long-term skilled nursing care. -Palliative care was consulted in the past. Patient remains full code.   Stage II buttock decubitus Wound / Incision (Open or Dehisced) 05/21/20 Puncture Abdomen Left;Anterior;Upper G-tube insertion site  (Active)  Date First Assessed/Time First Assessed: 05/21/20 1533   Wound Type: Puncture  Location: Abdomen  Location Orientation: Left;Anterior;Upper  Wound Description (Comments): G-tube insertion site   Present on Admission: No    Assessments 05/21/2020  3:34 PM 06/14/2020  7:48 PM  Dressing Type Gauze (Comment);Tape dressing Gauze (Comment)  Dressing Changed New New  Dressing Status Clean;Dry;Intact Clean;Dry;Intact  Dressing Change Frequency PRN --  Site / Wound Assessment Clean;Dry Clean;Dry  Peri-wound Assessment Intact --  Closure None --  Drainage Amount None --  Treatment  Cleansed Cleansed     No Linked orders to display     Pressure Injury 06/02/20 Buttocks Right Stage 2 -  Partial thickness loss of dermis presenting as a shallow open injury with a red, pink wound bed without slough. 7.5 cm in lenth by 4.5 width  (Active)  Date First Assessed/Time First Assessed: 06/02/20 0800   Location: Buttocks  Location Orientation: Right  Staging: Stage 2 -  Partial thickness loss of dermis presenting as a shallow open injury with a red, pink wound bed without slough.  Wound Descri...    Assessments 06/02/2020  8:00 AM 06/14/2020  7:48 PM  Dressing Type -- Foam - Lift dressing to assess site every shift  Dressing -- Reinforced;Clean;Dry  Dressing Change Frequency -- PRN  Peri-wound Assessment -- Intact  Wound Length (cm) 7.5 cm --  Wound Width (cm) 4.5 cm --  Wound Surface Area (cm^2) 33.75 cm^2 --  Drainage Amount None --  Treatment Other (Comment) Other (Comment)     No Linked orders to display      Other problems: Bibasilar pneumonia -resolved -CT scan 10/12 showed bibasilar pneumonia.  -Treated with broad-spectrum antibiotics.  -WBC normalized. No longer having fever.  Klebsiella UTI  -resolved -Urine culture report from 10/10 with more than 1000 CFU per mL of Klebsiella pneumoniae.  -Completed 5-day course of IV Rocephin.   Sinus tachycardia Essential hypertension/grade 1 diastolic dysfunction grade 1 diastolic dysfunction -Heart rate and blood  pressure both are stable on Coreg 3.125 mg daily. -Echocardiogram this admission revealed preserved LVEF with physiologic findings consistent with diastolic dysfunction  Hypernatremia  - resolved   Elevated liver enzymes -Right upper quadrant ultrasound-cholelithiasis without acute cholecystitis -Chronic mild elevation of liver enzymes persist.  Tobacco Abuse -Cessation counseling this admission deferred in the context of abnormal neurological status and patient unable to participate  Cocaine  abuse -Cessation counseling this admission deferred in the context of abnormal neurological status and patient unable to participate  GERD/recent gastritis on endoscopy August 2021 -PPI twice daily  Data Reviewed: Basic Metabolic Panel: No results for input(s): NA, K, CL, CO2, GLUCOSE, BUN, CREATININE, CALCIUM, MG, PHOS in the last 168 hours. Liver Function Tests: No results for input(s): AST, ALT, ALKPHOS, BILITOT, PROT, ALBUMIN in the last 168 hours. No results for input(s): LIPASE, AMYLASE in the last 168 hours. No results for input(s): AMMONIA in the last 168 hours. CBC: No results for input(s): WBC, NEUTROABS, HGB, HCT, MCV, PLT in the last 168 hours. Cardiac Enzymes: No results for input(s): CKTOTAL, CKMB, CKMBINDEX, TROPONINI in the last 168 hours. BNP (last 3 results) Recent Labs    05/29/20 0409  BNP 23.2    ProBNP (last 3 results) No results for input(s): PROBNP in the last 8760 hours.  CBG: Recent Labs  Lab 06/15/20 1615 06/15/20 1955 06/15/20 2313 06/16/20 0351 06/16/20 0841  GLUCAP 129* 126* 123* 116* 110*    No results found for this or any previous visit (from the past 240 hour(s)).   Studies: No results found.  Scheduled Meds: . carvedilol  6.25 mg Per Tube BID WC  . chlorhexidine  15 mL Mouth Rinse BID  . feeding supplement (OSMOLITE 1.5 CAL)  780 mL Per Tube Q24H  . free water  200 mL Per Tube Q4H  . insulin aspart  0-9 Units Subcutaneous Q4H  . insulin glargine  15 Units Subcutaneous Daily  . mouth rinse  15 mL Mouth Rinse q12n4p  . pantoprazole sodium  40 mg Per Tube BID  . polyethylene glycol powder  0.5 Container Per Tube Once  . sennosides  5 mL Per Tube BID  . sodium chloride flush  10-40 mL Intracatheter Q12H   Continuous Infusions:  Principal Problem:   ICH (intracerebral hemorrhage) (HCC) Active Problems:   Polysubstance abuse (HCC)   Dysphagia due to recent cerebrovascular accident (CVA)   Hyperglycemia   Hypokalemia    Hypomagnesemia   Fever   SOB (shortness of breath)   Leukocytosis   Aspiration pneumonia of both lower lobes (HCC)   Palliative care by specialist   Goals of care, counseling/discussion   DNR (do not resuscitate) discussion   Protein-calorie malnutrition, severe   Heme positive stool   Acute lower UTI   Acute blood loss anemia   Pressure injury of skin   Consultants:  Neurology  Gastroenterology  Procedures:  Echocardiogram  Core track trach tube  PEG tube  Antibiotics: Anti-infectives (From admission, onward)   Start     Dose/Rate Route Frequency Ordered Stop   05/28/20 0900  cefTRIAXone (ROCEPHIN) 1 g in sodium chloride 0.9 % 100 mL IVPB        1 g 200 mL/hr over 30 Minutes Intravenous Every 24 hours 05/28/20 0807 06/01/20 0855   05/21/20 1515  ceFAZolin (ANCEF) IVPB 2g/100 mL premix        2 g 200 mL/hr over 30 Minutes Intravenous To Radiology 05/21/20 1429 05/21/20 2100   05/14/20 1000  cefTRIAXone (ROCEPHIN) 2 g in sodium chloride 0.9 % 100 mL IVPB        2 g 200 mL/hr over 30 Minutes Intravenous Daily 05/13/20 1454 05/15/20 1255   05/13/20 2200  metroNIDAZOLE (FLAGYL) tablet 500 mg        500 mg Per Tube Every 8 hours 05/13/20 1847 05/15/20 2219   05/13/20 1400  metroNIDAZOLE (FLAGYL) tablet 500 mg  Status:  Discontinued        500 mg Oral Every 8 hours 05/13/20 0935 05/13/20 1847   05/12/20 0900  cefTRIAXone (ROCEPHIN) 1 g in sodium chloride 0.9 % 100 mL IVPB  Status:  Discontinued        1 g 200 mL/hr over 30 Minutes Intravenous Daily 05/12/20 0817 05/13/20 1454   05/12/20 0900  azithromycin (ZITHROMAX) 500 mg in sodium chloride 0.9 % 250 mL IVPB  Status:  Discontinued        500 mg 250 mL/hr over 60 Minutes Intravenous Daily 05/12/20 0817 05/13/20 0935   05/09/20 0930  Ampicillin-Sulbactam (UNASYN) 3 g in sodium chloride 0.9 % 100 mL IVPB  Status:  Discontinued        3 g 200 mL/hr over 30 Minutes Intravenous Every 8 hours 05/09/20 0837 05/09/20 0839    05/09/20 0930  Ampicillin-Sulbactam (UNASYN) 3 g in sodium chloride 0.9 % 100 mL IVPB  Status:  Discontinued        3 g 200 mL/hr over 30 Minutes Intravenous Every 6 hours 05/09/20 0839 05/12/20 0817       Time spent: 20 minutes    Junious Silk ANP  [facility] Pager [phone]. If 7PM-7AM, please contact night-coverage at [email or web address] 06/16/2020, 11:16 AM  LOS: 42 days

## 2020-06-16 NOTE — Progress Notes (Signed)
  Speech Language Pathology Treatment: Dysphagia;Cognitive-Linquistic  Patient Details Name: Alan Everett MRN: [id number] DOB: Dec 19, 1955 Today's Date: 06/16/2020 Time: 8841-6606 SLP Time Calculation (min) (ACUTE ONLY): 18 min  Assessment / Plan / Recommendation Clinical Impression  Pt was seen for dysphagia and aphasia treatment. Pt's meal tray was at bedside and pt was able to self-feed with min assistance. He tolerated puree solids and nectar thick liquids without overt s/sx of aspiration. Mod anterior spillage was observed, primarily on the R side. Pt's verbal output was improved compared to when he was last seen by this therapist. He produced multiple utterances, but was only intelligible about 50% of the time. Pt produced functional phrases for meal times, including "more" and "no more" with mod verbal cueing and direct modeling. Pt also participated in an automatic sequencing task and produced "three", "four", and "five" with mod verbal and visual cueing. SLP will continue to f/u acutely.   HPI HPI: Kiowa Hollar is a 64 y.o. male with a PMHx of alcohol use and tobacco abuse who presents acutely to the ED via EMS as a Code Stroke for acute onset of right sided weakness. Patient with disoriented, right leg weakness, right limb ataxia, right decreased sensation and Global aphasia on exam. 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 Worsening of intraventricular extension.  MBS 10/19 mod-severe dysphagia, continue NPO. PEG 10/22.  Repeat MBS 10/30 with improvements. Started dys1/nectar 11/2.       SLP Plan  Continue with current plan of care       Recommendations  Diet recommendations: Nectar-thick liquid;Dysphagia 1 (puree) Liquids provided via: Cup Medication Administration: Via alternative means Supervision: Staff to assist with self feeding;Full supervision/cueing for compensatory strategies Compensations: Minimize environmental distractions;Monitor for anterior loss                 Oral Care Recommendations: Oral care BID Follow up Recommendations: Skilled Nursing facility SLP Visit Diagnosis: Aphasia (R47.01);Dysphagia, oropharyngeal phase (R13.12) Plan: Continue with current plan of care       GO                [name] 06/16/2020, 11:31 AM

## 2020-06-17 DIAGNOSIS — I612 Nontraumatic intracerebral hemorrhage in hemisphere, unspecified: Secondary | ICD-10-CM

## 2020-06-17 LAB — GLUCOSE, CAPILLARY
Glucose-Capillary: 139 mg/dL — ABNORMAL HIGH (ref 70–99)
Glucose-Capillary: 99 mg/dL (ref 70–99)
Glucose-Capillary: 103 mg/dL — ABNORMAL HIGH (ref 70–99)
Glucose-Capillary: 92 mg/dL (ref 70–99)
Glucose-Capillary: 146 mg/dL — ABNORMAL HIGH (ref 70–99)

## 2020-06-17 MED ORDER — OSMOLITE 1.5 CAL PO LIQD
840.0000 mL | ORAL | Status: DC
  Administered 2020-06-17 – 2020-06-24 (×8): 840 mL
  Filled 2020-06-17 (×2): qty 1000
  Filled 2020-06-17: qty 948
  Filled 2020-06-17 (×7): qty 1000
  Filled 2020-06-17: qty 948

## 2020-06-17 MED ORDER — PROSOURCE TF PO LIQD
45.0000 mL | Freq: Three times a day (TID) | ORAL | Status: DC
  Administered 2020-06-17 – 2020-07-16 (×85): 45 mL
  Filled 2020-06-17 (×86): qty 45

## 2020-06-17 NOTE — Progress Notes (Signed)
[facility] PROGRESS NOTE  Bowden Boody XBL:[id number] DOB: [birth year] DOA: 05/05/2020 PCP: Patient, No Pcp Per    11/16   Status: Remains inpatient appropriate because:Altered mental status and Unsafe d/c plan   Dispo: The patient is from: Home              Anticipated d/c is to: SNF              Anticipated d/c date is: > 3 days              Patient currently is medically stable to d/c.  No expectation of full recovery from neurological injury with recommendation for long-term care at skilled nursing facility.  Needs insurance approval, disability and likely Medicaid to ensure placement at SNF   Code Status: Full Family Communication: Patient only-no family at bedside DVT prophylaxis: SCDs only-presented with intraventricular hemorrhage resulting in neurological injury Vaccination status: We will need to discuss with family if patient has previously received Covid vaccination  Foley catheter: No, external urinary device only  HPI: 64 year old male patient with history of iron deficiency anemia, alcohol use, cocaine use and tobacco abuse.  Patient presented to the ER by EMS on 10/6 as a code stroke.  At that time he was noted to have acute onset right-sided weakness, sensory loss and aphasia.  CT of the head revealed an enlarging left thalamic capsular intraparenchymal hemorrhage with increasing intraventricular hemorrhage from left ventricle into the third and fourth ventricles.  Neurosurgery consulted but no indication at that time for EVD.  Urine drug screen was found to be positive for cocaine.  Since admission hospital course has been complicated by pneumonia and recurrent blood loss anemia.  It has been noted that in the outpatient setting he had undergone EGD and colonoscopy on 8/2 at [facility] in [place].  No significant abnormalities identified and patient was recommended to continue previous iron.  He continues to have issues regarding communication as evidenced  by global aphasia.  He also has moderate to severe dysphagia and is requiring feedings via a PEG tube.  Subjective: Awake.  Sitting up in bed.  Unfortunately does not appear to be understanding what I am saying to him.  States yes to all questions and I asked him.   Objective: Vitals:   06/17/20 0357 06/17/20 0829  BP: (!) 125/57 109/66  Pulse: 90 94  Resp: 18 18  Temp: 98 F (36.7 C) 98 F (36.7 C)  SpO2: 99% 100%    Intake/Output Summary (Last 24 hours) at 06/17/2020 1112 Last data filed at 06/17/2020 0900 Gross per 24 hour  Intake 3018 ml  Output 1325 ml  Net 1693 ml   Filed Weights   06/14/20 0441 06/15/20 0407 06/17/20 0500  Weight: 58.5 kg 59.7 kg 59.3 kg    Exam:  Constitutional: NAD, calm, comfortable Respiratory: clear to auscultation bilaterally on anterior exam,Normal respiratory effort. RA Cardiovascular: Regular rate and rhythm, No extremity edema.  Skin warm and dry. Abdomen: no tenderness, Bowel sounds positive.  PEG tube Musculoskeletal: RUE neglect/keeps extremity flexed upon chest most of the time but today extremity was slightly flexed and beside patient in bed-keeps RLE flexed at hip.  Skin: Did not examine buttock decubitus  Neurologic: CN 2-12 appears to be grossly intact except for notable global aphasia. Sensation intact, Strength 4/5 left side-right side with neglect and apparent paralysis Psychiatric: Alert, responds to name.  Given severe dysarthria/global aphasia unable to adequately assess mentation   Assessment/Plan: Acute problems: Acute  Hemorrhagic stroke/cerebral edema -Etiology 2/2 severe hypertension, alcohol and cocaine use. -Because of IVH, neurosurgery was consulted. EVD was deferred.  -CT head: stable findings.  -He is alert and awake but has global aphasia, unable to follow commands. No change in neurological status > 3 weeks. -Not on antiplatelet tx secondary to etiology of stroke hemorrhagic. LDL 80.  Moderate to severe  dysphagia 2/2 stroke/severe protein calorie malnutrition -Secondary to stroke. PEG tube placed on 05/21/2020. Nutrition Problem: Severe Malnutrition Etiology: social / environmental circumstances Signs/Symptoms: severe fat depletion, severe muscle depletion Interventions: Tube feeding (nocturnal)-dysphagia 1 diet poor intake, documented as only 25 to 40% of meals taken in the past 7 days Estimated body mass index is 21.1 kg/m as calculated from the following:   Height as of 04/09/20: 5\' 6"  (1.676 m).   Weight as of this encounter: 59.3 kg.   Acute on chronic anemia -Total of 6 units of PRBC transfusions given in this admission. -Hemoglobin stable 8-9 g/dL -GI consult appreciated.  No acute indications for endoscopic evaluation and bleeding has resolved/Hgb stabilized -Patient did have endoscopy 03/01/2020 as an outpatient with the following results: Colonoscopy-sigmoid diverticulosis EGD-normal esophagus, gastritis, normal duodenum and no other abnormal findings -Continue PPI twice daily. (See below regarding history of gastritis on recent EGD)  Goals of care -Poor neurological recovery so far.  Do not expect he will return to baseline level of functioning and likely will require long-term skilled nursing care. -Palliative care was consulted in the past. Patient remains full code.   Stage II buttock decubitus Wound / Incision (Open or Dehisced) 05/21/20 Puncture Abdomen Left;Anterior;Upper G-tube insertion site  (Active)  Date First Assessed/Time First Assessed: 05/21/20 1533   Wound Type: Puncture  Location: Abdomen  Location Orientation: Left;Anterior;Upper  Wound Description (Comments): G-tube insertion site   Present on Admission: No    Assessments 05/21/2020  3:34 PM 06/14/2020  7:48 PM  Dressing Type Gauze (Comment);Tape dressing Gauze (Comment)  Dressing Changed New New  Dressing Status Clean;Dry;Intact Clean;Dry;Intact  Dressing Change Frequency PRN --  Site / Wound  Assessment Clean;Dry Clean;Dry  Peri-wound Assessment Intact --  Closure None --  Drainage Amount None --  Treatment Cleansed Cleansed     No Linked orders to display     Pressure Injury 06/02/20 Buttocks Right Stage 2 -  Partial thickness loss of dermis presenting as a shallow open injury with a red, pink wound bed without slough. 7.5 cm in lenth by 4.5 width  (Active)  Date First Assessed/Time First Assessed: 06/02/20 0800   Location: Buttocks  Location Orientation: Right  Staging: Stage 2 -  Partial thickness loss of dermis presenting as a shallow open injury with a red, pink wound bed without slough.  Wound Descri...    Assessments 06/02/2020  8:00 AM 06/16/2020  8:00 PM  Dressing -- Clean;Dry;Intact  Dressing Change Frequency -- PRN  Site / Wound Assessment -- Pink  Wound Length (cm) 7.5 cm --  Wound Width (cm) 4.5 cm --  Wound Surface Area (cm^2) 33.75 cm^2 --  Drainage Amount None --  Treatment Other (Comment) --     No Linked orders to display      Other problems: Bibasilar pneumonia -resolved -CT scan 10/12 showed bibasilar pneumonia.  -Treated with broad-spectrum antibiotics.  -WBC normalized. No longer having fever.  Klebsiella UTI  -resolved -Urine culture report from 10/10 with more than 1000 CFU per mL of Klebsiella pneumoniae.  -Completed 5-day course of IV Rocephin.   Sinus tachycardia Essential  hypertension/grade 1 diastolic dysfunction grade 1 diastolic dysfunction -Heart rate and blood pressure both are stable on Coreg 3.125 mg daily. -Echocardiogram this admission revealed preserved LVEF with physiologic findings consistent with diastolic dysfunction  Hypernatremia  - resolved   Elevated liver enzymes -Right upper quadrant ultrasound-cholelithiasis without acute cholecystitis -Chronic mild elevation of liver enzymes persist.  Tobacco Abuse -Cessation counseling this admission deferred in the context of abnormal neurological status and  patient unable to participate  Cocaine abuse -Cessation counseling this admission deferred in the context of abnormal neurological status and patient unable to participate  GERD/recent gastritis on endoscopy August 2021 -PPI twice daily  Data Reviewed: Basic Metabolic Panel: No results for input(s): NA, K, CL, CO2, GLUCOSE, BUN, CREATININE, CALCIUM, MG, PHOS in the last 168 hours. Liver Function Tests: No results for input(s): AST, ALT, ALKPHOS, BILITOT, PROT, ALBUMIN in the last 168 hours. No results for input(s): LIPASE, AMYLASE in the last 168 hours. No results for input(s): AMMONIA in the last 168 hours. CBC: No results for input(s): WBC, NEUTROABS, HGB, HCT, MCV, PLT in the last 168 hours. Cardiac Enzymes: No results for input(s): CKTOTAL, CKMB, CKMBINDEX, TROPONINI in the last 168 hours. BNP (last 3 results) Recent Labs    05/29/20 0409  BNP 23.2    ProBNP (last 3 results) No results for input(s): PROBNP in the last 8760 hours.  CBG: Recent Labs  Lab 06/16/20 1624 06/16/20 1928 06/16/20 2356 06/17/20 0356 06/17/20 0826  GLUCAP 89 127* 103* 139* 99    No results found for this or any previous visit (from the past 240 hour(s)).   Studies: No results found.  Scheduled Meds: . carvedilol  6.25 mg Per Tube BID WC  . chlorhexidine  15 mL Mouth Rinse BID  . feeding supplement (OSMOLITE 1.5 CAL)  780 mL Per Tube Q24H  . free water  200 mL Per Tube Q4H  . insulin aspart  0-9 Units Subcutaneous Q4H  . insulin glargine  15 Units Subcutaneous Daily  . mouth rinse  15 mL Mouth Rinse q12n4p  . pantoprazole sodium  40 mg Per Tube BID  . polyethylene glycol powder  0.5 Container Per Tube Once  . sennosides  5 mL Per Tube BID  . sodium chloride flush  10-40 mL Intracatheter Q12H   Continuous Infusions:  Principal Problem:   ICH (intracerebral hemorrhage) (HCC) Active Problems:   Polysubstance abuse (HCC)   Dysphagia due to recent cerebrovascular accident (CVA)    Hyperglycemia   Hypokalemia   Hypomagnesemia   Fever   SOB (shortness of breath)   Leukocytosis   Aspiration pneumonia of both lower lobes (HCC)   Palliative care by specialist   Goals of care, counseling/discussion   DNR (do not resuscitate) discussion   Protein-calorie malnutrition, severe   Heme positive stool   Acute lower UTI   Acute blood loss anemia   Pressure injury of skin   Consultants:  Neurology  Gastroenterology  Procedures:  Echocardiogram  Core track trach tube  PEG tube  Antibiotics: Anti-infectives (From admission, onward)   Start     Dose/Rate Route Frequency Ordered Stop   05/28/20 0900  cefTRIAXone (ROCEPHIN) 1 g in sodium chloride 0.9 % 100 mL IVPB        1 g 200 mL/hr over 30 Minutes Intravenous Every 24 hours 05/28/20 0807 06/01/20 0855   05/21/20 1515  ceFAZolin (ANCEF) IVPB 2g/100 mL premix        2 g 200 mL/hr over 30 Minutes  Intravenous To Radiology 05/21/20 1429 05/21/20 2100   05/14/20 1000  cefTRIAXone (ROCEPHIN) 2 g in sodium chloride 0.9 % 100 mL IVPB        2 g 200 mL/hr over 30 Minutes Intravenous Daily 05/13/20 1454 05/15/20 1255   05/13/20 2200  metroNIDAZOLE (FLAGYL) tablet 500 mg        500 mg Per Tube Every 8 hours 05/13/20 1847 05/15/20 2219   05/13/20 1400  metroNIDAZOLE (FLAGYL) tablet 500 mg  Status:  Discontinued        500 mg Oral Every 8 hours 05/13/20 0935 05/13/20 1847   05/12/20 0900  cefTRIAXone (ROCEPHIN) 1 g in sodium chloride 0.9 % 100 mL IVPB  Status:  Discontinued        1 g 200 mL/hr over 30 Minutes Intravenous Daily 05/12/20 0817 05/13/20 1454   05/12/20 0900  azithromycin (ZITHROMAX) 500 mg in sodium chloride 0.9 % 250 mL IVPB  Status:  Discontinued        500 mg 250 mL/hr over 60 Minutes Intravenous Daily 05/12/20 0817 05/13/20 0935   05/09/20 0930  Ampicillin-Sulbactam (UNASYN) 3 g in sodium chloride 0.9 % 100 mL IVPB  Status:  Discontinued        3 g 200 mL/hr over 30 Minutes Intravenous Every 8 hours  05/09/20 0837 05/09/20 0839   05/09/20 0930  Ampicillin-Sulbactam (UNASYN) 3 g in sodium chloride 0.9 % 100 mL IVPB  Status:  Discontinued        3 g 200 mL/hr over 30 Minutes Intravenous Every 6 hours 05/09/20 0839 05/12/20 0817       Time spent: 20 minutes    Junious Silkllison Cayleigh Paull ANP  [facility] Pager [phone]. If 7PM-7AM, please contact night-coverage at [email or web address] 06/17/2020, 11:12 AM  LOS: 43 days

## 2020-06-17 NOTE — Progress Notes (Signed)
Physical Therapy Treatment Patient Details Name: Alan Everett MRN: [id number] DOB: Nov 27, 1955 Today's Date: 06/17/2020    History of Present Illness Patient is a 64 y/o male with PMH ETOH use, tobacco abuse, admitted with R side weakness,sensory loss and speech deficits.  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    PT Comments    Pt making gradual progress with sitting balance and transfers.  Good initiation with sit to stands but difficulty standing up right.  Did note hip flexor tightness/tone during supine exercise that could be contributing to difficulty standing straight.  Did update goals as needed, otherwise, continue plan of care.   Follow Up Recommendations  SNF     Equipment Recommendations  Wheelchair (measurements PT);Wheelchair cushion (measurements PT);Hospital bed;3in1 (PT) (further assessemen tnext venue)    Recommendations for Other Services       Precautions / Restrictions Precautions Precautions: Fall    Mobility  Bed Mobility Overal bed mobility: Needs Assistance Bed Mobility: Supine to Sit;Sit to Supine     Supine to sit: Mod assist;HOB elevated;+2 for physical assistance Sit to supine: Mod assist;+2 for physical assistance;HOB elevated   General bed mobility comments: Pt was able to move L LE and pull with L UE to sit, increased time and cues for transfers.  For return to suipne required assist for legs and mod A for trunk.  Transfers Overall transfer level: Needs assistance Equipment used:  (back of chair) Transfers: Sit to/from Stand Sit to Stand: Mod assist;+2 physical assistance         General transfer comment: Performed 2 x sit to stand with mod A of 2.  Had pt hold onto back on chair with L UE.  Pt with good initiation requiring mod x 2 to power up but had difficulty tucking buttock and standing straight (Did note R hip flexor tightness in supine that may be  limiting).  Ambulation/Gait                 Stairs             Wheelchair Mobility    Modified Rankin (Stroke Patients Only)       Balance Overall balance assessment: Needs assistance Sitting-balance support: Feet supported;Single extremity supported;No upper extremity supported Sitting balance-Leahy Scale: Fair Sitting balance - Comments: Pt was able to sit at EOB for at least 5 mins with no UE or occasional use of L UE for support.  Provided supervision for safety.  Did need min guard when doing R LE exercises.     Standing balance-Leahy Scale: Poor Standing balance comment: 2 stands with mod A x 2 for ~20 seconds, support and buttock and shoulder for posture                            Cognition Arousal/Alertness: Awake/alert Behavior During Therapy: WFL for tasks assessed/performed Overall Cognitive Status: Impaired/Different from baseline Area of Impairment: Orientation;Memory;Attention;Following commands;Safety/judgement;Awareness;Problem solving                 Orientation Level: Disoriented to;Place;Time;Situation Current Attention Level: Sustained Memory: Decreased short-term memory Following Commands: Follows one step commands with increased time Safety/Judgement: Decreased awareness of safety;Decreased awareness of deficits Awareness: Intellectual Problem Solving: Slow processing;Requires verbal cues;Requires tactile cues;Difficulty sequencing General Comments: Pt at times speaking clearly but garbled at others.      Exercises General Exercises - Upper Extremity Shoulder Flexion: PROM;Right;5 reps;Supine Elbow  Flexion: PROM;Right;5 reps;Supine Elbow Extension: PROM;Right;5 reps Digit Composite Flexion: PROM;5 reps;Right;Supine Composite Extension: PROM;5 reps;Right;Supine General Exercises - Lower Extremity Ankle Circles/Pumps: PROM;Right;10 reps;Supine Long Arc Quad: PROM;Right;10 reps;Seated Heel Slides:  PROM;Right;Supine;10 reps Heel Slides Limitations: Did note min grimace when trying to get R LE flat on bed - pt with increased hip flexor tone/tightness limiting ROM.  Provided gentle 10 sec stretch x 3 to R hip flexors Hip ABduction/ADduction: PROM;Right;10 reps;Supine    General Comments General comments (skin integrity, edema, etc.): VSS      Pertinent Vitals/Pain Pain Assessment: Faces Faces Pain Scale: Hurts a little bit Pain Location: R hip with stretching Pain Descriptors / Indicators: Grimacing;Discomfort Pain Intervention(s): Limited activity within patient's tolerance;Monitored during session;Repositioned    Home Living                      Prior Function            PT Goals (current goals can now be found in the care plan section) Acute Rehab PT Goals Patient Stated Goal: unable to state PT Goal Formulation: Patient unable to participate in goal setting Time For Goal Achievement: 07/01/20 Potential to Achieve Goals: Fair Progress towards PT goals: Progressing toward goals;Goals met and updated - see care plan (Pt had met sitting goal and progressing toward others)    Frequency    Min 3X/week      PT Plan Current plan remains appropriate    Co-evaluation              AM-PAC PT "6 Clicks" Mobility   Outcome Measure  Help needed turning from your back to your side while in a flat bed without using bedrails?: A Lot Help needed moving from lying on your back to sitting on the side of a flat bed without using bedrails?: A Lot Help needed moving to and from a bed to a chair (including a wheelchair)?: A Lot Help needed standing up from a chair using your arms (e.g., wheelchair or bedside chair)?: A Lot Help needed to walk in hospital room?: Total Help needed climbing 3-5 steps with a railing? : Total 6 Click Score: 10    End of Session Equipment Utilized During Treatment: Gait belt Activity Tolerance: Patient tolerated treatment well Patient  left: in bed;with call bell/phone within reach;with bed alarm set Nurse Communication: Mobility status PT Visit Diagnosis: Unsteadiness on feet (R26.81);Muscle weakness (generalized) (M62.81);Difficulty in walking, not elsewhere classified (R26.2);Other symptoms and signs involving the nervous system (R29.898);Hemiplegia and hemiparesis Hemiplegia - Right/Left: Right Hemiplegia - dominant/non-dominant: Dominant Hemiplegia - caused by: Nontraumatic intracerebral hemorrhage     Time: 1530-1554 PT Time Calculation (min) (ACUTE ONLY): 24 min  Charges:  $Therapeutic Exercise: 8-22 mins $Therapeutic Activity: 8-22 mins                     Abran Richard, PT Acute Rehab Services Pager [phone] [facility] [phone]     Karlton Lemon 06/17/2020, 4:17 PM

## 2020-06-17 NOTE — Progress Notes (Addendum)
Nutrition Follow-up  DOCUMENTATION CODES:   Severe malnutrition in context of social or environmental circumstances  INTERVENTION:  Continue:  -Osmolite 1.5 @ 20m/hr via PEG for 12 hours from 1800-0600 with free water flushes per MD (currently 2040mQ4H) -Prosource TF 45 ml TID via PEG.  -Vital Cuisine shake po TID, each supplement provides 500 kcal and 22 grams of protein.   Nocturnal TF regimen will provide 1380 kcals (meets 76% minimum calorie needs), 86 grams protein (meets 100% of minimum protein needs), 63948mree water.   -Staff to assist with feedings.   NUTRITION DIAGNOSIS:   Severe Malnutrition related to social / environmental circumstances as evidenced by severe fat depletion, severe muscle depletion.  Ongoing  GOAL:   Patient will meet greater than or equal to 90% of their needs  Ongoing   MONITOR:   TF tolerance, Diet advancement, Weight trends, Skin  REASON FOR ASSESSMENT:   Consult, New TF Enteral/tube feeding initiation and management  ASSESSMENT:   [age] male presents with acute onset of right sided weakness and mixed recept and expressive aphasia and admitted with acute left thalamocapsular ICH, severe HTN. PMH includes EtOH and tobacco abuse   10/06 Admitted 10/08 Cortrak placed(gastric) 10/13 repeat CT head showed stable L thalamic hemorrhageand intraventricular extension since 05/08/2020. Regional edema, mild regional mass-effect and mild lateral ventriculomegaly and trace SAH. 10/19 failed MBS 10/22 s/p PEG  Pt pending placement.   Pt awake but not alert during RD visit. Pt continues on Dysphagia 1 diet with Nectar thick liquids. Breakfast tray was at bedside with only orange juice consumed. Per discussion with RN, he only wanted orange juice this morning and refused other items on the tray. She is unsure of any prior intake within the past few days. RN unsure of intake of Vital Cuisine shakes. Since last RD assessment, pt has consumed 15-50%  of the last 8 recorded meals (33% average meal intake).   Given pt continues to consume less than 75% of estimated needs po, recommend continuing nocturnal TF to help pt meet his needs while stimulating appetite during the day. Pt to receive Osmolite 1.5 @ 70 ml/hr for 12 hours from 1800-0600 with 200 ml free water flush Q4H (per MD) and Prosource TF TID (135 ml). This regimen provides 1380 kcals, 86 grams of protein, and 639 ml free water. This would meets 76% of his estimated calorie needs and 100% of his protein needs. Recommend increasing protein and calorie amount to ensure minimum needs are being met since po intake continues to be below 75%. Recommend monitoring for adequacy of supplement acceptance and po intake.   Labs reviewed.  Medications reviewed and include: SSI, protonix, miralax.    Diet Order:   Diet Order            DIET - DYS 1 Room service appropriate? No; Fluid consistency: Nectar Thick  Diet effective now                 EDUCATION NEEDS:   Not appropriate for education at this time  Skin:  Skin Assessment: Reviewed RN Assessment Skin Integrity Issues:: Stage II, Other (Comment) Stage II: R buttocks Other: puncture abdomen  Last BM:  last record 11/16  Height:   Ht Readings from Last 1 Encounters:  04/09/20 _0  (1.676 m)    Weight:   Wt Readings from Last 1 Encounters:  06/17/20 59.3 kg    BMI:  Body mass index is 21.1 kg/m.  Estimated Nutritional Needs:  Kcal:  1780-1980 kcals  Protein:  85-95 g  Fluid:  >/= 1.8 L   Ronnald Nian, Dietetic Intern Pager: [phone] If unavailable: [phone]

## 2020-06-17 NOTE — Progress Notes (Signed)
  Speech Language Pathology Treatment: Cognitive-Linquistic  Patient Details Name: Jessi Jessop MRN: [id number] DOB: 1955-10-08 Today's Date: 06/17/2020 Time: 3329-5188 SLP Time Calculation (min) (ACUTE ONLY): 20 min  Assessment / Plan / Recommendation Clinical Impression  Pt was seen for aphasia treatment and was alert and cooperative. His intelligibility at the phrase and sentence level remains at about 50%, but his intelligibility at the word level is about 75%. His attention was much improved as compared to previous sessions. When asked to name various playing cards, he sustained his attention for about 10 minutes with mod verbal cues to refocus to the task. He named 5 playing cards with about 60% accuracy given max verbal cues and direct modeling. Frequent perseverations were observed while the pt was naming the playing cards. SLP will continue to f/u acutely.   HPI HPI: Vishruth Seoane is a 64 y.o. male with a PMHx of alcohol use and tobacco abuse who presents acutely to the ED via EMS as a Code Stroke for acute onset of right sided weakness. Patient with disoriented, right leg weakness, right limb ataxia, right decreased sensation and Global aphasia on exam. 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 Worsening of intraventricular extension.  MBS 10/19 mod-severe dysphagia, continue NPO. PEG 10/22.  Repeat MBS 10/30 with improvements. Started dys1/nectar 11/2.       SLP Plan  Continue with current plan of care       Recommendations  Diet recommendations: Dysphagia 1 (puree);Nectar-thick liquid Liquids provided via: Cup Medication Administration: Via alternative means Supervision: Staff to assist with self feeding;Full supervision/cueing for compensatory strategies Compensations: Minimize environmental distractions;Monitor for anterior loss Postural Changes and/or Swallow Maneuvers: Seated upright 90 degrees                Oral Care Recommendations: Oral care  BID Follow up Recommendations: Skilled Nursing facility SLP Visit Diagnosis: Aphasia (R47.01) Plan: Continue with current plan of care       GO                [name] 06/17/2020, 2:32 PM

## 2020-06-18 LAB — GLUCOSE, CAPILLARY
Glucose-Capillary: 100 mg/dL — ABNORMAL HIGH (ref 70–99)
Glucose-Capillary: 114 mg/dL — ABNORMAL HIGH (ref 70–99)
Glucose-Capillary: 120 mg/dL — ABNORMAL HIGH (ref 70–99)
Glucose-Capillary: 146 mg/dL — ABNORMAL HIGH (ref 70–99)
Glucose-Capillary: 158 mg/dL — ABNORMAL HIGH (ref 70–99)
Glucose-Capillary: 90 mg/dL (ref 70–99)

## 2020-06-18 NOTE — Progress Notes (Signed)
Occupational Therapy Treatment Patient Details Name: Alan Everett MRN: [id number] DOB: 02/03/1956 Today's Date: 06/18/2020    History of present illness Patient is a 64 y/o male with PMH ETOH use, tobacco abuse, admitted with R side weakness,sensory loss and speech deficits.  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   OT comments  Pt in bed upon arrival, pt agreeable to OT session. Pt required modA+2 to progress to EOB. While sitting EOB he required modA from therapist to maintain sitting balance while engaging in ADL. Pt demonstrating improvement with visually attending to right side, yet still demonstrates right side neglect. He continues to require multimodal cues to incorporate RUE into ADL tasks. Pt continues to demonstrate cognitive limitations and physical limitations impacting safety and independence with ADL/IADL and functional mobility.  Follow Up Recommendations  SNF    Equipment Recommendations  Wheelchair (measurements OT);Wheelchair cushion (measurements OT);Hospital bed    Recommendations for Other Services Other (comment)    Precautions / Restrictions Precautions Precautions: Fall Precaution Comments: L handmitt, G tube, no functional use of RUE; R side neglect, incontinent      Mobility Bed Mobility Overal bed mobility: Needs Assistance Bed Mobility: Supine to Sit;Rolling Rolling: Max assist   Supine to sit: Mod assist;HOB elevated;+2 for physical assistance     General bed mobility comments: pt assisting with progressing BLE to EOB, modA+2 to progress to sitting upright EOB Pt incontinent of urine, required maxA to roll R<>L for linen change and pericare  Transfers Overall transfer level: Needs assistance   Transfers: Squat Pivot Transfers     Squat pivot transfers: Max assist;+2 safety/equipment     General transfer comment: squat pivot transfer with therapist providing face to face  transfer pt reaching for armrest of recliner    Balance Overall balance assessment: Needs assistance Sitting-balance support: Feet supported;Single extremity supported;No upper extremity supported Sitting balance-Leahy Scale: Fair Sitting balance - Comments: Pt was able to sit EOB for with modA for support from therapist while pt engaged in ADL, minguard with LUE support Postural control: Posterior lean;Right lateral lean                                 ADL either performed or assessed with clinical judgement   ADL Overall ADL's : Needs assistance/impaired     Grooming: Wash/dry face;Total assistance;Sitting Grooming Details (indicate cue type and reason): utilizing RUE to wash face with hand over hand assistance      Lower Body Bathing: Moderate assistance;Sitting/lateral leans Lower Body Bathing Details (indicate cue type and reason): modA for support in sitting while pt rubbed lotion onto legs, hand over hand assistance to use RUE to rub lotion on legs     Lower Body Dressing: Maximal assistance Lower Body Dressing Details (indicate cue type and reason): pt attempting to assit with pulling briefs down on left side Toilet Transfer: Maximal assistance;+2 for physical assistance;Squat-pivot Toilet Transfer Details (indicate cue type and reason): simulated squat pivot from EOB to recliner with maxA         Functional mobility during ADLs: Maximal assistance;+2 for physical assistance General ADL Comments: session focused on incoorporation of RUE into ADL tasks and balance while seated EOB     Vision   Vision Assessment?: Vision impaired- to be further tested in functional context Additional Comments: pt crossing midline and fixing gaze on therapist sitting on pt's right side.  Pt required cues to visually attend to right side of environment   Perception     Praxis      Cognition Arousal/Alertness: Awake/alert Behavior During Therapy: WFL for tasks  assessed/performed Overall Cognitive Status: Difficult to assess Area of Impairment: Attention;Following commands;Awareness;Safety/judgement;Problem solving                   Current Attention Level: Sustained Memory: Decreased short-term memory Following Commands: Follows one step commands with increased time Safety/Judgement: Decreased awareness of safety;Decreased awareness of deficits Awareness: Intellectual Problem Solving: Slow processing;Requires verbal cues;Requires tactile cues;Difficulty sequencing General Comments: pt with limited verbalization during session. Pt required multimodal cues for attending to his right side, demonstrated difficulty problem solving when rubbing lotion into bilateral hands,         Exercises Exercises: Other exercises General Exercises - Upper Extremity Shoulder Flexion: PROM;Right;5 reps;Supine Elbow Flexion: PROM;Right;5 reps;Supine Elbow Extension: PROM;Right;5 reps Wrist Flexion: PROM;Right;5 reps Wrist Extension: PROM;Strengthening;5 reps Digit Composite Flexion: PROM;5 reps;Right;Supine Composite Extension: PROM;5 reps;Right;Supine   Shoulder Instructions       General Comments vss    Pertinent Vitals/ Pain       Pain Assessment: Faces Faces Pain Scale: Hurts a little bit Pain Location: R hip with stretching Pain Descriptors / Indicators: Grimacing;Discomfort Pain Intervention(s): Limited activity within patient's tolerance;Monitored during session  Home Living                                          Prior Functioning/Environment              Frequency  Min 2X/week        Progress Toward Goals  OT Goals(current goals can now be found in the care plan section)  Progress towards OT goals: Progressing toward goals  Acute Rehab OT Goals Patient Stated Goal: unable to state OT Goal Formulation: Patient unable to participate in goal setting Time For Goal Achievement: 06/25/20 Potential to  Achieve Goals: Fair ADL Goals Pt Will Perform Grooming: sitting;with min guard assist Pt Will Transfer to Toilet: with max assist;with transfer board;bedside commode Pt/caregiver will Perform Home Exercise Program: Right Upper extremity;With written HEP provided Additional ADL Goal #1: pt will look at object in midline 50% of session Additional ADL Goal #2: Pt will be able to track right and left 5 times in succesion with increased time Additional ADL Goal #3: Pt will follow 2 step command  3 out of 5 one step commands Additional ADL Goal #4: pt will sit EOB min guard (A) as prep for transfers  Plan Discharge plan remains appropriate;Frequency remains appropriate    Co-evaluation                 AM-PAC OT "6 Clicks" Daily Activity     Outcome Measure   Help from another person eating meals?: Total Help from another person taking care of personal grooming?: A Lot Help from another person toileting, which includes using toliet, bedpan, or urinal?: Total Help from another person bathing (including washing, rinsing, drying)?: A Lot Help from another person to put on and taking off regular upper body clothing?: A Lot Help from another person to put on and taking off regular lower body clothing?: A Lot 6 Click Score: 10    End of Session Equipment Utilized During Treatment: Gait belt  OT Visit Diagnosis: Other abnormalities of gait and mobility (R26.89);Muscle weakness (  generalized) (M62.81);Low vision, both eyes (H54.2);Other symptoms and signs involving cognitive function;Hemiplegia and hemiparesis Hemiplegia - Right/Left: Right Hemiplegia - dominant/non-dominant: Dominant   Activity Tolerance Patient tolerated treatment well   Patient Left in bed;with call bell/phone within reach;with bed alarm set   Nurse Communication Mobility status;Other (comment) (needs to wear elbow splint at night on RUE)        Time: 1884-1660 OT Time Calculation (min): 26 min  Charges: OT  General Charges $OT Visit: 1 Visit OT Treatments $Self Care/Home Management : 23-37 mins  Rosey Bath OTR/L Acute Rehabilitation Services Office: [phone]    Rebeca Alert 06/18/2020, 12:55 PM

## 2020-06-18 NOTE — Progress Notes (Signed)
[facility] PROGRESS NOTE  Alan Everett VHQ:[id number] DOB: 1955-10-10 DOA: 05/05/2020 PCP: Patient, No Pcp Per    11/16   Status: Remains inpatient appropriate because:Altered mental status and Unsafe d/c plan   Dispo: The patient is from: Home              Anticipated d/c is to: SNF              Anticipated d/c date is: > 3 days              Patient currently is medically stable to d/c.  No expectation of full recovery from neurological injury with recommendation for long-term care at skilled nursing facility.  Needs insurance approval, disability and likely Medicaid to ensure placement at SNF-still waiting on needs to complete Medicaid paperwork -11/19 TOC to follow-up with niece   Code Status: Full Family Communication: Patient only-no family at bedside DVT prophylaxis: SCDs only-presented with intraventricular hemorrhage resulting in neurological injury Vaccination status: We will need to discuss with family if patient has previously received Covid vaccination  Foley catheter: No  HPI: 64 year old male patient with history of iron deficiency anemia, alcohol use, cocaine use and tobacco abuse.  Patient presented to the ER by EMS on 10/6 as a code stroke.  At that time he was noted to have acute onset right-sided weakness, sensory loss and aphasia.  CT of the head revealed an enlarging left thalamic capsular intraparenchymal hemorrhage with increasing intraventricular hemorrhage from left ventricle into the third and fourth ventricles.  Neurosurgery consulted but no indication at that time for EVD.  Urine drug screen was found to be positive for cocaine.  Since admission hospital course has been complicated by pneumonia and recurrent blood loss anemia.  It has been noted that in the outpatient setting he had undergone EGD and colonoscopy on 8/2 at [facility] in [place].  No significant abnormalities identified and patient was recommended to continue previous iron.  He  continues to have issues regarding communication as evidenced by global aphasia.  He also has moderate to severe dysphagia and is requiring feedings via a PEG tube.  Subjective: Awake.  Noted had only eaten a few bites of D1 tray.  Patient moving left side spontaneously but given his global aphasia he is unable to follow commands even when demonstrated for him (i.e. I stuck out my tongue and asked him to do the same)  Objective: Vitals:   06/18/20 0329 06/18/20 0742  BP: 120/63 137/71  Pulse: 96 95  Resp: 18 18  Temp: 97.6 F (36.4 C) 99.6 F (37.6 C)  SpO2: 100% 100%    Intake/Output Summary (Last 24 hours) at 06/18/2020 1124 Last data filed at 06/18/2020 0900 Gross per 24 hour  Intake 300 ml  Output 1050 ml  Net -750 ml   Filed Weights   06/15/20 0407 06/17/20 0500 06/18/20 0453  Weight: 59.7 kg 59.3 kg 60 kg    Exam:  Constitutional: NAD, calm, comfortable Respiratory: clear to auscultation bilaterally on anterior exam,Normal respiratory effort. RA Cardiovascular: Regular rate and rhythm, No extremity edema.  Skin warm and dry. Abdomen: no tenderness, Bowel sounds positive.  PEG tube Musculoskeletal: RUE neglect/keeps extremity flexed upon chest-keeps RLE flexed at hip.  Skin: Buttock decubitus stable per nursing documentation Neurologic: CN 2-12 appears to be grossly intact except for notable global aphasia. Sensation intact, Strength 4/5 left side-right side with neglect and paralysis Psychiatric: Alert, responds to name.  Given severe dysarthria/global aphasia unable to adequately  assess mentation   Assessment/Plan: Acute problems: Acute Hemorrhagic stroke/cerebral edema -Etiology 2/2 severe hypertension, alcohol and cocaine use. -Because of IVH, neurosurgery was consulted. EVD was deferred.  -CT head: stable findings.  -He is alert and awake but has global aphasia, unable to follow commands. No change in neurological status > 3 weeks. -Not on antiplatelet tx  secondary to hemorrhagic etiology. LDL 80.  Moderate to severe dysphagia 2/2 stroke/severe protein calorie malnutrition -Secondary to stroke. PEG tube placed on 05/21/2020. Nutrition Problem: Severe Malnutrition Etiology: social / environmental circumstances Signs/Symptoms: severe fat depletion, severe muscle depletion Interventions: Tube feeding (nocturnal)-dysphagia 1 diet poor intake, documented as only 25 to 40% of meals eaten over the past 7 to 10 days Estimated body mass index is 21.35 kg/m as calculated from the following:   Height as of 04/09/20: 5\' 6"  (1.676 m).   Weight as of this encounter: 60 kg.   Acute on chronic anemia -Total of 6 units of PRBC transfusions given in this admission. -Hemoglobin stable 8-9 g/dL -GI consult appreciated.  No acute indications for endoscopic evaluation and bleeding has resolved/Hgb stabilized -Patient did have endoscopy 03/01/2020 as an outpatient with the following results: Colonoscopy-sigmoid diverticulosis EGD-normal esophagus, gastritis, normal duodenum and no other abnormal findings -Continue PPI twice daily. (See below regarding history of gastritis on recent EGD)  Goals of care -Poor neurological recovery so far.  Do not expect he will return to baseline level of functioning and likely will require long-term skilled nursing care. -Palliative care was consulted in the past. Patient remains full code.   Stage II buttock decubitus Wound / Incision (Open or Dehisced) 05/21/20 Puncture Abdomen Left;Anterior;Upper G-tube insertion site  (Active)  Date First Assessed/Time First Assessed: 05/21/20 1533   Wound Type: Puncture  Location: Abdomen  Location Orientation: Left;Anterior;Upper  Wound Description (Comments): G-tube insertion site   Present on Admission: No    Assessments 05/21/2020  3:34 PM 06/17/2020  8:00 AM  Dressing Type Gauze (Comment);Tape dressing --  Dressing Changed New --  Dressing Status Clean;Dry;Intact --  Dressing  Change Frequency PRN --  Site / Wound Assessment Clean;Dry --  Peri-wound Assessment Intact --  Closure None --  Drainage Amount None --  Treatment Cleansed Cleansed     No Linked orders to display     Pressure Injury 06/02/20 Buttocks Right Stage 2 -  Partial thickness loss of dermis presenting as a shallow open injury with a red, pink wound bed without slough. 7.5 cm in lenth by 4.5 width  (Active)  Date First Assessed/Time First Assessed: 06/02/20 0800   Location: Buttocks  Location Orientation: Right  Staging: Stage 2 -  Partial thickness loss of dermis presenting as a shallow open injury with a red, pink wound bed without slough.  Wound Descri...    Assessments 06/02/2020  8:00 AM 06/17/2020  8:00 PM  Dressing Type -- Foam - Lift dressing to assess site every shift  Dressing Change Frequency -- PRN  Site / Wound Assessment -- Pink  Peri-wound Assessment -- Intact  Wound Length (cm) 7.5 cm --  Wound Width (cm) 4.5 cm --  Wound Surface Area (cm^2) 33.75 cm^2 --  Margins -- Attached edges (approximated)  Drainage Amount None None  Treatment Other (Comment) --     No Linked orders to display      Other problems: Bibasilar pneumonia -resolved -CT scan 10/12 showed bibasilar pneumonia.  -Treated with broad-spectrum antibiotics.  -WBC normalized. No longer having fever.  Klebsiella UTI  -resolved -  Urine culture report from 10/10 with more than 1000 CFU per mL of Klebsiella pneumoniae.  -Completed 5-day course of IV Rocephin.   Sinus tachycardia Essential hypertension/grade 1 diastolic dysfunction grade 1 diastolic dysfunction -Heart rate and blood pressure both are stable on Coreg 3.125 mg daily. -Echocardiogram this admission revealed preserved LVEF with physiologic findings consistent with diastolic dysfunction  Hypernatremia  - resolved   Elevated liver enzymes -Right upper quadrant ultrasound-cholelithiasis without acute cholecystitis -Chronic mild elevation  of liver enzymes persist.  Tobacco Abuse -Cessation counseling this admission deferred in the context of abnormal neurological status and patient unable to participate  Cocaine abuse -Cessation counseling this admission deferred in the context of abnormal neurological status and patient unable to participate  GERD/recent gastritis on endoscopy August 2021 -PPI twice daily  Data Reviewed: Basic Metabolic Panel: No results for input(s): NA, K, CL, CO2, GLUCOSE, BUN, CREATININE, CALCIUM, MG, PHOS in the last 168 hours. Liver Function Tests: No results for input(s): AST, ALT, ALKPHOS, BILITOT, PROT, ALBUMIN in the last 168 hours. No results for input(s): LIPASE, AMYLASE in the last 168 hours. No results for input(s): AMMONIA in the last 168 hours. CBC: No results for input(s): WBC, NEUTROABS, HGB, HCT, MCV, PLT in the last 168 hours. Cardiac Enzymes: No results for input(s): CKTOTAL, CKMB, CKMBINDEX, TROPONINI in the last 168 hours. BNP (last 3 results) Recent Labs    05/29/20 0409  BNP 23.2    ProBNP (last 3 results) No results for input(s): PROBNP in the last 8760 hours.  CBG: Recent Labs  Lab 06/17/20 1612 06/17/20 2002 06/18/20 0016 06/18/20 0332 06/18/20 0737  GLUCAP 92 146* 120* 158* 90    No results found for this or any previous visit (from the past 240 hour(s)).   Studies: No results found.  Scheduled Meds: . carvedilol  6.25 mg Per Tube BID WC  . chlorhexidine  15 mL Mouth Rinse BID  . feeding supplement (OSMOLITE 1.5 CAL)  840 mL Per Tube Q24H  . feeding supplement (PROSource TF)  45 mL Per Tube TID  . free water  200 mL Per Tube Q4H  . insulin aspart  0-9 Units Subcutaneous Q4H  . insulin glargine  15 Units Subcutaneous Daily  . mouth rinse  15 mL Mouth Rinse q12n4p  . pantoprazole sodium  40 mg Per Tube BID  . polyethylene glycol powder  0.5 Container Per Tube Once  . sennosides  5 mL Per Tube BID  . sodium chloride flush  10-40 mL Intracatheter  Q12H   Continuous Infusions:  Principal Problem:   ICH (intracerebral hemorrhage) (HCC) Active Problems:   Polysubstance abuse (HCC)   Dysphagia due to recent cerebrovascular accident (CVA)   Hyperglycemia   Hypokalemia   Hypomagnesemia   Fever   SOB (shortness of breath)   Leukocytosis   Aspiration pneumonia of both lower lobes (HCC)   Palliative care by specialist   Goals of care, counseling/discussion   DNR (do not resuscitate) discussion   Protein-calorie malnutrition, severe   Heme positive stool   Acute lower UTI   Acute blood loss anemia   Pressure injury of skin   Consultants:  Neurology  Gastroenterology  Procedures:  Echocardiogram  Core track trach tube  PEG tube  Antibiotics: Anti-infectives (From admission, onward)   Start     Dose/Rate Route Frequency Ordered Stop   05/28/20 0900  cefTRIAXone (ROCEPHIN) 1 g in sodium chloride 0.9 % 100 mL IVPB        1  g 200 mL/hr over 30 Minutes Intravenous Every 24 hours 05/28/20 0807 06/01/20 0855   05/21/20 1515  ceFAZolin (ANCEF) IVPB 2g/100 mL premix        2 g 200 mL/hr over 30 Minutes Intravenous To Radiology 05/21/20 1429 05/21/20 2100   05/14/20 1000  cefTRIAXone (ROCEPHIN) 2 g in sodium chloride 0.9 % 100 mL IVPB        2 g 200 mL/hr over 30 Minutes Intravenous Daily 05/13/20 1454 05/15/20 1255   05/13/20 2200  metroNIDAZOLE (FLAGYL) tablet 500 mg        500 mg Per Tube Every 8 hours 05/13/20 1847 05/15/20 2219   05/13/20 1400  metroNIDAZOLE (FLAGYL) tablet 500 mg  Status:  Discontinued        500 mg Oral Every 8 hours 05/13/20 0935 05/13/20 1847   05/12/20 0900  cefTRIAXone (ROCEPHIN) 1 g in sodium chloride 0.9 % 100 mL IVPB  Status:  Discontinued        1 g 200 mL/hr over 30 Minutes Intravenous Daily 05/12/20 0817 05/13/20 1454   05/12/20 0900  azithromycin (ZITHROMAX) 500 mg in sodium chloride 0.9 % 250 mL IVPB  Status:  Discontinued        500 mg 250 mL/hr over 60 Minutes Intravenous Daily  05/12/20 0817 05/13/20 0935   05/09/20 0930  Ampicillin-Sulbactam (UNASYN) 3 g in sodium chloride 0.9 % 100 mL IVPB  Status:  Discontinued        3 g 200 mL/hr over 30 Minutes Intravenous Every 8 hours 05/09/20 0837 05/09/20 0839   05/09/20 0930  Ampicillin-Sulbactam (UNASYN) 3 g in sodium chloride 0.9 % 100 mL IVPB  Status:  Discontinued        3 g 200 mL/hr over 30 Minutes Intravenous Every 6 hours 05/09/20 0839 05/12/20 0817       Time spent: 20 minutes    Junious Silk ANP  [facility] Pager [phone]. If 7PM-7AM, please contact night-coverage at [email or web address] 06/18/2020, 11:24 AM  LOS: 44 days

## 2020-06-18 NOTE — Plan of Care (Signed)
  Problem: Education: Goal: Knowledge of General Education information will improve Description: Including pain rating scale, medication(s)/side effects and non-pharmacologic comfort measures Outcome: Progressing   Problem: Health Behavior/Discharge Planning: Goal: Ability to manage health-related needs will improve Outcome: Progressing   Problem: Clinical Measurements: Goal: Ability to maintain clinical measurements within normal limits will improve Outcome: Progressing Goal: Will remain free from infection Outcome: Progressing Goal: Diagnostic test results will improve Outcome: Progressing Goal: Respiratory complications will improve Outcome: Progressing Goal: Cardiovascular complication will be avoided Outcome: Progressing   Problem: Activity: Goal: Risk for activity intolerance will decrease Outcome: Progressing   Problem: Nutrition: Goal: Adequate nutrition will be maintained Outcome: Progressing   Problem: Coping: Goal: Level of anxiety will decrease Outcome: Progressing   Problem: Elimination: Goal: Will not experience complications related to bowel motility Outcome: Progressing Goal: Will not experience complications related to urinary retention Outcome: Progressing   Problem: Pain Managment: Goal: General experience of comfort will improve Outcome: Progressing   Problem: Safety: Goal: Ability to remain free from injury will improve Outcome: Progressing   Problem: Skin Integrity: Goal: Risk for impaired skin integrity will decrease Outcome: Progressing   Problem: Education: Goal: Knowledge of disease or condition will improve Outcome: Progressing Goal: Knowledge of secondary prevention will improve Outcome: Progressing Goal: Knowledge of patient specific risk factors addressed and post discharge goals established will improve Outcome: Progressing Goal: Individualized Educational Video(s) Outcome: Progressing   Problem: Coping: Goal: Will verbalize  positive feelings about self Outcome: Progressing Goal: Will identify appropriate support needs Outcome: Progressing   Problem: Health Behavior/Discharge Planning: Goal: Ability to manage health-related needs will improve Outcome: Progressing   Problem: Self-Care: Goal: Ability to participate in self-care as condition permits will improve Outcome: Progressing Goal: Verbalization of feelings and concerns over difficulty with self-care will improve Outcome: Progressing Goal: Ability to communicate needs accurately will improve Outcome: Progressing   Problem: Nutrition: Goal: Risk of aspiration will decrease Outcome: Progressing Goal: Dietary intake will improve Outcome: Progressing   Problem: Intracerebral Hemorrhage Tissue Perfusion: Goal: Complications of Intracerebral Hemorrhage will be minimized Outcome: Progressing   

## 2020-06-19 LAB — GLUCOSE, CAPILLARY
Glucose-Capillary: 123 mg/dL — ABNORMAL HIGH (ref 70–99)
Glucose-Capillary: 138 mg/dL — ABNORMAL HIGH (ref 70–99)
Glucose-Capillary: 141 mg/dL — ABNORMAL HIGH (ref 70–99)
Glucose-Capillary: 142 mg/dL — ABNORMAL HIGH (ref 70–99)
Glucose-Capillary: 149 mg/dL — ABNORMAL HIGH (ref 70–99)
Glucose-Capillary: 95 mg/dL (ref 70–99)

## 2020-06-19 NOTE — Plan of Care (Signed)
Not progressing at this time due to confusion and altered mental status.  Problem: Education: Goal: Knowledge of General Education information will improve Description: Including pain rating scale, medication(s)/side effects and non-pharmacologic comfort measures Outcome: Not Progressing   Problem: Health Behavior/Discharge Planning: Goal: Ability to manage health-related needs will improve Outcome: Not Progressing   Problem: Clinical Measurements: Goal: Ability to maintain clinical measurements within normal limits will improve Outcome: Not Progressing Goal: Will remain free from infection Outcome: Not Progressing Goal: Diagnostic test results will improve Outcome: Not Progressing Goal: Respiratory complications will improve Outcome: Not Progressing Goal: Cardiovascular complication will be avoided Outcome: Not Progressing   Problem: Activity: Goal: Risk for activity intolerance will decrease Outcome: Not Progressing   Problem: Nutrition: Goal: Adequate nutrition will be maintained Outcome: Not Progressing   Problem: Coping: Goal: Level of anxiety will decrease Outcome: Not Progressing   Problem: Elimination: Goal: Will not experience complications related to bowel motility Outcome: Not Progressing Goal: Will not experience complications related to urinary retention Outcome: Not Progressing   Problem: Pain Managment: Goal: General experience of comfort will improve Outcome: Not Progressing   Problem: Safety: Goal: Ability to remain free from injury will improve Outcome: Not Progressing   Problem: Skin Integrity: Goal: Risk for impaired skin integrity will decrease Outcome: Not Progressing   Problem: Education: Goal: Knowledge of disease or condition will improve Outcome: Not Progressing Goal: Knowledge of secondary prevention will improve Outcome: Not Progressing Goal: Knowledge of patient specific risk factors addressed and post discharge goals established  will improve Outcome: Not Progressing Goal: Individualized Educational Video(s) Outcome: Not Progressing   Problem: Coping: Goal: Will verbalize positive feelings about self Outcome: Not Progressing Goal: Will identify appropriate support needs Outcome: Not Progressing   Problem: Health Behavior/Discharge Planning: Goal: Ability to manage health-related needs will improve Outcome: Not Progressing   Problem: Self-Care: Goal: Ability to participate in self-care as condition permits will improve Outcome: Not Progressing Goal: Verbalization of feelings and concerns over difficulty with self-care will improve Outcome: Not Progressing Goal: Ability to communicate needs accurately will improve Outcome: Not Progressing   Problem: Nutrition: Goal: Risk of aspiration will decrease Outcome: Not Progressing Goal: Dietary intake will improve Outcome: Not Progressing   Problem: Intracerebral Hemorrhage Tissue Perfusion: Goal: Complications of Intracerebral Hemorrhage will be minimized Outcome: Not Progressing

## 2020-06-19 NOTE — Progress Notes (Signed)
Patient is on the long length of stay team.  64 y.o. male with a PMHx of alcohol use and tobacco abuse. He presented to the ED via EMS on 10/6 as a Code Stroke for acute onset of right sided weakness, sensory loss and aphasia. He was found to have on CT head an enlarging L thalamocapsular IPH w/ increasing IVH from L ventricle into 3rd and 4th ventricles.  Neurosurgery was initially consulted for EVD consideration which was then deferred.   Hospital course complicated by pneumonia and recurrent blood loss anemia. Pending placement at this time.  Awaiting niece to complete medicaid paperwork.   No overnight events.  Patient appears comfortable and is cooperative [employer] DO

## 2020-06-20 DIAGNOSIS — K9423 Gastrostomy malfunction: Secondary | ICD-10-CM

## 2020-06-20 LAB — GLUCOSE, CAPILLARY
Glucose-Capillary: 106 mg/dL — ABNORMAL HIGH (ref 70–99)
Glucose-Capillary: 108 mg/dL — ABNORMAL HIGH (ref 70–99)
Glucose-Capillary: 112 mg/dL — ABNORMAL HIGH (ref 70–99)
Glucose-Capillary: 123 mg/dL — ABNORMAL HIGH (ref 70–99)
Glucose-Capillary: 141 mg/dL — ABNORMAL HIGH (ref 70–99)
Glucose-Capillary: 93 mg/dL (ref 70–99)
Glucose-Capillary: 94 mg/dL (ref 70–99)

## 2020-06-20 MED ORDER — PANTOPRAZOLE SODIUM 40 MG IV SOLR
40.0000 mg | Freq: Two times a day (BID) | INTRAVENOUS | Status: DC
  Administered 2020-06-20 (×2): 40 mg via INTRAVENOUS
  Filled 2020-06-20 (×2): qty 40

## 2020-06-20 MED ORDER — SODIUM CHLORIDE 0.45 % IV SOLN: INTRAVENOUS | Status: AC

## 2020-06-20 MED ORDER — METOPROLOL TARTRATE 5 MG/5ML IV SOLN
2.5000 mg | Freq: Two times a day (BID) | INTRAVENOUS | Status: DC
  Administered 2020-06-20 (×2): 2.5 mg via INTRAVENOUS
  Filled 2020-06-20 (×2): qty 5

## 2020-06-20 MED ORDER — INSULIN GLARGINE 100 UNIT/ML ~~LOC~~ SOLN
5.0000 [IU] | Freq: Every day | SUBCUTANEOUS | Status: DC
  Administered 2020-06-21 – 2020-07-16 (×26): 5 [IU] via SUBCUTANEOUS
  Filled 2020-06-20 (×28): qty 0.05

## 2020-06-20 MED ORDER — SODIUM CHLORIDE 0.45 % IV SOLN: INTRAVENOUS | Status: DC

## 2020-06-20 NOTE — Progress Notes (Addendum)
PROGRESS NOTE    Alan Everett  MIW:[id number]  DOB: 04/11/1956  PCP: Patient, No Pcp Per 64 y.o.malewith a PMHx of alcohol use and tobacco abuse. He presented to the ED via EMS on 10/6 as a Code Stroke for acute onset of right sided weakness, sensory loss and aphasia. 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  Neurosurgery was initially consulted for EVD consideration which was then deferred.  Hospital course complicated by pneumonia and recurrent blood loss anemia. Pending placement at this time.  Awaiting niece to complete medicaid paperwork   Subjective:  Patient awake alert and comfortable when seen this morning.  Denied any complaints and went back to sleep.  Contacted by nurse later in the day stating PEG tube clogged and unable to administer her morning meds/feeds  Objective: Vitals:   06/20/20 0014 06/20/20 0421 06/20/20 0802 06/20/20 1232  BP: 133/69 124/75 131/67 125/75  Pulse: (!) 107 88 96 94  Resp: 18 20 18    Temp: 99.2 F (37.3 C) 100.2 F (37.9 C)    TempSrc: Oral Oral    SpO2: 100% 100% 100% 100%  Weight:        Intake/Output Summary (Last 24 hours) at 06/20/2020 1353 Last data filed at 06/20/2020 1031 Gross per 24 hour  Intake 06/22/2020 ml  Output 725 ml  Net 11144 ml   Filed Weights   06/15/20 0407 06/17/20 0500 06/18/20 0453  Weight: 59.7 kg 59.3 kg 60 kg    Physical Examination:  General:  no acute distress noted Heart: S1-S2 heard, regular rate and rhythm, no murmurs.  No leg edema noted Lungs: Equal air entry bilaterally, no rhonchi or rales on exam, no accessory muscle use Abdomen: Bowel sounds heard, soft, nontender, nondistended.  Extremities: No pedal edema.  No cyanosis or clubbing. Neurological: Global aphasia, intermittently follows commands  Data Reviewed: I have personally reviewed following labs and imaging studies  CBC: No results for  input(s): WBC, NEUTROABS, HGB, HCT, MCV, PLT in the last 168 hours. Basic Metabolic Panel: No results for input(s): NA, K, CL, CO2, GLUCOSE, BUN, CREATININE, CALCIUM, MG, PHOS in the last 168 hours. GFR: Estimated Creatinine Clearance: 79.2 mL/min (by C-G formula based on SCr of 0.61 mg/dL). Liver Function Tests: No results for input(s): AST, ALT, ALKPHOS, BILITOT, PROT, ALBUMIN in the last 168 hours. No results for input(s): LIPASE, AMYLASE in the last 168 hours. No results for input(s): AMMONIA in the last 168 hours. Coagulation Profile: No results for input(s): INR, PROTIME in the last 168 hours. Cardiac Enzymes: No results for input(s): CKTOTAL, CKMB, CKMBINDEX, TROPONINI in the last 168 hours. BNP (last 3 results) No results for input(s): PROBNP in the last 8760 hours. HbA1C: No results for input(s): HGBA1C in the last 72 hours. CBG: Recent Labs  Lab 06/19/20 2026 06/20/20 0011 06/20/20 0425 06/20/20 0803 06/20/20 1220  GLUCAP 142* 141* 112* 106* 123*   Lipid Profile: No results for input(s): CHOL, HDL, LDLCALC, TRIG, CHOLHDL, LDLDIRECT in the last 72 hours. Thyroid Function Tests: No results for input(s): TSH, T4TOTAL, FREET4, T3FREE, THYROIDAB in the last 72 hours. Anemia Panel: No results for input(s): VITAMINB12, FOLATE, FERRITIN, TIBC, IRON, RETICCTPCT in the last 72 hours. Sepsis Labs: No results for input(s): PROCALCITON, LATICACIDVEN in the last 168 hours.  No results found for this or any previous visit (from the past 240 hour(s)).    Radiology Studies: No results found.  Scheduled Meds: . carvedilol  6.25 mg Per Tube BID WC  . chlorhexidine  15 mL Mouth Rinse BID  . feeding supplement (OSMOLITE 1.5 CAL)  840 mL Per Tube Q24H  . feeding supplement (PROSource TF)  45 mL Per Tube TID  . free water  200 mL Per Tube Q4H  . insulin aspart  0-9 Units Subcutaneous Q4H  . insulin glargine  15 Units Subcutaneous Daily  . mouth rinse  15 mL Mouth Rinse q12n4p   . pantoprazole sodium  40 mg Per Tube BID  . polyethylene glycol powder  0.5 Container Per Tube Once  . sennosides  5 mL Per Tube BID  . sodium chloride flush  10-40 mL Intracatheter Q12H   Continuous Infusions:  Assessment and plan:  1.  Acute hemorrhagic CVA with cerebral edema: In the setting of hypertension, cocaine use and alcohol abuse.  EVD deferred by neurosurgery.  Repeat CT head with stable findings.  No acute change in neurological status.  He is s/p PEG tube due to dysphagia/severe protein calorie malnutrition.  2.  Malfunctioning PEG tube: Will place on IV hydration and contact IR for clogged PEG tube, nursing staff unable to flush.  Change meds to IV as needed. Reduce  long-acting insulin (Lantus 15 units and sliding scale) while holding tube feeds.  3.  Acute on chronic anemia: Requiring 6 units of PRBC during the hospital course.  4: Bibasilar pneumonia/Klebsiella UTI: S/p treatment.  Today low-grade temp of 100.2 noted.  Monitor trend, repeat CBC in a.m. check UA  5.  Hypernatremia: Present during the initial hospital course, then resolved.  IV fluids and BMP in a.m.   Time spent: 25 minutes     >50% time spent in discussions with care team and coordination of care.    Alessandra Bevels, MD [facility] Pager in [place]  If 7PM-7AM, please contact night-coverage [email or web address] 06/20/2020, 1:53 PM

## 2020-06-20 NOTE — Plan of Care (Signed)
Not progressing towards goals of care due to altered mental status and confusion.   Problem: Education: Goal: Knowledge of General Education information will improve Description: Including pain rating scale, medication(s)/side effects and non-pharmacologic comfort measures Outcome: Not Progressing   Problem: Health Behavior/Discharge Planning: Goal: Ability to manage health-related needs will improve Outcome: Not Progressing   Problem: Clinical Measurements: Goal: Ability to maintain clinical measurements within normal limits will improve Outcome: Not Progressing Goal: Will remain free from infection Outcome: Not Progressing Goal: Diagnostic test results will improve Outcome: Not Progressing Goal: Respiratory complications will improve Outcome: Not Progressing Goal: Cardiovascular complication will be avoided Outcome: Not Progressing   Problem: Activity: Goal: Risk for activity intolerance will decrease Outcome: Not Progressing   Problem: Nutrition: Goal: Adequate nutrition will be maintained Outcome: Not Progressing   Problem: Coping: Goal: Level of anxiety will decrease Outcome: Not Progressing   Problem: Elimination: Goal: Will not experience complications related to bowel motility Outcome: Not Progressing Goal: Will not experience complications related to urinary retention Outcome: Not Progressing   Problem: Pain Managment: Goal: General experience of comfort will improve Outcome: Not Progressing   Problem: Safety: Goal: Ability to remain free from injury will improve Outcome: Not Progressing   Problem: Skin Integrity: Goal: Risk for impaired skin integrity will decrease Outcome: Not Progressing   Problem: Education: Goal: Knowledge of disease or condition will improve Outcome: Not Progressing Goal: Knowledge of secondary prevention will improve Outcome: Not Progressing Goal: Knowledge of patient specific risk factors addressed and post discharge goals  established will improve Outcome: Not Progressing Goal: Individualized Educational Video(s) Outcome: Not Progressing   Problem: Coping: Goal: Will verbalize positive feelings about self Outcome: Not Progressing Goal: Will identify appropriate support needs Outcome: Not Progressing   Problem: Health Behavior/Discharge Planning: Goal: Ability to manage health-related needs will improve Outcome: Not Progressing   Problem: Self-Care: Goal: Ability to participate in self-care as condition permits will improve Outcome: Not Progressing Goal: Verbalization of feelings and concerns over difficulty with self-care will improve Outcome: Not Progressing Goal: Ability to communicate needs accurately will improve Outcome: Not Progressing   Problem: Nutrition: Goal: Risk of aspiration will decrease Outcome: Not Progressing Goal: Dietary intake will improve Outcome: Not Progressing   Problem: Intracerebral Hemorrhage Tissue Perfusion: Goal: Complications of Intracerebral Hemorrhage will be minimized Outcome: Not Progressing

## 2020-06-21 DIAGNOSIS — I611 Nontraumatic intracerebral hemorrhage in hemisphere, cortical: Secondary | ICD-10-CM

## 2020-06-21 LAB — CBC
HCT: 27.5 % — ABNORMAL LOW (ref 39.0–52.0)
Hemoglobin: 8.5 g/dL — ABNORMAL LOW (ref 13.0–17.0)
MCH: 30.2 pg (ref 26.0–34.0)
MCHC: 30.9 g/dL (ref 30.0–36.0)
MCV: 97.9 fL (ref 80.0–100.0)
Platelets: 283 10*3/uL (ref 150–400)
RBC: 2.81 MIL/uL — ABNORMAL LOW (ref 4.22–5.81)
RDW: 15.8 % — ABNORMAL HIGH (ref 11.5–15.5)
WBC: 11.9 10*3/uL — ABNORMAL HIGH (ref 4.0–10.5)
nRBC: 0 % (ref 0.0–0.2)

## 2020-06-21 LAB — BASIC METABOLIC PANEL
Anion gap: 11 (ref 5–15)
BUN: 14 mg/dL (ref 8–23)
CO2: 24 mmol/L (ref 22–32)
Calcium: 9.1 mg/dL (ref 8.9–10.3)
Chloride: 101 mmol/L (ref 98–111)
Creatinine, Ser: 0.58 mg/dL — ABNORMAL LOW (ref 0.61–1.24)
GFR, Estimated: >60 mL/min (ref >60)
Glucose, Bld: 96 mg/dL (ref 70–99)
Potassium: 3.4 mmol/L — ABNORMAL LOW (ref 3.5–5.1)
Sodium: 136 mmol/L (ref 135–145)

## 2020-06-21 LAB — GLUCOSE, CAPILLARY
Glucose-Capillary: 101 mg/dL — ABNORMAL HIGH (ref 70–99)
Glucose-Capillary: 105 mg/dL — ABNORMAL HIGH (ref 70–99)
Glucose-Capillary: 114 mg/dL — ABNORMAL HIGH (ref 70–99)
Glucose-Capillary: 119 mg/dL — ABNORMAL HIGH (ref 70–99)
Glucose-Capillary: 161 mg/dL — ABNORMAL HIGH (ref 70–99)
Glucose-Capillary: 219 mg/dL — ABNORMAL HIGH (ref 70–99)

## 2020-06-21 MED ORDER — CARVEDILOL 6.25 MG PO TABS
6.2500 mg | ORAL_TABLET | Freq: Two times a day (BID) | ORAL | Status: DC
  Administered 2020-06-21 – 2020-07-16 (×46): 6.25 mg
  Filled 2020-06-21 (×51): qty 1

## 2020-06-21 MED ORDER — PANTOPRAZOLE SODIUM 40 MG PO PACK
40.0000 mg | PACK | Freq: Two times a day (BID) | ORAL | Status: DC
  Administered 2020-06-21 – 2020-07-16 (×50): 40 mg
  Filled 2020-06-21 (×50): qty 20

## 2020-06-21 NOTE — Progress Notes (Signed)
Occupational Therapy Treatment Patient Details Name: Alan Everett MRN: [id number] DOB: June 19, 1956 Today's Date: 06/21/2020    History of present illness Patient is a 64 y/o male with PMH ETOH use, tobacco abuse, admitted with R side weakness,sensory loss and speech deficits.  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   OT comments  Patient continues to make steady progress towards goals in skilled OT session. Patient's session encompassed vision activities, following commands to increase cognition, and PROM to RUE and attempts to incorporate RUE into functional tasks. Pt continues to demonstrate difficulty crossing midline to look R when prompted, especially without moving head. Pt also able to follow one step commands 40% of the time, with waxing and waning abilities to respond appropriately. Pt placed in semi-chair position at end of session as pt demonstrated increased distress when placed in full chair position (HOB reclined 10-15 degrees for comfort) Discharge remains appropriate, will continue to follow acutely.    Follow Up Recommendations  SNF    Equipment Recommendations  Wheelchair (measurements OT);Wheelchair cushion (measurements OT);Hospital bed    Recommendations for Other Services Other (comment)    Precautions / Restrictions Precautions Precautions: Fall Precaution Comments: G tube, right neglect, prevalon boot Restrictions Weight Bearing Restrictions: No       Mobility Bed Mobility Overal bed mobility: Needs Assistance Bed Mobility: Supine to Sit     Supine to sit: Max assist;HOB elevated;+2 for physical assistance Sit to supine: Max assist;+2 for physical assistance;HOB elevated   General bed mobility comments: Placed in chair position at end of session  Transfers Overall transfer level: Needs assistance Equipment used: Ambulation equipment used Transfers: Sit to/from Stand Sit to Stand: Mod  assist;Max assist;+2 physical assistance;From elevated surface         General transfer comment: Worked on standing bouts from EOB using stedy with Mod-Max A of 2 with flexed trunk, knees flexed and difficulty elevating trunk despite tactile cues. Limited standing tolerance. Performed from EOB x4.    Balance Overall balance assessment: Needs assistance Sitting-balance support: Feet supported;Single extremity supported Sitting balance-Leahy Scale: Fair Sitting balance - Comments: Able to sit EOB with 1 UE support and at times no UE support reaching for therapist outside BoS in all directions. Worked on cervical AROM and stretching to cervical musculature. Difficulty rotating neck to right. Postural control: Posterior lean;Right lateral lean Standing balance support: During functional activity Standing balance-Leahy Scale: Poor Standing balance comment: Requires hands on assist for standing instedy frame with max cues for upright posture and hip extension. Stood ~15-20 seconds each bout.                           ADL either performed or assessed with clinical judgement   ADL Overall ADL's : Needs assistance/impaired                                     Functional mobility during ADLs: Maximal assistance;+2 for physical assistance General ADL Comments: session focus on vision, following commands, and PROM and incorporation of RUE into functional tasks     Vision       Perception     Praxis      Cognition Arousal/Alertness: Awake/alert Behavior During Therapy: WFL for tasks assessed/performed Overall Cognitive Status: Difficult to assess Area of Impairment: Attention;Following commands;Awareness;Safety/judgement;Problem solving  Current Attention Level: Sustained   Following Commands: Follows one step commands with increased time;Follows one step commands inconsistently Safety/Judgement: Decreased awareness of safety;Decreased  awareness of deficits Awareness: Intellectual Problem Solving: Slow processing;Requires verbal cues;Requires tactile cues;Difficulty sequencing General Comments: pt with limited verbalization during session. Pt required multimodal cues for attending to his right side, difficulty following commands consistently, does well with gestures, tactile cues.        Exercises General Exercises - Upper Extremity Elbow Flexion: PROM;Right;Supine;10 reps Elbow Extension: PROM;Right;Both;10 reps;Supine Wrist Flexion: PROM;Right;10 reps;Supine Wrist Extension: PROM;Right;10 reps;Supine Digit Composite Flexion: PROM;5 reps;Right;Supine Composite Extension: PROM;5 reps;Right;Supine General Exercises - Lower Extremity Long Arc Quad: AROM;Left;5 reps;Seated Other Exercises Other Exercises: PROM RUE elbow flexion/extension and shoulder flexion, stretching Other Exercises: Worked on stretching of right hip rotators as pt favors right hip ER, difficult to get to neutral positioning   Shoulder Instructions       General Comments      Pertinent Vitals/ Pain       Pain Assessment: Faces Faces Pain Scale: Hurts little more Pain Location: grimacing with right hip stretching Pain Descriptors / Indicators: Grimacing;Discomfort Pain Intervention(s): Limited activity within patient's tolerance;Monitored during session;Repositioned  Home Living                                          Prior Functioning/Environment              Frequency  Min 2X/week        Progress Toward Goals  OT Goals(current goals can now be found in the care plan section)  Progress towards OT goals: Progressing toward goals  Acute Rehab OT Goals Patient Stated Goal: unable to state OT Goal Formulation: Patient unable to participate in goal setting Time For Goal Achievement: 06/25/20 Potential to Achieve Goals: Fair  Plan Discharge plan remains appropriate;Frequency remains appropriate     Co-evaluation                 AM-PAC OT "6 Clicks" Daily Activity     Outcome Measure   Help from another person eating meals?: Total Help from another person taking care of personal grooming?: A Lot Help from another person toileting, which includes using toliet, bedpan, or urinal?: Total Help from another person bathing (including washing, rinsing, drying)?: A Lot Help from another person to put on and taking off regular upper body clothing?: A Lot Help from another person to put on and taking off regular lower body clothing?: A Lot 6 Click Score: 10    End of Session    OT Visit Diagnosis: Other abnormalities of gait and mobility (R26.89);Muscle weakness (generalized) (M62.81);Low vision, both eyes (H54.2);Other symptoms and signs involving cognitive function;Hemiplegia and hemiparesis Hemiplegia - Right/Left: Right Hemiplegia - dominant/non-dominant: Dominant   Activity Tolerance Patient tolerated treatment well   Patient Left in bed;with call bell/phone within reach;with bed alarm set (semi-chari position, uncomfortable sitting fully upright)   Nurse Communication Mobility status;Other (comment) (up in chair position upon exit)        Time: 6720-9470 OT Time Calculation (min): 20 min  Charges: OT General Charges $OT Visit: 1 Visit OT Treatments $Therapeutic Activity: 8-22 mins  Pollyann Glen E. Solae Norling, COTA/L Acute Rehabilitation Services [phone]   Cherlyn Cushing 06/21/2020, 2:37 PM

## 2020-06-21 NOTE — Progress Notes (Signed)
Physical Therapy Treatment Patient Details Name: Alan Everett MRN: [id number] DOB: 1955/11/24 Today's Date: 06/21/2020    History of Present Illness Patient is a 64 y/o male with PMH ETOH use, tobacco abuse, admitted with R side weakness,sensory loss and speech deficits.  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    PT Comments    Patient progressing slowly towards PT goals. Pt with improved static and dynamic sitting balance, working on reaching outside BoS in all directions with LUE. Noted to have LOB when stretched too far and unable to self correct and maintain balance independently. Worked on standing in stedy frame with max cues for upright posture and hip extension with assist of 2. Minimal to no verbalizations this session. Continues to have right hip rotators/flexors RLE and cervical musculature as well as right neglect. Will follow.   Follow Up Recommendations  SNF     Equipment Recommendations  Wheelchair (measurements PT);Wheelchair cushion (measurements PT);Hospital bed;3in1 (PT)    Recommendations for Other Services       Precautions / Restrictions Precautions Precautions: Fall Precaution Comments: G tube, right neglect, prevalon boot Restrictions Weight Bearing Restrictions: No    Mobility  Bed Mobility Overal bed mobility: Needs Assistance Bed Mobility: Supine to Sit     Supine to sit: Max assist;HOB elevated;+2 for physical assistance Sit to supine: Max assist;+2 for physical assistance;HOB elevated   General bed mobility comments: Pt attempting to assist with progressing LLE to EOB, step by step cues to assist with reaching to rail with LUE, assist with trunk and scooting bottom to EOB.  Transfers Overall transfer level: Needs assistance Equipment used: Ambulation equipment used Transfers: Sit to/from Stand Sit to Stand: Mod assist;Max assist;+2 physical assistance;From elevated surface          General transfer comment: Worked on standing bouts from EOB using stedy with Mod-Max A of 2 with flexed trunk, knees flexed and difficulty elevating trunk despite tactile cues. Limited standing tolerance. Performed from EOB x4.  Ambulation/Gait             General Gait Details: Unable   Stairs             Wheelchair Mobility    Modified Rankin (Stroke Patients Only) Modified Rankin (Stroke Patients Only) Pre-Morbid Rankin Score: No symptoms Modified Rankin: Severe disability     Balance Overall balance assessment: Needs assistance Sitting-balance support: Feet supported;Single extremity supported Sitting balance-Leahy Scale: Fair Sitting balance - Comments: Able to sit EOB with 1 UE support and at times no UE support reaching for therapist outside BoS in all directions. Worked on cervical AROM and stretching to cervical musculature. Difficulty rotating neck to right. Postural control: Posterior lean;Right lateral lean Standing balance support: During functional activity Standing balance-Leahy Scale: Poor Standing balance comment: Requires hands on assist for standing instedy frame with max cues for upright posture and hip extension. Stood ~15-20 seconds each bout.                            Cognition Arousal/Alertness: Awake/alert Behavior During Therapy: WFL for tasks assessed/performed Overall Cognitive Status: Difficult to assess Area of Impairment: Attention;Following commands;Awareness;Safety/judgement;Problem solving                   Current Attention Level: Sustained   Following Commands: Follows one step commands with increased time;Follows one step commands inconsistently Safety/Judgement: Decreased awareness of safety;Decreased awareness of deficits  Awareness: Intellectual Problem Solving: Slow processing;Requires verbal cues;Requires tactile cues;Difficulty sequencing General Comments: pt with limited verbalization during  session. Pt required multimodal cues for attending to his right side, difficulty following commands consistently, does well with gestures, tactile cues.      Exercises General Exercises - Lower Extremity Long Arc Quad: AROM;Left;5 reps;Seated Other Exercises Other Exercises: PROM RUE elbow flexion/extension and shoulder flexion, stretching Other Exercises: Worked on stretching of right hip rotators as pt favors right hip ER, difficult to get to neutral positioning    General Comments        Pertinent Vitals/Pain Pain Assessment: Faces Faces Pain Scale: Hurts little more Pain Location: grimacing with right hip stretching Pain Descriptors / Indicators: Grimacing;Discomfort Pain Intervention(s): Monitored during session;Limited activity within patient's tolerance;Repositioned    Home Living                      Prior Function            PT Goals (current goals can now be found in the care plan section) Progress towards PT goals: Progressing toward goals (slowly)    Frequency    Min 3X/week      PT Plan Current plan remains appropriate    Co-evaluation              AM-PAC PT "6 Clicks" Mobility   Outcome Measure  Help needed turning from your back to your side while in a flat bed without using bedrails?: A Lot Help needed moving from lying on your back to sitting on the side of a flat bed without using bedrails?: A Lot Help needed moving to and from a bed to a chair (including a wheelchair)?: Total Help needed standing up from a chair using your arms (e.g., wheelchair or bedside chair)?: Total Help needed to walk in hospital room?: Total Help needed climbing 3-5 steps with a railing? : Total 6 Click Score: 8    End of Session Equipment Utilized During Treatment: Gait belt Activity Tolerance: Patient tolerated treatment well Patient left: in bed;with call bell/phone within reach;with bed alarm set;Other (comment) (with prevalon boot donned) Nurse  Communication: Mobility status PT Visit Diagnosis: Unsteadiness on feet (R26.81);Muscle weakness (generalized) (M62.81);Difficulty in walking, not elsewhere classified (R26.2);Other symptoms and signs involving the nervous system (R29.898);Hemiplegia and hemiparesis Hemiplegia - Right/Left: Right Hemiplegia - dominant/non-dominant: Dominant Hemiplegia - caused by: Nontraumatic intracerebral hemorrhage     Time: 0906-0927 PT Time Calculation (min) (ACUTE ONLY): 21 min  Charges:  $Therapeutic Activity: 8-22 mins                     [name], PT, DPT Acute Rehabilitation Services Pager [phone] Office [phone]       Blake Divine A Lanier Ensign 06/21/2020, 11:11 AM

## 2020-06-21 NOTE — Progress Notes (Signed)
  Speech Language Pathology Treatment: Dysphagia;Cognitive-Linquistic  Patient Details Name: Alan Everett MRN: [id number] DOB: 1955/12/06 Today's Date: 06/21/2020 Time: 5465-6812 SLP Time Calculation (min) (ACUTE ONLY): 10 min  Assessment / Plan / Recommendation Clinical Impression  Pt was seen for dysphagia and cognitive-linguistic treatment. He was observed with upgraded trials of POs (thin liquid and solid) and all were tolerated well. With solids, he demonstrated mild oral residue on the R side. With sips of thin liquid, the residue was successfully cleared. Recommend pt be upgraded to dys 2 diet and thin liquids. Monitor for residue on R side and encourage him to self-feed with L hand.  His attention continues to improve each session. Independently, his intelligibility at the phrase and sentence level remains at about 50%, and at about 80% at the word level. During the session he produced one-word utterances with direct modeling with 100% intelligibility. SLP will continue to f/u acutely.   HPI HPI: Alan Everett is a 64 y.o. male with a PMHx of alcohol use and tobacco abuse who presents acutely to the ED via EMS as a Code Stroke for acute onset of right sided weakness. Patient with disoriented, right leg weakness, right limb ataxia, right decreased sensation and Global aphasia on exam. 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 Worsening of intraventricular extension.  MBS 10/19 mod-severe dysphagia, continue NPO. PEG 10/22.  Repeat MBS 10/30 with improvements. Started dys1/nectar 11/2.       SLP Plan  Continue with current plan of care       Recommendations  Diet recommendations: Dysphagia 2 (fine chop);Thin liquid Liquids provided via: Cup;Straw Medication Administration: Via alternative means Supervision: Staff to assist with self feeding;Full supervision/cueing for compensatory strategies Compensations: Minimize environmental distractions;Monitor for anterior  loss Postural Changes and/or Swallow Maneuvers: Seated upright 90 degrees                Oral Care Recommendations: Oral care BID Follow up Recommendations: Skilled Nursing facility SLP Visit Diagnosis: Dysphagia, oropharyngeal phase (R13.12);Aphasia (R47.01) Plan: Continue with current plan of care       GO                [name] 06/21/2020, 1:43 PM

## 2020-06-21 NOTE — Progress Notes (Signed)
[facility] PROGRESS NOTE  [name] ZOX:[id number] DOB: 1956/06/27 DOA: 05/05/2020 PCP: Alan Everett, No Pcp Per    11/16   Status: Remains inpatient appropriate because:Altered mental status and Unsafe d/c plan   Dispo: The Alan Everett is from: Home              Anticipated d/c is to: SNF              Anticipated d/c date is: > 3 days              Alan Everett currently is medically stable to d/c.  No expectation of full recovery from neurological injury with recommendation for long-term care at skilled nursing facility.  Needs insurance approval, disability and likely Medicaid to ensure placement at SNF-still waiting on needs to complete Medicaid paperwork -11/19 TOC to follow-up with niece   Code Status: Full Family Communication: Alan Everett only-no family at bedside DVT prophylaxis: SCDs only-presented with intraventricular hemorrhage resulting in neurological injury Vaccination status: We will need to discuss with family if Alan Everett has previously received Covid vaccination  Foley catheter: No  HPI: 64 year old male Alan Everett with history of iron deficiency anemia, alcohol use, cocaine use and tobacco abuse.  Alan Everett presented to the ER by EMS on 10/6 as a code stroke.  At that time he was noted to have acute onset right-sided weakness, sensory loss and aphasia.  CT of the head revealed an enlarging left thalamic capsular intraparenchymal hemorrhage with increasing intraventricular hemorrhage from left ventricle into the third and fourth ventricles.  Neurosurgery consulted but no indication at that time for EVD.  Urine drug screen was found to be positive for cocaine.  Since admission hospital course has been complicated by pneumonia and recurrent blood loss anemia.  It has been noted that in the outpatient setting he had undergone EGD and colonoscopy on 8/2 at [facility] in [place].  No significant abnormalities identified and Alan Everett was recommended to continue previous iron.  He  continues to have issues regarding communication as evidenced by global aphasia.  He also has moderate to severe dysphagia and is requiring feedings via a PEG tube.  Subjective: Awake and alert.  Attempting to communicate verbally but unfortunately due to global aphasia unable to understand Alan Everett's speech.  Objective: Vitals:   06/21/20 0823 06/21/20 1121  BP: 125/65 139/71  Pulse: (!) 103 95  Resp: (!) 22 20  Temp: 98.9 F (37.2 C) 97.7 F (36.5 C)  SpO2: 100% 100%    Intake/Output Summary (Last 24 hours) at 06/21/2020 1211 Last data filed at 06/21/2020 0600 Gross per 24 hour  Intake 4395.79 ml  Output 425 ml  Net 3970.79 ml   Filed Weights   06/15/20 0407 06/17/20 0500 06/18/20 0453  Weight: 59.7 kg 59.3 kg 60 kg    Exam:  Constitutional: NAD, calm Respiratory: clear to auscultation bilaterally,Normal respiratory effort. RA Cardiovascular: Regular rate and rhythm, No extremity edema.  Skin warm and dry. Abdomen: no tenderness, Bowel sounds positive.  PEG tube recently unclogged and tube feeding has been resumed Musculoskeletal: RUE neglect/keeps extremity flexed upon chest-keeps RLE flexed at hip.  Skin: Buttock decubitus stable per nursing documentation Neurologic: CN 2-12 appears to be grossly intact except for notable global aphasia. Sensation intact, Strength 4/5 left side-right side with neglect and paralysis Psychiatric: Alert, responds to name.  Attempts to verbally communicate but limited due to global aphasia.     Assessment/Plan: Acute problems: Acute Hemorrhagic stroke/cerebral edema -Etiology 2/2 severe hypertension, alcohol and cocaine use. -  Because of IVH, neurosurgery was consulted. EVD was deferred.  -CT head: stable findings.  -He is alert and awake but has global aphasia, unable to follow commands. No change in neurological status > 3 weeks. -Not on antiplatelet tx secondary to hemorrhagic etiology. LDL 80. -Continue PT and OT.  Noted with  improved static and dynamic sitting balance.  Moderate to severe dysphagia 2/2 stroke/severe protein calorie malnutrition -Secondary to stroke. PEG tube placed on 05/21/2020. Nutrition Problem: Severe Malnutrition Etiology: social / environmental circumstances Signs/Symptoms: severe fat depletion, severe muscle depletion Interventions: Tube feeding (nocturnal)-dysphagia 1 diet poor intake, documented as only 25 to 45% of meals eaten over the past 3 days Estimated body mass index is 21.35 kg/m as calculated from the following:   Height as of 04/09/20: 5\' 6"  (1.676 m).   Weight as of this encounter: 60 kg.   Acute on chronic anemia -Total of 6 units of PRBC transfusions given in this admission. -Hemoglobin stable 8-9 g/dL -GI consulted. No acute indications for endoscopic evaluation and bleeding has resolved/Hgb stabilized -Alan Everett did have endoscopy 03/01/2020 as an outpatient with the following results: Colonoscopy-sigmoid diverticulosis EGD-normal esophagus, gastritis, normal duodenum and no other abnormal findings -Continue PPI twice daily. (See below regarding history of gastritis on recent EGD)  Stage II buttock decubitus Wound / Incision (Open or Dehisced) 05/21/20 Puncture Abdomen Left;Anterior;Upper G-tube insertion site  (Active)  Date First Assessed/Time First Assessed: 05/21/20 1533   Wound Type: Puncture  Location: Abdomen  Location Orientation: Left;Anterior;Upper  Wound Description (Comments): G-tube insertion site   Present on Admission: No    Assessments 05/21/2020  3:34 PM 06/20/2020  8:00 PM  Dressing Type Gauze (Comment);Tape dressing None  Dressing Changed New --  Dressing Status Clean;Dry;Intact --  Dressing Change Frequency PRN --  Site / Wound Assessment Clean;Dry Clean;Dry  Peri-wound Assessment Intact Intact  Margins -- Attached edges (approximated)  Closure None None  Drainage Amount None None  Treatment Cleansed Other (Comment)     No Linked orders to  display     Pressure Injury 06/02/20 Buttocks Right Stage 2 -  Partial thickness loss of dermis presenting as a shallow open injury with a red, pink wound bed without slough. 7.5 cm in lenth by 4.5 width  (Active)  Date First Assessed/Time First Assessed: 06/02/20 0800   Location: Buttocks  Location Orientation: Right  Staging: Stage 2 -  Partial thickness loss of dermis presenting as a shallow open injury with a red, pink wound bed without slough.  Wound Descri...    Assessments 06/02/2020  8:00 AM 06/20/2020  8:00 PM  Dressing Type -- Foam - Lift dressing to assess site every shift  Dressing -- Clean;Dry;Intact  Dressing Change Frequency -- PRN  Wound Length (cm) 7.5 cm --  Wound Width (cm) 4.5 cm --  Wound Surface Area (cm^2) 33.75 cm^2 --  Drainage Amount None --  Treatment Other (Comment) --     No Linked orders to display      Other problems: Goals of care -Poor neurological recovery so far.  Do not expect he will return to baseline level of functioning and likely will require long-term skilled nursing care. -Palliative care was consulted in the past. Alan Everett remains full code.   Bibasilar pneumonia -resolved -CT scan 10/12 showed bibasilar pneumonia.  -Treated with broad-spectrum antibiotics.  -WBC normalized. No longer having fever.  Klebsiella UTI  -resolved -Urine culture report from 10/10 with more than 1000 CFU per mL of Klebsiella pneumoniae.  -Completed  5-day course of IV Rocephin.   Sinus tachycardia Essential hypertension/grade 1 diastolic dysfunction grade 1 diastolic dysfunction -Heart rate and blood pressure both are stable on Coreg 3.125 mg daily. -Echocardiogram this admission revealed preserved LVEF with physiologic findings consistent with diastolic dysfunction  Hypernatremia  - resolved   Elevated liver enzymes -Right upper quadrant ultrasound-cholelithiasis without acute cholecystitis -Chronic mild elevation of liver enzymes  persist.  Tobacco Abuse -Cessation counseling this admission deferred in the context of abnormal neurological status and Alan Everett unable to participate  Cocaine abuse -Cessation counseling this admission deferred in the context of abnormal neurological status and Alan Everett unable to participate  GERD/recent gastritis on endoscopy August 2021 -PPI twice daily  Data Reviewed: Basic Metabolic Panel: Recent Labs  Lab 06/21/20 0205  NA 136  K 3.4*  CL 101  CO2 24  GLUCOSE 96  BUN 14  CREATININE 0.58*  CALCIUM 9.1   Liver Function Tests: No results for input(s): AST, ALT, ALKPHOS, BILITOT, PROT, ALBUMIN in the last 168 hours. No results for input(s): LIPASE, AMYLASE in the last 168 hours. No results for input(s): AMMONIA in the last 168 hours. CBC: Recent Labs  Lab 06/21/20 0205  WBC 11.9*  HGB 8.5*  HCT 27.5*  MCV 97.9  PLT 283   Cardiac Enzymes: No results for input(s): CKTOTAL, CKMB, CKMBINDEX, TROPONINI in the last 168 hours. BNP (last 3 results) Recent Labs    05/29/20 0409  BNP 23.2    ProBNP (last 3 results) No results for input(s): PROBNP in the last 8760 hours.  CBG: Recent Labs  Lab 06/20/20 1924 06/20/20 2343 06/21/20 0406 06/21/20 0758 06/21/20 1126  GLUCAP 93 108* 114* 161* 119*    No results found for this or any previous visit (from the past 240 hour(s)).   Studies: No results found.  Scheduled Meds: . carvedilol  6.25 mg Per Tube BID WC  . chlorhexidine  15 mL Mouth Rinse BID  . feeding supplement (OSMOLITE 1.5 CAL)  840 mL Per Tube Q24H  . feeding supplement (PROSource TF)  45 mL Per Tube TID  . free water  200 mL Per Tube Q4H  . insulin aspart  0-9 Units Subcutaneous Q4H  . insulin glargine  5 Units Subcutaneous Daily  . mouth rinse  15 mL Mouth Rinse q12n4p  . pantoprazole sodium  40 mg Per Tube BID  . sennosides  5 mL Per Tube BID  . sodium chloride flush  10-40 mL Intracatheter Q12H   Continuous Infusions:  Principal  Problem:   ICH (intracerebral hemorrhage) (HCC) Active Problems:   Polysubstance abuse (HCC)   Dysphagia due to recent cerebrovascular accident (CVA)   Hyperglycemia   Hypokalemia   Hypomagnesemia   Fever   SOB (shortness of breath)   Leukocytosis   Aspiration pneumonia of both lower lobes (HCC)   Palliative care by specialist   Goals of care, counseling/discussion   DNR (do not resuscitate) discussion   Protein-calorie malnutrition, severe   Heme positive stool   Acute lower UTI   Acute blood loss anemia   Pressure injury of skin   Consultants:  Neurology  Gastroenterology  Procedures:  Echocardiogram  Core track trach tube  PEG tube  Antibiotics: Anti-infectives (From admission, onward)   Start     Dose/Rate Route Frequency Ordered Stop   05/28/20 0900  cefTRIAXone (ROCEPHIN) 1 g in sodium chloride 0.9 % 100 mL IVPB        1 g 200 mL/hr over 30 Minutes Intravenous  Every 24 hours 05/28/20 0807 06/01/20 0855   05/21/20 1515  ceFAZolin (ANCEF) IVPB 2g/100 mL premix        2 g 200 mL/hr over 30 Minutes Intravenous To Radiology 05/21/20 1429 05/21/20 2100   05/14/20 1000  cefTRIAXone (ROCEPHIN) 2 g in sodium chloride 0.9 % 100 mL IVPB        2 g 200 mL/hr over 30 Minutes Intravenous Daily 05/13/20 1454 05/15/20 1255   05/13/20 2200  metroNIDAZOLE (FLAGYL) tablet 500 mg        500 mg Per Tube Every 8 hours 05/13/20 1847 05/15/20 2219   05/13/20 1400  metroNIDAZOLE (FLAGYL) tablet 500 mg  Status:  Discontinued        500 mg Oral Every 8 hours 05/13/20 0935 05/13/20 1847   05/12/20 0900  cefTRIAXone (ROCEPHIN) 1 g in sodium chloride 0.9 % 100 mL IVPB  Status:  Discontinued        1 g 200 mL/hr over 30 Minutes Intravenous Daily 05/12/20 0817 05/13/20 1454   05/12/20 0900  azithromycin (ZITHROMAX) 500 mg in sodium chloride 0.9 % 250 mL IVPB  Status:  Discontinued        500 mg 250 mL/hr over 60 Minutes Intravenous Daily 05/12/20 0817 05/13/20 0935   05/09/20 0930   Ampicillin-Sulbactam (UNASYN) 3 g in sodium chloride 0.9 % 100 mL IVPB  Status:  Discontinued        3 g 200 mL/hr over 30 Minutes Intravenous Every 8 hours 05/09/20 0837 05/09/20 0839   05/09/20 0930  Ampicillin-Sulbactam (UNASYN) 3 g in sodium chloride 0.9 % 100 mL IVPB  Status:  Discontinued        3 g 200 mL/hr over 30 Minutes Intravenous Every 6 hours 05/09/20 0839 05/12/20 0817       Time spent: 20 minutes    Junious Silk ANP  [facility] Pager [phone]. If 7PM-7AM, please contact night-coverage at [email or web address] 06/21/2020, 12:11 PM  LOS: 47 days

## 2020-06-22 LAB — GLUCOSE, CAPILLARY
Glucose-Capillary: 142 mg/dL — ABNORMAL HIGH (ref 70–99)
Glucose-Capillary: 155 mg/dL — ABNORMAL HIGH (ref 70–99)
Glucose-Capillary: 156 mg/dL — ABNORMAL HIGH (ref 70–99)
Glucose-Capillary: 104 mg/dL — ABNORMAL HIGH (ref 70–99)
Glucose-Capillary: 175 mg/dL — ABNORMAL HIGH (ref 70–99)

## 2020-06-22 MED ORDER — ENSURE ENLIVE PO LIQD
237.0000 mL | Freq: Three times a day (TID) | ORAL | Status: DC
  Administered 2020-06-23 – 2020-07-15 (×47): 237 mL via ORAL
  Filled 2020-06-22 (×6): qty 237

## 2020-06-22 NOTE — Progress Notes (Signed)
Physical Therapy Treatment Patient Details Name: Alan Everett MRN: [id number] DOB: 12-Aug-1955 Today's Date: 06/22/2020    History of Present Illness Patient is a 64 y/o male with PMH ETOH use, tobacco abuse, admitted with R side weakness,sensory loss and speech deficits.  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    PT Comments    Patient progressing slowly towards PT goals. More alert and verbal today however continues to have aphasia so difficult to understand. Worked on standing bouts using stedy with Mod-Max A of 2 for support; max hands on assist due to flexed posture at trunk/hips/knees. Limited standing tolerance with right lateral lean. Pt able to sit EOB today and wash face without LOB with no UE support. Continues to follow.    Follow Up Recommendations  SNF     Equipment Recommendations  Wheelchair (measurements PT);Wheelchair cushion (measurements PT);Hospital bed;3in1 (PT)    Recommendations for Other Services       Precautions / Restrictions Precautions Precautions: Fall Precaution Comments: G tube, right neglect, prevalon boot Restrictions Weight Bearing Restrictions: No    Mobility  Bed Mobility Overal bed mobility: Needs Assistance Bed Mobility: Rolling;Sidelying to Sit;Sit to Sidelying Rolling: Mod assist Sidelying to sit: HOB elevated;Max assist;+2 for physical assistance     Sit to sidelying: Max assist;+2 for physical assistance General bed mobility comments: Assist to roll onto right side for pericare; initially pt resistant due to pain in RLE but able to get in a position to assist with Mod A. Max A to elevate trunk, bring LEs to EOB and scoot bottom.  Transfers Overall transfer level: Needs assistance Equipment used: Ambulation equipment used Transfers: Sit to/from Stand Sit to Stand: Mod assist;+2 physical assistance;From elevated surface;Max assist         General transfer  comment: Worked on standing bouts from EOB using stedy with Mod-Max A of 2 with flexed trunk, knees and hips. Max manual cues for upright posture, right lateral lean. Limited standing tolerance. Performed from EOB x3.  Ambulation/Gait                 Stairs             Wheelchair Mobility    Modified Rankin (Stroke Patients Only) Modified Rankin (Stroke Patients Only) Pre-Morbid Rankin Score: No symptoms Modified Rankin: Severe disability     Balance Overall balance assessment: Needs assistance Sitting-balance support: Feet supported;Single extremity supported Sitting balance-Leahy Scale: Fair Sitting balance - Comments: Able to sit EOB with 1 UE support and at times no UE support, able to wash face without LOB or difficulty today. Worked on cervical AROM and stretching to cervical musculature. Postural control: Posterior lean;Right lateral lean                                  Cognition Arousal/Alertness: Awake/alert Behavior During Therapy: WFL for tasks assessed/performed Overall Cognitive Status: Difficult to assess Area of Impairment: Attention;Following commands;Awareness;Safety/judgement;Problem solving                   Current Attention Level: Sustained   Following Commands: Follows one step commands with increased time;Follows one step commands inconsistently   Awareness: Intellectual Problem Solving: Requires verbal cues;Requires tactile cues;Difficulty sequencing General Comments: Pt more verbal today but not able to comprehend due to aphasia. Requires multi modal cues to follow commands, limited.      Exercises Other  Exercises Other Exercises: PROM RUE elbow flexion/extension and shoulder flexion, stretching. Some tone noted in elbow flexors Other Exercises: Worked on stretching of right hip rotators as pt favors right hip ER, difficult to get to neutral positioning    General Comments        Pertinent Vitals/Pain Faces  Pain Scale: Hurts little more Pain Location: bottom, right hip with stretching Pain Descriptors / Indicators: Discomfort;Grimacing;Guarding Pain Intervention(s): Monitored during session;Repositioned;Limited activity within patient's tolerance    Home Living                      Prior Function            PT Goals (current goals can now be found in the care plan section) Progress towards PT goals: Progressing toward goals (slowly)    Frequency    Min 3X/week      PT Plan Current plan remains appropriate    Co-evaluation              AM-PAC PT "6 Clicks" Mobility   Outcome Measure  Help needed turning from your back to your side while in a flat bed without using bedrails?: A Lot Help needed moving from lying on your back to sitting on the side of a flat bed without using bedrails?: Total Help needed moving to and from a bed to a chair (including a wheelchair)?: Total Help needed standing up from a chair using your arms (e.g., wheelchair or bedside chair)?: Total Help needed to walk in hospital room?: Total Help needed climbing 3-5 steps with a railing? : Total 6 Click Score: 7    End of Session Equipment Utilized During Treatment: Gait belt Activity Tolerance: Patient tolerated treatment well Patient left: in bed;with call bell/phone within reach;with bed alarm set Nurse Communication: Mobility status;Need for lift equipment PT Visit Diagnosis: Unsteadiness on feet (R26.81);Muscle weakness (generalized) (M62.81);Difficulty in walking, not elsewhere classified (R26.2);Other symptoms and signs involving the nervous system (R29.898);Hemiplegia and hemiparesis Hemiplegia - Right/Left: Right Hemiplegia - dominant/non-dominant: Dominant Hemiplegia - caused by: Nontraumatic intracerebral hemorrhage     Time: 6389-3734 PT Time Calculation (min) (ACUTE ONLY): 23 min  Charges:  $Therapeutic Activity: 8-22 mins $Neuromuscular Re-education: 8-22 mins                      [name], PT, DPT Acute Rehabilitation Services Pager [phone] Office [phone]       Blake Divine A Lanier Ensign 06/22/2020, 12:26 PM

## 2020-06-22 NOTE — Progress Notes (Addendum)
Nutrition Follow-up  DOCUMENTATION CODES:   Severe malnutrition in context of social or environmental circumstances  INTERVENTION:  -Continue Osmolite 1.5 @ 57ml/hr via PEG for 12 hours from 1800-0600 with free water flushes per MD (currently Q4H) -Continue Prosource TF 45 ml TID via PEG.  -d/c Vital Cuisineshake po TID -Ensure Enlive po TID, each supplement provides 350 kcal and 20 grams of protein   Nocturnal TF regimen will provide 1380 kcals (meets 76% minimum calorie needs), 86 grams protein (meets 100% of minimum protein needs), free water.   -Staff to assist with feedings.    NUTRITION DIAGNOSIS:   Severe Malnutrition related to social / environmental circumstances as evidenced by severe fat depletion, severe muscle depletion.  ongoing  GOAL:   Patient will meet greater than or equal to 90% of their needs  progressing  MONITOR:   TF tolerance, Diet advancement, Weight trends, Skin  REASON FOR ASSESSMENT:   Consult, New TF Enteral/tube feeding initiation and management  ASSESSMENT:   64 yo male presents with acute onset of right sided weakness and mixed recept and expressive aphasia and admitted with acute left thalamocapsular ICH, severe HTN. PMH includes EtOH and tobacco abuse  10/06 Admitted 10/08 Cortrak placed(gastric) 10/13 repeat CT head showed stable L thalamic hemorrhageand intraventricular extension since 05/08/2020. Regional edema, mild regional mass-effect and mild lateral ventriculomegaly and trace SAH. 10/19 failed MBS 10/22 s/p PEG 11/22 diet advanced to dysphagia 2 with thin liquids  Pt pending placement. Pt continues to have difficulty expressing needs/wants due to global aphasia.   Pt's intake continues to be inadequate to meet estimated nutrition needs. Meal completion documented as 15-50% x last 7 recorded meals (35% average meal intake). Given pt's inadequate intake, pt has been receiving nocturnal TF to help meet needs  while stimulating appetite during the day. Current TF orders are for Osmolite 1.5 @ 58ml/hr for 12 hours from 1800-0600 with 2ml Prosource TF TID and free water Q4H. Pt has also been receiving Vital Cuisine shakes with meals to help meet kcal/protein needs; however, completion of these supplements has been varied. Now that pt's diet has been advanced, recommend discontinuing Vital Cuisine and ordering Ensure. Recommend continuing nocturnal TF regimen as described above until pt's po intake consistently improves.   Labs: K+ 3.4 (L), CBGs [id number] Medications: ss novolog Q4H, 5 units lantus daily, protonix, senokot  Diet Order:   Diet Order            DIET DYS 2 Room service appropriate? Yes; Fluid consistency: Thin  Diet effective now                 EDUCATION NEEDS:   Not appropriate for education at this time  Skin:  Skin Assessment: Reviewed RN Assessment Skin Integrity Issues:: Stage II, Other (Comment) Stage II: R buttocks Other: puncture abdomen  Last BM:  last record 11/16  Height:   Ht Readings from Last 1 Encounters:  04/09/20 5\' 6"  (1.676 m)    Weight:   Wt Readings from Last 1 Encounters:  06/18/20 60 kg   BMI:  Body mass index is 21.35 kg/m.  Estimated Nutritional Needs:   Kcal:  1780-1980 kcals  Protein:  85-95 g  Fluid:  >/= 1.8 L    06/20/20, MS, RD, LDN RD pager number and weekend/on-call pager number located in [place].

## 2020-06-22 NOTE — Progress Notes (Signed)
[facility] PROGRESS NOTE  Alan Everett GMW:[id number] DOB: 11-07-1955 DOA: 05/05/2020 PCP: Patient, No Pcp Per    11/16   Status: Remains inpatient appropriate because:Altered mental status and Unsafe d/c plan   Dispo: The patient is from: Home              Anticipated d/c is to: SNF              Anticipated d/c date is: > 3 days              Patient currently is medically stable to d/c.  No expectation of full recovery from neurological injury with recommendation for long-term care at skilled nursing facility.  Needs insurance approval, disability and likely Medicaid to ensure placement at SNF-still waiting on needs to complete Medicaid paperwork -11/19 TOC to follow-up with niece   Code Status: Full Family Communication: Patient only-no family at bedside DVT prophylaxis: SCDs only-presented with intraventricular hemorrhage resulting in neurological injury Vaccination status: We will need to discuss with family if patient has previously received Covid vaccination  Foley catheter: No  HPI: 64 year old male patient with history of iron deficiency anemia, alcohol use, cocaine use and tobacco abuse.  Patient presented to the ER by EMS on 10/6 as a code stroke.  At that time he was noted to have acute onset right-sided weakness, sensory loss and aphasia.  CT of the head revealed an enlarging left thalamic capsular intraparenchymal hemorrhage with increasing intraventricular hemorrhage from left ventricle into the third and fourth ventricles.  Neurosurgery consulted but no indication at that time for EVD.  Urine drug screen was found to be positive for cocaine.  Since admission hospital course has been complicated by pneumonia and recurrent blood loss anemia.  It has been noted that in the outpatient setting he had undergone EGD and colonoscopy on 8/2 at [facility] in [place].  No significant abnormalities identified and patient was recommended to continue previous iron.  He  continues to have issues regarding communication as evidenced by global aphasia.  He also has moderate to severe dysphagia and is requiring feedings via a PEG tube.  Subjective: Awake and alert.  Slightly agitated at staff attempting to provide bedside care.  Continues to have difficulty expressing wants and needs secondary to global aphasia.  Objective: Vitals:   06/22/20 0825 06/22/20 1148  BP: 118/61 105/61  Pulse: (!) 102 86  Resp: 18 18  Temp: 98.4 F (36.9 C) 98.3 F (36.8 C)  SpO2: 100% 100%    Intake/Output Summary (Last 24 hours) at 06/22/2020 1155 Last data filed at 06/21/2020 1856 Gross per 24 hour  Intake 470 ml  Output --  Net 470 ml   Filed Weights   06/15/20 0407 06/17/20 0500 06/18/20 0453  Weight: 59.7 kg 59.3 kg 60 kg    Exam:  Constitutional: NAD, mildly anxious but easily redirected Respiratory: clear to auscultation bilaterally,Normal respiratory effort. RA Cardiovascular: Regular rate and rhythm, Skin warm and dry.  No peripheral edema. Abdomen: no tenderness, Bowel sounds positive.  PEG tube tube feeding Musculoskeletal: RUE neglect/keeps extremity flexed upon chest-keeps RLE flexed at hip.  Skin: Buttock decubitus stable per nursing documentation Neurologic: CN 2-12 appears to be grossly intact except for notable global aphasia. Sensation intact, Strength 4/5 left side-right side with neglect and paralysis Psychiatric: Alert, responds to name.  Attempts to verbally communicate but limited due to global aphasia.     Assessment/Plan: Acute problems: Acute Hemorrhagic stroke/cerebral edema -Etiology 2/2 severe hypertension, alcohol  and cocaine use. -Because of IVH, neurosurgery was consulted. EVD was deferred.  -CT head: stable findings.  -Persistent global aphasia, unable to consistently follow commands.  -Not on antiplatelet tx secondary to hemorrhagic etiology. LDL 80. -Continue PT and OT.  Noted with improved static and dynamic sitting  balance.  Moderate to severe dysphagia 2/2 stroke/severe protein calorie malnutrition -Secondary to stroke. PEG tube placed on 05/21/2020. Nutrition Problem: Severe Malnutrition Etiology: social / environmental circumstances Signs/Symptoms: severe fat depletion, severe muscle depletion Interventions: Tube feeding (nocturnal)-dysphagia 1 diet poor intake, documented as only 25 to 45% of meals eaten over the past 3 days Estimated body mass index is 21.35 kg/m as calculated from the following:   Height as of 04/09/20: 5\' 6"  (1.676 m).   Weight as of this encounter: 60 kg.   Acute on chronic anemia -Total of 6 units of PRBC transfusions given in this admission current hemoglobin stable between 8 and 9. -GI consulted. -Patient s/p panendoscopy 03/01/2020 as an outpatient with the following results: Colonoscopy-sigmoid diverticulosis EGD-normal esophagus, gastritis, normal duodenum and no other abnormal findings -Continue PPI twice daily. (See below regarding history of gastritis on recent EGD)  Stage II buttock decubitus Wound / Incision (Open or Dehisced) 05/21/20 Puncture Abdomen Left;Anterior;Upper G-tube insertion site  (Active)  Date First Assessed/Time First Assessed: 05/21/20 1533   Wound Type: Puncture  Location: Abdomen  Location Orientation: Left;Anterior;Upper  Wound Description (Comments): G-tube insertion site   Present on Admission: No    Assessments 05/21/2020  3:34 PM 06/20/2020  8:00 PM  Dressing Type Gauze (Comment);Tape dressing None  Dressing Changed New --  Dressing Status Clean;Dry;Intact --  Dressing Change Frequency PRN --  Site / Wound Assessment Clean;Dry Clean;Dry  Peri-wound Assessment Intact Intact  Margins -- Attached edges (approximated)  Closure None None  Drainage Amount None None  Treatment Cleansed Other (Comment)     No Linked orders to display     Pressure Injury 06/02/20 Buttocks Right Stage 2 -  Partial thickness loss of dermis presenting as a  shallow open injury with a red, pink wound bed without slough. 7.5 cm in lenth by 4.5 width  (Active)  Date First Assessed/Time First Assessed: 06/02/20 0800   Location: Buttocks  Location Orientation: Right  Staging: Stage 2 -  Partial thickness loss of dermis presenting as a shallow open injury with a red, pink wound bed without slough.  Wound Descri...    Assessments 06/02/2020  8:00 AM 06/21/2020  8:00 PM  Dressing Type -- Foam - Lift dressing to assess site every shift  Dressing -- Dry;Intact;Changed  Dressing Change Frequency -- Every 3 days  State of Healing -- Fully granulated  Site / Wound Assessment -- Clean;Dry  % Wound base Red or Granulating -- 100%  % Wound base Yellow/Fibrinous Exudate -- 0%  % Wound base Black/Eschar -- 0%  % Wound base Other/Granulation Tissue (Comment) -- 100%  Peri-wound Assessment -- Intact  Wound Length (cm) 7.5 cm --  Wound Width (cm) 4.5 cm --  Wound Surface Area (cm^2) 33.75 cm^2 --  Drainage Amount None --  Treatment Other (Comment) --     No Linked orders to display      Other problems: Goals of care -Poor neurological recovery so far.  Do not expect he will return to baseline level of functioning and likely will require long-term skilled nursing care. -Palliative care was consulted in the past. Patient remains full code.   Bibasilar pneumonia -resolved -CT scan 10/12 showed  bibasilar pneumonia.  -Treated with broad-spectrum antibiotics.  -WBC normalized. No longer having fever.  Klebsiella UTI  -resolved -Urine culture report from 10/10 with more than 1000 CFU per mL of Klebsiella pneumoniae.  -Completed 5-day course of IV Rocephin.   Sinus tachycardia Essential hypertension/grade 1 diastolic dysfunction grade 1 diastolic dysfunction -Heart rate and blood pressure both are stable on Coreg 3.125 mg daily. -Echocardiogram this admission revealed preserved LVEF with physiologic findings consistent with diastolic  dysfunction  Hypernatremia  - resolved   Elevated liver enzymes -Right upper quadrant ultrasound-cholelithiasis without acute cholecystitis -Chronic mild elevation of liver enzymes persist.  Tobacco Abuse -Cessation counseling this admission deferred in the context of abnormal neurological status and patient unable to participate  Cocaine abuse -Cessation counseling this admission deferred in the context of abnormal neurological status and patient unable to participate  GERD/recent gastritis on endoscopy August 2021 -PPI twice daily  Data Reviewed: Basic Metabolic Panel: Recent Labs  Lab 06/21/20 0205  NA 136  K 3.4*  CL 101  CO2 24  GLUCOSE 96  BUN 14  CREATININE 0.58*  CALCIUM 9.1   Liver Function Tests: No results for input(s): AST, ALT, ALKPHOS, BILITOT, PROT, ALBUMIN in the last 168 hours. No results for input(s): LIPASE, AMYLASE in the last 168 hours. No results for input(s): AMMONIA in the last 168 hours. CBC: Recent Labs  Lab 06/21/20 0205  WBC 11.9*  HGB 8.5*  HCT 27.5*  MCV 97.9  PLT 283   Cardiac Enzymes: No results for input(s): CKTOTAL, CKMB, CKMBINDEX, TROPONINI in the last 168 hours. BNP (last 3 results) Recent Labs    05/29/20 0409  BNP 23.2    ProBNP (last 3 results) No results for input(s): PROBNP in the last 8760 hours.  CBG: Recent Labs  Lab 06/21/20 2157 06/21/20 2333 06/22/20 0342 06/22/20 0824 06/22/20 1149  GLUCAP 105* 101* 142* 155* 156*    No results found for this or any previous visit (from the past 240 hour(s)).   Studies: No results found.  Scheduled Meds:  carvedilol  6.25 mg Per Tube BID WC   chlorhexidine  15 mL Mouth Rinse BID   feeding supplement (OSMOLITE 1.5 CAL)  840 mL Per Tube Q24H   feeding supplement (PROSource TF)  45 mL Per Tube TID   free water  200 mL Per Tube Q4H   insulin aspart  0-9 Units Subcutaneous Q4H   insulin glargine  5 Units Subcutaneous Daily   mouth rinse  15 mL  Mouth Rinse q12n4p   pantoprazole sodium  40 mg Per Tube BID   sennosides  5 mL Per Tube BID   sodium chloride flush  10-40 mL Intracatheter Q12H   Continuous Infusions:  Principal Problem:   ICH (intracerebral hemorrhage) (HCC) Active Problems:   Polysubstance abuse (HCC)   Dysphagia due to recent cerebrovascular accident (CVA)   Hyperglycemia   Hypokalemia   Hypomagnesemia   Fever   SOB (shortness of breath)   Leukocytosis   Aspiration pneumonia of both lower lobes (HCC)   Palliative care by specialist   Goals of care, counseling/discussion   DNR (do not resuscitate) discussion   Protein-calorie malnutrition, severe   Heme positive stool   Acute lower UTI   Acute blood loss anemia   Pressure injury of skin   Consultants:  Neurology  Gastroenterology  Procedures:  Echocardiogram  Core track trach tube  PEG tube  Antibiotics: Anti-infectives (From admission, onward)   Start     Dose/Rate  Route Frequency Ordered Stop   05/28/20 0900  cefTRIAXone (ROCEPHIN) 1 g in sodium chloride 0.9 % 100 mL IVPB        1 g 200 mL/hr over 30 Minutes Intravenous Every 24 hours 05/28/20 0807 06/01/20 0855   05/21/20 1515  ceFAZolin (ANCEF) IVPB 2g/100 mL premix        2 g 200 mL/hr over 30 Minutes Intravenous To Radiology 05/21/20 1429 05/21/20 2100   05/14/20 1000  cefTRIAXone (ROCEPHIN) 2 g in sodium chloride 0.9 % 100 mL IVPB        2 g 200 mL/hr over 30 Minutes Intravenous Daily 05/13/20 1454 05/15/20 1255   05/13/20 2200  metroNIDAZOLE (FLAGYL) tablet 500 mg        500 mg Per Tube Every 8 hours 05/13/20 1847 05/15/20 2219   05/13/20 1400  metroNIDAZOLE (FLAGYL) tablet 500 mg  Status:  Discontinued        500 mg Oral Every 8 hours 05/13/20 0935 05/13/20 1847   05/12/20 0900  cefTRIAXone (ROCEPHIN) 1 g in sodium chloride 0.9 % 100 mL IVPB  Status:  Discontinued        1 g 200 mL/hr over 30 Minutes Intravenous Daily 05/12/20 0817 05/13/20 1454   05/12/20 0900   azithromycin (ZITHROMAX) 500 mg in sodium chloride 0.9 % 250 mL IVPB  Status:  Discontinued        500 mg 250 mL/hr over 60 Minutes Intravenous Daily 05/12/20 0817 05/13/20 0935   05/09/20 0930  Ampicillin-Sulbactam (UNASYN) 3 g in sodium chloride 0.9 % 100 mL IVPB  Status:  Discontinued        3 g 200 mL/hr over 30 Minutes Intravenous Every 8 hours 05/09/20 0837 05/09/20 0839   05/09/20 0930  Ampicillin-Sulbactam (UNASYN) 3 g in sodium chloride 0.9 % 100 mL IVPB  Status:  Discontinued        3 g 200 mL/hr over 30 Minutes Intravenous Every 6 hours 05/09/20 0839 05/12/20 0817       Time spent: 20 minutes    Junious Silk ANP  [facility] Pager [phone]. If 7PM-7AM, please contact night-coverage at [email or web address] 06/22/2020, 11:55 AM  LOS: 48 days

## 2020-06-22 NOTE — Plan of Care (Signed)
  Problem: Safety: Goal: Ability to remain free from injury will improve Outcome: Progressing   Problem: Education: Goal: Knowledge of disease or condition will improve Outcome: Progressing Goal: Knowledge of secondary prevention will improve Outcome: Progressing Goal: Knowledge of patient specific risk factors addressed and post discharge goals established will improve Outcome: Progressing Goal: Individualized Educational Video(s) Outcome: Progressing   Problem: Intracerebral Hemorrhage Tissue Perfusion: Goal: Complications of Intracerebral Hemorrhage will be minimized Outcome: Progressing

## 2020-06-23 LAB — GLUCOSE, CAPILLARY
Glucose-Capillary: 107 mg/dL — ABNORMAL HIGH (ref 70–99)
Glucose-Capillary: 115 mg/dL — ABNORMAL HIGH (ref 70–99)
Glucose-Capillary: 122 mg/dL — ABNORMAL HIGH (ref 70–99)
Glucose-Capillary: 123 mg/dL — ABNORMAL HIGH (ref 70–99)
Glucose-Capillary: 127 mg/dL — ABNORMAL HIGH (ref 70–99)
Glucose-Capillary: 141 mg/dL — ABNORMAL HIGH (ref 70–99)

## 2020-06-23 NOTE — Progress Notes (Signed)
  Speech Language Pathology Treatment: Cognitive-Linquistic;Dysphagia  Patient Details Name: Alan Everett MRN: [id number] DOB: 07-11-1956 Today's Date: 06/23/2020 Time: 0853-0909 SLP Time Calculation (min) (ACUTE ONLY): 16 min  Assessment / Plan / Recommendation Clinical Impression  Nice session with [place] this am. Attentive throughout, able to repeat two word phrases to make requests and name items in functional context x5. Followed one step commands with visual context x10. Made verbal choices in a set of 2 x3 with a verbal model.  Attending to right arm visually and functionally for 2 minutes while applying lotion. Pts tolerance for participation is improving.Tolerating a mech soft diet and thin liquids with minimal right pocketing that he initiates clearance of with min verbal cues. He could potentially benefit from CIR consult at this time given improvements in therapy. They evaluated in October and asked to be re consulted if pts arousal and participation improves. Will discuss with PT/OT for their thoughts.     HPI HPI: Lamontae Ricardo is a 64 y.o. male with a PMHx of alcohol use and tobacco abuse who presents acutely to the ED via EMS as a Code Stroke for acute onset of right sided weakness. Patient with disoriented, right leg weakness, right limb ataxia, right decreased sensation and Global aphasia on exam. 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 Worsening of intraventricular extension.  MBS 10/19 mod-severe dysphagia, continue NPO. PEG 10/22.  Repeat MBS 10/30 with improvements. Started dys1/nectar 11/2.       SLP Plan  Continue with current plan of care       Recommendations  Diet recommendations: Dysphagia 3 (mechanical soft);Thin liquid Liquids provided via: Cup;Straw Medication Administration: Whole meds with puree Supervision: Staff to assist with self feeding Compensations: Minimize environmental distractions;Monitor for anterior loss Postural Changes and/or  Swallow Maneuvers: Seated upright 90 degrees                General recommendations: Rehab consult Oral Care Recommendations: Oral care BID SLP Visit Diagnosis: Dysphagia, oropharyngeal phase (R13.12);Aphasia (R47.01) Plan: Continue with current plan of care       GO               Harlon Ditty, MA CCC-SLP  [facility] Pager [phone] Office [phone]  Claudine Mouton 06/23/2020, 9:19 AM

## 2020-06-23 NOTE — Progress Notes (Addendum)
[facility] PROGRESS NOTE  Alan Everett ZOX:[id number] DOB: [birth year] DOA: 05/05/2020 PCP: Patient, No Pcp Per    11/16   Status: Remains inpatient appropriate because:Altered mental status and Unsafe d/c plan   Dispo: The patient is from: Home              Anticipated d/c is to: SNF vs home with family and home health services              Anticipated d/c date is: > 3 days              Patient currently is medically stable to d/c.  No expectation of full recovery from neurological injury with recommendation for long-term care at skilled nursing facility.  Needs insurance approval, disability and likely Medicaid to ensure placement at SNF-still waiting on needs to complete Medicaid paperwork -11/19 TOC to follow-up with niece  **SLP documenting patient with significant improvement in participation with therapies and will d/w PT/OT but consideration should be given to CIR as a possible discharge option-11/24 spoke with niece who states family would prefer to take patient home if it is a reasonable expectation from a therapy standpoint.   Code Status: Full Family Communication: Patient only-no family at bedside DVT prophylaxis: SCDs only-presented with intraventricular hemorrhage resulting in neurological injury Vaccination status: We will need to discuss with family if patient has previously received Covid vaccination  Foley catheter: No  HPI: 64 year old male patient with history of iron deficiency anemia, alcohol use, cocaine use and tobacco abuse.  Patient presented to the ER by EMS on 10/6 as a code stroke.  At that time he was noted to have acute onset right-sided weakness, sensory loss and aphasia.  CT of the head revealed an enlarging left thalamic capsular intraparenchymal hemorrhage with increasing intraventricular hemorrhage from left ventricle into the third and fourth ventricles.  Neurosurgery consulted but no indication at that time for EVD.  Urine drug screen was found  to be positive for cocaine.  Since admission hospital course has been complicated by pneumonia and recurrent blood loss anemia.  It has been noted that in the outpatient setting he had undergone EGD and [facility] on [facility] [place].  No significant abnormalities identified and patient was recommended to continue previous iron.  He continues to have issues regarding communication as evidenced by global aphasia.  He also has moderate to severe dysphagia and is requiring feedings via a PEG tube.  Subjective: Awakened.  Marked improvement noted and patient understanding of commands and words spoken to him.  Today I asked him "is Loraine LericheMark your name" he stated "no".  Unable to state his name but when I asked him "is your name Hanley BenJiles" he smiled and reported "yes".  I then touched his left arm and asked him as this your left arm and he stated "yes".  Objective: Vitals:   06/23/20 0851 06/23/20 1204  BP: 112/60 102/73  Pulse: 83 83  Resp: 18 18  Temp: 97.8 F (36.6 C) 98.5 F (36.9 C)  SpO2: 100% 100%    Intake/Output Summary (Last 24 hours) at 06/23/2020 1213 Last data filed at 06/23/2020 0440 Gross per 24 hour  Intake 65 ml  Output 700 ml  Net -635 ml   Filed Weights   06/15/20 0407 06/17/20 0500 06/18/20 0453  Weight: 59.7 kg 59.3 kg 60 kg    Exam:  Constitutional: NAD, calm and appears to be comfortable Respiratory: clear to auscultation bilaterally, No increased work of  breathing. RA Cardiovascular: Regular rate and rhythm, adequate capillary refill no peripheral edema. Abdomen: no tenderness, Bowel sounds positive.  PEG tube tube feeding Musculoskeletal: RUE neglect/keeps extremity flexed upon chest-keeps RLE flexed at hip.  Skin: Buttock decubitus stable per nursing documentation Neurologic: CN 2-12 appears to be grossly intact except for notable global aphasia. Sensation intact, Strength 4/5 left side-right side with neglect and paralysis.  Noted improvement in  receptive understanding of words spoken to him. Psychiatric: Alert, responds to name.  Attempts to verbally communicate but limited due to global aphasia.     Assessment/Plan: Acute problems: Acute Hemorrhagic stroke/cerebral edema -Etiology 2/2 severe hypertension, alcohol and cocaine use. -Because of IVH, neurosurgery was consulted. EVD was deferred.  -CT head: stable findings.  -Persistent global aphasia, unable to consistently follow commands.  -Not on antiplatelet tx secondary to hemorrhagic etiology. LDL 80. -Continue PT and OT.  Noted with improved static and dynamic sitting balance. -May be a candidate for CIR if family can arrange for 24/7 care after discharge  Moderate to severe dysphagia 2/2 stroke/severe protein calorie malnutrition -Secondary to stroke. PEG tube placed on 05/21/2020. Nutrition Problem: Severe Malnutrition Etiology: social / environmental circumstances Signs/Symptoms: severe fat depletion, severe muscle depletion Interventions: Tube feeding currently tolerating mechanical soft diet with thin liquids intake.  Also on nocturnal tube feedings. Estimated body mass index is 21.35 kg/m as calculated from the following:   Height as of 04/09/20: 5\' 6"  (1.676 m).   Weight as of this encounter: 60 kg.   Acute on chronic anemia -Total of 6 units of PRBC transfusions given in this admission current hemoglobin stable between 8 and 9. -GI consulted. -Patient s/p panendoscopy 03/01/2020 as an outpatient with the following results: Colonoscopy-sigmoid diverticulosis EGD-normal esophagus, gastritis, normal duodenum and no other abnormal findings -Continue PPI twice daily. (See below regarding history of gastritis on recent EGD)  Stage II buttock decubitus Wound / Incision (Open or Dehisced) 05/21/20 Puncture Abdomen Left;Anterior;Upper G-tube insertion site  (Active)  Date First Assessed/Time First Assessed: 05/21/20 1533   Wound Type: Puncture  Location: Abdomen   Location Orientation: Left;Anterior;Upper  Wound Description (Comments): G-tube insertion site   Present on Admission: No    Assessments 05/21/2020  3:34 PM 06/20/2020  8:00 PM  Dressing Type Gauze (Comment);Tape dressing None  Dressing Changed New --  Dressing Status Clean;Dry;Intact --  Dressing Change Frequency PRN --  Site / Wound Assessment Clean;Dry Clean;Dry  Peri-wound Assessment Intact Intact  Margins -- Attached edges (approximated)  Closure None None  Drainage Amount None None  Treatment Cleansed Other (Comment)     No Linked orders to display     Pressure Injury 06/02/20 Buttocks Right Stage 2 -  Partial thickness loss of dermis presenting as a shallow open injury with a red, pink wound bed without slough. 7.5 cm in lenth by 4.5 width  (Active)  Date First Assessed/Time First Assessed: 06/02/20 0800   Location: Buttocks  Location Orientation: Right  Staging: Stage 2 -  Partial thickness loss of dermis presenting as a shallow open injury with a red, pink wound bed without slough.  Wound Descri...    Assessments 06/02/2020  8:00 AM 06/22/2020  8:00 PM  Dressing Type -- Foam - Lift dressing to assess site every shift  Dressing -- Dry;Intact  Dressing Change Frequency -- Daily  Site / Wound Assessment -- Clean;Dry  Wound Length (cm) 7.5 cm --  Wound Width (cm) 4.5 cm --  Wound Surface Area (cm^2) 33.75 cm^2 --  Drainage Amount None None  Treatment Other (Comment) --     No Linked orders to display      Other problems: Goals of care -Poor neurological recovery so far.  Do not expect he will return to baseline level of functioning and likely will require long-term skilled nursing care. -Palliative care was consulted in the past. Patient remains full code.   Bibasilar pneumonia -resolved -CT scan 10/12 showed bibasilar pneumonia.  -Treated with broad-spectrum antibiotics.  -WBC normalized. No longer having fever.  Klebsiella UTI  -resolved -Urine culture report  from 10/10 with more than 1000 CFU per mL of Klebsiella pneumoniae.  -Completed 5-day course of IV Rocephin.   Sinus tachycardia Essential hypertension/grade 1 diastolic dysfunction grade 1 diastolic dysfunction -Heart rate and blood pressure both are stable on Coreg 3.125 mg daily. -Echocardiogram this admission revealed preserved LVEF with physiologic findings consistent with diastolic dysfunction  Hypernatremia  - resolved   Elevated liver enzymes -Right upper quadrant ultrasound-cholelithiasis without acute cholecystitis -Chronic mild elevation of liver enzymes persist.  Tobacco Abuse -Cessation counseling this admission deferred in the context of abnormal neurological status and patient unable to participate  Cocaine abuse -Cessation counseling this admission deferred in the context of abnormal neurological status and patient unable to participate  GERD/recent gastritis on endoscopy August 2021 -PPI twice daily  Data Reviewed: Basic Metabolic Panel: Recent Labs  Lab 06/21/20 0205  NA 136  K 3.4*  CL 101  CO2 24  GLUCOSE 96  BUN 14  CREATININE 0.58*  CALCIUM 9.1   Liver Function Tests: No results for input(s): AST, ALT, ALKPHOS, BILITOT, PROT, ALBUMIN in the last 168 hours. No results for input(s): LIPASE, AMYLASE in the last 168 hours. No results for input(s): AMMONIA in the last 168 hours. CBC: Recent Labs  Lab 06/21/20 0205  WBC 11.9*  HGB 8.5*  HCT 27.5*  MCV 97.9  PLT 283   Cardiac Enzymes: No results for input(s): CKTOTAL, CKMB, CKMBINDEX, TROPONINI in the last 168 hours. BNP (last 3 results) Recent Labs    05/29/20 0409  BNP 23.2    ProBNP (last 3 results) No results for input(s): PROBNP in the last 8760 hours.  CBG: Recent Labs  Lab 06/22/20 1959 06/23/20 0024 06/23/20 0445 06/23/20 0851 06/23/20 1205  GLUCAP 175* 127* 141* 115* 123*    No results found for this or any previous visit (from the past 240 hour(s)).    Studies: No results found.  Scheduled Meds: . carvedilol  6.25 mg Per Tube BID WC  . chlorhexidine  15 mL Mouth Rinse BID  . feeding supplement  237 mL Oral TID BM  . feeding supplement (OSMOLITE 1.5 CAL)  840 mL Per Tube Q24H  . feeding supplement (PROSource TF)  45 mL Per Tube TID  . free water  200 mL Per Tube Q4H  . insulin aspart  0-9 Units Subcutaneous Q4H  . insulin glargine  5 Units Subcutaneous Daily  . mouth rinse  15 mL Mouth Rinse q12n4p  . pantoprazole sodium  40 mg Per Tube BID  . sennosides  5 mL Per Tube BID  . sodium chloride flush  10-40 mL Intracatheter Q12H   Continuous Infusions:  Principal Problem:   ICH (intracerebral hemorrhage) (HCC) Active Problems:   Polysubstance abuse (HCC)   Dysphagia due to recent cerebrovascular accident (CVA)   Hyperglycemia   Hypokalemia   Hypomagnesemia   Fever   SOB (shortness of breath)   Leukocytosis   Aspiration pneumonia of  both lower lobes (HCC)   Palliative care by specialist   Goals of care, counseling/discussion   DNR (do not resuscitate) discussion   Protein-calorie malnutrition, severe   Heme positive stool   Acute lower UTI   Acute blood loss anemia   Pressure injury of skin   Consultants:  Neurology  Gastroenterology  Procedures:  Echocardiogram  Core track trach tube  PEG tube  Antibiotics: Anti-infectives (From admission, onward)   Start     Dose/Rate Route Frequency Ordered Stop   05/28/20 0900  cefTRIAXone (ROCEPHIN) 1 g in sodium chloride 0.9 % 100 mL IVPB        1 g 200 mL/hr over 30 Minutes Intravenous Every 24 hours 05/28/20 0807 06/01/20 0855   05/21/20 1515  ceFAZolin (ANCEF) IVPB 2g/100 mL premix        2 g 200 mL/hr over 30 Minutes Intravenous To Radiology 05/21/20 1429 05/21/20 2100   05/14/20 1000  cefTRIAXone (ROCEPHIN) 2 g in sodium chloride 0.9 % 100 mL IVPB        2 g 200 mL/hr over 30 Minutes Intravenous Daily 05/13/20 1454 05/15/20 1255   05/13/20 2200   metroNIDAZOLE (FLAGYL) tablet 500 mg        500 mg Per Tube Every 8 hours 05/13/20 1847 05/15/20 2219   05/13/20 1400  metroNIDAZOLE (FLAGYL) tablet 500 mg  Status:  Discontinued        500 mg Oral Every 8 hours 05/13/20 0935 05/13/20 1847   05/12/20 0900  cefTRIAXone (ROCEPHIN) 1 g in sodium chloride 0.9 % 100 mL IVPB  Status:  Discontinued        1 g 200 mL/hr over 30 Minutes Intravenous Daily 05/12/20 0817 05/13/20 1454   05/12/20 0900  azithromycin (ZITHROMAX) 500 mg in sodium chloride 0.9 % 250 mL IVPB  Status:  Discontinued        500 mg 250 mL/hr over 60 Minutes Intravenous Daily 05/12/20 0817 05/13/20 0935   05/09/20 0930  Ampicillin-Sulbactam (UNASYN) 3 g in sodium chloride 0.9 % 100 mL IVPB  Status:  Discontinued        3 g 200 mL/hr over 30 Minutes Intravenous Every 8 hours 05/09/20 0837 05/09/20 0839   05/09/20 0930  Ampicillin-Sulbactam (UNASYN) 3 g in sodium chloride 0.9 % 100 mL IVPB  Status:  Discontinued        3 g 200 mL/hr over 30 Minutes Intravenous Every 6 hours 05/09/20 0839 05/12/20 0817       Time spent: 20 minutes    Junious Silk ANP  [facility] Pager [phone]. If 7PM-7AM, please contact night-coverage at [email or web address] 06/23/2020, 12:13 PM  LOS: 49 days

## 2020-06-24 LAB — GLUCOSE, CAPILLARY
Glucose-Capillary: 113 mg/dL — ABNORMAL HIGH (ref 70–99)
Glucose-Capillary: 113 mg/dL — ABNORMAL HIGH (ref 70–99)
Glucose-Capillary: 122 mg/dL — ABNORMAL HIGH (ref 70–99)
Glucose-Capillary: 131 mg/dL — ABNORMAL HIGH (ref 70–99)
Glucose-Capillary: 143 mg/dL — ABNORMAL HIGH (ref 70–99)
Glucose-Capillary: 93 mg/dL (ref 70–99)
Glucose-Capillary: 97 mg/dL (ref 70–99)

## 2020-06-24 NOTE — Progress Notes (Signed)
Patient on the LLS team.  No overnight events.  Continue to work on placement. [name] DO

## 2020-06-25 LAB — GLUCOSE, CAPILLARY
Glucose-Capillary: 138 mg/dL — ABNORMAL HIGH (ref 70–99)
Glucose-Capillary: 139 mg/dL — ABNORMAL HIGH (ref 70–99)
Glucose-Capillary: 133 mg/dL — ABNORMAL HIGH (ref 70–99)
Glucose-Capillary: 130 mg/dL — ABNORMAL HIGH (ref 70–99)
Glucose-Capillary: 121 mg/dL — ABNORMAL HIGH (ref 70–99)
Glucose-Capillary: 131 mg/dL — ABNORMAL HIGH (ref 70–99)

## 2020-06-25 MED ORDER — OSMOLITE 1.5 CAL PO LIQD
840.0000 mL | ORAL | Status: DC
  Administered 2020-06-26 – 2020-07-15 (×19): 840 mL
  Filled 2020-06-25: qty 1000
  Filled 2020-06-25: qty 948
  Filled 2020-06-25: qty 1000
  Filled 2020-06-25: qty 948
  Filled 2020-06-25 (×3): qty 1000

## 2020-06-25 NOTE — Progress Notes (Signed)
Brief Nutrition Note  RD received consult to clarify whether pt is to be receiving tube feeding over 24-hour period or overnight.  Pt is to be receiving nocturnal tube feeding that runs for 12 hours. Pt has orders for Osmolite 1.5 @ 60ml/hr via PEG for 12 hours from 1800 (6:00 PM) until 0600 (6:00 AM). Tube feeding is to be started daily at 6:00 PM and tube feeding should be turned off daily at 6:00 AM. This nocturnal tube feeding regimen is intended to stimulate pt's appetite during the day while still meeting a majority of the pt's minimum estimated needs overnight.   Pt is also receiving 40ml Prosource TF TID via PEG and free water Q4H. These can be given throughout the day.  RD clarified tube feeding orders with RN. RD also verified orders are placed correctly within Epic.   TF regimen provides 1380 kcals (meets 76% minimum calorie needs), 86 grams protein (meets 100% of minimum protein needs), free water ( total with flushes).   Pt also has orders for Ensure Enlive po TID to help pt meet his needs po. Each supplement provides 350 kcal and 20 grams of protein.   RD will monitor adequacy of PO intake over next few days and adjust nocturnal tube feeding regimen as appropriate. RN to provide meal documentation.   Eugene Gavia, MS, RD, LDN RD pager number and weekend/on-call pager number located in [place].

## 2020-06-25 NOTE — Progress Notes (Signed)
Patient on the LLS team.  Seen by PT/OT who recommend CIR prior to family taking patient home.  TUBE FEEDS ARE NOCTURNAL ONLY.   6PM to 6AM.   Consulted CIR for evaluation Alan Everett

## 2020-06-25 NOTE — Progress Notes (Signed)
Physical Therapy Treatment Patient Details Name: Alan Everett MRN: [id number] DOB: 11-01-1955 Today's Date: 06/25/2020    History of Present Illness Patient is a 64 y/o male with PMH ETOH use, tobacco abuse, admitted with R side weakness,sensory loss and speech deficits.  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    PT Comments    Pt tolerates treatment well. Pt continues to demonstrate improved communication with more expressive aphasia than receptive noted during this session. Pt also demonstrates improved awareness of R weakness, often reporting RUE and RLE cannot move, however the pt also remains inattentive and neglectful of this time, reporting his RUE and RLE are not his when asked to identify. Pt continued to require significant physical assistance to perform all functional mobility tasks and will benefit from PT POC to aide in reducing falls risk and caregiver burden. PT updates recommendations to CIR at this time as the pt is able to consistently follow one step commands with multimodal cues and demonstrate the potential to make significant functional gains with the hope of eventually returning home with assistance from family.   Follow Up Recommendations  CIR     Equipment Recommendations  Wheelchair (measurements PT);Wheelchair cushion (measurements PT);Hospital bed;3in1 (PT);Other (comment) (mechanical lift)    Recommendations for Other Services       Precautions / Restrictions Precautions Precautions: Fall Precaution Comments: G tube, right neglect, prevalon boot Required Braces or Orthoses: Splint/Cast Splint/Cast: soft pillow splint R UE at night Restrictions Weight Bearing Restrictions: No    Mobility  Bed Mobility Overal bed mobility: Needs Assistance Bed Mobility: Supine to Sit     Supine to sit: Max assist;HOB elevated        Transfers Overall transfer level: Needs assistance Equipment  used: 1 person hand held assist Transfers: Sit to/from [employer] Sit to Stand: Max assist Stand pivot transfers: Max assist;+2 physical assistance       General transfer comment: SPT in STED[place] with maxA x2, maxA sit to stand x2 trials with PT providing R knee block and BUE support  Ambulation/Gait                 Stairs             Wheelchair Mobility    Modified Rankin (Stroke Patients Only) Modified Rankin (Stroke Patients Only) Pre-Morbid Rankin Score: No symptoms Modified Rankin: Severe disability     Balance Overall balance assessment: Needs assistance Sitting-balance support: Single extremity supported;Feet supported Sitting balance-Leahy Scale: Poor Sitting balance - Comments: modA due to posterior lean without UE support, minG-minA with UE support Postural control: Posterior lean Standing balance support: Bilateral upper extremity supported Standing balance-Leahy Scale: Zero Standing balance comment: maxA with UE support of PT                            Cognition Arousal/Alertness: Awake/alert Behavior During Therapy: WFL for tasks assessed/performed Overall Cognitive Status: Impaired/Different from baseline Area of Impairment: Safety/judgement;Awareness;Problem solving;Following commands                   Current Attention Level: Sustained Memory: Decreased short-term memory Following Commands: Follows one step commands with increased time;Follows multi-step commands inconsistently Safety/Judgement: Decreased awareness of safety;Decreased awareness of deficits Awareness: Emergent Problem Solving: Slow processing;Requires verbal cues;Requires tactile cues;Difficulty sequencing General Comments: pt reports R side of his body is not his arm or leg. pt was accurate  in reporting the L side of his body with same questioning       Exercises Other Exercises Other Exercises: PROM of R ankle, knee, hip  flexion/extension 5 reps, PT providing cues to for pt to attempt to initiate movement of RLE Other Exercises: hip adduction x5 with visual cue of squeezing glove between legs, only LLE activating    General Comments General comments (skin integrity, edema, etc.): VSS      Pertinent Vitals/Pain Pain Assessment: Faces Faces Pain Scale: Hurts even more Pain Location: R hip area, knee, hip adduction Pain Descriptors / Indicators: Discomfort;Grimacing;Guarding Pain Intervention(s): Monitored during session;Repositioned    Home Living                      Prior Function            PT Goals (current goals can now be found in the care plan section) Acute Rehab PT Goals Patient Stated Goal: to go home Progress towards PT goals: Progressing toward goals    Frequency    Min 3X/week      PT Plan Current plan remains appropriate    Co-evaluation PT/OT/SLP Co-Evaluation/Treatment: Yes Reason for Co-Treatment: For patient/therapist safety;To address functional/ADL transfers;Necessary to address cognition/behavior during functional activity PT goals addressed during session: Mobility/safety with mobility;Balance;Strengthening/ROM OT goals addressed during session: ADL's and self-care;Proper use of Adaptive equipment and DME;Strengthening/ROM      AM-PAC PT "6 Clicks" Mobility   Outcome Measure  Help needed turning from your back to your side while in a flat bed without using bedrails?: A Lot Help needed moving from lying on your back to sitting on the side of a flat bed without using bedrails?: Total Help needed moving to and from a bed to a chair (including a wheelchair)?: Total Help needed standing up from a chair using your arms (e.g., wheelchair or bedside chair)?: Total Help needed to walk in hospital room?: Total Help needed climbing 3-5 steps with a railing? : Total 6 Click Score: 7    End of Session Equipment Utilized During Treatment: Gait belt Activity  Tolerance: Patient tolerated treatment well Patient left: in chair;with call bell/phone within reach;Other (comment) (OT present at end of session) Nurse Communication: Mobility status;Need for lift equipment PT Visit Diagnosis: Unsteadiness on feet (R26.81);Muscle weakness (generalized) (M62.81);Difficulty in walking, not elsewhere classified (R26.2);Other symptoms and signs involving the nervous system (R29.898);Hemiplegia and hemiparesis Hemiplegia - Right/Left: Right Hemiplegia - dominant/non-dominant: Dominant Hemiplegia - caused by: Nontraumatic intracerebral hemorrhage     Time: 0911-0938 PT Time Calculation (min) (ACUTE ONLY): 27 min  Charges:  $Therapeutic Activity: 8-22 mins                     Arlyss Gandy, PT, DPT Acute Rehabilitation Pager: [phone]    Arlyss Gandy 06/25/2020, 10:28 AM

## 2020-06-25 NOTE — Evaluation (Signed)
Occupational Therapy Evaluation Patient Details Name: Alan Everett MRN: [id number] DOB: [birth year] Today's Date: 06/25/2020    History of Present Illness Patient is a 64 y/o male with PMH ETOH use, tobacco abuse, admitted with R side weakness,sensory loss and speech deficits.  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   Clinical Impression   Pt progressing to oob this session with therapy and tolerating increased therapy. Pt with strong R inattention and lack of awareness to R side of body. Pt undershooting with self feeding L hand. Pt positioned in chair with chair encouraging R side attention for visitors entering room. Music (motown) playing in room for enrichment and pt nodding head to music. Recommendation updated to CIR as CM notes that family like to progress home with patient. Pt could benefit from CIR to decrease burden of care for d/c home.      Follow Up Recommendations  CIR;Supervision/Assistance - 24 hour    Equipment Recommendations  Wheelchair (measurements OT);Wheelchair cushion (measurements OT);Hospital bed;Other (comment) (lift equipment)    Recommendations for Other Services       Precautions / Restrictions Precautions Precautions: Fall Precaution Comments: G tube, right neglect, prevalon boot Required Braces or Orthoses: Splint/Cast Splint/Cast: soft pillow splint R UE at night Restrictions Weight Bearing Restrictions: No      Mobility Bed Mobility Overal bed mobility: Needs Assistance Bed Mobility: Supine to Sit     Supine to sit: Max assist;HOB elevated     General bed mobility comments: Pt sitting eob with PT on arrival. Ot starting session from static standing mod (A)     Transfers Overall transfer level: Needs assistance Equipment used: 1 person hand held assist Transfers: Sit to/from [employer] Sit to Stand: Max assist Stand pivot transfers: Max assist;+2  physical assistance       General transfer comment: pt requires use of stedy this session total +2 MAx (A). pt sit<>Stand x4 during session with stedy used 2 attempts. pt requires R LE blocked and positioning R UE    Balance Overall balance assessment: Needs assistance Sitting-balance support: Single extremity supported;Feet supported Sitting balance-Leahy Scale: Poor Sitting balance - Comments: modA due to posterior lean without UE support, minG-minA with UE support Postural control: Posterior lean Standing balance support: Bilateral upper extremity supported Standing balance-Leahy Scale: Zero Standing balance comment: maxA with UE support of PT                           ADL either performed or assessed with clinical judgement   ADL Overall ADL's : Needs assistance/impaired Eating/Feeding: Maximal assistance Eating/Feeding Details (indicate cue type and reason): pt with a few bites and sips. pt noted to have signs of aspiration with drinking from straw. Grooming: Therapist, nutritional;Moderate assistance;Sitting Grooming Details (indicate cue type and reason): pt even holding ice to face due to pimple on L side of eye. very swollen and appears painful to touch             Lower Body Dressing: Maximal assistance Lower Body Dressing Details (indicate cue type and reason): pt lifting L LE with max cues for OT to apply. pt states " bring it to me " but was unabl to tolerate figure 4 crossing               General ADL Comments: pt with strong R inattention of body noted throughout session. pt only reaching for items on  far L side of tray . pt with pain in L side due to abduction hip knee flexion and trunk rotation prolonged positioning.      Vision         Perception     Praxis      Pertinent Vitals/Pain Pain Assessment: Faces Faces Pain Scale: Hurts even more Pain Location: R hip area, knee, hip adduction Pain Descriptors / Indicators:  Discomfort;Grimacing;Guarding Pain Intervention(s): Monitored during session;Repositioned     Hand Dominance     Extremity/Trunk Assessment Upper Extremity Assessment Upper Extremity Assessment: RUE deficits/detail RUE Deficits / Details: tone noted in R UE without any activation noted this session. pt holding initially in flexed position. positioned on pillow RUE Sensation: decreased light touch;decreased proprioception RUE Coordination: decreased fine motor;decreased gross motor           Communication     Cognition Arousal/Alertness: Awake/alert Behavior During Therapy: WFL for tasks assessed/performed Overall Cognitive Status: Impaired/Different from baseline Area of Impairment: Safety/judgement;Awareness;Problem solving;Following commands                   Current Attention Level: Sustained Memory: Decreased short-term memory Following Commands: Follows one step commands with increased time;Follows multi-step commands inconsistently Safety/Judgement: Decreased awareness of safety;Decreased awareness of deficits Awareness: Emergent Problem Solving: Slow processing;Requires verbal cues;Requires tactile cues;Difficulty sequencing General Comments: pt reports R side of his body is not his arm or leg. pt was accurate in reporting the L side of his body with same questioning    General Comments  VSS    Exercises Exercises: Other exercises General Exercises - Upper Extremity Elbow Flexion: PROM;Right;Supine;10 reps Elbow Extension: PROM;Right;Both;10 reps;Supine Wrist Flexion: PROM;Right;10 reps;Supine Wrist Extension: PROM;Right;10 reps;Supine Digit Composite Flexion: PROM;5 reps;Right;Supine Other Exercises Other Exercises: PROM of R ankle, knee, hip flexion/extension 5 reps, PT providing cues to for pt to attempt to initiate movement of RLE Other Exercises: hip adduction x5 with visual cue of squeezing glove between legs, only LLE activating   Shoulder  Instructions      Home Living                                          Prior Functioning/Environment                   OT Problem List:        OT Treatment/Interventions:      OT Goals(Current goals can be found in the care plan section) Acute Rehab OT Goals Patient Stated Goal: to go home OT Goal Formulation: Patient unable to participate in goal setting Time For Goal Achievement: 07/09/20 Potential to Achieve Goals: Good ADL Goals Pt Will Perform Grooming: sitting;with min guard assist Pt Will Transfer to Toilet: with max assist;with transfer board;bedside commode Pt/caregiver will Perform Home Exercise Program: Right Upper extremity;With written HEP provided Additional ADL Goal #1: pt will look at object in midline 50% of session Additional ADL Goal #2: Pt will be able to track right and left 5 times in succesion with increased time Additional ADL Goal #3: Pt will follow 2 step command  3 out of 5 one step commands Additional ADL Goal #4: pt will sit EOB min guard (A) as prep for transfers  OT Frequency: Min 2X/week   Barriers to D/C:            Co-evaluation PT/OT/SLP Co-Evaluation/Treatment: Yes Reason for Co-Treatment: For  patient/therapist safety;To address functional/ADL transfers;Necessary to address cognition/behavior during functional activity PT goals addressed during session: Mobility/safety with mobility;Balance;Strengthening/ROM OT goals addressed during session: ADL's and self-care;Proper use of Adaptive equipment and DME;Strengthening/ROM      AM-PAC OT "6 Clicks" Daily Activity     Outcome Measure Help from another person eating meals?: A Lot Help from another person taking care of personal grooming?: A Lot Help from another person toileting, which includes using toliet, bedpan, or urinal?: A Lot Help from another person bathing (including washing, rinsing, drying)?: A Lot Help from another person to put on and taking off  regular upper body clothing?: A Lot Help from another person to put on and taking off regular lower body clothing?: A Lot 6 Click Score: 12   End of Session Equipment Utilized During Treatment: Gait belt Nurse Communication: Mobility status;Precautions  Activity Tolerance: Patient tolerated treatment well Patient left: in chair;with call bell/phone within reach;with chair alarm set  OT Visit Diagnosis: Unsteadiness on feet (R26.81);Muscle weakness (generalized) (M62.81) Hemiplegia - Right/Left: Right Hemiplegia - dominant/non-dominant: Dominant                Time: 0915 ([id number] OT Time Calculation (min): 42 min Charges:  OT General Charges $OT Visit: 1 Visit OT Treatments $Self Care/Home Management : 23-37 mins   Brynn, OTR/L  Acute Rehabilitation Services Pager: [phone] Office: [phone] .   Mateo Flow 06/25/2020, 10:34 AM

## 2020-06-26 LAB — GLUCOSE, CAPILLARY
Glucose-Capillary: 110 mg/dL — ABNORMAL HIGH (ref 70–99)
Glucose-Capillary: 114 mg/dL — ABNORMAL HIGH (ref 70–99)
Glucose-Capillary: 118 mg/dL — ABNORMAL HIGH (ref 70–99)
Glucose-Capillary: 125 mg/dL — ABNORMAL HIGH (ref 70–99)
Glucose-Capillary: 138 mg/dL — ABNORMAL HIGH (ref 70–99)
Glucose-Capillary: 147 mg/dL — ABNORMAL HIGH (ref 70–99)
Glucose-Capillary: 153 mg/dL — ABNORMAL HIGH (ref 70–99)

## 2020-06-26 NOTE — Progress Notes (Addendum)
Physical Therapy Treatment Patient Details Name: Alan Everett MRN: [id number] DOB: 26-Oct-1955 Today's Date: 06/26/2020    History of Present Illness Patient is a 64 y/o male with PMH ETOH use, tobacco abuse, admitted with R side weakness,sensory loss and speech deficits.  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    PT Comments    Pt supine in bed on arrival soiled due to bowel incontinence.  R hip ER and very painful to bring to neutral position.  Performed out of bed to standing in sara stedy with heavy max assistance.  Pt with poor tolerance.  Pt returned back to bed and PTA focused on RUE/LE stretching and ROM.  Pt position into chair position with R knee slightly flexed and R hip propped to neutral position.  Based on minimal gains and CIR denial will update recommendations to SNF at this time.  Family ( niece ) is agreeable to SNF placement.      Follow Up Recommendations  SNF     Equipment Recommendations  Wheelchair (measurements PT);Wheelchair cushion (measurements PT);Hospital bed;3in1 (PT);Other (comment) (mechanical lift)    Recommendations for Other Services Rehab consult     Precautions / Restrictions Precautions Precautions: Fall Precaution Comments: G tube, right neglect, prevalon boot Splint/Cast: soft pillow splint R UE at night Restrictions Weight Bearing Restrictions: No    Mobility  Bed Mobility Overal bed mobility: Needs Assistance Bed Mobility: Supine to Sit   Sidelying to sit: Max assist;+2 for safety/equipment Supine to sit: Max assist     General bed mobility comments: Pt required assistance to move from supine to sit and back to supine.  Presents with posterior lean  Transfers Overall transfer level: Needs assistance Equipment used: Ambulation equipment used (sara stedy) Transfers: Sit to/from Stand Sit to Stand: Max assist         General transfer comment: Pt required  assistance to facilitate R side into standing.  Strong push noted to R side.  Required adduction of RLE and facilitation of trunk and R UE to come to standing.  Poor tolerance noted due to pain.  Ambulation/Gait                 Stairs             Wheelchair Mobility    Modified Rankin (Stroke Patients Only)       Balance Overall balance assessment: Needs assistance   Sitting balance-Leahy Scale: Poor Sitting balance - Comments: modA due to posterior lean without UE support, minG-minA with UE support     Standing balance-Leahy Scale: Zero Standing balance comment: maxA with UE support of PT                            Cognition Arousal/Alertness: Awake/alert Behavior During Therapy: WFL for tasks assessed/performed Overall Cognitive Status: Impaired/Different from baseline Area of Impairment: Safety/judgement;Awareness;Problem solving;Following commands                 Orientation Level: Disoriented to;Place;Time;Situation Current Attention Level: Sustained Memory: Decreased short-term memory Following Commands: Follows one step commands with increased time;Follows multi-step commands inconsistently Safety/Judgement: Decreased awareness of safety;Decreased awareness of deficits Awareness: Emergent Problem Solving: Slow processing;Requires verbal cues;Requires tactile cues;Difficulty sequencing        Exercises Other Exercises Other Exercises: PROM of R ankle, knee, hip flexion/extension 5 reps, PT providing cues to for pt to attempt to initiate movement of RLE  Other Exercises: R shoulder, elbow, wrist and digit flexion extension x 10 reps. Other Exercises: Worked on stretching of right hip rotators as pt favors right hip ER, difficult to get to neutral positioning, also performed streching of R HS.    General Comments        Pertinent Vitals/Pain Pain Assessment: Faces Faces Pain Scale: Hurts whole lot Pain Location: R hip area, knee,  hip adduction Pain Descriptors / Indicators: Discomfort;Grimacing;Guarding Pain Intervention(s): Monitored during session;Repositioned    Home Living                      Prior Function            PT Goals (current goals can now be found in the care plan section) Acute Rehab PT Goals Potential to Achieve Goals: Fair Progress towards PT goals: Progressing toward goals    Frequency    Min 3X/week      PT Plan Discharge plan needs to be updated    Co-evaluation              AM-PAC PT "6 Clicks" Mobility   Outcome Measure  Help needed turning from your back to your side while in a flat bed without using bedrails?: Total Help needed moving from lying on your back to sitting on the side of a flat bed without using bedrails?: Total Help needed moving to and from a bed to a chair (including a wheelchair)?: Total Help needed standing up from a chair using your arms (e.g., wheelchair or bedside chair)?: Total Help needed to walk in hospital room?: Total Help needed climbing 3-5 steps with a railing? : Total 6 Click Score: 6    End of Session Equipment Utilized During Treatment: Gait belt Activity Tolerance: Patient tolerated treatment well Patient left: in bed;with bed alarm set;with call bell/phone within reach;with family/visitor present Nurse Communication: Mobility status;Need for lift equipment PT Visit Diagnosis: Unsteadiness on feet (R26.81);Muscle weakness (generalized) (M62.81);Difficulty in walking, not elsewhere classified (R26.2);Other symptoms and signs involving the nervous system (R29.898);Hemiplegia and hemiparesis Hemiplegia - Right/Left: Right Hemiplegia - dominant/non-dominant: Dominant Hemiplegia - caused by: Nontraumatic intracerebral hemorrhage     Time: 1149-1222 PT Time Calculation (min) (ACUTE ONLY): 33 min  Charges:  $Therapeutic Exercise: 8-22 mins $Therapeutic Activity: 8-22 mins                     Alan Everett , PTA Acute  Rehabilitation Services Pager [phone] Office [phone]     Alan Everett 06/26/2020, 12:40 PM

## 2020-06-26 NOTE — Progress Notes (Addendum)
Inpatient Rehab Admissions Coordinator:   Inpatient rehab consult received. I have discussed Pt. With Rehab MD, who feels that based on progress over the past month, Pt. Is unlikely to progress to the level that a single person could care for him at d/c from CIR  and Pt. Will not have more than one person at home with him for assistance.  CIR will not pursue admission for this Pt. Pt.'s niece, [name], states that they would like to pursue SNF, [facility] if possible.   Megan Salon, MS, CCC-SLP Rehab Admissions Coordinator  [phone] (celll) [phone] (office)

## 2020-06-26 NOTE — Progress Notes (Signed)
Patient not a candidate for CIR.  Continue to await SNF placement. [name] DO

## 2020-06-27 LAB — GLUCOSE, CAPILLARY
Glucose-Capillary: 108 mg/dL — ABNORMAL HIGH (ref 70–99)
Glucose-Capillary: 113 mg/dL — ABNORMAL HIGH (ref 70–99)
Glucose-Capillary: 120 mg/dL — ABNORMAL HIGH (ref 70–99)
Glucose-Capillary: 136 mg/dL — ABNORMAL HIGH (ref 70–99)
Glucose-Capillary: 149 mg/dL — ABNORMAL HIGH (ref 70–99)
Glucose-Capillary: 179 mg/dL — ABNORMAL HIGH (ref 70–99)

## 2020-06-27 NOTE — Progress Notes (Signed)
Patient on the LLS team.  No overnight events.  Not a candidate for CIR: "based on progress over the past month, Pt. Is unlikely to progress to the level that a single person could care for him at d/c from CIR  and Pt. Will not have more than one person at home with him for assistance"  [name] DO

## 2020-06-28 LAB — GLUCOSE, CAPILLARY
Glucose-Capillary: 155 mg/dL — ABNORMAL HIGH (ref 70–99)
Glucose-Capillary: 97 mg/dL (ref 70–99)
Glucose-Capillary: 143 mg/dL — ABNORMAL HIGH (ref 70–99)
Glucose-Capillary: 105 mg/dL — ABNORMAL HIGH (ref 70–99)
Glucose-Capillary: 99 mg/dL (ref 70–99)
Glucose-Capillary: 142 mg/dL — ABNORMAL HIGH (ref 70–99)

## 2020-06-28 NOTE — Progress Notes (Signed)
[facility] PROGRESS NOTE  Alan SierrasJiles Friese ZOX:[id number] DOB: [birth year] DOA: 05/05/2020 PCP: Patient, No Pcp Per    11/16   Status: Remains inpatient appropriate because:Altered mental status and Unsafe d/c plan   Dispo: The patient is from: Home              Anticipated d/c is to: SNF vs home with family and home health services              Anticipated d/c date is: > 3 days              Patient currently is medically stable to d/c.  No expectation of full recovery from neurological injury with recommendation for long-term care at skilled nursing facility.  Needs insurance approval, disability and likely Medicaid to ensure placement at SNF-still waiting on needs to complete Medicaid paperwork -11/19 TOC to follow-up with niece   Code Status: Full Family Communication: Patient only-no family at bedside DVT prophylaxis: SCDs only-presented with intraventricular hemorrhage resulting in neurological injury Vaccination status: We will need to discuss with family if patient has previously received Covid vaccination  Foley catheter: No  HPI: 64 year old male patient with history of iron deficiency anemia, alcohol use, cocaine use and tobacco abuse.  Patient presented to the ER by EMS on 10/6 as a code stroke.  At that time he was noted to have acute onset right-sided weakness, sensory loss and aphasia.  CT of the head revealed an enlarging left thalamic capsular intraparenchymal hemorrhage with increasing intraventricular hemorrhage from left ventricle into the third and fourth ventricles.  Neurosurgery consulted but no indication at that time for EVD.  Urine drug screen was found to be positive for cocaine.  Since admission hospital course has been complicated by pneumonia and recurrent blood loss anemia.  It has been noted that in the outpatient setting he had undergone [facility] and [facility] [place].  No significant abnormalities identified and patient was  recommended to continue previous iron.  He continues to have issues regarding communication as evidenced by global aphasia.  He also has moderate to severe dysphagia and is requiring feedings via a PEG tube.  Subjective: Awake and working with PT at bedside.  Objective: Vitals:   06/28/20 0743 06/28/20 1133  BP: 133/69 103/67  Pulse: 97 98  Resp: 17 18  Temp: 98 F (36.7 C) (!) 97.5 F (36.4 C)  SpO2: 100% 100%    Intake/Output Summary (Last 24 hours) at 06/28/2020 1210 Last data filed at 06/28/2020 0800 Gross per 24 hour  Intake 367 ml  Output 1700 ml  Net -1333 ml   Filed Weights   06/15/20 0407 06/17/20 0500 06/18/20 0453  Weight: 59.7 kg 59.3 kg 60 kg    Exam:  Constitutional: NAD, calm, comfortable Respiratory: Posterior lung sounds clear to auscultation bilaterally, No increased work of breathing. RA Cardiovascular: Regular rate and rhythm, adequate capillary refill no peripheral edema. Abdomen: no tenderness, Bowel sounds positive.  PEG tube tube feeding Musculoskeletal: RUE neglect/intermittent issues with persistent flexion of RUE Skin: Buttock decubitus examined today and appears to be granulating in well Neurologic: CN 2-12 appears to be grossly intact except for notable global aphasia. Sensation intact, Strength 4/5 left side-right side with neglect and paralysis.   Psychiatric: Alert, responds to name.  Verbal communication limited due to global aphasia.     Assessment/Plan: Acute problems: Acute Hemorrhagic stroke/cerebral edema -Etiology 2/2 severe hypertension, alcohol and cocaine use. -Because of IVH, neurosurgery was  consulted. EVD was deferred.  -CT head: stable findings.  -Persistent global aphasia, unable to consistently follow commands.  -Not on antiplatelet tx secondary to hemorrhagic etiology. LDL 80. -Continue PT and OT.  Noted with improved static and dynamic sitting balance. -May be a candidate for CIR if family can arrange for 24/7  care after discharge  Moderate to severe dysphagia 2/2 stroke/severe protein calorie malnutrition -Secondary to stroke. PEG tube placed on 05/21/2020. Nutrition Problem: Severe Malnutrition Etiology: social / environmental circumstances Signs/Symptoms: severe fat depletion, severe muscle depletion Interventions: Tube feeding currently tolerating mechanical soft diet with thin liquids intake.  Also on nocturnal tube feedings. Estimated body mass index is 21.35 kg/m as calculated from the following:   Height as of 04/09/20: 5\' 6"  (1.676 m).   Weight as of this encounter: 60 kg.   Acute on chronic anemia -Total of 6 units of PRBC transfusions given in this admission current hemoglobin stable between 8 and 9. -GI consulted. -s/p panendoscopy 03/01/2020 as an OP:  Colonoscopy-sigmoid diverticulosis EGD-normal esophagus, gastritis, normal duodenum and no other abnormal findings -Continue PPI twice daily. (See below regarding history of gastritis on recent EGD)  Stage II buttock decubitus Wound / Incision (Open or Dehisced) 05/21/20 Puncture Abdomen Left;Anterior;Upper G-tube insertion site  (Active)  Date First Assessed/Time First Assessed: 05/21/20 1533   Wound Type: Puncture  Location: Abdomen  Location Orientation: Left;Anterior;Upper  Wound Description (Comments): G-tube insertion site   Present on Admission: No    Assessments 05/21/2020  3:34 PM 06/27/2020  8:10 AM  Dressing Type Gauze (Comment);Tape dressing Gauze (Comment)  Dressing Changed New New  Dressing Status Clean;Dry;Intact Dry;Intact  Dressing Change Frequency PRN PRN  Site / Wound Assessment Clean;Dry Dry;Clean  Peri-wound Assessment Intact Intact  Closure None --  Drainage Amount None --  Treatment Cleansed --     No Linked orders to display     Pressure Injury 06/02/20 Buttocks Right Stage 2 -  Partial thickness loss of dermis presenting as a shallow open injury with a red, pink wound bed without slough. 7.5 cm in  lenth by 4.5 width  (Active)  Date First Assessed/Time First Assessed: 06/02/20 0800   Location: Buttocks  Location Orientation: Right  Staging: Stage 2 -  Partial thickness loss of dermis presenting as a shallow open injury with a red, pink wound bed without slough.  Wound Descri...    Assessments 06/02/2020  8:00 AM 06/28/2020  8:00 AM  Dressing Type -- Foam - Lift dressing to assess site every shift  Dressing -- Intact  Dressing Change Frequency -- Every 3 days  Site / Wound Assessment -- Dry  Wound Length (cm) 7.5 cm --  Wound Width (cm) 4.5 cm --  Wound Surface Area (cm^2) 33.75 cm^2 --  Drainage Amount None --  Treatment Other (Comment) --     No Linked orders to display      Other problems: Goals of care -Poor neurological recovery so far.  Do not expect he will return to baseline level of functioning and likely will require long-term skilled nursing care. -Palliative care was consulted in the past. Patient remains full code.   Bibasilar pneumonia -resolved -CT scan 10/12 showed bibasilar pneumonia.  -Treated with broad-spectrum antibiotics.  -WBC normalized. No longer having fever.  Klebsiella UTI  -resolved -Urine culture report from 10/10 with more than 1000 CFU per mL of Klebsiella pneumoniae.  -Completed 5-day course of IV Rocephin.   Sinus tachycardia Essential hypertension/grade 1 diastolic dysfunction grade 1  diastolic dysfunction -Heart rate and blood pressure both are stable on Coreg 3.125 mg daily. -Echocardiogram this admission revealed preserved LVEF with physiologic findings consistent with diastolic dysfunction  Hypernatremia  - resolved   Elevated liver enzymes -Right upper quadrant ultrasound-cholelithiasis without acute cholecystitis -Chronic mild elevation of liver enzymes persist.  Tobacco Abuse -Cessation counseling this admission deferred in the context of abnormal neurological status and patient unable to participate  Cocaine  abuse -Cessation counseling this admission deferred in the context of abnormal neurological status and patient unable to participate  GERD/recent gastritis on endoscopy August 2021 -PPI twice daily  Data Reviewed: Basic Metabolic Panel: No results for input(s): NA, K, CL, CO2, GLUCOSE, BUN, CREATININE, CALCIUM, MG, PHOS in the last 168 hours. Liver Function Tests: No results for input(s): AST, ALT, ALKPHOS, BILITOT, PROT, ALBUMIN in the last 168 hours. No results for input(s): LIPASE, AMYLASE in the last 168 hours. No results for input(s): AMMONIA in the last 168 hours. CBC: No results for input(s): WBC, NEUTROABS, HGB, HCT, MCV, PLT in the last 168 hours. Cardiac Enzymes: No results for input(s): CKTOTAL, CKMB, CKMBINDEX, TROPONINI in the last 168 hours. BNP (last 3 results) Recent Labs    05/29/20 0409  BNP 23.2    ProBNP (last 3 results) No results for input(s): PROBNP in the last 8760 hours.  CBG: Recent Labs  Lab 06/27/20 2010 06/27/20 2336 06/28/20 0329 06/28/20 0746 06/28/20 1136  GLUCAP 136* 149* 155* 97 143*    No results found for this or any previous visit (from the past 240 hour(s)).   Studies: No results found.  Scheduled Meds: . carvedilol  6.25 mg Per Tube BID WC  . chlorhexidine  15 mL Mouth Rinse BID  . feeding supplement  237 mL Oral TID BM  . feeding supplement (OSMOLITE 1.5 CAL)  840 mL Per Tube Q24H  . feeding supplement (PROSource TF)  45 mL Per Tube TID  . free water  200 mL Per Tube Q4H  . insulin aspart  0-9 Units Subcutaneous Q4H  . insulin glargine  5 Units Subcutaneous Daily  . mouth rinse  15 mL Mouth Rinse q12n4p  . pantoprazole sodium  40 mg Per Tube BID  . sennosides  5 mL Per Tube BID  . sodium chloride flush  10-40 mL Intracatheter Q12H   Continuous Infusions:  Principal Problem:   ICH (intracerebral hemorrhage) (HCC) Active Problems:   Polysubstance abuse (HCC)   Dysphagia due to recent cerebrovascular accident (CVA)    Hyperglycemia   Hypokalemia   Hypomagnesemia   Fever   SOB (shortness of breath)   Leukocytosis   Aspiration pneumonia of both lower lobes (HCC)   Palliative care by specialist   Goals of care, counseling/discussion   DNR (do not resuscitate) discussion   Protein-calorie malnutrition, severe   Heme positive stool   Acute lower UTI   Acute blood loss anemia   Pressure injury of skin   Consultants:  Neurology  Gastroenterology  Procedures:  Echocardiogram  Core track trach tube  PEG tube  Antibiotics: Anti-infectives (From admission, onward)   Start     Dose/Rate Route Frequency Ordered Stop   05/28/20 0900  cefTRIAXone (ROCEPHIN) 1 g in sodium chloride 0.9 % 100 mL IVPB        1 g 200 mL/hr over 30 Minutes Intravenous Every 24 hours 05/28/20 0807 06/01/20 0855   05/21/20 1515  ceFAZolin (ANCEF) IVPB 2g/100 mL premix        2 g  200 mL/hr over 30 Minutes Intravenous To Radiology 05/21/20 1429 05/21/20 2100   05/14/20 1000  cefTRIAXone (ROCEPHIN) 2 g in sodium chloride 0.9 % 100 mL IVPB        2 g 200 mL/hr over 30 Minutes Intravenous Daily 05/13/20 1454 05/15/20 1255   05/13/20 2200  metroNIDAZOLE (FLAGYL) tablet 500 mg        500 mg Per Tube Every 8 hours 05/13/20 1847 05/15/20 2219   05/13/20 1400  metroNIDAZOLE (FLAGYL) tablet 500 mg  Status:  Discontinued        500 mg Oral Every 8 hours 05/13/20 0935 05/13/20 1847   05/12/20 0900  cefTRIAXone (ROCEPHIN) 1 g in sodium chloride 0.9 % 100 mL IVPB  Status:  Discontinued        1 g 200 mL/hr over 30 Minutes Intravenous Daily 05/12/20 0817 05/13/20 1454   05/12/20 0900  azithromycin (ZITHROMAX) 500 mg in sodium chloride 0.9 % 250 mL IVPB  Status:  Discontinued        500 mg 250 mL/hr over 60 Minutes Intravenous Daily 05/12/20 0817 05/13/20 0935   05/09/20 0930  Ampicillin-Sulbactam (UNASYN) 3 g in sodium chloride 0.9 % 100 mL IVPB  Status:  Discontinued        3 g 200 mL/hr over 30 Minutes Intravenous Every 8 hours  05/09/20 0837 05/09/20 0839   05/09/20 0930  Ampicillin-Sulbactam (UNASYN) 3 g in sodium chloride 0.9 % 100 mL IVPB  Status:  Discontinued        3 g 200 mL/hr over 30 Minutes Intravenous Every 6 hours 05/09/20 0839 05/12/20 0817       Time spent: 20 minutes    Junious Silk ANP  [facility] Pager [phone]. If 7PM-7AM, please contact night-coverage at [email or web address] 06/28/2020, 12:10 PM  LOS: 54 days

## 2020-06-28 NOTE — Progress Notes (Signed)
Physical Therapy Treatment Patient Details Name: Alan Everett MRN: [id number] DOB: 03/24/1956 Today's Date: 06/28/2020    History of Present Illness Patient is a 64 y/o male with PMH ETOH use, tobacco abuse, admitted with R side weakness,sensory loss and speech deficits.  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    PT Comments    Pt is making slow progress towards his goals. He continues to display significant R side neglect, resulting in him leaning to his L side supine in bed and when sitting statically EOB. He also demonstrates significant tightness in his R hip flexors and knee extensors, placing him at risk for contractures. He required modAx1 to roll either direction and maxAx2 to sit up EOB with HOB elevated. He did not display any active movement in the R sided limbs with bed mob, but demonstrated activation with transfers. He initially displayed a L side lean in sitting EOB but once standing and then sitting in stedy he demonstrated a strong push to his R. His midline orinetation and balance significantly improved when a mirror was placed anterior to him though, thus he benefits from visual cues. He required modAx2 to come to stand and maintain standing balance in stedy for short periods of time, displaying excessive hip and knee flexion most likely due to stiffness. Will continue to follow acutely. Current recommendations remain appropriate.    Follow Up Recommendations  SNF     Equipment Recommendations  Wheelchair (measurements PT);Wheelchair cushion (measurements PT);Hospital bed;3in1 (PT);Other (comment) (mechanical lift)    Recommendations for Other Services       Precautions / Restrictions Precautions Precautions: Fall Precaution Comments: G tube, right neglect, prevalon boot Required Braces or Orthoses: Splint/Cast Splint/Cast: soft pillow splint R UE at night Restrictions Weight Bearing Restrictions: No     Mobility  Bed Mobility Overal bed mobility: Needs Assistance Bed Mobility: Rolling;Supine to Sit Rolling: Mod assist   Supine to sit: Max assist;+2 for physical assistance;HOB elevated     General bed mobility comments: Extra time and extensive cues provided for pt to look at R sided limbs and process cues for placement. Cues pt to grab bed rails and push through legs to roll, modA to complete and maintain sidelying for pericare. HOB elevated and cues provided to manage legs to L EOB and place L hand on L bed rail, no activation noted in R, maxAx2 to complete as pt exhibited pain in R leg with hip and knee passive extension to transition.  Transfers Overall transfer level: Needs assistance Equipment used: Ambulation equipment used Transfers: Sit to/from [employer] Sit to Stand: Mod assist;+2 physical assistance;+2 safety/equipment   Squat pivot transfers: Total assist;+2 safety/equipment     General transfer comment: Cues and assistance provided for feet placement on stedy platform and hand placement on bar. Mirror anterior to pt with pt being cued to find midline and maintain throughout as he tends to lean to R. Tactile cues and verbal cues provided at R quad and glut to extend hips and knees to come to stand, modAx2 and extra time to power up. Total assist with use of setdy to transfer to recliner from bed.  Ambulation/Gait             General Gait Details: Did not attempt this date   Stairs             Wheelchair Mobility    Modified Rankin (Stroke Patients Only) Modified Rankin (Stroke Patients Only)  Pre-Morbid Rankin Score: No symptoms Modified Rankin: Severe disability     Balance Overall balance assessment: Needs assistance Sitting-balance support: Single extremity supported;Feet supported Sitting balance-Leahy Scale: Poor Sitting balance - Comments: Pt leans to L initially with L hand on bed, mirror anterior to pt with pt able to  correct and find/maintain midline balance static sitting EOB with min guard for safety. Pt pushes to R when sitting in stedy and requires modA-min guard for safety.   Standing balance support: Bilateral upper extremity supported Standing balance-Leahy Scale: Poor Standing balance comment: Bilat UE support on stedy bar and modAx2 with cues to extend hips and knees to maintain balance. Pt able to maintain standing position x3 bouts for ~10-30 sec each. Mirror anterior to pt with cues to correct and find midline as he tends to push towards R in standing.                             Cognition Arousal/Alertness: Awake/alert Behavior During Therapy: WFL for tasks assessed/performed Overall Cognitive Status: Impaired/Different from baseline Area of Impairment: Safety/judgement;Awareness;Problem solving;Following commands;Memory;Attention                   Current Attention Level: Sustained Memory: Decreased short-term memory Following Commands: Follows one step commands inconsistently;Follows one step commands with increased time Safety/Judgement: Decreased awareness of safety;Decreased awareness of deficits Awareness: Emergent Problem Solving: Slow processing;Requires verbal cues;Requires tactile cues;Difficulty sequencing;Decreased initiation General Comments: Pt lying in bed to far L side upon arrival with him being fearful of transitioning or rolling to R and thus required extensive repeated cues to look to R and acknowledge he has space and is safe. Pt neglectful of R side despiute max cues to look at limbs and manage them. Pt required visual cues from mirror to correct midline balance.      Exercises      General Comments        Pertinent Vitals/Pain Pain Assessment: Faces Faces Pain Scale: Hurts whole lot Pain Location: R hip and knee with extension Pain Descriptors / Indicators: Discomfort;Grimacing;Guarding;Moaning Pain Intervention(s): Limited activity within  patient's tolerance;Monitored during session;Repositioned    Home Living                      Prior Function            PT Goals (current goals can now be found in the care plan section) Acute Rehab PT Goals Patient Stated Goal: to get better PT Goal Formulation: Patient unable to participate in goal setting Time For Goal Achievement: 07/01/20 Potential to Achieve Goals: Fair Progress towards PT goals: Progressing toward goals    Frequency    Min 3X/week      PT Plan Current plan remains appropriate    Co-evaluation              AM-PAC PT "6 Clicks" Mobility   Outcome Measure  Help needed turning from your back to your side while in a flat bed without using bedrails?: A Lot Help needed moving from lying on your back to sitting on the side of a flat bed without using bedrails?: A Lot Help needed moving to and from a bed to a chair (including a wheelchair)?: Total Help needed standing up from a chair using your arms (e.g., wheelchair or bedside chair)?: A Lot Help needed to walk in hospital room?: Total Help needed climbing 3-5 steps with a railing? : Total  6 Click Score: 9    End of Session Equipment Utilized During Treatment: Gait belt Activity Tolerance: Patient tolerated treatment well Patient left: in chair;with call bell/phone within reach;with chair alarm set;with nursing/sitter in room Nurse Communication: Need for lift equipment;Mobility status;Other (comment) (mirror use helped with balance) PT Visit Diagnosis: Unsteadiness on feet (R26.81);Muscle weakness (generalized) (M62.81);Difficulty in walking, not elsewhere classified (R26.2);Other symptoms and signs involving the nervous system (R29.898);Hemiplegia and hemiparesis Hemiplegia - Right/Left: Right Hemiplegia - dominant/non-dominant: Dominant 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

## 2020-06-29 LAB — GLUCOSE, CAPILLARY
Glucose-Capillary: 102 mg/dL — ABNORMAL HIGH (ref 70–99)
Glucose-Capillary: 129 mg/dL — ABNORMAL HIGH (ref 70–99)
Glucose-Capillary: 142 mg/dL — ABNORMAL HIGH (ref 70–99)
Glucose-Capillary: 142 mg/dL — ABNORMAL HIGH (ref 70–99)
Glucose-Capillary: 151 mg/dL — ABNORMAL HIGH (ref 70–99)
Glucose-Capillary: 159 mg/dL — ABNORMAL HIGH (ref 70–99)

## 2020-06-29 MED ORDER — JUVEN PO PACK
1.0000 | PACK | Freq: Two times a day (BID) | ORAL | Status: DC
  Administered 2020-06-29 – 2020-07-16 (×33): 1
  Filled 2020-06-29 (×32): qty 1

## 2020-06-29 NOTE — Progress Notes (Signed)
Physical Therapy Treatment Patient Details Name: Alan Everett MRN: [id number] DOB: 03/20/1956 Today's Date: 06/29/2020    History of Present Illness Patient is a 64 y/o male with PMH ETOH use, tobacco abuse, admitted with R side weakness,sensory loss and speech deficits.  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    PT Comments    Pt demonstrates slow progress towards his goals. Pt treated in conjunction with OT to maximize safety and quality of session this date. Focused session on coordinating movements to sequence functional mobility and on encouraging midline alignment and balance in upright postures. Attempted to provide visual feedback to pt through use of a mirror, but pt did not attend to the mirror as well as he did during his previous session and thus required extensive cues to correct his posture and maintain it. He continues to lean laterally to the R and require modAx1-2 for all bed mob and maxAx2 to come to stand and maintain his standing balance. Utilized stedy to transfer pt to recliner to encourage more upright postures throughout day. Will continue to follow acutely. Current recommendations remain appropriate.   Follow Up Recommendations  SNF     Equipment Recommendations  Wheelchair (measurements PT);Wheelchair cushion (measurements PT);Hospital bed;3in1 (PT);Other (comment) (mechanical lift)    Recommendations for Other Services Rehab consult     Precautions / Restrictions Precautions Precautions: Fall Precaution Comments: G tube, right neglect, prevalon boot Required Braces or Orthoses: Splint/Cast Splint/Cast: soft pillow splint R UE at night    Mobility  Bed Mobility Overal bed mobility: Needs Assistance Bed Mobility: Rolling;Sidelying to Sit Rolling: Mod assist Sidelying to sit: +2 for safety/equipment;Mod assist       General bed mobility comments: pt required MOD A to roll R<>L for  posterior pericare needing step by steps cues to sequecne steps. pt required assist to maneuver BLEs off of bed and MOD A to elevate trunk into sitting with cues to push up on arms on bed to ascend trunk.  Transfers Overall transfer level: Needs assistance Equipment used: Ambulation equipment used Transfers: Sit to/from [employer] Sit to Stand: +2 physical assistance;+2 safety/equipment;Max assist Stand pivot transfers: Total assist;+2 safety/equipment;+2 physical assistance       General transfer comment: Cues provided at R knee and pelvis to extend hips and knees. pt requried MAX A +2 to power into standing from EOB and assist to position RUE on stedy. pt required MAX A to maintain static standing in stedy with a tendency to lean to pts R side needing up to MAX A to self correct standing posture. Attempted to have pt correct posture himself with visual cues from mirror placed anterior to pt, but pt displayed decreased ability to pay attention and actively look at self in mirror.   Ambulation/Gait             General Gait Details: Did not attempt this date   Stairs             Wheelchair Mobility    Modified Rankin (Stroke Patients Only) Modified Rankin (Stroke Patients Only) Pre-Morbid Rankin Score: No symptoms Modified Rankin: Severe disability     Balance Overall balance assessment: Needs assistance Sitting-balance support: Single extremity supported;Feet supported Sitting balance-Leahy Scale: Poor Sitting balance - Comments: pt required LUE on bed rail to maintain static sitting with close min guard, otherwise up to modA to maintain balance when L UE was not on rail due to R lateral lean.  Postural control: Right lateral lean;Posterior lean Standing balance support: Bilateral upper extremity supported Standing balance-Leahy Scale: Poor Standing balance comment: required BUE support and external assist of maxAx1-2 to maintain static standing  balance. Visual cues provided through mirror anterior to pt, but pt displayed poor ability to look at self and correct posture.                            Cognition Arousal/Alertness: Awake/alert Behavior During Therapy: WFL for tasks assessed/performed Overall Cognitive Status: Impaired/Different from baseline Area of Impairment: Safety/judgement;Awareness;Problem solving;Following commands;Memory;Attention                   Current Attention Level: Sustained Memory: Decreased short-term memory Following Commands: Follows one step commands inconsistently;Follows one step commands with increased time Safety/Judgement: Decreased awareness of safety;Decreased awareness of deficits Awareness: Emergent Problem Solving: Slow processing;Requires verbal cues;Requires tactile cues;Difficulty sequencing;Decreased initiation General Comments: upon arrival pt lying sideways in bed with little awareness to safety implications related to bed positioning. pt following commands with increased time but seemed to be more affected by aphasia this session noted to speak unintelligibly at times. Pt unaware of necessity to use restroom until he was actively urinating or deficating. Increased time and difficulty with processing and responding to cues this date.      Exercises General Exercises - Upper Extremity Shoulder Flexion: Self ROM;Both;5 reps;Supine Elbow Flexion: PROM;5 reps;Right;Supine Elbow Extension: PROM;Right;Supine;5 reps Other Exercises Other Exercises: Passive low load, long duration stretch to bilat knees into extension x3 bouts of ~30-60 duration each (one bout with combined hip extension and 2 bouts with combined ankle DF stretch)    General Comments        Pertinent Vitals/Pain Pain Assessment: Faces Faces Pain Scale: Hurts little more Pain Location: R hip and knee with extension Pain Descriptors / Indicators: Discomfort;Grimacing;Guarding;Moaning Pain  Intervention(s): Limited activity within patient's tolerance;Monitored during session;Repositioned    Home Living                      Prior Function            PT Goals (current goals can now be found in the care plan section) Acute Rehab PT Goals Patient Stated Goal: to get better PT Goal Formulation: Patient unable to participate in goal setting Time For Goal Achievement: 07/01/20 Potential to Achieve Goals: Fair Progress towards PT goals: Progressing toward goals    Frequency    Min 3X/week      PT Plan Current plan remains appropriate    Co-evaluation PT/OT/SLP Co-Evaluation/Treatment: Yes Reason for Co-Treatment: Necessary to address cognition/behavior during functional activity;For patient/therapist safety;To address functional/ADL transfers PT goals addressed during session: Mobility/safety with mobility;Balance;Proper use of DME        AM-PAC PT "6 Clicks" Mobility   Outcome Measure  Help needed turning from your back to your side while in a flat bed without using bedrails?: A Lot Help needed moving from lying on your back to sitting on the side of a flat bed without using bedrails?: A Lot Help needed moving to and from a bed to a chair (including a wheelchair)?: Total Help needed standing up from a chair using your arms (e.g., wheelchair or bedside chair)?: A Lot Help needed to walk in hospital room?: Total Help needed climbing 3-5 steps with a railing? : Total 6 Click Score: 9    End of Session Equipment Utilized During Treatment: Gait belt  Activity Tolerance: Patient tolerated treatment well Patient left: in chair;with call bell/phone within reach;with chair alarm set;with nursing/sitter in room Nurse Communication: Mobility status PT Visit Diagnosis: Unsteadiness on feet (R26.81);Muscle weakness (generalized) (M62.81);Difficulty in walking, not elsewhere classified (R26.2);Other symptoms and signs involving the nervous system  (R29.898);Hemiplegia and hemiparesis Hemiplegia - Right/Left: Right Hemiplegia - dominant/non-dominant: Dominant Hemiplegia - caused by: Nontraumatic intracerebral hemorrhage     Time: 4680-3212 PT Time Calculation (min) (ACUTE ONLY): 32 min  Charges:  $Neuromuscular Re-education: 8-22 mins                     Raymond Gurney, PT, DPT Acute Rehabilitation Services  Pager: [phone] Office: [phone]    Jewel Baize 06/29/2020, 1:15 PM

## 2020-06-29 NOTE — Progress Notes (Signed)
Nutrition Follow-up  DOCUMENTATION CODES:   Severe malnutrition in context of social or environmental circumstances  INTERVENTION:   Please obtain updated weight  Continue nocturnal tube feeding via PEG: -Osmolite 1.5 @ 89ml/hr for 12 hours from 1800-0600 -64ml Prosource TF TID - free water Q4H  Nocturnal TF regimen will provide 1380 kcals (meets 76% estimated minimum calorie needs), 86 grams of protein (meets 100% minimum protein needs), free water ( )   Will also add 1 packet Juven BID VIA PEG, each packet provides 95 calories, 2.5 grams of protein (collagen), and 9.8 grams of carbohydrate (3 grams sugar); also contains 7 grams of L-arginine and L-glutamine, 300 mg vitamin C, 15 mg vitamin E, 1.2 mcg vitamin B-12, 9.5 mg zinc, 200 mg calcium, and 1.5 g  Calcium Beta-hydroxy-Beta-methylbutyrate to support wound healing.  Also provide pt with Ensure Enlive po TID, each supplement provides 350 kcal and 20 grams of protein.   Staff to assist pt with meals/supplements/snacks   NUTRITION DIAGNOSIS:   Severe Malnutrition related to social / environmental circumstances as evidenced by severe fat depletion, severe muscle depletion.  ongoing  GOAL:   Patient will meet greater than or equal to 90% of their needs  progressing  MONITOR:   TF tolerance, Diet advancement, Weight trends, Skin  REASON FOR ASSESSMENT:   Consult, New TF Enteral/tube feeding initiation and management  ASSESSMENT:   64 yo male presents with acute onset of right sided weakness and mixed recept and expressive aphasia and admitted with acute left thalamocapsular ICH, severe HTN. PMH includes EtOH and tobacco abuse  10/06 Admitted 10/08 Cortrak placed(gastric) 10/13 repeat CT head showed stable L thalamic hemorrhageand intraventricular extension since 05/08/2020. Regional edema, mild regional mass-effect and mild lateral ventriculomegaly and trace SAH. 10/19 failed MBS 10/22 s/p  PEG 11/22 diet advanced to dysphagia 2 with thin liquids  Pt's global aphasia is improving per MD; however, pt provided no responses to RD questions today.   Discussed pt with RN and nurse tech. Pt typically does not do well with breakfast tray due to lethargy. Will attempt to get breakfast tray delivered earlier. Discussed importance of staff assistance with feeding -- RN and nurse tech both expressed understanding. Per RN and tech, pt does do well with his other meals throughout the day. Meal consumption charted as 0-100% completion x last 6 recorded meals (47.5% average intake). He is currently receiving Ensure Enlive/Plus po TID, but neither the nurse nor the tech could speak towards his consumption of the supplements. Recommend continuing nocturnal tube feeding to help pt meet his kcal/protein needs until he can consistently meet >75% of his needs PO. Will also continue sending oral nutrition supplements.   Current nocturnal TF regimen via PEG: Osmolite 1.5 @ 41ml/hr for 12 hours from 1800-0600, 75ml Prosource TF TID, free water Q4H  Nocturnal TF regimen will provides 1380 kcals (meets 76% estimated minimum calorie needs), 86 grams of protein (meets 100% minimum protein needs), free water ( total free water)  UOP: x24 hours  Pt's last wt was taken on 11/19. Need new measured wt.   Labs: K+ 3.4 (L), CBGs [phone] Medications: ss novolog Q4H, 5 units lantus daily, senokot  Diet Order:   Diet Order            DIET DYS 2 Room service appropriate? No; Fluid consistency: Thin  Diet effective now                 EDUCATION NEEDS:  Not appropriate for education at this time  Skin:  Skin Assessment: Reviewed RN Assessment Skin Integrity Issues:: Stage II, Other (Comment) Stage II: R buttocks Other: puncture abdomen  Last BM:  11/30  Height:   Ht Readings from Last 1 Encounters:  04/09/20 5\' 6"  (1.676 m)    Weight:   Wt Readings from Last 1  Encounters:  06/18/20 60 kg   BMI:  Body mass index is 21.35 kg/m.  Estimated Nutritional Needs:   Kcal:  1780-1980 kcals  Protein:  85-95 g  Fluid:  >/= 1.8 L    06/20/20, MS, RD, LDN RD pager number and weekend/on-call pager number located in [place].

## 2020-06-29 NOTE — TOC Progression Note (Signed)
Transition of Care [facility]) - Progression Note    Patient Details  Name: Alan Everett MRN: [id number] Date of Birth: Oct 26, 1955  [facility]) CM/SW Contact  Kermit Balo, RN Phone Number: 06/29/2020, 1:23 PM  Clinical Narrative:    Pt continues to have medicaid pending and no bed offers.  TOC following.   Expected Discharge Plan: Skilled Nursing Facility Barriers to Discharge: Continued Medical Work up, SNF Pending payor source - [facility], SNF Pending bed offer, SNF Pending Medicaid  Expected Discharge Plan and Services Expected Discharge Plan: Skilled Nursing Facility     Post Acute Care Choice: Skilled Nursing Facility Living arrangements for the past 2 months: Single Family Home                                       Social Determinants of Health (SDOH) Interventions    Readmission Risk Interventions No flowsheet data found.

## 2020-06-29 NOTE — Progress Notes (Signed)
Occupational Therapy Treatment Patient Details Name: Alan Everett MRN: [id number] DOB: Dec 19, 1955 Today's Date: 06/29/2020    History of present illness Patient is a 64 y/o male with PMH ETOH use, tobacco abuse, admitted with R side weakness,sensory loss and speech deficits.  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   OT comments  Pt seen in conjunction with PT to maximize pts activity tolerance. Pt continues to present with R sided weakness/ neglect, decreased activity tolerance, impaired balance and cognitive deficits impacting pts ability to complete BADLs. Pt currently requires MAX A +2 for OOB transfers with use of stedy, up to MAX A for standing balance, total A for LB ADLs and MAX A for UB ADLs. Pt continues to present with cognitive impairments in the areas of Safety/judgement, awareness, problem solving, following commands, memory and attention. Pt would continue to benefit from skilled occupational therapy while admitted and after d/c to address the below listed limitations in order to improve overall functional mobility and facilitate independence with BADL participation. DC plan remains appropriate, will follow acutely per POC.    Follow Up Recommendations  CIR;Supervision/Assistance - 24 hour    Equipment Recommendations  Wheelchair (measurements OT);Wheelchair cushion (measurements OT);Hospital bed;Other (comment) (lift equipment)    Recommendations for Other Services      Precautions / Restrictions Precautions Precautions: Fall Precaution Comments: G tube, right neglect, prevalon boot Required Braces or Orthoses: Splint/Cast Splint/Cast: soft pillow splint R UE at night       Mobility Bed Mobility Overal bed mobility: Needs Assistance Bed Mobility: Rolling;Sidelying to Sit Rolling: Mod assist Sidelying to sit: +2 for safety/equipment;Mod assist       General bed mobility comments: pt required MOD A to  roll R<>L for posterior pericare needing step by steps cues to sequecne steps. pt required assist to maneuver BLEs off of bed and MOD A to elevate trunk into sitting  Transfers Overall transfer level: Needs assistance Equipment used: Ambulation equipment used Transfers: Sit to/from [employer] Transfers Sit to Stand: +2 physical assistance;+2 safety/equipment;Max assist         General transfer comment: pt requried MAX A +2 to power into standing from EOB and assist to position RUE on stedy. pt required MAX A to maintain static standing in stedy with a tendency to lean to pts R side needing up to MAX A to self correct standing posture    Balance Overall balance assessment: Needs assistance Sitting-balance support: Single extremity supported;Feet supported Sitting balance-Leahy Scale: Poor Sitting balance - Comments: pt required LUE on bed rial to maintain static sitting with close min guard   Standing balance support: Bilateral upper extremity supported Standing balance-Leahy Scale: Poor Standing balance comment: required BUE support and external assist                           ADL either performed or assessed with clinical judgement   ADL Overall ADL's : Needs assistance/impaired                 Upper Body Dressing : Maximal assistance;Bed level Upper Body Dressing Details (indicate cue type and reason): MAX A to don new gown from bed level     Toilet Transfer: Maximal assistance;+2 for physical assistance Toilet Transfer Details (indicate cue type and reason): simulated to recliner with stedy Toileting- Clothing Manipulation and Hygiene: Total assistance;Bed level Toileting - Clothing Manipulation Details (indicate cue type and reason):  posterior pericare from bed level     Functional mobility during ADLs: Maximal assistance;+2 for physical assistance General ADL Comments: Pt continues to be limited by R sided neglect, decreased activity tolerance, R  sided weakness, impaired balance and cognitive deficits impacting pts ability to complete ADLs     Vision Patient Visual Report: Other (comment) (R sided nelgect) Vision Assessment?: Vision impaired- to be further tested in functional context   Perception     Praxis      Cognition Arousal/Alertness: Awake/alert Behavior During Therapy: WFL for tasks assessed/performed Overall Cognitive Status: Impaired/Different from baseline Area of Impairment: Safety/judgement;Awareness;Problem solving;Following commands;Memory;Attention                   Current Attention Level: Sustained Memory: Decreased short-term memory Following Commands: Follows one step commands inconsistently;Follows one step commands with increased time Safety/Judgement: Decreased awareness of safety;Decreased awareness of deficits Awareness: Emergent Problem Solving: Slow processing;Requires verbal cues;Requires tactile cues;Difficulty sequencing;Decreased initiation General Comments: upon arrival pt lying sideways in bed with little awareness to safety implications related to bed positioning. pt following commands with increased time but seemed to be more affected by aphasia this session noted to speak unintelligibly at times        Exercises General Exercises - Upper Extremity Shoulder Flexion: Self ROM;Both;5 reps;Supine Elbow Flexion: PROM;5 reps;Right;Supine Elbow Extension: PROM;Right;Supine;5 reps   Shoulder Instructions       General Comments      Pertinent Vitals/ Pain       Pain Assessment: Faces Faces Pain Scale: Hurts little more Pain Location: R hip and knee with extension Pain Descriptors / Indicators: Discomfort;Grimacing;Guarding;Moaning Pain Intervention(s): Limited activity within patient's tolerance;Monitored during session;Repositioned  Home Living                                          Prior Functioning/Environment              Frequency  Min 2X/week         Progress Toward Goals  OT Goals(current goals can now be found in the care plan section)  Progress towards OT goals: Progressing toward goals  Acute Rehab OT Goals Patient Stated Goal: to get better OT Goal Formulation: Patient unable to participate in goal setting Time For Goal Achievement: 07/09/20 Potential to Achieve Goals: Good  Plan Discharge plan remains appropriate;Frequency remains appropriate    Co-evaluation      Reason for Co-Treatment: Complexity of the patient's impairments (multi-system involvement);For patient/therapist safety;Necessary to address cognition/behavior during functional activity          AM-PAC OT "6 Clicks" Daily Activity     Outcome Measure   Help from another person eating meals?: Total (G tube) Help from another person taking care of personal grooming?: A Lot Help from another person toileting, which includes using toliet, bedpan, or urinal?: Total Help from another person bathing (including washing, rinsing, drying)?: Total Help from another person to put on and taking off regular upper body clothing?: Total Help from another person to put on and taking off regular lower body clothing?: Total 6 Click Score: 7    End of Session Equipment Utilized During Treatment: Gait belt;Other (comment) (stedy)  OT Visit Diagnosis: Unsteadiness on feet (R26.81);Muscle weakness (generalized) (M62.81) Hemiplegia - Right/Left: Right Hemiplegia - dominant/non-dominant: Dominant   Activity Tolerance Patient tolerated treatment well   Patient Left in chair;with call bell/phone  within reach;with chair alarm set   Nurse Communication Mobility status        Time: 2505-3976 OT Time Calculation (min): 32 min  Charges: OT General Charges $OT Visit: 1 Visit OT Treatments $Self Care/Home Management : 8-22 mins   Audery Amel., COTA/L Acute Rehabilitation Services [phone]    Angelina Pih 06/29/2020, 11:29 AM

## 2020-06-29 NOTE — Progress Notes (Signed)
[facility] PROGRESS NOTE  Alan Everett YQM:[id number] DOB: [birth year] DOA: 05/05/2020 PCP: Patient, No Pcp Per    11/16   Status: Remains inpatient appropriate because:Altered mental status and Unsafe d/c plan   Dispo: The patient is from: Home              Anticipated d/c is to: SNF vs home with family and home health services              Anticipated d/c date is: > 3 days              Patient currently is medically stable to d/c.  No expectation of full recovery from neurological injury with recommendation for long-term care at skilled nursing facility.  Needs insurance approval, disability and likely Medicaid to ensure placement at SNF-still waiting on needs to complete Medicaid paperwork -11/19 TOC to follow-up with niece   Code Status: Full Family Communication: Patient only-no family at bedside DVT prophylaxis: SCDs only-presented with intraventricular hemorrhage resulting in neurological injury Vaccination status: We will need to discuss with family if patient has previously received Covid vaccination  Foley catheter: No  HPI: 64 year old male patient with history of iron deficiency anemia, alcohol use, cocaine use and tobacco abuse.  Patient presented to the ER by EMS on 10/6 as a code stroke.  At that time he was noted to have acute onset right-sided weakness, sensory loss and aphasia.  CT of the head revealed an enlarging left thalamic capsular intraparenchymal hemorrhage with increasing intraventricular hemorrhage from left ventricle into the third and fourth ventricles.  Neurosurgery consulted but no indication at that time for EVD.  Urine drug screen was found to be positive for cocaine.  Since admission hospital course has been complicated by pneumonia and recurrent blood loss anemia.  It has been noted that in the outpatient setting he had undergone EGD and colonoscopy on 8/2 at [facility] in [place].  No significant abnormalities identified and patient was  recommended to continue previous iron.  As of 11/30 communication in regards to global aphasia is improving.  He also has moderate to severe dysphagia and is requiring feedings via a PEG tube.  Subjective: Awake.  Notable significant increasing ability to communicate effectively.  When asked how he was feeling he reported "I am feeling better today".  When asked about his breakfast tray he stated "I really did not eat much because it did not taste good".  Objective: Vitals:   06/29/20 0400 06/29/20 0735  BP: 128/67 109/70  Pulse: 91 85  Resp: 18 18  Temp: 98.1 F (36.7 C) 98.4 F (36.9 C)  SpO2: 100% 100%    Intake/Output Summary (Last 24 hours) at 06/29/2020 1046 Last data filed at 06/29/2020 0400 Gross per 24 hour  Intake --  Output 500 ml  Net -500 ml   Filed Weights   06/15/20 0407 06/17/20 0500 06/18/20 0453  Weight: 59.7 kg 59.3 kg 60 kg    Exam:  Constitutional: NAD, calm, comfortable Respiratory: Lungs clear to auscultation bilaterally, no tachypnea.  RA Cardiovascular: Regular rate and rhythm at rest, adequate capillary refill, no peripheral edema. Abdomen: no tenderness, Bowel sounds positive.  PEG tube tube for feeding.  Eating between 50 and 75% of meals Musculoskeletal: RUE neglect/intermittent issues with persistent flexion of RUE Skin: Buttock decubitus examined today and appears to be granulating in well Neurologic: CN 2-12 appears to be grossly intact except for notable global aphasia. Sensation intact, Strength 4/5 left side-right side with neglect  and paralysis.   Psychiatric: Alert, oriented times name and place.  Verbal communication markedly improved with appropriate verbal response and timing to questions asked today   Assessment/Plan: Acute problems: Acute Hemorrhagic stroke/cerebral edema -Etiology 2/2 severe hypertension, alcohol and cocaine use. -Because of IVH, neurosurgery was consulted. EVD was deferred.  -CT head: stable findings.  -As  of 11/30 improvements regarding global aphasia, still having some difficulty following commands but speech otherwise significantly improved -Not on antiplatelet tx secondary to hemorrhagic etiology. LDL 80. -Continue PT and OT.  Noted with improved static and dynamic sitting balance.  Moderate to severe dysphagia 2/2 stroke/severe protein calorie malnutrition -Secondary to stroke. PEG tube placed on 05/21/2020. Nutrition Problem: Severe Malnutrition Etiology: social / environmental circumstances Signs/Symptoms: severe fat depletion, severe muscle depletion Interventions: Tube feeding currently tolerating mechanical soft diet with thin liquids intake.  Also on nocturnal tube feedings. Estimated body mass index is 21.35 kg/m as calculated from the following:   Height as of 04/09/20: 5\' 6"  (1.676 m).   Weight as of this encounter: 60 kg.   Acute on chronic anemia -Total of 6 units of PRBC transfusions given in this admission current hemoglobin stable between 8 and 9. -GI consulted. -s/p panendoscopy 03/01/2020 as an OP:  Colonoscopy-sigmoid diverticulosis EGD-normal esophagus, gastritis, normal duodenum and no other abnormal findings -Continue PPI twice daily. (See below regarding history of gastritis on recent EGD)  Stage II buttock decubitus Wound / Incision (Open or Dehisced) 05/21/20 Puncture Abdomen Left;Anterior;Upper G-tube insertion site  (Active)  Date First Assessed/Time First Assessed: 05/21/20 1533   Wound Type: Puncture  Location: Abdomen  Location Orientation: Left;Anterior;Upper  Wound Description (Comments): G-tube insertion site   Present on Admission: No    Assessments 05/21/2020  3:34 PM 06/27/2020  8:10 AM  Dressing Type Gauze (Comment);Tape dressing Gauze (Comment)  Dressing Changed New New  Dressing Status Clean;Dry;Intact Dry;Intact  Dressing Change Frequency PRN PRN  Site / Wound Assessment Clean;Dry Dry;Clean  Peri-wound Assessment Intact Intact  Closure None  --  Drainage Amount None --  Treatment Cleansed --     No Linked orders to display     Pressure Injury 06/02/20 Buttocks Right Stage 2 -  Partial thickness loss of dermis presenting as a shallow open injury with a red, pink wound bed without slough. 7.5 cm in lenth by 4.5 width  (Active)  Date First Assessed/Time First Assessed: 06/02/20 0800   Location: Buttocks  Location Orientation: Right  Staging: Stage 2 -  Partial thickness loss of dermis presenting as a shallow open injury with a red, pink wound bed without slough.  Wound Descri...    Assessments 06/02/2020  8:00 AM 06/28/2020  8:00 AM  Dressing Type -- Foam - Lift dressing to assess site every shift  Dressing -- Intact  Dressing Change Frequency -- Every 3 days  Site / Wound Assessment -- Dry  Wound Length (cm) 7.5 cm --  Wound Width (cm) 4.5 cm --  Wound Surface Area (cm^2) 33.75 cm^2 --  Drainage Amount None --  Treatment Other (Comment) --     No Linked orders to display      Other problems: Goals of care -Poor neurological recovery so far.  Do not expect he will return to baseline level of functioning and likely will require long-term skilled nursing care. -Palliative care was consulted in the past. Patient remains full code.   Bibasilar pneumonia -resolved -CT scan 10/12 showed bibasilar pneumonia.  -Treated with broad-spectrum antibiotics.  -WBC normalized.  No longer having fever.  Klebsiella UTI  -resolved -Urine culture report from 10/10 with more than 1000 CFU per mL of Klebsiella pneumoniae.  -Completed 5-day course of IV Rocephin.   Sinus tachycardia Essential hypertension/grade 1 diastolic dysfunction grade 1 diastolic dysfunction -Heart rate and blood pressure both are stable on Coreg 3.125 mg daily. -Echocardiogram this admission revealed preserved LVEF with physiologic findings consistent with diastolic dysfunction  Hypernatremia  - resolved   Elevated liver enzymes -Right upper quadrant  ultrasound-cholelithiasis without acute cholecystitis -Chronic mild elevation of liver enzymes persist.  Tobacco Abuse -Cessation counseling this admission deferred in the context of abnormal neurological status and patient unable to participate  Cocaine abuse -Cessation counseling this admission deferred in the context of abnormal neurological status and patient unable to participate  GERD/recent gastritis on endoscopy August 2021 -PPI twice daily  Data Reviewed: Basic Metabolic Panel: No results for input(s): NA, K, CL, CO2, GLUCOSE, BUN, CREATININE, CALCIUM, MG, PHOS in the last 168 hours. Liver Function Tests: No results for input(s): AST, ALT, ALKPHOS, BILITOT, PROT, ALBUMIN in the last 168 hours. No results for input(s): LIPASE, AMYLASE in the last 168 hours. No results for input(s): AMMONIA in the last 168 hours. CBC: No results for input(s): WBC, NEUTROABS, HGB, HCT, MCV, PLT in the last 168 hours. Cardiac Enzymes: No results for input(s): CKTOTAL, CKMB, CKMBINDEX, TROPONINI in the last 168 hours. BNP (last 3 results) Recent Labs    05/29/20 0409  BNP 23.2    ProBNP (last 3 results) No results for input(s): PROBNP in the last 8760 hours.  CBG: Recent Labs  Lab 06/28/20 1611 06/28/20 1933 06/28/20 2317 06/29/20 0357 06/29/20 0717  GLUCAP 105* 99 142* 151* 159*    No results found for this or any previous visit (from the past 240 hour(s)).   Studies: No results found.  Scheduled Meds: . carvedilol  6.25 mg Per Tube BID WC  . chlorhexidine  15 mL Mouth Rinse BID  . feeding supplement  237 mL Oral TID BM  . feeding supplement (OSMOLITE 1.5 CAL)  840 mL Per Tube Q24H  . feeding supplement (PROSource TF)  45 mL Per Tube TID  . free water  200 mL Per Tube Q4H  . insulin aspart  0-9 Units Subcutaneous Q4H  . insulin glargine  5 Units Subcutaneous Daily  . mouth rinse  15 mL Mouth Rinse q12n4p  . pantoprazole sodium  40 mg Per Tube BID  . sennosides  5 mL  Per Tube BID  . sodium chloride flush  10-40 mL Intracatheter Q12H   Continuous Infusions:  Principal Problem:   ICH (intracerebral hemorrhage) (HCC) Active Problems:   Polysubstance abuse (HCC)   Dysphagia due to recent cerebrovascular accident (CVA)   Hyperglycemia   Hypokalemia   Hypomagnesemia   Fever   SOB (shortness of breath)   Leukocytosis   Aspiration pneumonia of both lower lobes (HCC)   Palliative care by specialist   Goals of care, counseling/discussion   DNR (do not resuscitate) discussion   Protein-calorie malnutrition, severe   Heme positive stool   Acute lower UTI   Acute blood loss anemia   Pressure injury of skin   Consultants:  Neurology  Gastroenterology  Procedures:  Echocardiogram  Core track trach tube  PEG tube  Antibiotics: Anti-infectives (From admission, onward)   Start     Dose/Rate Route Frequency Ordered Stop   05/28/20 0900  cefTRIAXone (ROCEPHIN) 1 g in sodium chloride 0.9 % 100 mL IVPB  1 g 200 mL/hr over 30 Minutes Intravenous Every 24 hours 05/28/20 0807 06/01/20 0855   05/21/20 1515  ceFAZolin (ANCEF) IVPB 2g/100 mL premix        2 g 200 mL/hr over 30 Minutes Intravenous To Radiology 05/21/20 1429 05/21/20 2100   05/14/20 1000  cefTRIAXone (ROCEPHIN) 2 g in sodium chloride 0.9 % 100 mL IVPB        2 g 200 mL/hr over 30 Minutes Intravenous Daily 05/13/20 1454 05/15/20 1255   05/13/20 2200  metroNIDAZOLE (FLAGYL) tablet 500 mg        500 mg Per Tube Every 8 hours 05/13/20 1847 05/15/20 2219   05/13/20 1400  metroNIDAZOLE (FLAGYL) tablet 500 mg  Status:  Discontinued        500 mg Oral Every 8 hours 05/13/20 0935 05/13/20 1847   05/12/20 0900  cefTRIAXone (ROCEPHIN) 1 g in sodium chloride 0.9 % 100 mL IVPB  Status:  Discontinued        1 g 200 mL/hr over 30 Minutes Intravenous Daily 05/12/20 0817 05/13/20 1454   05/12/20 0900  azithromycin (ZITHROMAX) 500 mg in sodium chloride 0.9 % 250 mL IVPB  Status:  Discontinued         500 mg 250 mL/hr over 60 Minutes Intravenous Daily 05/12/20 0817 05/13/20 0935   05/09/20 0930  Ampicillin-Sulbactam (UNASYN) 3 g in sodium chloride 0.9 % 100 mL IVPB  Status:  Discontinued        3 g 200 mL/hr over 30 Minutes Intravenous Every 8 hours 05/09/20 0837 05/09/20 0839   05/09/20 0930  Ampicillin-Sulbactam (UNASYN) 3 g in sodium chloride 0.9 % 100 mL IVPB  Status:  Discontinued        3 g 200 mL/hr over 30 Minutes Intravenous Every 6 hours 05/09/20 0839 05/12/20 0817       Time spent: 20 minutes    Junious Silk ANP  [facility] Pager [phone]. If 7PM-7AM, please contact night-coverage at [email or web address] 06/29/2020, 10:46 AM  LOS: 55 days

## 2020-06-30 LAB — GLUCOSE, CAPILLARY
Glucose-Capillary: 149 mg/dL — ABNORMAL HIGH (ref 70–99)
Glucose-Capillary: 136 mg/dL — ABNORMAL HIGH (ref 70–99)
Glucose-Capillary: 119 mg/dL — ABNORMAL HIGH (ref 70–99)
Glucose-Capillary: 97 mg/dL (ref 70–99)
Glucose-Capillary: 134 mg/dL — ABNORMAL HIGH (ref 70–99)
Glucose-Capillary: 99 mg/dL (ref 70–99)

## 2020-06-30 NOTE — Progress Notes (Signed)
°  Speech Language Pathology Treatment: Cognitive-Linquistic  Patient Details Name: Alan Everett MRN: [id number] DOB: 08/29/1955 Today's Date: 06/30/2020 Time: 0805-0820 SLP Time Calculation (min) (ACUTE ONLY): 15 min  Assessment / Plan / Recommendation Clinical Impression  Pt was seen for cognitive-linguistic treatment. He was attentive to the SLP but his responsiveness to tasks decreased throughout the session. He named objects with 100% accuracy and min verbal cues and answered yes/no questions with 70% accuracy and mod verbal cues. Pt was unable to repeat any two-word phrases during the session despite direct modeling and multimodal cueing. However, he stated the phrase "thank you" independently during conversation. His intelligibility at the word level was ~ 80%. As utterance length increases, his intelligibility decreases. SLP will continue to f/u to address aphasia.   HPI HPI: Alan Everett is a 64 y.o. male with a PMHx of alcohol use and tobacco abuse who presents acutely to the ED via EMS as a Code Stroke for acute onset of right sided weakness. Patient with disoriented, right leg weakness, right limb ataxia, right decreased sensation and Global aphasia on exam. 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 Worsening of intraventricular extension.  MBS 10/19 mod-severe dysphagia, continue NPO. PEG 10/22.  Repeat MBS 10/30 with improvements. Started dys1/nectar 11/2.       SLP Plan  Continue with current plan of care       Recommendations                   Oral Care Recommendations: Oral care BID Follow up Recommendations: Skilled Nursing facility;Inpatient Rehab SLP Visit Diagnosis: Aphasia (R47.01) Plan: Continue with current plan of care       GO                [name] 06/30/2020, 8:26 AM

## 2020-06-30 NOTE — Progress Notes (Addendum)
[facility] PROGRESS NOTE  Alan Everett TGY:[id number] DOB: 1956/02/18 DOA: 05/05/2020 PCP: Patient, No Pcp Per    11/16   Status:  Remains inpatient appropriate because:Altered mental status and Unsafe d/c plan  Dispo:  The patient is from: Home              Anticipated d/c is to: SNF vs home with family and home health services              Anticipated d/c date is: > 3 days              Patient currently is medically stable to d/c.  No expectation of full recovery from neurological injury with recommendation for long-term care at skilled nursing facility.     Code Status: Full Family Communication: Patient only-no family at bedside DVT prophylaxis: SCDs only-presented with intraventricular hemorrhage resulting in neurological injury Vaccination status: We will need to discuss with family if patient has previously received Covid vaccination  Foley catheter: No  HPI: 64 year old male patient with history of iron deficiency anemia, alcohol use, cocaine use and tobacco abuse.  Patient presented to the ER by EMS on 10/6 as a code stroke.  At that time he was noted to have acute onset right-sided weakness, sensory loss and aphasia.  CT of the head revealed an enlarging left thalamic capsular intraparenchymal hemorrhage with increasing intraventricular hemorrhage from left ventricle into the third and fourth ventricles.  Neurosurgery consulted but no indication at that time for EVD.  Urine drug screen was found to be positive for cocaine.  Since admission hospital course has been complicated by pneumonia and recurrent blood loss anemia.  It has been noted that in the outpatient setting he had undergone EGD and colonoscopy on 8/2 at [facility] in [place].  No significant abnormalities identified and patient was recommended to continue previous iron.  As of 11/30 communication in regards to global aphasia is improving.  He also has moderate to severe dysphagia and is requiring  feedings via a PEG tube.  Subjective: Sleeping.  According to SLP notes patient had an excellent session today with significant improvement in linguistics.  Objective: Vitals:   06/30/20 0321 06/30/20 0745  BP: 126/61 115/73  Pulse: 87 91  Resp: 18 18  Temp: 97.8 F (36.6 C) 98.6 F (37 C)  SpO2: 100% 100%   No intake or output data in the 24 hours ending 06/30/20 1210 Filed Weights   06/15/20 0407 06/17/20 0500 06/18/20 0453  Weight: 59.7 kg 59.3 kg 60 kg    Exam:  Constitutional: NAD, sleeping Respiratory: Lungs clear to auscultation bilaterally, no tachypnea.  RA Cardiovascular: Regular rate and rhythm at rest, adequate capillary refill, no peripheral edema. Abdomen: no tenderness, Bowel sounds positive.  PEG tube tube for feeding.  Eating between 50 and 75% of meals Musculoskeletal: RUE neglect/intermittent issues with persistent flexion of RUE Neurologic: Sleeping but current baseline is CN 2-12 appears to be grossly intact except for notable global aphasia. Sensation intact, Strength 4/5 left side-right side with neglect and paralysis.   Psychiatric: Sleeping but current baseline is oriented x name and place.  Verbal communication markedly improved    Assessment/Plan: Acute problems: Acute Hemorrhagic stroke/cerebral edema -Etiology 2/2 severe hypertension, alcohol and cocaine use. -Neurosurgery was consulted no indications for surgery present -Follow-up CT head stable.  -As of 11/30 improvements regarding global aphasia, still having some difficulty following commands but speech otherwise significantly improved -Not on antiplatelet tx secondary to hemorrhagic etiology. LDL  80. -Continue PT and OT.  Noted with improved static and dynamic sitting balance.  Moderate to severe dysphagia 2/2 stroke/severe protein calorie malnutrition -Secondary to stroke. PEG tube placed on 05/21/2020. Nutrition Problem: Severe Malnutrition Etiology: social / environmental  circumstances Signs/Symptoms: severe fat depletion, severe muscle depletion Interventions: Tube feeding currently tolerating mechanical soft diet with thin liquids intake.  Also on nocturnal tube feedings. Estimated body mass index is 21.35 kg/m as calculated from the following:   Height as of 04/09/20: 5\' 6"  (1.676 m).   Weight as of this encounter: 60 kg.  LBM 11/30  Acute on chronic anemia -Total of 6 units of PRBC transfusions given in this admission current hemoglobin stable between 8 and 9. -GI consulted. -s/p panendoscopy 03/01/2020 as an OP:  Colonoscopy-sigmoid diverticulosis EGD-normal esophagus, gastritis, normal duodenum and no other abnormal findings -Continue PPI twice daily. (See below regarding history of gastritis on recent EGD)  Stage II buttock decubitus Wound / Incision (Open or Dehisced) 05/21/20 Puncture Abdomen Left;Anterior;Upper G-tube insertion site  (Active)  Date First Assessed/Time First Assessed: 05/21/20 1533   Wound Type: Puncture  Location: Abdomen  Location Orientation: Left;Anterior;Upper  Wound Description (Comments): G-tube insertion site   Present on Admission: No    Assessments 05/21/2020  3:34 PM 06/27/2020  8:10 AM  Dressing Type Gauze (Comment);Tape dressing Gauze (Comment)  Dressing Changed New New  Dressing Status Clean;Dry;Intact Dry;Intact  Dressing Change Frequency PRN PRN  Site / Wound Assessment Clean;Dry Dry;Clean  Peri-wound Assessment Intact Intact  Closure None --  Drainage Amount None --  Treatment Cleansed --     No Linked orders to display     Pressure Injury 06/02/20 Buttocks Right Stage 2 -  Partial thickness loss of dermis presenting as a shallow open injury with a red, pink wound bed without slough. 7.5 cm in lenth by 4.5 width  (Active)  Date First Assessed/Time First Assessed: 06/02/20 0800   Location: Buttocks  Location Orientation: Right  Staging: Stage 2 -  Partial thickness loss of dermis presenting as a shallow open  injury with a red, pink wound bed without slough.  Wound Descri...    Assessments 06/02/2020  8:00 AM 06/28/2020  8:00 AM  Dressing Type -- Foam - Lift dressing to assess site every shift  Dressing -- Intact  Dressing Change Frequency -- Every 3 days  Site / Wound Assessment -- Dry  Wound Length (cm) 7.5 cm --  Wound Width (cm) 4.5 cm --  Wound Surface Area (cm^2) 33.75 cm^2 --  Drainage Amount None --  Treatment Other (Comment) --     No Linked orders to display      Other problems: Goals of care -Poor neurological recovery so far.  Do not expect he will return to baseline level of functioning and likely will require long-term skilled nursing care. -Palliative care was consulted in the past. Patient remains full code.   Bibasilar pneumonia -resolved -CT scan 10/12 showed bibasilar pneumonia.  -Treated with broad-spectrum antibiotics.  -WBC normalized. No longer having fever.  Klebsiella UTI  -resolved -Urine culture report from 10/10 with more than 1000 CFU per mL of Klebsiella pneumoniae.  -Completed 5-day course of IV Rocephin.   Sinus tachycardia Essential hypertension/grade 1 diastolic dysfunction grade 1 diastolic dysfunction -Heart rate and blood pressure both are stable on Coreg 3.125 mg daily. -Echocardiogram this admission revealed preserved LVEF with physiologic findings consistent with diastolic dysfunction  Hypernatremia  - resolved   Elevated liver enzymes -Right upper quadrant  ultrasound-cholelithiasis without acute cholecystitis -Chronic mild elevation of liver enzymes persist.  Tobacco Abuse -Cessation counseling this admission deferred in the context of abnormal neurological status and patient unable to participate  Cocaine abuse -Cessation counseling this admission deferred in the context of abnormal neurological status and patient unable to participate  GERD/recent gastritis on endoscopy August 2021 -PPI twice daily  Data  Reviewed: Basic Metabolic Panel: No results for input(s): NA, K, CL, CO2, GLUCOSE, BUN, CREATININE, CALCIUM, MG, PHOS in the last 168 hours. Liver Function Tests: No results for input(s): AST, ALT, ALKPHOS, BILITOT, PROT, ALBUMIN in the last 168 hours. No results for input(s): LIPASE, AMYLASE in the last 168 hours. No results for input(s): AMMONIA in the last 168 hours. CBC: No results for input(s): WBC, NEUTROABS, HGB, HCT, MCV, PLT in the last 168 hours. Cardiac Enzymes: No results for input(s): CKTOTAL, CKMB, CKMBINDEX, TROPONINI in the last 168 hours. BNP (last 3 results) Recent Labs    05/29/20 0409  BNP 23.2    ProBNP (last 3 results) No results for input(s): PROBNP in the last 8760 hours.  CBG: Recent Labs  Lab 06/29/20 1613 06/29/20 2004 06/29/20 2346 06/30/20 0322 06/30/20 0747  GLUCAP 102* 129* 142* 149* 136*    No results found for this or any previous visit (from the past 240 hour(s)).   Studies: No results found.  Scheduled Meds: . carvedilol  6.25 mg Per Tube BID WC  . chlorhexidine  15 mL Mouth Rinse BID  . feeding supplement  237 mL Oral TID BM  . feeding supplement (OSMOLITE 1.5 CAL)  840 mL Per Tube Q24H  . feeding supplement (PROSource TF)  45 mL Per Tube TID  . free water  200 mL Per Tube Q4H  . insulin aspart  0-9 Units Subcutaneous Q4H  . insulin glargine  5 Units Subcutaneous Daily  . mouth rinse  15 mL Mouth Rinse q12n4p  . nutrition supplement (JUVEN)  1 packet Per Tube BID BM  . pantoprazole sodium  40 mg Per Tube BID  . sennosides  5 mL Per Tube BID  . sodium chloride flush  10-40 mL Intracatheter Q12H   Continuous Infusions:  Principal Problem:   ICH (intracerebral hemorrhage) (HCC) Active Problems:   Polysubstance abuse (HCC)   Dysphagia due to recent cerebrovascular accident (CVA)   Hyperglycemia   Hypokalemia   Hypomagnesemia   Fever   SOB (shortness of breath)   Leukocytosis   Aspiration pneumonia of both lower lobes  (HCC)   Palliative care by specialist   Goals of care, counseling/discussion   DNR (do not resuscitate) discussion   Protein-calorie malnutrition, severe   Heme positive stool   Acute lower UTI   Acute blood loss anemia   Pressure injury of skin   Consultants:  Neurology  Gastroenterology  Procedures:  Echocardiogram  Core track trach tube  PEG tube  Antibiotics: Anti-infectives (From admission, onward)   Start     Dose/Rate Route Frequency Ordered Stop   05/28/20 0900  cefTRIAXone (ROCEPHIN) 1 g in sodium chloride 0.9 % 100 mL IVPB        1 g 200 mL/hr over 30 Minutes Intravenous Every 24 hours 05/28/20 0807 06/01/20 0855   05/21/20 1515  ceFAZolin (ANCEF) IVPB 2g/100 mL premix        2 g 200 mL/hr over 30 Minutes Intravenous To Radiology 05/21/20 1429 05/21/20 2100   05/14/20 1000  cefTRIAXone (ROCEPHIN) 2 g in sodium chloride 0.9 % 100 mL IVPB  2 g 200 mL/hr over 30 Minutes Intravenous Daily 05/13/20 1454 05/15/20 1255   05/13/20 2200  metroNIDAZOLE (FLAGYL) tablet 500 mg        500 mg Per Tube Every 8 hours 05/13/20 1847 05/15/20 2219   05/13/20 1400  metroNIDAZOLE (FLAGYL) tablet 500 mg  Status:  Discontinued        500 mg Oral Every 8 hours 05/13/20 0935 05/13/20 1847   05/12/20 0900  cefTRIAXone (ROCEPHIN) 1 g in sodium chloride 0.9 % 100 mL IVPB  Status:  Discontinued        1 g 200 mL/hr over 30 Minutes Intravenous Daily 05/12/20 0817 05/13/20 1454   05/12/20 0900  azithromycin (ZITHROMAX) 500 mg in sodium chloride 0.9 % 250 mL IVPB  Status:  Discontinued        500 mg 250 mL/hr over 60 Minutes Intravenous Daily 05/12/20 0817 05/13/20 0935   05/09/20 0930  Ampicillin-Sulbactam (UNASYN) 3 g in sodium chloride 0.9 % 100 mL IVPB  Status:  Discontinued        3 g 200 mL/hr over 30 Minutes Intravenous Every 8 hours 05/09/20 0837 05/09/20 0839   05/09/20 0930  Ampicillin-Sulbactam (UNASYN) 3 g in sodium chloride 0.9 % 100 mL IVPB  Status:  Discontinued         3 g 200 mL/hr over 30 Minutes Intravenous Every 6 hours 05/09/20 0839 05/12/20 0817       Time spent: 20 minutes    Junious Silk ANP  [facility] Pager [phone].  06/30/2020, 12:10 PM  LOS: 56 days

## 2020-07-01 LAB — GLUCOSE, CAPILLARY
Glucose-Capillary: 108 mg/dL — ABNORMAL HIGH (ref 70–99)
Glucose-Capillary: 110 mg/dL — ABNORMAL HIGH (ref 70–99)
Glucose-Capillary: 147 mg/dL — ABNORMAL HIGH (ref 70–99)
Glucose-Capillary: 113 mg/dL — ABNORMAL HIGH (ref 70–99)
Glucose-Capillary: 111 mg/dL — ABNORMAL HIGH (ref 70–99)

## 2020-07-01 NOTE — Progress Notes (Signed)
[facility] PROGRESS NOTE  Alan Everett HCW:[id number] DOB: 02-26-1956 DOA: 05/05/2020 PCP: Patient, No Pcp Per    11/16   Status:  Remains inpatient appropriate because:Altered mental status and Unsafe d/c plan  Dispo:  The patient is from: Home              Anticipated d/c is to: SNF vs home with family and home health services              Anticipated d/c date is: > 3 days              Patient currently is medically stable to d/c.  No expectation of full recovery from neurological injury with recommendation for long-term care at skilled nursing facility.     Code Status: Full Family Communication: Patient only-no family at bedside DVT prophylaxis: SCDs only-presented with intraventricular hemorrhage resulting in neurological injury Vaccination status: We will need to discuss with family if patient has previously received Covid vaccination  Foley catheter: No  HPI: 64 year old male patient with history of iron deficiency anemia, alcohol use, cocaine use and tobacco abuse.  Patient presented to the ER by EMS on 10/6 as a code stroke.  At that time he was noted to have acute onset right-sided weakness, sensory loss and aphasia.  CT of the head revealed an enlarging left thalamic capsular intraparenchymal hemorrhage with increasing intraventricular hemorrhage from left ventricle into the third and fourth ventricles.  Neurosurgery consulted but no indication at that time for EVD.  Urine drug screen was found to be positive for cocaine.  Since admission hospital course has been complicated by pneumonia and recurrent blood loss anemia.  It has been noted that in the outpatient setting he had undergone EGD and colonoscopy on 8/2 at [facility] in [place].  No significant abnormalities identified and patient was recommended to continue previous iron.  As of 11/30 communication in regards to global aphasia is improving.  He also has moderate to severe dysphagia and is requiring  feedings via a PEG tube.  Subjective: Awake.  Having much more trouble in context of receptive and expressive aphasia today.  Objective: Vitals:   07/01/20 0841 07/01/20 0847  BP: 123/76 123/76  Pulse: 93 93  Resp:  18  Temp:  97.9 F (36.6 C)  SpO2:  100%    Intake/Output Summary (Last 24 hours) at 07/01/2020 1114 Last data filed at 07/01/2020 0900 Gross per 24 hour  Intake 1265 ml  Output --  Net 1265 ml   Filed Weights   06/15/20 0407 06/17/20 0500 06/18/20 0453  Weight: 59.7 kg 59.3 kg 60 kg    Exam:  Constitutional: NAD, calm and appears to be comfortable Respiratory: Lungs clear to auscultation bilaterally, no tachypnea at rest.  RA Cardiovascular: Regular rate and rhythm at rest, adequate capillary refill, no peripheral edema. Abdomen: no tenderness, Bowel sounds positive.  PEG tube tube for feeding.  Had been eating 50 to 75% of meals over the past 24 hours has only eaten 15 to 25% of meals. Musculoskeletal: RUE neglect/intermittent issues with persistent flexion of RUE Neurologic: CN 2-12 appears to be grossly intact except for notable global aphasia. Sensation intact, Strength 4/5 left side-right side with neglect and paralysis.   Psychiatric: Typically he is oriented x name and place.  Verbal communication.  Again today so difficult to ascertain correctly   Assessment/Plan: Acute problems: Acute Hemorrhagic stroke/cerebral edema -Etiology 2/2 severe hypertension, alcohol and cocaine use. -Neurosurgery was consulted no indications for surgery  present -Follow-up CT head stable.  -As of 11/30 improvements regarding global aphasia, still having some difficulty following commands but speech otherwise significantly improved.  As of 12/2 once again having difficulty with global aphasia.  This is not unexpected noting stroke patients tend to have good days and bad days. -Not on antiplatelet tx secondary to hemorrhagic etiology. LDL 80. -Continue PT and OT.  Noted  with improved static and dynamic sitting balance.  Moderate to severe dysphagia 2/2 stroke/severe protein calorie malnutrition -Secondary to stroke. PEG tube placed on 05/21/2020. Nutrition Problem: Severe Malnutrition Etiology: social / environmental circumstances Signs/Symptoms: severe fat depletion, severe muscle depletion Interventions: Tube feeding currently tolerating mechanical soft diet with thin liquids intake.  Also on nocturnal tube feedings.  Oral intake decreasing again.  Patient has previously expressed dislike of foods offered. Estimated body mass index is 21.35 kg/m as calculated from the following:   Height as of 04/09/20: 5\' 6"  (1.676 m).   Weight as of this encounter: 60 kg.  LBM 11/30  Acute on chronic anemia -Total of 6 units of PRBC transfusions given in this admission current hemoglobin stable between 8 and 9. -GI consulted. -s/p panendoscopy 03/01/2020 as an OP:  Colonoscopy-sigmoid diverticulosis EGD-normal esophagus, gastritis, normal duodenum and no other abnormal findings -Continue PPI twice daily. (See below regarding history of gastritis on recent EGD)  Stage II buttock decubitus Wound / Incision (Open or Dehisced) 05/21/20 Puncture Abdomen Left;Anterior;Upper G-tube insertion site  (Active)  Date First Assessed/Time First Assessed: 05/21/20 1533   Wound Type: Puncture  Location: Abdomen  Location Orientation: Left;Anterior;Upper  Wound Description (Comments): G-tube insertion site   Present on Admission: No    Assessments 05/21/2020  3:34 PM 06/27/2020  8:10 AM  Dressing Type Gauze (Comment);Tape dressing Gauze (Comment)  Dressing Changed New New  Dressing Status Clean;Dry;Intact Dry;Intact  Dressing Change Frequency PRN PRN  Site / Wound Assessment Clean;Dry Dry;Clean  Peri-wound Assessment Intact Intact  Closure None --  Drainage Amount None --  Treatment Cleansed --     No Linked orders to display     Pressure Injury 06/02/20 Buttocks Right  Stage 2 -  Partial thickness loss of dermis presenting as a shallow open injury with a red, pink wound bed without slough. 7.5 cm in lenth by 4.5 width  (Active)  Date First Assessed/Time First Assessed: 06/02/20 0800   Location: Buttocks  Location Orientation: Right  Staging: Stage 2 -  Partial thickness loss of dermis presenting as a shallow open injury with a red, pink wound bed without slough.  Wound Descri...    Assessments 06/02/2020  8:00 AM 06/30/2020  8:01 PM  Dressing Type -- Foam - Lift dressing to assess site every shift  Dressing -- Intact  Dressing Change Frequency -- Every 3 days  State of Healing -- Epithelialized  Site / Wound Assessment -- Dry  Wound Length (cm) 7.5 cm --  Wound Width (cm) 4.5 cm --  Wound Surface Area (cm^2) 33.75 cm^2 --  Drainage Amount None --  Treatment Other (Comment) --     No Linked orders to display      Other problems: Goals of care -Poor neurological recovery so far.  Do not expect he will return to baseline level of functioning and likely will require long-term skilled nursing care. -Palliative care was consulted in the past. Patient remains full code.   Bibasilar pneumonia -resolved -CT scan 10/12 showed bibasilar pneumonia.  -Treated with broad-spectrum antibiotics.  -WBC normalized. No longer having  fever.  Klebsiella UTI  -resolved -Urine culture report from 10/10 with more than 1000 CFU per mL of Klebsiella pneumoniae.  -Completed 5-day course of IV Rocephin.   Sinus tachycardia Essential hypertension/grade 1 diastolic dysfunction grade 1 diastolic dysfunction -Heart rate and blood pressure both are stable on Coreg 3.125 mg daily. -Echocardiogram this admission revealed preserved LVEF with physiologic findings consistent with diastolic dysfunction  Hypernatremia  - resolved   Elevated liver enzymes -Right upper quadrant ultrasound-cholelithiasis without acute cholecystitis -Chronic mild elevation of liver enzymes  persist.  Tobacco Abuse -Cessation counseling this admission deferred in the context of abnormal neurological status and patient unable to participate  Cocaine abuse -Cessation counseling this admission deferred in the context of abnormal neurological status and patient unable to participate  GERD/recent gastritis on endoscopy August 2021 -PPI twice daily  Data Reviewed: Basic Metabolic Panel: No results for input(s): NA, K, CL, CO2, GLUCOSE, BUN, CREATININE, CALCIUM, MG, PHOS in the last 168 hours. Liver Function Tests: No results for input(s): AST, ALT, ALKPHOS, BILITOT, PROT, ALBUMIN in the last 168 hours. No results for input(s): LIPASE, AMYLASE in the last 168 hours. No results for input(s): AMMONIA in the last 168 hours. CBC: No results for input(s): WBC, NEUTROABS, HGB, HCT, MCV, PLT in the last 168 hours. Cardiac Enzymes: No results for input(s): CKTOTAL, CKMB, CKMBINDEX, TROPONINI in the last 168 hours. BNP (last 3 results) Recent Labs    05/29/20 0409  BNP 23.2    ProBNP (last 3 results) No results for input(s): PROBNP in the last 8760 hours.  CBG: Recent Labs  Lab 06/30/20 1607 06/30/20 1939 06/30/20 2315 07/01/20 0338 07/01/20 0841  GLUCAP 97 134* 99 108* 110*    No results found for this or any previous visit (from the past 240 hour(s)).   Studies: No results found.  Scheduled Meds: . carvedilol  6.25 mg Per Tube BID WC  . chlorhexidine  15 mL Mouth Rinse BID  . feeding supplement  237 mL Oral TID BM  . feeding supplement (OSMOLITE 1.5 CAL)  840 mL Per Tube Q24H  . feeding supplement (PROSource TF)  45 mL Per Tube TID  . free water  200 mL Per Tube Q4H  . insulin aspart  0-9 Units Subcutaneous Q4H  . insulin glargine  5 Units Subcutaneous Daily  . mouth rinse  15 mL Mouth Rinse q12n4p  . nutrition supplement (JUVEN)  1 packet Per Tube BID BM  . pantoprazole sodium  40 mg Per Tube BID  . sennosides  5 mL Per Tube BID  . sodium chloride flush   10-40 mL Intracatheter Q12H   Continuous Infusions:  Principal Problem:   ICH (intracerebral hemorrhage) (HCC) Active Problems:   Polysubstance abuse (HCC)   Dysphagia due to recent cerebrovascular accident (CVA)   Hyperglycemia   Hypokalemia   Hypomagnesemia   Fever   SOB (shortness of breath)   Leukocytosis   Aspiration pneumonia of both lower lobes (HCC)   Palliative care by specialist   Goals of care, counseling/discussion   DNR (do not resuscitate) discussion   Protein-calorie malnutrition, severe   Heme positive stool   Acute lower UTI   Acute blood loss anemia   Pressure injury of skin   Consultants:  Neurology  Gastroenterology  Procedures:  Echocardiogram  Core track trach tube  PEG tube  Antibiotics: Anti-infectives (From admission, onward)   Start     Dose/Rate Route Frequency Ordered Stop   05/28/20 0900  cefTRIAXone (ROCEPHIN) 1  g in sodium chloride 0.9 % 100 mL IVPB        1 g 200 mL/hr over 30 Minutes Intravenous Every 24 hours 05/28/20 0807 06/01/20 0855   05/21/20 1515  ceFAZolin (ANCEF) IVPB 2g/100 mL premix        2 g 200 mL/hr over 30 Minutes Intravenous To Radiology 05/21/20 1429 05/21/20 2100   05/14/20 1000  cefTRIAXone (ROCEPHIN) 2 g in sodium chloride 0.9 % 100 mL IVPB        2 g 200 mL/hr over 30 Minutes Intravenous Daily 05/13/20 1454 05/15/20 1255   05/13/20 2200  metroNIDAZOLE (FLAGYL) tablet 500 mg        500 mg Per Tube Every 8 hours 05/13/20 1847 05/15/20 2219   05/13/20 1400  metroNIDAZOLE (FLAGYL) tablet 500 mg  Status:  Discontinued        500 mg Oral Every 8 hours 05/13/20 0935 05/13/20 1847   05/12/20 0900  cefTRIAXone (ROCEPHIN) 1 g in sodium chloride 0.9 % 100 mL IVPB  Status:  Discontinued        1 g 200 mL/hr over 30 Minutes Intravenous Daily 05/12/20 0817 05/13/20 1454   05/12/20 0900  azithromycin (ZITHROMAX) 500 mg in sodium chloride 0.9 % 250 mL IVPB  Status:  Discontinued        500 mg 250 mL/hr over 60  Minutes Intravenous Daily 05/12/20 0817 05/13/20 0935   05/09/20 0930  Ampicillin-Sulbactam (UNASYN) 3 g in sodium chloride 0.9 % 100 mL IVPB  Status:  Discontinued        3 g 200 mL/hr over 30 Minutes Intravenous Every 8 hours 05/09/20 0837 05/09/20 0839   05/09/20 0930  Ampicillin-Sulbactam (UNASYN) 3 g in sodium chloride 0.9 % 100 mL IVPB  Status:  Discontinued        3 g 200 mL/hr over 30 Minutes Intravenous Every 6 hours 05/09/20 0839 05/12/20 0817       Time spent: 20 minutes    Junious Silkllison Marvis Bakken ANP  [facility] Pager [phone].  07/01/2020, 11:14 AM  LOS: 57 days

## 2020-07-01 NOTE — Progress Notes (Signed)
Physical Therapy Treatment Patient Details Name: Alan Everett MRN: [id number] DOB: 1956/02/16 Today's Date: 07/01/2020    History of Present Illness Patient is a 64 y/o male with PMH ETOH use, tobacco abuse, admitted with R side weakness,sensory loss and speech deficits.  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    PT Comments    Treated pt in conjunction with OT to maximize safety and quality of session. Pt has shown improvements in understanding cues, but requires multimodal cues, increased time, and occasional re-wording of cues to perform tasks as directed. He demonstrated improved midline orientation and ability to correct his posture when able to look at self in mirror. Focused session on improving his balance and facilitating muscle activation to maintain midline orientation in upright postures. He required modAx2 to sit up R EOB with HOB elevated, modAx2 to come to stand with use of the stedy, and min-modAx2 to maintain standing balance for short bouts with improved independence when able to visually detect his deficit and correct himself. He also progressed from requiring his L hand on the bed rail to being able to grab the mattress more proximal to his body to maintain his static sitting balance in midline. Goals have been re-assessed and updated appropriately. Will continue to follow acutely. Current recommendations remain appropriate.    Follow Up Recommendations  SNF     Equipment Recommendations  Wheelchair (measurements PT);Wheelchair cushion (measurements PT);Hospital bed;3in1 (PT);Other (comment) (mechanical lift)    Recommendations for Other Services       Precautions / Restrictions Precautions Precautions: Fall Precaution Comments: G tube, right neglect, prevalon boot Required Braces or Orthoses: Splint/Cast Splint/Cast: soft pillow splint R UE at night Restrictions Weight Bearing Restrictions: No     Mobility  Bed Mobility Overal bed mobility: Needs Assistance Bed Mobility: Supine to Sit     Supine to sit: Mod assist;+2 for physical assistance;HOB elevated     General bed mobility comments: Cues provided to place L hand on R bed rail to pull trunk up to sit, with modAx2 to complete and square hips with EOB.   Transfers Overall transfer level: Needs assistance Equipment used: Ambulation equipment used Transfers: Sit to/from Stand Sit to Stand: Mod assist;+2 physical assistance;Min assist         General transfer comment: Mod A +2 to power up into standing from EOB with support at R elbow for weight bearing and weight shift and at R knee. Min A for power up from stedy seat. Continued support at R elbow and knee.   Ambulation/Gait             General Gait Details: Did not attempt this date   Stairs             Wheelchair Mobility    Modified Rankin (Stroke Patients Only) Modified Rankin (Stroke Patients Only) Pre-Morbid Rankin Score: No symptoms Modified Rankin: Severe disability     Balance Overall balance assessment: Needs assistance Sitting-balance support: Single extremity supported;Feet supported Sitting balance-Leahy Scale: Poor Sitting balance - Comments: Progressed from L hand on end of bed rail to L hand grasping edge of mattress proximal to body while maintaining midline posture with intermittent cues to find midline, min guard assist. Sitting in stedy reaching off BOS anteriorly with L hand at sink with mirror anterior to pt for visual cues and R hand placed by therapist on bar of stedy. demonstrated increased R lean when not looking in mirror and not  having L hand on surface. Postural control: Right lateral lean;Posterior lean Standing balance support: Bilateral upper extremity supported Standing balance-Leahy Scale: Poor Standing balance comment: Bilat UE placed on bar of stedy with tactile cues to extend knee and hips, min-modAx2 to maintain  his balance x2 bouts of ~30 sec each.                             Cognition Arousal/Alertness: Awake/alert Behavior During Therapy: WFL for tasks assessed/performed Overall Cognitive Status: Impaired/Different from baseline Area of Impairment: Safety/judgement;Awareness;Problem solving;Following commands;Memory;Attention                   Current Attention Level: Sustained Memory: Decreased short-term memory Following Commands: Follows one step commands inconsistently;Follows one step commands with increased time Safety/Judgement: Decreased awareness of safety;Decreased awareness of deficits Awareness: Emergent Problem Solving: Slow processing;Requires verbal cues;Requires tactile cues;Difficulty sequencing;Decreased initiation General Comments: Tactile, verbal, and visual cues with use of mirror provided to pt during session and repeated as needed. Slow processing with continued necglect of R side, requiring cues to remain attentive to R.       Exercises General Exercises - Upper Extremity Shoulder Flexion: Both;5 reps;Supine;PROM Elbow Flexion: PROM;5 reps;Right;Supine Elbow Extension: PROM;Right;Supine;5 reps Wrist Flexion: PROM;Right;10 reps;Supine Wrist Extension: PROM;Right;10 reps;Supine Digit Composite Flexion: PROM;5 reps;Right;Supine Composite Extension: PROM;5 reps;Right;Supine Other Exercises Other Exercises: Passive stretch to R knee into extension 2x ~30 sec prior to standing and x2 for ~30 seconds at end of session with leg positioned straight in recliner    General Comments General comments (skin integrity, edema, etc.): VSS      Pertinent Vitals/Pain Pain Assessment: Faces Faces Pain Scale: Hurts little more Pain Location: R hip and knee with extension Pain Descriptors / Indicators: Discomfort;Grimacing;Guarding;Moaning Pain Intervention(s): Limited activity within patient's tolerance;Monitored during session;Repositioned    Home Living                       Prior Function            PT Goals (current goals can now be found in the care plan section) Acute Rehab PT Goals Patient Stated Goal: to get better PT Goal Formulation: Patient unable to participate in goal setting Time For Goal Achievement: 07/16/20 Potential to Achieve Goals: Fair Progress towards PT goals: Progressing toward goals    Frequency    Min 3X/week      PT Plan Current plan remains appropriate    Co-evaluation PT/OT/SLP Co-Evaluation/Treatment: Yes Reason for Co-Treatment: Complexity of the patient's impairments (multi-system involvement);Necessary to address cognition/behavior during functional activity;For patient/therapist safety;To address functional/ADL transfers PT goals addressed during session: Mobility/safety with mobility;Balance;Proper use of DME OT goals addressed during session: ADL's and self-care      AM-PAC PT "6 Clicks" Mobility   Outcome Measure  Help needed turning from your back to your side while in a flat bed without using bedrails?: A Lot Help needed moving from lying on your back to sitting on the side of a flat bed without using bedrails?: A Lot Help needed moving to and from a bed to a chair (including a wheelchair)?: Total Help needed standing up from a chair using your arms (e.g., wheelchair or bedside chair)?: A Lot Help needed to walk in hospital room?: Total Help needed climbing 3-5 steps with a railing? : Total 6 Click Score: 9    End of Session Equipment Utilized During Treatment: Gait belt Activity Tolerance:  Patient tolerated treatment well Patient left: in chair;with call bell/phone within reach;with chair alarm set Nurse Communication: Mobility status PT Visit Diagnosis: Unsteadiness on feet (R26.81);Muscle weakness (generalized) (M62.81);Difficulty in walking, not elsewhere classified (R26.2);Other symptoms and signs involving the nervous system (R29.898);Hemiplegia and  hemiparesis Hemiplegia - Right/Left: Right Hemiplegia - dominant/non-dominant: Dominant Hemiplegia - caused by: Nontraumatic intracerebral hemorrhage     Time: 7253-6644 PT Time Calculation (min) (ACUTE ONLY): 27 min  Charges:  $Neuromuscular Re-education: 8-22 mins                     Raymond Gurney, PT, DPT Acute Rehabilitation Services  Pager: [phone] Office: [phone]    Jewel Baize 07/01/2020, 12:23 PM

## 2020-07-01 NOTE — Progress Notes (Signed)
Occupational Therapy Treatment Patient Details Name: Alan Everett MRN: [id number] DOB: 12-Jul-1956 Today's Date: 07/01/2020    History of present illness Patient is a 64 y/o male with PMH ETOH use, tobacco abuse, admitted with R side weakness,sensory loss and speech deficits.  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   OT comments  Pt progressing towards established OT goals and presents with high motivation to participate in therapy. Pt performing bed mobility with Min A and sitting at [employer] A demonstrating increased sitting balance and trunk control. Pt participating in lateral lean exercises at EOB with Min A for R lateral lean. Pt performing sit<>stand with stedy and Mod A +2; providing support at R elbow for weight bearing. Pt performing grooming at sink with Mod A for balance and weight bearing at RUE. Pt continues to requiring max cues and increased time for problem solving and following commands. Continue to recommend dc to CIR for intensive OT and will continue to follow acutely as admitted .    Follow Up Recommendations  CIR;Supervision/Assistance - 24 hour    Equipment Recommendations  Wheelchair (measurements OT);Wheelchair cushion (measurements OT);Hospital bed;Other (comment) (Lift)    Recommendations for Other Services Other (comment)    Precautions / Restrictions Precautions Precautions: Fall Precaution Comments: G tube, right neglect, prevalon boot Required Braces or Orthoses: Splint/Cast Splint/Cast: soft pillow splint R UE at night Restrictions Weight Bearing Restrictions: No       Mobility Bed Mobility Overal bed mobility: Needs Assistance Bed Mobility: Supine to Sit     Supine to sit: Mod assist;+2 for physical assistance;HOB elevated     General bed mobility comments: Cues provided to place L hand on R bed rail to pull trunk up to sit, with modAx2 to complete and square hips with EOB.    Transfers Overall transfer level: Needs assistance Equipment used: Ambulation equipment used Transfers: Sit to/from Stand Sit to Stand: Mod assist;+2 physical assistance;Min assist         General transfer comment: Mod A +2 to power up into standing from EOB with support at R elbow for weight bearing and weight shift and at R knee. Min A for power up from stedy seat. Continued support at R elbow and knee.     Balance Overall balance assessment: Needs assistance Sitting-balance support: Single extremity supported;Feet supported Sitting balance-Leahy Scale: Poor Sitting balance - Comments: Progressed from L hand on end of bed rail to L hand grasping edge of mattress proximal to body while maintaining midline posture with intermittent cues to find midline, min guard assist. Sitting in stedy reaching off BOS anteriorly with L hand at sink with mirror anterior to pt for visual cues and R hand placed by therapist on bar of stedy. demonstrated increased R lean when not looking in mirror and not having L hand on surface. Postural control: Right lateral lean;Posterior lean Standing balance support: Bilateral upper extremity supported Standing balance-Leahy Scale: Poor Standing balance comment: Bilat UE placed on bar of stedy with tactile cues to extend knee and hips, min-modAx2 to maintain his balance x2 bouts of ~30 sec each.                            ADL either performed or assessed with clinical judgement   ADL Overall ADL's : Needs assistance/impaired     Grooming: Wash/dry face;Minimal assistance;Moderate assistance;Sitting Grooming Details (indicate cue type and reason): Pt  washing his face at sink with Min-Mod A for sitting balance. Requiring Mod A for support at R elbow with forward weight shift to turn on water and grasp wash clothe. Pt able to correct posture with Max cues.                              Functional mobility during ADLs: Moderate assistance;+2  for physical assistance;+2 for safety/equipment (sit<>stand with stedy) General ADL Comments: Focused session on sittingb alance, trunk control, cognition, and weight bearing through RUE. Pt performing grooming task at sink while sititng at stedy. Pt also with decreased activity toelrance and fatigues quickly'     Vision   Vision Assessment?: Yes Additional Comments: Poor attention to R side. Able to track to left and right.    Perception     Praxis      Cognition Arousal/Alertness: Awake/alert Behavior During Therapy: WFL for tasks assessed/performed Overall Cognitive Status: Impaired/Different from baseline Area of Impairment: Safety/judgement;Awareness;Problem solving;Following commands;Memory;Attention                   Current Attention Level: Sustained Memory: Decreased short-term memory Following Commands: Follows one step commands inconsistently;Follows one step commands with increased time Safety/Judgement: Decreased awareness of safety;Decreased awareness of deficits Awareness: Emergent Problem Solving: Slow processing;Requires verbal cues;Requires tactile cues;Difficulty sequencing;Decreased initiation General Comments: Tactile, verbal, and visual cues with use of mirror provided to pt during session and repeated as needed. Slow processing with continued necglect of R side, requiring cues to remain attentive to R.         Exercises Exercises: Other exercises Other Exercises Other Exercises: Passive stretch to R knee into extension 2x ~30 sec prior to standing and x2 for ~30 seconds at end of session with leg positioned straight in recliner Other Exercises: Lateral leaning into L elbow and then pushing back into upright posture; x5. Repeat on R side with Min A for initating changing in position   Shoulder Instructions       General Comments VSS    Pertinent Vitals/ Pain       Pain Assessment: Faces Faces Pain Scale: Hurts little more Pain Location: R hip  and knee with extension Pain Descriptors / Indicators: Discomfort;Grimacing;Guarding;Moaning Pain Intervention(s): Monitored during session;Limited activity within patient's tolerance;Repositioned  Home Living                                          Prior Functioning/Environment              Frequency  Min 2X/week        Progress Toward Goals  OT Goals(current goals can now be found in the care plan section)  Progress towards OT goals: Progressing toward goals  Acute Rehab OT Goals Patient Stated Goal: to get better OT Goal Formulation: Patient unable to participate in goal setting Time For Goal Achievement: 07/09/20 Potential to Achieve Goals: Good ADL Goals Pt Will Perform Grooming: sitting;with min guard assist Pt Will Transfer to Toilet: with max assist;with transfer board;bedside commode Pt/caregiver will Perform Home Exercise Program: Right Upper extremity;With written HEP provided Additional ADL Goal #1: pt will look at object in midline 50% of session Additional ADL Goal #2: Pt will be able to track right and left 5 times in succesion with increased time Additional ADL Goal #3: Pt will follow 2  step command  3 out of 5 one step commands Additional ADL Goal #4: pt will sit EOB min guard (A) as prep for transfers  Plan Discharge plan remains appropriate;Frequency remains appropriate    Co-evaluation    PT/OT/SLP Co-Evaluation/Treatment: Yes Reason for Co-Treatment: Complexity of the patient's impairments (multi-system involvement);For patient/therapist safety;To address functional/ADL transfers PT goals addressed during session: Mobility/safety with mobility;Balance;Proper use of DME OT goals addressed during session: ADL's and self-care      AM-PAC OT "6 Clicks" Daily Activity     Outcome Measure   Help from another person eating meals?: Total (NPO) Help from another person taking care of personal grooming?: A Lot Help from another  person toileting, which includes using toliet, bedpan, or urinal?: Total Help from another person bathing (including washing, rinsing, drying)?: Total Help from another person to put on and taking off regular upper body clothing?: Total Help from another person to put on and taking off regular lower body clothing?: Total 6 Click Score: 7    End of Session Equipment Utilized During Treatment: Gait belt;Other (comment) (stedy)  OT Visit Diagnosis: Unsteadiness on feet (R26.81);Muscle weakness (generalized) (M62.81) Hemiplegia - Right/Left: Right Hemiplegia - dominant/non-dominant: Dominant   Activity Tolerance Patient tolerated treatment well   Patient Left in chair;with call bell/phone within reach;with chair alarm set   Nurse Communication Mobility status        Time: 7829-5621 OT Time Calculation (min): 31 min  Charges: OT General Charges $OT Visit: 1 Visit OT Treatments $Self Care/Home Management : 8-22 mins  Tysean Vandervliet MSOT, OTR/L Acute Rehab Pager: [phone] Office: [phone]   Theodoro Grist Cailen Mihalik 07/01/2020, 2:00 PM

## 2020-07-01 NOTE — Plan of Care (Signed)

## 2020-07-01 NOTE — Progress Notes (Signed)
  Speech Language Pathology Treatment: Cognitive-Linquistic  Patient Details Name: Alan Everett MRN: [id number] DOB: 15-Dec-1955 Today's Date: 07/01/2020 Time: 7622-6333 SLP Time Calculation (min) (ACUTE ONLY): 16 min  Assessment / Plan / Recommendation Clinical Impression  Targeted functional communication with and without visual cues. Pt able unable to participate in automatic speech tasks (months) with max verbal cueing, could not repeat the month. When looking at a magazine with photos of a christmas tree and cookies, pt able to repeat [employer] Christmas after multiple repetitions and say "cookies" when looking a picture. Needed hand over hand assist to guide hand and visual attention to right side of magazine and pictures to point to/discriminate three shapes (circle, triangle, star). Pt with 25% accuracy over 3 trials without assist. Pt was able to intelligibly ask several fluent self directed questions/verbalize statements with correct grammatical structure, "did you see who got my boots?" "he came yesterday but he didn't come today" though they did not really make sense.   HPI HPI: Alan Everett is a 64 y.o. male with a PMHx of alcohol use and tobacco abuse who presents acutely to the ED via EMS as a Code Stroke for acute onset of right sided weakness. Patient with disoriented, right leg weakness, right limb ataxia, right decreased sensation and Global aphasia on exam. 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 Worsening of intraventricular extension.  MBS 10/19 mod-severe dysphagia, continue NPO. PEG 10/22.  Repeat MBS 10/30 with improvements. Started dys1/nectar 11/2.       SLP Plan  Continue with current plan of care       Recommendations                   Oral Care Recommendations: Oral care BID Follow up Recommendations: Skilled Nursing facility;Inpatient Rehab SLP Visit Diagnosis: Aphasia (R47.01) Plan: Continue with current plan of care       GO                 Alan Everett, Riley Nearing 07/01/2020, 12:43 PM

## 2020-07-02 LAB — CBC WITH DIFFERENTIAL/PLATELET
Abs Immature Granulocytes: 0.05 10*3/uL (ref 0.00–0.07)
Basophils Absolute: 0.1 10*3/uL (ref 0.0–0.1)
Basophils Relative: 1 %
Eosinophils Absolute: 0.2 10*3/uL (ref 0.0–0.5)
Eosinophils Relative: 2 %
HCT: 30.4 % — ABNORMAL LOW (ref 39.0–52.0)
Hemoglobin: 9.4 g/dL — ABNORMAL LOW (ref 13.0–17.0)
Immature Granulocytes: 1 %
Lymphocytes Relative: 14 %
Lymphs Abs: 1.3 10*3/uL (ref 0.7–4.0)
MCH: 28.8 pg (ref 26.0–34.0)
MCHC: 30.9 g/dL (ref 30.0–36.0)
MCV: 93.3 fL (ref 80.0–100.0)
Monocytes Absolute: 1.4 10*3/uL — ABNORMAL HIGH (ref 0.1–1.0)
Monocytes Relative: 15 %
Neutro Abs: 6.5 10*3/uL (ref 1.7–7.7)
Neutrophils Relative %: 67 %
Platelets: 409 10*3/uL — ABNORMAL HIGH (ref 150–400)
RBC: 3.26 MIL/uL — ABNORMAL LOW (ref 4.22–5.81)
RDW: 15.9 % — ABNORMAL HIGH (ref 11.5–15.5)
WBC: 9.5 10*3/uL (ref 4.0–10.5)
nRBC: 0 % (ref 0.0–0.2)

## 2020-07-02 LAB — BASIC METABOLIC PANEL
Anion gap: 12 (ref 5–15)
BUN: 15 mg/dL (ref 8–23)
CO2: 27 mmol/L (ref 22–32)
Calcium: 9.7 mg/dL (ref 8.9–10.3)
Chloride: 99 mmol/L (ref 98–111)
Creatinine, Ser: 0.58 mg/dL — ABNORMAL LOW (ref 0.61–1.24)
GFR, Estimated: >60 mL/min (ref >60)
Glucose, Bld: 115 mg/dL — ABNORMAL HIGH (ref 70–99)
Potassium: 3.7 mmol/L (ref 3.5–5.1)
Sodium: 138 mmol/L (ref 135–145)

## 2020-07-02 LAB — GLUCOSE, CAPILLARY
Glucose-Capillary: 115 mg/dL — ABNORMAL HIGH (ref 70–99)
Glucose-Capillary: 119 mg/dL — ABNORMAL HIGH (ref 70–99)
Glucose-Capillary: 126 mg/dL — ABNORMAL HIGH (ref 70–99)
Glucose-Capillary: 129 mg/dL — ABNORMAL HIGH (ref 70–99)
Glucose-Capillary: 130 mg/dL — ABNORMAL HIGH (ref 70–99)
Glucose-Capillary: 132 mg/dL — ABNORMAL HIGH (ref 70–99)
Glucose-Capillary: 91 mg/dL (ref 70–99)

## 2020-07-02 NOTE — TOC Progression Note (Signed)
Transition of Care [facility]) - Progression Note    Patient Details  Name: Alan Everett MRN: [id number] Date of Birth: 07/15/1956  Transition of Care [facility]) CM/SW Contact  Kermit Balo, RN Phone Number: 07/02/2020, 2:23 PM  Clinical Narrative:    Pt continues to have medicaid pending and no bed offer.  TOC following.  Expected Discharge Plan: Skilled Nursing Facility Barriers to Discharge: Continued Medical Work up, SNF Pending payor source - [facility], SNF Pending bed offer, SNF Pending Medicaid  Expected Discharge Plan and Services Expected Discharge Plan: Skilled Nursing Facility     Post Acute Care Choice: Skilled Nursing Facility Living arrangements for the past 2 months: Single Family Home                                       Social Determinants of Health (SDOH) Interventions    Readmission Risk Interventions No flowsheet data found.

## 2020-07-02 NOTE — Progress Notes (Signed)
[facility] PROGRESS NOTE  Alberto Pina UEA:[id number] DOB: 12/05/1955 DOA: 05/05/2020 PCP: Patient, No Pcp Per    11/16   Status:  Remains inpatient appropriate because:Altered mental status and Unsafe d/c plan  Dispo:  The patient is from: Home              Anticipated d/c is to: SNF vs home with family and home health services              Anticipated d/c date is: > 3 days              Patient currently is medically stable to d/c.  No expectation of full recovery from neurological injury with recommendation for long-term care at skilled nursing facility.     Code Status: Full Family Communication: Patient only-no family at bedside DVT prophylaxis: SCDs only-presented with intraventricular hemorrhage resulting in neurological injury Vaccination status: We will need to discuss with family if patient has previously received Covid vaccination  Foley catheter: No  HPI: 64 year old male patient with history of iron deficiency anemia, alcohol use, cocaine use and tobacco abuse.  Patient presented to the ER by EMS on 10/6 as a code stroke.  At that time he was noted to have acute onset right-sided weakness, sensory loss and aphasia.  CT of the head revealed an enlarging left thalamic capsular intraparenchymal hemorrhage with increasing intraventricular hemorrhage from left ventricle into the third and fourth ventricles.  Neurosurgery consulted but no indication at that time for EVD.  Urine drug screen was found to be positive for cocaine.  Since admission hospital course has been complicated by pneumonia and recurrent blood loss anemia.  It has been noted that in the outpatient setting he had undergone EGD and colonoscopy on 8/2 at [facility] in [place].  No significant abnormalities identified and patient was recommended to continue previous iron.  As of 11/30 communication in regards to global aphasia is improving.  He also has moderate to severe dysphagia and is requiring  feedings via a PEG tube.  Subjective: Awake.  Restless.  Had hand over genitalia and I asked him if he needed to urinate and he stated no but then later was incontinent of urine.  Still having some issues with expressive aphasia today and appropriate communication.  Objective: Vitals:   07/02/20 0427 07/02/20 0802  BP: (!) 119/58 133/64  Pulse: 97 (!) 107  Resp: 18 18  Temp: 97.9 F (36.6 C) 98.1 F (36.7 C)  SpO2: 100% 98%    Intake/Output Summary (Last 24 hours) at 07/02/2020 1103 Last data filed at 07/01/2020 1753 Gross per 24 hour  Intake --  Output 1000 ml  Net -1000 ml   Filed Weights   06/15/20 0407 06/17/20 0500 06/18/20 0453  Weight: 59.7 kg 59.3 kg 60 kg    Exam:  Constitutional: NAD, calm, uncomfortable secondary to malpositioning in bed.  Staff notified of need to readjust patient. Respiratory: Lungs clear to auscultation bilaterally, RA Cardiovascular: Regular rate and rhythm at rest, adequate capillary refill, no peripheral edema. Abdomen: no tenderness, Bowel sounds positive.  PEG tube tube for feeding.  Had been eating 50 to 75% of meals over the past 48 hours has only eaten 15 to 25% of meals. Musculoskeletal: RUE neglect/intermittent issues with persistent flexion of RUE Neurologic: CN 2-12 appears to be grossly intact except for notable global aphasia with waxing and waning quality. Sensation intact, Strength 4/5 left side-right side with neglect and paralysis.   Psychiatric: Typically he is  oriented x name and place.  Due to patient's difficulty with verbal communication full psychiatric evaluation in regards to affect and orientation remains difficult   Assessment/Plan: Acute problems: Acute Hemorrhagic stroke/cerebral edema -Etiology 2/2 severe hypertension, alcohol and cocaine use. -Neurosurgery was consulted no indications for surgery present -Follow-up CT head stable.  -As of 11/30 improvements regarding global aphasia, still having some  difficulty following commands but speech otherwise significantly improved.  As of 12/2 once again having difficulty with global aphasia.  This is not unexpected noting stroke patients tend to have good days and bad days. -Not on antiplatelet tx secondary to hemorrhagic etiology. LDL 80. -Continue PT and OT.  Noted with improved static and dynamic sitting balance.  Moderate to severe dysphagia 2/2 stroke/severe protein calorie malnutrition -Secondary to stroke. PEG tube placed on 05/21/2020. Nutrition Problem: Severe Malnutrition Etiology: social / environmental circumstances Signs/Symptoms: severe fat depletion, severe muscle depletion Interventions: Tube feeding currently tolerating mechanical soft diet with thin liquids intake.  Also on nocturnal tube feedings.  Oral intake decreasing again.  Patient has previously expressed dislike of foods offered. Estimated body mass index is 21.35 kg/m as calculated from the following:   Height as of 04/09/20: 5\' 6"  (1.676 m).   Weight as of this encounter: 60 kg.  LBM 11/30  Acute on chronic anemia -Total of 6 units of PRBC transfusions given in this admission current hemoglobin stable between 8 and 9. -GI consulted. -s/p panendoscopy 03/01/2020 as an OP:  Colonoscopy-sigmoid diverticulosis EGD-normal esophagus, gastritis, normal duodenum and no other abnormal findings -Continue PPI twice daily. (See below regarding history of gastritis on recent EGD)  Stage II buttock decubitus Wound / Incision (Open or Dehisced) 05/21/20 Puncture Abdomen Left;Anterior;Upper G-tube insertion site  (Active)  Date First Assessed/Time First Assessed: 05/21/20 1533   Wound Type: Puncture  Location: Abdomen  Location Orientation: Left;Anterior;Upper  Wound Description (Comments): G-tube insertion site   Present on Admission: No    Assessments 05/21/2020  3:34 PM 06/27/2020  8:10 AM  Dressing Type Gauze (Comment);Tape dressing Gauze (Comment)  Dressing Changed New New   Dressing Status Clean;Dry;Intact Dry;Intact  Dressing Change Frequency PRN PRN  Site / Wound Assessment Clean;Dry Dry;Clean  Peri-wound Assessment Intact Intact  Closure None --  Drainage Amount None --  Treatment Cleansed --     No Linked orders to display     Pressure Injury 06/02/20 Buttocks Right Stage 2 -  Partial thickness loss of dermis presenting as a shallow open injury with a red, pink wound bed without slough. 7.5 cm in lenth by 4.5 width  (Active)  Date First Assessed/Time First Assessed: 06/02/20 0800   Location: Buttocks  Location Orientation: Right  Staging: Stage 2 -  Partial thickness loss of dermis presenting as a shallow open injury with a red, pink wound bed without slough.  Wound Descri...    Assessments 06/02/2020  8:00 AM 06/30/2020  8:01 PM  Dressing Type -- Foam - Lift dressing to assess site every shift  Dressing -- Intact  Dressing Change Frequency -- Every 3 days  State of Healing -- Epithelialized  Site / Wound Assessment -- Dry  Wound Length (cm) 7.5 cm --  Wound Width (cm) 4.5 cm --  Wound Surface Area (cm^2) 33.75 cm^2 --  Drainage Amount None --  Treatment Other (Comment) --     No Linked orders to display      Other problems: Goals of care -Poor neurological recovery so far.  Do not expect he  will return to baseline level of functioning and likely will require long-term skilled nursing care. -Palliative care was consulted in the past. Patient remains full code.   Bibasilar pneumonia -resolved -CT scan 10/12 showed bibasilar pneumonia.  -Treated with broad-spectrum antibiotics.  -WBC normalized. No longer having fever.  Klebsiella UTI  -resolved -Urine culture report from 10/10 with more than 1000 CFU per mL of Klebsiella pneumoniae.  -Completed 5-day course of IV Rocephin.   Sinus tachycardia Essential hypertension/grade 1 diastolic dysfunction grade 1 diastolic dysfunction -Heart rate and blood pressure both are stable on Coreg  3.125 mg daily. -Echocardiogram this admission revealed preserved LVEF with physiologic findings consistent with diastolic dysfunction  Hypernatremia  - resolved   Elevated liver enzymes -Right upper quadrant ultrasound-cholelithiasis without acute cholecystitis -Chronic mild elevation of liver enzymes persist.  Tobacco Abuse -Cessation counseling this admission deferred in the context of abnormal neurological status and patient unable to participate  Cocaine abuse -Cessation counseling this admission deferred in the context of abnormal neurological status and patient unable to participate  GERD/recent gastritis on endoscopy August 2021 -PPI twice daily  Data Reviewed: Basic Metabolic Panel: Recent Labs  Lab 07/02/20 0806  NA 138  K 3.7  CL 99  CO2 27  GLUCOSE 115*  BUN 15  CREATININE 0.58*  CALCIUM 9.7   Liver Function Tests: No results for input(s): AST, ALT, ALKPHOS, BILITOT, PROT, ALBUMIN in the last 168 hours. No results for input(s): LIPASE, AMYLASE in the last 168 hours. No results for input(s): AMMONIA in the last 168 hours. CBC: Recent Labs  Lab 07/02/20 0806  WBC 9.5  NEUTROABS 6.5  HGB 9.4*  HCT 30.4*  MCV 93.3  PLT 409*   Cardiac Enzymes: No results for input(s): CKTOTAL, CKMB, CKMBINDEX, TROPONINI in the last 168 hours. BNP (last 3 results) Recent Labs    05/29/20 0409  BNP 23.2    ProBNP (last 3 results) No results for input(s): PROBNP in the last 8760 hours.  CBG: Recent Labs  Lab 07/01/20 1604 07/01/20 1940 07/02/20 0022 07/02/20 0432 07/02/20 0922  GLUCAP 113* 111* 115* 130* 129*    No results found for this or any previous visit (from the past 240 hour(s)).   Studies: No results found.  Scheduled Meds: . carvedilol  6.25 mg Per Tube BID WC  . chlorhexidine  15 mL Mouth Rinse BID  . feeding supplement  237 mL Oral TID BM  . feeding supplement (OSMOLITE 1.5 CAL)  840 mL Per Tube Q24H  . feeding supplement (PROSource  TF)  45 mL Per Tube TID  . free water  200 mL Per Tube Q4H  . insulin aspart  0-9 Units Subcutaneous Q4H  . insulin glargine  5 Units Subcutaneous Daily  . mouth rinse  15 mL Mouth Rinse q12n4p  . nutrition supplement (JUVEN)  1 packet Per Tube BID BM  . pantoprazole sodium  40 mg Per Tube BID  . sennosides  5 mL Per Tube BID  . sodium chloride flush  10-40 mL Intracatheter Q12H   Continuous Infusions:  Principal Problem:   ICH (intracerebral hemorrhage) (HCC) Active Problems:   Polysubstance abuse (HCC)   Dysphagia due to recent cerebrovascular accident (CVA)   Hyperglycemia   Hypokalemia   Hypomagnesemia   Fever   SOB (shortness of breath)   Leukocytosis   Aspiration pneumonia of both lower lobes (HCC)   Palliative care by specialist   Goals of care, counseling/discussion   DNR (do not resuscitate) discussion  Protein-calorie malnutrition, severe   Heme positive stool   Acute lower UTI   Acute blood loss anemia   Pressure injury of skin   Consultants:  Neurology  Gastroenterology  Procedures:  Echocardiogram  Core track trach tube  PEG tube  Antibiotics: Anti-infectives (From admission, onward)   Start     Dose/Rate Route Frequency Ordered Stop   05/28/20 0900  cefTRIAXone (ROCEPHIN) 1 g in sodium chloride 0.9 % 100 mL IVPB        1 g 200 mL/hr over 30 Minutes Intravenous Every 24 hours 05/28/20 0807 06/01/20 0855   05/21/20 1515  ceFAZolin (ANCEF) IVPB 2g/100 mL premix        2 g 200 mL/hr over 30 Minutes Intravenous To Radiology 05/21/20 1429 05/21/20 2100   05/14/20 1000  cefTRIAXone (ROCEPHIN) 2 g in sodium chloride 0.9 % 100 mL IVPB        2 g 200 mL/hr over 30 Minutes Intravenous Daily 05/13/20 1454 05/15/20 1255   05/13/20 2200  metroNIDAZOLE (FLAGYL) tablet 500 mg        500 mg Per Tube Every 8 hours 05/13/20 1847 05/15/20 2219   05/13/20 1400  metroNIDAZOLE (FLAGYL) tablet 500 mg  Status:  Discontinued        500 mg Oral Every 8 hours  05/13/20 0935 05/13/20 1847   05/12/20 0900  cefTRIAXone (ROCEPHIN) 1 g in sodium chloride 0.9 % 100 mL IVPB  Status:  Discontinued        1 g 200 mL/hr over 30 Minutes Intravenous Daily 05/12/20 0817 05/13/20 1454   05/12/20 0900  azithromycin (ZITHROMAX) 500 mg in sodium chloride 0.9 % 250 mL IVPB  Status:  Discontinued        500 mg 250 mL/hr over 60 Minutes Intravenous Daily 05/12/20 0817 05/13/20 0935   05/09/20 0930  Ampicillin-Sulbactam (UNASYN) 3 g in sodium chloride 0.9 % 100 mL IVPB  Status:  Discontinued        3 g 200 mL/hr over 30 Minutes Intravenous Every 8 hours 05/09/20 0837 05/09/20 0839   05/09/20 0930  Ampicillin-Sulbactam (UNASYN) 3 g in sodium chloride 0.9 % 100 mL IVPB  Status:  Discontinued        3 g 200 mL/hr over 30 Minutes Intravenous Every 6 hours 05/09/20 0839 05/12/20 0817       Time spent: 20 minutes    Junious Silk ANP  [facility] Pager [phone].  07/02/2020, 11:03 AM  LOS: 58 days

## 2020-07-03 LAB — GLUCOSE, CAPILLARY
Glucose-Capillary: 135 mg/dL — ABNORMAL HIGH (ref 70–99)
Glucose-Capillary: 105 mg/dL — ABNORMAL HIGH (ref 70–99)
Glucose-Capillary: 129 mg/dL — ABNORMAL HIGH (ref 70–99)
Glucose-Capillary: 113 mg/dL — ABNORMAL HIGH (ref 70–99)
Glucose-Capillary: 121 mg/dL — ABNORMAL HIGH (ref 70–99)
Glucose-Capillary: 123 mg/dL — ABNORMAL HIGH (ref 70–99)

## 2020-07-03 NOTE — Progress Notes (Signed)
PROGRESS NOTE    Alan Everett   AST:[id number]  DOB: 1962-08-09  DOA: 05/05/2020     59  PCP: Patient, No Pcp Per  CC: weakness  Hospital Course: 64 y.o. male with a PMHx of alcohol use and tobacco abuse. Patient presented to the ED via EMS on 10/6 as a Code Stroke for acute onset of right sided weakness, sensory loss and aphasia. CT head revealed an enlarging L thalamocapsular IPH w/ increasing IVH from L ventricle into 3rd and 4th ventricles. Neurosurgery was initially consulted for EVD consideration which was then deferred.   UDS positive for cocaine.    Admitted for stroke work-up. Hospital course complicated by pneumonia and recurrent blood loss anemia. Pending placement at this time.   Interval History:  The last few days he has been more restless in bed and almost sitting perpendicular curled up in the bed. I have now known him several weeks and he is not that much better if any compared to about 3 weeks ago. He still mumbles and every now and then will say a word that makes sense in context. He cannot follow any commands with just verbal instructions but he will mimic hand movements and do them with his left side. His right side remains completely paralyzed.  Old records reviewed in assessment of this patient  ROS: Review of systems not obtained due to patient factors. Severe cognitive impairment   Assessment & Plan: Acute Hemorrhagic stroke/cerebral edema -Likely in the setting of severe hypertension, alcohol and cocaine use. -Because of IVH, neurosurgery was consulted. EVD was deferred.   -Subsequent CT scans of the head showed stable findings.   -He is alert and awake but has global aphasia, unable to follow commands verbally (mimics movements). No change in neurological status in about 64 weeks now that I've known him -Not on antiplatelets.  LDL 80. -Barrier to discharge has been lack of insurance as well as use of cocaine on admission (however given his poor  neurologic status, he would not be able to further abuse drugs going forward anyways)  Moderate to severe dysphagia -Secondary to stroke.  PEG tube placed on 05/21/2020. -Aspiration precautions - continue dysphagia diet and TF  Bibasilar pneumonia -resolved -CT scan 10/12 showed bibasilar pneumonia.   -Treated with broad-spectrum antibiotics.   -WBC normalized.  No longer having fever.  Klebsiella UTI  -resolved -Urine culture report from 10/10 with more than 1000 CFU per mL of Klebsiella pneumoniae.   -Completed 5-day course of IV Rocephin.   Acute on chronic anemia -Total of 6 units of PRBC transfusions given in this admission. -Hemoglobin stable 8-9 g/dL -GI consult appreciated.  Currently endoscopic evaluation is on hold. -Continue PPI twice daily.  Sinus tachycardia Essential hypertension -Heart rate and blood pressure both are stable on Coreg 3.125 mg daily.  Hypernatremia - resolved   Elevated liver enzymes -Right upper quadrant ultrasound-cholelithiasis without acute cholecystitis -Chronic mild elevation of liver enzymes persist.  Tobacco Abuse -Counseled to quit using this -Given current neurologic status, would be unable to continue any further substance abuse at this point  Cocaine abuse -Counseled to quit using this -Given current neurologic status, would be unable to continue any further substance abuse at this point  GERD -PPI twice daily  Goals of care -Poor neurological recovery so far.  Poor expectation of further neurological recovery -Palliative care was consulted in the past.  Patient remains full code.    Antimicrobials: None further   DVT prophylaxis: SCD Code Status:  Full Family Communication: none present Disposition Plan: Status is: Inpatient  Remains inpatient appropriate because:Unsafe d/c plan and no insurance and placement options limited; awaiting placement   Dispo: The patient is from: Home              Anticipated d/c  is to: SNF              Anticipated d/c date is: > 3 days. Placement difficult due to lack of insurance and cocaine positive on admission although he will not be able to continue with substance abuse anyways given severe neurologic deficits.  Other barrier appears to be lack of insurance              Patient currently is medically stable to d/c.   Objective: Blood pressure (!) 94/58, pulse 90, temperature 97.9 F (36.6 C), temperature source Oral, resp. rate 16, weight 67.3 kg, SpO2 100 %.  Examination: General appearance: resting in bed comfortable; not following any commands this am Head: Normocephalic, without obvious abnormality, atraumatic Eyes: EOMI Lungs: clear to auscultation bilaterally Heart: regular rate and rhythm and S1, S2 normal Abdomen: soft, NT, ND, BS present; PEG in palce Extremities: no edema Skin: xerosis Neurologic: no movement with right side; can move left side fairly well  Consultants:   Neuro  GI  Procedures:   none  Data Reviewed: I have personally reviewed following labs and imaging studies Results for orders placed or performed during the hospital encounter of 05/05/20 (from the past 24 hour(s))  Glucose, capillary     Status: Abnormal   Collection Time: 07/02/20 12:44 PM  Result Value Ref Range   Glucose-Capillary 126 (H) 70 - 99 mg/dL  Glucose, capillary     Status: None   Collection Time: 07/02/20  4:16 PM  Result Value Ref Range   Glucose-Capillary 91 70 - 99 mg/dL  Glucose, capillary     Status: Abnormal   Collection Time: 07/02/20  8:01 PM  Result Value Ref Range   Glucose-Capillary 119 (H) 70 - 99 mg/dL  Glucose, capillary     Status: Abnormal   Collection Time: 07/02/20 11:51 PM  Result Value Ref Range   Glucose-Capillary 132 (H) 70 - 99 mg/dL  Glucose, capillary     Status: Abnormal   Collection Time: 07/03/20  4:05 AM  Result Value Ref Range   Glucose-Capillary 135 (H) 70 - 99 mg/dL  Glucose, capillary     Status: Abnormal    Collection Time: 07/03/20  8:06 AM  Result Value Ref Range   Glucose-Capillary 105 (H) 70 - 99 mg/dL    No results found for this or any previous visit (from the past 240 hour(s)).   Radiology Studies: No results found. DG Swallowing Func-Speech Pathology  Final Result    DG Abd Portable 1V  Final Result    DG Chest Port 1 View  Final Result    US Abdomen Limited RUQ (LIVER/GB)  Final Result    CT ABDOMEN PELVIS WO CONTRAST  Final Result    DG Chest Port 1 View  Final Result    IR GASTROSTOMY TUBE MOD SED  Final Result    DG Abd Portable 1V  Final Result    DG Swallowing Func-Speech Pathology  Final Result    CT HEAD W & WO CONTRAST  Final Result    DG CHEST PORT 1 VIEW  Final Result    CT ABDOMEN PELVIS W CONTRAST  Final Result    CT CHEST W CONTRAST  Final Result    DG CHEST PORT 1 VIEW  Final Result    DG CHEST PORT 1 VIEW  Final Result    DG CHEST PORT 1 VIEW  Final Result    CT HEAD WO CONTRAST  Final Result    DG CHEST PORT 1 VIEW  Final Result    CT ANGIO NECK W OR WO CONTRAST  Final Result    CT ANGIO HEAD W OR WO CONTRAST  Final Result    CT HEAD WO CONTRAST  Final Result    CT Head Wo Contrast  Final Result    DG Chest Port 1 View  Final Result    CT HEAD CODE STROKE WO CONTRAST  Final Result      Scheduled Meds: . carvedilol  6.25 mg Per Tube BID WC  . chlorhexidine  15 mL Mouth Rinse BID  . feeding supplement  237 mL Oral TID BM  . feeding supplement (OSMOLITE 1.5 CAL)  840 mL Per Tube Q24H  . feeding supplement (PROSource TF)  45 mL Per Tube TID  . free water  200 mL Per Tube Q4H  . insulin aspart  0-9 Units Subcutaneous Q4H  . insulin glargine  5 Units Subcutaneous Daily  . mouth rinse  15 mL Mouth Rinse q12n4p  . nutrition supplement (JUVEN)  1 packet Per Tube BID BM  . pantoprazole sodium  40 mg Per Tube BID  . sennosides  5 mL Per Tube BID  . sodium chloride flush  10-40 mL Intracatheter Q12H   PRN Meds:  acetaminophen **OR** acetaminophen (TYLENOL) oral liquid 160 mg/5 mL **OR** acetaminophen, levalbuterol, sodium chloride flush Continuous Infusions:   LOS: 59 days  Time spent: Greater than 50% of the 35 minute visit was spent in counseling/coordination of care for the patient as laid out in the A&P.   Lewie Chamber, MD [facility] 07/03/2020, 12:16 PM

## 2020-07-03 NOTE — Progress Notes (Signed)
Patient requiring constant, frequent repositioning in bed and redirection. He continues to attempt to get out of bed. Bed alarm on, mats on floor, low bed in low.

## 2020-07-03 NOTE — Progress Notes (Signed)
Pt has made multiple frequent attempts to get out of bed on his own today requiring frequent staff to bedside. Repositioned multiple times.No evidence of understanding when educated on risk of fall etc. PEG tube patent and all scheduled meds given without difficulty via the tube. 24 hour feed has been initiated. Several incontinent care hygiene provided by Nurse Jethro Poling throughout the shift. Pt using condom cath with good results however, at times he does remove it requiring replacement.

## 2020-07-04 LAB — GLUCOSE, CAPILLARY
Glucose-Capillary: 111 mg/dL — ABNORMAL HIGH (ref 70–99)
Glucose-Capillary: 112 mg/dL — ABNORMAL HIGH (ref 70–99)
Glucose-Capillary: 136 mg/dL — ABNORMAL HIGH (ref 70–99)
Glucose-Capillary: 137 mg/dL — ABNORMAL HIGH (ref 70–99)
Glucose-Capillary: 92 mg/dL (ref 70–99)
Glucose-Capillary: 95 mg/dL (ref 70–99)

## 2020-07-04 NOTE — Progress Notes (Signed)
PROGRESS NOTE    Alan Everett   PZW:[id number]  DOB: 02-18-1956  DOA: 05/05/2020     60  PCP: Patient, No Pcp Per  CC: weakness  Hospital Course: 64 y.o. male with a PMHx of alcohol use and tobacco abuse. Patient presented to the ED via EMS on 10/6 as a Code Stroke for acute onset of right sided weakness, sensory loss and aphasia. CT head revealed an enlarging L thalamocapsular IPH w/ increasing IVH from L ventricle into 3rd and 4th ventricles. Neurosurgery was initially consulted for EVD consideration which was then deferred.   UDS positive for cocaine.    Admitted for stroke work-up. Hospital course complicated by pneumonia and recurrent blood loss anemia. Pending placement at this time.   Interval History:  The last few days he has been more restless in bed and almost sitting perpendicular curled up in the bed. I have now known him several weeks and he is not that much better if any compared to about 3 weeks ago. He still mumbles and every now and then will say a word that makes sense in context. He cannot follow any commands with just verbal instructions but he will mimic hand movements and do them with his left side. His right side remains completely paralyzed.  ROS: Review of systems not obtained due to patient factors. Severe cognitive impairment   Assessment & Plan: Acute Hemorrhagic stroke/cerebral edema -Likely in the setting of severe hypertension, alcohol and cocaine use. -Because of IVH, neurosurgery was consulted. EVD was deferred.   -Subsequent CT scans of the head showed stable findings.   -He is alert and awake but has global aphasia, unable to follow commands verbally -Not on antiplatelets.  LDL 80. -Barrier to discharge has been lack of insurance as well as use of cocaine on admission  Moderate to severe dysphagia -Secondary to stroke.  PEG tube placed on 05/21/2020. -Aspiration precautions - continue dysphagia diet and TF  Bibasilar pneumonia -resolved  -CT scan 10/12 showed bibasilar pneumonia.   -Treated with broad-spectrum antibiotics.   -WBC normalized.  No longer having fever.  Klebsiella UTI  -resolved -Urine culture report from 10/10 with more than 1000 CFU per mL of Klebsiella pneumoniae.   -Completed 5-day course of IV Rocephin.   Acute on chronic anemia -Total of 6 units of PRBC transfusions given in this admission. -Hemoglobin stable 8-9 g/dL -GI consult appreciated.  Currently endoscopic evaluation is on hold. -Continue PPI twice daily.  Sinus tachycardia Essential hypertension -Heart rate and blood pressure both are stable on Coreg 3.125 mg daily.  Hypernatremia - resolved   Elevated liver enzymes -Right upper quadrant ultrasound-cholelithiasis without acute cholecystitis -Chronic mild elevation of liver enzymes persist.  Tobacco Abuse -Counseled to quit using this -Given current neurologic status, would be unable to continue any further substance abuse at this point  Cocaine abuse -Counseled to quit using this -Given current neurologic status, would be unable to continue any further substance abuse at this point  GERD -PPI twice daily  Goals of care -Poor neurological recovery so far.  Poor expectation of further neurological recovery -Palliative care was consulted in the past.  Patient remains full code.    Antimicrobials: None further   DVT prophylaxis: SCD Code Status: Full Family Communication: none present Disposition Plan: Status is: Inpatient  Remains inpatient appropriate because:Unsafe d/c plan and no insurance and placement options limited; awaiting placement   Dispo: The patient is from: Home  Anticipated d/c is to: SNF              Anticipated d/c date is: > 3 days. Placement difficult due to lack of insurance and cocaine positive on admission although he will not be able to continue with substance abuse anyways given severe neurologic deficits.  Other barrier appears  to be lack of insurance              Patient currently is medically stable to d/c.   Objective: Blood pressure 123/72, pulse 97, temperature 98 F (36.7 C), temperature source Oral, resp. rate 18, weight 46.3 kg, SpO2 100 %.   Examination: General appearance: resting in bed comfortable; not following any commands this am Head: Normocephalic, without obvious abnormality, atraumatic Eyes: EOMI Lungs: clear to auscultation bilaterally Heart: regular rate and rhythm and S1, S2 normal Abdomen: soft, NT, ND, BS present; PEG in palce Extremities: no edema Skin: xerosis Neurologic: no movement with right side; can move left side fairly well  Consultants:   Neuro  GI  Procedures:   none  Data Reviewed: I have personally reviewed following labs and imaging studies Results for orders placed or performed during the hospital encounter of 05/05/20 (from the past 24 hour(s))  Glucose, capillary     Status: Abnormal   Collection Time: 07/03/20  8:06 AM  Result Value Ref Range   Glucose-Capillary 105 (H) 70 - 99 mg/dL  Glucose, capillary     Status: Abnormal   Collection Time: 07/03/20 12:48 PM  Result Value Ref Range   Glucose-Capillary 129 (H) 70 - 99 mg/dL  Glucose, capillary     Status: Abnormal   Collection Time: 07/03/20  4:24 PM  Result Value Ref Range   Glucose-Capillary 113 (H) 70 - 99 mg/dL  Glucose, capillary     Status: Abnormal   Collection Time: 07/03/20  8:31 PM  Result Value Ref Range   Glucose-Capillary 121 (H) 70 - 99 mg/dL   Comment 1 Notify RN    Comment 2 Document in Chart   Glucose, capillary     Status: Abnormal   Collection Time: 07/03/20 11:24 PM  Result Value Ref Range   Glucose-Capillary 123 (H) 70 - 99 mg/dL   Comment 1 Notify RN    Comment 2 Document in Chart   Glucose, capillary     Status: Abnormal   Collection Time: 07/04/20  4:57 AM  Result Value Ref Range   Glucose-Capillary 137 (H) 70 - 99 mg/dL   Comment 1 Notify RN    Comment 2 Document  in Chart      Scheduled Meds: . carvedilol  6.25 mg Per Tube BID WC  . chlorhexidine  15 mL Mouth Rinse BID  . feeding supplement  237 mL Oral TID BM  . feeding supplement (OSMOLITE 1.5 CAL)  840 mL Per Tube Q24H  . feeding supplement (PROSource TF)  45 mL Per Tube TID  . free water  200 mL Per Tube Q4H  . insulin aspart  0-9 Units Subcutaneous Q4H  . insulin glargine  5 Units Subcutaneous Daily  . mouth rinse  15 mL Mouth Rinse q12n4p  . nutrition supplement (JUVEN)  1 packet Per Tube BID BM  . pantoprazole sodium  40 mg Per Tube BID  . sennosides  5 mL Per Tube BID  . sodium chloride flush  10-40 mL Intracatheter Q12H   PRN Meds: acetaminophen **OR** acetaminophen (TYLENOL) oral liquid 160 mg/5 mL **OR** acetaminophen, levalbuterol, sodium chloride flush Continuous  Infusions:   LOS: 60 days  Time spent: Greater than 50% of the 35 minute visit was spent in counseling/coordination of care for the patient as laid out in the A&P.   Azucena Fallen, MD [facility] 07/04/2020, 7:40 AM

## 2020-07-05 ENCOUNTER — Other Ambulatory Visit: Payer: Self-pay

## 2020-07-05 DIAGNOSIS — I613 Nontraumatic intracerebral hemorrhage in brain stem: Secondary | ICD-10-CM

## 2020-07-05 LAB — GLUCOSE, CAPILLARY
Glucose-Capillary: 152 mg/dL — ABNORMAL HIGH (ref 70–99)
Glucose-Capillary: 149 mg/dL — ABNORMAL HIGH (ref 70–99)
Glucose-Capillary: 151 mg/dL — ABNORMAL HIGH (ref 70–99)
Glucose-Capillary: 101 mg/dL — ABNORMAL HIGH (ref 70–99)
Glucose-Capillary: 139 mg/dL — ABNORMAL HIGH (ref 70–99)

## 2020-07-05 NOTE — Progress Notes (Signed)
Physical Therapy Treatment Patient Details Name: Alan Everett MRN: [id number] DOB: March 07, 1956 Today's Date: 07/05/2020    History of Present Illness Patient is a 64 y/o male with PMH ETOH use, tobacco abuse, admitted with R side weakness,sensory loss and speech deficits.  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    PT Comments    Patient progressing well towards PT goals. More interactive and verbal today. Pt engaging in session and with all mobility. Requires max A for bed mobility but able to initiate movement of RLE with increased time/cues. Noted to have increased tone in RUE as well as neglect. Worked on finding midline, upright posture, stretching tight lateral flexors for better trunk alignment, cervical AROM, scapular mobilization, and standing in stedy with assist of 2. Used mirror for visual feedback which pt responded well too. Requires multimodal cues for all functional mobility with repetition at times likely due to aphasia. Will follow and progress as tolerated.   Follow Up Recommendations  SNF     Equipment Recommendations  Wheelchair (measurements PT);Wheelchair cushion (measurements PT);Hospital bed;3in1 (PT);Other (comment) (lift)    Recommendations for Other Services       Precautions / Restrictions Precautions Precautions: Fall Precaution Comments: G tube, right neglect Required Braces or Orthoses: Splint/Cast Splint/Cast: soft pillow splint R UE at night Restrictions Weight Bearing Restrictions: No    Mobility  Bed Mobility Overal bed mobility: Needs Assistance Bed Mobility: Supine to Sit     Supine to sit: Mod assist;+2 for physical assistance;HOB elevated     General bed mobility comments: Able to bring LLE to EOB, and initiate bringing RLE as well; cues to pull up onto left elbow, assist with trunk and scooting bottom to EOB. Increased time.  Transfers Overall transfer level: Needs  assistance Equipment used: Ambulation equipment used Transfers: Sit to/from Stand Sit to Stand: Mod assist;+2 physical assistance;Max assist Stand pivot transfers: Total assist;+2 safety/equipment;+2 physical assistance       General transfer comment: Assist of 2 to power to standing from EOB with pt pulling up on stedy bar, support at right elbow. Stood from [employer], from stedy seat x2. Right lateral lean and rotation sitting EOB. Total A to transfer to chair using stedy.  Ambulation/Gait             General Gait Details: Did not attempt this date   Stairs             Wheelchair Mobility    Modified Rankin (Stroke Patients Only) Modified Rankin (Stroke Patients Only) Pre-Morbid Rankin Score: No symptoms Modified Rankin: Severe disability     Balance Overall balance assessment: Needs assistance Sitting-balance support: Feet supported;Single extremity supported Sitting balance-Leahy Scale: Fair Sitting balance - Comments: Pt with moments of Min guard assist with LUE support sitting EOB and on stedy; shortened right trunk, worked on stretching lateral flexors on right by going down on left elbow, activating left lateral flexors and soft tissue work. Used mirror to help pt find midline with trunk/neck. Able to initiate but not sustain for longer than a few seconds. Tightness and elevated left traps with winging right scapula noted. Worked on scapular mobilization/retraction and decreasing tone of RUE. Performed cervical AROM/stretches in all directions.Worked on [employer] through RUE on stedy with support/assist.   Standing balance support: During functional activity;Bilateral upper extremity supported Standing balance-Leahy Scale: Poor Standing balance comment: Bilat UE placed on bar of stedy with tactile cues to extend knee and hips,  min-modAx2 to maintain balance; right lateral lean; able to correct posture to midline with cues and using mirror.                             Cognition Arousal/Alertness: Awake/alert Behavior During Therapy: WFL for tasks assessed/performed Overall Cognitive Status: Impaired/Different from baseline Area of Impairment: Safety/judgement;Awareness;Problem solving;Following commands;Memory;Attention                   Current Attention Level: Sustained Memory: Decreased short-term memory Following Commands: Follows one step commands inconsistently;Follows one step commands with increased time Safety/Judgement: Decreased awareness of safety;Decreased awareness of deficits Awareness: Emergent Problem Solving: Slow processing;Requires verbal cues;Requires tactile cues;Difficulty sequencing;Decreased initiation General Comments: More verbal today; able to understand some speech. Eager to get to chair. Follows commands with multimodal cues including demonstrational/tactile cues. Continues to have right neglect. Did well with mirror for visual feedback during session.      Exercises Other Exercises Other Exercises: Passive stretch to R knee into extension 2x ~30 sec prior to standing. Some discomfort noted.    General Comments        Pertinent Vitals/Pain Pain Assessment: Faces Faces Pain Scale: Hurts little more Pain Location: Rt hip and knee with extension/stretching Pain Descriptors / Indicators: Discomfort;Grimacing;Guarding Pain Intervention(s): Monitored during session;Repositioned;Limited activity within patient's tolerance    Home Living                      Prior Function            PT Goals (current goals can now be found in the care plan section) Progress towards PT goals: Progressing toward goals    Frequency    Min 3X/week      PT Plan Current plan remains appropriate    Co-evaluation   Reason for Co-Treatment: Complexity of the patient's impairments (multi-system involvement);Necessary to address cognition/behavior during functional activity;For patient/therapist safety;To  address functional/ADL transfers PT goals addressed during session: Mobility/safety with mobility;Strengthening/ROM;Balance OT goals addressed during session: ADL's and self-care;Strengthening/ROM      AM-PAC PT "6 Clicks" Mobility   Outcome Measure  Help needed turning from your back to your side while in a flat bed without using bedrails?: A Lot Help needed moving from lying on your back to sitting on the side of a flat bed without using bedrails?: A Lot Help needed moving to and from a bed to a chair (including a wheelchair)?: Total Help needed standing up from a chair using your arms (e.g., wheelchair or bedside chair)?: A Lot Help needed to walk in hospital room?: Total Help needed climbing 3-5 steps with a railing? : Total 6 Click Score: 9    End of Session Equipment Utilized During Treatment: Gait belt Activity Tolerance: Patient tolerated treatment well Patient left: in chair;with call bell/phone within reach;with chair alarm set (chair alarm belt) Nurse Communication: Mobility status;Need for lift equipment (stedy) PT Visit Diagnosis: Unsteadiness on feet (R26.81);Muscle weakness (generalized) (M62.81);Difficulty in walking, not elsewhere classified (R26.2);Other symptoms and signs involving the nervous system (R29.898);Hemiplegia and hemiparesis Hemiplegia - Right/Left: Right Hemiplegia - dominant/non-dominant: Dominant Hemiplegia - caused by: Nontraumatic intracerebral hemorrhage     Time: 1345-1423 PT Time Calculation (min) (ACUTE ONLY): 38 min  Charges:  $Therapeutic Activity: 8-22 mins $Neuromuscular Re-education: 8-22 mins                     Alan Everett, PT, DPT Acute Rehabilitation  Services Pager [phone] Office [phone]       Alan Everett 07/05/2020, 3:36 PM

## 2020-07-05 NOTE — Progress Notes (Signed)
[facility] PROGRESS NOTE  Alan Everett RXV:[id number] DOB: 1956-01-30 DOA: 05/05/2020 PCP: Patient, No Pcp Per    11/16   Status:  Remains inpatient appropriate because:Altered mental status and Unsafe d/c plan  Dispo:  The patient is from: Home              Anticipated d/c is to: SNF vs home with family and home health services              Anticipated d/c date is: > 3 days              Patient currently is medically stable to d/c.  No expectation of full recovery from neurological injury with recommendation for long-term care at skilled nursing facility.     Code Status: Full Family Communication: Patient only-no family at bedside DVT prophylaxis: SCDs only-presented with intraventricular hemorrhage resulting in neurological injury Vaccination status: We will need to discuss with family if patient has previously received Covid vaccination  Foley catheter: No  HPI: 64 year old male patient with history of iron deficiency anemia, alcohol use, cocaine use and tobacco abuse.  Patient presented to the ER by EMS on 10/6 as a code stroke.  At that time he was noted to have acute onset right-sided weakness, sensory loss and aphasia.  CT of the head revealed an enlarging left thalamic capsular intraparenchymal hemorrhage with increasing intraventricular hemorrhage from left ventricle into the third and fourth ventricles.  Neurosurgery consulted but no indication at that time for EVD.  Urine drug screen was found to be positive for cocaine.  Since admission hospital course has been complicated by pneumonia and recurrent blood loss anemia.  It has been noted that in the outpatient setting he had undergone EGD and colonoscopy on 8/2 at [facility] in [place].  No significant abnormalities identified and patient was recommended to continue previous iron.  As of 11/30 communication in regards to global aphasia is improving.  He also has moderate to severe dysphagia and is requiring  feedings via a PEG tube.  Subjective: Awake.  Speech once again consistent with ongoing aphasia.  Objective: Vitals:   07/05/20 0849 07/05/20 1116  BP: 120/73 123/71  Pulse: 92 97  Resp: 18 16  Temp: 98.4 F (36.9 C) 98.5 F (36.9 C)  SpO2: 100% 98%    Intake/Output Summary (Last 24 hours) at 07/05/2020 1238 Last data filed at 07/04/2020 1605 Gross per 24 hour  Intake 870 ml  Output 600 ml  Net 270 ml   Filed Weights   06/18/20 0453 07/03/20 0549 07/04/20 0500  Weight: 60 kg 67.3 kg 46.3 kg    Exam:  Constitutional: NAD, calm Respiratory: Lungs clear to auscultation bilaterally, RA-normal respiratory effort Cardiovascular: Regular rate and rhythm without any tachycardia, adequate capillary refill, no peripheral edema. Abdomen: no tenderness, Bowel sounds positive.  PEG tube tube for feeding -poor intake with only 15 to 25% of meals ingested. Musculoskeletal: RUE neglect/intermittent issues with persistent flexion of RUE Neurologic: CN 2-12 appears to be grossly intact except for notable global aphasia with waxing and waning quality. Sensation intact, Strength 4/5 left side-right side with neglect and paralysis.   Psychiatric: Oriented x name and ? place.  Due to patient's difficulty with verbal communication full psychiatric evaluation in regards to affect and orientation remains difficult   Assessment/Plan: Acute problems: Acute Hemorrhagic stroke/cerebral edema -Etiology 2/2 severe hypertension, alcohol and cocaine use. -Neurosurgery was consulted no indications for surgery present -Follow-up CT head stable.  -As of 11/30  improvements regarding global aphasia, still having some difficulty following commands but speech otherwise significantly improved.  As of 12/2 once again having difficulty with global aphasia.  This is not unexpected noting stroke patients tend to have good days and bad days. -Not on antiplatelet tx secondary to hemorrhagic etiology. LDL  80. -Continue PT and OT.  Noted with improved static and dynamic sitting balance.  Moderate to severe dysphagia 2/2 stroke/severe protein calorie malnutrition -Secondary to stroke. PEG tube placed on 05/21/2020. Nutrition Problem: Severe Malnutrition Etiology: social / environmental circumstances Signs/Symptoms: severe fat depletion, severe muscle depletion Interventions: Tube feeding currently tolerating mechanical soft diet with thin liquids intake.  Also on nocturnal tube feedings.  Oral intake decreasing again.  Patient has previously expressed dislike of foods offered. Estimated body mass index is 16.46 kg/m as calculated from the following:   Height as of 04/09/20: 5\' 6"  (1.676 m).   Weight as of this encounter: 46.3 kg.  LBM 11/30  Acute on chronic anemia -Total of 6 units of PRBC transfusions given in this admission current hemoglobin stable between 8 and 9. -GI consulted. -s/p panendoscopy 03/01/2020 as an OP:  Colonoscopy-sigmoid diverticulosis EGD-normal esophagus, gastritis, normal duodenum and no other abnormal findings -Continue PPI twice daily. (See below regarding history of gastritis on recent EGD)  Stage II buttock decubitus Wound / Incision (Open or Dehisced) 05/21/20 Puncture Abdomen Left;Anterior;Upper G-tube insertion site  (Active)  Date First Assessed/Time First Assessed: 05/21/20 1533   Wound Type: Puncture  Location: Abdomen  Location Orientation: Left;Anterior;Upper  Wound Description (Comments): G-tube insertion site   Present on Admission: No    Assessments 05/21/2020  3:34 PM 07/04/2020  7:30 AM  Dressing Type Gauze (Comment);Tape dressing Gauze (Comment)  Dressing Changed New --  Dressing Status Clean;Dry;Intact --  Dressing Change Frequency PRN --  Site / Wound Assessment Clean;Dry Dry  Peri-wound Assessment Intact --  Closure None --  Drainage Amount None --  Treatment Cleansed --     No Linked orders to display     Pressure Injury 06/02/20  Buttocks Right Stage 2 -  Partial thickness loss of dermis presenting as a shallow open injury with a red, pink wound bed without slough. 7.5 cm in lenth by 4.5 width  (Active)  Date First Assessed/Time First Assessed: 06/02/20 0800   Location: Buttocks  Location Orientation: Right  Staging: Stage 2 -  Partial thickness loss of dermis presenting as a shallow open injury with a red, pink wound bed without slough.  Wound Descri...    Assessments 06/02/2020  8:00 AM 07/04/2020  4:05 PM  Dressing Type -- Foam - Lift dressing to assess site every shift  Wound Length (cm) 7.5 cm --  Wound Width (cm) 4.5 cm --  Wound Surface Area (cm^2) 33.75 cm^2 --  Drainage Amount None --  Treatment Other (Comment) --     No Linked orders to display      Other problems: Goals of care -Poor neurological recovery so far.  Do not expect he will return to baseline level of functioning and likely will require long-term skilled nursing care. -Palliative care was consulted in the past. Patient remains full code.   Bibasilar pneumonia -resolved -CT scan 10/12 showed bibasilar pneumonia.  -Treated with broad-spectrum antibiotics.  -WBC normalized. No longer having fever.  Klebsiella UTI  -resolved -Urine culture report from 10/10 with more than 1000 CFU per mL of Klebsiella pneumoniae.  -Completed 5-day course of IV Rocephin.   Sinus tachycardia Essential hypertension/grade  1 diastolic dysfunction grade 1 diastolic dysfunction -Heart rate and blood pressure both are stable on Coreg 3.125 mg daily. -Echocardiogram this admission revealed preserved LVEF with physiologic findings consistent with diastolic dysfunction  Hypernatremia  - resolved   Elevated liver enzymes -Right upper quadrant ultrasound-cholelithiasis without acute cholecystitis -Chronic mild elevation of liver enzymes persist.  Tobacco Abuse -Cessation counseling this admission deferred in the context of abnormal neurological status  and patient unable to participate  Cocaine abuse -Cessation counseling this admission deferred in the context of abnormal neurological status and patient unable to participate  GERD/recent gastritis on endoscopy August 2021 -PPI twice daily  Data Reviewed: Basic Metabolic Panel: Recent Labs  Lab 07/02/20 0806  NA 138  K 3.7  CL 99  CO2 27  GLUCOSE 115*  BUN 15  CREATININE 0.58*  CALCIUM 9.7   Liver Function Tests: No results for input(s): AST, ALT, ALKPHOS, BILITOT, PROT, ALBUMIN in the last 168 hours. No results for input(s): LIPASE, AMYLASE in the last 168 hours. No results for input(s): AMMONIA in the last 168 hours. CBC: Recent Labs  Lab 07/02/20 0806  WBC 9.5  NEUTROABS 6.5  HGB 9.4*  HCT 30.4*  MCV 93.3  PLT 409*   Cardiac Enzymes: No results for input(s): CKTOTAL, CKMB, CKMBINDEX, TROPONINI in the last 168 hours. BNP (last 3 results) Recent Labs    05/29/20 0409  BNP 23.2    ProBNP (last 3 results) No results for input(s): PROBNP in the last 8760 hours.  CBG: Recent Labs  Lab 07/04/20 2005 07/04/20 2352 07/05/20 0352 07/05/20 0754 07/05/20 1115  GLUCAP 95 111* 152* 149* 151*    No results found for this or any previous visit (from the past 240 hour(s)).   Studies: No results found.  Scheduled Meds: . carvedilol  6.25 mg Per Tube BID WC  . chlorhexidine  15 mL Mouth Rinse BID  . feeding supplement  237 mL Oral TID BM  . feeding supplement (OSMOLITE 1.5 CAL)  840 mL Per Tube Q24H  . feeding supplement (PROSource TF)  45 mL Per Tube TID  . free water  200 mL Per Tube Q4H  . insulin aspart  0-9 Units Subcutaneous Q4H  . insulin glargine  5 Units Subcutaneous Daily  . mouth rinse  15 mL Mouth Rinse q12n4p  . nutrition supplement (JUVEN)  1 packet Per Tube BID BM  . pantoprazole sodium  40 mg Per Tube BID  . sennosides  5 mL Per Tube BID  . sodium chloride flush  10-40 mL Intracatheter Q12H   Continuous Infusions:  Principal  Problem:   ICH (intracerebral hemorrhage) (HCC) Active Problems:   Polysubstance abuse (HCC)   Dysphagia due to recent cerebrovascular accident (CVA)   Hyperglycemia   Hypokalemia   Hypomagnesemia   Fever   SOB (shortness of breath)   Leukocytosis   Aspiration pneumonia of both lower lobes (HCC)   Palliative care by specialist   Goals of care, counseling/discussion   DNR (do not resuscitate) discussion   Protein-calorie malnutrition, severe   Heme positive stool   Acute lower UTI   Acute blood loss anemia   Pressure injury of skin   Consultants:  Neurology  Gastroenterology  Procedures:  Echocardiogram  Core track trach tube  PEG tube  Antibiotics: Anti-infectives (From admission, onward)   Start     Dose/Rate Route Frequency Ordered Stop   05/28/20 0900  cefTRIAXone (ROCEPHIN) 1 g in sodium chloride 0.9 % 100 mL IVPB  1 g 200 mL/hr over 30 Minutes Intravenous Every 24 hours 05/28/20 0807 06/01/20 0855   05/21/20 1515  ceFAZolin (ANCEF) IVPB 2g/100 mL premix        2 g 200 mL/hr over 30 Minutes Intravenous To Radiology 05/21/20 1429 05/21/20 2100   05/14/20 1000  cefTRIAXone (ROCEPHIN) 2 g in sodium chloride 0.9 % 100 mL IVPB        2 g 200 mL/hr over 30 Minutes Intravenous Daily 05/13/20 1454 05/15/20 1255   05/13/20 2200  metroNIDAZOLE (FLAGYL) tablet 500 mg        500 mg Per Tube Every 8 hours 05/13/20 1847 05/15/20 2219   05/13/20 1400  metroNIDAZOLE (FLAGYL) tablet 500 mg  Status:  Discontinued        500 mg Oral Every 8 hours 05/13/20 0935 05/13/20 1847   05/12/20 0900  cefTRIAXone (ROCEPHIN) 1 g in sodium chloride 0.9 % 100 mL IVPB  Status:  Discontinued        1 g 200 mL/hr over 30 Minutes Intravenous Daily 05/12/20 0817 05/13/20 1454   05/12/20 0900  azithromycin (ZITHROMAX) 500 mg in sodium chloride 0.9 % 250 mL IVPB  Status:  Discontinued        500 mg 250 mL/hr over 60 Minutes Intravenous Daily 05/12/20 0817 05/13/20 0935   05/09/20 0930   Ampicillin-Sulbactam (UNASYN) 3 g in sodium chloride 0.9 % 100 mL IVPB  Status:  Discontinued        3 g 200 mL/hr over 30 Minutes Intravenous Every 8 hours 05/09/20 0837 05/09/20 0839   05/09/20 0930  Ampicillin-Sulbactam (UNASYN) 3 g in sodium chloride 0.9 % 100 mL IVPB  Status:  Discontinued        3 g 200 mL/hr over 30 Minutes Intravenous Every 6 hours 05/09/20 0839 05/12/20 0817       Time spent: 20 minutes    Junious Silk ANP  [facility] Pager [phone].  07/05/2020, 12:38 PM  LOS: 61 days

## 2020-07-05 NOTE — Progress Notes (Signed)
Occupational Therapy Treatment Patient Details Name: Alan Everett MRN: [id number] DOB: Oct 01, 1955 Today's Date: 07/05/2020    History of present illness Patient is a 64 y/o male with PMH ETOH use, tobacco abuse, admitted with R side weakness,sensory loss and speech deficits.  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   OT comments  Patient continues to make steady progress towards goals in skilled OT session. Patient's session encompassed use of mirror and stedy in conjunction with PT in order to address sit<>stand transfers, and work on stretching and trunk rotation to come to midline due to tight L trap and tight R abdomen and RUE. Pt noted to have winging of R with max multi-modal cues to engage muscles to promote scapular retraction (able to complete minimally). Pt noted to be much more expressive in speech and able to state he wanted to get to the chair. Pt requiring max A to self feed with RUE initially, but with increased repetitions able to complete. Discharge remains appropriate in order to progress; therapy to continue to follow.    Follow Up Recommendations  CIR;Supervision/Assistance - 24 hour    Equipment Recommendations  Wheelchair (measurements OT);Wheelchair cushion (measurements OT);Hospital bed;Other (comment)    Recommendations for Other Services Other (comment)    Precautions / Restrictions Precautions Precautions: Fall Precaution Comments: G tube, right neglect Required Braces or Orthoses: Splint/Cast Splint/Cast: soft pillow splint R UE at night Restrictions Weight Bearing Restrictions: No       Mobility Bed Mobility Overal bed mobility: Needs Assistance Bed Mobility: Supine to Sit     Supine to sit: Mod assist;+2 for physical assistance;HOB elevated     General bed mobility comments: Able to bring LLE to EOB, and initiate bringing RLE as well; cues to pull up onto left elbow, assist with trunk  and scooting bottom to EOB. Increased time.  Transfers Overall transfer level: Needs assistance Equipment used: Ambulation equipment used Transfers: Sit to/from Stand Sit to Stand: Mod assist;+2 physical assistance;Max assist Stand pivot transfers: Total assist;+2 safety/equipment;+2 physical assistance       General transfer comment: Assist of 2 to power to standing from EOB with pt pulling up on stedy bar, support at right elbow. Stood from [employer], from stedy seat x2. Right lateral lean and rotation sitting EOB. Total A to transfer to chair using stedy.    Balance Overall balance assessment: Needs assistance Sitting-balance support: Feet supported;Single extremity supported Sitting balance-Leahy Scale: Fair Sitting balance - Comments: Pt with moments of Min guard assist with LUE support sitting EOB and on stedy; shortened right trunk, worked on stretching lateral flexors on right by going down on left elbow, activating left lateral flexors and soft tissue work. Used mirror to help pt find midline with trunk/neck. Able to initiate but not sustain for longer than a few seconds. Tightness and elevated left traps with winging right scapula noted. Worked on scapular mobilization/retraction and decreasing tone of RUE. Performed cervical AROM/stretches in all directions.Worked on [employer] through RUE on stedy with support/assist.   Standing balance support: During functional activity;Bilateral upper extremity supported Standing balance-Leahy Scale: Poor Standing balance comment: Bilat UE placed on bar of stedy with tactile cues to extend knee and hips, min-modAx2 to maintain balance; right lateral lean; able to correct posture to midline with cues and using mirror.  ADL either performed or assessed with clinical judgement   ADL Overall ADL's : Needs assistance/impaired Eating/Feeding: Maximal assistance Eating/Feeding Details (indicate cue type and reason): pt  with max A to use spoon with L hand instead of bringing magic cup and peaches to mouth to eat                                 Functional mobility during ADLs: Moderate assistance;+2 for physical assistance;+2 for safety/equipment (sit<>stand with stedy) General ADL Comments: Focused session on sitting balance, trunk control, cognition, and weight bearing through RUE. Therapy using mirror to work on sit<>stands, and  trunk rotation as pt presenting in rotated position sitting EOB to do increased tightness in L trap and R abdomen and RUE     Vision       Perception     Praxis      Cognition Arousal/Alertness: Awake/alert Behavior During Therapy: WFL for tasks assessed/performed Overall Cognitive Status: Impaired/Different from baseline Area of Impairment: Safety/judgement;Awareness;Problem solving;Following commands;Memory;Attention                   Current Attention Level: Sustained Memory: Decreased short-term memory Following Commands: Follows one step commands inconsistently;Follows one step commands with increased time Safety/Judgement: Decreased awareness of safety;Decreased awareness of deficits Awareness: Emergent Problem Solving: Slow processing;Requires verbal cues;Requires tactile cues;Difficulty sequencing;Decreased initiation General Comments: More verbal today; able to understand some speech. Eager to get to chair. Follows commands with multimodal cues including demonstrational/tactile cues. Continues to have right neglect. Did well with mirror for visual feedback during session.        Exercises Other Exercises Other Exercises: Passive stretch to R knee into extension 2x ~30 sec prior to standing. Some discomfort noted.   Shoulder Instructions       General Comments      Pertinent Vitals/ Pain       Pain Assessment: Faces Faces Pain Scale: Hurts little more Pain Location: Rt hip and knee with extension/stretching Pain Descriptors /  Indicators: Discomfort;Grimacing;Guarding Pain Intervention(s): Monitored during session;Repositioned;Limited activity within patient's tolerance  Home Living                                          Prior Functioning/Environment              Frequency  Min 2X/week        Progress Toward Goals  OT Goals(current goals can now be found in the care plan section)  Progress towards OT goals: Progressing toward goals  Acute Rehab OT Goals Patient Stated Goal: to get better OT Goal Formulation: Patient unable to participate in goal setting Time For Goal Achievement: 07/09/20 Potential to Achieve Goals: Good  Plan Discharge plan remains appropriate;Frequency remains appropriate    Co-evaluation      Reason for Co-Treatment: Complexity of the patient's impairments (multi-system involvement);Necessary to address cognition/behavior during functional activity;For patient/therapist safety;To address functional/ADL transfers PT goals addressed during session: Mobility/safety with mobility;Strengthening/ROM;Balance OT goals addressed during session: ADL's and self-care;Strengthening/ROM      AM-PAC OT "6 Clicks" Daily Activity     Outcome Measure   Help from another person eating meals?: A Lot Help from another person taking care of personal grooming?: A Lot Help from another person toileting, which includes using toliet, bedpan, or urinal?: Total Help from another  person bathing (including washing, rinsing, drying)?: Total Help from another person to put on and taking off regular upper body clothing?: Total Help from another person to put on and taking off regular lower body clothing?: Total 6 Click Score: 8    End of Session Equipment Utilized During Treatment: Gait belt;Other (comment) Antony Salmon)  OT Visit Diagnosis: Unsteadiness on feet (R26.81);Muscle weakness (generalized) (M62.81) Hemiplegia - Right/Left: Right Hemiplegia - dominant/non-dominant:  Dominant   Activity Tolerance Patient tolerated treatment well   Patient Left in chair;with call bell/phone within reach;with chair alarm set   Nurse Communication Mobility status        Time: 0814-4818 OT Time Calculation (min): 38 min  Charges: OT General Charges $OT Visit: 1 Visit OT Treatments $Self Care/Home Management : 8-22 mins  Pollyann Glen E. Agatha Duplechain, COTA/L Acute Rehabilitation Services [phone]   Alan Everett 07/05/2020, 3:37 PM

## 2020-07-06 LAB — GLUCOSE, CAPILLARY
Glucose-Capillary: 124 mg/dL — ABNORMAL HIGH (ref 70–99)
Glucose-Capillary: 125 mg/dL — ABNORMAL HIGH (ref 70–99)
Glucose-Capillary: 128 mg/dL — ABNORMAL HIGH (ref 70–99)
Glucose-Capillary: 135 mg/dL — ABNORMAL HIGH (ref 70–99)
Glucose-Capillary: 137 mg/dL — ABNORMAL HIGH (ref 70–99)
Glucose-Capillary: 137 mg/dL — ABNORMAL HIGH (ref 70–99)
Glucose-Capillary: 141 mg/dL — ABNORMAL HIGH (ref 70–99)

## 2020-07-06 NOTE — Progress Notes (Signed)
[facility] PROGRESS NOTE  Alan SierrasJiles Everett ZOX:[id number] DOB: 19-May-1956 DOA: 05/05/2020 PCP: Patient, No Pcp Per    11/16   Status:  Remains inpatient appropriate because:Altered mental status and Unsafe d/c plan  Dispo:  The patient is from: Home              Anticipated d/c is to: SNF vs home with family and home health services              Anticipated d/c date is: > 3 days              Patient currently is medically stable to d/c.  No expectation of full recovery from neurological injury with recommendation for long-term care at skilled nursing facility.     Code Status: Full Family Communication: Spoke with daughter yesterday 12/6 by phone DVT prophylaxis: SCDs only-presented with intraventricular hemorrhage resulting in neurological injury Vaccination status: We will need to discuss with family if patient has previously received Covid vaccination  Foley catheter: No  HPI: 64 year old male patient with history of iron deficiency anemia, alcohol use, cocaine use and tobacco abuse.  Patient presented to the ER by EMS on 10/6 as a code stroke.  At that time he was noted to have acute onset right-sided weakness, sensory loss and aphasia.  CT of the head revealed an enlarging left thalamic capsular intraparenchymal hemorrhage with increasing intraventricular hemorrhage from left ventricle into the third and fourth ventricles.  Neurosurgery consulted but no indication at that time for EVD.  Urine drug screen was found to be positive for cocaine.  Since admission hospital course has been complicated by pneumonia and recurrent blood loss anemia.  It has been noted that in the outpatient setting he had undergone EGD and colonoscopy on 8/2 [facility] [place].  No significant abnormalities identified and patient was recommended to continue previous iron.  As of 11/30 communication in regards to global aphasia is improving.  He also has moderate to severe dysphagia and is  requiring feedings via a PEG tube.  Subjective: Awake.  With continued engagement responses seem to be a little bit more appropriate.  RN at bedside reports clogged feeding tube-orders given to initiate unclogging protocol.  Objective: Vitals:   07/06/20 0415 07/06/20 0735  BP: (!) 119/51 121/60  Pulse: 99 97  Resp: 18 18  Temp: 98.7 F (37.1 C) 98.4 F (36.9 C)  SpO2: 100% 100%   No intake or output data in the 24 hours ending 07/06/20 0853 Filed Weights   06/18/20 0453 07/03/20 0549 07/04/20 0500  Weight: 60 kg 67.3 kg 46.3 kg    Exam:  Constitutional: NAD, calm Respiratory: Lungs clear to auscultation bilaterally, RA-normal respiratory effort Cardiovascular: Regular rate and rhythm without any tachycardia, adequate capillary refill, no peripheral edema. Abdomen: no tenderness, Bowel sounds positive.  PEG tube tube for feeding -poor intake with only 15 to 25% of meals ingested. Musculoskeletal: RUE neglect/intermittent issues with persistent flexion of RUE Neurologic: CN 2-12 appears to be grossly intact except for notable global aphasia with waxing and waning quality. Sensation intact, Strength 4/5 left side-right side with neglect and paralysis.   Psychiatric: Oriented x name and ? place.  Difficult to obtain adequate mentation/psychiatric evaluation given waxing and waning aphasia   Assessment/Plan: Acute problems: Acute Hemorrhagic stroke/cerebral edema -Etiology 2/2 severe hypertension, alcohol and cocaine use. -Neurosurgery was consulted no indications for surgery present -Follow-up CT head stable.  -As of 11/30 improvements regarding global aphasia, still having some  difficulty following commands but speech otherwise significantly improved.  As of 12/2 once again having difficulty with global aphasia.  This is not unexpected noting stroke patients tend to have good days and bad days. -Not on antiplatelet tx secondary to hemorrhagic etiology. LDL 80. -Continue PT  and OT.  Noted with improved static and dynamic sitting balance.  Moderate to severe dysphagia 2/2 stroke/severe protein calorie malnutrition -Secondary to stroke. PEG tube placed on 05/21/2020. Nutrition Problem: Severe Malnutrition Etiology: social / environmental circumstances Signs/Symptoms: severe fat depletion, severe muscle depletion Interventions: Tube feeding currently tolerating mechanical soft diet with thin liquids intake.  Also on nocturnal tube feedings.  Oral intake decreasing again.  Patient has previously expressed dislike of foods offered. Estimated body mass index is 16.46 kg/m as calculated from the following:   Height as of 04/09/20: 5\' 6"  (1.676 m).   Weight as of this encounter: 46.3 kg.  LBM 11/30  Acute on chronic anemia -Total of 6 units of PRBC transfusions given in this admission current hemoglobin stable between 8 and 9. -GI consulted no indication for inpatient endoscopy noting patient status post outpatient endoscopy in August 2021:  Colonoscopy-sigmoid diverticulosis; EGD-normal esophagus, gastritis, normal duodenum and no other abnormal findings -Continue PPI twice daily. (See below regarding history of gastritis on recent EGD)  Stage II buttock decubitus Wound / Incision (Open or Dehisced) 05/21/20 Puncture Abdomen Left;Anterior;Upper G-tube insertion site  (Active)  Date First Assessed/Time First Assessed: 05/21/20 1533   Wound Type: Puncture  Location: Abdomen  Location Orientation: Left;Anterior;Upper  Wound Description (Comments): G-tube insertion site   Present on Admission: No    Assessments 05/21/2020  3:34 PM 07/05/2020  8:00 AM  Dressing Type Gauze (Comment);Tape dressing Gauze (Comment)  Dressing Changed New New  Dressing Status Clean;Dry;Intact Dry;Intact  Dressing Change Frequency PRN PRN  Site / Wound Assessment Clean;Dry Dry  Peri-wound Assessment Intact Intact  Margins -- Attached edges (approximated)  Closure None None  Drainage  Amount None None  Treatment Cleansed --     No Linked orders to display     Pressure Injury 06/02/20 Buttocks Right Stage 2 -  Partial thickness loss of dermis presenting as a shallow open injury with a red, pink wound bed without slough. 7.5 cm in lenth by 4.5 width  (Active)  Date First Assessed/Time First Assessed: 06/02/20 0800   Location: Buttocks  Location Orientation: Right  Staging: Stage 2 -  Partial thickness loss of dermis presenting as a shallow open injury with a red, pink wound bed without slough.  Wound Descri...    Assessments 06/02/2020  8:00 AM 07/05/2020  8:00 AM  Dressing Type -- Foam - Lift dressing to assess site every shift  Dressing -- Intact  Dressing Change Frequency -- Every 3 days  Wound Length (cm) 7.5 cm --  Wound Width (cm) 4.5 cm --  Wound Surface Area (cm^2) 33.75 cm^2 --  Margins -- Attached edges (approximated)  Drainage Amount None None  Treatment Other (Comment) Cleansed     No Linked orders to display      Other problems: Goals of care -Poor neurological recovery so far.  Do not expect he will return to baseline level of functioning and likely will require long-term skilled nursing care. -Palliative care was consulted in the past. Patient remains full code.   Bibasilar pneumonia -resolved -CT scan 10/12 showed bibasilar pneumonia.  -Treated with broad-spectrum antibiotics.  -WBC normalized. No longer having fever.  Klebsiella UTI  -resolved -Urine culture report  from 10/10 with more than 1000 CFU per mL of Klebsiella pneumoniae.  -Completed 5-day course of IV Rocephin.   Sinus tachycardia Essential hypertension/grade 1 diastolic dysfunction grade 1 diastolic dysfunction -Heart rate and blood pressure both are stable on Coreg 3.125 mg daily. -Echocardiogram this admission revealed preserved LVEF with physiologic findings consistent with diastolic dysfunction  Hypernatremia  - resolved   Elevated liver enzymes -Right upper  quadrant ultrasound-cholelithiasis without acute cholecystitis -Chronic mild elevation of liver enzymes persist.  Tobacco Abuse -Cessation counseling this admission deferred in the context of abnormal neurological status and patient unable to participate  Cocaine abuse -Cessation counseling this admission deferred in the context of abnormal neurological status and patient unable to participate  GERD/recent gastritis on endoscopy August 2021 -PPI twice daily  Data Reviewed: Basic Metabolic Panel: Recent Labs  Lab 07/02/20 0806  NA 138  K 3.7  CL 99  CO2 27  GLUCOSE 115*  BUN 15  CREATININE 0.58*  CALCIUM 9.7   Liver Function Tests: No results for input(s): AST, ALT, ALKPHOS, BILITOT, PROT, ALBUMIN in the last 168 hours. No results for input(s): LIPASE, AMYLASE in the last 168 hours. No results for input(s): AMMONIA in the last 168 hours. CBC: Recent Labs  Lab 07/02/20 0806  WBC 9.5  NEUTROABS 6.5  HGB 9.4*  HCT 30.4*  MCV 93.3  PLT 409*   Cardiac Enzymes: No results for input(s): CKTOTAL, CKMB, CKMBINDEX, TROPONINI in the last 168 hours. BNP (last 3 results) Recent Labs    05/29/20 0409  BNP 23.2    ProBNP (last 3 results) No results for input(s): PROBNP in the last 8760 hours.  CBG: Recent Labs  Lab 07/05/20 1644 07/05/20 2007 07/06/20 0009 07/06/20 0402 07/06/20 0734  GLUCAP 101* 139* 125* 141* 137*    No results found for this or any previous visit (from the past 240 hour(s)).   Studies: No results found.  Scheduled Meds: . carvedilol  6.25 mg Per Tube BID WC  . chlorhexidine  15 mL Mouth Rinse BID  . feeding supplement  237 mL Oral TID BM  . feeding supplement (OSMOLITE 1.5 CAL)  840 mL Per Tube Q24H  . feeding supplement (PROSource TF)  45 mL Per Tube TID  . free water  200 mL Per Tube Q4H  . insulin aspart  0-9 Units Subcutaneous Q4H  . insulin glargine  5 Units Subcutaneous Daily  . mouth rinse  15 mL Mouth Rinse q12n4p  .  nutrition supplement (JUVEN)  1 packet Per Tube BID BM  . pantoprazole sodium  40 mg Per Tube BID  . sennosides  5 mL Per Tube BID  . sodium chloride flush  10-40 mL Intracatheter Q12H   Continuous Infusions:  Principal Problem:   ICH (intracerebral hemorrhage) (HCC) Active Problems:   Polysubstance abuse (HCC)   Dysphagia due to recent cerebrovascular accident (CVA)   Hyperglycemia   Hypokalemia   Hypomagnesemia   Fever   SOB (shortness of breath)   Leukocytosis   Aspiration pneumonia of both lower lobes (HCC)   Palliative care by specialist   Goals of care, counseling/discussion   DNR (do not resuscitate) discussion   Protein-calorie malnutrition, severe   Heme positive stool   Acute lower UTI   Acute blood loss anemia   Pressure injury of skin   Consultants:  Neurology  Gastroenterology  Procedures:  Echocardiogram  Core track trach tube  PEG tube  Antibiotics: Anti-infectives (From admission, onward)   Start  Dose/Rate Route Frequency Ordered Stop   05/28/20 0900  cefTRIAXone (ROCEPHIN) 1 g in sodium chloride 0.9 % 100 mL IVPB        1 g 200 mL/hr over 30 Minutes Intravenous Every 24 hours 05/28/20 0807 06/01/20 0855   05/21/20 1515  ceFAZolin (ANCEF) IVPB 2g/100 mL premix        2 g 200 mL/hr over 30 Minutes Intravenous To Radiology 05/21/20 1429 05/21/20 2100   05/14/20 1000  cefTRIAXone (ROCEPHIN) 2 g in sodium chloride 0.9 % 100 mL IVPB        2 g 200 mL/hr over 30 Minutes Intravenous Daily 05/13/20 1454 05/15/20 1255   05/13/20 2200  metroNIDAZOLE (FLAGYL) tablet 500 mg        500 mg Per Tube Every 8 hours 05/13/20 1847 05/15/20 2219   05/13/20 1400  metroNIDAZOLE (FLAGYL) tablet 500 mg  Status:  Discontinued        500 mg Oral Every 8 hours 05/13/20 0935 05/13/20 1847   05/12/20 0900  cefTRIAXone (ROCEPHIN) 1 g in sodium chloride 0.9 % 100 mL IVPB  Status:  Discontinued        1 g 200 mL/hr over 30 Minutes Intravenous Daily 05/12/20 0817  05/13/20 1454   05/12/20 0900  azithromycin (ZITHROMAX) 500 mg in sodium chloride 0.9 % 250 mL IVPB  Status:  Discontinued        500 mg 250 mL/hr over 60 Minutes Intravenous Daily 05/12/20 0817 05/13/20 0935   05/09/20 0930  Ampicillin-Sulbactam (UNASYN) 3 g in sodium chloride 0.9 % 100 mL IVPB  Status:  Discontinued        3 g 200 mL/hr over 30 Minutes Intravenous Every 8 hours 05/09/20 0837 05/09/20 0839   05/09/20 0930  Ampicillin-Sulbactam (UNASYN) 3 g in sodium chloride 0.9 % 100 mL IVPB  Status:  Discontinued        3 g 200 mL/hr over 30 Minutes Intravenous Every 6 hours 05/09/20 0839 05/12/20 0817       Time spent: 20 minutes    Junious Silk ANP  [facility] Pager [phone].  07/06/2020, 8:53 AM  LOS: 62 days

## 2020-07-06 NOTE — Progress Notes (Signed)
Nutrition Follow-up  DOCUMENTATION CODES:   Severe malnutrition in context of social or environmental circumstances  INTERVENTION:  Once PEG is unclogged and ready for use, continue nocturnal tube feeding regimen: -Osmolite 1.5 @ 3ml/hr for 12 hours from 1800-0600 -31ml Prosource TF TID - free water Q4H  Nocturnal TF regimen will provide 1380 kcals (meets 76% estimated minimum calorie needs), 86 grams of protein (meets 100% minimum protein needs), free water ( total free water with flushes)   If unable to unclog PEG at bedside, recommend consulting IR. Pt is not taking in enough PO to meet his kcal/protein needs and should continue receiving nocturnal TF.   -Continue Juven BID via PEG for wound healing -Continue Ensure Enlive po TID -Staff should continue to assist pt with meals/supplements/snacks  NUTRITION DIAGNOSIS:   Severe Malnutrition related to social / environmental circumstances as evidenced by severe fat depletion, severe muscle depletion.  ongoing  GOAL:   Patient will meet greater than or equal to 90% of their needs  progressing  MONITOR:   TF tolerance, Diet advancement, Weight trends, Skin  REASON FOR ASSESSMENT:   Consult, New TF Enteral/tube feeding initiation and management  ASSESSMENT:   64 yo male presents with acute onset of right sided weakness and mixed recept and expressive aphasia and admitted with acute left thalamocapsular ICH, severe HTN. PMH includes EtOH and tobacco abuse  10/06 Admitted 10/08 Cortrak placed(gastric) 10/13 repeat CT head showed stable L thalamic hemorrhageand intraventricular extension since 05/08/2020. Regional edema, mild regional mass-effect and mild lateral ventriculomegaly and trace SAH. 10/19 failed MBS 10/22 s/p PEG 11/22 diet advanced to dysphagia 2 with thin liquids  Pt still awaiting placement.   Pt continues to have waxing and waning aphasia. RD unable to obtain information from pt at  this time.   Pt's intake appears to have decreased; meal completions charted as 15-25% x 3 recorded meals since last RD assessment (21% average meal intake). MD also noted pt's decreasing PO intake. Pt does have orders for Ensure Enlive po TID, but his intake of these supplements has been quite variable per RN.   Pt has been receiving nocturnal TF to help meet his kcal/protein needs. Current nocturnal TF regimen via PEG: Osmolite 1.5 @ 72ml/hr for 12 hours from 1800-0600, 42ml Prosource TF TID, free water Q4H. Nocturnal TF regimen provides 1380 kcals (meets 76% estimated minimum calorie needs), 86 grams of protein (meets 100% minimum protein needs), free water ( total free water with flushes)  Per RN, feeding tube is clogged today. Unclogging protocol has been initiated. Once tube is unclogged, continue with TF regimen as described above. If unable to unclog PEG at bedside, recommend consulting IR. Pt is not taking in enough PO to meet his kcal/protein needs and should continue receiving nocturnal TF.   No UOP documented.   Admission weight: 65.7 kg Current weight (as documented on 07/03/20 -- suspect 07/04/20 weight was estimated rather than measured): 67.3 kg  Labs: CBGs [phone] Medications: ss novolog Q4H, 5 units lantus daily, juven BID, senokot  Diet Order:   Diet Order            DIET DYS 2 Room service appropriate? No; Fluid consistency: Thin  Diet effective now                 EDUCATION NEEDS:   Not appropriate for education at this time  Skin:  Skin Assessment: Reviewed RN Assessment Skin Integrity Issues:: Stage II, Other (Comment) Stage II:  R buttocks Other: puncture abdomen  Last BM:  12/6  Height:   Ht Readings from Last 1 Encounters:  04/09/20 5\' 6"  (1.676 m)    Weight:   Wt Readings from Last 1 Encounters:  07/04/20 46.3 kg   BMI:  Body mass index is 16.46 kg/m.  Estimated Nutritional Needs:   Kcal:  1780-1980 kcals  Protein:   85-95 g  Fluid:  >/= 1.8 L    14/05/21, MS, RD, LDN RD pager number and weekend/on-call pager number located in [place].

## 2020-07-06 NOTE — Progress Notes (Signed)
  Speech Language Pathology Treatment: Cognitive-Linquistic;Dysphagia  Patient Details Name: Alan Everett MRN: [id number] DOB: [birth year] Today's Date: 07/06/2020 Time: 7619-5093 SLP Time Calculation (min) (ACUTE ONLY): 25 min  Assessment / Plan / Recommendation Clinical Impression  Patient seen for aphasia tx but was also observed with PO intake of thin liquids via cup sips. Patient indicated that he couldn't use straw by starting to take lid and straw off cup. He was able to take cup sips, initially with mod-max anterior spillage, but this improved to min when SLP helped reposition. Patient was able to identify correct spelling of  his first name in field of two, and pointed to his correct age and birthdate in field of two by pointing. He did require verbal and tactile cues to initiate pointing. He produced automatic social language utterances such as "thank you" "good bye" and did ask SLP "whats the cost?" when SLP asked him what he would like to drink. Majority of spontaneous language was unintelligible though patient did vary intonation and facial expressions as if having a meaningful conversation.    HPI HPI: Alan Everett is a 64 y.o. male with a PMHx of alcohol use and tobacco abuse who presents acutely to the ED via EMS as a Code Stroke for acute onset of right sided weakness. Patient with disoriented, right leg weakness, right limb ataxia, right decreased sensation and Global aphasia on exam. 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 Worsening of intraventricular extension.  MBS 10/19 mod-severe dysphagia, continue NPO. PEG 10/22.  Repeat MBS 10/30 with improvements. Started dys1/nectar 11/2.       SLP Plan  Continue with current plan of care       Recommendations  Diet recommendations: Thin liquid;Dysphagia 3 (mechanical soft) Liquids provided via: Cup;Straw Medication Administration: Whole meds with puree Supervision: Staff to assist with self feeding Compensations:  Minimize environmental distractions;Monitor for anterior loss Postural Changes and/or Swallow Maneuvers: Seated upright 90 degrees                Oral Care Recommendations: Oral care BID Follow up Recommendations: Skilled Nursing facility;Inpatient Rehab SLP Visit Diagnosis: Aphasia (R47.01) Plan: Continue with current plan of care       GO               Angela Nevin, MA, CCC-SLP Speech Therapy [facility]

## 2020-07-06 NOTE — TOC Progression Note (Addendum)
Transition of Care [facility]) - Progression Note    Patient Details  Name: Alan Everett MRN: [id number] Date of Birth: Jan 10, 1956  [facility] CM/SW Contact  Kermit Balo, RN Phone Number: 07/06/2020, 9:18 AM  Clinical Narrative:    CM messaged financial counseling and the patients niece has not returned the needed documents for medicaid. Financial counseling states the niece provided the documents to the patients girlfriend. CM will reach out to her today to see what the delay may be.  Pt currently without any bed offers.  TOC following.   Expected Discharge Plan: Skilled Nursing Facility Barriers to Discharge: Continued Medical Work up, SNF Pending payor source - LOG, SNF Pending bed offer, SNF Pending Medicaid  Expected Discharge Plan and Services Expected Discharge Plan: Skilled Nursing Facility     Post Acute Care Choice: Skilled Nursing Facility Living arrangements for the past 2 months: Single Family Home                                       Social Determinants of Health (SDOH) Interventions    Readmission Risk Interventions No flowsheet data found.

## 2020-07-06 NOTE — Plan of Care (Signed)
  Problem: Education: Goal: Knowledge of General Education information will improve Description: Including pain rating scale, medication(s)/side effects and non-pharmacologic comfort measures Outcome: Progressing   Problem: Health Behavior/Discharge Planning: Goal: Ability to manage health-related needs will improve Outcome: Progressing   Problem: Clinical Measurements: Goal: Ability to maintain clinical measurements within normal limits will improve Outcome: Progressing Goal: Will remain free from infection Outcome: Progressing Goal: Diagnostic test results will improve Outcome: Progressing Goal: Respiratory complications will improve Outcome: Progressing Goal: Cardiovascular complication will be avoided Outcome: Progressing   Problem: Activity: Goal: Risk for activity intolerance will decrease Outcome: Progressing   Problem: Nutrition: Goal: Adequate nutrition will be maintained Outcome: Progressing   Problem: Coping: Goal: Level of anxiety will decrease Outcome: Progressing   Problem: Elimination: Goal: Will not experience complications related to bowel motility Outcome: Progressing Goal: Will not experience complications related to urinary retention Outcome: Progressing   Problem: Pain Managment: Goal: General experience of comfort will improve Outcome: Progressing   Problem: Safety: Goal: Ability to remain free from injury will improve Outcome: Progressing   Problem: Skin Integrity: Goal: Risk for impaired skin integrity will decrease Outcome: Progressing   Problem: Education: Goal: Knowledge of disease or condition will improve Outcome: Progressing Goal: Knowledge of secondary prevention will improve Outcome: Progressing Goal: Knowledge of patient specific risk factors addressed and post discharge goals established will improve Outcome: Progressing Goal: Individualized Educational Video(s) Outcome: Progressing   Problem: Coping: Goal: Will verbalize  positive feelings about self Outcome: Progressing Goal: Will identify appropriate support needs Outcome: Progressing   Problem: Health Behavior/Discharge Planning: Goal: Ability to manage health-related needs will improve Outcome: Progressing   Problem: Self-Care: Goal: Ability to participate in self-care as condition permits will improve Outcome: Progressing Goal: Verbalization of feelings and concerns over difficulty with self-care will improve Outcome: Progressing Goal: Ability to communicate needs accurately will improve Outcome: Progressing   Problem: Nutrition: Goal: Risk of aspiration will decrease Outcome: Progressing Goal: Dietary intake will improve Outcome: Progressing   Problem: Intracerebral Hemorrhage Tissue Perfusion: Goal: Complications of Intracerebral Hemorrhage will be minimized Outcome: Progressing   

## 2020-07-07 LAB — GLUCOSE, CAPILLARY
Glucose-Capillary: 104 mg/dL — ABNORMAL HIGH (ref 70–99)
Glucose-Capillary: 105 mg/dL — ABNORMAL HIGH (ref 70–99)
Glucose-Capillary: 127 mg/dL — ABNORMAL HIGH (ref 70–99)
Glucose-Capillary: 132 mg/dL — ABNORMAL HIGH (ref 70–99)
Glucose-Capillary: 132 mg/dL — ABNORMAL HIGH (ref 70–99)
Glucose-Capillary: 142 mg/dL — ABNORMAL HIGH (ref 70–99)

## 2020-07-07 NOTE — Progress Notes (Deleted)
Patient seen and independently examined.  Agree with documentation as per NP Junious Silk.  64 year old man with complicated hospitalization including acute hemorrhagic stroke with cerebral edema in the setting of severe hypertension, alcohol and cocaine use with ongoing global aphasia, dysphagia requiring PEG tube placement late October.  Appears calm and comfortable.  Some speech is appropriate some fluent but inappropriate. Afebrile, vital signs stable Cardiovascular regular rate and rhythm.  No murmur, rub or gallop. Respiratory.  Clear to auscultation bilaterally.  No wheezes, rales or rhonchi.  Normal respiratory effort. Right-sided weakness noted.   CBG stable.  Remains medically stable for discharge once safe discharge secured.  Brendia Sacks, MD [facility]

## 2020-07-07 NOTE — Progress Notes (Addendum)
[facility] PROGRESS NOTE  Alan Everett ZOX:[id number] DOB: [birth year] DOA: 05/05/2020 PCP: Patient, No Pcp Per    11/16   Status:  Remains inpatient appropriate because:Altered mental status and Unsafe d/c plan  Dispo:  The patient is from: Home              Anticipated d/c is to: SNF vs home with family and home health services              Anticipated d/c date is: > 3 days              Patient currently is medically stable to d/c.  No expectation of full recovery from neurological injury with recommendation for long-term care at skilled nursing facility. Barriers to dx: Needs funding for SNF bed, no offers thus far    Code Status: Full Family Communication: Spoke with daughter yesterday 12/6 by phone DVT prophylaxis: SCDs only-presented with intraventricular hemorrhage resulting in neurological injury Vaccination status: We will need to discuss with family if patient has previously received Covid vaccination  Foley catheter: No  HPI: 64 year old male patient with history of iron deficiency anemia, alcohol use, cocaine use and tobacco abuse.  Patient presented to the ER by EMS on 10/6 as a code stroke.  At that time he was noted to have acute onset right-sided weakness, sensory loss and aphasia.  CT of the head revealed an enlarging left thalamic capsular intraparenchymal hemorrhage with increasing intraventricular hemorrhage from left ventricle into the third and fourth ventricles.  Neurosurgery consulted but no indication at that time for EVD.  Urine drug screen was found to be positive for cocaine.  Since admission hospital course has been complicated by pneumonia and recurrent blood loss anemia.  It has been noted that in the outpatient setting he had undergone EGD and colonoscopy on 8/2 at [facility] in [place].  No significant abnormalities identified and patient was recommended to continue previous iron.  As of 11/30 communication in regards to global aphasia is  improving.  He also has moderate to severe dysphagia and is requiring feedings via a PEG tube.  Subjective: Awake.  Again conversation limited by patient's ability to respond with appropriate spontaneous language.  Was able to determine that he is not very hungry and that is why he is not eating.  Made him aware that sometimes after stroke your brain can sometimes make you feel like you are not hungry and that you sometimes have to eat even when you are not hungry.  Subsequently patient stated "I would like to eat now".  Objective: Vitals:   07/07/20 0739 07/07/20 1209  BP: (!) 113/57 113/60  Pulse: 90 91  Resp: 16 16  Temp: 98.1 F (36.7 C) 98.4 F (36.9 C)  SpO2: 100% 100%   No intake or output data in the 24 hours ending 07/07/20 1335 Filed Weights   06/18/20 0453 07/03/20 0549 07/04/20 0500  Weight: 60 kg 67.3 kg 46.3 kg    Exam:  Constitutional: NAD, calm Respiratory: Lungs clear to auscultation, RA-normal respiratory effort Cardiovascular: Regular rate and rhythm, adequate capillary refill, no peripheral edema. Abdomen: no tenderness, Bowel sounds positive.  PEG tube tube for feeding -poor intake with only 15 to 25% of meals ingested. Musculoskeletal: RUE neglect/intermittent issues with persistent flexion of RUE Neurologic: CN 2-12 appears to be grossly intact except for notable global aphasia with waxing and waning quality. Sensation intact, Strength 4/5 left side-dense right side hemiplegia with neglect    Psychiatric: Oriented  x name and ? place.  Difficult to obtain adequate mentation/psychiatric evaluation given waxing and waning aphasia   Assessment/Plan: Acute problems: Acute Hemorrhagic stroke/cerebral edema -Etiology 2/2 severe hypertension, alcohol and cocaine use. -Neurosurgery was consulted no indications for surgery present follow-up CT stable -Waxing and waning issues with communication secondary to global aphasia although receptive aphasia appears to be  improved with expressive aphasia dominant issue. -Not on antiplatelet tx secondary to hemorrhagic etiology. LDL 80. -Continue PT and OT.  Noted with improved static and dynamic sitting balance.  Moderate to severe dysphagia 2/2 stroke/severe protein calorie malnutrition -Secondary to stroke. PEG tube placed on 05/21/2020. Nutrition Problem: Severe Malnutrition Etiology: social / environmental circumstances Signs/Symptoms: severe fat depletion, severe muscle depletion Interventions: Tube feeding currently tolerating mechanical soft diet with thin liquids intake.  Also on nocturnal tube feedings.  Oral intake decreasing again.  Patient has previously expressed dislike of foods offered. Estimated body mass index is 16.46 kg/m as calculated from the following:   Height as of 04/09/20: 5\' 6"  (1.676 m).   Weight as of this encounter: 46.3 kg.  LBM 11/30  Acute on chronic anemia -Total of 6 units of PRBC transfusions given in this admission current hemoglobin stable between 8 and 9. -GI consulted no indication for inpatient endoscopy noting patient status post outpatient endoscopy in August 2021:  Colonoscopy-sigmoid diverticulosis; EGD-normal esophagus, gastritis, normal duodenum and no other abnormal findings -Continue PPI twice daily. (See below regarding history of gastritis on recent EGD)  Stage II buttock decubitus Wound / Incision (Open or Dehisced) 05/21/20 Puncture Abdomen Left;Anterior;Upper G-tube insertion site  (Active)  Date First Assessed/Time First Assessed: 05/21/20 1533   Wound Type: Puncture  Location: Abdomen  Location Orientation: Left;Anterior;Upper  Wound Description (Comments): G-tube insertion site   Present on Admission: No    Assessments 05/21/2020  3:34 PM 07/05/2020  8:00 AM  Dressing Type Gauze (Comment);Tape dressing Gauze (Comment)  Dressing Changed New New  Dressing Status Clean;Dry;Intact Dry;Intact  Dressing Change Frequency PRN PRN  Site / Wound Assessment  Clean;Dry Dry  Peri-wound Assessment Intact Intact  Margins -- Attached edges (approximated)  Closure None None  Drainage Amount None None  Treatment Cleansed --     No Linked orders to display     Pressure Injury 06/02/20 Buttocks Right Stage 2 -  Partial thickness loss of dermis presenting as a shallow open injury with a red, pink wound bed without slough. 7.5 cm in lenth by 4.5 width  (Active)  Date First Assessed/Time First Assessed: 06/02/20 0800   Location: Buttocks  Location Orientation: Right  Staging: Stage 2 -  Partial thickness loss of dermis presenting as a shallow open injury with a red, pink wound bed without slough.  Wound Descri...    Assessments 06/02/2020  8:00 AM 07/05/2020  8:00 AM  Dressing Type -- Foam - Lift dressing to assess site every shift  Dressing -- Intact  Dressing Change Frequency -- Every 3 days  Wound Length (cm) 7.5 cm --  Wound Width (cm) 4.5 cm --  Wound Surface Area (cm^2) 33.75 cm^2 --  Margins -- Attached edges (approximated)  Drainage Amount None None  Treatment Other (Comment) Cleansed     No Linked orders to display      Other problems: Goals of care -Poor neurological recovery so far.  Do not expect he will return to baseline level of functioning and likely will require long-term skilled nursing care. -Palliative care was consulted in the past. Patient remains full  code.   Bibasilar pneumonia -resolved -CT scan 10/12 showed bibasilar pneumonia.  -Treated with broad-spectrum antibiotics.  -WBC normalized. No longer having fever.  Klebsiella UTI  -resolved -Urine culture report from 10/10 with more than 1000 CFU per mL of Klebsiella pneumoniae.  -Completed 5-day course of IV Rocephin.   Sinus tachycardia Essential hypertension/grade 1 diastolic dysfunction grade 1 diastolic dysfunction -Heart rate and blood pressure both are stable on Coreg 3.125 mg daily. -Echocardiogram this admission revealed preserved LVEF with  physiologic findings consistent with diastolic dysfunction  Hypernatremia  - resolved   Elevated liver enzymes -Right upper quadrant ultrasound-cholelithiasis without acute cholecystitis -Chronic mild elevation of liver enzymes persist.  Tobacco Abuse -Cessation counseling this admission deferred in the context of abnormal neurological status and patient unable to participate  Cocaine abuse -Cessation counseling this admission deferred in the context of abnormal neurological status and patient unable to participate  GERD/recent gastritis on endoscopy August 2021 -PPI twice daily  Data Reviewed: Basic Metabolic Panel: Recent Labs  Lab 07/02/20 0806  NA 138  K 3.7  CL 99  CO2 27  GLUCOSE 115*  BUN 15  CREATININE 0.58*  CALCIUM 9.7   Liver Function Tests: No results for input(s): AST, ALT, ALKPHOS, BILITOT, PROT, ALBUMIN in the last 168 hours. No results for input(s): LIPASE, AMYLASE in the last 168 hours. No results for input(s): AMMONIA in the last 168 hours. CBC: Recent Labs  Lab 07/02/20 0806  WBC 9.5  NEUTROABS 6.5  HGB 9.4*  HCT 30.4*  MCV 93.3  PLT 409*   Cardiac Enzymes: No results for input(s): CKTOTAL, CKMB, CKMBINDEX, TROPONINI in the last 168 hours. BNP (last 3 results) Recent Labs    05/29/20 0409  BNP 23.2    ProBNP (last 3 results) No results for input(s): PROBNP in the last 8760 hours.  CBG: Recent Labs  Lab 07/06/20 2018 07/06/20 2341 07/07/20 0338 07/07/20 0819 07/07/20 1211  GLUCAP 137* 124* 132* 104* 127*    No results found for this or any previous visit (from the past 240 hour(s)).   Studies: No results found.  Scheduled Meds: . carvedilol  6.25 mg Per Tube BID WC  . chlorhexidine  15 mL Mouth Rinse BID  . feeding supplement  237 mL Oral TID BM  . feeding supplement (OSMOLITE 1.5 CAL)  840 mL Per Tube Q24H  . feeding supplement (PROSource TF)  45 mL Per Tube TID  . free water  200 mL Per Tube Q4H  . insulin  aspart  0-9 Units Subcutaneous Q4H  . insulin glargine  5 Units Subcutaneous Daily  . mouth rinse  15 mL Mouth Rinse q12n4p  . nutrition supplement (JUVEN)  1 packet Per Tube BID BM  . pantoprazole sodium  40 mg Per Tube BID  . sennosides  5 mL Per Tube BID  . sodium chloride flush  10-40 mL Intracatheter Q12H   Continuous Infusions:  Principal Problem:   ICH (intracerebral hemorrhage) (HCC) Active Problems:   Polysubstance abuse (HCC)   Dysphagia due to recent cerebrovascular accident (CVA)   Hyperglycemia   Hypokalemia   Hypomagnesemia   Fever   SOB (shortness of breath)   Leukocytosis   Aspiration pneumonia of both lower lobes (HCC)   Palliative care by specialist   Goals of care, counseling/discussion   DNR (do not resuscitate) discussion   Protein-calorie malnutrition, severe   Heme positive stool   Acute lower UTI   Acute blood loss anemia   Pressure injury  of skin   Consultants:  Neurology  Gastroenterology  Procedures:  Echocardiogram  Core track trach tube  PEG tube  Antibiotics: Anti-infectives (From admission, onward)   Start     Dose/Rate Route Frequency Ordered Stop   05/28/20 0900  cefTRIAXone (ROCEPHIN) 1 g in sodium chloride 0.9 % 100 mL IVPB        1 g 200 mL/hr over 30 Minutes Intravenous Every 24 hours 05/28/20 0807 06/01/20 0855   05/21/20 1515  ceFAZolin (ANCEF) IVPB 2g/100 mL premix        2 g 200 mL/hr over 30 Minutes Intravenous To Radiology 05/21/20 1429 05/21/20 2100   05/14/20 1000  cefTRIAXone (ROCEPHIN) 2 g in sodium chloride 0.9 % 100 mL IVPB        2 g 200 mL/hr over 30 Minutes Intravenous Daily 05/13/20 1454 05/15/20 1255   05/13/20 2200  metroNIDAZOLE (FLAGYL) tablet 500 mg        500 mg Per Tube Every 8 hours 05/13/20 1847 05/15/20 2219   05/13/20 1400  metroNIDAZOLE (FLAGYL) tablet 500 mg  Status:  Discontinued        500 mg Oral Every 8 hours 05/13/20 0935 05/13/20 1847   05/12/20 0900  cefTRIAXone (ROCEPHIN) 1 g in  sodium chloride 0.9 % 100 mL IVPB  Status:  Discontinued        1 g 200 mL/hr over 30 Minutes Intravenous Daily 05/12/20 0817 05/13/20 1454   05/12/20 0900  azithromycin (ZITHROMAX) 500 mg in sodium chloride 0.9 % 250 mL IVPB  Status:  Discontinued        500 mg 250 mL/hr over 60 Minutes Intravenous Daily 05/12/20 0817 05/13/20 0935   05/09/20 0930  Ampicillin-Sulbactam (UNASYN) 3 g in sodium chloride 0.9 % 100 mL IVPB  Status:  Discontinued        3 g 200 mL/hr over 30 Minutes Intravenous Every 8 hours 05/09/20 0837 05/09/20 0839   05/09/20 0930  Ampicillin-Sulbactam (UNASYN) 3 g in sodium chloride 0.9 % 100 mL IVPB  Status:  Discontinued        3 g 200 mL/hr over 30 Minutes Intravenous Every 6 hours 05/09/20 0839 05/12/20 0817       Time spent: 20 minutes    Junious Silkllison Adora Yeh ANP  [facility] Pager [phone].  07/07/2020, 1:35 PM  LOS: 63 days

## 2020-07-07 NOTE — Plan of Care (Signed)
  Problem: Education: Goal: Knowledge of General Education information will improve Description: Including pain rating scale, medication(s)/side effects and non-pharmacologic comfort measures Outcome: Progressing   Problem: Health Behavior/Discharge Planning: Goal: Ability to manage health-related needs will improve Outcome: Progressing   Problem: Clinical Measurements: Goal: Ability to maintain clinical measurements within normal limits will improve Outcome: Progressing Goal: Will remain free from infection Outcome: Progressing Goal: Diagnostic test results will improve Outcome: Progressing Goal: Respiratory complications will improve Outcome: Progressing Goal: Cardiovascular complication will be avoided Outcome: Progressing   Problem: Activity: Goal: Risk for activity intolerance will decrease Outcome: Progressing   Problem: Nutrition: Goal: Adequate nutrition will be maintained Outcome: Progressing   Problem: Coping: Goal: Level of anxiety will decrease Outcome: Progressing   Problem: Elimination: Goal: Will not experience complications related to bowel motility Outcome: Progressing Goal: Will not experience complications related to urinary retention Outcome: Progressing   Problem: Pain Managment: Goal: General experience of comfort will improve Outcome: Progressing   Problem: Safety: Goal: Ability to remain free from injury will improve Outcome: Progressing   Problem: Skin Integrity: Goal: Risk for impaired skin integrity will decrease Outcome: Progressing   Problem: Education: Goal: Knowledge of disease or condition will improve Outcome: Progressing Goal: Knowledge of secondary prevention will improve Outcome: Progressing Goal: Knowledge of patient specific risk factors addressed and post discharge goals established will improve Outcome: Progressing Goal: Individualized Educational Video(s) Outcome: Progressing   Problem: Coping: Goal: Will verbalize  positive feelings about self Outcome: Progressing Goal: Will identify appropriate support needs Outcome: Progressing   Problem: Health Behavior/Discharge Planning: Goal: Ability to manage health-related needs will improve Outcome: Progressing   Problem: Self-Care: Goal: Ability to participate in self-care as condition permits will improve Outcome: Progressing Goal: Verbalization of feelings and concerns over difficulty with self-care will improve Outcome: Progressing Goal: Ability to communicate needs accurately will improve Outcome: Progressing   Problem: Nutrition: Goal: Risk of aspiration will decrease Outcome: Progressing Goal: Dietary intake will improve Outcome: Progressing   Problem: Intracerebral Hemorrhage Tissue Perfusion: Goal: Complications of Intracerebral Hemorrhage will be minimized Outcome: Progressing   

## 2020-07-07 NOTE — Progress Notes (Signed)
Physical Therapy Treatment Patient Details Name: Alan Everett MRN: [id number] DOB: [birth year] Today's Date: 07/07/2020    History of Present Illness Patient is a 64 y/o male with PMH ETOH use, tobacco abuse, admitted with R side weakness,sensory loss and speech deficits.  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    PT Comments    Pt is making slow progress towards his goals. He continues to demonstrate improved verbal responses to questions. In addition, when provided visual cues through use of a mirror anterior to the pt he was able to identify his balance impairments and correct it to find and maintain midline, but when no visual cues provided he tends to lean to the R in sitting and standing. He required modA to roll, modAx2 to sit up EOB, modAx2 to come to stand from EOB to stedy, and minAx2 to come to stand in the stedy. His main deficits include R side neglect as he continues to demonstrate poor R side activation even with max cues and time to respond. He also displays significant tightness in his R leg as he has difficulty obtaining full R knee and hip extension with standing. Will continue to follow acutely. Current recommendations remain appropriate.  Follow Up Recommendations  SNF     Equipment Recommendations  Wheelchair (measurements PT);Wheelchair cushion (measurements PT);Hospital bed;3in1 (PT);Other (comment) (mechanical lift)    Recommendations for Other Services       Precautions / Restrictions Precautions Precautions: Fall Precaution Comments: G tube, right neglect, prevalon boot Required Braces or Orthoses: Splint/Cast Splint/Cast: soft pillow splint R UE at night Restrictions Weight Bearing Restrictions: No    Mobility  Bed Mobility Overal bed mobility: Needs Assistance Bed Mobility: Rolling;Sidelying to Sit Rolling: Mod assist Sidelying to sit: +2 for safety/equipment;Mod assist       General  bed mobility comments: Cues for hand placement on bed rails and to attempt to bring R leg off EOB, redirecting pt to look at R leg often, but min-no activation noted. ModAx2 to manage trunk and legs to sit up EOB and square hips with EOB.  Transfers Overall transfer level: Needs assistance Equipment used: Ambulation equipment used Transfers: Sit to/from [employer] Sit to Stand: Mod assist;+2 physical assistance;Min assist Stand pivot transfers: Total assist;+2 safety/equipment;+2 physical assistance       General transfer comment: Mod A +2 to power up into standing from EOB with support at R elbow for weight bearing and weight shift and at R knee. Min A for power up from stedy seat. Continued support at R elbow and knee. TA with use of stedy to transfer to chair.   Ambulation/Gait             General Gait Details: Did not attempt this date   Stairs             Wheelchair Mobility    Modified Rankin (Stroke Patients Only) Modified Rankin (Stroke Patients Only) Pre-Morbid Rankin Score: No symptoms Modified Rankin: Severe disability     Balance Overall balance assessment: Needs assistance Sitting-balance support: Single extremity supported;Feet supported Sitting balance-Leahy Scale: Poor Sitting balance - Comments: L hand on L bed rail, progressing from modA to maintain balance to min guard assist when provided visual cues through mirror anterior to pt to find and maintain midline. Postural control: Right lateral lean;Posterior lean Standing balance support: Bilateral upper extremity supported Standing balance-Leahy Scale: Poor Standing balance comment: Bilat UE placed on bar of stedy  with tactile cues to extend knee and hips, min-modAx2 to maintain his balance x3 bouts of ~20-30 sec each.                             Cognition Arousal/Alertness: Awake/alert Behavior During Therapy: WFL for tasks assessed/performed Overall Cognitive  Status: Impaired/Different from baseline Area of Impairment: Safety/judgement;Awareness;Problem solving;Following commands;Memory;Attention                   Current Attention Level: Sustained Memory: Decreased short-term memory Following Commands: Follows one step commands inconsistently;Follows one step commands with increased time Safety/Judgement: Decreased awareness of safety;Decreased awareness of deficits Awareness: Emergent Problem Solving: Slow processing;Requires verbal cues;Requires tactile cues;Difficulty sequencing;Decreased initiation General Comments: Tactile, verbal, and visual cues with use of mirror provided to pt during session and repeated as needed. Slow processing with continued neglect of R side, requiring cues to remain attentive to R. Min to no activation of R side to assist with functional mob, even when provided extra time to respond.       Exercises      General Comments        Pertinent Vitals/Pain Pain Assessment: Faces Faces Pain Scale: Hurts even more Pain Location: R hip and knee with extension Pain Descriptors / Indicators: Discomfort;Grimacing;Guarding;Moaning Pain Intervention(s): Limited activity within patient's tolerance;Monitored during session;Repositioned    Home Living                      Prior Function            PT Goals (current goals can now be found in the care plan section) Acute Rehab PT Goals Patient Stated Goal: to get better PT Goal Formulation: Patient unable to participate in goal setting Time For Goal Achievement: 07/16/20 Potential to Achieve Goals: Fair Progress towards PT goals: Progressing toward goals    Frequency    Min 3X/week      PT Plan Current plan remains appropriate    Co-evaluation              AM-PAC PT "6 Clicks" Mobility   Outcome Measure  Help needed turning from your back to your side while in a flat bed without using bedrails?: A Lot Help needed moving from lying  on your back to sitting on the side of a flat bed without using bedrails?: A Lot Help needed moving to and from a bed to a chair (including a wheelchair)?: Total Help needed standing up from a chair using your arms (e.g., wheelchair or bedside chair)?: A Lot Help needed to walk in hospital room?: Total Help needed climbing 3-5 steps with a railing? : Total 6 Click Score: 9    End of Session Equipment Utilized During Treatment: Gait belt Activity Tolerance: Patient tolerated treatment well Patient left: in chair;with call bell/phone within reach;with chair alarm set;with nursing/sitter in room Nurse Communication: Mobility status;Need for lift equipment;Other (comment) (alarm belt applied per RN request/approval) PT Visit Diagnosis: Unsteadiness on feet (R26.81);Muscle weakness (generalized) (M62.81);Difficulty in walking, not elsewhere classified (R26.2);Other symptoms and signs involving the nervous system (R29.898);Hemiplegia and hemiparesis Hemiplegia - Right/Left: Right Hemiplegia - dominant/non-dominant: Dominant Hemiplegia - caused by: Nontraumatic intracerebral hemorrhage     Time: 1327-1401 PT Time Calculation (min) (ACUTE ONLY): 34 min  Charges:  $Therapeutic Activity: 8-22 mins $Neuromuscular Re-education: 8-22 mins  Raymond Gurney, PT, DPT Acute Rehabilitation Services  Pager: [phone] Office: [phone]    Jewel Baize 07/07/2020, 2:51 PM

## 2020-07-08 LAB — GLUCOSE, CAPILLARY
Glucose-Capillary: 102 mg/dL — ABNORMAL HIGH (ref 70–99)
Glucose-Capillary: 114 mg/dL — ABNORMAL HIGH (ref 70–99)
Glucose-Capillary: 122 mg/dL — ABNORMAL HIGH (ref 70–99)
Glucose-Capillary: 134 mg/dL — ABNORMAL HIGH (ref 70–99)
Glucose-Capillary: 136 mg/dL — ABNORMAL HIGH (ref 70–99)
Glucose-Capillary: 147 mg/dL — ABNORMAL HIGH (ref 70–99)

## 2020-07-08 NOTE — Progress Notes (Signed)
[facility] PROGRESS NOTE  Alan Everett URK:[id number] DOB: July 27, 1956 DOA: 05/05/2020 PCP: Patient, No Pcp Per    11/16   Status:  Remains inpatient appropriate because:Altered mental status and Unsafe d/c plan  Dispo:  The patient is from: Home              Anticipated d/c is to: SNF vs home with family and home health services              Anticipated d/c date is: > 3 days              Patient currently is medically stable to d/c.  No expectation of full recovery from neurological injury with recommendation for long-term care at skilled nursing facility. Barriers to dx: Needs funding for SNF bed, no offers thus far    Code Status: Full Family Communication: Spoke with daughter yesterday 12/6 by phone DVT prophylaxis: SCDs only-presented with intraventricular hemorrhage resulting in neurological injury Vaccination status: We will need to discuss with family if patient has previously received Covid vaccination  Foley catheter: No  HPI: 64 year old male patient with history of iron deficiency anemia, alcohol use, cocaine use and tobacco abuse.  Patient presented to the ER by EMS on 10/6 as a code stroke.  At that time he was noted to have acute onset right-sided weakness, sensory loss and aphasia.  CT of the head revealed an enlarging left thalamic capsular intraparenchymal hemorrhage with increasing intraventricular hemorrhage from left ventricle into the third and fourth ventricles.  Neurosurgery consulted but no indication at that time for EVD.  Urine drug screen was found to be positive for cocaine.  Since admission hospital course has been complicated by pneumonia and recurrent blood loss anemia.  It has been noted that in the outpatient setting he had undergone EGD and colonoscopy on 8/2 at [facility] in [place].  No significant abnormalities identified and patient was recommended to continue previous iron.  As of 11/30 communication in regards to global aphasia is  improving.  He also has moderate to severe dysphagia and is requiring feedings via a PEG tube.  Subjective: Awakened.  Initial speech rate conversation at hand but later perseverated on the word breakfast  Objective: Vitals:   07/08/20 0730 07/08/20 1118  BP: (!) 102/54 130/63  Pulse: 88 97  Resp: 16 18  Temp: 98.1 F (36.7 C) 98.2 F (36.8 C)  SpO2: 98% 100%    Intake/Output Summary (Last 24 hours) at 07/08/2020 1257 Last data filed at 07/08/2020 0600 Gross per 24 hour  Intake 840 ml  Output -  Net 840 ml   Filed Weights   07/03/20 0549 07/04/20 0500 07/08/20 0500  Weight: 67.3 kg 46.3 kg 50.8 kg    Exam:  Constitutional: NAD, calm Respiratory: Lungs clear to auscultation, RA-normal respiratory effort Cardiovascular: Regular rate and rhythm, adequate capillary refill, no peripheral edema. Abdomen: no tenderness, Bowel sounds positive.  PEG tube tube for feeding -dietary intake remains poor. Musculoskeletal: RUE neglect/intermittent issues with persistent flexion of RUE proved with using visual cues in front of mirror during PT session Neurologic: CN 2-12 appears to be grossly intact except for notable global aphasia with waxing and waning quality. Sensation intact, Strength 4/5 left side-dense right side hemiplegia with neglect    Psychiatric: Oriented x name and ? place.  Difficult to obtain adequate mentation/psychiatric evaluation given waxing and waning aphasia   Assessment/Plan: Acute problems: Acute Hemorrhagic stroke/cerebral edema -Etiology 2/2 severe hypertension, alcohol and cocaine use. -Neurosurgery  was consulted no indications for surgery present follow-up CT stable -Waxing and waning issues with communication secondary to global aphasia although receptive aphasia appears to be improved with expressive aphasia dominant issue. -Not on antiplatelet tx secondary to hemorrhagic etiology. LDL 80. -Continue PT and OT.   Moderate to severe dysphagia 2/2  stroke/severe protein calorie malnutrition -Secondary to stroke. PEG tube placed on 05/21/2020. Nutrition Problem: Severe Malnutrition Etiology: social / environmental circumstances Signs/Symptoms: severe fat depletion,severe muscle depletion Interventions: Tube feeding currently tolerating mechanical soft diet with thin liquids intake.  Also on nocturnal tube feedings.  Oral intake remains variable and mostly poor Estimated body mass index is 18.08 kg/m as calculated from the following:   Height as of 04/09/20: 5\' 6"  (1.676 m).   Weight as of this encounter: 50.8 kg.  LBM 11/30  Acute on chronic anemia -Total of 6 units of PRBC transfusions given in this admission current hemoglobin stable between 8 and 9. -GI consulted no indication for inpatient endoscopy noting patient status post outpatient endoscopy in August 2021:  Colonoscopy-sigmoid diverticulosis; EGD-normal esophagus, gastritis, normal duodenum and no other abnormal findings -Continue PPI twice daily. (See below regarding history of gastritis on recent EGD)  Stage II buttock decubitus Wound / Incision (Open or Dehisced) 05/21/20 Puncture Abdomen Left;Anterior;Upper G-tube insertion site  (Active)  Date First Assessed/Time First Assessed: 05/21/20 1533   Wound Type: Puncture  Location: Abdomen  Location Orientation: Left;Anterior;Upper  Wound Description (Comments): G-tube insertion site   Present on Admission: No    Assessments 05/21/2020  3:34 PM 07/07/2020 10:00 AM  Dressing Type Gauze (Comment);Tape dressing -  Dressing Changed New -  Dressing Status Clean;Dry;Intact -  Dressing Change Frequency PRN -  Site / Wound Assessment Clean;Dry -  Peri-wound Assessment Intact -  Wound Length (cm) - 0 cm  Wound Width (cm) - 0 cm  Wound Depth (cm) - 0 cm  Wound Volume (cm^3) - 0 cm^3  Wound Surface Area (cm^2) - 0 cm^2  Closure None -  Drainage Amount None -  Treatment Cleansed -     No Linked orders to display     Pressure  Injury 06/02/20 Buttocks Right Stage 2 -  Partial thickness loss of dermis presenting as a shallow open injury with a red, pink wound bed without slough. 7.5 cm in lenth by 4.5 width  (Active)  Date First Assessed/Time First Assessed: 06/02/20 0800   Location: Buttocks  Location Orientation: Right  Staging: Stage 2 -  Partial thickness loss of dermis presenting as a shallow open injury with a red, pink wound bed without slough.  Wound Descri...    Assessments 06/02/2020  8:00 AM 07/07/2020 10:00 AM  Wound Length (cm) 7.5 cm 0 cm  Wound Width (cm) 4.5 cm 0 cm  Wound Depth (cm) - 0 cm  Wound Surface Area (cm^2) 33.75 cm^2 0 cm^2  Wound Volume (cm^3) - 0 cm^3  Drainage Amount None -  Treatment Other (Comment) -     No Linked orders to display      Other problems: Goals of care -Poor neurological recovery so far.  Do not expect he will return to baseline level of functioning and likely will require long-term skilled nursing care. -Palliative care was consulted in the past. Patient remains full code.   Bibasilar pneumonia -resolved -CT scan 10/12 showed bibasilar pneumonia.  -Treated with broad-spectrum antibiotics.  -WBC normalized. No longer having fever.  Klebsiella UTI  -resolved -Urine culture report from 10/10 with more than  1000 CFU per mL of Klebsiella pneumoniae.  -Completed 5-day course of IV Rocephin.   Sinus tachycardia Essential hypertension/grade 1 diastolic dysfunction grade 1 diastolic dysfunction -Heart rate and blood pressure both are stable on Coreg 3.125 mg daily. -Echocardiogram this admission revealed preserved LVEF with physiologic findings consistent with diastolic dysfunction  Hypernatremia  - resolved   Elevated liver enzymes -Right upper quadrant ultrasound-cholelithiasis without acute cholecystitis -Chronic mild elevation of liver enzymes persist.  Tobacco Abuse -Cessation counseling this admission deferred in the context of abnormal  neurological status and patient unable to participate  Cocaine abuse -Cessation counseling this admission deferred in the context of abnormal neurological status and patient unable to participate  GERD/recent gastritis on endoscopy August 2021 -PPI twice daily  Data Reviewed: Basic Metabolic Panel: Recent Labs  Lab 07/02/20 0806  NA 138  K 3.7  CL 99  CO2 27  GLUCOSE 115*  BUN 15  CREATININE 0.58*  CALCIUM 9.7   Liver Function Tests: No results for input(s): AST, ALT, ALKPHOS, BILITOT, PROT, ALBUMIN in the last 168 hours. No results for input(s): LIPASE, AMYLASE in the last 168 hours. No results for input(s): AMMONIA in the last 168 hours. CBC: Recent Labs  Lab 07/02/20 0806  WBC 9.5  NEUTROABS 6.5  HGB 9.4*  HCT 30.4*  MCV 93.3  PLT 409*   Cardiac Enzymes: No results for input(s): CKTOTAL, CKMB, CKMBINDEX, TROPONINI in the last 168 hours. BNP (last 3 results) Recent Labs    05/29/20 0409  BNP 23.2    ProBNP (last 3 results) No results for input(s): PROBNP in the last 8760 hours.  CBG: Recent Labs  Lab 07/07/20 1936 07/07/20 2334 07/08/20 0403 07/08/20 0751 07/08/20 1118  GLUCAP 132* 142* 147* 102* 122*    No results found for this or any previous visit (from the past 240 hour(s)).   Studies: No results found.  Scheduled Meds: . carvedilol  6.25 mg Per Tube BID WC  . chlorhexidine  15 mL Mouth Rinse BID  . feeding supplement  237 mL Oral TID BM  . feeding supplement (OSMOLITE 1.5 CAL)  840 mL Per Tube Q24H  . feeding supplement (PROSource TF)  45 mL Per Tube TID  . free water  200 mL Per Tube Q4H  . insulin aspart  0-9 Units Subcutaneous Q4H  . insulin glargine  5 Units Subcutaneous Daily  . mouth rinse  15 mL Mouth Rinse q12n4p  . nutrition supplement (JUVEN)  1 packet Per Tube BID BM  . pantoprazole sodium  40 mg Per Tube BID  . sennosides  5 mL Per Tube BID  . sodium chloride flush  10-40 mL Intracatheter Q12H   Continuous  Infusions:  Principal Problem:   ICH (intracerebral hemorrhage) (HCC) Active Problems:   Polysubstance abuse (HCC)   Dysphagia due to recent cerebrovascular accident (CVA)   Hyperglycemia   Hypokalemia   Hypomagnesemia   Fever   SOB (shortness of breath)   Leukocytosis   Aspiration pneumonia of both lower lobes (HCC)   Palliative care by specialist   Goals of care, counseling/discussion   DNR (do not resuscitate) discussion   Protein-calorie malnutrition, severe   Heme positive stool   Acute lower UTI   Acute blood loss anemia   Pressure injury of skin   Consultants:  Neurology  Gastroenterology  Procedures:  Echocardiogram  Core track trach tube  PEG tube  Antibiotics: Anti-infectives (From admission, onward)   Start     Dose/Rate Route Frequency  Ordered Stop   05/28/20 0900  cefTRIAXone (ROCEPHIN) 1 g in sodium chloride 0.9 % 100 mL IVPB        1 g 200 mL/hr over 30 Minutes Intravenous Every 24 hours 05/28/20 0807 06/01/20 0855   05/21/20 1515  ceFAZolin (ANCEF) IVPB 2g/100 mL premix        2 g 200 mL/hr over 30 Minutes Intravenous To Radiology 05/21/20 1429 05/21/20 2100   05/14/20 1000  cefTRIAXone (ROCEPHIN) 2 g in sodium chloride 0.9 % 100 mL IVPB        2 g 200 mL/hr over 30 Minutes Intravenous Daily 05/13/20 1454 05/15/20 1255   05/13/20 2200  metroNIDAZOLE (FLAGYL) tablet 500 mg        500 mg Per Tube Every 8 hours 05/13/20 1847 05/15/20 2219   05/13/20 1400  metroNIDAZOLE (FLAGYL) tablet 500 mg  Status:  Discontinued        500 mg Oral Every 8 hours 05/13/20 0935 05/13/20 1847   05/12/20 0900  cefTRIAXone (ROCEPHIN) 1 g in sodium chloride 0.9 % 100 mL IVPB  Status:  Discontinued        1 g 200 mL/hr over 30 Minutes Intravenous Daily 05/12/20 0817 05/13/20 1454   05/12/20 0900  azithromycin (ZITHROMAX) 500 mg in sodium chloride 0.9 % 250 mL IVPB  Status:  Discontinued        500 mg 250 mL/hr over 60 Minutes Intravenous Daily 05/12/20 0817 05/13/20  0935   05/09/20 0930  Ampicillin-Sulbactam (UNASYN) 3 g in sodium chloride 0.9 % 100 mL IVPB  Status:  Discontinued        3 g 200 mL/hr over 30 Minutes Intravenous Every 8 hours 05/09/20 0837 05/09/20 0839   05/09/20 0930  Ampicillin-Sulbactam (UNASYN) 3 g in sodium chloride 0.9 % 100 mL IVPB  Status:  Discontinued        3 g 200 mL/hr over 30 Minutes Intravenous Every 6 hours 05/09/20 0839 05/12/20 0817       Time spent: 20 minutes    Junious Silk ANP  [facility] Pager [phone].  07/08/2020, 12:57 PM  LOS: 64 days

## 2020-07-08 NOTE — TOC Progression Note (Signed)
Transition of Care [facility]) - Progression Note    Patient Details  Name: Alan Everett MRN: [id number] Date of Birth: [birth year]  Transition of [facility]) CM/SW Contact  Kermit Balo, RN Phone Number: 07/08/2020, 1:56 PM  Clinical Narrative:    Attempted to reach girlfriend about medicaid papers without success. TOC following.   Expected Discharge Plan: Skilled Nursing Facility Barriers to Discharge: Continued Medical Work up,SNF Pending payor source - LOG,SNF Pending bed offer,SNF Pending Medicaid  Expected Discharge Plan and Services Expected Discharge Plan: Skilled Nursing Facility     Post Acute Care Choice: Skilled Nursing Facility Living arrangements for the past 2 months: Single Family Home                                       Social Determinants of Health (SDOH) Interventions    Readmission Risk Interventions No flowsheet data found.

## 2020-07-08 NOTE — Progress Notes (Signed)
  Speech Language Pathology Treatment: Dysphagia;Cognitive-Linquistic  Patient Details Name: Alan Everett MRN: [id number] DOB: [birth year] Today's Date: 07/08/2020 Time: 1432-1500 SLP Time Calculation (min) (ACUTE ONLY): 28 min  Assessment / Plan / Recommendation Clinical Impression  Continued intervention for dysphagia and cognitive linguistics. Pt upright at bedside able to generate some spontaneous speech during greeting and simple commands. Accuracy of pts auditory comprehension appears impaired, this improved with multimodal cues. Pt able to attend to ADL feeding task, however exhibits inattention to right visual field. Pt named 2/3 familiar objects in environment with cues.  Pt seen with dysphagia 1 and thin liquid lunch meal. Right sided anterior spillage without awareness exhibited as well as right buccal pocketing of puree. Thin liquid alternation via straw assisted to decrease oral residuals. No overt s/x of aspiration with current diet. Continue aspiration precautions.       HPI HPI: Alan Everett is a 64 y.o. male with a PMHx of alcohol use and tobacco abuse who presents acutely to the ED via EMS as a Code Stroke for acute onset of right sided weakness. Patient with disoriented, right leg weakness, right limb ataxia, right decreased sensation and Global aphasia on exam. 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 Worsening of intraventricular extension.  MBS 10/19 mod-severe dysphagia, continue NPO. PEG 10/22.  Repeat MBS 10/30 with improvements. Started dys1/nectar 11/2, advanced to thin liquids with D1.      SLP Plan  Continue with current plan of care       Recommendations  Diet recommendations: Thin liquid;Dysphagia 1 (puree) Liquids provided via: Cup;Straw Medication Administration: Whole meds with puree Supervision: Staff to assist with self feeding;Full supervision/cueing for compensatory strategies Compensations: Minimize environmental distractions;Monitor for  anterior loss;Small sips/bites;Slow rate;Follow solids with liquid Postural Changes and/or Swallow Maneuvers: Seated upright 90 degrees                Oral Care Recommendations: Oral care BID Follow up Recommendations: Skilled Nursing facility;Inpatient Rehab SLP Visit Diagnosis: Dysphagia, oropharyngeal phase (R13.12);Aphasia (R47.01) Plan: Continue with current plan of care       GO                Mackinsey Pelland E Bracen Schum MA, CCC-SLP Acute Rehabilitation Services  07/08/2020, 3:04 PM

## 2020-07-09 DIAGNOSIS — R1310 Dysphagia, unspecified: Secondary | ICD-10-CM

## 2020-07-09 DIAGNOSIS — I69320 Aphasia following cerebral infarction: Secondary | ICD-10-CM

## 2020-07-09 LAB — GLUCOSE, CAPILLARY
Glucose-Capillary: 141 mg/dL — ABNORMAL HIGH (ref 70–99)
Glucose-Capillary: 106 mg/dL — ABNORMAL HIGH (ref 70–99)
Glucose-Capillary: 135 mg/dL — ABNORMAL HIGH (ref 70–99)
Glucose-Capillary: 107 mg/dL — ABNORMAL HIGH (ref 70–99)
Glucose-Capillary: 116 mg/dL — ABNORMAL HIGH (ref 70–99)
Glucose-Capillary: 125 mg/dL — ABNORMAL HIGH (ref 70–99)

## 2020-07-09 NOTE — Progress Notes (Signed)
Occupational Therapy Treatment Patient Details Name: Alan Everett MRN: [id number] DOB: June 22, 1956 Today's Date: 07/09/2020    History of present illness Patient is a 64 y/o male with PMH ETOH use, tobacco abuse, admitted with R side weakness,sensory loss and speech deficits.  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   OT comments  Pt with decreased tolerance for extended posture on R side the body noted with attempts to complete peri care. Pt incontinence of bowel and no awareness. Pt with side lying PROM stretching provided this session after discovery of tone and posture. OT/PT session of transfers deferred to work on ROM. Pt has R hip pain as the majority area of greatest discomfort. Pt with hip abducted slightly flexed and knee flexion. Pt with pillow placed at hip to passively cause adduction and tolerating. Recommendation for SNF at this time.   Follow Up Recommendations  Supervision/Assistance - 24 hour;SNF    Equipment Recommendations  Wheelchair (measurements OT);Wheelchair cushion (measurements OT);Hospital bed;Other (comment) (hoyer)    Recommendations for Other Services      Precautions / Restrictions Precautions Precautions: Fall Precaution Comments: G tube, right neglect, prevalon boot Required Braces or Orthoses: Splint/Cast Splint/Cast: soft pillow splint R UE at night Restrictions Weight Bearing Restrictions: No       Mobility Bed Mobility Overal bed mobility: Needs Assistance Bed Mobility: Rolling Rolling: Mod assist;Max assist         General bed mobility comments: Cued pt to utilize bed rails to assist with pulling to roll trunk but pt tends to push with L arm on R bed rail instead. ModA to roll to R but maxA to roll to L due to pain from tightness on R side.  Transfers                 General transfer comment: Deferred to focus on stretching this date    Balance       Sitting  balance - Comments: Deferred to focus on stretching this date       Standing balance comment: Deferred to focus on stretching this date                           ADL either performed or assessed with clinical judgement   ADL                                         General ADL Comments: only few bites of a lunch noted on arrival. pt requires max (A) for all adls this sesion. pt incontinence of bowel and reports he is not soiled. pt lacks awareness for need for hygiene.     Vision       Perception     Praxis      Cognition Arousal/Alertness: Awake/alert Behavior During Therapy: WFL for tasks assessed/performed Overall Cognitive Status: Impaired/Different from baseline Area of Impairment: Safety/judgement;Awareness;Problem solving;Following commands;Memory;Attention                   Current Attention Level: Sustained Memory: Decreased short-term memory Following Commands: Follows one step commands inconsistently;Follows one step commands with increased time Safety/Judgement: Decreased awareness of safety;Decreased awareness of deficits Awareness: Emergent Problem Solving: Slow processing;Requires verbal cues;Requires tactile cues;Difficulty sequencing;Decreased initiation General Comments: Repeated cues to assist therapists with passive stretching and bed mob but pt displays  difficulty following commands. Educated pt on necessity to stretch his R side and lie on his L in bed to make him more aware of his deficits. Pt able to recognize that R side is painful source showing more awareness to R side        Exercises Exercises: Other exercises General Exercises - Upper Extremity Shoulder Extension: PROM;Right;15 reps;Supine Shoulder ABduction: Right;10 reps;Supine Shoulder ADduction: PROM;10 reps;Right Elbow Flexion: PROM;15 reps;Supine Elbow Extension: PROM;Right;10 reps;Supine Digit Composite Flexion: PROM;10 reps;Right Other  Exercises Other Exercises: Pt with knee flexion knees adducted to help with segmental trunk lateral movement with increased tolerance toward R and minimal tolerance with movement to the L side past midline Other Exercises: see PT note for LB extensive stretching noted Other Exercises: pt requires (A) of second therapist to distract patient and prevent pushign with L side for increased participation of R side stretching and positioning Other Exercises: PROM R hip adduction through cuing pt to bring knees together sidelying on L or while rocking hips back and forth supine while blocking R ASIS   Shoulder Instructions       General Comments pt high risk for skin break down due to decr mobility. pt pushing with L UE causing side lying positioning    Pertinent Vitals/ Pain       Pain Assessment: Faces Faces Pain Scale: Hurts whole lot Pain Location: R limbs with stretching Pain Descriptors / Indicators: Grimacing;Guarding;Moaning;Sharp Pain Intervention(s): Limited activity within patient's tolerance;Repositioned  Home Living                                          Prior Functioning/Environment              Frequency  Min 2X/week        Progress Toward Goals  OT Goals(current goals can now be found in the care plan section)  Progress towards OT goals: Progressing toward goals  Acute Rehab OT Goals Patient Stated Goal: to get better OT Goal Formulation: Patient unable to participate in goal setting Time For Goal Achievement: 07/23/20 Potential to Achieve Goals: Good ADL Goals Pt Will Perform Grooming: sitting;with min guard assist Pt Will Transfer to Toilet: with max assist;with transfer board;bedside commode Pt/caregiver will Perform Home Exercise Program: Right Upper extremity;With written HEP provided Additional ADL Goal #1: pt will tolerate PROM stretch R UE for 5 minutes Additional ADL Goal #2: pt will tolerate R elbow soft pillow splint for 4  hours Additional ADL Goal #3: Pt will follow 2 step command  3 out of 5 one step commands Additional ADL Goal #4: pt will sit EOB min guard (A) as prep for transfers  Plan Discharge plan remains appropriate    Co-evaluation    PT/OT/SLP Co-Evaluation/Treatment: Yes Reason for Co-Treatment: Complexity of the patient's impairments (multi-system involvement);For patient/therapist safety   OT goals addressed during session: ADL's and self-care;Proper use of Adaptive equipment and DME;Strengthening/ROM      AM-PAC OT "6 Clicks" Daily Activity     Outcome Measure   Help from another person eating meals?: A Lot Help from another person taking care of personal grooming?: A Lot Help from another person toileting, which includes using toliet, bedpan, or urinal?: Total Help from another person bathing (including washing, rinsing, drying)?: Total Help from another person to put on and taking off regular upper body clothing?: Total Help from another person to put  on and taking off regular lower body clothing?: Total 6 Click Score: 8    End of Session    OT Visit Diagnosis: Unsteadiness on feet (R26.81);Muscle weakness (generalized) (M62.81) Hemiplegia - Right/Left: Right Hemiplegia - dominant/non-dominant: Dominant   Activity Tolerance Patient tolerated treatment well   Patient Left with call bell/phone within reach;with bed alarm set;in bed   Nurse Communication Precautions        Time: 5993-5701 OT Time Calculation (min): 45 min  Charges: OT General Charges $OT Visit: 1 Visit OT Treatments $Self Care/Home Management : 23-37 mins   Brynn, OTR/L  Acute Rehabilitation Services Pager: [phone] Office: [phone] .    Mateo Flow 07/09/2020, 4:36 PM

## 2020-07-09 NOTE — Progress Notes (Signed)
[facility] PROGRESS NOTE  Zachariah Pavek FGH:[id number] DOB: October 31, 1955 DOA: 05/05/2020 PCP: Patient, No Pcp Per    11/16   Status:  Remains inpatient appropriate because:Altered mental status and Unsafe d/c plan  Dispo:  The patient is from: Home              Anticipated d/c is to: SNF vs home with family and home health services              Anticipated d/c date is: > 3 days              Patient currently is medically stable to d/c.  No expectation of full recovery from neurological injury with recommendation for long-term care at skilled nursing facility. Barriers to dx: Needs funding for SNF bed, no offers thus far    Code Status: Full Family Communication: Spoke with daughter yesterday 12/6 by phone DVT prophylaxis: SCDs only-presented with intraventricular hemorrhage resulting in neurological injury Vaccination status: We will need to discuss with family if patient has previously received Covid vaccination  Foley catheter: No  HPI: 64 year old male patient with history of iron deficiency anemia, alcohol use, cocaine use and tobacco abuse.  Patient presented to the ER by EMS on 10/6 as a code stroke.  At that time he was noted to have acute onset right-sided weakness, sensory loss and aphasia.  CT of the head revealed an enlarging left thalamic capsular intraparenchymal hemorrhage with increasing intraventricular hemorrhage from left ventricle into the third and fourth ventricles.  Neurosurgery consulted but no indication at that time for EVD.  Urine drug screen was found to be positive for cocaine.  Since admission hospital course has been complicated by pneumonia and recurrent blood loss anemia.  It has been noted that in the outpatient setting he had undergone EGD and colonoscopy on 8/2 at [facility] in [place].  No significant abnormalities identified and patient was recommended to continue previous iron.  As of 11/30 communication in regards to global aphasia is  improving.  He also has moderate to severe dysphagia and is requiring feedings via a PEG tube.  Subjective: Awake.  Primarily having unintelligible speech with a few appropriate words.  When engaged in conversation responses are not consistent with conversation at hand.  Objective: Vitals:   07/09/20 0407 07/09/20 0800  BP: 132/76 124/70  Pulse: 96 98  Resp: 18 17  Temp: 99 F (37.2 C)   SpO2: 100% 100%    Intake/Output Summary (Last 24 hours) at 07/09/2020 1221 Last data filed at 07/09/2020 6789 Gross per 24 hour  Intake 2643.5 ml  Output --  Net 2643.5 ml   Filed Weights   07/03/20 0549 07/04/20 0500 07/08/20 0500  Weight: 67.3 kg 46.3 kg 50.8 kg    Exam:  Constitutional: NAD, calm Respiratory: Lungs clear to auscultation, RA-normal respiratory effort Cardiovascular: Regular rate and rhythm, adequate capillary refill, no peripheral edema. Abdomen: no tenderness, Bowel sounds positive.  PEG tube tube for feeding -dietary intake remains poor. LBM 12/9 Musculoskeletal: RUE neglect/intermittent issues with persistent flexion of RUE  Neurologic: CN 2-12 appears to be grossly intact except for notable global aphasia with waxing and waning quality. Sensation intact, Strength 4/5 left side-dense right side hemiplegia with neglect    Psychiatric: Oriented x name.  Difficult to obtain adequate mentation/psychiatric evaluation given waxing and waning aphasia   Assessment/Plan: Acute problems: Acute Hemorrhagic stroke/cerebral edema -Etiology 2/2 severe hypertension, alcohol and cocaine use. -Neurosurgery was consulted no indications for surgery present  follow-up CT stable -Waxing and waning issues with communication secondary to global aphasia although receptive aphasia appears to be improved with expressive aphasia dominant issue. -Not on antiplatelet tx secondary to hemorrhagic etiology. LDL 80. -Continue PT and OT.   Moderate to severe dysphagia 2/2 stroke/severe protein  calorie malnutrition -Secondary to stroke. PEG tube placed on 05/21/2020. Nutrition Problem: Severe Malnutrition Etiology: social / environmental circumstances Signs/Symptoms: severe fat depletion,severe muscle depletion Interventions: Tube feeding currently tolerating mechanical soft diet with thin liquids intake.  Also on nocturnal tube feedings.  Oral intake remains variable and mostly poor Estimated body mass index is 18.08 kg/m as calculated from the following:   Height as of 04/09/20: 5\' 6"  (1.676 m).   Weight as of this encounter: 50.8 kg.  LBM 11/30  Acute on chronic anemia -Total of 6 units of PRBC transfusions given in this admission current hemoglobin stable between 8 and 9. -GI consulted no indication for inpatient endoscopy noting patient status post outpatient endoscopy in August 2021:  Colonoscopy-sigmoid diverticulosis; EGD-normal esophagus, gastritis, normal duodenum and no other abnormal findings -Continue PPI twice daily. (See below regarding history of gastritis on recent EGD)  Stage II buttock decubitus Wound / Incision (Open or Dehisced) 05/21/20 Puncture Abdomen Left;Anterior;Upper G-tube insertion site  (Active)  Date First Assessed/Time First Assessed: 05/21/20 1533   Wound Type: Puncture  Location: Abdomen  Location Orientation: Left;Anterior;Upper  Wound Description (Comments): G-tube insertion site   Present on Admission: No    Assessments 05/21/2020  3:34 PM 07/07/2020 10:00 AM  Dressing Type Gauze (Comment);Tape dressing --  Dressing Changed New --  Dressing Status Clean;Dry;Intact --  Dressing Change Frequency PRN --  Site / Wound Assessment Clean;Dry --  Peri-wound Assessment Intact --  Wound Length (cm) -- 0 cm  Wound Width (cm) -- 0 cm  Wound Depth (cm) -- 0 cm  Wound Volume (cm^3) -- 0 cm^3  Wound Surface Area (cm^2) -- 0 cm^2  Closure None --  Drainage Amount None --  Treatment Cleansed --     No Linked orders to display     Pressure Injury  06/02/20 Buttocks Right Stage 2 -  Partial thickness loss of dermis presenting as a shallow open injury with a red, pink wound bed without slough. 7.5 cm in lenth by 4.5 width  (Active)  Date First Assessed/Time First Assessed: 06/02/20 0800   Location: Buttocks  Location Orientation: Right  Staging: Stage 2 -  Partial thickness loss of dermis presenting as a shallow open injury with a red, pink wound bed without slough.  Wound Descri...    Assessments 06/02/2020  8:00 AM 07/07/2020 10:00 AM  Wound Length (cm) 7.5 cm 0 cm  Wound Width (cm) 4.5 cm 0 cm  Wound Depth (cm) -- 0 cm  Wound Surface Area (cm^2) 33.75 cm^2 0 cm^2  Wound Volume (cm^3) -- 0 cm^3  Drainage Amount None --  Treatment Other (Comment) --     No Linked orders to display      Other problems: Goals of care -Poor neurological recovery so far.  Do not expect he will return to baseline level of functioning and likely will require long-term skilled nursing care. -Palliative care was consulted in the past. Patient remains full code.   Bibasilar pneumonia -resolved -CT scan 10/12 showed bibasilar pneumonia.  -Treated with broad-spectrum antibiotics.  -WBC normalized. No longer having fever.  Klebsiella UTI  -resolved -Urine culture report from 10/10 with more than 1000 CFU per mL of Klebsiella pneumoniae.  -  Completed 5-day course of IV Rocephin.   Sinus tachycardia Essential hypertension/grade 1 diastolic dysfunction grade 1 diastolic dysfunction -Heart rate and blood pressure both are stable on Coreg 3.125 mg daily. -Echocardiogram this admission revealed preserved LVEF with physiologic findings consistent with diastolic dysfunction  Hypernatremia  - resolved   Elevated liver enzymes -Right upper quadrant ultrasound-cholelithiasis without acute cholecystitis -Chronic mild elevation of liver enzymes persist.  Tobacco Abuse -Cessation counseling this admission deferred in the context of abnormal  neurological status and patient unable to participate  Cocaine abuse -Cessation counseling this admission deferred in the context of abnormal neurological status and patient unable to participate  GERD/recent gastritis on endoscopy August 2021 -PPI twice daily  Data Reviewed: Basic Metabolic Panel: No results for input(s): NA, K, CL, CO2, GLUCOSE, BUN, CREATININE, CALCIUM, MG, PHOS in the last 168 hours. Liver Function Tests: No results for input(s): AST, ALT, ALKPHOS, BILITOT, PROT, ALBUMIN in the last 168 hours. No results for input(s): LIPASE, AMYLASE in the last 168 hours. No results for input(s): AMMONIA in the last 168 hours. CBC: No results for input(s): WBC, NEUTROABS, HGB, HCT, MCV, PLT in the last 168 hours. Cardiac Enzymes: No results for input(s): CKTOTAL, CKMB, CKMBINDEX, TROPONINI in the last 168 hours. BNP (last 3 results) Recent Labs    05/29/20 0409  BNP 23.2    ProBNP (last 3 results) No results for input(s): PROBNP in the last 8760 hours.  CBG: Recent Labs  Lab 07/08/20 1552 07/08/20 1951 07/08/20 2349 07/09/20 0323 07/09/20 0907  GLUCAP 136* 114* 134* 141* 106*    No results found for this or any previous visit (from the past 240 hour(s)).   Studies: No results found.  Scheduled Meds: . carvedilol  6.25 mg Per Tube BID WC  . chlorhexidine  15 mL Mouth Rinse BID  . feeding supplement  237 mL Oral TID BM  . feeding supplement (OSMOLITE 1.5 CAL)  840 mL Per Tube Q24H  . feeding supplement (PROSource TF)  45 mL Per Tube TID  . free water  200 mL Per Tube Q4H  . insulin aspart  0-9 Units Subcutaneous Q4H  . insulin glargine  5 Units Subcutaneous Daily  . mouth rinse  15 mL Mouth Rinse q12n4p  . nutrition supplement (JUVEN)  1 packet Per Tube BID BM  . pantoprazole sodium  40 mg Per Tube BID  . sennosides  5 mL Per Tube BID  . sodium chloride flush  10-40 mL Intracatheter Q12H   Continuous Infusions:  Principal Problem:   ICH  (intracerebral hemorrhage) (HCC) Active Problems:   Polysubstance abuse (HCC)   Dysphagia due to recent cerebrovascular accident (CVA)   Hyperglycemia   Hypokalemia   Hypomagnesemia   Fever   SOB (shortness of breath)   Leukocytosis   Aspiration pneumonia of both lower lobes (HCC)   Palliative care by specialist   Goals of care, counseling/discussion   DNR (do not resuscitate) discussion   Protein-calorie malnutrition, severe   Heme positive stool   Acute lower UTI   Acute blood loss anemia   Pressure injury of skin   Consultants:  Neurology  Gastroenterology  Procedures:  Echocardiogram  Core track trach tube  PEG tube  Antibiotics: Anti-infectives (From admission, onward)   Start     Dose/Rate Route Frequency Ordered Stop   05/28/20 0900  cefTRIAXone (ROCEPHIN) 1 g in sodium chloride 0.9 % 100 mL IVPB        1 g 200 mL/hr over 30  Minutes Intravenous Every 24 hours 05/28/20 0807 06/01/20 0855   05/21/20 1515  ceFAZolin (ANCEF) IVPB 2g/100 mL premix        2 g 200 mL/hr over 30 Minutes Intravenous To Radiology 05/21/20 1429 05/21/20 2100   05/14/20 1000  cefTRIAXone (ROCEPHIN) 2 g in sodium chloride 0.9 % 100 mL IVPB        2 g 200 mL/hr over 30 Minutes Intravenous Daily 05/13/20 1454 05/15/20 1255   05/13/20 2200  metroNIDAZOLE (FLAGYL) tablet 500 mg        500 mg Per Tube Every 8 hours 05/13/20 1847 05/15/20 2219   05/13/20 1400  metroNIDAZOLE (FLAGYL) tablet 500 mg  Status:  Discontinued        500 mg Oral Every 8 hours 05/13/20 0935 05/13/20 1847   05/12/20 0900  cefTRIAXone (ROCEPHIN) 1 g in sodium chloride 0.9 % 100 mL IVPB  Status:  Discontinued        1 g 200 mL/hr over 30 Minutes Intravenous Daily 05/12/20 0817 05/13/20 1454   05/12/20 0900  azithromycin (ZITHROMAX) 500 mg in sodium chloride 0.9 % 250 mL IVPB  Status:  Discontinued        500 mg 250 mL/hr over 60 Minutes Intravenous Daily 05/12/20 0817 05/13/20 0935   05/09/20 0930   Ampicillin-Sulbactam (UNASYN) 3 g in sodium chloride 0.9 % 100 mL IVPB  Status:  Discontinued        3 g 200 mL/hr over 30 Minutes Intravenous Every 8 hours 05/09/20 0837 05/09/20 0839   05/09/20 0930  Ampicillin-Sulbactam (UNASYN) 3 g in sodium chloride 0.9 % 100 mL IVPB  Status:  Discontinued        3 g 200 mL/hr over 30 Minutes Intravenous Every 6 hours 05/09/20 0839 05/12/20 0817       Time spent: 20 minutes    Junious Silkllison Velisa Regnier ANP  [facility] Pager [phone].  07/09/2020, 12:21 PM  LOS: 65 days

## 2020-07-09 NOTE — Progress Notes (Signed)
Physical Therapy Treatment Patient Details Name: Alan Everett MRN: [id number] DOB: [birth year] Today's Date: 07/09/2020    History of Present Illness Patient is a 64 y/o male with PMH ETOH use, tobacco abuse, admitted with R side weakness,sensory loss and speech deficits.  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    PT Comments    Initially prepared session to focus on functional mobility, balance, and standing with multiple devices while treating pt in conjunction with OT to maximize pt safety and quality of session. However, upon arrival pt displayed increased tightness in his R sided limbs that significantly limit his ability to perform all functional mobility, especially standing due to his strong flexor tone in his R leg. Therefore, focused session rather on passive stretching his R side. His R hip appears to be his primary source of discomfort, tightness, and pain as he tends to tolerate R knee extension better when his hip is flexed rather than extended. He displays significant tightness in his R hip flexors, gastrocs, and hip abductors and some tightness in his hamstrings. Provided moist heat to his R knee along with soft tissue mobilization to these tight muscles to encourage relaxation and pain management. Positioned pt at end of session with pillows wedged under R hip to encourage hip adduction. Will continue to follow acutely. Current recommendations remain appropriate.  Follow Up Recommendations  SNF     Equipment Recommendations  Wheelchair (measurements PT);Wheelchair cushion (measurements PT);Hospital bed;3in1 (PT);Other (comment)    Recommendations for Other Services       Precautions / Restrictions Precautions Precautions: Fall Precaution Comments: G tube, right neglect, prevalon boot Required Braces or Orthoses: Splint/Cast Splint/Cast: soft pillow splint R UE at night Restrictions Weight Bearing  Restrictions: No    Mobility  Bed Mobility Overal bed mobility: Needs Assistance Bed Mobility: Rolling Rolling: Mod assist;Max assist         General bed mobility comments: Cued pt to utilize bed rails to assist with pulling to roll trunk but pt tends to push with L arm on R bed rail instead. ModA to roll to R but maxA to roll to L due to pain from tightness on R side.  Transfers                 General transfer comment: Deferred to focus on stretching this date  Ambulation/Gait             General Gait Details: Deferred   Stairs             Wheelchair Mobility    Modified Rankin (Stroke Patients Only) Modified Rankin (Stroke Patients Only) Pre-Morbid Rankin Score: No symptoms Modified Rankin: Severe disability     Balance       Sitting balance - Comments: Deferred to focus on stretching this date       Standing balance comment: Deferred to focus on stretching this date                            Cognition Arousal/Alertness: Awake/alert Behavior During Therapy: WFL for tasks assessed/performed Overall Cognitive Status: Impaired/Different from baseline Area of Impairment: Safety/judgement;Awareness;Problem solving;Following commands;Memory;Attention                   Current Attention Level: Sustained Memory: Decreased short-term memory Following Commands: Follows one step commands inconsistently;Follows one step commands with increased time Safety/Judgement: Decreased awareness of safety;Decreased awareness of deficits  Awareness: Emergent Problem Solving: Slow processing;Requires verbal cues;Requires tactile cues;Difficulty sequencing;Decreased initiation General Comments: Repeated cues to assist therapists with passive stretching and bed mob but pt displays difficulty following commands. Educated pt on necessity to stretch his R side and lie on his L in bed to make him more aware of his deficits      Exercises Other  Exercises Other Exercises: PROM to R hip into extension through the following methods: attempted roll to half-prone by going to R or L but pt resisted, sidelying stretch but resistance and difficulty blocking compensation, stretching in supine with more success ~5-6x ~20-40 sec each Other Exercises: PROM R knee extension ~8x ~20-45 sec each (sometimes combo with ankle dorsiflexion or hip extension) Other Exercises: PROM ankle dorsiflexion 3x ~30 sec Other Exercises: PROM R hip adduction through cuing pt to bring knees together sidelying on L or while rocking hips back and forth supine while blocking R ASIS    General Comments        Pertinent Vitals/Pain Pain Assessment: Faces Faces Pain Scale: Hurts whole lot Pain Location: R limbs with stretching Pain Descriptors / Indicators: Grimacing;Guarding;Moaning;Sharp Pain Intervention(s): Limited activity within patient's tolerance;Monitored during session;Repositioned;Heat applied (moist heat applied during)    Home Living                      Prior Function            PT Goals (current goals can now be found in the care plan section) Acute Rehab PT Goals Patient Stated Goal: to get better PT Goal Formulation: Patient unable to participate in goal setting Time For Goal Achievement: 07/16/20 Potential to Achieve Goals: Fair Progress towards PT goals: Progressing toward goals    Frequency    Min 3X/week      PT Plan Current plan remains appropriate    Co-evaluation PT/OT/SLP Co-Evaluation/Treatment: Yes Reason for Co-Treatment: Complexity of the patient's impairments (multi-system involvement);To address functional/ADL transfers;For patient/therapist safety          AM-PAC PT "6 Clicks" Mobility   Outcome Measure  Help needed turning from your back to your side while in a flat bed without using bedrails?: A Lot Help needed moving from lying on your back to sitting on the side of a flat bed without using  bedrails?: A Lot Help needed moving to and from a bed to a chair (including a wheelchair)?: Total Help needed standing up from a chair using your arms (e.g., wheelchair or bedside chair)?: A Lot Help needed to walk in hospital room?: Total Help needed climbing 3-5 steps with a railing? : Total 6 Click Score: 9    End of Session   Activity Tolerance: Patient limited by pain Patient left: in bed;with call bell/phone within reach;with bed alarm set   PT Visit Diagnosis: Unsteadiness on feet (R26.81);Muscle weakness (generalized) (M62.81);Difficulty in walking, not elsewhere classified (R26.2);Other symptoms and signs involving the nervous system (R29.898);Hemiplegia and hemiparesis Hemiplegia - Right/Left: Right Hemiplegia - dominant/non-dominant: Dominant Hemiplegia - caused by: Nontraumatic intracerebral hemorrhage     Time: 5170-0174 PT Time Calculation (min) (ACUTE ONLY): 43 min  Charges:  $Therapeutic Exercise: 8-22 mins                     Raymond Gurney, PT, DPT Acute Rehabilitation Services  Pager: [phone] Office: [phone]    Jewel Baize 07/09/2020, 3:53 PM

## 2020-07-09 NOTE — [place] FL2 (Signed)
[place] MEDICAID FL2 LEVEL OF CARE SCREENING TOOL     IDENTIFICATION  Patient Name: Alan Everett Birthdate: April 05, 1956 Sex: male Admission Date (Current Location): 05/05/2020  [facility] and [place] Number:  Chiropodist and Address:  [facility], [address] [place], [place] [address]      Provider Number: [id number]  Attending Physician Name and Address:  Standley Brooking, MD  Relative Name and Phone Number:       Current Level of Care: Hospital Recommended Level of Care: Skilled Nursing Facility Prior Approval Number:    Date Approved/Denied:   PASRR Number: [id number]  Discharge Plan: SNF    Current Diagnoses: Patient Active Problem List   Diagnosis Date Noted  . Aphasia as late effect of cerebrovascular accident   . Pressure injury of skin 06/02/2020  . Acute lower UTI 05/29/2020  . Acute blood loss anemia 05/29/2020  . Heme positive stool   . Protein-calorie malnutrition, severe 05/21/2020  . Palliative care by specialist   . Goals of care, counseling/discussion   . DNR (do not resuscitate) discussion   . Leukocytosis   . Aspiration pneumonia of both lower lobes (HCC)   . Fever   . SOB (shortness of breath)   . Polysubstance abuse (HCC)   . Dysphagia due to recent cerebrovascular accident (CVA)   . Hyperglycemia   . Hypokalemia   . Hypomagnesemia   . ICH (intracerebral hemorrhage) (HCC) 05/05/2020  . Iron deficiency 02/29/2020  . Symptomatic anemia 02/28/2020  . Microcytic anemia 02/28/2020  . Alcohol use 02/28/2020  . Tobacco abuse     Orientation RESPIRATION BLADDER Height & Weight        Normal Incontinent Weight: 112 lb (50.8 kg) Height:     BEHAVIORAL SYMPTOMS/MOOD NEUROLOGICAL BOWEL NUTRITION STATUS      Incontinent Diet,Feeding tube (dysphagia 1 with nectar thick liquids// G tube--Osmolite 1.5 65 cc/ hour from 1800-0600)  AMBULATORY STATUS COMMUNICATION OF NEEDS Skin   Total Care Verbally Skin  abrasions (abrasion to rt buttock)                       Personal Care Assistance Level of Assistance  Bathing,Feeding,Dressing Bathing Assistance: Maximum assistance Feeding assistance: Maximum assistance Dressing Assistance: Maximum assistance     Functional Limitations Info  Sight,Hearing,Speech Sight Info: Impaired Hearing Info: Adequate Speech Info: Impaired    SPECIAL CARE FACTORS FREQUENCY  PT (By licensed PT),OT (By licensed OT),Speech therapy     PT Frequency: 5x/wk OT Frequency: 5x/wk     Speech Therapy Frequency: 5x/wk      Contractures Contractures Info: Not present    Additional Factors Info  Suctioning Needs,Code Status,Allergies,Insulin Sliding Scale Code Status Info: Full Allergies Info: NKA   Insulin Sliding Scale Info: Novolog 0-9 units SQ every 4 hours   Suctioning Needs: oral as needed   Current Medications (07/09/2020):  This is the current hospital active medication list Current Facility-Administered Medications  Medication Dose Route Frequency Provider Last Rate Last Admin  . acetaminophen (TYLENOL) tablet 650 mg  650 mg Per Tube Q4H PRN Royce Macadamia, RPH       Or  . acetaminophen (TYLENOL) 160 MG/5ML solution 650 mg  650 mg Per Tube Q4H PRN Royce Macadamia, RPH   650 mg at 07/07/20 2200   Or  . acetaminophen (TYLENOL) suppository 650 mg  650 mg Rectal Q4H PRN Royce Macadamia, RPH      .  carvedilol (COREG) tablet 6.25 mg  6.25 mg Per Tube BID WC Russella Dar, NP   6.25 mg at 07/09/20 9983  . chlorhexidine (PERIDEX) 0.12 % solution 15 mL  15 mL Mouth Rinse BID Marvel Plan, MD   15 mL at 07/09/20 0923  . feeding supplement (ENSURE ENLIVE / ENSURE PLUS) liquid 237 mL  237 mL Oral TID BM Vann, Khya Halls U, DO   237 mL at 07/09/20 1300  . feeding supplement (OSMOLITE 1.5 CAL) liquid 840 mL  840 mL Per Tube Q24H Vann, Mayo Owczarzak U, DO   Stopped at 07/09/20 0700  . feeding supplement (PROSource TF) liquid 45 mL  45 mL Per Tube TID  [name] U, DO   45 mL at 07/09/20 3825  . free water 200 mL  200 mL Per Tube Q4H Amin, Ankit Chirag, MD   200 mL at 07/09/20 1230  . insulin aspart (novoLOG) injection 0-9 Units  0-9 Units Subcutaneous Q4H Shalhoub, Deno Lunger, MD   1 Units at 07/09/20 1259  . insulin glargine (LANTUS) injection 5 Units  5 Units Subcutaneous Daily Alessandra Bevels, MD   5 Units at 07/09/20 [phone]  . levalbuterol (XOPENEX) nebulizer solution 0.63 mg  0.63 mg Nebulization Q6H PRN Hughie Closs, MD      . MEDLINE mouth rinse  15 mL Mouth Rinse q12n4p Marvel Plan, MD   15 mL at 07/09/20 1230  . nutrition supplement (JUVEN) (JUVEN) powder packet 1 packet  1 packet Per Tube BID BM [name] U, DO   1 packet at 07/09/20 1236  . pantoprazole sodium (PROTONIX) 40 mg/20 mL oral suspension 40 mg  40 mg Per Tube BID Russella Dar, NP   40 mg at 07/09/20 0923  . sennosides (SENOKOT) 8.8 MG/5ML syrup 5 mL  5 mL Per Tube BID Lorin Glass, MD   5 mL at 07/09/20 0922  . sodium chloride flush (NS) 0.9 % injection 10-40 mL  10-40 mL Intracatheter Q12H Annie Main L, NP   10 mL at 07/09/20 0927  . sodium chloride flush (NS) 0.9 % injection 10-40 mL  10-40 mL Intracatheter PRN Layne Benton, NP   10 mL at 06/10/20 1110     Discharge Medications: Please see discharge summary for a list of discharge medications.  Relevant Imaging Results:  Relevant Lab Results:   Additional Information SS#: [id number]--[id number] be LOG with disability pending/ will need palliative to follow at facility  [facility], [facility]

## 2020-07-09 NOTE — TOC Progression Note (Addendum)
Transition of Care [facility]) - Progression Note    Patient Details  Name: Alan Everett MRN: [id number] Date of Birth: 04-Jan-1956  [facility]) CM/SW Contact  Carley Hammed, [place] Phone Number: 07/09/2020, 1:57 PM  Clinical Narrative:     CSW received info from supervisor that [facility] may be interested in this pt. Referral faxed ( [phone]) and [facility] contacted [phone]). She will review and follow up with decision. SW will continue to follow.  3:45 pm Stephanie from [facility] called and noted that they would like to take him, but needed a few more things. She wanted to know about his suction needs, according to the nurse he hasn't needed suctioned. She also requested 3 months of bank statements. CSW attempted to call both contacts in chart and was unable to leave messages. CSW will continue to follow.  Expected Discharge Plan: Skilled Nursing Facility Barriers to Discharge: Continued Medical Work up,SNF Pending payor source - LOG,SNF Pending bed offer,SNF Pending Medicaid  Expected Discharge Plan and Services Expected Discharge Plan: Skilled Nursing Facility     Post Acute Care Choice: Skilled Nursing Facility Living arrangements for the past 2 months: Single Family Home                                       Social Determinants of Health (SDOH) Interventions    Readmission Risk Interventions No flowsheet data found.

## 2020-07-10 LAB — BASIC METABOLIC PANEL
Anion gap: 8 (ref 5–15)
BUN: 19 mg/dL (ref 8–23)
CO2: 24 mmol/L (ref 22–32)
Calcium: 9.1 mg/dL (ref 8.9–10.3)
Chloride: 102 mmol/L (ref 98–111)
Creatinine, Ser: 0.49 mg/dL — ABNORMAL LOW (ref 0.61–1.24)
GFR, Estimated: >60 mL/min (ref >60)
Glucose, Bld: 136 mg/dL — ABNORMAL HIGH (ref 70–99)
Potassium: 3.3 mmol/L — ABNORMAL LOW (ref 3.5–5.1)
Sodium: 134 mmol/L — ABNORMAL LOW (ref 135–145)

## 2020-07-10 LAB — CBC
HCT: 27.4 % — ABNORMAL LOW (ref 39.0–52.0)
Hemoglobin: 8.6 g/dL — ABNORMAL LOW (ref 13.0–17.0)
MCH: 28.1 pg (ref 26.0–34.0)
MCHC: 31.4 g/dL (ref 30.0–36.0)
MCV: 89.5 fL (ref 80.0–100.0)
Platelets: 331 10*3/uL (ref 150–400)
RBC: 3.06 MIL/uL — ABNORMAL LOW (ref 4.22–5.81)
RDW: 16.9 % — ABNORMAL HIGH (ref 11.5–15.5)
WBC: 7.3 10*3/uL (ref 4.0–10.5)
nRBC: 0 % (ref 0.0–0.2)

## 2020-07-10 LAB — GLUCOSE, CAPILLARY
Glucose-Capillary: 112 mg/dL — ABNORMAL HIGH (ref 70–99)
Glucose-Capillary: 113 mg/dL — ABNORMAL HIGH (ref 70–99)
Glucose-Capillary: 124 mg/dL — ABNORMAL HIGH (ref 70–99)
Glucose-Capillary: 130 mg/dL — ABNORMAL HIGH (ref 70–99)
Glucose-Capillary: 137 mg/dL — ABNORMAL HIGH (ref 70–99)
Glucose-Capillary: 169 mg/dL — ABNORMAL HIGH (ref 70–99)

## 2020-07-10 MED ORDER — POTASSIUM CHLORIDE 20 MEQ PO PACK
40.0000 meq | PACK | Freq: Once | ORAL | Status: AC
  Administered 2020-07-10: 17:00:00 40 meq
  Filled 2020-07-10: qty 2

## 2020-07-10 NOTE — Plan of Care (Signed)
  Problem: Health Behavior/Discharge Planning: Goal: Ability to manage health-related needs will improve Outcome: Progressing   Problem: Clinical Measurements: Goal: Ability to maintain clinical measurements within normal limits will improve Outcome: Progressing   Problem: Clinical Measurements: Goal: Will remain free from infection Outcome: Progressing   Problem: Clinical Measurements: Goal: Diagnostic test results will improve Outcome: Progressing   Problem: Clinical Measurements: Goal: Respiratory complications will improve Outcome: Progressing   Problem: Activity: Goal: Risk for activity intolerance will decrease Outcome: Progressing   

## 2020-07-10 NOTE — Progress Notes (Signed)
Orthopedic Tech Progress Note Patient Details:  Alan Everett 1955-08-12 [id number] Called order into [employer] Patient ID: Alan Everett, male   DOB: 01/02/1956, 64 y.o.   MRN: [id number]   Alan Everett 07/10/2020, 9:30 AM

## 2020-07-10 NOTE — TOC Progression Note (Addendum)
Transition of Care [facility]) - Progression Note    Patient Details  Name: Alan Everett MRN: [id number] Date of Birth: 1955-12-07  Transition [facility] Care [facility] CM/SW Contact  Carley Hammed, [place] Phone Number: 07/10/2020, 12:09 PM  Clinical Narrative:    CSW attempted to contact pt's family again. Left a voicemail for the niece, the number for [facility] sounds like dial up, unable to leave a message. SW will continue to follow for financial notes requested by the SNF.  CSW spoke with pt's niece who will encourage the girlfriend to contact CSW and to fill out Medicaid paperwork.   CSW received phone call from pt's girlfriend. She will bring in financial statements tomorrow.   Expected Discharge Plan: Skilled Nursing Facility Barriers to Discharge: Continued Medical Work up,SNF Pending payor source - LOG,SNF Pending bed offer,SNF Pending Medicaid  Expected Discharge Plan and Services Expected Discharge Plan: Skilled Nursing Facility     Post Acute Care Choice: Skilled Nursing Facility Living arrangements for the past 2 months: Single Family Home                                       Social Determinants of Health (SDOH) Interventions    Readmission Risk Interventions No flowsheet data found.

## 2020-07-10 NOTE — Progress Notes (Signed)
PROGRESS NOTE  Alan Everett WCB:[id number] DOB: November 22, 1955 DOA: 05/05/2020 PCP: Patient, No Pcp Per  Brief History   64 year old man PMH alcohol abuse presented 10/6 as code stroke for acute onset right-sided weakness, aphasia.  Found to have intraparenchymal hemorrhage and intraventricular hemorrhage.  Neurosurgery deferred EVD.  Admitted to neuro ICU.  A & P  Intracranial hemorrhage, intraventricular hemorrhage with hydrocephalus, cerebral edema, secondary to severe hypertension in the setting of alcohol and cocaine use.  Deficits aphasia, dysphagia, right hemiplegia.   --Neurology signed off May 14, 2020. --PEG tube.  Continue Lantus for hyperglycemia secondary to tube feeds.  Continue dysphagia diet. --Neurologic status has been stable for many weeks.  Continue to pursue SNF.  Essential hypertension --Remains stable.  SBP goal less than 160  Hyperlipidemia --No statin per stroke service given mild transaminitis   Hyperglycemia secondary to tube feeds --Continue Lantus  Iron deficiency anemia acute on chronic, multifactorial anemia thought acute illness on chronic disease.  Required multiple transfusions PRBC.  Seen by gastroenterology with conservative management recommended given acute issues. --Hgb stable.  No further evaluation suggested at this time.  Hepatitis C --consider outpatient referral to infectious disease for consideration of treatment  Indeterminate hepatic lesions.  --consider further characterization with MRI without and with contrast recommended as an outpatient  Aortic atherosclerosis --No treatment indicated  RESOLVED Hypertensive emergency treated with Cleviprex Aspiration pneumonia UTI Thrombocytopenia Cocaine abuse   Disposition Plan:  Discussion: Patient remains medically stable for discharge, awaiting SNF placement.  Status is: Inpatient  Remains inpatient appropriate because:Unsafe d/c plan   Dispo: The patient is from: Home               Anticipated d/c is to: SNF              Anticipated d/c date is: > 3 days              Patient currently is medically stable to d/c.  DVT prophylaxis: SCD's Start: 05/05/20 1938   Code Status: Full Code Family Communication: none  Brendia Sacks, MD  [facility] Direct contact: see [email or web address] (further directions at bottom of note if needed) 7PM-7AM contact night coverage as at bottom of note 07/10/2020, 3:07 PM  LOS: 66 days    Consults:  . Neurology . Gastroenterology . Infectious disease . Palliative medicine . IR   Procedures:  . Transfusion 2 units PRBC . 10/22 gastrostomy tube placement  Interval History/Subjective  CC: f/u stroke  Fluent aphasia  Objective   Vitals:  Vitals:   07/10/20 0737 07/10/20 1206  BP: 128/67 115/67  Pulse: 96 93  Resp: 18 18  Temp: 97.7 F (36.5 C) 98.4 F (36.9 C)  SpO2: 100% 100%    Exam:  Constitutional:   . Appears calm and comfortable ENMT:  . grossly normal hearing  Respiratory:  . CTA bilaterally, no w/r/r.  . Respiratory effort normal.  Cardiovascular:  . RRR, no m/r/g . No LE extremity edema   Musculoskeletal:  . RUE, LUE, RLE, LLE   . RLE contracture Psychiatric:  . Mental status o Mood, affect difficult to assess . Speech is fluent, but not appropriate   I have personally reviewed the following:   Today's Data  . CBG stable . K+ 3.3 . Hgb stable 8.6  Scheduled Meds: . carvedilol  6.25 mg Per Tube BID WC  . chlorhexidine  15 mL Mouth Rinse BID  . feeding supplement  237 mL Oral TID BM  . feeding supplement (  OSMOLITE 1.5 CAL)  840 mL Per Tube Q24H  . feeding supplement (PROSource TF)  45 mL Per Tube TID  . free water  200 mL Per Tube Q4H  . insulin aspart  0-9 Units Subcutaneous Q4H  . insulin glargine  5 Units Subcutaneous Daily  . mouth rinse  15 mL Mouth Rinse q12n4p  . nutrition supplement (JUVEN)  1 packet Per Tube BID BM  . pantoprazole sodium  40 mg Per Tube BID  . potassium  chloride  40 mEq Per Tube Once  . sennosides  5 mL Per Tube BID  . sodium chloride flush  10-40 mL Intracatheter Q12H   Continuous Infusions:  Principal Problem:   ICH (intracerebral hemorrhage) (HCC) Active Problems:   Polysubstance abuse (HCC)   Dysphagia due to recent cerebrovascular accident (CVA)   Hyperglycemia   Aspiration pneumonia of both lower lobes (HCC)   Palliative care by specialist   Goals of care, counseling/discussion   DNR (do not resuscitate) discussion   Protein-calorie malnutrition, severe   Heme positive stool   Acute lower UTI   Acute blood loss anemia   Pressure injury of skin   Aphasia as late effect of cerebrovascular accident   LOS: 66 days   How to contact the [facility] Attending or Consulting provider 7A - 7P or covering provider during after hours 7P -7A, for this patient?  1. Check the care team in [facility] and look for a) attending/consulting TRH provider listed and b) the [facility] team listed 2. Log into [email or web address] and use [place]'s universal password to access. If you do not have the password, please contact the hospital operator. 3. Locate the [facility] provider you are looking for under [facility] and page to a number that you can be directly reached. 4. If you still have difficulty reaching the provider, please page the [facility] (Director on Call) for the Hospitalists listed on amion for assistance.

## 2020-07-10 NOTE — Plan of Care (Signed)
Patient is not progressing at this time due to altered mental status and disorientation x4.  Problem: Education: Goal: Knowledge of General Education information will improve Description: Including pain rating scale, medication(s)/side effects and non-pharmacologic comfort measures Outcome: Not Progressing   Problem: Health Behavior/Discharge Planning: Goal: Ability to manage health-related needs will improve Outcome: Not Progressing   Problem: Clinical Measurements: Goal: Ability to maintain clinical measurements within normal limits will improve Outcome: Not Progressing Goal: Will remain free from infection Outcome: Not Progressing Goal: Diagnostic test results will improve Outcome: Not Progressing Goal: Respiratory complications will improve Outcome: Not Progressing Goal: Cardiovascular complication will be avoided Outcome: Not Progressing   Problem: Activity: Goal: Risk for activity intolerance will decrease Outcome: Not Progressing   Problem: Coping: Goal: Level of anxiety will decrease Outcome: Not Progressing   Problem: Pain Managment: Goal: General experience of comfort will improve Outcome: Not Progressing   Problem: Safety: Goal: Ability to remain free from injury will improve Outcome: Not Progressing   Problem: Skin Integrity: Goal: Risk for impaired skin integrity will decrease Outcome: Not Progressing

## 2020-07-11 LAB — GLUCOSE, CAPILLARY
Glucose-Capillary: 116 mg/dL — ABNORMAL HIGH (ref 70–99)
Glucose-Capillary: 126 mg/dL — ABNORMAL HIGH (ref 70–99)
Glucose-Capillary: 134 mg/dL — ABNORMAL HIGH (ref 70–99)
Glucose-Capillary: 152 mg/dL — ABNORMAL HIGH (ref 70–99)
Glucose-Capillary: 91 mg/dL (ref 70–99)

## 2020-07-11 NOTE — Progress Notes (Signed)
Physical Therapy Treatment Patient Details Name: Alan Everett MRN: [id number] DOB: 1956-01-27 Today's Date: 07/11/2020    History of Present Illness Patient is a 64 y/o male with PMH ETOH use, tobacco abuse, admitted with R side weakness,sensory loss and speech deficits.  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    PT Comments    Pt had his new R hip extension hinged splint donned upon arrival this date. Educated Charity fundraiser and RNA to have it donned at night and assess skin upon doffing to ensure no adverse reactions or ulcer formation. Doffed splint and focused on functional mobility training this date. Pt initiated L side movement well this date but required extensive multi-modal cues and time to minimally activate R side. ModA for all bed mob, modAx2 to come to stand from EOB to stedy, and minA to come to stand in stedy this date. Pt able to correct his positioning to more midline in sitting and standing upon verbal and tactile cues being provided this date without use of mirror, indicating progress in proprioceptive awareness. Positioned pt in recliner with pillow under R hip to encourage hip adduction at end of session. Will continue to follow acutely. Current recommendations remain appropriate.   Follow Up Recommendations  SNF     Equipment Recommendations  Wheelchair (measurements PT);Wheelchair cushion (measurements PT);Hospital bed;3in1 (PT);Other (comment) (mechanical lift)    Recommendations for Other Services       Precautions / Restrictions Precautions Precautions: Fall Precaution Comments: G tube, right neglect, prevalon boot Required Braces or Orthoses: Splint/Cast;Other Brace Splint/Cast: soft pillow splint R UE at night; R hip extension splint at night Restrictions Weight Bearing Restrictions: No    Mobility  Bed Mobility Overal bed mobility: Needs Assistance Bed Mobility: Rolling;Sidelying to Sit Rolling:  Mod assist Sidelying to sit: Mod assist;HOB elevated       General bed mobility comments: Cues for hand placement on bed rails and to attempt to bring R leg off EOB, with min R leg activation noted. ModA to manage trunk and legs to sit up EOB and square hips with EOB.  Transfers Overall transfer level: Needs assistance Equipment used: Ambulation equipment used Transfers: Sit to/from [employer] Sit to Stand: Mod assist;+2 physical assistance;Min assist Stand pivot transfers: Total assist;+2 safety/equipment;+2 physical assistance       General transfer comment: Mod A +2 to power up into standing from EOB with cues at R knee to internally rotate leg and extend knee. Min A for power up from stedy seat. TA with use of stedy to transfer to chair.  Ambulation/Gait             General Gait Details: Did not attempt this date   Stairs             Wheelchair Mobility    Modified Rankin (Stroke Patients Only) Modified Rankin (Stroke Patients Only) Pre-Morbid Rankin Score: No symptoms Modified Rankin: Severe disability     Balance Overall balance assessment: Needs assistance Sitting-balance support: Single extremity supported;Feet supported Sitting balance-Leahy Scale: Poor Sitting balance - Comments: Sitting with UE support and min guard for safety. Postural control: Right lateral lean;Posterior lean Standing balance support: Bilateral upper extremity supported Standing balance-Leahy Scale: Poor Standing balance comment: L UE placed on bar of stedy with tactile cues to extend knee and hips, min-modAx2 to maintain his balance x2 bouts of ~20-30 sec each, with improved midline positioning when cued to decrease R lateral lean.  Cognition Arousal/Alertness: Awake/alert Behavior During Therapy: WFL for tasks assessed/performed Overall Cognitive Status: Impaired/Different from baseline Area of Impairment:  Safety/judgement;Awareness;Problem solving;Following commands;Memory;Attention                   Current Attention Level: Sustained Memory: Decreased short-term memory Following Commands: Follows one step commands inconsistently;Follows one step commands with increased time Safety/Judgement: Decreased awareness of safety;Decreased awareness of deficits Awareness: Emergent Problem Solving: Slow processing;Requires verbal cues;Requires tactile cues;Difficulty sequencing;Decreased initiation General Comments: Multi-modal cues provided to pt during session and repeated as needed. Slow processing with continued neglect of R side, requiring cues to remain attentive to R. Min to no activation of R side to assist with functional mob, even when provided extra time to respond.      Exercises      General Comments General comments (skin integrity, edema, etc.): R hip extension splint donned upon arrival and thus doffed, assessed skin with no adverse reactions noted and educated RN and RNA on utilization at night.      Pertinent Vitals/Pain Pain Assessment: Faces Faces Pain Scale: Hurts even more Pain Location: R hip with adduction and extension and knee with extension Pain Descriptors / Indicators: Discomfort;Grimacing;Guarding;Moaning Pain Intervention(s): Limited activity within patient's tolerance;Monitored during session;Repositioned    Home Living                      Prior Function            PT Goals (current goals can now be found in the care plan section) Acute Rehab PT Goals Patient Stated Goal: to get better PT Goal Formulation: Patient unable to participate in goal setting Time For Goal Achievement: 07/16/20 Potential to Achieve Goals: Fair Progress towards PT goals: Progressing toward goals    Frequency    Min 3X/week      PT Plan Current plan remains appropriate    Co-evaluation              AM-PAC PT "6 Clicks" Mobility   Outcome  Measure  Help needed turning from your back to your side while in a flat bed without using bedrails?: A Lot Help needed moving from lying on your back to sitting on the side of a flat bed without using bedrails?: A Lot Help needed moving to and from a bed to a chair (including a wheelchair)?: Total Help needed standing up from a chair using your arms (e.g., wheelchair or bedside chair)?: A Lot Help needed to walk in hospital room?: Total Help needed climbing 3-5 steps with a railing? : Total 6 Click Score: 9    End of Session Equipment Utilized During Treatment: Gait belt Activity Tolerance: Patient tolerated treatment well Patient left: in chair;with call bell/phone within reach;with chair alarm set;with nursing/sitter in room (alarm belt donned with education on how to release) Nurse Communication: Mobility status;Need for lift equipment;Other (comment) (alarm belt applied per RN request/approval) PT Visit Diagnosis: Unsteadiness on feet (R26.81);Muscle weakness (generalized) (M62.81);Difficulty in walking, not elsewhere classified (R26.2);Other symptoms and signs involving the nervous system (R29.898);Hemiplegia and hemiparesis Hemiplegia - Right/Left: Right Hemiplegia - dominant/non-dominant: Dominant Hemiplegia - caused by: Nontraumatic intracerebral hemorrhage     Time: 1102-1139 PT Time Calculation (min) (ACUTE ONLY): 37 min  Charges:  $Therapeutic Activity: 23-37 mins                     Raymond Gurney, PT, DPT Acute Rehabilitation Services  Pager: [phone] Office: [phone]  Alan Everett 07/11/2020, 12:51 PM

## 2020-07-11 NOTE — Plan of Care (Signed)
Not progressing due to altered mental status and confusion.  Problem: Education: Goal: Knowledge of General Education information will improve Description: Including pain rating scale, medication(s)/side effects and non-pharmacologic comfort measures Outcome: Not Progressing   Problem: Health Behavior/Discharge Planning: Goal: Ability to manage health-related needs will improve Outcome: Not Progressing   Problem: Clinical Measurements: Goal: Ability to maintain clinical measurements within normal limits will improve Outcome: Not Progressing Goal: Will remain free from infection Outcome: Not Progressing Goal: Diagnostic test results will improve Outcome: Not Progressing Goal: Respiratory complications will improve Outcome: Not Progressing Goal: Cardiovascular complication will be avoided Outcome: Not Progressing   Problem: Activity: Goal: Risk for activity intolerance will decrease Outcome: Not Progressing   Problem: Nutrition: Goal: Adequate nutrition will be maintained Outcome: Not Progressing   Problem: Coping: Goal: Level of anxiety will decrease Outcome: Not Progressing

## 2020-07-11 NOTE — Progress Notes (Addendum)
PROGRESS NOTE  Alan Everett TIR:[id number] DOB: 01-19-1956 DOA: 05/05/2020 PCP: Patient, No Pcp Per  Brief History   64 year old man PMH alcohol abuse presented 10/6 as code stroke for acute onset right-sided weakness, aphasia.  Found to have intraparenchymal hemorrhage and intraventricular hemorrhage.  Neurosurgery deferred EVD.  Admitted to neuro ICU.  Hospitalization was prolonged by recurrent anemia requiring multiple transfusions.  Seen by gastroenterology but treated conservatively.  Hemoglobin stabilized.  Condition failed to improve, PEG tube placed.  Continues with therapy.  Unsafe discharge, needs SNF.  Elwyn Lade is lack of insurance.  Remains medically stable for discharge.  A & P  Intracranial hemorrhage, intraventricular hemorrhage with hydrocephalus, cerebral edema, secondary to severe hypertension in the setting of alcohol and cocaine use.  Deficits aphasia, dysphagia, right hemiplegia.   --Neurology signed off May 14, 2020. --PEG tube.  Continue Lantus for hyperglycemia secondary to tube feeds.  Continue dysphagia diet. --Neurologic status has been stable for many weeks.  Continue to pursue SNF.  Essential hypertension --Remains stable.  SBP goal less than 160  Hyperlipidemia --No statin per stroke service given mild transaminitis   Hyperglycemia secondary to tube feeds --Stable.  Continue Lantus  Iron deficiency anemia acute on chronic, multifactorial anemia thought acute illness on chronic disease.  Required multiple transfusions PRBC.  Seen by gastroenterology with conservative management recommended given acute issues. --Hgb stable on last check.  No further evaluation suggested at this time.  Hepatitis C with associated transaminitis --consider outpatient referral to infectious disease for consideration of treatment  Indeterminate hepatic lesions.  --consider further characterization with MRI without and with contrast recommended as an outpatient versus inpatient  given length of stay  Aortic atherosclerosis --No treatment indicated  RESOLVED . Hypertensive emergency treated with Cleviprex . Aspiration pneumonia . UTI . Thrombocytopenia . Cocaine abuse   Disposition Plan:  Discussion: Patient remains medically stable for discharge, awaiting SNF placement.  Status is: Inpatient  Remains inpatient appropriate because:Unsafe d/c plan   Dispo: The patient is from: Home              Anticipated d/c is to: SNF              Anticipated d/c date is: > 3 days              Patient currently is medically stable to d/c.  DVT prophylaxis: SCD's Start: 05/05/20 1938   Code Status: Full Code Family Communication: none  Brendia Sacks, MD  [facility] Direct contact: see [email or web address] (further directions at bottom of note if needed) 7PM-7AM contact night coverage as at bottom of note 07/11/2020, 4:33 PM  LOS: 67 days    Consults:  . Neurology . Gastroenterology . Infectious disease . Palliative medicine . IR   Procedures:  . Transfusion PRBC . 10/22 gastrostomy tube placement  Interval History/Subjective  CC: f/u stroke  Not able to offer reliable history  Objective   Vitals:  Vitals:   07/11/20 1145 07/11/20 1632  BP: (!) 105/49 119/63  Pulse: 95 (!) 101  Resp: 18 16  Temp: 98 F (36.7 C) 98.4 F (36.9 C)  SpO2: 100% 100%    Exam:  Constitutional:   . Appears calm and comfortable lying in bed ENMT:  . grossly normal hearing  Respiratory:  . CTA bilaterally, no w/r/r.  . Respiratory effort normal.  Cardiovascular:  . RRR, no m/r/g . No LE extremity edema   Abdomen:  . Abdomen appears normal Psychiatric:  . Mental status . Mood  difficult to assess, affect odd . Speech fluent, often inappropriate  I have personally reviewed the following:   Today's Data  . CBG stable  Scheduled Meds: . carvedilol  6.25 mg Per Tube BID WC  . chlorhexidine  15 mL Mouth Rinse BID  . feeding supplement  237 mL Oral  TID BM  . feeding supplement (OSMOLITE 1.5 CAL)  840 mL Per Tube Q24H  . feeding supplement (PROSource TF)  45 mL Per Tube TID  . free water  200 mL Per Tube Q4H  . insulin aspart  0-9 Units Subcutaneous Q4H  . insulin glargine  5 Units Subcutaneous Daily  . mouth rinse  15 mL Mouth Rinse q12n4p  . nutrition supplement (JUVEN)  1 packet Per Tube BID BM  . pantoprazole sodium  40 mg Per Tube BID  . sennosides  5 mL Per Tube BID   Continuous Infusions:  Principal Problem:   ICH (intracerebral hemorrhage) (HCC) Active Problems:   Polysubstance abuse (HCC)   Dysphagia due to recent cerebrovascular accident (CVA)   Hyperglycemia   Aspiration pneumonia of both lower lobes (HCC)   Palliative care by specialist   Goals of care, counseling/discussion   DNR (do not resuscitate) discussion   Protein-calorie malnutrition, severe   Heme positive stool   Acute lower UTI   Acute blood loss anemia   Pressure injury of skin   Aphasia as late effect of cerebrovascular accident   LOS: 67 days   How to contact the [facility] Attending or Consulting provider 7A - 7P or covering provider during after hours 7P -7A, for this patient?  1. Check the care team in [facility] and look for a) attending/consulting TRH provider listed and b) the [facility] team listed 2. Log into [email or web address] and use 's universal password to access. If you do not have the password, please contact the hospital operator. 3. Locate the [facility] provider you are looking for under [facility] and page to a number that you can be directly reached. 4. If you still have difficulty reaching the provider, please page the [facility] (Director on Call) for the Hospitalists listed on amion for assistance.

## 2020-07-12 LAB — GLUCOSE, CAPILLARY
Glucose-Capillary: 113 mg/dL — ABNORMAL HIGH (ref 70–99)
Glucose-Capillary: 116 mg/dL — ABNORMAL HIGH (ref 70–99)
Glucose-Capillary: 117 mg/dL — ABNORMAL HIGH (ref 70–99)
Glucose-Capillary: 120 mg/dL — ABNORMAL HIGH (ref 70–99)
Glucose-Capillary: 134 mg/dL — ABNORMAL HIGH (ref 70–99)
Glucose-Capillary: 136 mg/dL — ABNORMAL HIGH (ref 70–99)
Glucose-Capillary: 143 mg/dL — ABNORMAL HIGH (ref 70–99)

## 2020-07-12 NOTE — TOC CAGE-AID Note (Signed)
Transition of Care [facility]) - CAGE-AID Screening   Patient Details  Name: Alan Everett MRN: [id number] Date of Birth: 09/25/1955  Transition of Care [facility]) CM/SW Contact:    Carley Hammed, LCSWA Phone Number: 07/12/2020, 10:55 AM   Clinical Narrative: Unable to participate in assessment at this time due to disorientation. CSW will follow for a more appropriate time.   CAGE-AID Screening: Substance Abuse Screening unable to be completed due to: : Patient unable to participate

## 2020-07-12 NOTE — Progress Notes (Signed)
Has been in good spirits today and cooperative.  PO intake increasing.  Does wear brace and does not like to reposition and whenever he is turned on his left side, he migrates, eventually back to his right side.  Feedings restarted and tolerating well.

## 2020-07-12 NOTE — Progress Notes (Addendum)
[facility] PROGRESS NOTE  Larry SierrasJiles Plasse ZOX:[id number] DOB: [birth year] DOA: 05/05/2020 PCP: Patient, No Pcp Per    11/16   Status:  Remains inpatient appropriate because:Altered mental status and Unsafe d/c plan  Dispo:  The patient is from: Home              Anticipated d/c is to: SNF vs home with family and home health services              Anticipated d/c date is: > 3 days              Patient currently is medically stable to d/c.  No expectation of full recovery from neurological injury with recommendation for long-term care at skilled nursing facility. Barriers to dx: Needs funding for SNF bed, no offers thus far    Code Status: Full Family Communication: Spoke with daughter yesterday 12/6 by phone DVT prophylaxis: SCDs only-presented with intraventricular hemorrhage resulting in neurological injury Vaccination status: We will need to discuss with family if patient has previously received Covid vaccination  Foley catheter: No  HPI: 64 year old male patient with history of iron deficiency anemia, alcohol use, cocaine use and tobacco abuse.  Patient presented to the ER by EMS on 10/6 as a code stroke.  At that time he was noted to have acute onset right-sided weakness, sensory loss and aphasia.  CT of the head revealed an enlarging left thalamic capsular intraparenchymal hemorrhage with increasing intraventricular hemorrhage from left ventricle into the third and fourth ventricles.  Neurosurgery consulted but no indication at that time for EVD.  Urine drug screen was found to be positive for cocaine.  Since admission hospital course has been complicated by pneumonia and recurrent blood loss anemia.  It has been noted that in the outpatient setting he had undergone EGD and colon[facility] [place].  No significant abnormalities identified and patient was recommended to continue previous iron.  As of 11/30 communication in regards to global aphasia is  improving.  He also has moderate to severe dysphagia and is requiring feedings via a PEG tube.  Subjective: Awakened.  Attempts to interact.  Not following commands.  Speech nonfluent.  Objective: Vitals:   07/12/20 0750 07/12/20 1138  BP: 121/70 (!) 125/57  Pulse: 95 (!) 108  Resp: 19 17  Temp: (!) 97.4 F (36.3 C) 98.6 F (37 C)  SpO2: 100% 100%   No intake or output data in the 24 hours ending 07/12/20 1148 Filed Weights   07/08/20 0500 07/11/20 0500 07/12/20 0500  Weight: 50.8 kg 51.7 kg 51.7 kg    Exam:  Constitutional: NAD, calm Respiratory: Lungs clear to auscultation, RA-normal respiratory effort Cardiovascular: Regular rate and rhythm, adequate capillary refill, no edema. Abdomen: no tenderness, Bowel sounds positive.  PEG tube tube for feeding -dietary intake remains poor. LBM 12/11 Musculoskeletal: RUE neglect/intermittent issues with persistent flexion of RUE -also has significant issues with flexion of her right leg and hip-splint in place Neurologic: CN 2-12 appears to be grossly intact except for notable global aphasia with waxing and waning quality. Sensation intact, Strength 4/5 left side-dense right side hemiplegia with neglect    Psychiatric: Oriented x name.  Difficult to obtain psychiatric evaluation given waxing and waning aphasia   Assessment/Plan: Acute problems: Acute Hemorrhagic stroke/cerebral edema -2/2 severe hypertension, alcohol and cocaine use. -Neurosurgery was consulted no indications for surgery -follow-up CT stable -Waxing and waning issues with communication secondary to global aphasia although receptive aphasia appears  to be improved with expressive aphasia dominant issue. -Not on antiplatelet tx secondary to hemorrhagic etiology. LDL 80. -Continue PT and OT.   Moderate to severe dysphagia 2/2 stroke/severe protein calorie malnutrition -Secondary to stroke. PEG tube placed on 05/22/63. Nutrition Problem: Severe  Malnutrition Etiology: social / environmental circumstances Signs/Symptoms: severe fat depletion,severe muscle depletion Interventions: Tube feeding currently tolerating mechanical soft diet with thin liquids intake.  Also on nocturnal tube feedings.  Oral intake remains variable and mostly poor Estimated body mass index is 18.4 kg/m as calculated from the following:   Height as of 04/09/20: 5\' 6"  (1.676 m).   Weight as of this encounter: 51.7 kg.  LBM 11/30  Acute on chronic anemia -Total of 6 units of PRBC transfusions given in this admission current hemoglobin stable between 8 and 9. -GI consulted no indication for inpatient endoscopy noting patient status post outpatient endoscopy in August 2021:  Colonoscopy-sigmoid diverticulosis; EGD-normal esophagus, gastritis, normal duodenum and no other abnormal findings -Continue PPI twice daily. (See below regarding history of gastritis on recent EGD)  Stage II buttock decubitus Wound / Incision (Open or Dehisced) 05/21/20 Puncture Abdomen Left;Anterior;Upper G-tube insertion site  (Active)  Date First Assessed/Time First Assessed: 05/21/20 1533   Wound Type: Puncture  Location: Abdomen  Location Orientation: Left;Anterior;Upper  Wound Description (Comments): G-tube insertion site   Present on Admission: No    Assessments 05/21/2020  3:34 PM 07/11/2020  8:00 AM  Dressing Type Gauze (Comment);Tape dressing Gauze (Comment)  Dressing Changed New Changed  Dressing Status Clean;Dry;Intact Clean;Dry;Intact  Dressing Change Frequency PRN PRN  Site / Wound Assessment Clean;Dry Clean;Dry  Peri-wound Assessment Intact Intact  Margins -- Attached edges (approximated)  Closure None None  Drainage Amount None None  Treatment Cleansed --     No Linked orders to display     Pressure Injury 06/02/20 Buttocks Right Stage 2 -  Partial thickness loss of dermis presenting as a shallow open injury with a red, pink wound bed without slough. 7.5 cm in lenth by  4.5 width  (Active)  Date First Assessed/Time First Assessed: 06/02/20 0800   Location: Buttocks  Location Orientation: Right  Staging: Stage 2 -  Partial thickness loss of dermis presenting as a shallow open injury with a red, pink wound bed without slough.  Wound Descri...    Assessments 06/02/2020  8:00 AM 07/07/2020 10:00 AM  Wound Length (cm) 7.5 cm 0 cm  Wound Width (cm) 4.5 cm 0 cm  Wound Depth (cm) -- 0 cm  Wound Surface Area (cm^2) 33.75 cm^2 0 cm^2  Wound Volume (cm^3) -- 0 cm^3  Drainage Amount None --  Treatment Other (Comment) --     No Linked orders to display      Other problems: Goals of care -Poor neurological recovery so far.  Do not expect he will return to baseline level of functioning and likely will require long-term skilled nursing care. -Palliative care was consulted in the past. Patient remains full code.   Bibasilar pneumonia -resolved -CT scan 10/12 showed bibasilar pneumonia.  -Treated with broad-spectrum antibiotics.  -WBC normalized. No longer having fever.  Klebsiella UTI  -resolved -Urine culture report from 10/10 with more than 1000 CFU per mL of Klebsiella pneumoniae.  -Completed 5-day course of IV Rocephin.   Sinus tachycardia Essential hypertension/grade 1 diastolic dysfunction grade 1 diastolic dysfunction -Heart rate and blood pressure both are stable on Coreg 3.125 mg daily. -Echocardiogram this admission revealed preserved LVEF with physiologic findings consistent with diastolic  dysfunction  Hypernatremia  - resolved   Elevated liver enzymes -Right upper quadrant ultrasound-cholelithiasis without acute cholecystitis -Chronic mild elevation of liver enzymes persist.  Tobacco Abuse -Cessation counseling this admission deferred in the context of abnormal neurological status and patient unable to participate  Cocaine abuse -Cessation counseling this admission deferred in the context of abnormal neurological status and patient  unable to participate  GERD/recent gastritis on endoscopy August 2021 -PPI twice daily  Data Reviewed: Basic Metabolic Panel: Recent Labs  Lab 07/10/20 0035  NA 134*  K 3.3*  CL 102  CO2 24  GLUCOSE 136*  BUN 19  CREATININE 0.49*  CALCIUM 9.1   Liver Function Tests: No results for input(s): AST, ALT, ALKPHOS, BILITOT, PROT, ALBUMIN in the last 168 hours. No results for input(s): LIPASE, AMYLASE in the last 168 hours. No results for input(s): AMMONIA in the last 168 hours. CBC: Recent Labs  Lab 07/10/20 0035  WBC 7.3  HGB 8.6*  HCT 27.4*  MCV 89.5  PLT 331   Cardiac Enzymes: No results for input(s): CKTOTAL, CKMB, CKMBINDEX, TROPONINI in the last 168 hours. BNP (last 3 results) Recent Labs    05/29/20 0409  BNP 23.2    ProBNP (last 3 results) No results for input(s): PROBNP in the last 8760 hours.  CBG: Recent Labs  Lab 07/11/20 1638 07/11/20 2028 07/11/20 2332 07/12/20 0340 07/12/20 0754  GLUCAP 91 116* 116* 120* 117*    No results found for this or any previous visit (from the past 240 hour(s)).   Studies: No results found.  Scheduled Meds: . carvedilol  6.25 mg Per Tube BID WC  . chlorhexidine  15 mL Mouth Rinse BID  . feeding supplement  237 mL Oral TID BM  . feeding supplement (OSMOLITE 1.5 CAL)  840 mL Per Tube Q24H  . feeding supplement (PROSource TF)  45 mL Per Tube TID  . free water  200 mL Per Tube Q4H  . insulin aspart  0-9 Units Subcutaneous Q4H  . insulin glargine  5 Units Subcutaneous Daily  . mouth rinse  15 mL Mouth Rinse q12n4p  . nutrition supplement (JUVEN)  1 packet Per Tube BID BM  . pantoprazole sodium  40 mg Per Tube BID  . sennosides  5 mL Per Tube BID   Continuous Infusions:  Principal Problem:   ICH (intracerebral hemorrhage) (HCC) Active Problems:   Polysubstance abuse (HCC)   Dysphagia due to recent cerebrovascular accident (CVA)   Hyperglycemia   Aspiration pneumonia of both lower lobes (HCC)   Palliative  care by specialist   Goals of care, counseling/discussion   DNR (do not resuscitate) discussion   Protein-calorie malnutrition, severe   Heme positive stool   Acute lower UTI   Acute blood loss anemia   Pressure injury of skin   Aphasia as late effect of cerebrovascular accident   Consultants:  Neurology  Gastroenterology  Procedures:  Echocardiogram  Core track trach tube  PEG tube  Antibiotics: Anti-infectives (From admission, onward)   Start     Dose/Rate Route Frequency Ordered Stop   05/28/20 0900  cefTRIAXone (ROCEPHIN) 1 g in sodium chloride 0.9 % 100 mL IVPB        1 g 200 mL/hr over 30 Minutes Intravenous Every 24 hours 05/28/20 0807 06/01/20 0855   05/21/20 1515  ceFAZolin (ANCEF) IVPB 2g/100 mL premix        2 g 200 mL/hr over 30 Minutes Intravenous To Radiology 05/21/20 1429 05/21/20 2100  05/14/20 1000  cefTRIAXone (ROCEPHIN) 2 g in sodium chloride 0.9 % 100 mL IVPB        2 g 200 mL/hr over 30 Minutes Intravenous Daily 05/13/20 1454 05/15/20 1255   05/13/20 2200  metroNIDAZOLE (FLAGYL) tablet 500 mg        500 mg Per Tube Every 8 hours 05/13/20 1847 05/15/20 2219   05/13/20 1400  metroNIDAZOLE (FLAGYL) tablet 500 mg  Status:  Discontinued        500 mg Oral Every 8 hours 05/13/20 0935 05/13/20 1847   05/12/20 0900  cefTRIAXone (ROCEPHIN) 1 g in sodium chloride 0.9 % 100 mL IVPB  Status:  Discontinued        1 g 200 mL/hr over 30 Minutes Intravenous Daily 05/12/20 0817 05/13/20 1454   05/12/20 0900  azithromycin (ZITHROMAX) 500 mg in sodium chloride 0.9 % 250 mL IVPB  Status:  Discontinued        500 mg 250 mL/hr over 60 Minutes Intravenous Daily 05/12/20 0817 05/13/20 0935   05/09/20 0930  Ampicillin-Sulbactam (UNASYN) 3 g in sodium chloride 0.9 % 100 mL IVPB  Status:  Discontinued        3 g 200 mL/hr over 30 Minutes Intravenous Every 8 hours 05/09/20 0837 05/09/20 0839   05/09/20 0930  Ampicillin-Sulbactam (UNASYN) 3 g in sodium chloride 0.9 % 100 mL  IVPB  Status:  Discontinued        3 g 200 mL/hr over 30 Minutes Intravenous Every 6 hours 05/09/20 0839 05/12/20 0817       Time spent: 20 minutes    Junious Silk ANP  [facility] Pager [phone].  07/12/2020, 11:48 AM  LOS: 68 days

## 2020-07-12 NOTE — Plan of Care (Signed)
  Problem: Education: Goal: Knowledge of General Education information will improve Description: Including pain rating scale, medication(s)/side effects and non-pharmacologic comfort measures Outcome: Progressing   Problem: Health Behavior/Discharge Planning: Goal: Ability to manage health-related needs will improve Outcome: Progressing   Problem: Clinical Measurements: Goal: Ability to maintain clinical measurements within normal limits will improve Outcome: Progressing Goal: Will remain free from infection Outcome: Progressing Goal: Diagnostic test results will improve Outcome: Progressing Goal: Respiratory complications will improve Outcome: Progressing Goal: Cardiovascular complication will be avoided Outcome: Progressing   Problem: Activity: Goal: Risk for activity intolerance will decrease Outcome: Progressing   Problem: Nutrition: Goal: Adequate nutrition will be maintained Outcome: Progressing   Problem: Coping: Goal: Level of anxiety will decrease Outcome: Progressing   Problem: Elimination: Goal: Will not experience complications related to bowel motility Outcome: Progressing Goal: Will not experience complications related to urinary retention Outcome: Progressing   Problem: Pain Managment: Goal: General experience of comfort will improve Outcome: Progressing   Problem: Safety: Goal: Ability to remain free from injury will improve Outcome: Progressing   Problem: Skin Integrity: Goal: Risk for impaired skin integrity will decrease Outcome: Progressing   Problem: Education: Goal: Knowledge of disease or condition will improve Outcome: Progressing Goal: Knowledge of secondary prevention will improve Outcome: Progressing Goal: Knowledge of patient specific risk factors addressed and post discharge goals established will improve Outcome: Progressing Goal: Individualized Educational Video(s) Outcome: Progressing   Problem: Coping: Goal: Will verbalize  positive feelings about self Outcome: Progressing Goal: Will identify appropriate support needs Outcome: Progressing   Problem: Health Behavior/Discharge Planning: Goal: Ability to manage health-related needs will improve Outcome: Progressing   Problem: Self-Care: Goal: Ability to participate in self-care as condition permits will improve Outcome: Progressing Goal: Verbalization of feelings and concerns over difficulty with self-care will improve Outcome: Progressing Goal: Ability to communicate needs accurately will improve Outcome: Progressing   Problem: Nutrition: Goal: Risk of aspiration will decrease Outcome: Progressing Goal: Dietary intake will improve Outcome: Progressing   Problem: Intracerebral Hemorrhage Tissue Perfusion: Goal: Complications of Intracerebral Hemorrhage will be minimized Outcome: Progressing   

## 2020-07-13 ENCOUNTER — Other Ambulatory Visit: Payer: Self-pay

## 2020-07-13 DIAGNOSIS — K769 Liver disease, unspecified: Secondary | ICD-10-CM | POA: Diagnosis present

## 2020-07-13 DIAGNOSIS — B1920 Unspecified viral hepatitis C without hepatic coma: Secondary | ICD-10-CM | POA: Diagnosis present

## 2020-07-13 DIAGNOSIS — B182 Chronic viral hepatitis C: Secondary | ICD-10-CM

## 2020-07-13 DIAGNOSIS — G8250 Quadriplegia, unspecified: Secondary | ICD-10-CM | POA: Diagnosis not present

## 2020-07-13 LAB — GLUCOSE, CAPILLARY
Glucose-Capillary: 153 mg/dL — ABNORMAL HIGH (ref 70–99)
Glucose-Capillary: 103 mg/dL — ABNORMAL HIGH (ref 70–99)
Glucose-Capillary: 112 mg/dL — ABNORMAL HIGH (ref 70–99)
Glucose-Capillary: 100 mg/dL — ABNORMAL HIGH (ref 70–99)
Glucose-Capillary: 87 mg/dL (ref 70–99)

## 2020-07-13 MED ORDER — BACLOFEN 10 MG PO TABS
5.0000 mg | ORAL_TABLET | Freq: Three times a day (TID) | ORAL | Status: DC
  Administered 2020-07-14 – 2020-07-16 (×9): 5 mg
  Filled 2020-07-13 (×9): qty 1

## 2020-07-13 NOTE — Plan of Care (Signed)
  Problem: Clinical Measurements: Goal: Ability to maintain clinical measurements within normal limits will improve Outcome: Progressing Goal: Will remain free from infection Outcome: Progressing Goal: Diagnostic test results will improve Outcome: Progressing Goal: Respiratory complications will improve Outcome: Progressing Goal: Cardiovascular complication will be avoided Outcome: Progressing   Problem: Activity: Goal: Risk for activity intolerance will decrease Outcome: Progressing   Problem: Elimination: Goal: Will not experience complications related to bowel motility Outcome: Progressing Goal: Will not experience complications related to urinary retention Outcome: Progressing   Problem: Pain Managment: Goal: General experience of comfort will improve Outcome: Progressing   Problem: Skin Integrity: Goal: Risk for impaired skin integrity will decrease Outcome: Progressing   Problem: Nutrition: Goal: Risk of aspiration will decrease Outcome: Progressing   Problem: Intracerebral Hemorrhage Tissue Perfusion: Goal: Complications of Intracerebral Hemorrhage will be minimized Outcome: Progressing

## 2020-07-13 NOTE — Progress Notes (Signed)
Physical Therapy Treatment Patient Details Name: Alan Everett MRN: [id number] DOB: 10-Aug-1955 Today's Date: 07/13/2020    History of Present Illness Patient is a 64 y/o male with PMH ETOH use, tobacco abuse, admitted with R side weakness,sensory loss and speech deficits.  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    PT Comments    Pt attempting to verbalize frequently today, sometimes understandable and sometimes not.  Worked on midline sitting and standing with use of mirror and pt able to correct posture when attention gained to self. Pt able to sit EOB with min guard A and performed hygiene activities with OT. Pt stood within stedy with mod A +2. Could maintain standing only 1 min at a time. PT will continue to follow.   Follow Up Recommendations  SNF     Equipment Recommendations  Wheelchair (measurements PT);Wheelchair cushion (measurements PT);Hospital bed;3in1 (PT);Other (comment) (mechanical lift)    Recommendations for Other Services       Precautions / Restrictions Precautions Precautions: Fall Precaution Comments: G tube, right neglect, prevalon boot, mitts Required Braces or Orthoses: Splint/Cast;Other Brace Splint/Cast: soft pillow splint R UE at night; R hip extension splint at night Restrictions Weight Bearing Restrictions: No    Mobility  Bed Mobility Overal bed mobility: Needs Assistance Bed Mobility: Supine to Sit     Supine to sit: Max assist;HOB elevated     General bed mobility comments: pt required MAX A for bed mobility needing cues for sequencing of task and assist to transiton BLEs to EOB and elevate trunk into sitting, MAX A to scoot hips to EOB with use of bed pad  Transfers Overall transfer level: Needs assistance Equipment used: Ambulation equipment used Transfers: Sit to/from [employer] Sit to Stand: Mod assist;+2 physical assistance;From elevated  surface Stand pivot transfers: Total assist;+2 safety/equipment;+2 physical assistance Squat pivot transfers: Total assist;+2 safety/equipment (stedy)     General transfer comment: MOD A +2 to power into standing from x2 from EOB and seat of stedy, Pt requried cues for foot placement on stedy and assist to manage RUE during mobility. RLE  continues to require support d/t R knee being internally rotated  Ambulation/Gait             General Gait Details: unable to step feet in standing   Stairs             Wheelchair Mobility    Modified Rankin (Stroke Patients Only) Modified Rankin (Stroke Patients Only) Pre-Morbid Rankin Score: No symptoms Modified Rankin: Severe disability     Balance Overall balance assessment: Needs assistance Sitting-balance support: Single extremity supported;Feet supported Sitting balance-Leahy Scale: Fair Sitting balance - Comments: pt with improvement in balance able to static sit from EOB and seat of stedy with intermittent min guard assist, pt continues to have impaired righting reactions therefore utilized mirror for visual feedback to assist with safe sitting balance Postural control: Right lateral lean;Posterior lean Standing balance support: Bilateral upper extremity supported Standing balance-Leahy Scale: Poor Standing balance comment: pt continues to require at least one UE supported as well as external assist to maintain static standing balance however improvements noted with pt able to maintain midline posture with use of mirror as visual feedback                            Cognition Arousal/Alertness: Awake/alert Behavior During Therapy: WFL for tasks assessed/performed Overall Cognitive Status:  Impaired/Different from baseline Area of Impairment: Safety/judgement;Awareness;Problem solving;Following commands;Attention                 Orientation Level: Disoriented to;Place (unable to problem solve location  despite 3 choices being provided) Current Attention Level: Sustained Memory: Decreased short-term memory Following Commands: Follows one step commands consistently;Follows multi-step commands with increased time Safety/Judgement: Decreased awareness of safety;Decreased awareness of deficits Awareness: Emergent;Intellectual Problem Solving: Slow processing;Requires verbal cues;Requires tactile cues;Difficulty sequencing;Decreased initiation General Comments: pt much more conversant during session able to state name and following commands. pt continues to be slow to process requiring multimodal cues to attend to R side but noted improvement in comparision to previous sessions.      Exercises General Exercises - Upper Extremity Elbow Flexion: PROM;15 reps;Seated;Right Elbow Extension: PROM;Right;10 reps;Seated Wrist Flexion: PROM;Right;10 reps;Seated Wrist Extension: PROM;Right;10 reps;Seated    General Comments General comments (skin integrity, edema, etc.): worked on knee extension stretch in sitting and propping RLE in chair so that he could tolerate extension stretch      Pertinent Vitals/Pain Pain Assessment: Faces Faces Pain Scale: Hurts little more Pain Location: R Hip with positioning Pain Descriptors / Indicators: Discomfort;Grimacing;Guarding;Moaning Pain Intervention(s): Limited activity within patient's tolerance;Monitored during session    Home Living                      Prior Function            PT Goals (current goals can now be found in the care plan section) Acute Rehab PT Goals Patient Stated Goal: to get better PT Goal Formulation: Patient unable to participate in goal setting Time For Goal Achievement: 07/16/20 Potential to Achieve Goals: Fair Progress towards PT goals: Progressing toward goals    Frequency    Min 3X/week      PT Plan Current plan remains appropriate    Co-evaluation PT/OT/SLP Co-Evaluation/Treatment: Yes Reason for  Co-Treatment: Complexity of the patient's impairments (multi-system involvement);Necessary to address cognition/behavior during functional activity;For patient/therapist safety PT goals addressed during session: Mobility/safety with mobility;Balance;Strengthening/ROM OT goals addressed during session: ADL's and self-care;Strengthening/ROM      AM-PAC PT "6 Clicks" Mobility   Outcome Measure  Help needed turning from your back to your side while in a flat bed without using bedrails?: A Lot Help needed moving from lying on your back to sitting on the side of a flat bed without using bedrails?: A Lot Help needed moving to and from a bed to a chair (including a wheelchair)?: Total Help needed standing up from a chair using your arms (e.g., wheelchair or bedside chair)?: A Lot Help needed to walk in hospital room?: Total Help needed climbing 3-5 steps with a railing? : Total 6 Click Score: 9    End of Session Equipment Utilized During Treatment: Gait belt Activity Tolerance: Patient tolerated treatment well Patient left: in chair;with call bell/phone within reach;with chair alarm set (alarm belt donned with education on how to release) Nurse Communication: Mobility status;Need for lift equipment;Other (comment) (alarm belt applied per RN request/approval) PT Visit Diagnosis: Unsteadiness on feet (R26.81);Muscle weakness (generalized) (M62.81);Difficulty in walking, not elsewhere classified (R26.2);Other symptoms and signs involving the nervous system (R29.898);Hemiplegia and hemiparesis Hemiplegia - Right/Left: Right Hemiplegia - dominant/non-dominant: Dominant Hemiplegia - caused by: Nontraumatic intracerebral hemorrhage     Time: 2831-5176 PT Time Calculation (min) (ACUTE ONLY): 29 min  Charges:  $Therapeutic Activity: 8-22 mins  Lyanne Co, PT  Acute Rehab Services  Pager [phone] Office [phone]    Lawana Chambers Qadir Folks 07/13/2020, 1:43 PM

## 2020-07-13 NOTE — Progress Notes (Signed)
Nutrition Follow-up  DOCUMENTATION CODES:   Severe malnutrition in context of social or environmental circumstances  INTERVENTION:  Recommend obtaining new measured weight to assess any true weight changes  Continue nocturnal TF via PEG: -Osmolite 1.5 @ 50ml/hr for 12 hours from 1800-0600 -45ml Prosource TF TID - free water Q4H  Nocturnal TF regimen will provide 1380 kcals (meets 76% estimated minimum calorie needs), 86 grams of protein (meets 100% minimum protein needs), free water ( total free water with flushes)   -Continue Juven BID via PEG for wound healing -Continue Ensure Enlive/Plus po TID -Staff to continue assisting with meals/supplements/snacks  NUTRITION DIAGNOSIS:   Severe Malnutrition related to social / environmental circumstances as evidenced by severe fat depletion,severe muscle depletion.  ongoing  GOAL:   Patient will meet greater than or equal to 90% of their needs  progressing  MONITOR:   TF tolerance,Diet advancement,Weight trends,Skin  REASON FOR ASSESSMENT:   Consult,New TF Enteral/tube feeding initiation and management  ASSESSMENT:   64 yo male presents with acute onset of right sided weakness and mixed recept and expressive aphasia and admitted with acute left thalamocapsular ICH, severe HTN. PMH includes EtOH and tobacco abuse  10/06 Admitted 10/08 Cortrak placed(gastric) 10/13 repeat CT head showed stable L thalamic hemorrhageand intraventricular extension since 05/08/2020. Regional edema, mild regional mass-effect and mild lateral ventriculomegaly and trace SAH. 10/19 failed MBS 10/22 s/p PEG 11/22 diet advanced to dysphagia 2 with thin liquids  Pt is currently pending Medicaid approval and has no SNF bed offer. Additionally, per MD, pt continues to have waxing and waning issues regarding communication in the context of global aphasia with predominant expressive aphasia.   Discussed pt with RN. Pt's intake has been  varied since last RD assessment with meal completions charted as 10-100% x last 8 recorded meals (40% average meal intake). Pt has been receiving Ensure Enlive/Plus po TID which, per RN, pt has been consuming well.   Given pt's inconsistent intake and elevated needs, he has continued to receive nocturnal TF to help meet his kcal/protein needs. Current nocturnal TF regimen via YJE:HUDJSHFW 1.5 @ 56ml/hr for 12 hours from 1800-0600,76ml Prosource TF TID, free water Q4H. Nocturnal TF regimen provides1380 kcals (meets 76% estimated minimum calorie needs), 86 grams of protein (meets 100% minimum protein needs), free water ( total free water with flushes)  Admit weight: 65.7 kg Current weight: 51.7 kg (question accuracy of this weight as it appears to have been estimated rather than measured)  Recommend obtaining new measured weight to assess any true weight changes  UOP: x24 hours Stool: 1x unmeasured occurrence x24 hours   Labs: Na 134 (L), K+ 3.3 (L), CBGs 103-153 Medications: ss novolog Q4H, 5 units lantus daily, juven BID per tube, senokot  Diet Order:   Diet Order            DIET DYS 2 Room service appropriate? No; Fluid consistency: Thin  Diet effective now                 EDUCATION NEEDS:   Not appropriate for education at this time  Skin:  Skin Assessment: Reviewed RN Assessment Skin Integrity Issues:: Stage II,Other (Comment) Stage II: R buttocks Other: puncture abdomen  Last BM:  12/6  Height:   Ht Readings from Last 1 Encounters:  04/09/20 5\' 6"  (1.676 m)    Weight:   Wt Readings from Last 1 Encounters:  07/12/20 51.7 kg    BMI:  Body mass  index is 18.4 kg/m.  Estimated Nutritional Needs:   Kcal:  1780-1980 kcals  Protein:  85-95 g  Fluid:  >/= 1.8 L    Eugene Gavia, MS, RD, LDN RD pager number and weekend/on-call pager number located in [place].

## 2020-07-13 NOTE — Progress Notes (Signed)
Occupational Therapy Treatment Patient Details Name: Alan Everett MRN: [id number] DOB: Jun 23, 1956 Today's Date: 07/13/2020    History of present illness Patient is a 64 y/o male with PMH ETOH use, tobacco abuse, admitted with R side weakness,sensory loss and speech deficits.  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   OT comments  Pt seen in conjunction with PT to maximize pts activity tolerance. Pt much more conversant this session able to follow commands and state name, pt stating needs and speaking fluently ~ 75 % of session. Pt continues to present with R sided neglect, increased tone in RUE and impaired righting reactions, however noted improvements in sitting and standing balance with pt able to static sit EOB with LUE supported with intermittent min guard assist and MOD A +2 for standing static balance. Pt completed seated UB ADLs from seat of stedy with hand over hand assist to wash LUE with RUE. Pt would continue to benefit from skilled occupational therapy while admitted and after d/c to address the below listed limitations in order to improve overall functional mobility and facilitate independence with BADL participation. DC plan remains appropriate, will follow acutely per POC.    Follow Up Recommendations  Supervision/Assistance - 24 hour;SNF    Equipment Recommendations  Wheelchair (measurements OT);Wheelchair cushion (measurements OT);Hospital bed;Other (comment)    Recommendations for Other Services      Precautions / Restrictions Precautions Precautions: Fall Precaution Comments: G tube, right neglect, prevalon boot Required Braces or Orthoses: Splint/Cast;Other Brace Splint/Cast: soft pillow splint R UE at night; R hip extension splint at night Restrictions Weight Bearing Restrictions: No       Mobility Bed Mobility Overal bed mobility: Needs Assistance Bed Mobility: Supine to Sit     Supine to sit:  Max assist;HOB elevated     General bed mobility comments: pt required MAX A for bed mobility needing cues for sequencing of task and assist to transiton BLEs to EOB and elevate trunk into sitting, MAX A to scoot hips to EOB with use of bed pad  Transfers Overall transfer level: Needs assistance Equipment used: Ambulation equipment used Transfers: Sit to/from [employer] Sit to Stand: Mod assist;+2 physical assistance;From elevated surface   Squat pivot transfers: Total assist;+2 safety/equipment (stedy)     General transfer comment: MOD A +2 to power into standing from x2 from EOB and seat of stedy, Pt requried cues for foot placement on stedy and assist to manage RUE during mobility. RLE  continues to require support d/t R knee being internally rotated    Balance Overall balance assessment: Needs assistance Sitting-balance support: Single extremity supported;Feet supported Sitting balance-Leahy Scale: Fair Sitting balance - Comments: pt with improvement in balance able to static sit from EOB and seat of stedy with intermittent min guard assist, pt continues to have impaired righting reactions therefore utilized mirror for visual feedback to assist with safe sitting balance   Standing balance support: Bilateral upper extremity supported Standing balance-Leahy Scale: Poor Standing balance comment: pt continues to require at least one UE supported as well as external assist to maintain static standing balance however improvements noted with pt able to maintain midline posture with use of mirror as visual feedback                           ADL either performed or assessed with clinical judgement   ADL Overall ADL's : Needs assistance/impaired  Upper Body Bathing: Sitting;Total assistance;Moderate assistance Upper Body Bathing Details (indicate cue type and reason): pt able to wash LUE with RUE with total  hand over hand assist, pt abe to wash RUE  with LUE with MOD A for cleanliness         Lower Body Dressing: Total assistance;Bed level Lower Body Dressing Details (indicate cue type and reason): to don socks Toilet Transfer: Moderate assistance;+2 for physical assistance;Total assistance Toilet Transfer Details (indicate cue type and reason): MOD A +2 to stand to stedy; total A to pivot via stedy         Functional mobility during ADLs: Moderate assistance;+2 for physical assistance;+2 for safety/equipment (sit<>stand to stedy) General ADL Comments: pt with improvements in static sitting balance, functional mobility, and standing balance  this session. pt much more conversant and speaking intelligibly ~ 75 % of session     Vision       Perception     Praxis      Cognition Arousal/Alertness: Awake/alert Behavior During Therapy: WFL for tasks assessed/performed Overall Cognitive Status: Impaired/Different from baseline Area of Impairment: Safety/judgement;Awareness;Problem solving;Following commands;Attention                 Orientation Level: Disoriented to;Place (unable to problem solve location despite 3 choices being provided) Current Attention Level: Sustained   Following Commands: Follows one step commands consistently;Follows multi-step commands with increased time Safety/Judgement: Decreased awareness of safety;Decreased awareness of deficits Awareness: Emergent;Intellectual Problem Solving: Slow processing;Requires verbal cues;Requires tactile cues;Difficulty sequencing;Decreased initiation General Comments: pt much more conversant during session ablel to state name and following commands. pt continues to be slow to process requiring multimodal cues to attend to R side but noted improvement in comparision to previous sessions.        Exercises General Exercises - Upper Extremity Elbow Flexion: PROM;15 reps;Seated;Right Elbow Extension: PROM;Right;10 reps;Seated Wrist Flexion: PROM;Right;10  reps;Seated Wrist Extension: PROM;Right;10 reps;Seated   Shoulder Instructions       General Comments      Pertinent Vitals/ Pain       Pain Assessment: Faces Faces Pain Scale: Hurts little more Pain Location: R Hip with positioning Pain Descriptors / Indicators: Discomfort;Grimacing;Guarding;Moaning Pain Intervention(s): Limited activity within patient's tolerance;Monitored during session;Repositioned  Home Living                                          Prior Functioning/Environment              Frequency  Min 2X/week        Progress Toward Goals  OT Goals(current goals can now be found in the care plan section)  Progress towards OT goals: Progressing toward goals  Acute Rehab OT Goals Patient Stated Goal: to get better OT Goal Formulation: Patient unable to participate in goal setting Time For Goal Achievement: 07/23/20 Potential to Achieve Goals: Good  Plan Discharge plan remains appropriate;Frequency remains appropriate    Co-evaluation      Reason for Co-Treatment: Complexity of the patient's impairments (multi-system involvement);For patient/therapist safety   OT goals addressed during session: ADL's and self-care;Strengthening/ROM      AM-PAC OT "6 Clicks" Daily Activity     Outcome Measure   Help from another person eating meals?: A Lot Help from another person taking care of personal grooming?: A Lot Help from another person toileting, which includes using toliet, bedpan, or urinal?: Total Help  from another person bathing (including washing, rinsing, drying)?: Total Help from another person to put on and taking off regular upper body clothing?: Total Help from another person to put on and taking off regular lower body clothing?: Total 6 Click Score: 8    End of Session Equipment Utilized During Treatment: Gait belt;Other (comment) (stedy)  OT Visit Diagnosis: Unsteadiness on feet (R26.81);Muscle weakness (generalized)  (M62.81) Hemiplegia - Right/Left: Right Hemiplegia - dominant/non-dominant: Dominant   Activity Tolerance Patient tolerated treatment well   Patient Left in chair;with call bell/phone within reach;with chair alarm set;Other (comment) (alarm belt activivated)   Nurse Communication Mobility status;Other (comment) (permission to doff mitts)        Time: [phone] OT Time Calculation (min): 36 min  Charges: OT General Charges $OT Visit: 1 Visit OT Treatments $Self Care/Home Management : 8-22 mins  Audery Amel., COTA/L Acute Rehabilitation Services [phone]    Angelina Pih 07/13/2020, 11:44 AM

## 2020-07-13 NOTE — Progress Notes (Signed)
[facility] PROGRESS NOTE  Alan Everett XIH:[id number] DOB: 1956-02-24 DOA: 05/05/2020 PCP: Patient, No Pcp Per  Status: Remains inpatient appropriate because:Altered mental status and Unsafe d/c plan   Dispo: The patient is from: Home              Anticipated d/c is to: SNF              Anticipated d/c date is: > 3 days              Patient currently is medically stable to d/c.  Barriers to discharge: Medicaid approval pending.  No SNF bed offer.    Code Status: Full Family Communication: Daughter 12/6 DVT prophylaxis: SCDs Vaccination status: Unknown  Foley catheter: No   HPI: 64 year old with history of iron deficiency anemia, history of polysubstance abuse (alcohol, cocaine and tobacco).  Presented to ER by EMS on 10/6 as a code stroke.  Include right-sided weakness, sensory loss and aphasia.  CT of the head was possible for intraparenchymal hemorrhage.  Neurosurgery consulted but no acute surgical needs identified.  UDS positive for cocaine.  Hospitalization complicated by pneumonia and recurrent blood loss anemia but did not require endoscopic evaluation.  Patient currently awaiting placement in skilled nursing facility with Medicaid application pending patient with poor oral intake to require nocturnal feedings by PEG tube.  Subjective: Patient awake.  Smiling as therapy enters the room to begin session.  More interactive today as compared to the past 2 days  Objective: Vitals:   07/12/20 2359 07/13/20 0403  BP: 117/61 128/64  Pulse: 98 96  Resp: 17 17  Temp: 98 F (36.7 C) 98 F (36.7 C)  SpO2: 98% 100%    Intake/Output Summary (Last 24 hours) at 07/13/2020 0723 Last data filed at 07/12/2020 1751 Gross per 24 hour  Intake 600 ml  Output 1850 ml  Net -1250 ml   Filed Weights   07/08/20 0500 07/11/20 0500 07/12/20 0500  Weight: 50.8 kg 51.7 kg 51.7 kg    Exam: General: Awake and alert.  Appropriately interactive. Pulmonary: Bilateral lung sounds clear  to auscultation, room air Cardiac: S1-S2, no tachycardia, no JVD, no peripheral edema Abdomen: Soft nontender nondistended, PEG tube in place for bolus tube feedings and medications, LBM 12/11 Neurological: Alert, attempting to interact with conversation.  Fluency about 50% of the time.  Able to state first name.  Unable to state year or location.  Continues to have right side paralysis and hemineglect.  Also has significant pain from right hip flexion contracture developing 2/2 hypertonicity    Assessment/Plan: Acute problems: Acute stroke/cerebral edema -Secondary to severe hypertension, alcohol and cocaine use -Evaluated by neurosurgery without any acute indications for surgery identified -Continues to have waxing and waning issues regarding communication in the context of global aphasia with predominant expressive aphasia -No antiplatelet therapy secondary to hemorrhagic etiology.  Normal LDL -Continue PT, OT, SLP  Moderate to severe dysphagia with associated severe protein calorie malnutrition -SLP has cleared for D3 diet but patient with poor oral intake therefore was continued on bolus tube feedings Nutrition Problem: Severe Malnutrition Etiology: social / environmental circumstances Signs/Symptoms: severe fat depletion,severe muscle depletion Interventions: Tube feeding Estimated body mass index is 18.4 kg/m as calculated from the following:   Height as of 04/09/20: 5\' 6"  (1.676 m).   Weight as of this encounter: 51.7 kg.   Acute on chronic anemia -Had symptomatic anemia requiring 6 units PRBC-current hemoglobin stable between 8 and 9 -GI consulted but no  indication for emergent endoscopy this admission -Patient had outpatient endoscopy in August 2021 with no significant findings on colonoscopy or EGD other than gastritis -Continue twice daily PPI  Hepatitis C with transaminitis/indeterminate lesions on CT -HCV antibody reactive with a quantitative viral load of 792,000 -We  will need need eventual OP follow-up with either GI or infectious disease -Due to protracted length of stay will go ahead and obtain MRI abdomen with and without contrast to better quantify and seen on previous imaging -LFTs last checked 11/5 remain mildly elevated without an obstructive pattern-repeat labs soon  Stage II buttocks decubitus Wound / Incision (Open or Dehisced) 05/21/20 Puncture Abdomen Left;Anterior;Upper G-tube insertion site  (Active)  Date First Assessed/Time First Assessed: 05/21/20 1533   Wound Type: Puncture  Location: Abdomen  Location Orientation: Left;Anterior;Upper  Wound Description (Comments): G-tube insertion site   Present on Admission: No    Assessments 05/21/2020  3:34 PM 07/12/2020  8:00 PM  Dressing Type Gauze (Comment);Tape dressing --  Dressing Changed New --  Dressing Status Clean;Dry;Intact Clean;Dry;Intact  Dressing Change Frequency PRN --  Site / Wound Assessment Clean;Dry Clean;Dry  Peri-wound Assessment Intact Intact  Closure None --  Drainage Amount None --  Treatment Cleansed --     No Linked orders to display     Pressure Injury 06/02/20 Buttocks Right Stage 2 -  Partial thickness loss of dermis presenting as a shallow open injury with a red, pink wound bed without slough. 7.5 cm in lenth by 4.5 width  (Active)  Date First Assessed/Time First Assessed: 06/02/20 0800   Location: Buttocks  Location Orientation: Right  Staging: Stage 2 -  Partial thickness loss of dermis presenting as a shallow open injury with a red, pink wound bed without slough.  Wound Descri...    Assessments 06/02/2020  8:00 AM 07/12/2020  8:00 PM  Dressing Type -- Foam - Lift dressing to assess site every shift  Dressing -- Intact  Wound Length (cm) 7.5 cm --  Wound Width (cm) 4.5 cm --  Wound Surface Area (cm^2) 33.75 cm^2 --  Margins -- Attached edges (approximated)  Drainage Amount None None  Treatment Other (Comment) Other (Comment)     No Linked orders to display     Other problems: Goals of care  Bibasilar pneumonia-resolved Klebsiella pneumonia-resolved Sinus tachycardia-resolved  Essential hypertension/grade 1 diastolic dysfunction -Continue carvedilol  Hyponatremia-resolved Poly-substance abuse (alcohol, tobacco, cocaine)  GERD/gastritis per endoscopy August 2021 -Continue twice daily PPI as above  Data Reviewed: Basic Metabolic Panel: Recent Labs  Lab 07/10/20 0035  NA 134*  K 3.3*  CL 102  CO2 24  GLUCOSE 136*  BUN 19  CREATININE 0.49*  CALCIUM 9.1   Liver Function Tests: No results for input(s): AST, ALT, ALKPHOS, BILITOT, PROT, ALBUMIN in the last 168 hours. No results for input(s): LIPASE, AMYLASE in the last 168 hours. No results for input(s): AMMONIA in the last 168 hours. CBC: Recent Labs  Lab 07/10/20 0035  WBC 7.3  HGB 8.6*  HCT 27.4*  MCV 89.5  PLT 331   Cardiac Enzymes: No results for input(s): CKTOTAL, CKMB, CKMBINDEX, TROPONINI in the last 168 hours. BNP (last 3 results) Recent Labs    05/29/20 0409  BNP 23.2    ProBNP (last 3 results) No results for input(s): PROBNP in the last 8760 hours.  CBG: Recent Labs  Lab 07/12/20 1143 07/12/20 1614 07/12/20 2012 07/12/20 2358 07/13/20 0404  GLUCAP 134* 113* 143* 136* 153*    No results  found for this or any previous visit (from the past 240 hour(s)).   Studies: No results found.  Scheduled Meds: . carvedilol  6.25 mg Per Tube BID WC  . chlorhexidine  15 mL Mouth Rinse BID  . feeding supplement  237 mL Oral TID BM  . feeding supplement (OSMOLITE 1.5 CAL)  840 mL Per Tube Q24H  . feeding supplement (PROSource TF)  45 mL Per Tube TID  . free water  200 mL Per Tube Q4H  . insulin aspart  0-9 Units Subcutaneous Q4H  . insulin glargine  5 Units Subcutaneous Daily  . mouth rinse  15 mL Mouth Rinse q12n4p  . nutrition supplement (JUVEN)  1 packet Per Tube BID BM  . pantoprazole sodium  40 mg Per Tube BID  . sennosides  5 mL Per Tube BID    Continuous Infusions:  Principal Problem:   ICH (intracerebral hemorrhage) (HCC) Active Problems:   Polysubstance abuse (HCC)   Dysphagia due to recent cerebrovascular accident (CVA)   Hyperglycemia   Aspiration pneumonia of both lower lobes (HCC)   Palliative care by specialist   Goals of care, counseling/discussion   DNR (do not resuscitate) discussion   Protein-calorie malnutrition, severe   Heme positive stool   Acute lower UTI   Acute blood loss anemia   Pressure injury of skin   Aphasia as late effect of cerebrovascular accident   Consultants:  Neurology  Gastroenterology  Neurosurgery  Procedures:  Echocardiogram  Core track trach tube  PEG tube    Antibiotics: Anti-infectives (From admission, onward)   Start     Dose/Rate Route Frequency Ordered Stop   05/28/20 0900  cefTRIAXone (ROCEPHIN) 1 g in sodium chloride 0.9 % 100 mL IVPB        1 g 200 mL/hr over 30 Minutes Intravenous Every 24 hours 05/28/20 0807 06/01/20 0855   05/21/20 1515  ceFAZolin (ANCEF) IVPB 2g/100 mL premix        2 g 200 mL/hr over 30 Minutes Intravenous To Radiology 05/21/20 1429 05/21/20 2100   05/14/20 1000  cefTRIAXone (ROCEPHIN) 2 g in sodium chloride 0.9 % 100 mL IVPB        2 g 200 mL/hr over 30 Minutes Intravenous Daily 05/13/20 1454 05/15/20 1255   05/13/20 2200  metroNIDAZOLE (FLAGYL) tablet 500 mg        500 mg Per Tube Every 8 hours 05/13/20 1847 05/15/20 2219   05/13/20 1400  metroNIDAZOLE (FLAGYL) tablet 500 mg  Status:  Discontinued        500 mg Oral Every 8 hours 05/13/20 0935 05/13/20 1847   05/12/20 0900  cefTRIAXone (ROCEPHIN) 1 g in sodium chloride 0.9 % 100 mL IVPB  Status:  Discontinued        1 g 200 mL/hr over 30 Minutes Intravenous Daily 05/12/20 0817 05/13/20 1454   05/12/20 0900  azithromycin (ZITHROMAX) 500 mg in sodium chloride 0.9 % 250 mL IVPB  Status:  Discontinued        500 mg 250 mL/hr over 60 Minutes Intravenous Daily 05/12/20 0817 05/13/20  0935   05/09/20 0930  Ampicillin-Sulbactam (UNASYN) 3 g in sodium chloride 0.9 % 100 mL IVPB  Status:  Discontinued        3 g 200 mL/hr over 30 Minutes Intravenous Every 8 hours 05/09/20 0837 05/09/20 0839   05/09/20 0930  Ampicillin-Sulbactam (UNASYN) 3 g in sodium chloride 0.9 % 100 mL IVPB  Status:  Discontinued  3 g 200 mL/hr over 30 Minutes Intravenous Every 6 hours 05/09/20 0839 05/12/20 0817        Time spent: 20 minutes    Junious Silk ANP  [facility] Pager [phone].

## 2020-07-14 ENCOUNTER — Inpatient Hospital Stay (HOSPITAL_COMMUNITY): Payer: Medicaid Other

## 2020-07-14 LAB — COMPREHENSIVE METABOLIC PANEL
ALT: 81 U/L — ABNORMAL HIGH (ref 0–44)
AST: 65 U/L — ABNORMAL HIGH (ref 15–41)
Albumin: 2.6 g/dL — ABNORMAL LOW (ref 3.5–5.0)
Alkaline Phosphatase: 295 U/L — ABNORMAL HIGH (ref 38–126)
Anion gap: 10 (ref 5–15)
BUN: 14 mg/dL (ref 8–23)
CO2: 25 mmol/L (ref 22–32)
Calcium: 9.7 mg/dL (ref 8.9–10.3)
Chloride: 105 mmol/L (ref 98–111)
Creatinine, Ser: 0.6 mg/dL — ABNORMAL LOW (ref 0.61–1.24)
GFR, Estimated: >60 mL/min (ref >60)
Glucose, Bld: 91 mg/dL (ref 70–99)
Potassium: 3.5 mmol/L (ref 3.5–5.1)
Sodium: 140 mmol/L (ref 135–145)
Total Bilirubin: 0.5 mg/dL (ref 0.3–1.2)
Total Protein: 8.2 g/dL — ABNORMAL HIGH (ref 6.5–8.1)

## 2020-07-14 LAB — GLUCOSE, CAPILLARY
Glucose-Capillary: 106 mg/dL — ABNORMAL HIGH (ref 70–99)
Glucose-Capillary: 112 mg/dL — ABNORMAL HIGH (ref 70–99)
Glucose-Capillary: 130 mg/dL — ABNORMAL HIGH (ref 70–99)
Glucose-Capillary: 133 mg/dL — ABNORMAL HIGH (ref 70–99)
Glucose-Capillary: 141 mg/dL — ABNORMAL HIGH (ref 70–99)
Glucose-Capillary: 85 mg/dL (ref 70–99)
Glucose-Capillary: 97 mg/dL (ref 70–99)

## 2020-07-14 MED ORDER — GADOBUTROL 1 MMOL/ML IV SOLN
5.5000 mL | Freq: Once | INTRAVENOUS | Status: AC | PRN
  Administered 2020-07-14: 5.5 mL via INTRAVENOUS

## 2020-07-14 MED ORDER — TIZANIDINE HCL 4 MG PO TABS
2.0000 mg | ORAL_TABLET | Freq: Three times a day (TID) | ORAL | Status: DC
  Administered 2020-07-16: 22:00:00 2 mg
  Filled 2020-07-14: qty 1

## 2020-07-14 MED ORDER — TIZANIDINE HCL 4 MG PO TABS
2.0000 mg | ORAL_TABLET | Freq: Every day | ORAL | Status: AC
  Administered 2020-07-14 – 2020-07-15 (×2): 2 mg
  Filled 2020-07-14 (×2): qty 1

## 2020-07-14 NOTE — Progress Notes (Signed)
Occupational Therapy Treatment Patient Details Name: Alan Everett MRN: [id number] DOB: 02/11/1956 Today's Date: 07/14/2020    History of present illness Patient is a 64 y/o male with PMH ETOH use, tobacco abuse, admitted with R side weakness,sensory loss and speech deficits.  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   OT comments  Pt seen in conjunction with PT this session for patient/therapist safety and to facilitate functional/ADL transfers while maximizing pts activity tolerance. Pt continues to present with impaired balance sitting/ standing, cognitive deficits, R sided weakness and R sided neglect. Pt usually able to track to R with cues however noted decreased ability to track to R this session despite MAX multimodal cues and use of mirror. Session focus on functional mobility as precursor to higher level mobility tasks. Overall, pt requires MOD A +2 for sit<>stand with stedy, min guard for static sitting balance EOB  with LUE supported and MIN- MOD A +2 for static standing balance. Transferred pt to chair with use of stedy, however once pt in chair, pt noted to be sweaty BP taken 87/56 in chair, and with legs up 85/68. Pt also reporting needing to void bowels. Total A +2 for squat pivot transfer back to bed for pericare. Pt would continue to benefit from skilled occupational therapy while admitted and after d/c to address the below listed limitations in order to improve overall functional mobility and facilitate independence with BADL participation. DC plan remains appropriate, will follow acutely per POC.    Follow Up Recommendations  Supervision/Assistance - 24 hour;SNF    Equipment Recommendations  Wheelchair (measurements OT);Wheelchair cushion (measurements OT);Hospital bed;Other (comment)    Recommendations for Other Services      Precautions / Restrictions Precautions Precautions: Fall Precaution Comments: G tube,  right neglect, prevalon boot, monitor BP Required Braces or Orthoses: Splint/Cast;Other Brace Splint/Cast: soft pillow splint R UE at night; R hip extension splint at night Restrictions Weight Bearing Restrictions: No       Mobility Bed Mobility Overal bed mobility: Needs Assistance Bed Mobility: Sit to Supine;Supine to Sit     Supine to sit: Min assist;+2 for physical assistance;+2 for safety/equipment Sit to supine: Max assist;+2 for physical assistance;+2 for safety/equipment   General bed mobility comments: Cues for hand placement on L bed rails and to attempt to bring R leg off EOB, with min R leg activation noted. Pt able to grab bed rail and start to transition laterally in bed to ascend trunk to sit with minAx2 to manage trunk and R leg. MaxAx2 to return to supine after return transfer to bed due to drop in BP.  Transfers Overall transfer level: Needs assistance Equipment used: Ambulation equipment used Transfers: Sit to/from [employer] Transfers Sit to Stand: Mod assist;+2 physical assistance;Min assist Stand pivot transfers: Total assist;+2 safety/equipment;+2 physical assistance Squat pivot transfers: Total assist;+2 physical assistance;+2 safety/equipment     General transfer comment: Mod A +2 to power up into standing from EOB with cues at R knee to internally rotate leg and extend knee, allowing pt to push through R elbow on therapist's arm. Min A for power up from stedy seat. TA with use of stedy to transfer to chair and TA to transfer squat pivot back to bed when BP dropped.    Balance Overall balance assessment: Needs assistance Sitting-balance support: Single extremity supported;Feet supported Sitting balance-Leahy Scale: Poor Sitting balance - Comments: pt with improvement in balance able to static sit from  EOB and seat of stedy with intermittent min guard assist, pt continues to have impaired righting reactions therefore utilized  mirror for visual feedback to assist with safe sitting balance but pt would avoid looking in mirror despite cues. Pt displayed moment of resistance pushing posteriorly sitting EOB and thus requuired maxA at that time. Postural control: Right lateral lean;Posterior lean Standing balance support: Bilateral upper extremity supported Standing balance-Leahy Scale: Poor Standing balance comment: L UE placed on bar of stedy with tactile cues to extend knee and hips, min-modAx2 to maintain his balance x2 bouts of ~10-30 sec each, with improved midline positioning when cued to decrease R lateral lean but pt avoided looking in mirror for visual cues.                           ADL either performed or assessed with clinical judgement   ADL Overall ADL's : Needs assistance/impaired     Grooming: Wash/dry face;Sitting;Total assistance Grooming Details (indicate cue type and reason): total A to wash face from recliner with pt needing total A despite max multimodal cues                 Toilet Transfer: Total assistance;+2 for physical assistance;Squat-pivot Toilet Transfer Details (indicate cue type and reason): simualted via functional mobility with pt needing total A +2 for squat pivot transfer from Recliner>EOB         Functional mobility during ADLs: Total assistance;+2 for physical assistance (squat pivot transfer) General ADL Comments: pt limited by orthostatic hypotension this session with pt needing to return to supine post mobility     Vision       Perception     Praxis      Cognition Arousal/Alertness: Awake/alert Behavior During Therapy: WFL for tasks assessed/performed Overall Cognitive Status: Impaired/Different from baseline Area of Impairment: Safety/judgement;Awareness;Problem solving;Following commands;Memory;Attention                   Current Attention Level: Selective Memory: Decreased short-term memory Following Commands: Follows one step  commands inconsistently;Follows one step commands with increased time Safety/Judgement: Decreased awareness of safety;Decreased awareness of deficits Awareness: Emergent Problem Solving: Slow processing;Requires verbal cues;Requires tactile cues;Difficulty sequencing;Decreased initiation General Comments: Multi-modal cues provided to pt during session and repeated as needed. Slow processing with continued neglect of R side, requiring cues to remain attentive to R. Pt avoiding looking in mirror despite max cues this date, but noted improved recognition of finding and maintaining midline static sitting balance without visual cues this date. pt speaking intelligibly ~ 50% of session but did seem more in tune to deficits as pt noted to c/o of not feeling right once in recliner        Exercises Shoulder Exercises Elbow Flexion: PROM;Right;10 reps;Supine Elbow Extension: PROM;Right;10 reps;Supine Hand Exercises Wrist Flexion: PROM;Right;10 reps;Seated Wrist Extension: PROM;Right;10 reps;Seated   Shoulder Instructions       General Comments noted pt to be sweaty oncec chair, BP 87/56 in chair, and with legs up 85/68. notifed RN and assisted pt back to bed with total A +2    Pertinent Vitals/ Pain       Pain Assessment: Faces Faces Pain Scale: Hurts even more Pain Location: R hip with adduction and extension and knee with extension Pain Descriptors / Indicators: Discomfort;Grimacing;Guarding;Moaning Pain Intervention(s): Limited activity within patient's tolerance;Monitored during session;Repositioned  Home Living  Prior Functioning/Environment              Frequency  Min 2X/week        Progress Toward Goals  OT Goals(current goals can now be found in the care plan section)  Progress towards OT goals: Progressing toward goals  Acute Rehab OT Goals Patient Stated Goal: to get better OT Goal Formulation: Patient unable  to participate in goal setting Time For Goal Achievement: 07/23/20 Potential to Achieve Goals: Good  Plan Discharge plan remains appropriate;Frequency remains appropriate    Co-evaluation      Reason for Co-Treatment: Complexity of the patient's impairments (multi-system involvement);For patient/therapist safety;To address functional/ADL transfers;Necessary to address cognition/behavior during functional activity PT goals addressed during session: Mobility/safety with mobility;Balance;Proper use of DME OT goals addressed during session: ADL's and self-care;Strengthening/ROM      AM-PAC OT "6 Clicks" Daily Activity     Outcome Measure   Help from another person eating meals?: Total (NPO) Help from another person taking care of personal grooming?: A Lot Help from another person toileting, which includes using toliet, bedpan, or urinal?: Total Help from another person bathing (including washing, rinsing, drying)?: Total Help from another person to put on and taking off regular upper body clothing?: Total Help from another person to put on and taking off regular lower body clothing?: Total 6 Click Score: 7    End of Session Equipment Utilized During Treatment: Gait belt;Other (comment) (stedy)  OT Visit Diagnosis: Unsteadiness on feet (R26.81);Muscle weakness (generalized) (M62.81) Hemiplegia - Right/Left: Right Hemiplegia - dominant/non-dominant: Dominant   Activity Tolerance Patient tolerated treatment well   Patient Left in bed;with call bell/phone within reach;with nursing/sitter in room;Other (comment) (RN present assisting pt with pericare)   Nurse Communication Mobility status        Time: 7588-3254 OT Time Calculation (min): 34 min  Charges: OT General Charges $OT Visit: 1 Visit OT Treatments $Therapeutic Activity: 8-22 mins  Audery Amel., COTA/L Acute Rehabilitation Services [phone]    Angelina Pih 07/14/2020, 1:10 PM

## 2020-07-14 NOTE — Progress Notes (Signed)
[facility] PROGRESS NOTE  Patient: Alan Everett YHO:[id number]   PCP: Patient, No Pcp Per DOB: 1956-06-19   DOA: 05/05/2020   DOS: 07/14/2020    Subjective: No acute events.Repeating same question again again and despite on source, go to the same thoughts again.  Objective:  Vitals:   07/14/20 1147 07/14/20 1535  BP: 97/63 (P) 97/62  Pulse: 97 (P) 99  Resp: 16 (P) 16  Temp: 98 F (36.7 C) (P) 98.8 F (37.1 C)  SpO2: 100% (P) 100%    S1-S2 present. No edema. Bowel sounds present.  Assessment and plan: Management per note by NP. Agree with assessment and plan documented.  Author: Lynden Oxford, MD [facility] 07/14/2020 5:57 PM   If 7PM-7AM, please contact night-coverage at [email or web address]

## 2020-07-14 NOTE — Progress Notes (Signed)
Physical Therapy Treatment Patient Details Name: Alan Everett MRN: [id number] DOB: 19-Jul-1956 Today's Date: 07/14/2020    History of Present Illness Patient is a 64 y/o male with PMH ETOH use, tobacco abuse, admitted with R side weakness,sensory loss and speech deficits.  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    PT Comments    Treated pt in conjunction with OT this date to maximize quality of session and tolerance to activity. Pt able to transition supine > sit with minAx2 and extra time provided to respond to cues to sequence movement. Pt able to grab onto bed rails and initiate trunk ascension but required cues and assistance to manage his R leg as he continues to display minimal R side acknowledgement or active mobility. Pt displayed improved sitting balance, requiring only min guard assist at times but did displayed a bout of pushing posteriorly and therefore requiring maxA to maintain his safety during that time. Performed sit to stand to stedy with modAx2 and sit to stand from stedy with minAx2. Pt avoided looking in mirror to encourage midline balance this date. Pt's BP decreased, see General Comments below, and thus required TA to return pt to bed with a squat pivot transfer. RN made aware immediately and present to assist as needed for transfer to bed. Pt supine in bed with RN upon leaving room. Will continue to follow acutely. Current recommendations remain appropriate.   Follow Up Recommendations  SNF     Equipment Recommendations  Wheelchair (measurements PT);Wheelchair cushion (measurements PT);Hospital bed;3in1 (PT);Other (comment) (mechanical lift)    Recommendations for Other Services       Precautions / Restrictions Precautions Precautions: Fall Precaution Comments: G tube, right neglect, prevalon boot, monitor BP Required Braces or Orthoses: Splint/Cast;Other Brace Splint/Cast: soft pillow splint R UE at  night; R hip extension splint at night Restrictions Weight Bearing Restrictions: No    Mobility  Bed Mobility Overal bed mobility: Needs Assistance Bed Mobility: Rolling;Sit to Supine;Supine to Sit     Supine to sit: Min assist;+2 for physical assistance;+2 for safety/equipment Sit to supine: Max assist;+2 for physical assistance;+2 for safety/equipment   General bed mobility comments: Cues for hand placement on L bed rails and to attempt to bring R leg off EOB, with min R leg activation noted. Pt able to grab bed rail and start to transition laterally in bed to ascend trunk to sit with minAx2 to manage trunk and R leg. MaxAx2 to return to supine after return transfer to bed due to drop in BP.  Transfers Overall transfer level: Needs assistance Equipment used: Ambulation equipment used Transfers: Sit to/from [employer] Transfers Sit to Stand: Mod assist;+2 physical assistance;Min assist Stand pivot transfers: Total assist;+2 safety/equipment;+2 physical assistance Squat pivot transfers: Total assist;+2 physical assistance;+2 safety/equipment     General transfer comment: Mod A +2 to power up into standing from EOB with cues at R knee to internally rotate leg and extend knee, allowing pt to push through R elbow on therapist's arm. Min A for power up from stedy seat. TA with use of stedy to transfer to chair and TA to transfer squat pivot back to bed when BP dropped.  Ambulation/Gait             General Gait Details: Did not attempt this date   Stairs             Wheelchair Mobility    Modified Rankin (Stroke Patients Only)  Modified Rankin (Stroke Patients Only) Pre-Morbid Rankin Score: No symptoms Modified Rankin: Severe disability     Balance Overall balance assessment: Needs assistance Sitting-balance support: Single extremity supported;Feet supported Sitting balance-Leahy Scale: Poor Sitting balance - Comments: pt with  improvement in balance able to static sit from EOB and seat of stedy with intermittent min guard assist, pt continues to have impaired righting reactions therefore utilized mirror for visual feedback to assist with safe sitting balance but pt would avoid looking in mirror despite cues. Pt displayed moment of resistance pushing posteriorly sitting EOB and thus requuired maxA at that time. Postural control: Right lateral lean;Posterior lean Standing balance support: Bilateral upper extremity supported Standing balance-Leahy Scale: Poor Standing balance comment: L UE placed on bar of stedy with tactile cues to extend knee and hips, min-modAx2 to maintain his balance x2 bouts of ~10-30 sec each, with improved midline positioning when cued to decrease R lateral lean but pt avoided looking in mirror for visual cues.                            Cognition Arousal/Alertness: Awake/alert Behavior During Therapy: WFL for tasks assessed/performed Overall Cognitive Status: Impaired/Different from baseline Area of Impairment: Safety/judgement;Awareness;Problem solving;Following commands;Memory;Attention                   Current Attention Level: Selective Memory: Decreased short-term memory Following Commands: Follows one step commands inconsistently;Follows one step commands with increased time Safety/Judgement: Decreased awareness of safety;Decreased awareness of deficits Awareness: Emergent Problem Solving: Slow processing;Requires verbal cues;Requires tactile cues;Difficulty sequencing;Decreased initiation General Comments: Multi-modal cues provided to pt during session and repeated as needed. Slow processing with continued neglect of R side, requiring cues to remain attentive to R. Pt avoiding looking in mirror despite max cues this date, but noted improved recognition of finding and maintaining midline static sitting balance without visual cues this date.      Exercises       General Comments General comments (skin integrity, edema, etc.): Noted pt to be sweaty and cold to touch during transfer to chair, BP 87/56 in chair, thus reclined chair with BP 85/68, RN immediately notified who provided min guard to transfer pt back to supine in bed; Educated RN on necessity to donn splints at night and to position R hip into adduction with pillow to prevent/reduce contractures      Pertinent Vitals/Pain Pain Assessment: Faces Faces Pain Scale: Hurts even more Pain Location: R hip with adduction and extension and knee with extension Pain Descriptors / Indicators: Discomfort;Grimacing;Guarding;Moaning Pain Intervention(s): Limited activity within patient's tolerance;Monitored during session;Repositioned    Home Living                      Prior Function            PT Goals (current goals can now be found in the care plan section) Acute Rehab PT Goals Patient Stated Goal: to get better PT Goal Formulation: Patient unable to participate in goal setting Time For Goal Achievement: 07/16/20 Potential to Achieve Goals: Fair Progress towards PT goals: Progressing toward goals    Frequency    Min 3X/week      PT Plan Current plan remains appropriate    Co-evaluation PT/OT/SLP Co-Evaluation/Treatment: Yes Reason for Co-Treatment: Complexity of the patient's impairments (multi-system involvement);Necessary to address cognition/behavior during functional activity;For patient/therapist safety;To address functional/ADL transfers PT goals addressed during session: Mobility/safety with mobility;Balance;Proper use of  DME        AM-PAC PT "6 Clicks" Mobility   Outcome Measure  Help needed turning from your back to your side while in a flat bed without using bedrails?: A Lot Help needed moving from lying on your back to sitting on the side of a flat bed without using bedrails?: A Little Help needed moving to and from a bed to a chair (including a  wheelchair)?: Total Help needed standing up from a chair using your arms (e.g., wheelchair or bedside chair)?: A Lot Help needed to walk in hospital room?: Total Help needed climbing 3-5 steps with a railing? : Total 6 Click Score: 10    End of Session Equipment Utilized During Treatment: Gait belt Activity Tolerance: Patient tolerated treatment well Patient left: with call bell/phone within reach;with nursing/sitter in room;in bed Nurse Communication: Mobility status;Need for lift equipment;Other (comment) (BP measures and signs/symptoms) PT Visit Diagnosis: Unsteadiness on feet (R26.81);Muscle weakness (generalized) (M62.81);Difficulty in walking, not elsewhere classified (R26.2);Other symptoms and signs involving the nervous system (R29.898);Hemiplegia and hemiparesis Hemiplegia - Right/Left: Right Hemiplegia - dominant/non-dominant: Dominant Hemiplegia - caused by: Nontraumatic intracerebral hemorrhage     Time: 5284-1324 PT Time Calculation (min) (ACUTE ONLY): 33 min  Charges:  $Therapeutic Activity: 8-22 mins                     Raymond Gurney, PT, DPT Acute Rehabilitation Services  Pager: [phone] Office: [phone]    Jewel Baize 07/14/2020, 12:50 PM

## 2020-07-14 NOTE — Progress Notes (Signed)
CM attempted to reach significant other without success. VM left for patients niece concerning the Medicaid papers.

## 2020-07-14 NOTE — Progress Notes (Signed)
[facility] PROGRESS NOTE  Alan Everett WUJ:[id number] DOB: June 01, 1956 DOA: 05/05/2020 PCP: Patient, No Pcp Per  Status: Remains inpatient appropriate because:Altered mental status and Unsafe d/c plan   Dispo: The patient is from: Home              Anticipated d/c is to: SNF              Anticipated d/c date is: > 3 days              Patient currently is medically stable to d/c.  Barriers to discharge: Medicaid approval pending.  No SNF bed offer.    Code Status: Full Family Communication: Daughter 12/6 DVT prophylaxis: SCDs Vaccination status: Unknown  Foley catheter: No   HPI: 64 year old with history of iron deficiency anemia, history of polysubstance abuse (alcohol, cocaine and tobacco).  Presented to ER by EMS on 10/6 as a code stroke.  Include right-sided weakness, sensory loss and aphasia.  CT of the head was possible for intraparenchymal hemorrhage.  Neurosurgery consulted but no acute surgical needs identified.  UDS positive for cocaine.  Hospitalization complicated by pneumonia and recurrent blood loss anemia but did not require endoscopic evaluation.  Patient currently awaiting placement in skilled nursing facility with Medicaid application pending patient with poor oral intake to require nocturnal feedings by PEG tube.  Patient continues to have issues with waxing and waning speech which is primarily disfluent is also developing some significant hypertonicity and spasticity involving primarily the right lower extremity and hip which is causing pain as well as difficulty with mobilization.  A splint has been applied.  Patient has been started on baclofen and tizanidine.  May benefit from eventual Botox injection if persists over the long-term.  Dr. Riley Kill with rehabilitation medicine will evaluate the patient later this week or early next week  Subjective: Patient awake.  Smiles appropriately but speech completely disfluent and not connected to conversation at  hand.  Objective: Vitals:   07/14/20 0730 07/14/20 1147  BP: 133/79 97/63  Pulse: (!) 107 97  Resp: 16 16  Temp: 97.8 F (36.6 C) 98 F (36.7 C)  SpO2: 100% 100%   No intake or output data in the 24 hours ending 07/14/20 1230 Filed Weights   07/08/20 0500 07/11/20 0500 07/12/20 0500  Weight: 50.8 kg 51.7 kg 51.7 kg    Exam: General: Awake and alert.  Calm and in no distress Pulmonary: Bilateral lung sounds clear to auscultation, room air Cardiac: S1-S2, no tachycardia, no JVD, no peripheral edema-adequate capillary refill Abdomen: Soft nontender nondistended, PEG tube in place for bolus tube feedings and medications, LBM 12/14 Neurological: Alert, attempting to interact with conversation but speech pattern today is completely disfluent.  Also has significant pain from right hip flexion contracture developing 2/2 hypertonicity    Assessment/Plan: Acute problems: Acute stroke/cerebral edema -Secondary to severe hypertension, alcohol and cocaine use -Evaluated by neurosurgery without any acute indications for surgery identified -Continues to have waxing and waning issues regarding communication in the context of global aphasia with predominant expressive aphasia -No antiplatelet therapy secondary to hemorrhagic etiology.  Normal LDL -Continue PT, OT, SLP  Right lower extremity hypertonicity -Initiated during hospitalization and dose recently increased to 5 mg TID on 12/14 -Tizanidine 2 mg HS to begin on 12/15 -Rehabilitative medicine physician to evaluate the patient to determine if other pharmacological modalities are indicated -Continue splint is applied by OT -Over the long-term if does not improve may require Botox injection-patient is 64 days  out from initial presentation  Moderate to severe dysphagia with associated severe protein calorie malnutrition -SLP has cleared for D3 diet but patient with poor oral intake therefore was continued on bolus tube  feedings Nutrition Problem: Severe Malnutrition Etiology: social / environmental circumstances Signs/Symptoms: severe fat depletion,severe muscle depletion Interventions: Tube feeding Estimated body mass index is 18.4 kg/m as calculated from the following:   Height as of 04/09/20: 5\' 6"  (1.676 m).   Weight as of this encounter: 51.7 kg.   Acute on chronic anemia -Had symptomatic anemia requiring 6 units PRBC-current hemoglobin stable between 8 and 9 -GI consulted but no indication for emergent endoscopy this admission -Patient had outpatient endoscopy in August 2021 with no significant findings on colonoscopy or EGD other than gastritis -Continue twice daily PPI  Hepatitis C with transaminitis/indeterminate lesions on CT -HCV antibody reactive with a quantitative viral load of 792,000 -We will need need eventual OP follow-up with either GI or infectious disease -The been completed on 12/14 demonstrates 3 suspicious liver lesions without definitive signal characteristics.  The largest measures 3.4 cm in the anterior hepatic dome with metastatic disease not excluded close follow-up recommended and tissue sampling may be warranted. -Check AFP-consider GI consultation -LFTs last checked 11/5 remain mildly elevated without an obstructive pattern-repeat labs soon  Stage II buttocks decubitus Wound / Incision (Open or Dehisced) 05/21/20 Puncture Abdomen Left;Anterior;Upper G-tube insertion site  (Active)  Date First Assessed/Time First Assessed: 05/21/20 1533   Wound Type: Puncture  Location: Abdomen  Location Orientation: Left;Anterior;Upper  Wound Description (Comments): G-tube insertion site   Present on Admission: No    Assessments 05/21/2020  3:34 PM 07/12/2020  8:00 PM  Dressing Type Gauze (Comment);Tape dressing --  Dressing Changed New --  Dressing Status Clean;Dry;Intact Clean;Dry;Intact  Dressing Change Frequency PRN --  Site / Wound Assessment Clean;Dry Clean;Dry  Peri-wound  Assessment Intact Intact  Closure None --  Drainage Amount None --  Treatment Cleansed --     No Linked orders to display     Pressure Injury 06/02/20 Buttocks Right Stage 2 -  Partial thickness loss of dermis presenting as a shallow open injury with a red, pink wound bed without slough. 7.5 cm in lenth by 4.5 width  (Active)  Date First Assessed/Time First Assessed: 06/02/20 0800   Location: Buttocks  Location Orientation: Right  Staging: Stage 2 -  Partial thickness loss of dermis presenting as a shallow open injury with a red, pink wound bed without slough.  Wound Descri...    Assessments 06/02/2020  8:00 AM 07/07/2020 10:00 AM  Wound Length (cm) 7.5 cm 0 cm  Wound Width (cm) 4.5 cm 0 cm  Wound Depth (cm) -- 0 cm  Wound Surface Area (cm^2) 33.75 cm^2 0 cm^2  Wound Volume (cm^3) -- 0 cm^3  Drainage Amount None --  Treatment Other (Comment) --     No Linked orders to display    Other problems: Goals of care  Bibasilar pneumonia-resolved Klebsiella pneumonia-resolved Sinus tachycardia-resolved  Essential hypertension/grade 1 diastolic dysfunction -Continue carvedilol  Hyponatremia-resolved Poly-substance abuse (alcohol, tobacco, cocaine)  GERD/gastritis per endoscopy August 2021 -Continue twice daily PPI as above  Data Reviewed: Basic Metabolic Panel: Recent Labs  Lab 07/10/20 0035 07/14/20 0339  NA 134* 140  K 3.3* 3.5  CL 102 105  CO2 24 25  GLUCOSE 136* 91  BUN 19 14  CREATININE 0.49* 0.60*  CALCIUM 9.1 9.7   Liver Function Tests: Recent Labs  Lab 07/14/20 0339  AST  65*  ALT 81*  ALKPHOS 295*  BILITOT 0.5  PROT 8.2*  ALBUMIN 2.6*   No results for input(s): LIPASE, AMYLASE in the last 168 hours. No results for input(s): AMMONIA in the last 168 hours. CBC: Recent Labs  Lab 07/10/20 0035  WBC 7.3  HGB 8.6*  HCT 27.4*  MCV 89.5  PLT 331   Cardiac Enzymes: No results for input(s): CKTOTAL, CKMB, CKMBINDEX, TROPONINI in the last 168 hours. BNP  (last 3 results) Recent Labs    05/29/20 0409  BNP 23.2    ProBNP (last 3 results) No results for input(s): PROBNP in the last 8760 hours.  CBG: Recent Labs  Lab 07/13/20 1950 07/13/20 2356 07/14/20 0421 07/14/20 0729 07/14/20 1145  GLUCAP 87 85 97 106* 130*    No results found for this or any previous visit (from the past 240 hour(s)).   Studies: MR ABDOMEN W WO CONTRAST  Result Date: 07/14/2020 CLINICAL DATA:  Liver lesion. EXAM: MRI ABDOMEN WITHOUT AND WITH CONTRAST TECHNIQUE: Multiplanar multisequence MR imaging of the abdomen was performed both before and after the administration of intravenous contrast. CONTRAST:  5.54mL GADAVIST GADOBUTROL 1 MMOL/ML IV SOLN COMPARISON:  CT scan 05/27/2020 FINDINGS: Study is markedly motion degraded as patient was unable to follow breathing instructions. Lower chest: Unremarkable. Hepatobiliary: 3.4 cm lesion is identified in the anterior hepatic dome near the junction of segments IV and VIII (see T2 axial image 8/series 8). Lesion has low signal intensity on precontrast T1 imaging, mildly increased T2 signal intensity and restricted diffusion. After IV contrast administration, there is heterogeneous arterial phase hyperenhancement. Washout is difficult to assess given the marked motion degradation, but delayed subtraction imaging suggests that the lesion retains contrast to a greater degree than background liver parenchyma (image 22 of subtraction delayed series 22). Additional lesions with similar signal characteristics are identified in the lateral segment left liver measuring 2.2 cm on image 11/series 8. A third small lesion in the inferior tip of the right liver measures 10 mm on image 25/series 8. No overt MR findings of cirrhosis although motion degradation hinders assessment. 11 mm gallstone evident. No intra or extrahepatic biliary duct dilatation. Pancreas: No focal mass lesion. No dilatation of the main duct. No intraparenchymal cyst. No  peripancreatic edema. Imaging in the pancreas on postcontrast imaging is nondiagnostic due to motion artifact. Spleen:  No gross abnormality. Adrenals/Urinary Tract: No adrenal nodule or mass. Kidneys unremarkable on precontrast imaging. Evaluation of kidneys on postcontrast imaging is nondiagnostic due to motion. Stomach/Bowel: Stomach is unremarkable. No gastric wall thickening. No evidence of outlet obstruction. Duodenum is normally positioned as is the ligament of Treitz. No small bowel or colonic dilatation within the visualized abdomen. Vascular/Lymphatic: No abdominal aortic aneurysm. Upper normal lymph nodes are seen in the hepatoduodenal ligament. Other: No intraperitoneal free fluid. Bladder is visualized on coronal haste imaging and appears distended with bladder wall trabeculation. 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 No focal suspicious marrow enhancement within the visualized bony anatomy. IMPRESSION: 1. Markedly motion limited exam shows 3 suspicious liver lesions without definitive signal characteristics. The largest measures 3.4 cm in the anterior hepatic dome. Metastatic disease is not excluded. Close follow-up recommended and tissue sampling may be warranted. 2. 11 mm gallstone. No intra or extrahepatic biliary duct dilatation. 3. Distended urinary bladder with bladder wall trabeculation. Imaging features suggest chronic bladder outlet obstruction. Electronically Signed   By: Kennith Center M.D.   On: 07/14/2020 05:32  Scheduled Meds: . baclofen  5 mg Per Tube TID  . carvedilol  6.25 mg Per Tube BID WC  . chlorhexidine  15 mL Mouth Rinse BID  . feeding supplement  237 mL Oral TID BM  . feeding supplement (OSMOLITE 1.5 CAL)  840 mL Per Tube Q24H  . feeding supplement (PROSource TF)  45 mL Per Tube TID  . free water  200 mL Per Tube Q4H  . insulin aspart  0-9 Units Subcutaneous Q4H  . insulin glargine  5 Units Subcutaneous Daily  . mouth rinse  15  mL Mouth Rinse q12n4p  . nutrition supplement (JUVEN)  1 packet Per Tube BID BM  . pantoprazole sodium  40 mg Per Tube BID  . sennosides  5 mL Per Tube BID  . tiZANidine  2 mg Per Tube QHS   Followed by  . [START ON 07/16/2020] tiZANidine  2 mg Per Tube TID   Continuous Infusions:  Principal Problem:   ICH (intracerebral hemorrhage) (HCC) Active Problems:   Polysubstance abuse (HCC)   Dysphagia due to recent cerebrovascular accident (CVA)   Hyperglycemia   Aspiration pneumonia of both lower lobes (HCC)   Palliative care by specialist   Goals of care, counseling/discussion   DNR (do not resuscitate) discussion   Protein-calorie malnutrition, severe   Heme positive stool   Acute lower UTI   Acute blood loss anemia   Pressure injury of skin   Aphasia as late effect of cerebrovascular accident   Hepatitis C   Lesion of liver   Consultants:  Neurology  Gastroenterology  Neurosurgery  Procedures:  Echocardiogram  Core track trach tube  PEG tube    Antibiotics: Anti-infectives (From admission, onward)   Start     Dose/Rate Route Frequency Ordered Stop   05/28/20 0900  cefTRIAXone (ROCEPHIN) 1 g in sodium chloride 0.9 % 100 mL IVPB        1 g 200 mL/hr over 30 Minutes Intravenous Every 24 hours 05/28/20 0807 06/01/20 0855   05/21/20 1515  ceFAZolin (ANCEF) IVPB 2g/100 mL premix        2 g 200 mL/hr over 30 Minutes Intravenous To Radiology 05/21/20 1429 05/21/20 2100   05/14/20 1000  cefTRIAXone (ROCEPHIN) 2 g in sodium chloride 0.9 % 100 mL IVPB        2 g 200 mL/hr over 30 Minutes Intravenous Daily 05/13/20 1454 05/15/20 1255   05/13/20 2200  metroNIDAZOLE (FLAGYL) tablet 500 mg        500 mg Per Tube Every 8 hours 05/13/20 1847 05/15/20 2219   05/13/20 1400  metroNIDAZOLE (FLAGYL) tablet 500 mg  Status:  Discontinued        500 mg Oral Every 8 hours 05/13/20 0935 05/13/20 1847   05/12/20 0900  cefTRIAXone (ROCEPHIN) 1 g in sodium chloride 0.9 % 100 mL IVPB   Status:  Discontinued        1 g 200 mL/hr over 30 Minutes Intravenous Daily 05/12/20 0817 05/13/20 1454   05/12/20 0900  azithromycin (ZITHROMAX) 500 mg in sodium chloride 0.9 % 250 mL IVPB  Status:  Discontinued        500 mg 250 mL/hr over 60 Minutes Intravenous Daily 05/12/20 0817 05/13/20 0935   05/09/20 0930  Ampicillin-Sulbactam (UNASYN) 3 g in sodium chloride 0.9 % 100 mL IVPB  Status:  Discontinued        3 g 200 mL/hr over 30 Minutes Intravenous Every 8 hours 05/09/20 0837 05/09/20 0839  05/09/20 0930  Ampicillin-Sulbactam (UNASYN) 3 g in sodium chloride 0.9 % 100 mL IVPB  Status:  Discontinued        3 g 200 mL/hr over 30 Minutes Intravenous Every 6 hours 05/09/20 0839 05/12/20 0817       Time spent: 20 minutes    Junious Silk ANP  [facility] Pager [phone].

## 2020-07-15 LAB — GLUCOSE, CAPILLARY
Glucose-Capillary: 112 mg/dL — ABNORMAL HIGH (ref 70–99)
Glucose-Capillary: 143 mg/dL — ABNORMAL HIGH (ref 70–99)
Glucose-Capillary: 145 mg/dL — ABNORMAL HIGH (ref 70–99)
Glucose-Capillary: 149 mg/dL — ABNORMAL HIGH (ref 70–99)
Glucose-Capillary: 150 mg/dL — ABNORMAL HIGH (ref 70–99)
Glucose-Capillary: 95 mg/dL (ref 70–99)
Glucose-Capillary: 99 mg/dL (ref 70–99)

## 2020-07-15 MED ORDER — BENZONATATE 100 MG PO CAPS: 100.0000 mg | ORAL_CAPSULE | Freq: Three times a day (TID) | ORAL | Status: DC | PRN

## 2020-07-15 NOTE — Progress Notes (Addendum)
[facility] PROGRESS NOTE  Alan SierrasJiles Everett ZOX:[id number] DOB: Oct 29, 1955 DOA: 05/05/2020 PCP: Patient, No Pcp Per  Status: Remains inpatient appropriate because:Altered mental status and Unsafe d/c plan   Dispo: The patient is from: Home              Anticipated d/c is to: SNF              Anticipated d/c date is: > 3 days              Patient currently is medically stable to d/c.  Barriers to discharge: Medicaid approval pending.  No SNF bed offer.    Code Status: Full Family Communication: Daughter 12/6 DVT prophylaxis: SCDs Vaccination status: Unknown  Foley catheter: No   HPI: 64 year old with history of iron deficiency anemia, history of polysubstance abuse (alcohol, cocaine and tobacco).  Presented to ER by EMS on 10/6 as a code stroke.  Include right-sided weakness, sensory loss and aphasia.  CT of the head was possible for intraparenchymal hemorrhage.  Neurosurgery consulted but no acute surgical needs identified.  UDS positive for cocaine.  Hospitalization complicated by pneumonia and recurrent blood loss anemia but did not require endoscopic evaluation.  Patient currently awaiting placement in skilled nursing facility with Medicaid application pending patient with poor oral intake to require nocturnal feedings by PEG tube.  Patient continues to have issues with waxing and waning speech which is primarily disfluent is also developing some significant hypertonicity and spasticity involving primarily the right lower extremity and hip which is causing pain as well as difficulty with mobilization.  A splint has been applied.  Patient has been started on baclofen and tizanidine.  May benefit from eventual Botox injection if persists over the long-term.  Dr. Riley KillSwartz with rehabilitation medicine will evaluate the patient later this week or early next week  Subjective: Awake.  Spontaneously verbalizing with only one phrase that was intelligible.  He spontaneously stated "I feel  good"  Objective: Vitals:   07/15/20 0341 07/15/20 0751  BP: 119/68 120/74  Pulse: 91 91  Resp: 16 14  Temp: 98.7 F (37.1 C) 98.1 F (36.7 C)  SpO2: 100% 100%    Intake/Output Summary (Last 24 hours) at 07/15/2020 1024 Last data filed at 07/15/2020 0800 Gross per 24 hour  Intake 120 ml  Output 200 ml  Net -80 ml   Filed Weights   07/08/20 0500 07/11/20 0500 07/12/20 0500  Weight: 50.8 kg 51.7 kg 51.7 kg    Exam: General: Awake and alert.  Calm, no acute distress Pulmonary: Bilateral lung sounds clear to auscultation, room air Cardiac: S1-S2, no tachycardia, no JVD, no peripheral edema-adequate capillary refill Abdomen: Soft nontender nondistended, PEG tube in place for bolus tube feedings and medications, LBM 12/15 Neurological: Alert, attempting to interact with conversation but speech pattern today is completely disfluent with evidence of both receptive and expressive aphasia today.  Also has significant pain from right hip flexion contracture developing 2/2 hypertonicity    Assessment/Plan: Acute problems: Acute stroke/cerebral edema -Secondary to severe hypertension, alcohol and cocaine use -Evaluated by neurosurgery without any acute indications for surgery identified -Continues to have waxing and waning issues regarding communication in the context of global aphasia  -No antiplatelet therapy secondary to hemorrhagic etiology.  Normal LDL -Continue PT, OT, SLP  Right lower extremity hypertonicity -Continue baclofen 5 mg TID  -Continue tizanidine 2 mg HS  -Rehabilitative medicine physician to evaluate the patient to determine if other pharmacological modalities are indicated -Continue splint  as applied by OT -Over the long-term if does not improve may require Botox injection-patient is >64 days out from initial presentation  Mild hypomagnesemia -Magnesium level 1.6 -Replace with 2 g IV -Repeat level in a.m.  Moderate to severe dysphagia with associated  severe protein calorie malnutrition -SLP has cleared for D3 diet but patient with poor oral intake therefore was continued on bolus tube feedings Nutrition Problem: Severe Malnutrition Etiology: social / environmental circumstances Signs/Symptoms: severe fat depletion,severe muscle depletion Interventions: Tube feeding Estimated body mass index is 18.4 kg/m as calculated from the following:   Height as of 04/09/20:  (1.676 m).   Weight as of this encounter: 51.7 kg.   Acute on chronic anemia -Had symptomatic anemia requiring 6 units PRBC-current hemoglobin stable between 8 and 9 -GI consulted but no indication for emergent endoscopy this admission -Patient had outpatient endoscopy in August 2021 with no significant findings on colonoscopy or EGD other than gastritis -Continue twice daily PPI  Hepatitis C with transaminitis/indeterminate lesions on CT -HCV antibody reactive with a quantitative viral load of 792,000 -We will need need eventual OP follow-up with either GI or infectious disease -The been completed on 12/14 demonstrates 3 suspicious liver lesions without definitive signal characteristics.  The largest measures 3.4 cm in the anterior hepatic dome with metastatic disease not excluded close follow-up recommended and tissue sampling may be warranted. -Check AFP; clinically asymptomatic Mild transaminitis which is nonobstructive -LFTs last checked 11/5 remain mildly elevated without an obstructive pattern-repeat labs soon  Stage II buttocks decubitus Wound / Incision (Open or Dehisced) 05/21/20 Puncture Abdomen Left;Anterior;Upper G-tube insertion site  (Active)  Date First Assessed/Time First Assessed: 05/21/20 1533   Wound Type: Puncture  Location: Abdomen  Location Orientation: Left;Anterior;Upper  Wound Description (Comments): G-tube insertion site   Present on Admission: No    Assessments 05/21/2020  3:34 PM 07/14/2020  8:00 PM  Dressing Type Gauze (Comment);Tape  dressing --  Dressing Changed New --  Dressing Status Clean;Dry;Intact Clean;Dry;Intact  Dressing Change Frequency PRN --  Site / Wound Assessment Clean;Dry --  Peri-wound Assessment Intact --  Margins -- Attached edges (approximated)  Closure None --  Drainage Amount None None  Treatment Cleansed --     No Linked orders to display     Pressure Injury 06/02/20 Buttocks Right Stage 2 -  Partial thickness loss of dermis presenting as a shallow open injury with a red, pink wound bed without slough. 7.5 cm in lenth by 4.5 width  (Active)  Date First Assessed/Time First Assessed: 06/02/20 0800   Location: Buttocks  Location Orientation: Right  Staging: Stage 2 -  Partial thickness loss of dermis presenting as a shallow open injury with a red, pink wound bed without slough.  Wound Descri...    Assessments 06/02/2020  8:00 AM 07/07/2020 10:00 AM  Wound Length (cm) 7.5 cm 0 cm  Wound Width (cm) 4.5 cm 0 cm  Wound Depth (cm) -- 0 cm  Wound Surface Area (cm^2) 33.75 cm^2 0 cm^2  Wound Volume (cm^3) -- 0 cm^3  Drainage Amount None --  Treatment Other (Comment) --     No Linked orders to display    Other problems: Goals of care  Bibasilar pneumonia-resolved Klebsiella pneumonia-resolved Sinus tachycardia-resolved  Essential hypertension/grade 1 diastolic dysfunction -Continue carvedilol  Hyponatremia-resolved Poly-substance abuse (alcohol, tobacco, cocaine)  GERD/gastritis per endoscopy August 2021 -Continue twice daily PPI as above  Data Reviewed: Basic Metabolic Panel: Recent Labs  Lab 07/10/20 0035 07/14/20 0339  NA 134* 140  K 3.3* 3.5  CL 102 105  CO2 24 25  GLUCOSE 136* 91  BUN 19 14  CREATININE 0.49* 0.60*  CALCIUM 9.1 9.7   Liver Function Tests: Recent Labs  Lab 07/14/20 0339  AST 65*  ALT 81*  ALKPHOS 295*  BILITOT 0.5  PROT 8.2*  ALBUMIN 2.6*   No results for input(s): LIPASE, AMYLASE in the last 168 hours. No results for input(s): AMMONIA in the last  168 hours. CBC: Recent Labs  Lab 07/10/20 0035  WBC 7.3  HGB 8.6*  HCT 27.4*  MCV 89.5  PLT 331   Cardiac Enzymes: No results for input(s): CKTOTAL, CKMB, CKMBINDEX, TROPONINI in the last 168 hours. BNP (last 3 results) Recent Labs    05/29/20 0409  BNP 23.2    ProBNP (last 3 results) No results for input(s): PROBNP in the last 8760 hours.  CBG: Recent Labs  Lab 07/14/20 2105 07/14/20 2333 07/15/20 0342 07/15/20 0531 07/15/20 0748  GLUCAP 141* 133* 150* 149* 99    No results found for this or any previous visit (from the past 240 hour(s)).   Studies: MR ABDOMEN W WO CONTRAST  Result Date: 07/14/2020 CLINICAL DATA:  Liver lesion. EXAM: MRI ABDOMEN WITHOUT AND WITH CONTRAST TECHNIQUE: Multiplanar multisequence MR imaging of the abdomen was performed both before and after the administration of intravenous contrast. CONTRAST:  5.51mL GADAVIST GADOBUTROL 1 MMOL/ML IV SOLN COMPARISON:  CT scan 05/27/2020 FINDINGS: Study is markedly motion degraded as patient was unable to follow breathing instructions. Lower chest: Unremarkable. Hepatobiliary: 3.4 cm lesion is identified in the anterior hepatic dome near the junction of segments IV and VIII (see T2 axial image 8/series 8). Lesion has low signal intensity on precontrast T1 imaging, mildly increased T2 signal intensity and restricted diffusion. After IV contrast administration, there is heterogeneous arterial phase hyperenhancement. Washout is difficult to assess given the marked motion degradation, but delayed subtraction imaging suggests that the lesion retains contrast to a greater degree than background liver parenchyma (image 22 of subtraction delayed series 22). Additional lesions with similar signal characteristics are identified in the lateral segment left liver measuring 2.2 cm on image 11/series 8. A third small lesion in the inferior tip of the right liver measures 10 mm on image 25/series 8. No overt MR findings of  cirrhosis although motion degradation hinders assessment. 11 mm gallstone evident. No intra or extrahepatic biliary duct dilatation. Pancreas: No focal mass lesion. No dilatation of the main duct. No intraparenchymal cyst. No peripancreatic edema. Imaging in the pancreas on postcontrast imaging is nondiagnostic due to motion artifact. Spleen:  No gross abnormality. Adrenals/Urinary Tract: No adrenal nodule or mass. Kidneys unremarkable on precontrast imaging. Evaluation of kidneys on postcontrast imaging is nondiagnostic due to motion. Stomach/Bowel: Stomach is unremarkable. No gastric wall thickening. No evidence of outlet obstruction. Duodenum is normally positioned as is the ligament of Treitz. No small bowel or colonic dilatation within the visualized abdomen. Vascular/Lymphatic: No abdominal aortic aneurysm. Upper normal lymph nodes are seen in the hepatoduodenal ligament. Other: No intraperitoneal free fluid. Bladder is visualized on coronal haste imaging and appears distended with bladder wall trabeculation. 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 No focal suspicious marrow enhancement within the visualized bony anatomy. IMPRESSION: 1. Markedly motion limited exam shows 3 suspicious liver lesions without definitive signal characteristics. The largest measures 3.4 cm in the anterior hepatic dome. Metastatic disease is not excluded. Close follow-up recommended and tissue sampling may be  warranted. 2. 11 mm gallstone. No intra or extrahepatic biliary duct dilatation. 3. Distended urinary bladder with bladder wall trabeculation. Imaging features suggest chronic bladder outlet obstruction. Electronically Signed   By: Kennith Center M.D.   On: 07/14/2020 05:32    Scheduled Meds: . baclofen  5 mg Per Tube TID  . carvedilol  6.25 mg Per Tube BID WC  . chlorhexidine  15 mL Mouth Rinse BID  . feeding supplement  237 mL Oral TID BM  . feeding supplement (OSMOLITE 1.5 CAL)  840  mL Per Tube Q24H  . feeding supplement (PROSource TF)  45 mL Per Tube TID  . free water  200 mL Per Tube Q4H  . insulin aspart  0-9 Units Subcutaneous Q4H  . insulin glargine  5 Units Subcutaneous Daily  . mouth rinse  15 mL Mouth Rinse q12n4p  . nutrition supplement (JUVEN)  1 packet Per Tube BID BM  . pantoprazole sodium  40 mg Per Tube BID  . sennosides  5 mL Per Tube BID  . tiZANidine  2 mg Per Tube QHS   Followed by  . [START ON 07/16/2020] tiZANidine  2 mg Per Tube TID   Continuous Infusions:  Principal Problem:   ICH (intracerebral hemorrhage) (HCC) Active Problems:   Polysubstance abuse (HCC)   Dysphagia due to recent cerebrovascular accident (CVA)   Hyperglycemia   Aspiration pneumonia of both lower lobes (HCC)   Palliative care by specialist   Goals of care, counseling/discussion   DNR (do not resuscitate) discussion   Protein-calorie malnutrition, severe   Heme positive stool   Acute lower UTI   Acute blood loss anemia   Pressure injury of skin   Aphasia as late effect of cerebrovascular accident   Hepatitis C   Lesion of liver   Consultants:  Neurology  Gastroenterology  Neurosurgery  Procedures:  Echocardiogram  Core track trach tube  PEG tube    Antibiotics: Anti-infectives (From admission, onward)   Start     Dose/Rate Route Frequency Ordered Stop   05/28/20 0900  cefTRIAXone (ROCEPHIN) 1 g in sodium chloride 0.9 % 100 mL IVPB        1 g 200 mL/hr over 30 Minutes Intravenous Every 24 hours 05/28/20 0807 06/01/20 0855   05/21/20 1515  ceFAZolin (ANCEF) IVPB 2g/100 mL premix        2 g 200 mL/hr over 30 Minutes Intravenous To Radiology 05/21/20 1429 05/21/20 2100   05/14/20 1000  cefTRIAXone (ROCEPHIN) 2 g in sodium chloride 0.9 % 100 mL IVPB        2 g 200 mL/hr over 30 Minutes Intravenous Daily 05/13/20 1454 05/15/20 1255   05/13/20 2200  metroNIDAZOLE (FLAGYL) tablet 500 mg        500 mg Per Tube Every 8 hours 05/13/20 1847 05/15/20  2219   05/13/20 1400  metroNIDAZOLE (FLAGYL) tablet 500 mg  Status:  Discontinued        500 mg Oral Every 8 hours 05/13/20 0935 05/13/20 1847   05/12/20 0900  cefTRIAXone (ROCEPHIN) 1 g in sodium chloride 0.9 % 100 mL IVPB  Status:  Discontinued        1 g 200 mL/hr over 30 Minutes Intravenous Daily 05/12/20 0817 05/13/20 1454   05/12/20 0900  azithromycin (ZITHROMAX) 500 mg in sodium chloride 0.9 % 250 mL IVPB  Status:  Discontinued        500 mg 250 mL/hr over 60 Minutes Intravenous Daily 05/12/20 0817 05/13/20 0935  05/09/20 0930  Ampicillin-Sulbactam (UNASYN) 3 g in sodium chloride 0.9 % 100 mL IVPB  Status:  Discontinued        3 g 200 mL/hr over 30 Minutes Intravenous Every 8 hours 05/09/20 0837 05/09/20 0839   05/09/20 0930  Ampicillin-Sulbactam (UNASYN) 3 g in sodium chloride 0.9 % 100 mL IVPB  Status:  Discontinued        3 g 200 mL/hr over 30 Minutes Intravenous Every 6 hours 05/09/20 0839 05/12/20 0817       Time spent: 20 minutes    Junious Silk ANP  [facility] Pager [phone].

## 2020-07-15 NOTE — Progress Notes (Signed)
  Speech Language Pathology Treatment: Cognitive-Linquistic;Dysphagia  Patient Details Name: Alan Everett MRN: [id number] DOB: 08-04-1955 Today's Date: 07/15/2020 Time: 3474-2595 SLP Time Calculation (min) (ACUTE ONLY): 23 min  Assessment / Plan / Recommendation Clinical Impression  Pt was seen for treatment with his significant other, [name], present. [name] reported that she brought food for the pt today and that he ate some of it without difficulty, but that she was told by staff that he could not have it due to it not being chopped finely enough. Upon examination, it appears that pt was NPO for a procedure earlier this week and then placed on a dysphagia 2 diet instead of a dysphagia 3 which was last recommended by one of his more consistent SLPs. Pt tolerated dysphagia 3 solids which were brought by the pt's significant other without notable difficulty or overt s/sx of aspiration and mastication was functional. Pt's diet was modified to that recommended on 11/23 of dysphagia 3 solids and thin liquids. Pt's family was shaving the pt upon SLP's arrival and he followed 1-step commands with intermittent with models and tactile cues. He produced fluent sentences with reduced meaning but some more meaningful utterances were also observed such as, "Me and Mary...making love", "I wanna sec the glasses" (when pointing to the glasses case), and "Lemme see some might be". Pt's communication was more meaningful during social contexts with responses of "Alright", "Okay then", "Hi" and "You ain' gon' do that".  Pt demonstrated more difficulty during structured tasks despite cueing. [name] was educated regarding the pt's impairments and plan of care. She stated that she was familiar with aphasia from working in a SNF and verbalized understanding. SLP will continue to follow pt.    HPI HPI: Alan Everett is a 64 y.o. male with a PMHx of alcohol use and tobacco abuse who presents acutely to the ED via EMS as a Code Stroke  for acute onset of right sided weakness. Patient with disoriented, right leg weakness, right limb ataxia, right decreased sensation and Global aphasia on exam. 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 Worsening of intraventricular extension.  MBS 10/19 mod-severe dysphagia, continue NPO. PEG 10/22.  Repeat MBS 10/30 with improvements. Started dys1/nectar 11/2, advanced to thin liquids with D1.      SLP Plan  Continue with current plan of care       Recommendations  Diet recommendations: Dysphagia 3 (mechanical soft);Thin liquid Liquids provided via: Cup;Straw Medication Administration: Whole meds with puree Supervision: Staff to assist with self feeding;Full supervision/cueing for compensatory strategies Compensations: Minimize environmental distractions;Monitor for anterior loss;Small sips/bites;Slow rate;Follow solids with liquid Postural Changes and/or Swallow Maneuvers: Seated upright 90 degrees                Oral Care Recommendations: Oral care BID Follow up Recommendations: Skilled Nursing facility;Inpatient Rehab SLP Visit Diagnosis: Dysphagia, oropharyngeal phase (R13.12);Aphasia (R47.01) Plan: Continue with current plan of care       Alan Gallo I. Vear Clock, MS, CCC-SLP Acute Rehabilitation Services Office number [phone] Pager [phone]                 Scheryl Marten 07/15/2020, 2:26 PM

## 2020-07-15 NOTE — Plan of Care (Signed)
  Problem: Education: Goal: Knowledge of General Education information will improve Description: Including pain rating scale, medication(s)/side effects and non-pharmacologic comfort measures Outcome: Progressing   Problem: Health Behavior/Discharge Planning: Goal: Ability to manage health-related needs will improve Outcome: Progressing   Problem: Clinical Measurements: Goal: Ability to maintain clinical measurements within normal limits will improve Outcome: Progressing Goal: Will remain free from infection Outcome: Progressing Goal: Diagnostic test results will improve Outcome: Progressing Goal: Respiratory complications will improve Outcome: Progressing Goal: Cardiovascular complication will be avoided Outcome: Progressing   Problem: Activity: Goal: Risk for activity intolerance will decrease Outcome: Progressing   Problem: Nutrition: Goal: Adequate nutrition will be maintained Outcome: Progressing   Problem: Coping: Goal: Level of anxiety will decrease Outcome: Progressing   Problem: Elimination: Goal: Will not experience complications related to bowel motility Outcome: Progressing Goal: Will not experience complications related to urinary retention Outcome: Progressing   Problem: Pain Managment: Goal: General experience of comfort will improve Outcome: Progressing   Problem: Safety: Goal: Ability to remain free from injury will improve Outcome: Progressing   Problem: Skin Integrity: Goal: Risk for impaired skin integrity will decrease Outcome: Progressing   Problem: Education: Goal: Knowledge of disease or condition will improve Outcome: Progressing Goal: Knowledge of secondary prevention will improve Outcome: Progressing Goal: Knowledge of patient specific risk factors addressed and post discharge goals established will improve Outcome: Progressing Goal: Individualized Educational Video(s) Outcome: Progressing   Problem: Coping: Goal: Will verbalize  positive feelings about self Outcome: Progressing Goal: Will identify appropriate support needs Outcome: Progressing   Problem: Health Behavior/Discharge Planning: Goal: Ability to manage health-related needs will improve Outcome: Progressing   Problem: Self-Care: Goal: Ability to participate in self-care as condition permits will improve Outcome: Progressing Goal: Verbalization of feelings and concerns over difficulty with self-care will improve Outcome: Progressing Goal: Ability to communicate needs accurately will improve Outcome: Progressing   Problem: Nutrition: Goal: Risk of aspiration will decrease Outcome: Progressing Goal: Dietary intake will improve Outcome: Progressing   Problem: Intracerebral Hemorrhage Tissue Perfusion: Goal: Complications of Intracerebral Hemorrhage will be minimized Outcome: Progressing   

## 2020-07-15 NOTE — Progress Notes (Signed)
[facility] PROGRESS NOTE  Patient: Alan Everett QVZ:[id number]   PCP: Patient, No Pcp Per DOB: 10/04/1955   DOA: 05/05/2020   DOS: 07/15/2020    Subjective: Reported some cough.  Objective:  Vitals:   07/15/20 1149 07/15/20 1604  BP: 127/67 136/72  Pulse: 92 86  Resp: 14 16  Temp: 98.6 F (37 C) 97.6 F (36.4 C)  SpO2: 100% 100%    S1-S2 present. No edema. Bowel sounds Present. No abdominal tenderness.  Assessment and plan: Add Tessalon. Management per note by NP. Agree with assessment and plan documented.  Author: Lynden Oxford, MD [facility] 07/15/2020 7:05 PM   If 7PM-7AM, please contact night-coverage at [email or web address]

## 2020-07-16 DIAGNOSIS — M6289 Other specified disorders of muscle: Secondary | ICD-10-CM

## 2020-07-16 LAB — GLUCOSE, CAPILLARY
Glucose-Capillary: 115 mg/dL — ABNORMAL HIGH (ref 70–99)
Glucose-Capillary: 114 mg/dL — ABNORMAL HIGH (ref 70–99)
Glucose-Capillary: 124 mg/dL — ABNORMAL HIGH (ref 70–99)
Glucose-Capillary: 121 mg/dL — ABNORMAL HIGH (ref 70–99)
Glucose-Capillary: 90 mg/dL (ref 70–99)

## 2020-07-16 LAB — AFP TUMOR MARKER: AFP, Serum, Tumor Marker: 5.8 ng/mL (ref 0.0–8.3)

## 2020-07-16 LAB — MAGNESIUM: Magnesium: 1.9 mg/dL (ref 1.7–2.4)

## 2020-07-16 LAB — SARS CORONAVIRUS 2 BY RT PCR (HOSPITAL ORDER, PERFORMED IN ~~LOC~~ HOSPITAL LAB): SARS Coronavirus 2: NEGATIVE

## 2020-07-16 MED ORDER — PANTOPRAZOLE SODIUM 40 MG PO PACK: 40.0000 mg | PACK | Freq: Two times a day (BID) | ORAL | Status: DC

## 2020-07-16 MED ORDER — INSULIN ASPART 100 UNIT/ML ~~LOC~~ SOLN: 0.0000 [IU] | SUBCUTANEOUS | 11 refills | Status: DC

## 2020-07-16 MED ORDER — OSMOLITE 1.5 CAL PO LIQD: 840.0000 mL | ORAL | 0 refills | Status: DC

## 2020-07-16 MED ORDER — TIZANIDINE HCL 2 MG PO TABS: 2.0000 mg | ORAL_TABLET | Freq: Three times a day (TID) | ORAL | 0 refills | Status: DC

## 2020-07-16 MED ORDER — ENSURE ENLIVE PO LIQD
237.0000 mL | Freq: Three times a day (TID) | ORAL | Status: DC
  Administered 2020-07-16 (×2): 237 mL

## 2020-07-16 MED ORDER — PROSOURCE TF PO LIQD: 45.0000 mL | Freq: Three times a day (TID) | ORAL | Status: DC

## 2020-07-16 MED ORDER — CARVEDILOL 3.125 MG PO TABS: 3.1250 mg | ORAL_TABLET | Freq: Two times a day (BID) | ORAL | 0 refills | Status: DC

## 2020-07-16 MED ORDER — DEXTROMETHORPHAN POLISTIREX ER 30 MG/5ML PO SUER
15.0000 mg | Freq: Two times a day (BID) | ORAL | Status: DC | PRN
  Administered 2020-07-16: 15 mg
  Filled 2020-07-16 (×2): qty 5

## 2020-07-16 MED ORDER — ACETAMINOPHEN 325 MG PO TABS: 650.0000 mg | ORAL_TABLET | ORAL | Status: DC | PRN

## 2020-07-16 MED ORDER — FREE WATER: 200.0000 mL | Status: DC

## 2020-07-16 MED ORDER — ENSURE ENLIVE PO LIQD: 237.0000 mL | Freq: Three times a day (TID) | ORAL | 12 refills | Status: DC

## 2020-07-16 MED ORDER — CARVEDILOL 6.25 MG PO TABS: 6.2500 mg | ORAL_TABLET | Freq: Two times a day (BID) | ORAL | Status: DC

## 2020-07-16 MED ORDER — BACLOFEN 5 MG PO TABS: 5.0000 mg | ORAL_TABLET | Freq: Three times a day (TID) | ORAL | 0 refills | Status: DC

## 2020-07-16 MED ORDER — INSULIN GLARGINE 100 UNIT/ML ~~LOC~~ SOLN: 5.0000 [IU] | Freq: Every day | SUBCUTANEOUS | 11 refills | Status: DC

## 2020-07-16 MED ORDER — JUVEN PO PACK: 1.0000 | PACK | Freq: Two times a day (BID) | ORAL | 0 refills | Status: DC

## 2020-07-16 MED ORDER — SENNOSIDES 8.8 MG/5ML PO SYRP: 5.0000 mL | ORAL_SOLUTION | Freq: Two times a day (BID) | ORAL | 0 refills | Status: DC

## 2020-07-16 NOTE — Progress Notes (Signed)
[facility] PROGRESS NOTE  Alan SierrasJiles Guerrera WJX:[id number] DOB: 1956/01/08 DOA: 05/05/2020 PCP: Patient, No Pcp Per  Status: Remains inpatient appropriate because:Altered mental status and Unsafe d/c plan   Dispo: The patient is from: Home              Anticipated d/c is to: SNF              Anticipated d/c date is: 2 days              Patient currently is medically stable to d/c.  Barriers to discharge: Medicaid approval pending.  SNF bed offer pending as of 12/17.  TOC attempting to contact niece.  Patient could potentially discharge in the next 3 days    Code Status: Full Family Communication: Daughter 12/6 DVT prophylaxis: SCDs Vaccination status: Unknown  Foley catheter: No   HPI: 64 year old with history of iron deficiency anemia, history of polysubstance abuse (alcohol, cocaine and tobacco).  Presented to ER by EMS on 10/6 as a code stroke.  Include right-sided weakness, sensory loss and aphasia.  CT of the head was possible for intraparenchymal hemorrhage.  Neurosurgery consulted but no acute surgical needs identified.  UDS positive for cocaine.  Hospitalization complicated by pneumonia and recurrent blood loss anemia but did not require endoscopic evaluation.  Patient currently awaiting placement in skilled nursing facility with Medicaid application pending patient with poor oral intake to require nocturnal feedings by PEG tube.  Patient continues to have issues with waxing and waning speech which is primarily disfluent is also developing some significant hypertonicity and spasticity involving primarily the right lower extremity and hip which is causing pain as well as difficulty with mobilization.  A splint has been applied.  Patient has been started on baclofen and tizanidine.  May benefit from eventual Botox injection if persists over the long-term.  Dr. Riley KillSwartz with rehabilitation medicine will evaluate the patient later this week or early next week  Subjective: Awake.   Patient attempts to engage in conversation but speech remains unintelligible and inappropriate to conversation at hand consistent with both receptive and expressive aphasia.  Objective: Vitals:   07/16/20 0413 07/16/20 0856  BP: 107/69 120/72  Pulse: 84 87  Resp: 19 19  Temp: 98.7 F (37.1 C) 98.4 F (36.9 C)  SpO2: 99% 100%    Intake/Output Summary (Last 24 hours) at 07/16/2020 1010 Last data filed at 07/15/2020 1300 Gross per 24 hour  Intake 240 ml  Output --  Net 240 ml   Filed Weights   07/08/20 0500 07/11/20 0500 07/12/20 0500  Weight: 50.8 kg 51.7 kg 51.7 kg    Exam: General: Awake,  Calm, no acute distress Pulmonary: Bilateral lung sounds clear to auscultation, room air with no increased work of breathing Cardiac: S1-S2, no tachycardia, no JVD, no peripheral edema-adequate capillary refill Abdomen: Soft nontender nondistended, PEG tube in place for bolus tube feedings and medications, LBM 12/17 Neurological: Alert, conversation remains disfluent and consistent with global aphasia.  Improvement in right lower extremity hypertonicity and spasticity.  Brace currently off    Assessment/Plan: Acute problems: Acute stroke/cerebral edema -Secondary to severe hypertension, alcohol and cocaine use -Evaluated by neurosurgery without any acute indications for surgery identified -Continues with global aphasia occasionally does have some unintelligible speech and able to state first name -No antiplatelet therapy secondary to hemorrhagic etiology.  Normal LDL -Continue PT, OT, SLP  Right lower extremity hypertonicity -Continue baclofen 5 mg TID and tizanidine 2 mg HS -Rehabilitative medicine physician to  evaluate the patient to determine if other pharmacological modalities are indicated -Continue splint as applied by OT -Over the long-term if does not improve may require Botox injection-patient is >70 days out from initial presentation  Moderate to severe dysphagia with  associated severe protein calorie malnutrition -SLP has cleared for D3 diet but patient with poor oral intake therefore was continued on bolus tube feedings Nutrition Problem: Severe Malnutrition Etiology: social / environmental circumstances Signs/Symptoms: severe fat depletion,severe muscle depletion Interventions: Tube feeding Estimated body mass index is 18.4 kg/m as calculated from the following:   Height as of 04/09/20: 5\' 6"  (1.676 m).   Weight as of this encounter: 51.7 kg.   Acute on chronic anemia -Had symptomatic anemia requiring 6 units PRBC-current hemoglobin stable between 8 and 9 -GI consulted but no indication for emergent endoscopy this admission -Patient had outpatient endoscopy in August 2021 with no significant findings on colonoscopy or EGD other than gastritis -Continue twice daily PPI  Hepatitis C with transaminitis/indeterminate lesions on CT -HCV antibody reactive with a quantitative viral load of 792,000 -We will need need eventual OP follow-up with either GI or infectious disease -The been completed on 12/14 demonstrates 3 suspicious liver lesions without definitive signal characteristics.  The largest measures 3.4 cm in the anterior hepatic dome with metastatic disease not excluded close follow-up recommended and tissue sampling may be warranted. -Check AFP; clinically asymptomatic Mild transaminitis which is nonobstructive -LFTs last checked 11/5 remain mildly elevated without an obstructive pattern-repeat labs soon  Stage II buttocks decubitus Wound / Incision (Open or Dehisced) 05/21/20 Puncture Abdomen Left;Anterior;Upper G-tube insertion site  (Active)  Date First Assessed/Time First Assessed: 05/21/20 1533   Wound Type: Puncture  Location: Abdomen  Location Orientation: Left;Anterior;Upper  Wound Description (Comments): G-tube insertion site   Present on Admission: No    Assessments 05/21/2020  3:34 PM 07/14/2020  8:00 PM  Dressing Type Gauze  (Comment);Tape dressing --  Dressing Changed New --  Dressing Status Clean;Dry;Intact Clean;Dry;Intact  Dressing Change Frequency PRN --  Site / Wound Assessment Clean;Dry --  Peri-wound Assessment Intact --  Margins -- Attached edges (approximated)  Closure None --  Drainage Amount None None  Treatment Cleansed --     No Linked orders to display     Pressure Injury 06/02/20 Buttocks Right Stage 2 -  Partial thickness loss of dermis presenting as a shallow open injury with a red, pink wound bed without slough. 7.5 cm in lenth by 4.5 width  (Active)  Date First Assessed/Time First Assessed: 06/02/20 0800   Location: Buttocks  Location Orientation: Right  Staging: Stage 2 -  Partial thickness loss of dermis presenting as a shallow open injury with a red, pink wound bed without slough.  Wound Descri...    Assessments 06/02/2020  8:00 AM 07/07/2020 10:00 AM  Wound Length (cm) 7.5 cm 0 cm  Wound Width (cm) 4.5 cm 0 cm  Wound Depth (cm) -- 0 cm  Wound Surface Area (cm^2) 33.75 cm^2 0 cm^2  Wound Volume (cm^3) -- 0 cm^3  Drainage Amount None --  Treatment Other (Comment) --     No Linked orders to display    Other problems: Goals of care  Bibasilar pneumonia-resolved Klebsiella pneumonia-resolved Sinus tachycardia-resolved  Essential hypertension/grade 1 diastolic dysfunction -Continue carvedilol  Hyponatremia-resolved Poly-substance abuse (alcohol, tobacco, cocaine)  GERD/gastritis per endoscopy August 2021 -Continue twice daily PPI as above  Data Reviewed: Basic Metabolic Panel: Recent Labs  Lab 07/10/20 0035 07/14/20 0339 07/16/20 0443  NA 134* 140  --   K 3.3* 3.5  --   CL 102 105  --   CO2 24 25  --   GLUCOSE 136* 91  --   BUN 19 14  --   CREATININE 0.49* 0.60*  --   CALCIUM 9.1 9.7  --   MG  --   --  1.9   Liver Function Tests: Recent Labs  Lab 07/14/20 0339  AST 65*  ALT 81*  ALKPHOS 295*  BILITOT 0.5  PROT 8.2*  ALBUMIN 2.6*   No results for  input(s): LIPASE, AMYLASE in the last 168 hours. No results for input(s): AMMONIA in the last 168 hours. CBC: Recent Labs  Lab 07/10/20 0035  WBC 7.3  HGB 8.6*  HCT 27.4*  MCV 89.5  PLT 331   Cardiac Enzymes: No results for input(s): CKTOTAL, CKMB, CKMBINDEX, TROPONINI in the last 168 hours. BNP (last 3 results) Recent Labs    05/29/20 0409  BNP 23.2    ProBNP (last 3 results) No results for input(s): PROBNP in the last 8760 hours.  CBG: Recent Labs  Lab 07/15/20 1602 07/15/20 1934 07/15/20 2335 07/16/20 0403 07/16/20 0804  GLUCAP 95 143* 145* 115* 114*    Recent Results (from the past 240 hour(s))  SARS Coronavirus 2 by RT PCR (hospital order, performed in [facility] hospital lab) Nasopharyngeal Nasopharyngeal Swab     Status: None   Collection Time: 07/15/20  1:46 PM   Specimen: Nasopharyngeal Swab  Result Value Ref Range Status   SARS Coronavirus 2 NEGATIVE NEGATIVE Final    Comment: (NOTE) SARS-CoV-2 target nucleic acids are NOT DETECTED.  The SARS-CoV-2 RNA is generally detectable in upper and lower respiratory specimens during the acute phase of infection. The lowest concentration of SARS-CoV-2 viral copies this assay can detect is 250 copies / mL. A negative result does not preclude SARS-CoV-2 infection and should not be used as the sole basis for treatment or other patient management decisions.  A negative result may occur with improper specimen collection / handling, submission of specimen other than nasopharyngeal swab, presence of viral mutation(s) within the areas targeted by this assay, and inadequate number of viral copies (<250 copies / mL). A negative result must be combined with clinical observations, patient history, and epidemiological information.  Fact Sheet for Patients:   BoilerBrush.com.cy  Fact Sheet for Healthcare Providers: https://pope.com/  This test is not yet approved or   cleared by the [place] FDA and has been authorized for detection and/or diagnosis of SARS-CoV-2 by FDA under an Emergency Use Authorization (EUA).  This EUA will remain in effect (meaning this test can be used) for the duration of the COVID-19 declaration under Section 564(b)(1) of the Act, 21 U.S.C. section 360bbb-3(b)(1), unless the authorization is terminated or revoked sooner.  Performed at [facility], [address], [place], [place] [address]      Studies: No results found.  Scheduled Meds: . baclofen  5 mg Per Tube TID  . carvedilol  6.25 mg Per Tube BID WC  . chlorhexidine  15 mL Mouth Rinse BID  . feeding supplement  237 mL Per Tube TID BM  . feeding supplement (OSMOLITE 1.5 CAL)  840 mL Per Tube Q24H  . feeding supplement (PROSource TF)  45 mL Per Tube TID  . free water  200 mL Per Tube Q4H  . insulin aspart  0-9 Units Subcutaneous Q4H  . insulin glargine  5 Units Subcutaneous Daily  .  mouth rinse  15 mL Mouth Rinse q12n4p  . nutrition supplement (JUVEN)  1 packet Per Tube BID BM  . pantoprazole sodium  40 mg Per Tube BID  . sennosides  5 mL Per Tube BID  . tiZANidine  2 mg Per Tube TID   Continuous Infusions:  Principal Problem:   ICH (intracerebral hemorrhage) (HCC) Active Problems:   Polysubstance abuse (HCC)   Dysphagia due to recent cerebrovascular accident (CVA)   Hyperglycemia   Aspiration pneumonia of both lower lobes (HCC)   Palliative care by specialist   Goals of care, counseling/discussion   DNR (do not resuscitate) discussion   Protein-calorie malnutrition, severe   Heme positive stool   Acute lower UTI   Acute blood loss anemia   Pressure injury of skin   Aphasia as late effect of cerebrovascular accident   Hepatitis C   Lesion of liver   Consultants:  Neurology  Gastroenterology  Neurosurgery  Procedures:  Echocardiogram  Core track trach tube  PEG tube    Antibiotics: Anti-infectives (From admission,  onward)   Start     Dose/Rate Route Frequency Ordered Stop   05/28/20 0900  cefTRIAXone (ROCEPHIN) 1 g in sodium chloride 0.9 % 100 mL IVPB        1 g 200 mL/hr over 30 Minutes Intravenous Every 24 hours 05/28/20 0807 06/01/20 0855   05/21/20 1515  ceFAZolin (ANCEF) IVPB 2g/100 mL premix        2 g 200 mL/hr over 30 Minutes Intravenous To Radiology 05/21/20 1429 05/21/20 2100   05/14/20 1000  cefTRIAXone (ROCEPHIN) 2 g in sodium chloride 0.9 % 100 mL IVPB        2 g 200 mL/hr over 30 Minutes Intravenous Daily 05/13/20 1454 05/15/20 1255   05/13/20 2200  metroNIDAZOLE (FLAGYL) tablet 500 mg        500 mg Per Tube Every 8 hours 05/13/20 1847 05/15/20 2219   05/13/20 1400  metroNIDAZOLE (FLAGYL) tablet 500 mg  Status:  Discontinued        500 mg Oral Every 8 hours 05/13/20 0935 05/13/20 1847   05/12/20 0900  cefTRIAXone (ROCEPHIN) 1 g in sodium chloride 0.9 % 100 mL IVPB  Status:  Discontinued        1 g 200 mL/hr over 30 Minutes Intravenous Daily 05/12/20 0817 05/13/20 1454   05/12/20 0900  azithromycin (ZITHROMAX) 500 mg in sodium chloride 0.9 % 250 mL IVPB  Status:  Discontinued        500 mg 250 mL/hr over 60 Minutes Intravenous Daily 05/12/20 0817 05/13/20 0935   05/09/20 0930  Ampicillin-Sulbactam (UNASYN) 3 g in sodium chloride 0.9 % 100 mL IVPB  Status:  Discontinued        3 g 200 mL/hr over 30 Minutes Intravenous Every 8 hours 05/09/20 0837 05/09/20 0839   05/09/20 0930  Ampicillin-Sulbactam (UNASYN) 3 g in sodium chloride 0.9 % 100 mL IVPB  Status:  Discontinued        3 g 200 mL/hr over 30 Minutes Intravenous Every 6 hours 05/09/20 0839 05/12/20 0817       Time spent: 20 minutes    Junious Silk ANP  [facility] Pager [phone].

## 2020-07-16 NOTE — Progress Notes (Signed)
Occupational Therapy Treatment Patient Details Name: Alan Everett MRN: [id number] DOB: [birth year] Today's Date: 07/16/2020    History of present illness Patient is a 64 y/o male with PMH ETOH use, tobacco abuse, admitted with R side weakness,sensory loss and speech deficits.  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   OT comments  Pt seen in conjunction with PT to maximize pts activity tolerance, however pt limited by orthostatic hypotension this session ( see vitals below). Although pt needing less assist for bed mobility this session with improvements noted in static sitting balance with improved righting reactions without use of visual feedback. Pt required up to MOD A +2 for bed mobility and able to sit EOB with min guard with BUEs positioned on pts knees. Extensive stretching given to pts RUE as pt continues to present with increased tone. Pt would continue to benefit from skilled occupational therapy while admitted and after d/c to address the below listed limitations in order to improve overall functional mobility and facilitate independence with BADL participation. DC plan remains appropriate, will follow acutely per POC.   BP supine start of session 118/67  BP sitting EOB <3 min 86/57  returned to supine immediately with BP then 133/72   Follow Up Recommendations  Supervision/Assistance - 24 hour;SNF    Equipment Recommendations  Wheelchair (measurements OT);Wheelchair cushion (measurements OT);Hospital bed;Other (comment)    Recommendations for Other Services      Precautions / Restrictions Precautions Precautions: Fall Precaution Comments: Orthostatic hypotension, G tube, right neglect, prevalon boot, monitor BP Required Braces or Orthoses: Splint/Cast;Other Brace Splint/Cast: soft pillow splint R UE at night; R hip extension splint at night Restrictions Weight Bearing Restrictions: No       Mobility Bed  Mobility Overal bed mobility: Needs Assistance Bed Mobility: Rolling;Sidelying to Sit;Sit to Supine Rolling: Min assist Sidelying to sit: Mod assist;+2 for physical assistance   Sit to supine: Mod assist;+2 for physical assistance   General bed mobility comments: Extra time and verbal and tactile cues provided to reach hands for L bed rails and to bring legs over to roll, minAx1 to complete. ModAx2 to manage legs and trunk with transitioning to sit and return to supine, with pt activating and initiating movement of legs when cued.  Transfers                 General transfer comment: Deferred due to orthostatic hypotension    Balance Overall balance assessment: Needs assistance Sitting-balance support: Single extremity supported;No upper extremity supported;Feet supported Sitting balance-Leahy Scale: Fair Sitting balance - Comments: Pt with improved midline sitting balance, progressing from L UE support on L bed rail to hands on lap maintaining midline without visual cues from mirror. Progressed from minA to min guard assist for balance sitting statically.       Standing balance comment: Deferred due to dec BP.                           ADL either performed or assessed with clinical judgement   ADL Overall ADL's : Needs assistance/impaired                           Toilet Transfer Details (indicate cue type and reason): defer OOB transfer d/t OH         Functional mobility during ADLs: Moderate assistance;+2 for physical assistance (bed mobility only) General ADL  Comments: pt limited by orthostatic hypotension this session therefore deffered OOB mobility, session focus on bed mobility, sitting balance and PROM to RUE     Vision   Additional Comments: Poor attention to R side. Able to track to left and right.   Perception     Praxis      Cognition Arousal/Alertness: Awake/alert Behavior During Therapy: WFL for tasks  assessed/performed Overall Cognitive Status: Impaired/Different from baseline Area of Impairment: Safety/judgement;Awareness;Problem solving;Following commands;Memory;Attention                   Current Attention Level: Sustained Memory: Decreased short-term memory Following Commands: Follows one step commands inconsistently;Follows one step commands with increased time Safety/Judgement: Decreased awareness of safety;Decreased awareness of deficits Awareness: Emergent Problem Solving: Slow processing;Requires verbal cues;Requires tactile cues;Difficulty sequencing;Decreased initiation General Comments: Multi-modal cues provided to pt during session and repeated as needed. Slow processing with continued neglect of R side, but able to attend to R side when cued. Pt with improved awareness of sitting balance without use of visual cues from mirror this date. Pt speaking intelligibly ~ 50% of session but did seem more in tune to deficits.        Exercises Exercises: Other exercises General Exercises - Upper Extremity Shoulder Flexion: Supine;PROM;Right;15 reps Elbow Flexion: PROM;15 reps;Right;Supine Elbow Extension: PROM;Right;Supine;15 reps Wrist Flexion: PROM;Right;Supine;15 reps Wrist Extension: PROM;Right;15 reps;Supine Digit Composite Flexion: PROM;Right;15 reps Composite Extension: PROM;Right;Supine;15 reps Other Exercises Other Exercises: PROM to R knee into extension 2x ~30-60 sec Other Exercises: PROM R ankle dorsiflexion 1x ~30 sec Other Exercises: PROM R hip into extension 2x ~45-60 sec Other Exercises: PROM R hip into adduction while supine 3x ~40-60 sec Other Exercises: PROM with gravity assisting R hip into adduction while sidelying on L, with improved range compared to in supine   Shoulder Instructions       General Comments BP supine start of session 118/67, BP sitting EOB <3 min 86/57, returned to supine immediately with BP then 133/72, notified RN     Pertinent Vitals/ Pain       Pain Assessment: Faces Faces Pain Scale: Hurts whole lot Pain Location: R hip with adduction and extension and knee with extension Pain Descriptors / Indicators: Discomfort;Grimacing;Guarding;Moaning Pain Intervention(s): Limited activity within patient's tolerance;Monitored during session;Repositioned  Home Living                                          Prior Functioning/Environment              Frequency  Min 2X/week        Progress Toward Goals  OT Goals(current goals can now be found in the care plan section)  Progress towards OT goals: Progressing toward goals  Acute Rehab OT Goals Patient Stated Goal: to get better OT Goal Formulation: Patient unable to participate in goal setting Time For Goal Achievement: 07/23/20 Potential to Achieve Goals: Good  Plan Discharge plan remains appropriate;Frequency remains appropriate    Co-evaluation      Reason for Co-Treatment: Complexity of the patient's impairments (multi-system involvement);For patient/therapist safety;To address functional/ADL transfers PT goals addressed during session: Mobility/safety with mobility;Balance OT goals addressed during session: ADL's and self-care;Strengthening/ROM      AM-PAC OT "6 Clicks" Daily Activity     Outcome Measure   Help from another person eating meals?: Total (NPO) Help from another person taking care of personal  grooming?: A Lot Help from another person toileting, which includes using toliet, bedpan, or urinal?: Total Help from another person bathing (including washing, rinsing, drying)?: Total Help from another person to put on and taking off regular upper body clothing?: Total Help from another person to put on and taking off regular lower body clothing?: Total 6 Click Score: 7    End of Session    OT Visit Diagnosis: Unsteadiness on feet (R26.81);Muscle weakness (generalized) (M62.81) Hemiplegia - Right/Left:  Right Hemiplegia - dominant/non-dominant: Dominant   Activity Tolerance Treatment limited secondary to medical complications (Comment);Other (comment) (orthostatic hypotension)   Patient Left in bed;with call bell/phone within reach;with bed alarm set   Nurse Communication Mobility status;Other (comment) (OH)        Time: 5638-9373 OT Time Calculation (min): 30 min  Charges: OT General Charges $OT Visit: 1 Visit OT Treatments $Therapeutic Activity: 8-22 mins  Audery Amel., COTA/L Acute Rehabilitation Services [phone]   Angelina Pih 07/16/2020, 3:36 PM

## 2020-07-16 NOTE — Discharge Summary (Addendum)
Physician Discharge Summary  Decorian Schuenemann CLE:[id number] DOB: 1955-12-15 DOA: 05/05/2020  PCP: Patient, No Pcp Per  Admit date: 05/05/2020 Discharge date: 07/16/2020  Time spent: 37 minutes  Recommendations for Outpatient Follow-up:  1. Continue soft pillow splint to right upper extremity as well as right hip extension splint-both at hour of sleep 2. Discharging to [facility] [place] 3. Recommend CBC and electrolyte panel 1 week after discharge for routine follow-up 4. Patient does have suspicious liver lesions with history of hepatitis C with elevated viral load.,  AFP normal 5. 100% assist with meals. 6. Patient does have suspicious liver lesions with history of hepatitis C with elevated viral load.,  AFP normal 7. 100% assist with meals. 8. Check LFT in 1 week and Start Lipitor 20 mg daily if normal.    Contact information for follow-up providers    PCP. Schedule an appointment as soon as possible for a visit in 1 week(s).        [facility]. Call.   Specialty: Neurology Why: as needed.  Contact information: [address] [place] [address] [phone]           Contact information for after-discharge care    Destination    [facility] Preferred SNF .   Service: Skilled Nursing Contact information: [address] [place] [address] [phone]                  Referred to infectious disease.   Discharge Diagnoses:  Principal Problem:   ICH (intracerebral hemorrhage) (HCC) Active Problems:   Polysubstance abuse (HCC)   Dysphagia due to recent cerebrovascular accident (CVA)   Hyperglycemia   Aspiration pneumonia of both lower lobes (HCC)   Palliative care by specialist   Goals of care, counseling/discussion   DNR (do not resuscitate) discussion   Protein-calorie malnutrition, severe   Heme positive stool   Acute lower UTI   Acute blood loss anemia   Pressure injury of  skin   Aphasia as late effect of cerebrovascular accident   Hepatitis C   Lesion of liver   Muscle hypertonicity   Discharge Condition: Stable  Diet recommendation: Dysphagia 3 or mechanical soft diet with thin liquids  Filed Weights   07/08/20 0500 07/11/20 0500 07/12/20 0500  Weight: 50.8 kg 51.7 kg 51.7 kg    History of present illness:  64 year old with history of iron deficiency anemia, history of polysubstance abuse (alcohol, cocaine and tobacco).  Presented to ER by EMS on 10/6 as a code stroke.  Include right-sided weakness, sensory loss and aphasia.  CT of the head was possible for intraparenchymal hemorrhage.  Neurosurgery consulted but no acute surgical needs identified.  UDS positive for cocaine.  Hospitalization complicated by pneumonia and recurrent blood loss anemia but did not require endoscopic evaluation.  Patient currently awaiting placement in skilled nursing facility with Medicaid application pending patient with poor oral intake to require nocturnal feedings by PEG tube.  Patient continues to have issues with waxing and waning speech which is primarily disfluent is also developing some significant hypertonicity and spasticity involving primarily the right lower extremity and hip which is causing pain as well as difficulty with mobilization.  A splint has been applied.  Patient has been started on baclofen and tizanidine.  May benefit from eventual Botox injection if persists over the long-term.    Hospital Course:   Acute stroke/cerebral edema -Secondary to severe hypertension, alcohol and cocaine use -Evaluated by  neurosurgery without any acute indications for surgery identified -Continues with global aphasia -occasionally does have some intelligible speech and able to state first name but is very inconsistent -No antiplatelet therapy secondary to hemorrhagic etiology.  Normal LDL -Continue PT, OT, SLP after discharge to facility  Right lower extremity  hypertonicity -Continue baclofen 5 mg TID and tizanidine 2 mg HS -Continue splints to right upper and lower extremities -Over the long-term if does not improve may require Botox injection  Moderate to severe dysphagia with associated severe protein calorie malnutrition -SLP has cleared for D3 diet but patient with poor oral intake therefore was continued on bolus tube feedings Nutrition Problem: Severe Malnutrition Etiology: social / environmental circumstances Signs/Symptoms: severe fat depletion,severe muscle depletion Interventions: Tube feeding Estimated body mass index is 18.4 kg/m as calculated from the following:   Height as of 04/09/20: 5\' 6"  (1.676 m).   Weight as of this encounter: 51.7 kg.  Acute on chronic anemia -Had symptomatic anemia requiring 6 units PRBC-current hemoglobin stable between 8 and 9 -GI consulted but no indication for emergent endoscopy this admission -Patient had outpatient endoscopy in August 2021 with no significant findings on colonoscopy or EGD other than gastritis -Continue twice daily PPI -Commend repeat CBC 1 week after arrival to facility  Hepatitis C with transaminitis/indeterminate lesions on CT -HCV antibody reactive with a quantitative viral load of 792,000 -Will need infectious disease, referred  -MRI completed on 12/14 demonstrated 3 suspicious liver lesions without definitive signal characteristics.  The largest measured 3.4 cm in the anterior hepatic dome with metastatic disease not excluded close follow-up recommended and tissue sampling may be warranted AFP negative.   Essential hypertension/grade 1 diastolic dysfunction Orthostatic hypotension -Continue carvedilol, dose adjusted to positive orthostatics. Gradual change in position. TED stocking.   Bibasilar pneumonia-resolved Klebsiella pneumonia-resolved Sinus tachycardia-resolved Hyponatremia-resolved Poly-substance abuse (alcohol, tobacco, cocaine) GERD/gastritis per endoscopy  August 2021 -Continue twice daily PPI as above  Stage II buttocks decubitus     Wound / Incision (Open or Dehisced) 05/21/20 Puncture Abdomen Left;Anterior;Upper G-tube insertion site  (Active)  Date First Assessed/Time First Assessed: 05/21/20 1533   Wound Type: Puncture  Location: Abdomen  Location Orientation: Left;Anterior;Upper  Wound Description (Comments): G-tube insertion site   Present on Admission: No    Assessments 05/21/2020  3:34 PM 07/14/2020  8:00 PM  Dressing Type Gauze (Comment);Tape dressing --  Dressing Changed New --  Dressing Status Clean;Dry;Intact Clean;Dry;Intact  Dressing Change Frequency PRN --  Site / Wound Assessment Clean;Dry --  Peri-wound Assessment Intact --  Margins -- Attached edges (approximated)  Closure None --  Drainage Amount None None  Treatment Cleansed --     No Linked orders to display     Pressure Injury 06/02/20 Buttocks Right Stage 2 -  Partial thickness loss of dermis presenting as a shallow open injury with a red, pink wound bed without slough. 7.5 cm in lenth by 4.5 width  (Active)  Date First Assessed/Time First Assessed: 06/02/20 0800   Location: Buttocks  Location Orientation: Right  Staging: Stage 2 -  Partial thickness loss of dermis presenting as a shallow open injury with a red, pink wound bed without slough.  Wound Descri...    Assessments 06/02/2020  8:00 AM 07/07/2020 10:00 AM  Wound Length (cm) 7.5 cm 0 cm  Wound Width (cm) 4.5 cm 0 cm  Wound Depth (cm) -- 0 cm  Wound Surface Area (cm^2) 33.75 cm^2 0 cm^2  Wound Volume (cm^3) -- 0 cm^3  Drainage  Amount None --  Treatment Other (Comment) --     No Linked orders to display   Procedures:  Echocardiogram  Core track trach tube  PEG tube  Consultations:  Neurology  Gastroenterology  Neurosurgery    Discharge Exam: Vitals:   07/16/20 0856 07/16/20 1228  BP: 120/72 128/73  Pulse: 87 88  Resp: 19 20  Temp: 98.4 F (36.9 C) (!) 97.3 F (36.3 C)  SpO2:  100% 100%   General: Awake,  Calm, no acute distress Pulmonary: Bilateral lung sounds clear to auscultation, room air with no increased work of breathing Cardiac: S1-S2, no tachycardia, no JVD, no peripheral edema-adequate capillary refill Abdomen: Soft nontender nondistended, PEG tube in place for bolus tube feedings and medications, LBM 12/17 Neurological: Alert, conversation remains disfluent and consistent with global aphasia.  Improvement in right lower extremity hypertonicity and spasticity.  Brace currently off  Discharge Instructions   Discharge Instructions    Diet general   Complete by: As directed    Dysphagia III with thin liquids   Increase activity slowly   Complete by: As directed    No wound care   Complete by: As directed      Allergies as of 07/16/2020   No Known Allergies     Medication List    STOP taking these medications   Stool Softener 100 MG capsule Generic drug: docusate sodium     TAKE these medications   acetaminophen 325 MG tablet Commonly known as: TYLENOL Place 2 tablets (650 mg total) into feeding tube every 4 (four) hours as needed for mild pain (or temp > 37.5 C (99.5 F)). What changed:   medication strength  how much to take  how to take this  when to take this  reasons to take this   Baclofen 5 MG Tabs Place 5 mg into feeding tube 3 (three) times daily.   carvedilol 3.125 MG tablet Commonly known as: COREG Place 1 tablet (3.125 mg total) into feeding tube 2 (two) times daily with a meal.   feeding supplement Liqd Place 237 mLs into feeding tube 3 (three) times daily between meals.   feeding supplement (OSMOLITE 1.5 CAL) Liqd Place 840 mLs into feeding tube daily.   feeding supplement (PROSource TF) liquid Place 45 mLs into feeding tube 3 (three) times daily.   nutrition supplement (JUVEN) Pack Place 1 packet into feeding tube 2 (two) times daily between meals.   free water Soln Place 200 mLs into feeding tube  every 4 (four) hours.   insulin aspart 100 UNIT/ML injection Commonly known as: novoLOG Inject 0-9 Units into the skin every 4 (four) hours. Correction coverage: Sensitive (thin, NPO, renal)  CBG < 70: Implement Hypoglycemia Standing Orders and refer to Hypoglycemia Standing Orders sidebar report  CBG 70 - 120: 0 units  CBG 121 - 150: 1 unit  CBG 151 - 200: 2 units  CBG 201 - 250: 3 units  CBG 251 - 300: 5 units  CBG 301 - 350: 7 units  CBG 351 - 400 9 units  CBG > 400 call MD and obtain STAT lab verification   insulin glargine 100 UNIT/ML injection Commonly known as: LANTUS Inject 0.05 mLs (5 Units total) into the skin daily.   Iron (Ferrous Sulfate) 325 (65 Fe) MG Tabs Take 325 mg by mouth daily.   pantoprazole sodium 40 mg/20 mL Pack Commonly known as: PROTONIX Place 20 mLs (40 mg total) into feeding tube 2 (two) times daily.   sennosides  8.8 MG/5ML syrup Commonly known as: SENOKOT Place 5 mLs into feeding tube 2 (two) times daily.   tiZANidine 2 MG tablet Commonly known as: ZANAFLEX Place 1 tablet (2 mg total) into feeding tube 3 (three) times daily.      No Known Allergies    The results of significant diagnostics from this hospitalization (including imaging, microbiology, ancillary and laboratory) are listed below for reference.    Significant Diagnostic Studies: MR ABDOMEN W WO CONTRAST  Result Date: 07/14/2020 CLINICAL DATA:  Liver lesion. EXAM: MRI ABDOMEN WITHOUT AND WITH CONTRAST TECHNIQUE: Multiplanar multisequence MR imaging of the abdomen was performed both before and after the administration of intravenous contrast. CONTRAST:  5.29mL GADAVIST GADOBUTROL 1 MMOL/ML IV SOLN COMPARISON:  CT scan 05/27/2020 FINDINGS: Study is markedly motion degraded as patient was unable to follow breathing instructions. Lower chest: Unremarkable. Hepatobiliary: 3.4 cm lesion is identified in the anterior hepatic dome near the junction of segments IV and VIII (see T2 axial  image 8/series 8). Lesion has low signal intensity on precontrast T1 imaging, mildly increased T2 signal intensity and restricted diffusion. After IV contrast administration, there is heterogeneous arterial phase hyperenhancement. Washout is difficult to assess given the marked motion degradation, but delayed subtraction imaging suggests that the lesion retains contrast to a greater degree than background liver parenchyma (image 22 of subtraction delayed series 22). Additional lesions with similar signal characteristics are identified in the lateral segment left liver measuring 2.2 cm on image 11/series 8. A third small lesion in the inferior tip of the right liver measures 10 mm on image 25/series 8. No overt MR findings of cirrhosis although motion degradation hinders assessment. 11 mm gallstone evident. No intra or extrahepatic biliary duct dilatation. Pancreas: No focal mass lesion. No dilatation of the main duct. No intraparenchymal cyst. No peripancreatic edema. Imaging in the pancreas on postcontrast imaging is nondiagnostic due to motion artifact. Spleen:  No gross abnormality. Adrenals/Urinary Tract: No adrenal nodule or mass. Kidneys unremarkable on precontrast imaging. Evaluation of kidneys on postcontrast imaging is nondiagnostic due to motion. Stomach/Bowel: Stomach is unremarkable. No gastric wall thickening. No evidence of outlet obstruction. Duodenum is normally positioned as is the ligament of Treitz. No small bowel or colonic dilatation within the visualized abdomen. Vascular/Lymphatic: No abdominal aortic aneurysm. Upper normal lymph nodes are seen in the hepatoduodenal ligament. Other: No intraperitoneal free fluid. Bladder is visualized on coronal haste imaging and appears distended with bladder wall trabeculation. 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 No focal suspicious marrow enhancement within the visualized bony anatomy. IMPRESSION: 1. Markedly motion  limited exam shows 3 suspicious liver lesions without definitive signal characteristics. The largest measures 3.4 cm in the anterior hepatic dome. Metastatic disease is not excluded. Close follow-up recommended and tissue sampling may be warranted. 2. 11 mm gallstone. No intra or extrahepatic biliary duct dilatation. 3. Distended urinary bladder with bladder wall trabeculation. Imaging features suggest chronic bladder outlet obstruction. Electronically Signed   By: Kennith Center M.D.   On: 07/14/2020 05:32    Microbiology: Recent Results (from the past 240 hour(s))  SARS Coronavirus 2 by RT PCR (hospital order, performed in [facility] hospital lab) Nasopharyngeal Nasopharyngeal Swab     Status: None   Collection Time: 07/15/20  1:46 PM   Specimen: Nasopharyngeal Swab  Result Value Ref Range Status   SARS Coronavirus 2 NEGATIVE NEGATIVE Final    Comment: (NOTE) SARS-CoV-2 target nucleic acids are NOT DETECTED.  The SARS-CoV-2 RNA is  generally detectable in upper and lower respiratory specimens during the acute phase of infection. The lowest concentration of SARS-CoV-2 viral copies this assay can detect is 250 copies / mL. A negative result does not preclude SARS-CoV-2 infection and should not be used as the sole basis for treatment or other patient management decisions.  A negative result may occur with improper specimen collection / handling, submission of specimen other than nasopharyngeal swab, presence of viral mutation(s) within the areas targeted by this assay, and inadequate number of viral copies (<250 copies / mL). A negative result must be combined with clinical observations, patient history, and epidemiological information.  Fact Sheet for Patients:   BoilerBrush.com.cyhttps://www.fda.gov/media/136312/download  Fact Sheet for Healthcare Providers: https://pope.com/https://www.fda.gov/media/136313/download  This test is not yet approved or  cleared by the [place] FDA and has been authorized for  detection and/or diagnosis of SARS-CoV-2 by FDA under an Emergency Use Authorization (EUA).  This EUA will remain in effect (meaning this test can be used) for the duration of the COVID-19 declaration under Section 564(b)(1) of the Act, 21 U.S.C. section 360bbb-3(b)(1), unless the authorization is terminated or revoked sooner.  Performed at [facility], [address] [place], [place] [id number]      Labs: Basic Metabolic Panel: Recent Labs  Lab 07/10/20 0035 07/14/20 0339 07/16/20 0443  NA 134* 140  --   K 3.3* 3.5  --   CL 102 105  --   CO2 24 25  --   GLUCOSE 136* 91  --   BUN 19 14  --   CREATININE 0.49* 0.60*  --   CALCIUM 9.1 9.7  --   MG  --   --  1.9   Liver Function Tests: Recent Labs  Lab 07/14/20 0339  AST 65*  ALT 81*  ALKPHOS 295*  BILITOT 0.5  PROT 8.2*  ALBUMIN 2.6*   No results for input(s): LIPASE, AMYLASE in the last 168 hours. No results for input(s): AMMONIA in the last 168 hours. CBC: Recent Labs  Lab 07/10/20 0035  WBC 7.3  HGB 8.6*  HCT 27.4*  MCV 89.5  PLT 331   Cardiac Enzymes: No results for input(s): CKTOTAL, CKMB, CKMBINDEX, TROPONINI in the last 168 hours. BNP: BNP (last 3 results) Recent Labs    05/29/20 0409  BNP 23.2    ProBNP (last 3 results) No results for input(s): PROBNP in the last 8760 hours.  CBG: Recent Labs  Lab 07/15/20 1934 07/15/20 2335 07/16/20 0403 07/16/20 0804 07/16/20 1231  GLUCAP 143* 145* 115* 114* 124*       Signed:  Junious SilkAllison Ellis ANP [facility] 07/16/2020, 1:49 PM  Supervising attending attestation I have interviewed and examined the patient and agree with the documentation and management as recorded by the Advanced Practice Provider. I have made any necessary editorial changes.I have independently reviewed the clinical findings, lab results and imaging studies.  Author: Lynden OxfordPranav Lanai Conlee, MD [facility] 07/16/2020 2:47 PM

## 2020-07-16 NOTE — TOC Transition Note (Signed)
Transition of Care [facility]) - CM/SW Discharge Note   Patient Details  Name: Alan Everett MRN: [id number] Date of Birth: 1955/12/04  [facility] CM/SW Contact:  Kermit Balo, RN Phone Number: 07/16/2020, 2:47 PM   Clinical Narrative:    Pt is discharging to [facility] today. He will transport via [facility]. Bedside RN updated and d/c packet at the desk.  Room: 509A Number for report: [phone]   Final next level of care: Skilled Nursing Facility Barriers to Discharge: Inadequate or no insurance   Patient Goals and CMS Choice Patient states their goals for this hospitalization and ongoing recovery are:: patient unable to participate in goal setting due to disorientation CMS Medicare.gov Compare Post Acute Care list provided to:: Patient Represenative (must comment) Choice offered to / list presented to :  (niece and girlfriend)  Discharge Placement              Patient chooses bed at: [facility] [place] Patient to be transferred to facility by: [facility] Name of family member notified: Niece Patient and family notified of of transfer: 07/16/20  Discharge Plan and Services     Post Acute Care Choice: Skilled Nursing Facility                               Social Determinants of Health (SDOH) Interventions     Readmission Risk Interventions No flowsheet data found.

## 2020-07-16 NOTE — Plan of Care (Signed)
  Problem: Education: Goal: Knowledge of General Education information will improve Description: Including pain rating scale, medication(s)/side effects and non-pharmacologic comfort measures Outcome: Progressing   Problem: Health Behavior/Discharge Planning: Goal: Ability to manage health-related needs will improve Outcome: Progressing   Problem: Clinical Measurements: Goal: Ability to maintain clinical measurements within normal limits will improve Outcome: Progressing Goal: Will remain free from infection Outcome: Progressing Goal: Diagnostic test results will improve Outcome: Progressing Goal: Respiratory complications will improve Outcome: Progressing Goal: Cardiovascular complication will be avoided Outcome: Progressing   Problem: Activity: Goal: Risk for activity intolerance will decrease Outcome: Progressing   Problem: Nutrition: Goal: Adequate nutrition will be maintained Outcome: Progressing   Problem: Coping: Goal: Level of anxiety will decrease Outcome: Progressing   Problem: Elimination: Goal: Will not experience complications related to bowel motility Outcome: Progressing Goal: Will not experience complications related to urinary retention Outcome: Progressing   Problem: Pain Managment: Goal: General experience of comfort will improve Outcome: Progressing   Problem: Safety: Goal: Ability to remain free from injury will improve Outcome: Progressing   Problem: Skin Integrity: Goal: Risk for impaired skin integrity will decrease Outcome: Progressing   Problem: Education: Goal: Knowledge of disease or condition will improve Outcome: Progressing Goal: Knowledge of secondary prevention will improve Outcome: Progressing Goal: Knowledge of patient specific risk factors addressed and post discharge goals established will improve Outcome: Progressing Goal: Individualized Educational Video(s) Outcome: Progressing   Problem: Coping: Goal: Will verbalize  positive feelings about self Outcome: Progressing Goal: Will identify appropriate support needs Outcome: Progressing   Problem: Health Behavior/Discharge Planning: Goal: Ability to manage health-related needs will improve Outcome: Progressing   Problem: Self-Care: Goal: Ability to participate in self-care as condition permits will improve Outcome: Progressing Goal: Verbalization of feelings and concerns over difficulty with self-care will improve Outcome: Progressing Goal: Ability to communicate needs accurately will improve Outcome: Progressing   Problem: Nutrition: Goal: Risk of aspiration will decrease Outcome: Progressing Goal: Dietary intake will improve Outcome: Progressing   Problem: Intracerebral Hemorrhage Tissue Perfusion: Goal: Complications of Intracerebral Hemorrhage will be minimized Outcome: Progressing   

## 2020-07-16 NOTE — Progress Notes (Signed)
Report called to [employer] at [facility].

## 2020-07-16 NOTE — Progress Notes (Signed)
Physical Therapy Treatment Patient Details Name: Alan Everett MRN: [id number] DOB: 03-02-1956 Today's Date: 07/16/2020    History of Present Illness Patient is a 64 y/o male with PMH ETOH use, tobacco abuse, admitted with R side weakness,sensory loss and speech deficits.  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    PT Comments    Treated pt in conjunction with OT to maximize quality and safety of session and pt tolerance to activity. Planned to transfer pt to chair with use of stedy, but pt displayed orthostatic hypotension with transitioning supine > sit EOB, see General Comments below. Thus, pt returned to supine in bed at end of session. Pt displaying improved independence with bed mobility and sitting statically EOB in that he required only minAx1 to roll to L this date and progressed from L hand on bed rail to hands on lap maintaining midline balance with min guard assist. He continues to require modAx2 to transition to sit EOB and to supine in bed though, demonstrating poor coordination and strength. Pt displays excessive hip abduction, hip flexion, and knee flexion at rest, thus provided passive stretches to all. Improved relaxation and range into hip adduction when sidelying allowing gravity to provide passive stretch. Appears to have improved R hip extension PROM though, suggesting success with R hip splint. Will continue to follow acutely. Current recommendations remain appropriate.   Follow Up Recommendations  SNF     Equipment Recommendations  Wheelchair (measurements PT);Wheelchair cushion (measurements PT);Hospital bed;3in1 (PT);Other (comment) (mechanical lift)    Recommendations for Other Services       Precautions / Restrictions Precautions Precautions: Fall Precaution Comments: Orthostatic hypotension, G tube, right neglect, prevalon boot, monitor BP Required Braces or Orthoses: Splint/Cast;Other  Brace Splint/Cast: soft pillow splint R UE at night; R hip extension splint at night Restrictions Weight Bearing Restrictions: No    Mobility  Bed Mobility Overal bed mobility: Needs Assistance Bed Mobility: Rolling;Sidelying to Sit;Sit to Supine Rolling: Min assist Sidelying to sit: Mod assist;+2 for physical assistance   Sit to supine: Mod assist;+2 for physical assistance   General bed mobility comments: Extra time and verbal and tactile cues provided to reach hands for L bed rails and to bring legs over to roll, minAx1 to complete. ModAx2 to manage legs and trunk with transitioning to sit and return to supine, with pt activating and initiating movement of legs when cued.  Transfers                 General transfer comment: Deferred due to orthostatic hypotension  Ambulation/Gait             General Gait Details: Did not attempt this date   Stairs             Wheelchair Mobility    Modified Rankin (Stroke Patients Only) Modified Rankin (Stroke Patients Only) Pre-Morbid Rankin Score: No symptoms Modified Rankin: Severe disability     Balance Overall balance assessment: Needs assistance Sitting-balance support: Single extremity supported;No upper extremity supported;Feet supported Sitting balance-Leahy Scale: Fair Sitting balance - Comments: Pt with improved midline sitting balance, progressing from L UE support on L bed rail to hands on lap maintaining midline without visual cues from mirror. Progressed from minA to min guard assist for balance sitting statically.       Standing balance comment: Deferred due to dec BP.  Cognition Arousal/Alertness: Awake/alert Behavior During Therapy: WFL for tasks assessed/performed Overall Cognitive Status: Impaired/Different from baseline Area of Impairment: Safety/judgement;Awareness;Problem solving;Following commands;Memory;Attention                   Current  Attention Level: Sustained Memory: Decreased short-term memory Following Commands: Follows one step commands inconsistently;Follows one step commands with increased time Safety/Judgement: Decreased awareness of safety;Decreased awareness of deficits Awareness: Emergent Problem Solving: Slow processing;Requires verbal cues;Requires tactile cues;Difficulty sequencing;Decreased initiation General Comments: Multi-modal cues provided to pt during session and repeated as needed. Slow processing with continued neglect of R side, requiring cues to remain attentive to R. Pt with improved awareness of sitting balance without use of visual cues from mirror this date. Pt speaking intelligibly ~ 50% of session but did seem more in tune to deficits.      Exercises Other Exercises Other Exercises: PROM to R knee into extension 2x ~30-60 sec Other Exercises: PROM R ankle dorsiflexion 1x ~30 sec Other Exercises: PROM R hip into extension 2x ~45-60 sec Other Exercises: PROM R hip into adduction while supine 3x ~40-60 sec Other Exercises: PROM with gravity assisting R hip into adduction while sidelying on L, with improved range compared to in supine    General Comments General comments (skin integrity, edema, etc.): BP supine start of session 118/67, BP sitting EOB <3 min 86/57, returned to supine immediately with BP then 133/72, notified RN      Pertinent Vitals/Pain Pain Assessment: Faces Faces Pain Scale: Hurts whole lot Pain Location: R hip with adduction and extension and knee with extension Pain Descriptors / Indicators: Discomfort;Grimacing;Guarding;Moaning Pain Intervention(s): Limited activity within patient's tolerance;Monitored during session;Repositioned    Home Living                      Prior Function            PT Goals (current goals can now be found in the care plan section) Acute Rehab PT Goals Patient Stated Goal: to get better PT Goal Formulation: Patient unable to  participate in goal setting Time For Goal Achievement: 07/30/20 Potential to Achieve Goals: Fair Progress towards PT goals: Progressing toward goals    Frequency    Min 4X/week      PT Plan Current plan remains appropriate;Frequency needs to be updated    Co-evaluation PT/OT/SLP Co-Evaluation/Treatment: Yes Reason for Co-Treatment: Complexity of the patient's impairments (multi-system involvement);For patient/therapist safety;To address functional/ADL transfers PT goals addressed during session: Mobility/safety with mobility;Balance        AM-PAC PT "6 Clicks" Mobility   Outcome Measure  Help needed turning from your back to your side while in a flat bed without using bedrails?: A Lot Help needed moving from lying on your back to sitting on the side of a flat bed without using bedrails?: A Lot Help needed moving to and from a bed to a chair (including a wheelchair)?: Total Help needed standing up from a chair using your arms (e.g., wheelchair or bedside chair)?: A Lot Help needed to walk in hospital room?: Total Help needed climbing 3-5 steps with a railing? : Total 6 Click Score: 9    End of Session   Activity Tolerance: Patient tolerated treatment well;Treatment limited secondary to medical complications (Comment) (orthostatic hypotension) Patient left: in bed;with call bell/phone within reach;with bed alarm set Nurse Communication: Mobility status;Precautions;Other (comment) (orthostatic hypotension and necessity for use of splints at night) PT Visit Diagnosis: Unsteadiness on feet (R26.81);Muscle weakness (generalized) (  M62.81);Difficulty in walking, not elsewhere classified (R26.2);Other symptoms and signs involving the nervous system (R29.898);Hemiplegia and hemiparesis Hemiplegia - Right/Left: Right Hemiplegia - dominant/non-dominant: Dominant Hemiplegia - caused by: Nontraumatic intracerebral hemorrhage     Time: 1155-1223 PT Time Calculation (min) (ACUTE ONLY):  28 min  Charges:  $Therapeutic Activity: 8-22 mins                     Raymond Gurney, PT, DPT Acute Rehabilitation Services  Pager: [phone] Office: [phone]    Jewel Baize 07/16/2020, 1:48 PM

## 2020-08-12 NOTE — Progress Notes (Signed)
..  The following Assist/Replace Program for Venofer from [facility] has been terminated due to Assistance program expired, no treatment history on file.  Last DOS: none

## 2020-11-16 ENCOUNTER — Telehealth: Payer: Self-pay

## 2020-11-18 ENCOUNTER — Other Ambulatory Visit: Payer: Self-pay | Admitting: Family Medicine

## 2020-11-18 DIAGNOSIS — R627 Adult failure to thrive: Secondary | ICD-10-CM

## 2020-11-19 ENCOUNTER — Ambulatory Visit: Admission: RE | Admit: 2020-11-19 | Payer: MEDICAID | Source: Ambulatory Visit

## 2020-11-25 ENCOUNTER — Other Ambulatory Visit: Payer: Self-pay | Admitting: Radiology

## 2020-11-25 NOTE — Progress Notes (Signed)
Called [facility] : spoke with Nurse Gean Maidens. Informed RN to have pt. NPO after MN, hold tube feeds after MN, to have pt.. at hospital by 09:30 AM 11/26/20. Also asked for med list and last doses given to be sent to hospital with pt.

## 2020-11-26 ENCOUNTER — Other Ambulatory Visit: Payer: Self-pay

## 2020-11-26 ENCOUNTER — Ambulatory Visit
Admission: RE | Admit: 2020-11-26 | Discharge: 2020-11-26 | Disposition: A | Discharge status: Home / Self Care | Payer: Medicaid Other | Source: Ambulatory Visit | Attending: Family Medicine | Admitting: Family Medicine

## 2020-11-26 DIAGNOSIS — Z431 Encounter for attention to gastrostomy: Secondary | ICD-10-CM | POA: Diagnosis present

## 2020-11-26 DIAGNOSIS — R627 Adult failure to thrive: Secondary | ICD-10-CM

## 2020-11-26 HISTORY — DX: Chronic viral hepatitis C: B18.2

## 2020-11-26 HISTORY — DX: Gastro-esophageal reflux disease with esophagitis, without bleeding: K21.00

## 2020-11-26 HISTORY — DX: Dysarthria following nontraumatic intracerebral hemorrhage: I69.122

## 2020-11-26 HISTORY — DX: Type 2 diabetes mellitus with hyperglycemia: E11.65

## 2020-11-26 HISTORY — DX: Iron deficiency: E61.1

## 2020-11-26 HISTORY — DX: Epileptic spasms, intractable, with status epilepticus: G40.823

## 2020-11-26 HISTORY — DX: Dysphagia, oropharyngeal phase: R13.12

## 2020-11-26 HISTORY — DX: Other muscle spasm: M62.838

## 2020-11-26 HISTORY — DX: Reserved for concepts with insufficient information to code with codable children: IMO0002

## 2020-11-26 HISTORY — DX: Gastrostomy status: Z93.1

## 2020-11-26 HISTORY — PX: IR GASTROSTOMY TUBE MOD SED: IMG625

## 2020-11-26 HISTORY — DX: Aphasia: R47.01

## 2020-11-26 HISTORY — DX: Nontraumatic intracerebral hemorrhage, unspecified: I61.9

## 2020-11-26 HISTORY — DX: Pressure ulcer of unspecified buttock, unspecified stage: L89.309

## 2020-11-26 HISTORY — DX: COVID-19: U07.1

## 2020-11-26 HISTORY — DX: Essential (primary) hypertension: I10

## 2020-11-26 IMAGING — XA IR PERC PLACEMENT GASTROSTOMY
1 series · 3 of 3 positions shown · non-contrast
Comparison: none

CLINICAL DATA: Cerebral hemorrhage and need for percutaneous
gastrostomy tube for nutrition.

[Series 1: fl (-) angio · 3 of 3 slices shown]
[im 1/3]
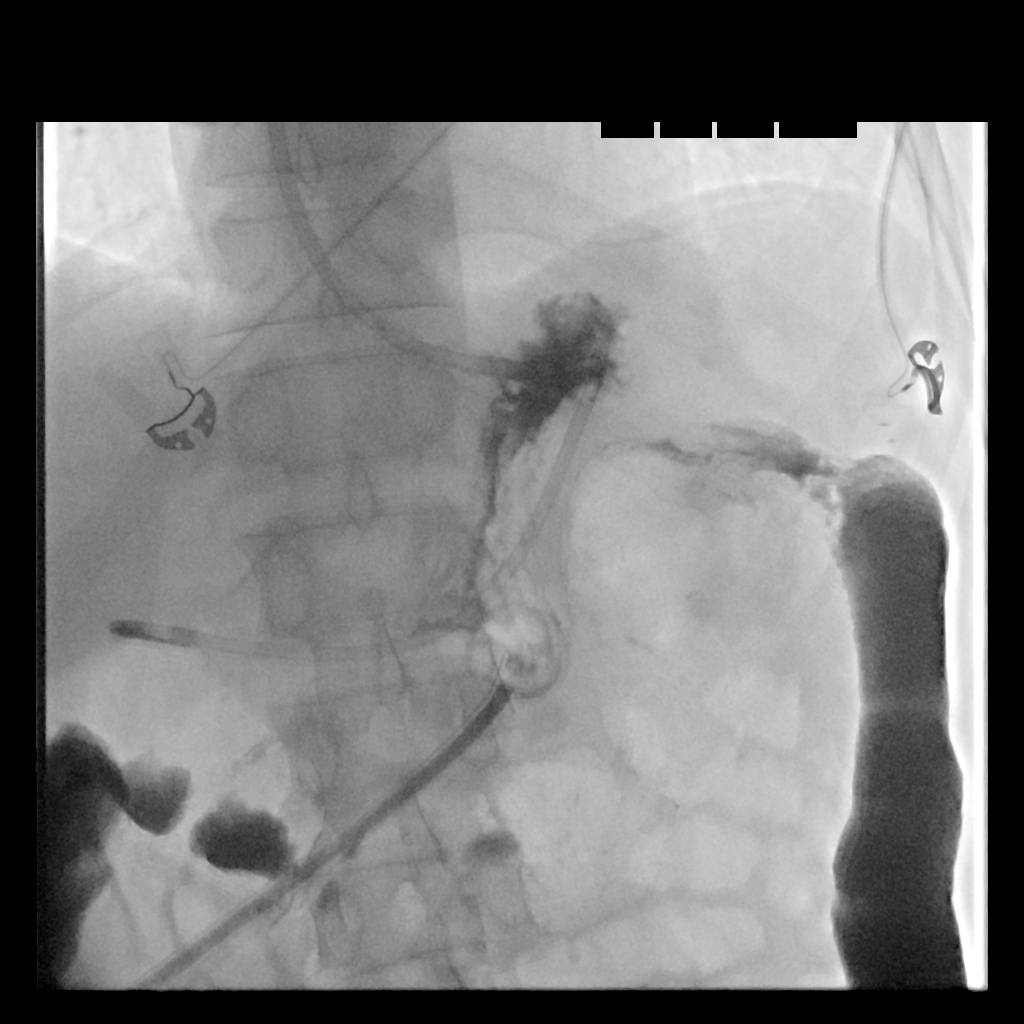
[im 2/3]
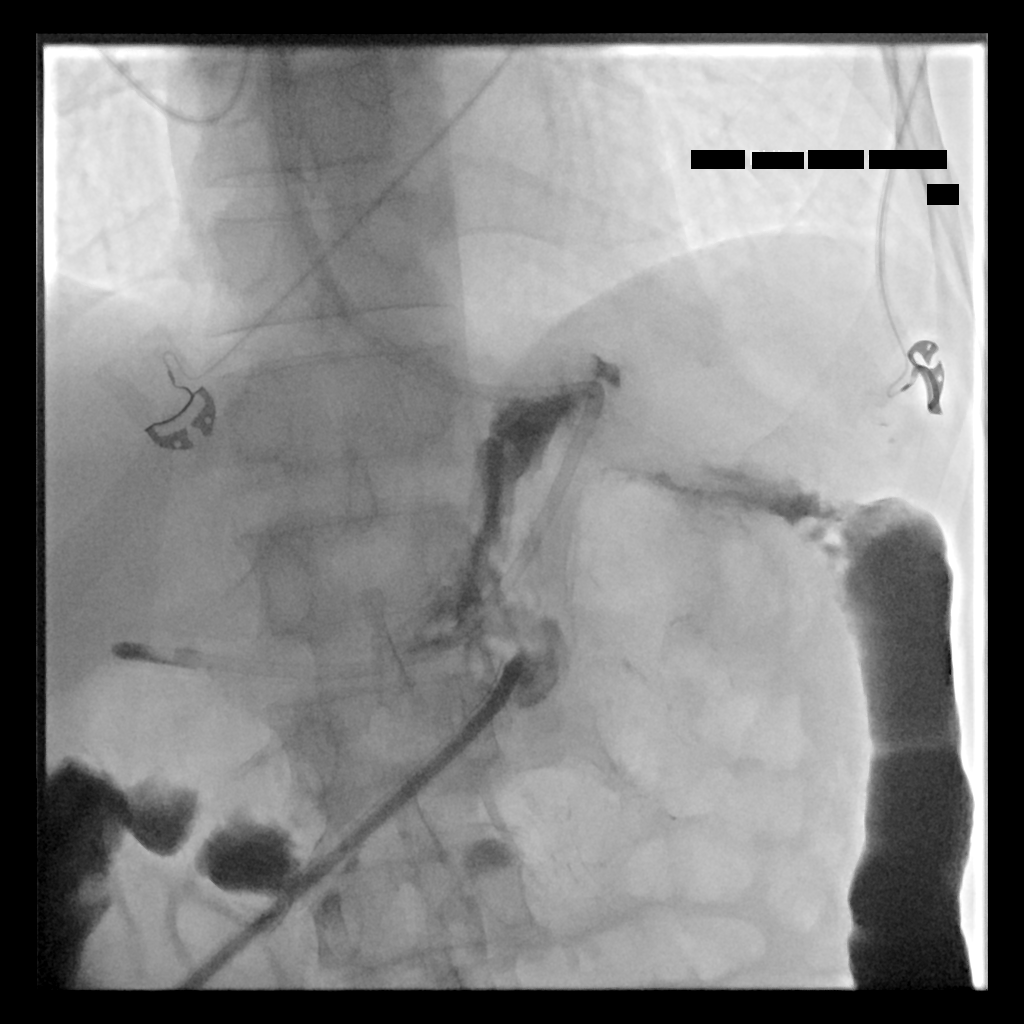
[im 3/3]
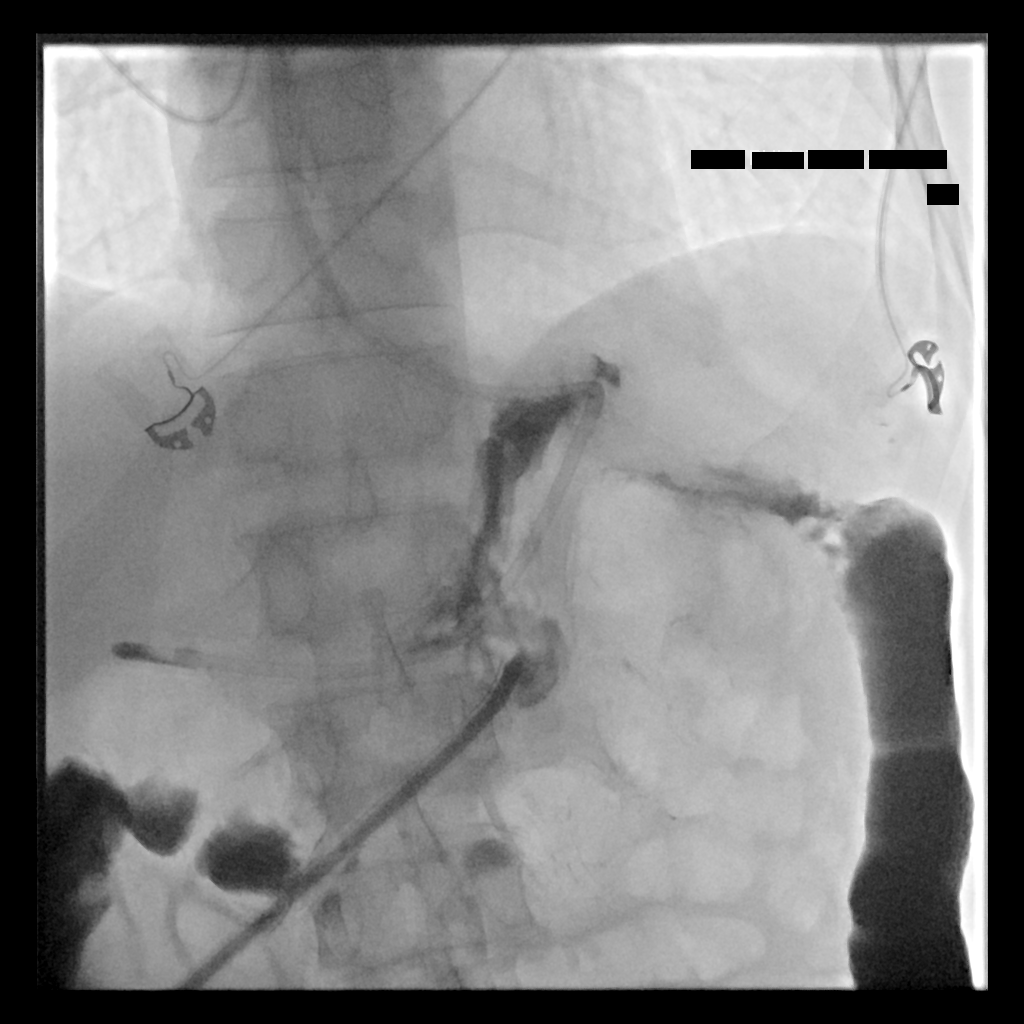

[3 of 3 positions shown; findings below may reference images not displayed]

EXAM:
PERCUTANEOUS GASTROSTOMY TUBE PLACEMENT

ANESTHESIA/SEDATION:
0.5 mg IV Versed; 25 mcg IV Fentanyl.

Total Moderate Sedation Time

10 minutes.

The patient's level of consciousness and physiologic status were
continuously monitored during the procedure by Radiology nursing.

CONTRAST:  15mL OMNIPAQUE IOHEXOL 300 MG/ML  SOLN

MEDICATIONS:
2 g IV Ancef. IV antibiotic was administered in an appropriate time
interval prior to needle puncture of the skin.

FLUOROSCOPY TIME:  2 minutes and 36 seconds.  5.0 mGy.

PROCEDURE:
The procedure, risks, benefits, and alternatives were explained to
the patient's niece. Questions regarding the procedure were
encouraged and answered. The patient's niece understands and
consents to the procedure.

Prior to the procedure, barium was administered via a feeding tube
in order to opacify the colon. A 5-French catheter was then advanced
through the patient's mouth under fluoroscopy into the esophagus and
to the level of the stomach. This catheter was used to insufflate
the stomach with air under fluoroscopy.

The abdominal wall was prepped with chlorhexidine in a sterile
fashion, and a sterile drape was applied covering the operative
field. A sterile gown and sterile gloves were used for the
procedure. Local anesthesia was provided with 1% Lidocaine.

A skin incision was made in the upper abdominal wall. Under
fluoroscopy, an 18 gauge trocar needle was advanced into the
stomach. Contrast injection was performed to confirm intraluminal
position of the needle tip. A single T tack was then deployed in the
lumen of the stomach. This was brought up to tension at the skin
surface.

Over a guidewire, a 9-French sheath was advanced into the lumen of
the stomach. The wire was left in place as a safety wire. A loop
snare device from a percutaneous gastrostomy kit was then advanced
into the stomach.

A floppy guide wire was advanced through the orogastric catheter
under fluoroscopy in the stomach. The loop snare advanced through
the percutaneous gastric access was used to snare the guide wire.
This allowed withdrawal of the loop snare out of the patient's mouth
by retraction of the orogastric catheter and wire.

A 20-French bumper retention gastrostomy tube was looped around the
snare device. It was then pulled back through the patient's mouth.
The retention bumper was brought up to the anterior gastric wall.
The T tack suture was cut at the skin. The exiting gastrostomy tube
was cut to appropriate length and a feeding adapter applied. The
catheter was injected with contrast material to confirm position and
a fluoroscopic spot image saved. The tube was then flushed with
saline. A dressing was applied over the gastrostomy exit site.

COMPLICATIONS:
None.
FINDINGS: Initial fluoroscopy demonstrates adequate opacification of the colon
by ingested barium in order to prevent colonic injury during the
procedure. The stomach distended well with air allowing safe
placement of the gastrostomy tube. After placement, the tip of the
gastrostomy tube lies in the body of the stomach.
IMPRESSION: Percutaneous gastrostomy with placement of a 20-French bumper
retention tube in the body of the stomach. This tube can be used for
percutaneous feeds beginning in 24 hours after placement.

## 2020-11-26 NOTE — Discharge Instructions (Signed)
Gastrostomy Tube Home Guide, Adult A gastrostomy tube, or G-tube, is a tube that is inserted through the abdomen into the stomach. The tube is used to give feedings and medicines when a person cannot eat and drink enough on his or her own or take medicines by mouth. How to care for the insertion site Supplies needed:  Saline solution or clean, warm water and soap. Saline solution is made of salt and water.  Cotton swab or gauze.  Pre-cut gauze bandage (dressing) and tape, if needed. Instructions Follow these steps daily to clean the insertion site: 1. Wash your hands with soap and water for at least 20 seconds. 2. Remove the dressing (if there is one) that is between the person's skin and the tube. 3. Check the area where the tube enters the skin. Check daily for problems such as: ? Redness, rash, or irritation. ? Swelling. ? Pus-like drainage. ? Extra skin growth. 4. Moisten the cotton swab or gauze with the saline solution or with a soap-and-water mixture. Gently clean around the insertion site. Remove any drainage or crusted material. ? When the G-tube is first put in, a normal saline solution or water can be used to clean the skin. ? After the skin around the tube has healed, mild soap and water may be used. 5. Apply a dressing (if there should be one) between the person's skin and the tube.   How to flush a G-tube Flush the G-tube regularly to keep it from clogging. Flush it before and after feedings and as often as told by the health care provider. Supplies needed:  Purified or germ-free (sterile) water, warmed.  Container with lid for boiling water, if needed.  60 cc G-tube syringe. Instructions Before you begin, decide whether to use sterile water or purified drinking water.  Use only sterile water if: ? The person has a weak disease-fighting (immune) system. ? The person has trouble fighting off infections (is immunocompromised). ? You are unsure about the amount of  chemical contaminants in purified or drinking water.  Use purified drinking water in all other cases. To purify drinking water by boiling: ? Boil water for at least 1 minute. Keep lid over water while it boils. ? Let water cool to room temperature before using. Follow these steps to flush the G-tube: 1. Wash your hands with soap and water for at least 20 seconds. 2. Bring out (draw up) 30 mL of warm water in a syringe. 3. Connect the syringe to the tube. 4. Slowly and gently push the water into the tube. General tips  If the tube comes out: ? Cover the opening with a clean dressing and tape. ? Get help right away.  If there is skin or scar tissue growing where the tube enters the skin: ? Keep the area clean and dry. ? Secure the tube with tape so that the tube does not move around too much.  If the tube gets clogged: ? Slowly push warm water into the tube with a large syringe. ? Do not force the fluid into the tube or push an object into the tube. ? Get help right away if you cannot unclog the tube. Follow these instructions at home: Feedings  Give feedings at room temperature.  If feedings are continuous: ? Do not put more than 4 hours' worth of feedings in the feeding bag. ? Stop the feedings when you need to give medicine or flush the tube. Be sure to restart the feedings. ? Make sure   the person's head is above his or her stomach (upright position). This will prevent choking and discomfort.  Make sure the person is in the right position during and after feedings. ? During feedings, have the person in the upright position. ? After a non-continuous feeding (bolus feeding), have the person stay in the upright position for 1 hour.  Cover and place unused feedings in the refrigerator.  Replace feeding bags and syringes as told. Good hygiene  Make sure the person takes good care of his or her mouth and teeth (oral hygiene), such as by brushing his or her teeth.  Keep the  area where the tube enters the skin clean and dry. General instructions  Use syringes made only for G-tubes.  Do not pull or put tension on the tube.  Before you remove the tube cap or disconnect a syringe, close the tube by using a clamp (clamping) or bending (kinking) the tube.  Measure the length of the G-tube every day from the insertion site to the end of the tube.  If the person's G-tube has a balloon, check the fluid in the balloon every week. Check the manufacturer's specifications to find the amount of fluid that should be in the balloon.  Remove excess air from the G-tube as told. This is called venting.  Do not push feedings, medicines, or flushes fast. Contact a health care provider if:  The person with the tube has constipation or a fever.  A large amount of fluid or mucus-like liquid is leaking from the tube.  Skin or scar tissue appears to be growing where the tube enters the skin.  The length of tube from the insertion site to the G-tube gets longer. Get help right away if:  The person with the tube has any of these problems: ? Severe pain, tenderness, or bloating in the abdomen. ? Nausea or vomiting. ? Trouble breathing or shortness of breath.  Any of these problems happen in the area where the tube enters the skin: ? Redness, irritation, swelling, or soreness. ? Pus-like discharge. ? A bad smell.  The tube is clogged and cannot be flushed.  The tube comes out. The tube will need to put back in within 4 hours. Summary  A gastrostomy tube, or G-tube, is a tube that is inserted through the abdomen into the stomach. The tube is used to give feedings and medicines when a person cannot eat and drink enough on his or her own or cannot take medicine by mouth.  Check and clean the insertion site daily as told by the person's health care provider.  Flush the G-tube regularly to keep it from clogging. Flush it before and after feedings and as often as  told.  Keep the area where the tube enters the skin clean and dry. This information is not intended to replace advice given to you by your health care provider. Make sure you discuss any questions you have with your health care provider. Document Revised: 12/01/2019 Document Reviewed: 12/04/2019 Elsevier Patient Education  2021 [employer]  

## 2021-03-14 ENCOUNTER — Other Ambulatory Visit: Payer: Self-pay | Admitting: Family Medicine

## 2021-03-14 DIAGNOSIS — Z8719 Personal history of other diseases of the digestive system: Secondary | ICD-10-CM

## 2021-03-16 ENCOUNTER — Ambulatory Visit
Admission: RE | Admit: 2021-03-16 | Discharge: 2021-03-16 | Disposition: A | Discharge status: Home / Self Care | Payer: Medicare Other | Source: Ambulatory Visit | Attending: Family Medicine | Admitting: Family Medicine

## 2021-03-16 ENCOUNTER — Other Ambulatory Visit: Payer: Self-pay

## 2021-03-16 DIAGNOSIS — Z8719 Personal history of other diseases of the digestive system: Secondary | ICD-10-CM | POA: Diagnosis not present

## 2021-03-16 DIAGNOSIS — Z431 Encounter for attention to gastrostomy: Secondary | ICD-10-CM | POA: Insufficient documentation

## 2021-03-16 HISTORY — PX: IR GASTROSTOMY TUBE REMOVAL: IMG5492

## 2021-03-16 MED ORDER — LIDOCAINE VISCOUS HCL 2 % MT SOLN
OROMUCOSAL | Status: DC | PRN
  Administered 2021-03-16: 15 mL via OROMUCOSAL

## 2021-03-16 MED ORDER — LIDOCAINE VISCOUS HCL 2 % MT SOLN
OROMUCOSAL | Status: AC
  Filled 2021-03-16: qty 15

## 2021-03-16 MED ORDER — SILVER NITRATE-POT NITRATE 75-25 % EX MISC
CUTANEOUS | Status: DC | PRN
  Administered 2021-03-16: 1 via TOPICAL

## 2021-03-16 NOTE — Procedures (Signed)
PROCEDURE SUMMARY:  Successful removal of percutaneous gastrostomy tube. Silver nitrate stick used for small ooze. Bleeding stopped and dressing was applied. No immediate complications.  Pt tolerated well.   EBL < 4mL  Cloretta Ned 03/16/2021 11:36 AM

## 2021-03-17 ENCOUNTER — Encounter: Payer: Self-pay | Admitting: Internal Medicine

## 2022-02-09 ENCOUNTER — Emergency Department: Payer: Medicare Other

## 2022-02-09 ENCOUNTER — Encounter: Payer: Self-pay | Admitting: Internal Medicine

## 2022-02-09 ENCOUNTER — Emergency Department
Admission: EM | Admit: 2022-02-09 | Discharge: 2022-02-09 | Disposition: A | Discharge status: Skilled Nursing Facility | Payer: Medicare Other | Attending: Emergency Medicine | Admitting: Emergency Medicine

## 2022-02-09 DIAGNOSIS — S72101A Unspecified trochanteric fracture of right femur, initial encounter for closed fracture: Secondary | ICD-10-CM | POA: Insufficient documentation

## 2022-02-09 DIAGNOSIS — W19XXXA Unspecified fall, initial encounter: Secondary | ICD-10-CM | POA: Insufficient documentation

## 2022-02-09 DIAGNOSIS — Y92129 Unspecified place in nursing home as the place of occurrence of the external cause: Secondary | ICD-10-CM | POA: Insufficient documentation

## 2022-02-09 DIAGNOSIS — S72001A Fracture of unspecified part of neck of right femur, initial encounter for closed fracture: Secondary | ICD-10-CM

## 2022-02-09 DIAGNOSIS — S79911A Unspecified injury of right hip, initial encounter: Secondary | ICD-10-CM | POA: Diagnosis present

## 2022-02-09 DIAGNOSIS — M25551 Pain in right hip: Secondary | ICD-10-CM

## 2022-02-09 LAB — COMPREHENSIVE METABOLIC PANEL
ALT: 58 U/L — ABNORMAL HIGH (ref 0–44)
AST: 65 U/L — ABNORMAL HIGH (ref 15–41)
Albumin: 3.5 g/dL (ref 3.5–5.0)
Alkaline Phosphatase: 138 U/L — ABNORMAL HIGH (ref 38–126)
Anion gap: 9 (ref 5–15)
BUN: 10 mg/dL (ref 8–23)
CO2: 22 mmol/L (ref 22–32)
Calcium: 9.2 mg/dL (ref 8.9–10.3)
Chloride: 107 mmol/L (ref 98–111)
Creatinine, Ser: 0.75 mg/dL (ref 0.61–1.24)
GFR, Estimated: >60 mL/min (ref >60)
Glucose, Bld: 123 mg/dL — ABNORMAL HIGH (ref 70–99)
Potassium: 3.6 mmol/L (ref 3.5–5.1)
Sodium: 138 mmol/L (ref 135–145)
Total Bilirubin: 0.6 mg/dL (ref 0.3–1.2)
Total Protein: 8.5 g/dL — ABNORMAL HIGH (ref 6.5–8.1)

## 2022-02-09 LAB — CBC WITH DIFFERENTIAL/PLATELET
Abs Immature Granulocytes: 0.06 10*3/uL (ref 0.00–0.07)
Basophils Absolute: 0.1 10*3/uL (ref 0.0–0.1)
Basophils Relative: 1 %
Eosinophils Absolute: 0 10*3/uL (ref 0.0–0.5)
Eosinophils Relative: 0 %
HCT: 36 % — ABNORMAL LOW (ref 39.0–52.0)
Hemoglobin: 10.6 g/dL — ABNORMAL LOW (ref 13.0–17.0)
Immature Granulocytes: 1 %
Lymphocytes Relative: 7 %
Lymphs Abs: 0.7 10*3/uL (ref 0.7–4.0)
MCH: 26.4 pg (ref 26.0–34.0)
MCHC: 29.4 g/dL — ABNORMAL LOW (ref 30.0–36.0)
MCV: 89.8 fL (ref 80.0–100.0)
Monocytes Absolute: 1.5 10*3/uL — ABNORMAL HIGH (ref 0.1–1.0)
Monocytes Relative: 15 %
Neutro Abs: 7.8 10*3/uL — ABNORMAL HIGH (ref 1.7–7.7)
Neutrophils Relative %: 76 %
Platelets: 160 10*3/uL (ref 150–400)
RBC: 4.01 MIL/uL — ABNORMAL LOW (ref 4.22–5.81)
RDW: 19 % — ABNORMAL HIGH (ref 11.5–15.5)
WBC: 10.1 10*3/uL (ref 4.0–10.5)
nRBC: 0 % (ref 0.0–0.2)

## 2022-02-09 LAB — PROTIME-INR
INR: 1.2 (ref 0.8–1.2)
Prothrombin Time: 14.6 seconds (ref 11.4–15.2)

## 2022-02-09 MED ORDER — OXYCODONE-ACETAMINOPHEN 5-325 MG PO TABS: 1.0000 | ORAL_TABLET | Freq: Four times a day (QID) | ORAL | 0 refills | Status: DC | PRN

## 2022-02-09 MED ORDER — OXYCODONE-ACETAMINOPHEN 5-325 MG PO TABS
1.0000 | ORAL_TABLET | Freq: Once | ORAL | Status: AC
  Administered 2022-02-09: 1 via ORAL
  Filled 2022-02-09: qty 1

## 2022-02-09 NOTE — ED Notes (Signed)
Lavonda LPN given report.

## 2022-02-09 NOTE — ED Notes (Signed)
Pt taken to radiology

## 2022-02-09 NOTE — ED Notes (Signed)
Pt to ct 
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
## 2022-02-09 NOTE — ED Notes (Signed)
ED Provider at bedside. Family at bedside.  

## 2022-02-09 NOTE — ED Notes (Signed)
Called [facility] for transport to [facility] 

## 2022-02-09 NOTE — ED Notes (Signed)
Pt back from radiology 

## 2022-02-09 NOTE — Discharge Instructions (Signed)
As we discussed given that the right leg is non weight bearing it does not require surgery. Please use prescribed medication to help with pain management.
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
## 2022-05-18 ENCOUNTER — Other Ambulatory Visit: Payer: Self-pay

## 2022-05-18 ENCOUNTER — Emergency Department
Admission: EM | Admit: 2022-05-18 | Discharge: 2022-05-18 | Disposition: A | Discharge status: Skilled Nursing Facility | Payer: Medicare Other | Attending: Emergency Medicine | Admitting: Emergency Medicine

## 2022-05-18 ENCOUNTER — Encounter: Payer: Self-pay | Admitting: Intensive Care

## 2022-05-18 DIAGNOSIS — K625 Hemorrhage of anus and rectum: Secondary | ICD-10-CM | POA: Diagnosis present

## 2022-05-18 DIAGNOSIS — K922 Gastrointestinal hemorrhage, unspecified: Secondary | ICD-10-CM | POA: Diagnosis not present

## 2022-05-18 LAB — CBC WITH DIFFERENTIAL/PLATELET
Abs Immature Granulocytes: 0.01 10*3/uL (ref 0.00–0.07)
Basophils Absolute: 0.1 10*3/uL (ref 0.0–0.1)
Basophils Relative: 2 %
Eosinophils Absolute: 0.1 10*3/uL (ref 0.0–0.5)
Eosinophils Relative: 2 %
HCT: 33.9 % — ABNORMAL LOW (ref 39.0–52.0)
Hemoglobin: 9.9 g/dL — ABNORMAL LOW (ref 13.0–17.0)
Immature Granulocytes: 0 %
Lymphocytes Relative: 30 %
Lymphs Abs: 1.2 10*3/uL (ref 0.7–4.0)
MCH: 27.8 pg (ref 26.0–34.0)
MCHC: 29.2 g/dL — ABNORMAL LOW (ref 30.0–36.0)
MCV: 95.2 fL (ref 80.0–100.0)
Monocytes Absolute: 0.6 10*3/uL (ref 0.1–1.0)
Monocytes Relative: 16 %
Neutro Abs: 2 10*3/uL (ref 1.7–7.7)
Neutrophils Relative %: 50 %
Platelets: 236 10*3/uL (ref 150–400)
RBC: 3.56 MIL/uL — ABNORMAL LOW (ref 4.22–5.81)
RDW: 14 % (ref 11.5–15.5)
WBC: 3.9 10*3/uL — ABNORMAL LOW (ref 4.0–10.5)
nRBC: 0 % (ref 0.0–0.2)

## 2022-05-18 LAB — COMPREHENSIVE METABOLIC PANEL
ALT: 46 U/L — ABNORMAL HIGH (ref 0–44)
AST: 54 U/L — ABNORMAL HIGH (ref 15–41)
Albumin: 3.3 g/dL — ABNORMAL LOW (ref 3.5–5.0)
Alkaline Phosphatase: 170 U/L — ABNORMAL HIGH (ref 38–126)
Anion gap: 4 — ABNORMAL LOW (ref 5–15)
BUN: 12 mg/dL (ref 8–23)
CO2: 26 mmol/L (ref 22–32)
Calcium: 9.1 mg/dL (ref 8.9–10.3)
Chloride: 109 mmol/L (ref 98–111)
Creatinine, Ser: 0.67 mg/dL (ref 0.61–1.24)
GFR, Estimated: >60 mL/min (ref >60)
Glucose, Bld: 90 mg/dL (ref 70–99)
Potassium: 3.6 mmol/L (ref 3.5–5.1)
Sodium: 139 mmol/L (ref 135–145)
Total Bilirubin: 0.6 mg/dL (ref 0.3–1.2)
Total Protein: 8.1 g/dL (ref 6.5–8.1)

## 2022-05-18 MED ORDER — HYDROCORTISONE ACETATE 25 MG RE SUPP: 25.0000 mg | Freq: Two times a day (BID) | RECTAL | 1 refills | Status: DC

## 2022-05-18 NOTE — ED Triage Notes (Signed)
C/o rectal bleed. Arrived by EMS from [facility]. Staff reported history of GI bleeds.  EMS vtials: 105/63b/p 77HR 98%RA

## 2022-05-18 NOTE — Discharge Instructions (Signed)
Please seek medical attention for any high fevers, chest pain, shortness of breath, change in behavior, persistent vomiting, bloody stool or any other new or concerning symptoms.  

## 2022-05-18 NOTE — ED Provider Notes (Signed)
[facility] Provider Note    Event Date/Time   First MD Initiated Contact with Patient 05/18/22 1035     (approximate)   History   Rectal Bleeding   HPI  Alan Everett is a 66 y.o. male  who presents from living facility today because of concern for rectal bleeding. History is primarily obtained from family at bedside. They state patient has history of hemorrhoids. Had an episode of some bleeding a number of months ago that resolved on its own. The patient denies any abdominal pain. Apparently at living facility the patient was also found to have low blood pressure.   Physical Exam   Triage Vital Signs: ED Triage Vitals  Enc Vitals Group     BP 05/18/22 0806 99/65     Pulse Rate 05/18/22 0806 70     Resp 05/18/22 0806 17     Temp 05/18/22 0806 98 F (36.7 C)     Temp Source 05/18/22 0806 Oral     SpO2 05/18/22 0806 97 %     Weight 05/18/22 0801 114 lb 10.2 oz (52 kg)     Height 05/18/22 0801 5\' 6"  (1.676 m)     Head Circumference --      [facility] --      Pain Score 05/18/22 0800 0     Pain Loc --      Pain Edu? --      Excl. in GC? --     Most recent vital signs: Vitals:   05/18/22 0806  BP: 99/65  Pulse: 70  Resp: 17  Temp: 98 F (36.7 C)  SpO2: 97%    General: Awake, alert, not completely oriented. CV:  Good peripheral perfusion. Regular rate and rhythm. Resp:  Normal effort. Lungs clear. Abd:  No distention. Non tender. Rectal:  Small amount of red blood on glove. Tender.    ED Results / Procedures / Treatments   Labs (all labs ordered are listed, but only abnormal results are displayed) Labs Reviewed  CBC WITH DIFFERENTIAL/PLATELET - Abnormal; Notable for the following components:      Result Value   WBC 3.9 (*)    RBC 3.56 (*)    Hemoglobin 9.9 (*)    HCT 33.9 (*)    MCHC 29.2 (*)    All other components within normal limits  COMPREHENSIVE METABOLIC PANEL - Abnormal; Notable for the following components:   Albumin  3.3 (*)    AST 54 (*)    ALT 46 (*)    Alkaline Phosphatase 170 (*)    Anion gap 4 (*)    All other components within normal limits  PROTIME-INR  TYPE AND SCREEN     EKG  None   RADIOLOGY None   PROCEDURES:  Critical Care performed: No  Procedures   MEDICATIONS ORDERED IN ED: Medications - No data to display   IMPRESSION / MDM / ASSESSMENT AND PLAN / ED COURSE  I reviewed the triage vital signs and the nursing notes.                              Differential diagnosis includes, but is not limited to, hemorrhoids, anal fissure, diverticulosis.   Patient's presentation is most consistent with acute presentation with potential threat to life or bodily function.  Patient presented to the emergency department today because of concerns for rectal bleeding from living facility.  Family states patient has history  of hemorrhoids.  On rectal exam there was a small amount of bright red blood on the glove.  No obvious external hemorrhoids however the exam was quite tender to the patient.  At this time I do think likely anal fissure versus internal hemorrhoid.  I did discuss this patient and family.  Blood work today without any concerning anemia over patient's baseline.  Repeat blood pressure in the emergency department patient was found to be normotensive.  No episodes of rectal bleeding while patient was here in the emergency department.  This time I think be reasonable for patient be discharged back to facility.   FINAL CLINICAL IMPRESSION(S) / ED DIAGNOSES   Final diagnoses:  Lower GI bleed     Note:  This document was prepared using Dragon voice recognition software and may include unintentional dictation errors.    Nance Pear, MD 05/18/22 1440

## 2022-05-18 NOTE — ED Notes (Signed)
[facility] called  to  transport  pt  to  [facility]

## 2022-05-18 NOTE — ED Notes (Signed)
ED Provider at bedside. 

## 2022-05-18 NOTE — ED Notes (Signed)
Attempted to call report to [facility]. No answer on unit.

## 2022-05-18 NOTE — ED Notes (Signed)
Attempted to call report to [facility] x3 no answer on unit. Left this RN's phone number with reception so nursing staff could call this RN for report.

## 2022-05-24 ENCOUNTER — Ambulatory Visit (INDEPENDENT_AMBULATORY_CARE_PROVIDER_SITE_OTHER): Payer: Medicare Other | Admitting: Gastroenterology

## 2022-05-24 ENCOUNTER — Encounter: Payer: Self-pay | Admitting: Gastroenterology

## 2022-05-24 VITALS — BP 148/80 | HR 71

## 2022-05-24 DIAGNOSIS — K625 Hemorrhage of anus and rectum: Secondary | ICD-10-CM

## 2022-05-24 NOTE — Progress Notes (Signed)
Gastroenterology Consultation  Referring Provider:     No ref. provider found Primary Care Physician:  Patient, No Pcp Per Primary Gastroenterologist:  Dr. Allen Norris     Reason for Consultation:     Lower GI bleeding        HPI:   Alan Everett is a 66 y.o. y/o male referred for consultation & management of lower GI bleeding by Dr. Patient, No Pcp Per.  This patient comes in today after being seen in the past by Dr. Alice Reichert and most recently following up with Dr. Carlean Purl a gastroenterologist in [place].  Patient's colonoscopy by Dr. Alice Reichert in 2021 was reported to show diverticulosis but no significantly large internal hemorrhoids.  The patient has a history of anemia and the EGD and colonoscopy were done back in 2021 for that reason.  The patient was then recommended by Dr. Carlean Purl to undergo a capsule endoscopy since a cause for the patient's anemia was not found at that time. The patient is nonverbal and not mobile at the present time.  He is unable to give any meaningful history.   Past Medical History:  Diagnosis Date   Alcohol use    Aphasia    Chronic viral hepatitis C ([place])    COVID-19    Dysarthria following nontraumatic intracerebral hemorrhage    Dysphagia, oropharyngeal phase    Epileptic spasms, intractable, with status epilepticus ([place])    Essential hypertension    Gastro-esophageal reflux disease with esophagitis    Gastrostomy status ([place])    Iron deficiency    Nontraumatic intracerebral hemorrhage ([facility])    Pressure ulcer of unspecified buttock, unspecified stage    Spasm of muscle    Tobacco abuse    Type II diabetes mellitus, uncontrolled     Past Surgical History:  Procedure Laterality Date   COLONOSCOPY N/A 03/01/2020   Procedure: COLONOSCOPY;  Surgeon: Toledo, Benay Pike, MD;  Location: [facility] ENDOSCOPY;  Service: Gastroenterology;  Laterality: N/A;   ESOPHAGOGASTRODUODENOSCOPY N/A 03/01/2020   Procedure: ESOPHAGOGASTRODUODENOSCOPY (EGD);  Surgeon: Toledo, Benay Pike, MD;  Location: [facility] ENDOSCOPY;  Service: Gastroenterology;  Laterality: N/A;   FOOT FRACTURE SURGERY Left    IR GASTROSTOMY TUBE MOD SED  05/21/2020   IR GASTROSTOMY TUBE MOD SED  11/26/2020   IR GASTROSTOMY TUBE REMOVAL  03/16/2021    Prior to Admission medications   Medication Sig Start Date End Date Taking? Authorizing Provider  acetaminophen (TYLENOL) 325 MG tablet Place 2 tablets (650 mg total) into feeding tube every 4 (four) hours as needed for mild pain (or temp > 37.5 C (99.5 F)). 07/16/20   Samella Parr, NP  baclofen 5 MG TABS Place 5 mg into feeding tube 3 (three) times daily. 07/16/20   Samella Parr, NP  carvedilol (COREG) 3.125 MG tablet Place 1 tablet (3.125 mg total) into feeding tube 2 (two) times daily with a meal. 07/16/20   Lavina Hamman, MD  feeding supplement (ENSURE ENLIVE / ENSURE PLUS) LIQD Place 237 mLs into feeding tube 3 (three) times daily between meals. 07/16/20   Samella Parr, NP  hydrocortisone (ANUSOL-HC) 25 MG suppository Place 1 suppository (25 mg total) rectally every 12 (twelve) hours. 05/18/22 05/18/23  Nance Pear, MD  insulin aspart (NOVOLOG) 100 UNIT/ML injection Inject 0-9 Units into the skin every 4 (four) hours. Correction coverage: Sensitive (thin, NPO, renal)  CBG < 70: Implement Hypoglycemia Standing Orders and refer to Hypoglycemia Standing Orders sidebar report  CBG 70 - 120: 0  units  CBG 121 - 150: 1 unit  CBG 151 - 200: 2 units  CBG 201 - 250: 3 units  CBG 251 - 300: 5 units  CBG 301 - 350: 7 units  CBG 351 - 400 9 units  CBG > 400 call MD and obtain STAT lab verification 07/16/20   Russella Dar, NP  insulin glargine (LANTUS) 100 UNIT/ML injection Inject 0.05 mLs (5 Units total) into the skin daily. 07/16/20   Russella Dar, NP  Iron, Ferrous Sulfate, 325 (65 Fe) MG TABS Take 325 mg by mouth daily. 03/01/20   Tresa Moore, MD  nutrition supplement, JUVEN, (JUVEN) PACK Place 1 packet into feeding tube 2 (two)  times daily between meals. 07/16/20   Russella Dar, NP  Nutritional Supplements (FEEDING SUPPLEMENT, OSMOLITE 1.5 CAL,) LIQD Place 840 mLs into feeding tube daily. 07/16/20   Russella Dar, NP  Nutritional Supplements (FEEDING SUPPLEMENT, PROSOURCE TF,) liquid Place 45 mLs into feeding tube 3 (three) times daily. 07/16/20   Russella Dar, NP  oxyCODONE-acetaminophen (PERCOCET) 5-325 MG tablet Take 1 tablet by mouth every 6 (six) hours as needed for severe pain. 02/09/22 02/09/23  Phineas Semen, MD  pantoprazole sodium (PROTONIX) 40 mg/20 mL PACK Place 20 mLs (40 mg total) into feeding tube 2 (two) times daily. 07/16/20   Russella Dar, NP  sennosides (SENOKOT) 8.8 MG/5ML syrup Place 5 mLs into feeding tube 2 (two) times daily. 07/16/20   Russella Dar, NP  tiZANidine (ZANAFLEX) 2 MG tablet Place 1 tablet (2 mg total) into feeding tube 3 (three) times daily. 07/16/20   Russella Dar, NP  Water For Irrigation, Sterile (FREE WATER) SOLN Place 200 mLs into feeding tube every 4 (four) hours. 07/16/20   Russella Dar, NP    Family History  Problem Relation Age of Onset   Cancer Mother        his mother died of cancer, but patient is not sure which type of cancner.    Diabetes Mellitus II Niece      Social History   Tobacco Use   Smoking status: Former   Smokeless tobacco: Never  Substance Use Topics   Alcohol use: Not Currently    Alcohol/week: 6.0 standard drinks of alcohol    Types: 6 Cans of beer per week   Drug use: Never    Allergies as of 05/24/2022   (No Known Allergies)    Review of Systems:    All systems reviewed and negative except where noted in HPI.   Physical Exam:  There were no vitals taken for this visit. No LMP for male patient. General:   Alert,  in NAD Head:  Normocephalic and atraumatic. Eyes:  Sclera clear, no icterus.   Conjunctiva pink. Rectal:  Deferred.  Pulses:  Normal pulses noted. Extremities:  No clubbing or edema.  No  cyanosis. Neurologic: Unable to ascertain Skin:  Intact without significant lesions or rashes.  No jaundice. Lymph Nodes:  No significant cervical adenopathy. Psych:  Alert   Imaging Studies: No results found.  Assessment and Plan:   Alan Everett is a 66 y.o. y/o male who comes here today without any chaperone or power of attorney.  The power of attorney was called multiple times and never answered her phone.  The nursing home was called and stated that the power of attorney was reachable by phone but when called multiple times she never answered the phone.  The POA finally called  back and states that the patient has been having constipation with some rectal bleeding.  The patient's colonoscopy showed some diverticulosis without any real mention of hemorrhoids. I have recommended that the patient be on stool softeners to decrease his constipation thereby avoid straining and pushing which may exacerbate the bleeding.  The patient is not a good candidate for hemorrhoid surgery at this time since the pictures did not show very large internal hemorrhoids.  I have discussed this with the patient's significant other/POA and she understands the plan.    Midge Minium, MD. Clementeen Graham    Note: This dictation was prepared with Dragon dictation along with smaller phrase technology. Any transcriptional errors that result from this process are unintentional.

## 2022-07-26 ENCOUNTER — Emergency Department: Payer: Medicare Other

## 2022-07-26 ENCOUNTER — Inpatient Hospital Stay
Admission: EM | Admit: 2022-07-26 | Discharge: 2022-07-28 | DRG: 871 | Disposition: A | Discharge status: Other Acute Inpatient Hospital | Payer: Medicare Other | Attending: Student | Admitting: Student

## 2022-07-26 ENCOUNTER — Encounter: Payer: Self-pay | Admitting: Internal Medicine

## 2022-07-26 DIAGNOSIS — Z515 Encounter for palliative care: Secondary | ICD-10-CM | POA: Diagnosis present

## 2022-07-26 DIAGNOSIS — E1165 Type 2 diabetes mellitus with hyperglycemia: Secondary | ICD-10-CM | POA: Diagnosis present

## 2022-07-26 DIAGNOSIS — M899 Disorder of bone, unspecified: Secondary | ICD-10-CM | POA: Insufficient documentation

## 2022-07-26 DIAGNOSIS — Z794 Long term (current) use of insulin: Secondary | ICD-10-CM | POA: Diagnosis not present

## 2022-07-26 DIAGNOSIS — I69391 Dysphagia following cerebral infarction: Secondary | ICD-10-CM

## 2022-07-26 DIAGNOSIS — Z66 Do not resuscitate: Secondary | ICD-10-CM | POA: Diagnosis present

## 2022-07-26 DIAGNOSIS — R54 Age-related physical debility: Secondary | ICD-10-CM | POA: Diagnosis present

## 2022-07-26 DIAGNOSIS — E876 Hypokalemia: Secondary | ICD-10-CM | POA: Diagnosis present

## 2022-07-26 DIAGNOSIS — N390 Urinary tract infection, site not specified: Secondary | ICD-10-CM | POA: Diagnosis present

## 2022-07-26 DIAGNOSIS — R64 Cachexia: Secondary | ICD-10-CM | POA: Diagnosis present

## 2022-07-26 DIAGNOSIS — I619 Nontraumatic intracerebral hemorrhage, unspecified: Secondary | ICD-10-CM | POA: Diagnosis present

## 2022-07-26 DIAGNOSIS — Z1152 Encounter for screening for COVID-19: Secondary | ICD-10-CM

## 2022-07-26 DIAGNOSIS — K219 Gastro-esophageal reflux disease without esophagitis: Secondary | ICD-10-CM | POA: Diagnosis present

## 2022-07-26 DIAGNOSIS — B182 Chronic viral hepatitis C: Secondary | ICD-10-CM | POA: Diagnosis present

## 2022-07-26 DIAGNOSIS — Z87891 Personal history of nicotine dependence: Secondary | ICD-10-CM | POA: Diagnosis not present

## 2022-07-26 DIAGNOSIS — Z681 Body mass index (BMI) 19 or less, adult: Secondary | ICD-10-CM | POA: Diagnosis not present

## 2022-07-26 DIAGNOSIS — I612 Nontraumatic intracerebral hemorrhage in hemisphere, unspecified: Secondary | ICD-10-CM | POA: Diagnosis not present

## 2022-07-26 DIAGNOSIS — Z79899 Other long term (current) drug therapy: Secondary | ICD-10-CM

## 2022-07-26 DIAGNOSIS — L89312 Pressure ulcer of right buttock, stage 2: Secondary | ICD-10-CM | POA: Diagnosis present

## 2022-07-26 DIAGNOSIS — I69320 Aphasia following cerebral infarction: Secondary | ICD-10-CM

## 2022-07-26 DIAGNOSIS — K769 Liver disease, unspecified: Secondary | ICD-10-CM | POA: Diagnosis present

## 2022-07-26 DIAGNOSIS — Z789 Other specified health status: Secondary | ICD-10-CM | POA: Diagnosis present

## 2022-07-26 DIAGNOSIS — A419 Sepsis, unspecified organism: Principal | ICD-10-CM | POA: Diagnosis present

## 2022-07-26 DIAGNOSIS — E43 Unspecified severe protein-calorie malnutrition: Secondary | ICD-10-CM | POA: Diagnosis present

## 2022-07-26 DIAGNOSIS — D649 Anemia, unspecified: Secondary | ICD-10-CM | POA: Diagnosis present

## 2022-07-26 DIAGNOSIS — I69351 Hemiplegia and hemiparesis following cerebral infarction affecting right dominant side: Secondary | ICD-10-CM

## 2022-07-26 DIAGNOSIS — Z7401 Bed confinement status: Secondary | ICD-10-CM

## 2022-07-26 DIAGNOSIS — F1090 Alcohol use, unspecified, uncomplicated: Secondary | ICD-10-CM | POA: Diagnosis present

## 2022-07-26 DIAGNOSIS — R627 Adult failure to thrive: Secondary | ICD-10-CM | POA: Diagnosis present

## 2022-07-26 DIAGNOSIS — M898X9 Other specified disorders of bone, unspecified site: Secondary | ICD-10-CM

## 2022-07-26 DIAGNOSIS — R6520 Severe sepsis without septic shock: Secondary | ICD-10-CM | POA: Diagnosis present

## 2022-07-26 DIAGNOSIS — Z7189 Other specified counseling: Secondary | ICD-10-CM | POA: Diagnosis not present

## 2022-07-26 DIAGNOSIS — E872 Acidosis, unspecified: Secondary | ICD-10-CM | POA: Diagnosis present

## 2022-07-26 DIAGNOSIS — I10 Essential (primary) hypertension: Secondary | ICD-10-CM | POA: Diagnosis present

## 2022-07-26 DIAGNOSIS — Z809 Family history of malignant neoplasm, unspecified: Secondary | ICD-10-CM

## 2022-07-26 DIAGNOSIS — Z720 Tobacco use: Secondary | ICD-10-CM | POA: Diagnosis not present

## 2022-07-26 DIAGNOSIS — R6510 Systemic inflammatory response syndrome (SIRS) of non-infectious origin without acute organ dysfunction: Principal | ICD-10-CM

## 2022-07-26 DIAGNOSIS — Z833 Family history of diabetes mellitus: Secondary | ICD-10-CM

## 2022-07-26 LAB — CBC WITH DIFFERENTIAL/PLATELET
Abs Immature Granulocytes: 0.19 10*3/uL — ABNORMAL HIGH (ref 0.00–0.07)
Basophils Absolute: 0.1 10*3/uL (ref 0.0–0.1)
Basophils Relative: 0 %
Eosinophils Absolute: 0 10*3/uL (ref 0.0–0.5)
Eosinophils Relative: 0 %
HCT: 31.1 % — ABNORMAL LOW (ref 39.0–52.0)
Hemoglobin: 9.4 g/dL — ABNORMAL LOW (ref 13.0–17.0)
Immature Granulocytes: 1 %
Lymphocytes Relative: 6 %
Lymphs Abs: 1 10*3/uL (ref 0.7–4.0)
MCH: 29.1 pg (ref 26.0–34.0)
MCHC: 30.2 g/dL (ref 30.0–36.0)
MCV: 96.3 fL (ref 80.0–100.0)
Monocytes Absolute: 2.8 10*3/uL — ABNORMAL HIGH (ref 0.1–1.0)
Monocytes Relative: 17 %
Neutro Abs: 13 10*3/uL — ABNORMAL HIGH (ref 1.7–7.7)
Neutrophils Relative %: 76 %
Platelets: 292 10*3/uL (ref 150–400)
RBC: 3.23 MIL/uL — ABNORMAL LOW (ref 4.22–5.81)
RDW: 15.6 % — ABNORMAL HIGH (ref 11.5–15.5)
WBC: 17.1 10*3/uL — ABNORMAL HIGH (ref 4.0–10.5)
nRBC: 0 % (ref 0.0–0.2)

## 2022-07-26 LAB — URINALYSIS, COMPLETE (UACMP) WITH MICROSCOPIC
Bilirubin Urine: NEGATIVE
Glucose, UA: NEGATIVE mg/dL
Ketones, ur: 5 mg/dL — AB
Nitrite: NEGATIVE
Protein, ur: 30 mg/dL — AB
RBC / HPF: >50 RBC/hpf — ABNORMAL HIGH (ref 0–5)
Specific Gravity, Urine: >1.046 — ABNORMAL HIGH (ref 1.005–1.030)
Squamous Epithelial / HPF: NONE SEEN /HPF (ref 0–5)
WBC, UA: >50 WBC/hpf — ABNORMAL HIGH (ref 0–5)
pH: 6 (ref 5.0–8.0)

## 2022-07-26 LAB — TROPONIN I (HIGH SENSITIVITY)
Troponin I (High Sensitivity): 7 ng/L (ref <18)
Troponin I (High Sensitivity): 8 ng/L (ref <18)

## 2022-07-26 LAB — COMPREHENSIVE METABOLIC PANEL
ALT: 108 U/L — ABNORMAL HIGH (ref 0–44)
AST: 87 U/L — ABNORMAL HIGH (ref 15–41)
Albumin: 3.2 g/dL — ABNORMAL LOW (ref 3.5–5.0)
Alkaline Phosphatase: 196 U/L — ABNORMAL HIGH (ref 38–126)
Anion gap: 8 (ref 5–15)
BUN: 20 mg/dL (ref 8–23)
CO2: 28 mmol/L (ref 22–32)
Calcium: 10.2 mg/dL (ref 8.9–10.3)
Chloride: 103 mmol/L (ref 98–111)
Creatinine, Ser: 0.58 mg/dL — ABNORMAL LOW (ref 0.61–1.24)
GFR, Estimated: >60 mL/min (ref >60)
Glucose, Bld: 140 mg/dL — ABNORMAL HIGH (ref 70–99)
Potassium: 3.4 mmol/L — ABNORMAL LOW (ref 3.5–5.1)
Sodium: 139 mmol/L (ref 135–145)
Total Bilirubin: 1.4 mg/dL — ABNORMAL HIGH (ref 0.3–1.2)
Total Protein: 8.7 g/dL — ABNORMAL HIGH (ref 6.5–8.1)

## 2022-07-26 LAB — CBG MONITORING, ED
Glucose-Capillary: 121 mg/dL — ABNORMAL HIGH (ref 70–99)
Glucose-Capillary: 118 mg/dL — ABNORMAL HIGH (ref 70–99)

## 2022-07-26 LAB — PROTIME-INR
INR: 1.3 — ABNORMAL HIGH (ref 0.8–1.2)
Prothrombin Time: 16.1 seconds — ABNORMAL HIGH (ref 11.4–15.2)

## 2022-07-26 LAB — LACTIC ACID, PLASMA
Lactic Acid, Venous: 2.1 mmol/L (ref 0.5–1.9)
Lactic Acid, Venous: 1.6 mmol/L (ref 0.5–1.9)

## 2022-07-26 LAB — APTT: aPTT: 33 seconds (ref 24–36)

## 2022-07-26 LAB — RESP PANEL BY RT-PCR (FLU A&B, COVID) ARPGX2
Influenza A by PCR: NEGATIVE
Influenza B by PCR: NEGATIVE
SARS Coronavirus 2 by RT PCR: NEGATIVE

## 2022-07-26 MED ORDER — TRAMADOL HCL 50 MG PO TABS: 50.0000 mg | ORAL_TABLET | Freq: Three times a day (TID) | ORAL | Status: DC | PRN

## 2022-07-26 MED ORDER — VANCOMYCIN HCL 750 MG/150ML IV SOLN
750.0000 mg | INTRAVENOUS | Status: DC
  Filled 2022-07-26: qty 150

## 2022-07-26 MED ORDER — SODIUM CHLORIDE 0.9 % IV SOLN
2.0000 g | Freq: Once | INTRAVENOUS | Status: AC
  Administered 2022-07-26: 2 g via INTRAVENOUS
  Filled 2022-07-26: qty 12.5

## 2022-07-26 MED ORDER — SODIUM CHLORIDE 0.9 % IV SOLN
2.0000 g | Freq: Two times a day (BID) | INTRAVENOUS | Status: DC
  Administered 2022-07-27: 2 g via INTRAVENOUS
  Filled 2022-07-26: qty 12.5

## 2022-07-26 MED ORDER — SENNOSIDES-DOCUSATE SODIUM 8.6-50 MG PO TABS: 1.0000 | ORAL_TABLET | Freq: Every evening | ORAL | Status: DC | PRN

## 2022-07-26 MED ORDER — SODIUM CHLORIDE 0.9 % IV BOLUS
1000.0000 mL | Freq: Once | INTRAVENOUS | Status: AC
  Administered 2022-07-26: 1000 mL via INTRAVENOUS

## 2022-07-26 MED ORDER — INSULIN ASPART 100 UNIT/ML IJ SOLN
0.0000 [IU] | Freq: Three times a day (TID) | INTRAMUSCULAR | Status: DC
  Filled 2022-07-26: qty 1

## 2022-07-26 MED ORDER — ONDANSETRON HCL 4 MG/2ML IJ SOLN: 4.0000 mg | Freq: Four times a day (QID) | INTRAMUSCULAR | Status: DC | PRN

## 2022-07-26 MED ORDER — LACTATED RINGERS IV BOLUS (SEPSIS)
1000.0000 mL | Freq: Once | INTRAVENOUS | Status: AC
  Administered 2022-07-26: 1000 mL via INTRAVENOUS

## 2022-07-26 MED ORDER — ONDANSETRON HCL 4 MG PO TABS: 4.0000 mg | ORAL_TABLET | Freq: Four times a day (QID) | ORAL | Status: DC | PRN

## 2022-07-26 MED ORDER — IOHEXOL 350 MG/ML SOLN
75.0000 mL | Freq: Once | INTRAVENOUS | Status: AC | PRN
  Administered 2022-07-26: 75 mL via INTRAVENOUS

## 2022-07-26 MED ORDER — VANCOMYCIN HCL IN DEXTROSE 1-5 GM/200ML-% IV SOLN
1000.0000 mg | Freq: Once | INTRAVENOUS | Status: AC
  Administered 2022-07-26: 1000 mg via INTRAVENOUS
  Filled 2022-07-26: qty 200

## 2022-07-26 MED ORDER — METOPROLOL TARTRATE 5 MG/5ML IV SOLN: 2.5000 mg | INTRAVENOUS | Status: DC | PRN

## 2022-07-26 MED ORDER — ACETAMINOPHEN 325 MG PO TABS: 650.0000 mg | ORAL_TABLET | Freq: Four times a day (QID) | ORAL | Status: DC | PRN

## 2022-07-26 MED ORDER — SODIUM CHLORIDE 0.9 % IV SOLN
2.0000 g | Freq: Once | INTRAVENOUS | Status: AC
  Administered 2022-07-26: 2 g via INTRAVENOUS
  Filled 2022-07-26: qty 20

## 2022-07-26 MED ORDER — METRONIDAZOLE 500 MG/100ML IV SOLN
500.0000 mg | Freq: Two times a day (BID) | INTRAVENOUS | Status: DC
  Administered 2022-07-26 – 2022-07-27 (×2): 500 mg via INTRAVENOUS
  Filled 2022-07-26 (×2): qty 100

## 2022-07-26 MED ORDER — MORPHINE SULFATE (PF) 2 MG/ML IV SOLN: 2.0000 mg | INTRAVENOUS | Status: DC | PRN

## 2022-07-26 MED ORDER — ENOXAPARIN SODIUM 30 MG/0.3ML IJ SOSY
30.0000 mg | PREFILLED_SYRINGE | INTRAMUSCULAR | Status: DC
  Administered 2022-07-27: 30 mg via SUBCUTANEOUS
  Filled 2022-07-26: qty 0.3

## 2022-07-26 MED ORDER — INSULIN ASPART 100 UNIT/ML IJ SOLN: 0.0000 [IU] | Freq: Every day | INTRAMUSCULAR | Status: DC

## 2022-07-26 MED ORDER — ACETAMINOPHEN 325 MG RE SUPP: 650.0000 mg | Freq: Four times a day (QID) | RECTAL | Status: DC | PRN

## 2022-07-26 NOTE — Assessment & Plan Note (Signed)
-   Consultation placed to registered dietitian

## 2022-07-26 NOTE — Assessment & Plan Note (Addendum)
-   Patient meets criteria for sepsis with elevated heart rate, respiration rate, leukocytosis, elevated lactic acid - Initial lactic acid elevated on admission at 2.1, continue to follow second lactic acid - Broad-spectrum antibiotics with cefepime, vancomycin per pharmacy ordered; metronidazole 500 mg IV twice daily - UA ordered and pending collection - Blood cultures x 2 ordered, urine culture ordered - Admit to telemetry medical, inpatient

## 2022-07-26 NOTE — Assessment & Plan Note (Signed)
-   Discussed with [name]

## 2022-07-26 NOTE — Assessment & Plan Note (Signed)
Npo

## 2022-07-26 NOTE — ED Triage Notes (Signed)
Pt presents to the ED via [facility] from [facility] due to elevated HR. Pt has hx of CVA and R side contraction. Pt has slurred speech difficult to understand. Pt is alert but confused. Pt says "yes" to every question asked.   [facility] was contacted to get further information. Per facility pt was sent to the ED per family for further elevation due abnormal labs and an elevated HR. Pt is confused baseline and is not aware of his limitations.

## 2022-07-26 NOTE — ED Notes (Addendum)
Lab at bedside to obtain bloodwork

## 2022-07-26 NOTE — ED Triage Notes (Signed)
First Nurse Note:  Arrives from [facility] via [facility].  C/O elevated HR.  ST 126.  Temp 99  CBG:  203  100% RA  119/68.  Hx CVA. AO to baseline per EMS.  Answers yes or NO questions with right sided contractions.

## 2022-07-26 NOTE — ED Notes (Signed)
Pt resting at this time.  Call bell within reach.

## 2022-07-26 NOTE — Assessment & Plan Note (Addendum)
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 Cathie Hoops, medical oncology has been consulted via secure chat at the request of [name] to discuss patient's options from here. Dr. Cathie Hoops is aware of the consultation and patient will be seen tomorrow.

## 2022-07-26 NOTE — H&P (Addendum)
History and Physical   Christohper Dube TFT:[id number] DOB: 1955-12-08 DOA: 07/26/2022  PCP: Pcp, No  Patient coming from: [facility]  I have personally briefly reviewed patient's old medical records in [place].  Chief Concern: Abnormal labs  HPI: [name] is a 66 year old male with history of intracranial hemorrhagic stroke, bedbound state, polysubstance abuse, history of CVA with aphasia who presents emergency department from [facility] for chief concerns of abnormal labs.  Initial vitals in the ED showed temperature of 97.7, respiration rate of 18, heart rate of 131, blood pressure 112/65, SpO2 of 96% on room air.  Serum sodium is 139, potassium 3.4, chloride 103, bicarb 28, BUN of 20, serum creatinine 0.58, EGFR greater than 60, nonfasting blood glucose 140, WBC 17.1, hemoglobin 9.4, platelets of 292.  Alk phos was elevated at 196, AST 87, ALT of 108.  COVID/influenza A/influenza B PCR have been ordered and is in process. UA is pending collection.  Lactic acid is in process.  ED treatment: Ceftriaxone 2 g IV, sodium chloride 1 L bolus. --------------------------- At bedside patient can only tell me his name repeatedly.  He was not able to tell me his age, answer regarding the current calendar year, the current location.  He appears frail and cachectic.  He appears to be chronically ill.  He does not appear to be in acute distress at this time.  Family was not at bedside.  Stanton Kidney saw him on Christmas Day.  Social history: Patient is from [employer].  ROS unable to complete as patient is not awake alert.  ED Course: Discussed with emergency medicine provider, patient requiring hospitalization for chief concerns of abnormal labs.  Assessment/Plan  Principal Problem:   Sepsis ([place]) Active Problems:   Tobacco abuse   Alcohol use   ICH (intracerebral hemorrhage) (HCC)   Dysphagia due to recent cerebrovascular accident (CVA)   Protein-calorie  malnutrition, severe   Lesion of liver   Bone lesion   DNR (do not resuscitate)   Assessment and Plan:  * Sepsis ([place]) - Patient meets criteria for sepsis with elevated heart rate, respiration rate, leukocytosis, elevated lactic acid - Initial lactic acid elevated on admission at 2.1, continue to follow second lactic acid - Broad-spectrum antibiotics with cefepime, vancomycin per pharmacy ordered; metronidazole 500 mg IV twice daily - UA ordered and pending collection - Blood cultures x 2 ordered, urine culture ordered - Admit to telemetry medical, inpatient  DNR (do not resuscitate) - Discussed with [name]  Bone lesion - Numerous lytic destructive osseous lesions involving multiple skeletal bones including left posterior 9th rib, anterior left sixth rib, T10 vertebral body and superior aspect of T11, lytic lesion in the tip of the left scapula, and posterior elements/spinous process of T1 - Right hepatic lesion (2.8 cm) - Discussed extensively with POA, [name] at [phone] - Dr. Tasia Catchings, medical oncology has been consulted via secure chat at the request of Stanton Kidney to discuss patient's options from here. Dr. Tasia Catchings is aware of the consultation and patient will be seen tomorrow.  Lesion of liver - Updated POA, [name] over the phone - Oncology, Dr. Tasia Catchings has been consulted at the request of Stanton Kidney  Protein-calorie malnutrition, severe - Consultation placed to registered dietitian  Dysphagia due to recent cerebrovascular accident (CVA) - N.p.o.  Suspect failure to thrive due to the above diagnoses, patient is a candidate for hospice/palliative care.  As POA wishes to speak with oncology for discussion regarding possible options, I consulted  medical oncology and have not consult palliative care.  Chart reviewed.   DVT prophylaxis: Enoxaparin Code Status: DNR Diet: N.p.o. Family Communication: Updated Eliberto Ivory cell phone at [phone] Disposition Plan: Guarded  prognosis Consults called: Oncology, Dr. Tasia Catchings Admission status: Telemetry medical, inpatient  Past Medical History:  Diagnosis Date   Alcohol use    Aphasia    Chronic viral hepatitis C ([place])    COVID-19    Dysarthria following nontraumatic intracerebral hemorrhage    Dysphagia, oropharyngeal phase    Epileptic spasms, intractable, with status epilepticus ([place])    Essential hypertension    Gastro-esophageal reflux disease with esophagitis    Gastrostomy status ([place])    Iron deficiency    Nontraumatic intracerebral hemorrhage (HCC)    Pressure ulcer of unspecified buttock, unspecified stage    Spasm of muscle    Tobacco abuse    Type II diabetes mellitus, uncontrolled    Past Surgical History:  Procedure Laterality Date   COLONOSCOPY N/A 03/01/2020   Procedure: COLONOSCOPY;  Surgeon: Toledo, Benay Pike, MD;  Location: [facility] ENDOSCOPY;  Service: Gastroenterology;  Laterality: N/A;   ESOPHAGOGASTRODUODENOSCOPY N/A 03/01/2020   Procedure: ESOPHAGOGASTRODUODENOSCOPY (EGD);  Surgeon: Toledo, Benay Pike, MD;  Location: [facility] ENDOSCOPY;  Service: Gastroenterology;  Laterality: N/A;   FOOT FRACTURE SURGERY Left    IR GASTROSTOMY TUBE MOD SED  05/21/2020   IR GASTROSTOMY TUBE MOD SED  11/26/2020   IR GASTROSTOMY TUBE REMOVAL  03/16/2021   Social History:  reports that he has quit smoking. He has never used smokeless tobacco. He reports that he does not currently use alcohol after a past usage of about 6.0 standard drinks of alcohol per week. He reports that he does not use drugs.  No Known Allergies Family History  Problem Relation Age of Onset   Cancer Mother        his mother died of cancer, but patient is not sure which type of cancner.    Diabetes Mellitus II Niece    Family history: Family history reviewed and not pertinent  Prior to Admission medications   Medication Sig Start Date End Date Taking? Authorizing Provider  famotidine (PEPCID) 40 MG/5ML suspension Take 40 mg by mouth  daily. 06/20/22  Yes [provider]  traMADol (ULTRAM) 50 MG tablet Take 50 mg by mouth 3 (three) times daily as needed. 07/14/22  Yes [provider]  acetaminophen (TYLENOL) 325 MG tablet Place 2 tablets (650 mg total) into feeding tube every 4 (four) hours as needed for mild pain (or temp > 37.5 C (99.5 F)). 07/16/20   Samella Parr, NP  baclofen 5 MG TABS Place 5 mg into feeding tube 3 (three) times daily. 07/16/20   Samella Parr, NP  carvedilol (COREG) 3.125 MG tablet Place 1 tablet (3.125 mg total) into feeding tube 2 (two) times daily with a meal. 07/16/20   Lavina Hamman, MD  feeding supplement (ENSURE ENLIVE / ENSURE PLUS) LIQD Place 237 mLs into feeding tube 3 (three) times daily between meals. 07/16/20   Samella Parr, NP  hydrocortisone (ANUSOL-HC) 25 MG suppository Place 1 suppository (25 mg total) rectally every 12 (twelve) hours. 05/18/22 05/18/23  Nance Pear, MD  insulin aspart (NOVOLOG) 100 UNIT/ML injection Inject 0-9 Units into the skin every 4 (four) hours. Correction coverage: Sensitive (thin, NPO, renal)  CBG < 70: Implement Hypoglycemia Standing Orders and refer to Hypoglycemia Standing Orders sidebar report  CBG 70 - 120: 0 units  CBG 121 -  150: 1 unit  CBG 151 - 200: 2 units  CBG 201 - 250: 3 units  CBG 251 - 300: 5 units  CBG 301 - 350: 7 units  CBG 351 - 400 9 units  CBG > 400 call MD and obtain STAT lab verification 07/16/20   Samella Parr, NP  insulin glargine (LANTUS) 100 UNIT/ML injection Inject 0.05 mLs (5 Units total) into the skin daily. 07/16/20   Samella Parr, NP  Iron, Ferrous Sulfate, 325 (65 Fe) MG TABS Take 325 mg by mouth daily. 03/01/20   Sidney Ace, MD  nutrition supplement, JUVEN, (JUVEN) PACK Place 1 packet into feeding tube 2 (two) times daily between meals. 07/16/20   Samella Parr, NP  Nutritional Supplements (FEEDING SUPPLEMENT, OSMOLITE 1.5 CAL,) LIQD Place 840 mLs into feeding tube daily.  07/16/20   Samella Parr, NP  Nutritional Supplements (FEEDING SUPPLEMENT, PROSOURCE TF,) liquid Place 45 mLs into feeding tube 3 (three) times daily. 07/16/20   Samella Parr, NP  oxyCODONE-acetaminophen (PERCOCET) 5-325 MG tablet Take 1 tablet by mouth every 6 (six) hours as needed for severe pain. 02/09/22 02/09/23  Nance Pear, MD  pantoprazole sodium (PROTONIX) 40 mg/20 mL PACK Place 20 mLs (40 mg total) into feeding tube 2 (two) times daily. 07/16/20   Samella Parr, NP  sennosides (SENOKOT) 8.8 MG/5ML syrup Place 5 mLs into feeding tube 2 (two) times daily. 07/16/20   Samella Parr, NP  tiZANidine (ZANAFLEX) 2 MG tablet Place 1 tablet (2 mg total) into feeding tube 3 (three) times daily. 07/16/20   Samella Parr, NP  Water For Irrigation, Sterile (FREE WATER) SOLN Place 200 mLs into feeding tube every 4 (four) hours. 07/16/20   Samella Parr, NP   Physical Exam: Vitals:   07/26/22 1300 07/26/22 1440 07/26/22 1530 07/26/22 1647  BP: 133/69  130/78   Pulse: (!) 129 (!) 29 (!) 121   Resp: (!) 25 (!) 23 (!) 29   Temp:   97.8 F (36.6 C)   TempSrc:   Oral   SpO2: 97% 93% 97%   Weight:    40.8 kg   Constitutional: appears frail, cachectic, severely malnourished, NAD, calm, comfortable Eyes: PERRL, lids and conjunctivae normal ENMT: Mucous membranes are dry. Posterior pharynx clear of any exudate or lesions. Age-appropriate dentition.  Unable to assess hearing Neck: normal, supple, no masses, no thyromegaly Respiratory: clear to auscultation bilaterally, no wheezing, no crackles. Normal respiratory effort. No accessory muscle use.  Cardiovascular: Regular rate and rhythm, no murmurs / rubs / gallops. No extremity edema. 2+ pedal pulses. No carotid bruits.  Abdomen: Flat abdomen, no tenderness, no masses palpated, no hepatosplenomegaly. Bowel sounds positive.  Musculoskeletal: no clubbing / cyanosis. No joint deformity upper and lower extremities. Good ROM, no  contractures, no atrophy. Normal muscle tone.  Skin: no rashes, lesions, ulcers. No induration Neurologic: Sensation intact. Strength 5/5 in all 4.  Psychiatric: Patient is awake.  Patient is not alert to self, age, therefore lacks judgment, insight.  Unable to assess mood.   EKG: independently reviewed, showing sinus tachycardia with rate of 129, QTc 451  Chest x-ray on Admission: I personally reviewed and I agree with radiologist reading as below.  CT ABDOMEN PELVIS W CONTRAST  Result Date: 07/26/2022 CLINICAL DATA:  Possible sepsis, tachycardia EXAM: CT ABDOMEN AND PELVIS WITH CONTRAST TECHNIQUE: Multidetector CT imaging of the abdomen and pelvis was performed using the standard protocol following bolus administration of intravenous  contrast. RADIATION DOSE REDUCTION: This exam was performed according to the departmental dose-optimization program which includes automated exposure control, adjustment of the mA and/or kV according to patient size and/or use of iterative reconstruction technique. CONTRAST:  37m OMNIPAQUE IOHEXOL 350 MG/ML SOLN COMPARISON:  05/11/2020 FINDINGS: Lower chest: There is a large lytic lesion involving the right side of body of T10 vertebra with soft tissue mass causing significant spinal stenosis. There is a lytic lesion with adjacent soft tissue mass in the anterior left seventh rib. There is a large lytic lesion in the posterior left tenth rib with associated 5.9 x 3.2 cm soft tissue mass. There is no focal consolidation in the lower lung fields. Hepatobiliary: There is 3.3 x 2.4 cm low-density lesion in the inferior aspect of right lobe of liver. There are few other smaller low-density lesions in the liver. There is no dilation of bile ducts. There is ring-like calcification in the neck of the gallbladder, possibly gallstones or calcification in the wall of gallbladder. Pancreas: No focal abnormalities are seen. Spleen: Unremarkable. Adrenals/Urinary Tract: Adrenals are  unremarkable. There is slight prominence of collecting system in left kidney. There are few bilateral renal stones largest in the lower pole of right kidney measuring 9 mm in size. There is no significant dilation of the ureters. There is mild wall thickening in the left ureter. There are multiple calcific densities in the dependent portion of gallbladder lumen. There are patchy areas of increased density in the right posterolateral aspect of urinary bladder. Stomach/Bowel: Stomach is unremarkable. Small bowel loops are not dilated. Appendix is not distinctly seen. There is no pericecal inflammation. There is no significant wall thickening in colon. Few diverticula are seen in colon without signs of focal diverticulitis. Vascular/Lymphatic: Fairly extensive calcifications are seen in infrarenal aorta and its major branches. Reproductive: There is inhomogeneous attenuation in prostate. Other: There is no ascites or pneumoperitoneum. Musculoskeletal: There is large lytic lesion in left iliac bone adjacent to the upper margin of left acetabulum. There is 5.2 x 4.9 cm associated soft tissue mass. There are other lytic lesions in lumbar vertebrae, especially on the right side at L5 level. There are few other small lytic lesions in the pelvis. There is marked deformity of proximal right femur suggesting hold, possibly ununited fracture in the intertrochanteric region. IMPRESSION: There are multiple lytic lesions of varying sizes in bony structures in lower thorax, abdomen and pelvis suggesting skeletal metastatic disease. There is a large lytic lesion in the right side of T10 vertebra with associated soft tissue mass causing significant spinal stenosis. There are low-density lesions in liver suggesting possible hepatic metastatic disease. Follow-up PET-CT and biopsy as warranted should be considered. There is no evidence of intestinal obstruction or pneumoperitoneum. There is no hydronephrosis. Bilateral renal stones.  There are multiple calcific densities in the dependent portion of urinary bladder suggesting bladder calculi. There are patchy foci of increased density in the right posterior inferior aspect of the urinary bladder suggesting presence of blood clots or soft tissue mass. Aortic arteriosclerosis. Other findings as described in the body of the report. Electronically Signed   By: PElmer PickerM.D.   On: 07/26/2022 15:27   CT Angio Chest PE W and/or Wo Contrast  Result Date: 07/26/2022 CLINICAL DATA:  Pulmonary embolism (PE) suspected, high prob tachycardia, high risk PE. exlcude PE or pneumonia EXAM: CT ANGIOGRAPHY CHEST WITH CONTRAST TECHNIQUE: Multidetector CT imaging of the chest was performed using the standard protocol during bolus administration of intravenous  contrast. Multiplanar CT image reconstructions and MIPs were obtained to evaluate the vascular anatomy. RADIATION DOSE REDUCTION: This exam was performed according to the departmental dose-optimization program which includes automated exposure control, adjustment of the mA and/or kV according to patient size and/or use of iterative reconstruction technique. CONTRAST:  72m OMNIPAQUE IOHEXOL 350 MG/ML SOLN COMPARISON:  05/11/2020 FINDINGS: Cardiovascular: No filling defects in the pulmonary arteries to suggest pulmonary emboli. Heart is normal size. Aorta is normal caliber. Coronary artery and aortic calcifications. Mediastinum/Nodes: No mediastinal, hilar, or axillary adenopathy. Trachea and esophagus are unremarkable. Thyroid unremarkable. Lungs/Pleura: No confluent opacities, effusions or suspicious pulmonary nodules. Upper Abdomen: Enhancing lesion within the right hepatic lobe measures 2.8 cm Musculoskeletal: Numerous lytic destructive osseous lesions noted. The largest is a soft tissue mass involving the posterior left 9th rib with destruction of the rib. Soft tissue mass measures up to 4 cm. Lytic destructive process noted anterior left 6th  rib. Lytic destructive process in the T10 vertebral body and superior aspect of T11. Soft tissue mass causes encroachment on the spinal canal and thecal sac. Lytic expansile lesion also noted in the posterior elements/spinous process at T1. Sclerotic area within the manubrium. Lytic lesion in the tip of the left scapula. Review of the MIP images confirms the above findings. IMPRESSION: No evidence of pulmonary embolus. Multiple lytic destructive expansile lesions within the osseous structures as described above with largest associated soft tissue masses involving the posterior left 9th rib and the T10/T11 vertebral bodies. Findings compatible with metastases. 2.8 cm enhancing lesion within the right hepatic lobe. Cannot exclude metastasis or primary malignancy given the above osseous findings. Coronary artery disease. Aortic Atherosclerosis (ICD10-I70.0). Electronically Signed   By: KRolm BaptiseM.D.   On: 07/26/2022 15:14   DG Chest Port 1 View  Result Date: 07/26/2022 CLINICAL DATA:  Altered mental status.  Elevated heart rate. EXAM: PORTABLE CHEST 1 VIEW COMPARISON:  AP chest 02/09/2022 and 05/28/2020 FINDINGS: The patient is rightward rotated. Within this limitation, cardiac silhouette and mediastinal contours appear within normal limits. Mild calcification within the aortic arch. The lungs appear clear. Subtle left lower lung opacity where previously horizontal linear subsegmental atelectasis was seen on 05/28/2020 radiograph, however this region appeared better aerated on most recent 02/09/2022 radiograph. No pleural effusion or pneumothorax. No acute skeletal abnormality. IMPRESSION: Subtle left lower lung opacity where previously horizontal linear subsegmental atelectasis was seen on 05/28/2020 radiograph, however this region appeared better aerated on most recent 02/09/2022 radiograph. This may represent atelectasis. It is difficult to exclude early pneumonia. If the patient is able, PA and lateral  chest radiographs may help better characterize. Electronically Signed   By: RYvonne KendallM.D.   On: 07/26/2022 12:55    Labs on Admission: I have personally reviewed following labs  CBC: Recent Labs  Lab 07/26/22 1131  WBC 17.1*  NEUTROABS 13.0*  HGB 9.4*  HCT 31.1*  MCV 96.3  PLT 2094  Basic Metabolic Panel: Recent Labs  Lab 07/26/22 1131  NA 139  K 3.4*  CL 103  CO2 28  GLUCOSE 140*  BUN 20  CREATININE 0.58*  CALCIUM 10.2   GFR: Estimated Creatinine Clearance: 52.4 mL/min (A) (by C-G formula based on SCr of 0.58 mg/dL (L)).  Liver Function Tests: Recent Labs  Lab 07/26/22 1131  AST 87*  ALT 108*  ALKPHOS 196*  BILITOT 1.4*  PROT 8.7*  ALBUMIN 3.2*   Coagulation Profile: Recent Labs  Lab 07/26/22 1528  INR 1.3*  Urine analysis:    Component Value Date/Time   COLORURINE AMBER (A) 07/26/2022 1528   APPEARANCEUR CLOUDY (A) 07/26/2022 1528   LABSPEC >1.046 (H) 07/26/2022 1528   PHURINE 6.0 07/26/2022 1528   GLUCOSEU NEGATIVE 07/26/2022 1528   HGBUR LARGE (A) 07/26/2022 1528   BILIRUBINUR NEGATIVE 07/26/2022 1528   KETONESUR 5 (A) 07/26/2022 1528   PROTEINUR 30 (A) 07/26/2022 1528   NITRITE NEGATIVE 07/26/2022 1528   LEUKOCYTESUR LARGE (A) 07/26/2022 1528   CRITICAL CARE Performed by: Dr. Tobie Poet  Total critical care time: 35 minutes  Critical care time was exclusive of separately billable procedures and treating other patients.  Critical care was necessary to treat or prevent imminent or life-threatening deterioration.  Critical care was time spent personally by me on the following activities: development of treatment plan with patient and/or surrogate as well as nursing, discussions with consultants, evaluation of patient's response to treatment, examination of patient, obtaining history from patient or surrogate, ordering and performing treatments and interventions, ordering and review of laboratory studies, ordering and review of radiographic  studies, pulse oximetry and re-evaluation of patient's condition.  This document was prepared using Dragon Voice Recognition software and may include unintentional dictation errors.  Dr. Tobie Poet [facility]  If 7PM-7AM, please contact overnight-coverage provider If 7AM-7PM, please contact day coverage provider [email or web address]  07/26/2022, 7:07 PM

## 2022-07-26 NOTE — ED Notes (Signed)
Lab called for blood draw.

## 2022-07-26 NOTE — Assessment & Plan Note (Signed)
-   Updated POA, [name] over the phone - Oncology, Dr. Cathie Hoops has been consulted at the request of [facility]

## 2022-07-26 NOTE — Consult Note (Signed)
Pharmacy Antibiotic Note  Alan Everett is a 66 y.o. male with PMH including hemorrhagic CVA, bed-bound admitted on 07/26/2022 with sepsis.  Pharmacy has been consulted for vancomycin and cefepime dosing.  Plan:  Cefepime 2 g IV q12h  Vancomycin 1 g IV LD followed maintenance regimen of 750 mg IV q24h --Calculated AUC: 532, Cmin 12.4 --Daily Scr per protocol --Levels at steady state or as clinically indicated  Weight: 40.8 kg (90 lb)  Temp (24hrs), Avg:97.8 F (36.6 C), Min:97.7 F (36.5 C), Max:97.8 F (36.6 C)  Recent Labs  Lab 07/26/22 1131 07/26/22 1528  WBC 17.1*  --   CREATININE 0.58*  --   LATICACIDVEN  --  2.1*    Estimated Creatinine Clearance: 52.4 mL/min (A) (by C-G formula based on SCr of 0.58 mg/dL (L)).    No Known Allergies  Antimicrobials this admission: Ceftriaxone 12/27 x 1 Cefepime 12/27 >>  Vancomycin 12/27 >>   Dose adjustments this admission: N/A  Microbiology results: 12/27 BCx: pending 12/27 UCx: pending  12/27 MRSA PCR: pending  Thank you for allowing pharmacy to be a part of this patient's care.  Tressie Ellis 07/26/2022 5:50 PM

## 2022-07-26 NOTE — ED Notes (Signed)
This RN and Byrd Hesselbach EDT at bedside. In and out cath unsuccessful. Male external catheter placed. Additional lab work and respiratory panel sent.

## 2022-07-26 NOTE — ED Notes (Signed)
Critical lab: Lactic 2.1 DO Amy Cox notified,and acknowledged lab.

## 2022-07-26 NOTE — ED Provider Notes (Signed)
[facility] Provider Note    Event Date/Time   First MD Initiated Contact with Patient 07/26/22 1330     (approximate)   History   Irregular Heart Beat  EM caveat: History of stroke, dysarthria, confusion at baseline  HPI  Alan Everett is a 66 y.o. male who is bedbound history of previous intracranial hemorrhage stroke, presents today for abnormal labs and elevated heart rate.  Per his significant other for about a week she has noticed off-and-on he will be sweating, and they have noticed concerns with an elevated heart rate.  She is concerned has been developing a fever and also has had a very foul urine odor recently.  Patient himself reports no pain.  Denies any complaints.  He is somewhat confused and his speech is slurred but his significant other reports that that has been the case since his stroke and is not new.  He has been behaving and mentating normally    Physical Exam   Triage Vital Signs: ED Triage Vitals [07/26/22 1132]  Enc Vitals Group     BP 112/65     Pulse Rate (!) 131     Resp 18     Temp 97.7 F (36.5 C)     Temp Source Oral     SpO2 96 %     Weight      Height      Head Circumference      [facility]      Pain Score      Pain Loc      Pain Edu?      Excl. in GC?     Most recent vital signs: Vitals:   07/26/22 1440 07/26/22 1530  BP:  130/78  Pulse: (!) 29 (!) 121  Resp: (!) 23 (!) 29  Temp:  97.8 F (36.6 C)  SpO2: 93% 97%     General: Awake, no distress.  CV:  Good peripheral perfusion.  Tachycardia, sinus tachycardia with variability rate about 1 20-1 30 Resp:  Normal effort.  Clear lungs bilaterally.  Speaks with yes no answers, but speech is slightly slurredAbd:  No distention.  Denies abdominal pain to palpation any quadrant no peritonitis noted Other:  Normal circumcised penis and scrotum.  No evidence of erythema or induration or rash in or near the scrotum or perineum   ED Results / Procedures /  Treatments   Labs (all labs ordered are listed, but only abnormal results are displayed) Labs Reviewed  CBC WITH DIFFERENTIAL/PLATELET - Abnormal; Notable for the following components:      Result Value   WBC 17.1 (*)    RBC 3.23 (*)    Hemoglobin 9.4 (*)    HCT 31.1 (*)    RDW 15.6 (*)    Neutro Abs 13.0 (*)    Monocytes Absolute 2.8 (*)    Abs Immature Granulocytes 0.19 (*)    All other components within normal limits  COMPREHENSIVE METABOLIC PANEL - Abnormal; Notable for the following components:   Potassium 3.4 (*)    Glucose, Bld 140 (*)    Creatinine, Ser 0.58 (*)    Total Protein 8.7 (*)    Albumin 3.2 (*)    AST 87 (*)    ALT 108 (*)    Alkaline Phosphatase 196 (*)    Total Bilirubin 1.4 (*)    All other components within normal limits  LACTIC ACID, PLASMA - Abnormal; Notable for the following components:   Lactic Acid,  Venous 2.1 (*)    All other components within normal limits  PROTIME-INR - Abnormal; Notable for the following components:   Prothrombin Time 16.1 (*)    INR 1.3 (*)    All other components within normal limits  RESP PANEL BY RT-PCR (FLU A&B, COVID) ARPGX2  URINE CULTURE  URINE CULTURE  CULTURE, BLOOD (ROUTINE X 2)  CULTURE, BLOOD (ROUTINE X 2)  APTT  LACTIC ACID, PLASMA  URINALYSIS, COMPLETE (UACMP) WITH MICROSCOPIC  HIV ANTIBODY (ROUTINE TESTING W REFLEX)  TROPONIN I (HIGH SENSITIVITY)  TROPONIN I (HIGH SENSITIVITY)     EKG  Entered by me at 1130 heart rate 130 QRS 80 QTc 450 Sinus tachycardia, no evidence of acute ischemia.  Mild nonspecific T wave abnormality   RADIOLOGY Chest x-ray interpreted by me, no obvious infiltrate except for potential left lower lobe infiltrative process.  Radiology read: Subtle left lower lung opacity where previously horizontal linear subsegmental atelectasis was seen on 05/28/2020 radiograph, however this region appeared better aerated on most recent 02/09/2022 radiograph. This may represent  atelectasis. It is difficult to exclude early pneumonia. If the patient is able, PA and lateral chest radiographs may help better characterize.    PROCEDURES:  Critical Care performed: No  Procedures   MEDICATIONS ORDERED IN ED: Medications  traMADol (ULTRAM) tablet 50 mg (has no administration in time range)  acetaminophen (TYLENOL) tablet 650 mg (has no administration in time range)    Or  acetaminophen (TYLENOL) suppository 650 mg (has no administration in time range)  ondansetron (ZOFRAN) tablet 4 mg (has no administration in time range)    Or  ondansetron (ZOFRAN) injection 4 mg (has no administration in time range)  enoxaparin (LOVENOX) injection 40 mg (has no administration in time range)  lactated ringers bolus 1,000 mL (1,000 mLs Intravenous New Bag/Given 07/26/22 1621)  metroNIDAZOLE (FLAGYL) IVPB 500 mg (has no administration in time range)  senna-docusate (Senokot-S) tablet 1 tablet (has no administration in time range)  morphine (PF) 2 MG/ML injection 2 mg (has no administration in time range)  metoprolol tartrate (LOPRESSOR) injection 2.5 mg (has no administration in time range)  vancomycin (VANCOCIN) IVPB 1000 mg/200 mL premix (has no administration in time range)  ceFEPIme (MAXIPIME) 2 g in sodium chloride 0.9 % 100 mL IVPB (has no administration in time range)  sodium chloride 0.9 % bolus 1,000 mL (0 mLs Intravenous Stopped 07/26/22 1617)  iohexol (OMNIPAQUE) 350 MG/ML injection 75 mL (75 mLs Intravenous Contrast Given 07/26/22 1446)  cefTRIAXone (ROCEPHIN) 2 g in sodium chloride 0.9 % 100 mL IVPB (0 g Intravenous Stopped 07/26/22 1613)     IMPRESSION / MDM / ASSESSMENT AND PLAN / ED COURSE  I reviewed the triage vital signs and the nursing notes.                              Differential diagnosis includes, but is not limited to, possible sepsis, pulmonary embolism, anemia, metabolic or other process.  Differential diagnosis quite broad but given his  abnormal urine odor, tachycardia elevated white blood cell count I am suspicious this may be representative of sepsis.  No further workup awaiting source viral testing etc.  Patient's presentation is most consistent with acute presentation with potential threat to life or bodily function.  The patient is on the cardiac monitor to evaluate for evidence of arrhythmia and/or significant heart rate changes.    Patient will be admitted to the hospitalist  service, hospitalist Dr. Sedalia Muta.  Suspect sepsis, also CT findings concerning for possible multiple lytic type lesions.  FINAL CLINICAL IMPRESSION(S) / ED DIAGNOSES   Final diagnoses:  SIRS (systemic inflammatory response syndrome) (HCC)  Lytic lesion of bone on x-ray     Rx / DC Orders   ED Discharge Orders     None        Note:  This document was prepared using Dragon voice recognition software and may include unintentional dictation errors.   Sharyn Creamer, MD 07/26/22 [phone]

## 2022-07-26 NOTE — Hospital Course (Addendum)
[name] is a 66-year-old male with history of intracranial hemorrhagic stroke, bedbound state, polysubstance abuse, history of CVA with aphasia who presents emergency department from [facility] for chief concerns of abnormal labs.  Initial vitals in the ED showed temperature of 97.7, respiration rate of 18, heart rate of 131, blood pressure 112/65, SpO2 of 96% on room air.  Serum sodium is 139, potassium 3.4, chloride 103, bicarb 28, BUN of 20, serum creatinine 0.58, EGFR greater than 60, nonfasting blood glucose 140, WBC 17.1, hemoglobin 9.4, platelets of 292.  Alk phos was elevated at 196, AST 87, ALT of 108.  COVID/influenza A/influenza B PCR have been ordered and is in process. UA is pending collection.  Lactic acid is in process.  ED treatment: Ceftriaxone 2 g IV, sodium chloride 1 L bolus. 

## 2022-07-27 DIAGNOSIS — Z515 Encounter for palliative care: Secondary | ICD-10-CM

## 2022-07-27 DIAGNOSIS — M899 Disorder of bone, unspecified: Secondary | ICD-10-CM

## 2022-07-27 DIAGNOSIS — K769 Liver disease, unspecified: Secondary | ICD-10-CM

## 2022-07-27 DIAGNOSIS — R627 Adult failure to thrive: Secondary | ICD-10-CM

## 2022-07-27 DIAGNOSIS — Z7189 Other specified counseling: Secondary | ICD-10-CM | POA: Diagnosis not present

## 2022-07-27 DIAGNOSIS — Z720 Tobacco use: Secondary | ICD-10-CM

## 2022-07-27 DIAGNOSIS — Z66 Do not resuscitate: Secondary | ICD-10-CM | POA: Diagnosis not present

## 2022-07-27 DIAGNOSIS — I69391 Dysphagia following cerebral infarction: Secondary | ICD-10-CM

## 2022-07-27 DIAGNOSIS — E43 Unspecified severe protein-calorie malnutrition: Secondary | ICD-10-CM

## 2022-07-27 DIAGNOSIS — I612 Nontraumatic intracerebral hemorrhage in hemisphere, unspecified: Secondary | ICD-10-CM

## 2022-07-27 DIAGNOSIS — R6520 Severe sepsis without septic shock: Secondary | ICD-10-CM

## 2022-07-27 LAB — CBC
HCT: 28.7 % — ABNORMAL LOW (ref 39.0–52.0)
Hemoglobin: 8.5 g/dL — ABNORMAL LOW (ref 13.0–17.0)
MCH: 29 pg (ref 26.0–34.0)
MCHC: 29.6 g/dL — ABNORMAL LOW (ref 30.0–36.0)
MCV: 98 fL (ref 80.0–100.0)
Platelets: 275 10*3/uL (ref 150–400)
RBC: 2.93 MIL/uL — ABNORMAL LOW (ref 4.22–5.81)
RDW: 15.5 % (ref 11.5–15.5)
WBC: 18.6 10*3/uL — ABNORMAL HIGH (ref 4.0–10.5)
nRBC: 0 % (ref 0.0–0.2)

## 2022-07-27 LAB — BASIC METABOLIC PANEL
Anion gap: 10 (ref 5–15)
BUN: 13 mg/dL (ref 8–23)
CO2: 25 mmol/L (ref 22–32)
Calcium: 9.8 mg/dL (ref 8.9–10.3)
Chloride: 107 mmol/L (ref 98–111)
Creatinine, Ser: 0.59 mg/dL — ABNORMAL LOW (ref 0.61–1.24)
GFR, Estimated: >60 mL/min (ref >60)
Glucose, Bld: 106 mg/dL — ABNORMAL HIGH (ref 70–99)
Potassium: 3.4 mmol/L — ABNORMAL LOW (ref 3.5–5.1)
Sodium: 142 mmol/L (ref 135–145)

## 2022-07-27 LAB — CBG MONITORING, ED
Glucose-Capillary: 101 mg/dL — ABNORMAL HIGH (ref 70–99)
Glucose-Capillary: 99 mg/dL (ref 70–99)
Glucose-Capillary: 142 mg/dL — ABNORMAL HIGH (ref 70–99)

## 2022-07-27 LAB — HIV ANTIBODY (ROUTINE TESTING W REFLEX): HIV Screen 4th Generation wRfx: NONREACTIVE

## 2022-07-27 MED ORDER — HALOPERIDOL LACTATE 5 MG/ML IJ SOLN: 0.5000 mg | INTRAMUSCULAR | Status: DC | PRN

## 2022-07-27 MED ORDER — ONDANSETRON HCL 4 MG/2ML IJ SOLN: 4.0000 mg | Freq: Four times a day (QID) | INTRAMUSCULAR | Status: DC | PRN

## 2022-07-27 MED ORDER — BIOTENE DRY MOUTH MT LIQD: 15.0000 mL | OROMUCOSAL | Status: DC | PRN

## 2022-07-27 MED ORDER — HALOPERIDOL 0.5 MG PO TABS: 0.5000 mg | ORAL_TABLET | ORAL | Status: DC | PRN

## 2022-07-27 MED ORDER — GLYCOPYRROLATE 1 MG/5ML PO SOLN: 1.0000 mg | Freq: Four times a day (QID) | ORAL | 0 refills | Status: AC | PRN

## 2022-07-27 MED ORDER — GLYCOPYRROLATE 1 MG PO TABS: 1.0000 mg | ORAL_TABLET | ORAL | Status: DC | PRN

## 2022-07-27 MED ORDER — LORAZEPAM 0.5 MG PO TABS: 0.5000 mg | ORAL_TABLET | ORAL | 0 refills | Status: DC | PRN

## 2022-07-27 MED ORDER — GLYCOPYRROLATE 0.2 MG/ML IJ SOLN: 0.2000 mg | INTRAMUSCULAR | Status: DC | PRN

## 2022-07-27 MED ORDER — GLYCOPYRROLATE 1 MG/5ML PO SOLN: 1.0000 mg | Freq: Four times a day (QID) | ORAL | 0 refills | Status: DC | PRN

## 2022-07-27 MED ORDER — LORAZEPAM 2 MG/ML PO CONC: 1.0000 mg | ORAL | Status: DC | PRN

## 2022-07-27 MED ORDER — HALOPERIDOL LACTATE 2 MG/ML PO CONC: 0.5000 mg | ORAL | Status: DC | PRN

## 2022-07-27 MED ORDER — LORAZEPAM 1 MG PO TABS: 1.0000 mg | ORAL_TABLET | ORAL | Status: DC | PRN

## 2022-07-27 MED ORDER — MORPHINE SULFATE (CONCENTRATE) 10 MG /0.5 ML PO SOLN: 10.0000 mg | ORAL | 0 refills | Status: DC | PRN

## 2022-07-27 MED ORDER — ONDANSETRON 4 MG PO TBDP: 4.0000 mg | ORAL_TABLET | Freq: Three times a day (TID) | ORAL | 0 refills | Status: DC | PRN

## 2022-07-27 MED ORDER — POTASSIUM CHLORIDE 20 MEQ PO PACK: 40.0000 meq | PACK | Freq: Once | ORAL | Status: DC

## 2022-07-27 MED ORDER — LORAZEPAM 2 MG/ML IJ SOLN: 1.0000 mg | INTRAMUSCULAR | Status: DC | PRN

## 2022-07-27 MED ORDER — ONDANSETRON 4 MG PO TBDP: 4.0000 mg | ORAL_TABLET | Freq: Four times a day (QID) | ORAL | Status: DC | PRN

## 2022-07-27 MED ORDER — POLYVINYL ALCOHOL 1.4 % OP SOLN: 1.0000 [drp] | Freq: Four times a day (QID) | OPHTHALMIC | Status: DC | PRN

## 2022-07-27 MED ORDER — HYDROMORPHONE HCL 1 MG/ML IJ SOLN: 0.5000 mg | INTRAMUSCULAR | Status: DC | PRN

## 2022-07-27 NOTE — Progress Notes (Signed)
Patient is unable to give necessary information for admission assessment, patient answering every questions by saying Yeah.

## 2022-07-27 NOTE — ED Notes (Signed)
Unable to collect blood cultures. DO aware.

## 2022-07-27 NOTE — [place] FL2 (Addendum)
[place] MEDICAID FL2 LEVEL OF CARE FORM     IDENTIFICATION  Patient Name: Alan Everett Birthdate: 1955-09-02 Sex: male Admission Date (Current Location): 07/26/2022  [facility] and [place] Number:  Chiropodist and Address:  [facility], [address] [place], [place] [address]      Provider Number: [id number]  Attending Physician Name and Address:  Almon Hercules, MD  Relative Name and Phone Number:       Current Level of Care: Hospital Recommended Level of Care: Skilled Nursing Facility with hospice Prior Approval Number:    Date Approved/Denied:   PASRR Number: [id number]  Discharge Plan: SNF with hospice    Current Diagnoses: Patient Active Problem List   Diagnosis Date Noted   Sepsis (HCC) 07/26/2022   Bone lesion 07/26/2022   DNR (do not resuscitate) 07/26/2022   Muscle hypertonicity    Hepatitis C 07/13/2020   Lesion of liver 07/13/2020   Aphasia as late effect of cerebrovascular accident    Pressure injury of skin 06/02/2020   Acute lower UTI 05/29/2020   Acute blood loss anemia 05/29/2020   Heme positive stool    Protein-calorie malnutrition, severe 05/21/2020   Palliative care by specialist    Goals of care, counseling/discussion    DNR (do not resuscitate) discussion    Aspiration pneumonia of both lower lobes (HCC)    Polysubstance abuse (HCC)    Dysphagia due to recent cerebrovascular accident (CVA)    Hyperglycemia    ICH (intracerebral hemorrhage) (HCC) 05/05/2020   Iron deficiency 02/29/2020   Symptomatic anemia 02/28/2020   Microcytic anemia 02/28/2020   Alcohol use 02/28/2020   Tobacco abuse     Orientation RESPIRATION BLADDER Height & Weight     Self  Normal Continent, External catheter Weight: 90 lb (40.8 kg) Height:     BEHAVIORAL SYMPTOMS/MOOD NEUROLOGICAL BOWEL NUTRITION STATUS   (None)  (None) Continent Diet (Follow for discharge recommendations. Currently NPO.)  AMBULATORY STATUS  COMMUNICATION OF NEEDS Skin       Normal                       Personal Care Assistance Level of Assistance              Functional Limitations Info  Sight, Hearing, Speech Sight Info: Adequate Hearing Info: Adequate Speech Info: Adequate    SPECIAL CARE FACTORS FREQUENCY                       Contractures Contractures Info: Present    Additional Factors Info  Code Status, Allergies Code Status Info: DNR Allergies Info: NKDA           Current Medications (07/27/2022):  This is the current hospital active medication list Current Facility-Administered Medications  Medication Dose Route Frequency Provider Last Rate Last Admin   acetaminophen (TYLENOL) tablet 650 mg  650 mg Oral Q6H PRN Cox, Amy N, DO       Or   acetaminophen (TYLENOL) suppository 650 mg  650 mg Rectal Q6H PRN Cox, Amy N, DO       ceFEPIme (MAXIPIME) 2 g in sodium chloride 0.9 % 100 mL IVPB  2 g Intravenous Q12H Tressie Ellis, RPH   Stopped at 07/27/22 0817   enoxaparin (LOVENOX) injection 30 mg  30 mg Subcutaneous Q24H Cox, Amy N, DO   30 mg at 07/27/22 0001   insulin aspart (novoLOG) injection 0-5 Units  0-5  Units Subcutaneous QHS Cox, Amy N, DO       insulin aspart (novoLOG) injection 0-9 Units  0-9 Units Subcutaneous TID WC Cox, Amy N, DO       metoprolol tartrate (LOPRESSOR) injection 2.5 mg  2.5 mg Intravenous Q2H PRN Cox, Amy N, DO       metroNIDAZOLE (FLAGYL) IVPB 500 mg  500 mg Intravenous Q12H Cox, Amy N, DO   Stopped at 07/27/22 7846   morphine (PF) 2 MG/ML injection 2 mg  2 mg Intravenous Q4H PRN Cox, Amy N, DO       ondansetron (ZOFRAN) tablet 4 mg  4 mg Oral Q6H PRN Cox, Amy N, DO       Or   ondansetron (ZOFRAN) injection 4 mg  4 mg Intravenous Q6H PRN Cox, Amy N, DO       senna-docusate (Senokot-S) tablet 1 tablet  1 tablet Oral QHS PRN Cox, Amy N, DO       traMADol (ULTRAM) tablet 50 mg  50 mg Oral TID PRN Cox, Amy N, DO       vancomycin (VANCOREADY) IVPB 750 mg/150 mL  750  mg Intravenous Q24H Tressie Ellis, RPH       Current Outpatient Medications  Medication Sig Dispense Refill   Cholecalciferol 62.5 MCG (2500 UT) CHEW Chew 1 capsule by mouth daily.     famotidine (PEPCID) 40 MG/5ML suspension Take 40 mg by mouth daily.     ferrous sulfate 220 (44 Fe) MG/5ML solution Place 220 mg into feeding tube daily.     sennosides (SENOKOT) 8.8 MG/5ML syrup Place 5 mLs into feeding tube 2 (two) times daily. 240 mL 0   tiZANidine (ZANAFLEX) 2 MG tablet Place 1 tablet (2 mg total) into feeding tube 3 (three) times daily. (Patient taking differently: Place 2 mg into feeding tube every 8 (eight) hours as needed for muscle spasms.) 30 tablet 0   traMADol (ULTRAM) 50 MG tablet Take 50 mg by mouth 3 (three) times daily as needed.     acetaminophen (TYLENOL) 325 MG tablet Place 2 tablets (650 mg total) into feeding tube every 4 (four) hours as needed for mild pain (or temp > 37.5 C (99.5 F)).     baclofen 5 MG TABS Place 5 mg into feeding tube 3 (three) times daily. (Patient not taking: Reported on 07/26/2022) 30 each 0   carvedilol (COREG) 3.125 MG tablet Place 1 tablet (3.125 mg total) into feeding tube 2 (two) times daily with a meal. (Patient not taking: Reported on 07/26/2022) 30 tablet 0   feeding supplement (ENSURE ENLIVE / ENSURE PLUS) LIQD Place 237 mLs into feeding tube 3 (three) times daily between meals. 237 mL 12   hydrocortisone (ANUSOL-HC) 25 MG suppository Place 1 suppository (25 mg total) rectally every 12 (twelve) hours. 12 suppository 1   insulin aspart (NOVOLOG) 100 UNIT/ML injection Inject 0-9 Units into the skin every 4 (four) hours. Correction coverage: Sensitive (thin, NPO, renal)  CBG < 70: Implement Hypoglycemia Standing Orders and refer to Hypoglycemia Standing Orders sidebar report  CBG 70 - 120: 0 units  CBG 121 - 150: 1 unit  CBG 151 - 200: 2 units  CBG 201 - 250: 3 units  CBG 251 - 300: 5 units  CBG 301 - 350: 7 units  CBG 351 - 400 9 units  CBG  > 400 call MD and obtain STAT lab verification (Patient not taking: Reported on 07/26/2022) 10 mL 11   insulin glargine (  LANTUS) 100 UNIT/ML injection Inject 0.05 mLs (5 Units total) into the skin daily. (Patient not taking: Reported on 07/26/2022) 10 mL 11   Iron, Ferrous Sulfate, 325 (65 Fe) MG TABS Take 325 mg by mouth daily. (Patient not taking: Reported on 07/26/2022) 30 tablet 2   nutrition supplement, JUVEN, (JUVEN) PACK Place 1 packet into feeding tube 2 (two) times daily between meals.  0   Nutritional Supplements (FEEDING SUPPLEMENT, OSMOLITE 1.5 CAL,) LIQD Place 840 mLs into feeding tube daily.  0   Nutritional Supplements (FEEDING SUPPLEMENT, PROSOURCE TF,) liquid Place 45 mLs into feeding tube 3 (three) times daily.     oxyCODONE-acetaminophen (PERCOCET) 5-325 MG tablet Take 1 tablet by mouth every 6 (six) hours as needed for severe pain. 20 tablet 0   pantoprazole sodium (PROTONIX) 40 mg/20 mL PACK Place 20 mLs (40 mg total) into feeding tube 2 (two) times daily. (Patient not taking: Reported on 07/26/2022) 30 mL    Water For Irrigation, Sterile (FREE WATER) SOLN Place 200 mLs into feeding tube every 4 (four) hours.       Discharge Medications: Please see discharge summary for a list of discharge medications.  Relevant Imaging Results:  Relevant Lab Results:   Additional Information SS#: [id number]  Margarito Liner, LCSW

## 2022-07-27 NOTE — Consult Note (Signed)
Hematology/Oncology Consult note [phone]) [phone] [phone]      Patient Care Team: Pcp, No as PCP - General Earna CoderBrahmanday, Govinda R, MD as Consulting Physician (Hematology and Oncology)   Name of the patient: Alan Everett  [id number]  [birth year]   REASON FOR COSULTATION:   History of presenting illness-  66 y.o. male with PMH including intracranial hemorrhage stroke, bedbound, polysubstance abuse, history of CVA  who was sent to emergency [facility] for abnormal labs.  Initial lab workup showed WBC of 17.1, hemoglobin 9.4, platelet 292.  Creatinine 0.58, potassium 3.4, sodium 139.  Alkaline phosphatase elevated at 196, AST 87, ALT 108.  Lactic acid is elevated.  Patient is afebrile.  CT chest angiogram multiple lytic destructive expansile lesion within the osseous structures, largest associated soft tissue mass involving the posterior left ninth rib and a T10/T11 vertebral bodies.  Compatible with metastasis.  2.8 cm enhancing lesion within the right hepatic lobe.  Cannot exclude metastasis or primary malignancy.  Coronary artery disease, aortic atherosclerosis.  CT abdomen pelvis showed There are multiple lytic lesions of varying sizes in bony structures in lower thorax, abdomen and pelvis suggesting skeletal metastatic disease. There is a large lytic lesion in the right side of T10 vertebra with associated soft tissue mass causing significant spinal stenosis.   There are low-density lesions in liver suggesting possible hepatic metastatic disease. Follow-up PET-CT and biopsy as warranted should be considered.   There is no evidence of intestinal obstruction or pneumoperitoneum. There is no hydronephrosis.  Bilateral renal stones. There are multiple calcific densities in the dependent portion of urinary bladder suggesting bladder calculi. There are patchy foci of increased density in the right posterior inferior aspect of the urinary bladder  suggesting presence of blood clots or soft tissue mass.  Patient was admitted for sepsis treatment.  He is on antibiotics.  Oncology was consulted for CT findings.  Patient has power of attorney, Alan Everett. No Known Allergies  Patient Active Problem List   Diagnosis Date Noted   Sepsis (HCC) 07/26/2022   Bone lesion 07/26/2022   DNR (do not resuscitate) 07/26/2022   Muscle hypertonicity    Hepatitis C 07/13/2020   Lesion of liver 07/13/2020   Aphasia as late effect of cerebrovascular accident    Pressure injury of skin 06/02/2020   Acute lower UTI 05/29/2020   Acute blood loss anemia 05/29/2020   Heme positive stool    Protein-calorie malnutrition, severe 05/21/2020   Palliative care by specialist    Goals of care, counseling/discussion    DNR (do not resuscitate) discussion    Aspiration pneumonia of both lower lobes (HCC)    Polysubstance abuse (HCC)    Dysphagia due to recent cerebrovascular accident (CVA)    Hyperglycemia    ICH (intracerebral hemorrhage) (HCC) 05/05/2020   Iron deficiency 02/29/2020   Symptomatic anemia 02/28/2020   Microcytic anemia 02/28/2020   Alcohol use 02/28/2020   Tobacco abuse      Past Medical History:  Diagnosis Date   Alcohol use    Aphasia    Chronic viral hepatitis C (HCC)    COVID-19    Dysarthria following nontraumatic intracerebral hemorrhage    Dysphagia, oropharyngeal phase    Epileptic spasms, intractable, with status epilepticus (HCC)    Essential hypertension    Gastro-esophageal reflux disease with esophagitis    Gastrostomy status (HCC)    Iron deficiency    Nontraumatic intracerebral hemorrhage (HCC)    Pressure ulcer of unspecified  buttock, unspecified stage    Spasm of muscle    Tobacco abuse    Type II diabetes mellitus, uncontrolled      Past Surgical History:  Procedure Laterality Date   COLONOSCOPY N/A 03/01/2020   Procedure: COLONOSCOPY;  Surgeon: Toledo, Boykin Nearing, MD;  Location: [facility] ENDOSCOPY;  Service:  Gastroenterology;  Laterality: N/A;   ESOPHAGOGASTRODUODENOSCOPY N/A 03/01/2020   Procedure: ESOPHAGOGASTRODUODENOSCOPY (EGD);  Surgeon: Toledo, Boykin Nearing, MD;  Location: [facility] ENDOSCOPY;  Service: Gastroenterology;  Laterality: N/A;   FOOT FRACTURE SURGERY Left    IR GASTROSTOMY TUBE MOD SED  05/21/2020   IR GASTROSTOMY TUBE MOD SED  11/26/2020   IR GASTROSTOMY TUBE REMOVAL  03/16/2021    Social History   Socioeconomic History   Marital status: Single    Spouse name: Not on file   Number of children: Not on file   Years of education: Not on file   Highest education level: Not on file  Occupational History   Not on file  Tobacco Use   Smoking status: Former   Smokeless tobacco: Never  Substance and Sexual Activity   Alcohol use: Not Currently    Alcohol/week: 6.0 standard drinks of alcohol    Types: 6 Cans of beer per week   Drug use: Never   Sexual activity: Not Currently  Other Topics Concern   Not on file  Social History Narrative   Works part time- Midwife; live sin  with girl friend; 3-4 cig/day; beer.    Social Determinants of Health   Financial Resource Strain: Not on file  Food Insecurity: Not on file  Transportation Needs: Not on file  Physical Activity: Not on file  Stress: Not on file  Social Connections: Not on file  Intimate Partner Violence: Not on file     Family History  Problem Relation Age of Onset   Cancer Mother        his mother died of cancer, but patient is not sure which type of cancner.    Diabetes Mellitus II Niece      Current Facility-Administered Medications:    acetaminophen (TYLENOL) tablet 650 mg, 650 mg, Oral, Q6H PRN **OR** acetaminophen (TYLENOL) suppository 650 mg, 650 mg, Rectal, Q6H PRN, Cox, Amy N, DO   ceFEPIme (MAXIPIME) 2 g in sodium chloride 0.9 % 100 mL IVPB, 2 g, Intravenous, Q12H, Tressie Ellis, RPH, Stopped at 07/27/22 0817   enoxaparin (LOVENOX) injection 30 mg, 30 mg, Subcutaneous, Q24H, Cox, Amy N,  DO, 30 mg at 07/27/22 0001   insulin aspart (novoLOG) injection 0-5 Units, 0-5 Units, Subcutaneous, QHS, Cox, Amy N, DO   insulin aspart (novoLOG) injection 0-9 Units, 0-9 Units, Subcutaneous, TID WC, Cox, Amy N, DO   metoprolol tartrate (LOPRESSOR) injection 2.5 mg, 2.5 mg, Intravenous, Q2H PRN, Cox, Amy N, DO   metroNIDAZOLE (FLAGYL) IVPB 500 mg, 500 mg, Intravenous, Q12H, Cox, Amy N, DO, Stopped at 07/27/22 0608   morphine (PF) 2 MG/ML injection 2 mg, 2 mg, Intravenous, Q4H PRN, Cox, Amy N, DO   ondansetron (ZOFRAN) tablet 4 mg, 4 mg, Oral, Q6H PRN **OR** ondansetron (ZOFRAN) injection 4 mg, 4 mg, Intravenous, Q6H PRN, Cox, Amy N, DO   senna-docusate (Senokot-S) tablet 1 tablet, 1 tablet, Oral, QHS PRN, Cox, Amy N, DO   traMADol (ULTRAM) tablet 50 mg, 50 mg, Oral, TID PRN, Cox, Amy N, DO   vancomycin (VANCOREADY) IVPB 750 mg/150 mL, 750 mg, Intravenous, Q24H, Tressie Ellis, [facility]  Current Outpatient  Medications:    Cholecalciferol 62.5 MCG (2500 UT) CHEW, Chew 1 capsule by mouth daily., Disp: , Rfl:    famotidine (PEPCID) 40 MG/5ML suspension, Take 40 mg by mouth daily., Disp: , Rfl:    ferrous sulfate 220 (44 Fe) MG/5ML solution, Place 220 mg into feeding tube daily., Disp: , Rfl:    sennosides (SENOKOT) 8.8 MG/5ML syrup, Place 5 mLs into feeding tube 2 (two) times daily., Disp: 240 mL, Rfl: 0   tiZANidine (ZANAFLEX) 2 MG tablet, Place 1 tablet (2 mg total) into feeding tube 3 (three) times daily. (Patient taking differently: Place 2 mg into feeding tube every 8 (eight) hours as needed for muscle spasms.), Disp: 30 tablet, Rfl: 0   traMADol (ULTRAM) 50 MG tablet, Take 50 mg by mouth 3 (three) times daily as needed., Disp: , Rfl:    acetaminophen (TYLENOL) 325 MG tablet, Place 2 tablets (650 mg total) into feeding tube every 4 (four) hours as needed for mild pain (or temp > 37.5 C (99.5 F))., Disp: , Rfl:    baclofen 5 MG TABS, Place 5 mg into feeding tube 3 (three) times daily. (Patient not  taking: Reported on 07/26/2022), Disp: 30 each, Rfl: 0   carvedilol (COREG) 3.125 MG tablet, Place 1 tablet (3.125 mg total) into feeding tube 2 (two) times daily with a meal. (Patient not taking: Reported on 07/26/2022), Disp: 30 tablet, Rfl: 0   feeding supplement (ENSURE ENLIVE / ENSURE PLUS) LIQD, Place 237 mLs into feeding tube 3 (three) times daily between meals., Disp: 237 mL, Rfl: 12   hydrocortisone (ANUSOL-HC) 25 MG suppository, Place 1 suppository (25 mg total) rectally every 12 (twelve) hours., Disp: 12 suppository, Rfl: 1   insulin aspart (NOVOLOG) 100 UNIT/ML injection, Inject 0-9 Units into the skin every 4 (four) hours. Correction coverage: Sensitive (thin, NPO, renal)  CBG < 70: Implement Hypoglycemia Standing Orders and refer to Hypoglycemia Standing Orders sidebar report  CBG 70 - 120: 0 units  CBG 121 - 150: 1 unit  CBG 151 - 200: 2 units  CBG 201 - 250: 3 units  CBG 251 - 300: 5 units  CBG 301 - 350: 7 units  CBG 351 - 400 9 units  CBG > 400 call MD and obtain STAT lab verification (Patient not taking: Reported on 07/26/2022), Disp: 10 mL, Rfl: 11   insulin glargine (LANTUS) 100 UNIT/ML injection, Inject 0.05 mLs (5 Units total) into the skin daily. (Patient not taking: Reported on 07/26/2022), Disp: 10 mL, Rfl: 11   Iron, Ferrous Sulfate, 325 (65 Fe) MG TABS, Take 325 mg by mouth daily. (Patient not taking: Reported on 07/26/2022), Disp: 30 tablet, Rfl: 2   nutrition supplement, JUVEN, (JUVEN) PACK, Place 1 packet into feeding tube 2 (two) times daily between meals., Disp: , Rfl: 0   Nutritional Supplements (FEEDING SUPPLEMENT, OSMOLITE 1.5 CAL,) LIQD, Place 840 mLs into feeding tube daily., Disp: , Rfl: 0   Nutritional Supplements (FEEDING SUPPLEMENT, PROSOURCE TF,) liquid, Place 45 mLs into feeding tube 3 (three) times daily., Disp: , Rfl:    oxyCODONE-acetaminophen (PERCOCET) 5-325 MG tablet, Take 1 tablet by mouth every 6 (six) hours as needed for severe pain., Disp: 20 tablet,  Rfl: 0   pantoprazole sodium (PROTONIX) 40 mg/20 mL PACK, Place 20 mLs (40 mg total) into feeding tube 2 (two) times daily. (Patient not taking: Reported on 07/26/2022), Disp: 30 mL, Rfl:    Water For Irrigation, Sterile (FREE WATER) SOLN, Place 200 mLs into feeding  tube every 4 (four) hours., Disp: , Rfl:   Review of Systems  Unable to perform ROS: Mental status change    PHYSICAL EXAM Vitals:   07/27/22 0600 07/27/22 0630 07/27/22 0730 07/27/22 0800  BP: 128/67 129/67 133/65 118/73  Pulse: (!) 114 (!) 115 (!) 112 (!) 111  Resp: 18 18  18   Temp: 98.6 F (37 C)   98 F (36.7 C)  TempSrc:    Axillary  SpO2: 97% 100% 100% 100%  Weight:       Physical Exam HENT:     Head: Normocephalic.  Eyes:     General: No scleral icterus. Cardiovascular:     Pulses: Normal pulses.  Pulmonary:     Effort: Pulmonary effort is normal. No respiratory distress.  Abdominal:     General: Bowel sounds are normal. There is no distension.     Palpations: Abdomen is soft.  Musculoskeletal:        General: No swelling.  Skin:    General: Skin is warm.  Neurological:     Mental Status: He is alert.     Comments: Orientated x 0, not able to have meaningful conversation with him.  Does not follow commands.       LABORATORY STUDIES    Latest Ref Rng & Units 07/27/2022    4:52 AM 07/26/2022   11:31 AM 05/18/2022    8:08 AM  CBC  WBC 4.0 - 10.5 K/uL 18.6  17.1  3.9   Hemoglobin 13.0 - 17.0 g/dL 8.5  9.4  9.9   Hematocrit 39.0 - 52.0 % 28.7  31.1  33.9   Platelets 150 - 400 K/uL 275  292  236       Latest Ref Rng & Units 07/27/2022    4:52 AM 07/26/2022   11:31 AM 05/18/2022    8:08 AM  CMP  Glucose 70 - 99 mg/dL 05/20/2022  681  90   BUN 8 - 23 mg/dL 13  20  12    Creatinine 0.61 - 1.24 mg/dL 157   2.62   Sodium 135 - 145 mmol/L 142  139  139   Potassium 3.5 - 5.1 mmol/L 3.4  3.4  3.6   Chloride 98 - 111 mmol/L 107  103  109   CO2 22 - 32 mmol/L 25  28  26    Calcium 8.9 - 10.3 mg/dL  9.8  0.35  9.1   Total Protein 6.5 - 8.1 g/dL  8.7  8.1   Total Bilirubin 0.3 - 1.2 mg/dL  1.4  0.6   Alkaline Phos 38 - 126 U/L  196  170   AST 15 - 41 U/L  87  54   ALT 0 - 44 U/L  108  46      RADIOGRAPHIC STUDIES: I have personally reviewed the radiological images as listed and agreed with the findings in the report. CT ABDOMEN PELVIS W CONTRAST  Result Date: 07/26/2022 CLINICAL DATA:  Possible sepsis, tachycardia EXAM: CT ABDOMEN AND PELVIS WITH CONTRAST TECHNIQUE: Multidetector CT imaging of the abdomen and pelvis was performed using the standard protocol following bolus administration of intravenous contrast. RADIATION DOSE REDUCTION: This exam was performed according to the departmental dose-optimization program which includes automated exposure control, adjustment of the mA and/or kV according to patient size and/or use of iterative reconstruction technique. CONTRAST:  45mL OMNIPAQUE IOHEXOL 350 MG/ML SOLN COMPARISON:  05/11/2020 FINDINGS: Lower chest: There is a large lytic lesion involving the right side of  body of T10 vertebra with soft tissue mass causing significant spinal stenosis. There is a lytic lesion with adjacent soft tissue mass in the anterior left seventh rib. There is a large lytic lesion in the posterior left tenth rib with associated 5.9 x 3.2 cm soft tissue mass. There is no focal consolidation in the lower lung fields. Hepatobiliary: There is 3.3 x 2.4 cm low-density lesion in the inferior aspect of right lobe of liver. There are few other smaller low-density lesions in the liver. There is no dilation of bile ducts. There is ring-like calcification in the neck of the gallbladder, possibly gallstones or calcification in the wall of gallbladder. Pancreas: No focal abnormalities are seen. Spleen: Unremarkable. Adrenals/Urinary Tract: Adrenals are unremarkable. There is slight prominence of collecting system in left kidney. There are few bilateral renal stones largest in the  lower pole of right kidney measuring 9 mm in size. There is no significant dilation of the ureters. There is mild wall thickening in the left ureter. There are multiple calcific densities in the dependent portion of gallbladder lumen. There are patchy areas of increased density in the right posterolateral aspect of urinary bladder. Stomach/Bowel: Stomach is unremarkable. Small bowel loops are not dilated. Appendix is not distinctly seen. There is no pericecal inflammation. There is no significant wall thickening in colon. Few diverticula are seen in colon without signs of focal diverticulitis. Vascular/Lymphatic: Fairly extensive calcifications are seen in infrarenal aorta and its major branches. Reproductive: There is inhomogeneous attenuation in prostate. Other: There is no ascites or pneumoperitoneum. Musculoskeletal: There is large lytic lesion in left iliac bone adjacent to the upper margin of left acetabulum. There is 5.2 x 4.9 cm associated soft tissue mass. There are other lytic lesions in lumbar vertebrae, especially on the right side at L5 level. There are few other small lytic lesions in the pelvis. There is marked deformity of proximal right femur suggesting hold, possibly ununited fracture in the intertrochanteric region. IMPRESSION: There are multiple lytic lesions of varying sizes in bony structures in lower thorax, abdomen and pelvis suggesting skeletal metastatic disease. There is a large lytic lesion in the right side of T10 vertebra with associated soft tissue mass causing significant spinal stenosis. There are low-density lesions in liver suggesting possible hepatic metastatic disease. Follow-up PET-CT and biopsy as warranted should be considered. There is no evidence of intestinal obstruction or pneumoperitoneum. There is no hydronephrosis. Bilateral renal stones. There are multiple calcific densities in the dependent portion of urinary bladder suggesting bladder calculi. There are patchy foci  of increased density in the right posterior inferior aspect of the urinary bladder suggesting presence of blood clots or soft tissue mass. Aortic arteriosclerosis. Other findings as described in the body of the report. Electronically Signed   By: Ernie Avena M.D.   On: 07/26/2022 15:27   CT Angio Chest PE W and/or Wo Contrast  Result Date: 07/26/2022 CLINICAL DATA:  Pulmonary embolism (PE) suspected, high prob tachycardia, high risk PE. exlcude PE or pneumonia EXAM: CT ANGIOGRAPHY CHEST WITH CONTRAST TECHNIQUE: Multidetector CT imaging of the chest was performed using the standard protocol during bolus administration of intravenous contrast. Multiplanar CT image reconstructions and MIPs were obtained to evaluate the vascular anatomy. RADIATION DOSE REDUCTION: This exam was performed according to the departmental dose-optimization program which includes automated exposure control, adjustment of the mA and/or kV according to patient size and/or use of iterative reconstruction technique. CONTRAST:  75mL OMNIPAQUE IOHEXOL 350 MG/ML SOLN COMPARISON:  05/11/2020 FINDINGS:  Cardiovascular: No filling defects in the pulmonary arteries to suggest pulmonary emboli. Heart is normal size. Aorta is normal caliber. Coronary artery and aortic calcifications. Mediastinum/Nodes: No mediastinal, hilar, or axillary adenopathy. Trachea and esophagus are unremarkable. Thyroid unremarkable. Lungs/Pleura: No confluent opacities, effusions or suspicious pulmonary nodules. Upper Abdomen: Enhancing lesion within the right hepatic lobe measures 2.8 cm Musculoskeletal: Numerous lytic destructive osseous lesions noted. The largest is a soft tissue mass involving the posterior left 9th rib with destruction of the rib. Soft tissue mass measures up to 4 cm. Lytic destructive process noted anterior left 6th rib. Lytic destructive process in the T10 vertebral body and superior aspect of T11. Soft tissue mass causes encroachment on the  spinal canal and thecal sac. Lytic expansile lesion also noted in the posterior elements/spinous process at T1. Sclerotic area within the manubrium. Lytic lesion in the tip of the left scapula. Review of the MIP images confirms the above findings. IMPRESSION: No evidence of pulmonary embolus. Multiple lytic destructive expansile lesions within the osseous structures as described above with largest associated soft tissue masses involving the posterior left 9th rib and the T10/T11 vertebral bodies. Findings compatible with metastases. 2.8 cm enhancing lesion within the right hepatic lobe. Cannot exclude metastasis or primary malignancy given the above osseous findings. Coronary artery disease. Aortic Atherosclerosis (ICD10-I70.0). Electronically Signed   By: Charlett Nose M.D.   On: 07/26/2022 15:14   DG Chest Port 1 View  Result Date: 07/26/2022 CLINICAL DATA:  Altered mental status.  Elevated heart rate. EXAM: PORTABLE CHEST 1 VIEW COMPARISON:  AP chest 02/09/2022 and 05/28/2020 FINDINGS: The patient is rightward rotated. Within this limitation, cardiac silhouette and mediastinal contours appear within normal limits. Mild calcification within the aortic arch. The lungs appear clear. Subtle left lower lung opacity where previously horizontal linear subsegmental atelectasis was seen on 05/28/2020 radiograph, however this region appeared better aerated on most recent 02/09/2022 radiograph. No pleural effusion or pneumothorax. No acute skeletal abnormality. IMPRESSION: Subtle left lower lung opacity where previously horizontal linear subsegmental atelectasis was seen on 05/28/2020 radiograph, however this region appeared better aerated on most recent 02/09/2022 radiograph. This may represent atelectasis. It is difficult to exclude early pneumonia. If the patient is able, PA and lateral chest radiographs may help better characterize. Electronically Signed   By: Neita Garnet M.D.   On: 07/26/2022 12:55      Assessment and plan-   # Right liver lesion, multiple osseous lytic lesion as well as associated soft tissue mass. Highly suspicious for metastatic cancer. I called patient's power of attorney [name] at [phone] and updated her about CT workup findings and is suspicious of widely metastatic cancer. With patient's poor baseline performance status, he is not a candidate for aggressive chemotherapy treatments.  Biopsy could be considered to clarify and confirm diagnosis.  It is also reasonable not proceeding with biopsy given that no future chemotherapy could be offered due to his poor condition.    Alan Everett appreciates the explanation and she is not interested in patient to proceed with aggressive diagnostic procedures.  She is interested in seeking comfort care/hospice. Recommend consult palliative care service for further discussion. Above information was updated to Dr.Gonfa years secure chat.  Thank you for allowing me to participate in the care of this patient.   Rickard Patience, MD, PhD Hematology Oncology 07/27/2022

## 2022-07-27 NOTE — Progress Notes (Addendum)
[facility]) Hospital Liaison Note  Received request from Transitions of Care Manager, Maralyn Sago, for hospice services at LTC after discharge. MSW contacted POA/Mary via telephone call to explain services/hospice philosophy, unfortunately, no answer & unable to leave message.   MSW to attempt contact again on 12.29.   Above information shared with TOC.   Please call with any questions/concerns.    Thank you for the opportunity to participate in this patient's care.   Odette Fraction, MSW [facility] Liaison  [phone]

## 2022-07-27 NOTE — Progress Notes (Signed)
Nutrition Brief Note  Chart reviewed. Pt now transitioning to comfort care.  No further nutrition interventions planned at this time.  Please re-consult as needed.   Alan Everett, RD, LDN, CDCES Registered Dietitian II Certified Diabetes Care and Education Specialist Please refer to AMION for RD and/or RD on-call/weekend/after hours pager   

## 2022-07-27 NOTE — TOC Initial Note (Addendum)
Transition of Care [facility]) - Initial/Assessment Note    Patient Details  Name: Alan Everett MRN: [id number] Date of Birth: 1956-01-28  Transition of Care [facility]) CM/SW Contact:    Margarito Liner, LCSW Phone Number: 07/27/2022, 9:46 AM  Clinical Narrative:  Patient is only oriented to self. CSW called HCPOA (Ms. Tawanna Solo), introduced role, and explained that discharge planning would be discussed. She confirmed patient is a LTC resident at [facility]. CSW will continue to follow progress and facilitate return to facility once medically stable.  1:49 pm: HCPOA prefers that patient return to [facility] with hospice since he knows the staff. Left message for admissions coordinator to confirm they can accommodate this and see what their preferred hospice agency is. HCPOA is okay with whichever facility this is.  4:02 pm: [facility] confirmed they can accept him back with hospice. Prefer [facility]. Made referral to Odette Fraction.  Expected Discharge Plan: Skilled Nursing Facility Barriers to Discharge: Continued Medical Work up   Patient Goals and CMS Choice     Choice offered to / list presented to : [facility] POA / Guardian      Expected Discharge Plan and Services     Post Acute Care Choice: Resumption of Svcs/PTA Provider Living arrangements for the past 2 months: Skilled Nursing Facility                                      Prior Living Arrangements/Services Living arrangements for the past 2 months: Skilled Nursing Facility Lives with:: Facility Resident Patient language and need for interpreter reviewed:: Yes Do you feel safe going back to the place where you live?: Yes      Need for Family Participation in Patient Care: Yes (Comment) Care giver support system in place?: Yes (comment)   Criminal Activity/Legal Involvement Pertinent to Current Situation/Hospitalization: No - Comment as needed  Activities of Daily Living      Permission Sought/Granted Permission sought to  share information with : Facility Medical sales representative, Family Supports    Share Information with NAME: [name]  Permission granted to share info w AGENCY: [facility]  Permission granted to share info w Relationship: Significant other/HCPOA  Permission granted to share info w Contact Information: [phone]  Emotional Assessment   Attitude/Demeanor/Rapport: Unable to Assess Affect (typically observed): Unable to Assess Orientation: : Oriented to Self Alcohol / Substance Use: Not Applicable Psych Involvement: No (comment)  Admission diagnosis:  Sepsis [facility]) [A41.9] Patient Active Problem List   Diagnosis Date Noted   Sepsis (HCC) 07/26/2022   Bone lesion 07/26/2022   DNR (do not resuscitate) 07/26/2022   Muscle hypertonicity    Hepatitis C 07/13/2020   Lesion of liver 07/13/2020   Aphasia as late effect of cerebrovascular accident    Pressure injury of skin 06/02/2020   Acute lower UTI 05/29/2020   Acute blood loss anemia 05/29/2020   Heme positive stool    Protein-calorie malnutrition, severe 05/21/2020   Palliative care by specialist    Goals of care, counseling/discussion    DNR (do not resuscitate) discussion    Aspiration pneumonia of both lower lobes (HCC)    Polysubstance abuse (HCC)    Dysphagia due to recent cerebrovascular accident (CVA)    Hyperglycemia    ICH (intracerebral hemorrhage) (HCC) 05/05/2020   Iron deficiency 02/29/2020   Symptomatic anemia 02/28/2020   Microcytic anemia 02/28/2020   Alcohol use 02/28/2020  Tobacco abuse    PCP:  Pcp, No Pharmacy:   [facility] [address]), [place] - [address] [place]) [place] [address] Phone: [phone] Fax: [phone]     Social Determinants of Health (SDOH) Social History: SDOH Screenings   Tobacco Use: Medium Risk (07/26/2022)   SDOH Interventions:     Readmission Risk Interventions     No data to display

## 2022-07-27 NOTE — ED Notes (Signed)
MD does not need pt to take potassium because he is comfort care.

## 2022-07-27 NOTE — Progress Notes (Signed)
PROGRESS NOTE  Alan Everett QTM:[id number] DOB: 1955-10-17   PCP: Pcp, No  Patient is from: [facility]  DOA: 07/26/2022 LOS: 1  Chief complaints Chief Complaint  Patient presents with   Irregular Heart Beat     Brief Narrative / Interim history: 66 year old M with PMH of ICH/CVA with aphasia, dysphagia, right hemiparesis with contracture currently bedbound with severe malnutrition and failure to thrive, IDDM-2 and prior polysubstance use sent to ED for "abnormal labs, tachycardia and fever" and admitted with SIRS, liver lesion, multiple osseous lytic lesion and associated soft tissue mass and failure to thrive.  CT chest/abdomen/pelvis suspicious for widely metastatic cancer with liver lesion and multiple osseous lytic lesions.  UA with pyuria and hematuria.  Cultures obtained.  Patient was started on broad-spectrum antibiotics.  Oncology consulted.   Oncology recommended against aggressive diagnostic procedure since patient is not a candidate for aggressive chemotherapy given his baseline poor performance status.  Patient's POA, [name] expressed interest in  comfort care and hospice.   I have initiated full comfort care after talking to patient's POA, [name] over the phone.  TOC consulted for potential residential hospice placement.  Patient's RN notified via secure chat.  Subjective: Seen and examined earlier this morning.  Patient seems to be hallucinating as he continuously speaks looking into empty space.  He has some aphasia with dysarthria.  Does not follow command.  Objective: Vitals:   07/27/22 0630 07/27/22 0730 07/27/22 0800 07/27/22 1232  BP: 129/67 133/65 118/73 129/64  Pulse: (!) 115 (!) 112 (!) 111 (!) 111  Resp: 18  18 16   Temp:   98 F (36.7 C) 98.7 F (37.1 C)  TempSrc:   Axillary   SpO2: 100% 100% 100% 99%  Weight:        Examination:  GENERAL: Appears frail and chronically ill. HEENT: MMM.  Vision grossly intact. RESP: On room air.  No IWOB.   Fair aeration bilaterally. CVS:  RRR. Heart sounds normal.  ABD/GI/GU: BS+. Abd soft, NTND.  MSK/EXT: Significant left upper and lower extremity contractures.  Significant muscle mass and subcu fat loss. SKIN: no apparent skin lesion or wound NEURO: Awake, alert and but not oriented.  Does not follow commands.  PSYCH: Calm.  No distress or agitation.  Procedures:  None  Microbiology summarized: COVID-19 and influenza PCR nonreactive. Blood cultures NGTD Urine culture pending  Assessment and plan: Principal Problem:   End of life care Active Problems:   Tobacco abuse   Alcohol use   ICH (intracerebral hemorrhage) (HCC)   Dysphagia due to recent cerebrovascular accident (CVA)   Protein-calorie malnutrition, severe   Liver lesion   Severe sepsis (HCC)   Bone lesion   DNR (do not resuscitate)   Lytic lesion of bone on x-ray  End-of-life care/DNR/DNI-patient with possible metastatic malignancy, failure to thrive and very poor quality of life.  Evaluated by oncology.  Deemed to be not a candidate for aggressive evaluation or chemotherapy. -End-of-life care/full comfort care initiated after discussion with oncology and patient's POA, . -TOC consulted for potential residential hospice placement -Patient's RN notified via secure chat.  Severe sepsis due to possible UTI: POA.  Urinalysis with pyuria.  Had tachycardia, tachypnea, leukocytosis and lactic acidosis.  Blood cultures NGTD. -End-of-life care   Right liver lobe lesion/multiple osseous lytic lesions/soft tissue lesions concerning for metastatic malignancy -Not a candidate for aggressive evaluation on chemotherapy due to baseline poor performance status. -Follow comfort care  History of ICH/CVA with aphasia/dysarthria/dysphagia/right  hemiparesis with contracture  History of IDDM-2  Bandemia  Hypokalemia  Normocytic anemia   Failure to thrive/severe malnutrition Body mass index is 14.53 kg/m.   DVT  prophylaxis:    Code Status: DNR/DNI Family Communication: Updated patient's POA, [name] over the phone Level of care: Med-Surg Status is: Inpatient Remains inpatient appropriate because: Placement, likely residential hospice.   Final disposition: As above Consultants:  Oncology  Sch Meds:  Scheduled Meds:   Continuous Infusions:   PRN Meds:.antiseptic oral rinse, glycopyrrolate **OR** glycopyrrolate **OR** glycopyrrolate, haloperidol **OR** haloperidol **OR** haloperidol lactate, HYDROmorphone (DILAUDID) injection, LORazepam **OR** LORazepam **OR** LORazepam, ondansetron **OR** ondansetron (ZOFRAN) IV, polyvinyl alcohol  Antimicrobials: Anti-infectives (From admission, onward)    Start     Dose/Rate Route Frequency Ordered Stop   07/27/22 2000  vancomycin (VANCOREADY) IVPB 750 mg/150 mL  Status:  Discontinued        750 mg 150 mL/hr over 60 Minutes Intravenous Every 24 hours 07/26/22 1759 07/27/22 1231   07/27/22 0800  ceFEPIme (MAXIPIME) 2 g in sodium chloride 0.9 % 100 mL IVPB  Status:  Discontinued        2 g 200 mL/hr over 30 Minutes Intravenous Every 12 hours 07/26/22 1758 07/27/22 1231   07/26/22 1700  metroNIDAZOLE (FLAGYL) IVPB 500 mg  Status:  Discontinued        500 mg 100 mL/hr over 60 Minutes Intravenous Every 12 hours 07/26/22 1604 07/27/22 1231   07/26/22 1645  vancomycin (VANCOCIN) IVPB 1000 mg/200 mL premix        1,000 mg 200 mL/hr over 60 Minutes Intravenous  Once 07/26/22 1637 07/26/22 2126   07/26/22 1645  ceFEPIme (MAXIPIME) 2 g in sodium chloride 0.9 % 100 mL IVPB        2 g 200 mL/hr over 30 Minutes Intravenous  Once 07/26/22 1638 07/26/22 1812   07/26/22 1545  cefTRIAXone (ROCEPHIN) 2 g in sodium chloride 0.9 % 100 mL IVPB        2 g 200 mL/hr over 30 Minutes Intravenous  Once 07/26/22 1533 07/26/22 1613        I have personally reviewed the following labs and images: CBC: Recent Labs  Lab 07/26/22 1131 07/27/22 0452  WBC 17.1*  18.6*  NEUTROABS 13.0*  --   HGB 9.4* 8.5*  HCT 31.1* 28.7*  MCV 96.3 98.0  PLT 292 275   BMP &GFR Recent Labs  Lab 07/26/22 1131 07/27/22 0452  NA 139 142  K 3.4* 3.4*  CL 103 107  CO2 28 25  GLUCOSE 140* 106*  BUN 20 13  CREATININE 0.58* 0.59*  CALCIUM 10.2 9.8   Estimated Creatinine Clearance: 52.4 mL/min (A) (by C-G formula based on SCr of 0.59 mg/dL (L)). Liver & Pancreas: Recent Labs  Lab 07/26/22 1131  AST 87*  ALT 108*  ALKPHOS 196*  BILITOT 1.4*  PROT 8.7*  ALBUMIN 3.2*   No results for input(s): "LIPASE", "AMYLASE" in the last 168 hours. No results for input(s): "AMMONIA" in the last 168 hours. Diabetic: No results for input(s): "HGBA1C" in the last 72 hours. Recent Labs  Lab 07/26/22 1907 07/26/22 2205 07/27/22 0735 07/27/22 1227  GLUCAP 121* 118* 101* 99   Cardiac Enzymes: No results for input(s): "CKTOTAL", "CKMB", "CKMBINDEX", "TROPONINI" in the last 168 hours. No results for input(s): "PROBNP" in the last 8760 hours. Coagulation Profile: Recent Labs  Lab 07/26/22 1528  INR 1.3*   Thyroid Function Tests: No results for input(s): "TSH", "T4TOTAL", "FREET4", "  T3FREE", "THYROIDAB" in the last 72 hours. Lipid Profile: No results for input(s): "CHOL", "HDL", "LDLCALC", "TRIG", "CHOLHDL", "LDLDIRECT" in the last 72 hours. Anemia Panel: No results for input(s): "VITAMINB12", "FOLATE", "FERRITIN", "TIBC", "IRON", "RETICCTPCT" in the last 72 hours. Urine analysis:    Component Value Date/Time   COLORURINE AMBER (A) 07/26/2022 1528   APPEARANCEUR CLOUDY (A) 07/26/2022 1528   LABSPEC >1.046 (H) 07/26/2022 1528   PHURINE 6.0 07/26/2022 1528   GLUCOSEU NEGATIVE 07/26/2022 1528   HGBUR LARGE (A) 07/26/2022 1528   BILIRUBINUR NEGATIVE 07/26/2022 1528   KETONESUR 5 (A) 07/26/2022 1528   PROTEINUR 30 (A) 07/26/2022 1528   NITRITE NEGATIVE 07/26/2022 1528   LEUKOCYTESUR LARGE (A) 07/26/2022 1528   Sepsis Labs: Invalid input(s): "PROCALCITONIN",  "LACTICIDVEN"  Microbiology: Recent Results (from the past 240 hour(s))  Resp Panel by RT-PCR (Flu A&B, Covid) Anterior Nasal Swab     Status: None   Collection Time: 07/26/22  3:28 PM   Specimen: Anterior Nasal Swab  Result Value Ref Range Status   SARS Coronavirus 2 by RT PCR NEGATIVE NEGATIVE Final    Comment: (NOTE) SARS-CoV-2 target nucleic acids are NOT DETECTED.  The SARS-CoV-2 RNA is generally detectable in upper respiratory specimens during the acute phase of infection. The lowest concentration of SARS-CoV-2 viral copies this assay can detect is 138 copies/mL. A negative result does not preclude SARS-Cov-2 infection and should not be used as the sole basis for treatment or other patient management decisions. A negative result may occur with  improper specimen collection/handling, submission of specimen other than nasopharyngeal swab, presence of viral mutation(s) within the areas targeted by this assay, and inadequate number of viral copies(<138 copies/mL). A negative result must be combined with clinical observations, patient history, and epidemiological information. The expected result is Negative.  Fact Sheet for Patients:  BloggerCourse.comhttps://www.fda.gov/media/152166/download  Fact Sheet for Healthcare Providers:  SeriousBroker.ithttps://www.fda.gov/media/152162/download  This test is no t yet approved or cleared by the [place] FDA and  has been authorized for detection and/or diagnosis of SARS-CoV-2 by FDA under an Emergency Use Authorization (EUA). This EUA will remain  in effect (meaning this test can be used) for the duration of the COVID-19 declaration under Section 564(b)(1) of the Act, 21 U.S.C.section 360bbb-3(b)(1), unless the authorization is terminated  or revoked sooner.       Influenza A by PCR NEGATIVE NEGATIVE Final   Influenza B by PCR NEGATIVE NEGATIVE Final    Comment: (NOTE) The Xpert Xpress SARS-CoV-2/FLU/RSV plus assay is intended as an aid in the diagnosis of  influenza from Nasopharyngeal swab specimens and should not be used as a sole basis for treatment. Nasal washings and aspirates are unacceptable for Xpert Xpress SARS-CoV-2/FLU/RSV testing.  Fact Sheet for Patients: BloggerCourse.comhttps://www.fda.gov/media/152166/download  Fact Sheet for Healthcare Providers: SeriousBroker.ithttps://www.fda.gov/media/152162/download  This test is not yet approved or cleared by the [place] FDA and has been authorized for detection and/or diagnosis of SARS-CoV-2 by FDA under an Emergency Use Authorization (EUA). This EUA will remain in effect (meaning this test can be used) for the duration of the COVID-19 declaration under Section 564(b)(1) of the Act, 21 U.S.C. section 360bbb-3(b)(1), unless the authorization is terminated or revoked.  Performed at [facility], [address] [place], [place] [address]   Culture, blood (Routine X 2) w Reflex to ID Panel     Status: None (Preliminary result)   Collection Time: 07/26/22  5:01 PM   Specimen: BLOOD  Result Value Ref Range Status  Specimen Description BLOOD BLOOD LEFT HAND [facility]  Final   Special Requests   Final    BOTTLES DRAWN AEROBIC AND ANAEROBIC Blood Culture adequate volume   Culture   Final    NO GROWTH < 12 HOURS Performed at [facility], [address] [place], [place] [address]    Report Status PENDING  Incomplete  Culture, blood (Routine X 2) w Reflex to ID Panel     Status: None (Preliminary result)   Collection Time: 07/26/22  5:07 PM   Specimen: BLOOD  Result Value Ref Range Status   Specimen Description BLOOD BLOOD LEFT ARM LEFT WRIST  Final   Special Requests   Final    BOTTLES DRAWN AEROBIC AND ANAEROBIC Blood Culture adequate volume   Culture   Final    NO GROWTH < 12 HOURS Performed at [facility], [address], [place], [place] [address]    Report Status PENDING  Incomplete    Radiology Studies: CT ABDOMEN PELVIS W CONTRAST  Result Date:  07/26/2022 CLINICAL DATA:  Possible sepsis, tachycardia EXAM: CT ABDOMEN AND PELVIS WITH CONTRAST TECHNIQUE: Multidetector CT imaging of the abdomen and pelvis was performed using the standard protocol following bolus administration of intravenous contrast. RADIATION DOSE REDUCTION: This exam was performed according to the departmental dose-optimization program which includes automated exposure control, adjustment of the mA and/or kV according to patient size and/or use of iterative reconstruction technique. CONTRAST:  70mL OMNIPAQUE IOHEXOL 350 MG/ML SOLN COMPARISON:  05/11/2020 FINDINGS: Lower chest: There is a large lytic lesion involving the right side of body of T10 vertebra with soft tissue mass causing significant spinal stenosis. There is a lytic lesion with adjacent soft tissue mass in the anterior left seventh rib. There is a large lytic lesion in the posterior left tenth rib with associated 5.9 x 3.2 cm soft tissue mass. There is no focal consolidation in the lower lung fields. Hepatobiliary: There is 3.3 x 2.4 cm low-density lesion in the inferior aspect of right lobe of liver. There are few other smaller low-density lesions in the liver. There is no dilation of bile ducts. There is ring-like calcification in the neck of the gallbladder, possibly gallstones or calcification in the wall of gallbladder. Pancreas: No focal abnormalities are seen. Spleen: Unremarkable. Adrenals/Urinary Tract: Adrenals are unremarkable. There is slight prominence of collecting system in left kidney. There are few bilateral renal stones largest in the lower pole of right kidney measuring 9 mm in size. There is no significant dilation of the ureters. There is mild wall thickening in the left ureter. There are multiple calcific densities in the dependent portion of gallbladder lumen. There are patchy areas of increased density in the right posterolateral aspect of urinary bladder. Stomach/Bowel: Stomach is unremarkable. Small  bowel loops are not dilated. Appendix is not distinctly seen. There is no pericecal inflammation. There is no significant wall thickening in colon. Few diverticula are seen in colon without signs of focal diverticulitis. Vascular/Lymphatic: Fairly extensive calcifications are seen in infrarenal aorta and its major branches. Reproductive: There is inhomogeneous attenuation in prostate. Other: There is no ascites or pneumoperitoneum. Musculoskeletal: There is large lytic lesion in left iliac bone adjacent to the upper margin of left acetabulum. There is 5.2 x 4.9 cm associated soft tissue mass. There are other lytic lesions in lumbar vertebrae, especially on the right side at L5 level. There are few other small lytic lesions in the pelvis. There is marked deformity of proximal right femur  suggesting hold, possibly ununited fracture in the intertrochanteric region. IMPRESSION: There are multiple lytic lesions of varying sizes in bony structures in lower thorax, abdomen and pelvis suggesting skeletal metastatic disease. There is a large lytic lesion in the right side of T10 vertebra with associated soft tissue mass causing significant spinal stenosis. There are low-density lesions in liver suggesting possible hepatic metastatic disease. Follow-up PET-CT and biopsy as warranted should be considered. There is no evidence of intestinal obstruction or pneumoperitoneum. There is no hydronephrosis. Bilateral renal stones. There are multiple calcific densities in the dependent portion of urinary bladder suggesting bladder calculi. There are patchy foci of increased density in the right posterior inferior aspect of the urinary bladder suggesting presence of blood clots or soft tissue mass. Aortic arteriosclerosis. Other findings as described in the body of the report. Electronically Signed   By: Ernie Avena M.D.   On: 07/26/2022 15:27   CT Angio Chest PE W and/or Wo Contrast  Result Date: 07/26/2022 CLINICAL DATA:   Pulmonary embolism (PE) suspected, high prob tachycardia, high risk PE. exlcude PE or pneumonia EXAM: CT ANGIOGRAPHY CHEST WITH CONTRAST TECHNIQUE: Multidetector CT imaging of the chest was performed using the standard protocol during bolus administration of intravenous contrast. Multiplanar CT image reconstructions and MIPs were obtained to evaluate the vascular anatomy. RADIATION DOSE REDUCTION: This exam was performed according to the departmental dose-optimization program which includes automated exposure control, adjustment of the mA and/or kV according to patient size and/or use of iterative reconstruction technique. CONTRAST:  50mL OMNIPAQUE IOHEXOL 350 MG/ML SOLN COMPARISON:  05/11/2020 FINDINGS: Cardiovascular: No filling defects in the pulmonary arteries to suggest pulmonary emboli. Heart is normal size. Aorta is normal caliber. Coronary artery and aortic calcifications. Mediastinum/Nodes: No mediastinal, hilar, or axillary adenopathy. Trachea and esophagus are unremarkable. Thyroid unremarkable. Lungs/Pleura: No confluent opacities, effusions or suspicious pulmonary nodules. Upper Abdomen: Enhancing lesion within the right hepatic lobe measures 2.8 cm Musculoskeletal: Numerous lytic destructive osseous lesions noted. The largest is a soft tissue mass involving the posterior left 9th rib with destruction of the rib. Soft tissue mass measures up to 4 cm. Lytic destructive process noted anterior left 6th rib. Lytic destructive process in the T10 vertebral body and superior aspect of T11. Soft tissue mass causes encroachment on the spinal canal and thecal sac. Lytic expansile lesion also noted in the posterior elements/spinous process at T1. Sclerotic area within the manubrium. Lytic lesion in the tip of the left scapula. Review of the MIP images confirms the above findings. IMPRESSION: No evidence of pulmonary embolus. Multiple lytic destructive expansile lesions within the osseous structures as described  above with largest associated soft tissue masses involving the posterior left 9th rib and the T10/T11 vertebral bodies. Findings compatible with metastases. 2.8 cm enhancing lesion within the right hepatic lobe. Cannot exclude metastasis or primary malignancy given the above osseous findings. Coronary artery disease. Aortic Atherosclerosis (ICD10-I70.0). Electronically Signed   By: Charlett Nose M.D.   On: 07/26/2022 15:14      Addisyn Leclaire T. Shanetha Bradham [facility]  If 7PM-7AM, please contact night-coverage [email or web address] 07/27/2022, 12:50 PM

## 2022-07-27 NOTE — ED Notes (Signed)
Pt resting comfortably. His brief and linen was changed. Pt offered snacks and juice.

## 2022-07-27 NOTE — ED Notes (Signed)
Pts partner at bedside feeing pt apple sauce and apple juice.

## 2022-07-28 DIAGNOSIS — M899 Disorder of bone, unspecified: Secondary | ICD-10-CM | POA: Diagnosis not present

## 2022-07-28 DIAGNOSIS — I69391 Dysphagia following cerebral infarction: Secondary | ICD-10-CM | POA: Diagnosis not present

## 2022-07-28 DIAGNOSIS — Z515 Encounter for palliative care: Secondary | ICD-10-CM | POA: Diagnosis not present

## 2022-07-28 DIAGNOSIS — Z66 Do not resuscitate: Secondary | ICD-10-CM | POA: Diagnosis not present

## 2022-07-28 LAB — HEMOGLOBIN A1C
Hgb A1c MFr Bld: <4.2 % — ABNORMAL LOW (ref 4.8–5.6)
Mean Plasma Glucose: <74 mg/dL

## 2022-07-28 NOTE — Discharge Summary (Signed)
Physician Discharge Summary  Alan Everett [id number] DOB: Sep 29, 1955 DOA: 07/26/2022  PCP: Merryl Hacker, No  Admit date: 07/26/2022 Discharge date: 07/28/2022 Admitted From: SNF Disposition: SNF with hospice Recommendations for Outpatient Follow-up:  Hospice follow-up at SNF  Discharge Condition: Stable for transfer CODE STATUS: DNR/DNI  Contact information for after-discharge care     Destination     [facility] Preferred SNF .   Service: Skilled Nursing Contact information: [facility] course 66 year old M with PMH of ICH/CVA with aphasia, dysphagia, right hemiparesis with contracture currently bedbound with severe malnutrition and failure to thrive, IDDM-2 and prior polysubstance use sent to ED for "abnormal labs, tachycardia and fever" and admitted with SIRS, liver lesion, multiple osseous lytic lesion and associated soft tissue mass and failure to thrive.  CT chest/abdomen/pelvis suspicious for widely metastatic cancer with liver lesion and multiple osseous lytic lesions.  UA with pyuria and hematuria.  Cultures obtained.  Patient was started on broad-spectrum antibiotics.  Oncology consulted.   Oncology recommended against aggressive diagnostic procedure since patient is not a candidate for aggressive chemotherapy given his baseline poor performance status.  Patient's POA, [name] expressed interest in  comfort care and hospice.  Full comfort care initiated after talking to [facility] on 07/27/2022.  Patient returning to SNF with hospice.   See individual problem list below for more.   Problems addressed during this hospitalization Principal Problem:   End of life care Active Problems:   Tobacco abuse   Alcohol use   ICH (intracerebral hemorrhage) (HCC)   Dysphagia due to recent cerebrovascular accident (CVA)   Protein-calorie malnutrition, severe   Liver lesion    Severe sepsis (HCC)   Bone lesion   DNR (do not resuscitate)   Lytic lesion of bone on x-ray   End-of-life care/DNR/DNI-patient with possible metastatic malignancy, failure to thrive and very poor quality of life.  Evaluated by oncology.  Deemed to be not a candidate for aggressive evaluation or chemotherapy. -End-of-life care/full comfort care initiated after discussion with oncology and patient's POA, [name]. -Patient returns to SNF with hospice -Rx for Roxanol, Ativan, glycopyrrolate and Zofran issued.   Severe sepsis due to possible UTI: POA.  Urinalysis with pyuria.  Had tachycardia, tachypnea, leukocytosis and lactic acidosis.  Blood cultures NGTD. -End-of-life care   Right liver lobe lesion/multiple osseous lytic lesions/soft tissue lesions concerning for metastatic malignancy -Not a candidate for aggressive evaluation on chemotherapy due to baseline poor performance status. -Follow comfort care   History of ICH/CVA with aphasia/dysarthria/dysphagia/right hemiparesis with contracture   History of IDDM-2   Bandemia   Hypokalemia   Normocytic anemia   Failure to thrive/severe malnutrition         Pressure Injury 06/02/20 Buttocks Right Stage 2 -  Partial thickness loss of dermis presenting as a shallow open injury with a red, pink wound bed without slough. 7.5 cm in lenth by 4.5 width  (Active)  06/02/20 0800  Location: Buttocks  Location Orientation: Right  Staging: Stage 2 -  Partial thickness loss of dermis presenting as a shallow open injury with a red, pink wound bed without slough.  Wound Description (Comments): 7.5 cm in lenth by 4.5 width   Present on Admission:     Vital signs Vitals:   07/27/22 1659 07/27/22 2110 07/27/22 2216 07/28/22 0847  BP: 117/60  92/70 131/65 115/66  Pulse: (!) 111 (!) 107 98 (!) 108  Temp: 98.9 F (37.2 C) 98.6 F (37 C) 98.4 F (36.9 C) 98 F (36.7 C)  Resp: 16 16 16 16   Weight:      SpO2: 99% 99% 100% 100%  TempSrc:  Oral  Axillary      Discharge exam  GENERAL: Appears frail and chronically ill. HEENT: MMM.  Vision grossly intact. RESP: On room air.  No IWOB.  Fair aeration bilaterally. CVS:  RRR. Heart sounds normal.  ABD/GI/GU: BS+. Abd soft, NTND.  MSK/EXT: Significant left upper and lower extremity contractures.  Significant muscle mass and subcu fat loss. SKIN: no apparent skin lesion or wound NEURO: Awake, alert and but not oriented.  Does not follow commands.  Right hemiparesis with contractures.  Aphasia/dysarthria PSYCH: Calm.  No distress or agitation.  Discharge Instructions  Allergies as of 07/28/2022   No Known Allergies      Medication List     STOP taking these medications    acetaminophen 325 MG tablet Commonly known as: TYLENOL   Baclofen 5 MG Tabs   carvedilol 3.125 MG tablet Commonly known as: COREG   Cholecalciferol 62.5 MCG (2500 UT) Chew   famotidine 40 MG/5ML suspension Commonly known as: PEPCID   feeding supplement (OSMOLITE 1.5 CAL) Liqd   feeding supplement (PROSource TF) liquid   feeding supplement Liqd   ferrous sulfate 220 (44 Fe) MG/5ML solution   free water Soln   hydrocortisone 25 MG suppository Commonly known as: ANUSOL-HC   insulin aspart 100 UNIT/ML injection Commonly known as: novoLOG   insulin glargine 100 UNIT/ML injection Commonly known as: LANTUS   Iron (Ferrous Sulfate) 325 (65 Fe) MG Tabs   nutrition supplement (JUVEN) Pack   oxyCODONE-acetaminophen 5-325 MG tablet Commonly known as: Percocet   pantoprazole sodium 40 mg Commonly known as: PROTONIX   sennosides 8.8 MG/5ML syrup Commonly known as: SENOKOT   tiZANidine 2 MG tablet Commonly known as: ZANAFLEX   traMADol 50 MG tablet Commonly known as: ULTRAM       TAKE these medications    Glycopyrrolate 1 MG/5ML Soln Take 5 mLs (1 mg total) by mouth 4 (four) times daily as needed for up to 3 days.   LORazepam 0.5 MG tablet Commonly known as: Ativan Take  1 tablet (0.5 mg total) by mouth every 4 (four) hours as needed for up to 20 doses for anxiety.   morphine CONCENTRATE 10 mg / 0.5 ml concentrated solution Take 0.5 mLs (10 mg total) by mouth every 3 (three) hours as needed for moderate pain, severe pain or shortness of breath.   ondansetron 4 MG disintegrating tablet Commonly known as: ZOFRAN-ODT Take 1 tablet (4 mg total) by mouth every 8 (eight) hours as needed for nausea or vomiting.        Consultations: Oncology  Procedures/Studies:   CT ABDOMEN PELVIS W CONTRAST  Result Date: 07/26/2022 CLINICAL DATA:  Possible sepsis, tachycardia EXAM: CT ABDOMEN AND PELVIS WITH CONTRAST TECHNIQUE: Multidetector CT imaging of the abdomen and pelvis was performed using the standard protocol following bolus administration of intravenous contrast. RADIATION DOSE REDUCTION: This exam was performed according to the departmental dose-optimization program which includes automated exposure control, adjustment of the mA and/or kV according to patient size and/or use of iterative reconstruction technique. CONTRAST:  30mL OMNIPAQUE IOHEXOL 350 MG/ML SOLN COMPARISON:  05/11/2020 FINDINGS: Lower chest: There is a large lytic lesion involving the right side of body of T10  vertebra with soft tissue mass causing significant spinal stenosis. There is a lytic lesion with adjacent soft tissue mass in the anterior left seventh rib. There is a large lytic lesion in the posterior left tenth rib with associated 5.9 x 3.2 cm soft tissue mass. There is no focal consolidation in the lower lung fields. Hepatobiliary: There is 3.3 x 2.4 cm low-density lesion in the inferior aspect of right lobe of liver. There are few other smaller low-density lesions in the liver. There is no dilation of bile ducts. There is ring-like calcification in the neck of the gallbladder, possibly gallstones or calcification in the wall of gallbladder. Pancreas: No focal abnormalities are seen. Spleen:  Unremarkable. Adrenals/Urinary Tract: Adrenals are unremarkable. There is slight prominence of collecting system in left kidney. There are few bilateral renal stones largest in the lower pole of right kidney measuring 9 mm in size. There is no significant dilation of the ureters. There is mild wall thickening in the left ureter. There are multiple calcific densities in the dependent portion of gallbladder lumen. There are patchy areas of increased density in the right posterolateral aspect of urinary bladder. Stomach/Bowel: Stomach is unremarkable. Small bowel loops are not dilated. Appendix is not distinctly seen. There is no pericecal inflammation. There is no significant wall thickening in colon. Few diverticula are seen in colon without signs of focal diverticulitis. Vascular/Lymphatic: Fairly extensive calcifications are seen in infrarenal aorta and its major branches. Reproductive: There is inhomogeneous attenuation in prostate. Other: There is no ascites or pneumoperitoneum. Musculoskeletal: There is large lytic lesion in left iliac bone adjacent to the upper margin of left acetabulum. There is 5.2 x 4.9 cm associated soft tissue mass. There are other lytic lesions in lumbar vertebrae, especially on the right side at L5 level. There are few other small lytic lesions in the pelvis. There is marked deformity of proximal right femur suggesting hold, possibly ununited fracture in the intertrochanteric region. IMPRESSION: There are multiple lytic lesions of varying sizes in bony structures in lower thorax, abdomen and pelvis suggesting skeletal metastatic disease. There is a large lytic lesion in the right side of T10 vertebra with associated soft tissue mass causing significant spinal stenosis. There are low-density lesions in liver suggesting possible hepatic metastatic disease. Follow-up PET-CT and biopsy as warranted should be considered. There is no evidence of intestinal obstruction or pneumoperitoneum.  There is no hydronephrosis. Bilateral renal stones. There are multiple calcific densities in the dependent portion of urinary bladder suggesting bladder calculi. There are patchy foci of increased density in the right posterior inferior aspect of the urinary bladder suggesting presence of blood clots or soft tissue mass. Aortic arteriosclerosis. Other findings as described in the body of the report. Electronically Signed   By: Elmer Picker M.D.   On: 07/26/2022 15:27   CT Angio Chest PE W and/or Wo Contrast  Result Date: 07/26/2022 CLINICAL DATA:  Pulmonary embolism (PE) suspected, high prob tachycardia, high risk PE. exlcude PE or pneumonia EXAM: CT ANGIOGRAPHY CHEST WITH CONTRAST TECHNIQUE: Multidetector CT imaging of the chest was performed using the standard protocol during bolus administration of intravenous contrast. Multiplanar CT image reconstructions and MIPs were obtained to evaluate the vascular anatomy. RADIATION DOSE REDUCTION: This exam was performed according to the departmental dose-optimization program which includes automated exposure control, adjustment of the mA and/or kV according to patient size and/or use of iterative reconstruction technique. CONTRAST:  18mL OMNIPAQUE IOHEXOL 350 MG/ML SOLN COMPARISON:  05/11/2020 FINDINGS: Cardiovascular: No filling  defects in the pulmonary arteries to suggest pulmonary emboli. Heart is normal size. Aorta is normal caliber. Coronary artery and aortic calcifications. Mediastinum/Nodes: No mediastinal, hilar, or axillary adenopathy. Trachea and esophagus are unremarkable. Thyroid unremarkable. Lungs/Pleura: No confluent opacities, effusions or suspicious pulmonary nodules. Upper Abdomen: Enhancing lesion within the right hepatic lobe measures 2.8 cm Musculoskeletal: Numerous lytic destructive osseous lesions noted. The largest is a soft tissue mass involving the posterior left 9th rib with destruction of the rib. Soft tissue mass measures up to 4  cm. Lytic destructive process noted anterior left 6th rib. Lytic destructive process in the T10 vertebral body and superior aspect of T11. Soft tissue mass causes encroachment on the spinal canal and thecal sac. Lytic expansile lesion also noted in the posterior elements/spinous process at T1. Sclerotic area within the manubrium. Lytic lesion in the tip of the left scapula. Review of the MIP images confirms the above findings. IMPRESSION: No evidence of pulmonary embolus. Multiple lytic destructive expansile lesions within the osseous structures as described above with largest associated soft tissue masses involving the posterior left 9th rib and the T10/T11 vertebral bodies. Findings compatible with metastases. 2.8 cm enhancing lesion within the right hepatic lobe. Cannot exclude metastasis or primary malignancy given the above osseous findings. Coronary artery disease. Aortic Atherosclerosis (ICD10-I70.0). Electronically Signed   By: Rolm Baptise M.D.   On: 07/26/2022 15:14   DG Chest Port 1 View  Result Date: 07/26/2022 CLINICAL DATA:  Altered mental status.  Elevated heart rate. EXAM: PORTABLE CHEST 1 VIEW COMPARISON:  AP chest 02/09/2022 and 05/28/2020 FINDINGS: The patient is rightward rotated. Within this limitation, cardiac silhouette and mediastinal contours appear within normal limits. Mild calcification within the aortic arch. The lungs appear clear. Subtle left lower lung opacity where previously horizontal linear subsegmental atelectasis was seen on 05/28/2020 radiograph, however this region appeared better aerated on most recent 02/09/2022 radiograph. No pleural effusion or pneumothorax. No acute skeletal abnormality. IMPRESSION: Subtle left lower lung opacity where previously horizontal linear subsegmental atelectasis was seen on 05/28/2020 radiograph, however this region appeared better aerated on most recent 02/09/2022 radiograph. This may represent atelectasis. It is difficult to exclude early  pneumonia. If the patient is able, PA and lateral chest radiographs may help better characterize. Electronically Signed   By: Yvonne Kendall M.D.   On: 07/26/2022 12:55       The results of significant diagnostics from this hospitalization (including imaging, microbiology, ancillary and laboratory) are listed below for reference.     Microbiology: Recent Results (from the past 240 hour(s))  Resp Panel by RT-PCR (Flu A&B, Covid) Anterior Nasal Swab     Status: None   Collection Time: 07/26/22  3:28 PM   Specimen: Anterior Nasal Swab  Result Value Ref Range Status   SARS Coronavirus 2 by RT PCR NEGATIVE NEGATIVE Final    Comment: (NOTE) SARS-CoV-2 target nucleic acids are NOT DETECTED.  The SARS-CoV-2 RNA is generally detectable in upper respiratory specimens during the acute phase of infection. The lowest concentration of SARS-CoV-2 viral copies this assay can detect is 138 copies/mL. A negative result does not preclude SARS-Cov-2 infection and should not be used as the sole basis for treatment or other patient management decisions. A negative result may occur with  improper specimen collection/handling, submission of specimen other than nasopharyngeal swab, presence of viral mutation(s) within the areas targeted by this assay, and inadequate number of viral copies(<138 copies/mL). A negative result must be combined with clinical observations, patient  history, and epidemiological information. The expected result is Negative.  Fact Sheet for Patients:  BloggerCourse.com  Fact Sheet for Healthcare Providers:  SeriousBroker.it  This test is no t yet approved or cleared by the [place] FDA and  has been authorized for detection and/or diagnosis of SARS-CoV-2 by FDA under an Emergency Use Authorization (EUA). This EUA will remain  in effect (meaning this test can be used) for the duration of the COVID-19 declaration under Section  564(b)(1) of the Act, 21 U.S.C.section 360bbb-3(b)(1), unless the authorization is terminated  or revoked sooner.       Influenza A by PCR NEGATIVE NEGATIVE Final   Influenza B by PCR NEGATIVE NEGATIVE Final    Comment: (NOTE) The Xpert Xpress SARS-CoV-2/FLU/RSV plus assay is intended as an aid in the diagnosis of influenza from Nasopharyngeal swab specimens and should not be used as a sole basis for treatment. Nasal washings and aspirates are unacceptable for Xpert Xpress SARS-CoV-2/FLU/RSV testing.  Fact Sheet for Patients: BloggerCourse.com  Fact Sheet for Healthcare Providers: SeriousBroker.it  This test is not yet approved or cleared by the [place] FDA and has been authorized for detection and/or diagnosis of SARS-CoV-2 by FDA under an Emergency Use Authorization (EUA). This EUA will remain in effect (meaning this test can be used) for the duration of the COVID-19 declaration under Section 564(b)(1) of the Act, 21 U.S.C. section 360bbb-3(b)(1), unless the authorization is terminated or revoked.  Performed at [facility], [address], [place], [place] [address]   Urine Culture     Status: None (Preliminary result)   Collection Time: 07/26/22  4:39 PM   Specimen: Urine, Random  Result Value Ref Range Status   Specimen Description   Final    URINE, RANDOM Performed at [facility], [address] [place], [place] [address]    Special Requests   Final    NONE Performed at [facility], [address] [place], [place] [address]    Culture   Final    CULTURE REINCUBATED FOR BETTER GROWTH 50,000 COLONIES/mL GRAM NEGATIVE RODS    Report Status PENDING  Incomplete  Culture, blood (Routine X 2) w Reflex to ID Panel     Status: None (Preliminary result)   Collection Time: 07/26/22  5:01 PM   Specimen: BLOOD  Result Value Ref Range Status   Specimen Description BLOOD BLOOD LEFT  HAND [facility]  Final   Special Requests   Final    BOTTLES DRAWN AEROBIC AND ANAEROBIC Blood Culture adequate volume   Culture   Final    NO GROWTH 2 DAYS Performed at [facility], [address] [place], [place] [address]    Report Status PENDING  Incomplete  Culture, blood (Routine X 2) w Reflex to ID Panel     Status: None (Preliminary result)   Collection Time: 07/26/22  5:07 PM   Specimen: BLOOD  Result Value Ref Range Status   Specimen Description BLOOD BLOOD LEFT ARM LEFT WRIST  Final   Special Requests   Final    BOTTLES DRAWN AEROBIC AND ANAEROBIC Blood Culture adequate volume   Culture   Final    NO GROWTH 2 DAYS Performed at [facility], [address], [place], [place] [address]    Report Status PENDING  Incomplete     Labs:  CBC: Recent Labs  Lab 07/26/22 1131 07/27/22 0452  WBC 17.1* 18.6*  NEUTROABS 13.0*  --   HGB 9.4* 8.5*  HCT  31.1* 28.7*  MCV 96.3 98.0  PLT 292 275   BMP &GFR Recent Labs  Lab 07/26/22 1131 07/27/22 0452  NA 139 142  K 3.4* 3.4*  CL 103 107  CO2 28 25  GLUCOSE 140* 106*  BUN 20 13  CREATININE 0.58* 0.59*  CALCIUM 10.2 9.8   Estimated Creatinine Clearance: 52.4 mL/min (A) (by C-G formula based on SCr of 0.59 mg/dL (L)). Liver & Pancreas: Recent Labs  Lab 07/26/22 1131  AST 87*  ALT 108*  ALKPHOS 196*  BILITOT 1.4*  PROT 8.7*  ALBUMIN 3.2*   No results for input(s): "LIPASE", "AMYLASE" in the last 168 hours. No results for input(s): "AMMONIA" in the last 168 hours. Diabetic: No results for input(s): "HGBA1C" in the last 72 hours. Recent Labs  Lab 07/26/22 1907 07/26/22 2205 07/27/22 0735 07/27/22 1227 07/27/22 1808  GLUCAP 121* 118* 101* 99 142*   Cardiac Enzymes: No results for input(s): "CKTOTAL", "CKMB", "CKMBINDEX", "TROPONINI" in the last 168 hours. No results for input(s): "PROBNP" in the last 8760 hours. Coagulation Profile: Recent Labs  Lab 07/26/22 1528  INR 1.3*   Thyroid  Function Tests: No results for input(s): "TSH", "T4TOTAL", "FREET4", "T3FREE", "THYROIDAB" in the last 72 hours. Lipid Profile: No results for input(s): "CHOL", "HDL", "LDLCALC", "TRIG", "CHOLHDL", "LDLDIRECT" in the last 72 hours. Anemia Panel: No results for input(s): "VITAMINB12", "FOLATE", "FERRITIN", "TIBC", "IRON", "RETICCTPCT" in the last 72 hours. Urine analysis:    Component Value Date/Time   COLORURINE AMBER (A) 07/26/2022 1528   APPEARANCEUR CLOUDY (A) 07/26/2022 1528   LABSPEC >1.046 (H) 07/26/2022 1528   PHURINE 6.0 07/26/2022 1528   GLUCOSEU NEGATIVE 07/26/2022 1528   HGBUR LARGE (A) 07/26/2022 1528   BILIRUBINUR NEGATIVE 07/26/2022 1528   KETONESUR 5 (A) 07/26/2022 1528   PROTEINUR 30 (A) 07/26/2022 1528   NITRITE NEGATIVE 07/26/2022 1528   LEUKOCYTESUR LARGE (A) 07/26/2022 1528   Sepsis Labs: Invalid input(s): "PROCALCITONIN", "LACTICIDVEN"   SIGNED:  Mercy Riding, MD  [facility] 07/28/2022, 10:49 AM

## 2022-07-28 NOTE — Care Management Important Message (Signed)
Important Message  Patient Details  Name: Alan Everett MRN: [id number] Date of Birth: 1955/10/12   Medicare Important Message Given:  Other (see comment)  Patient is on comfort care measures and will return to LTC with Hospice. Out of respect for the patient and family no Important Message from [facility] given.   Alan Everett 07/28/2022, 10:44 AM

## 2022-07-28 NOTE — Care Management Important Message (Addendum)
Important Message  Patient Details  Name: Alan Everett MRN: [id number] Date of Birth: [birth year]   Medicare Important Message Given:  N/A - LOS <3 / Initial given by admissions  Error: Documented on wrong pt. KA    [name] 07/28/2022, 8:22 AM

## 2022-07-28 NOTE — Progress Notes (Signed)
Report called to [facility], LPN at [facility]. Will cont to monitor.

## 2022-07-28 NOTE — TOC Progression Note (Addendum)
Transition of Care [facility]) - Progression Note    Patient Details  Name: Alan Everett MRN: [id number] Date of Birth: October 03, 1955  [facility] of [facility] CM/SW Contact  Liliana Cline, LCSW Phone Number: 07/28/2022, 10:25 AM  Clinical Narrative:    Per Watt Climes with [facility], they have spoken with family and patient can return to [facility] today.  Attempted call to Tammy at [facility], left VM.  Called Gina at [facility] who states she will check to confirm patient can come back today. Care Team updated, will continue to follow.   10:33- Tammy with [facility] confirmed patient can come back today with Hospice, room 505A.    Expected Discharge Plan: Skilled Nursing Facility Barriers to Discharge: Continued Medical Work up  Expected Discharge Plan and Services     Post Acute Care Choice: Resumption of Svcs/PTA Provider Living arrangements for the past 2 months: Skilled Nursing Facility                                       Social Determinants of Health (SDOH) Interventions SDOH Screenings   Tobacco Use: Medium Risk (07/26/2022)    Readmission Risk Interventions     No data to display

## 2022-07-28 NOTE — Progress Notes (Signed)
[facility]) Hospital Liaison Note  Chart and patient information under review by [facility] physician.   Spoke with [name] to initiate education related to hospice philosophy, services, and team approach to care. She  verbalized understanding of information given. Per discussion, the plan is for patient to discharge via [facility] once cleared to DC.    Please send signed and completed DNR home with patient/family. Please provide prescriptions at discharge as needed to ensure ongoing symptom management.    [facility] information and contact numbers given to family & above information shared with TOC.   Please call with any questions/concerns.    Thank you for the opportunity to participate in this patient's care.   Odette Fraction, MSW [facility] Liaison  [phone]

## 2022-07-28 NOTE — TOC Transition Note (Addendum)
Transition of Care [facility]) - CM/SW Discharge Note   Patient Details  Name: Alan Everett MRN: [id number] Date of Birth: [birth year]  Transition of Care [facility]) CM/SW Contact:  Liliana Cline, LCSW Phone Number: 07/28/2022, 11:15 AM   Clinical Narrative:    Discharge to [facility] today. Room 505A.  Confirmed with Admissions Worker Tammy. Updated MD, RN. Family updated by Watt Climes with [facility].  Asked RN to call report. EMS paperwork completed. Will call for [facility] when patient is ready for transport.   1:20- [facility] called for transport. Patient is 4th on the list. RN notified.    Final next level of care: Long Term Nursing Home Barriers to Discharge: Barriers Resolved   Patient Goals and CMS Choice CMS Medicare.gov Compare Post Acute Care list provided to:: Patient Represenative (must comment) Choice offered to / list presented to : [facility] POA / Guardian  Discharge Placement                  Patient to be transferred to facility by: [facility] Name of family member notified: family notified by [place] with [facility] Patient and family notified of of transfer: 07/28/22  Discharge Plan and Services Additional resources added to the After Visit Summary for       Post Acute Care Choice: Resumption of Svcs/PTA Provider                               Social Determinants of Health (SDOH) Interventions SDOH Screenings   Tobacco Use: Medium Risk (07/26/2022)     Readmission Risk Interventions     No data to display

## 2022-07-29 LAB — URINE CULTURE: Culture: 90000 — AB

## 2022-07-31 LAB — CULTURE, BLOOD (ROUTINE X 2)
Culture: NO GROWTH
Culture: NO GROWTH
Special Requests: ADEQUATE
Special Requests: ADEQUATE

## 2022-08-31 DEATH — deceased
